# Patient Record
Sex: Female | Born: 1935 | Race: White | Hispanic: No | Smoking: Never smoker
Health system: Southern US, Community
[De-identification: ages and names within clinical notes are randomized; demographics above are authoritative.]

## PROBLEM LIST (undated history)

## (undated) DIAGNOSIS — S32010A Wedge compression fracture of first lumbar vertebra, initial encounter for closed fracture: Secondary | ICD-10-CM

## (undated) DIAGNOSIS — I499 Cardiac arrhythmia, unspecified: Secondary | ICD-10-CM

## (undated) DIAGNOSIS — Z9289 Personal history of other medical treatment: Secondary | ICD-10-CM

## (undated) DIAGNOSIS — I7121 Aneurysm of the ascending aorta, without rupture: Secondary | ICD-10-CM

## (undated) DIAGNOSIS — Z8543 Personal history of malignant neoplasm of ovary: Secondary | ICD-10-CM

## (undated) DIAGNOSIS — G4700 Insomnia, unspecified: Secondary | ICD-10-CM

## (undated) DIAGNOSIS — K219 Gastro-esophageal reflux disease without esophagitis: Secondary | ICD-10-CM

## (undated) DIAGNOSIS — S22009A Unspecified fracture of unspecified thoracic vertebra, initial encounter for closed fracture: Secondary | ICD-10-CM

## (undated) DIAGNOSIS — R030 Elevated blood-pressure reading, without diagnosis of hypertension: Secondary | ICD-10-CM

## (undated) DIAGNOSIS — Z9889 Other specified postprocedural states: Secondary | ICD-10-CM

## (undated) DIAGNOSIS — M1990 Unspecified osteoarthritis, unspecified site: Secondary | ICD-10-CM

## (undated) DIAGNOSIS — F419 Anxiety disorder, unspecified: Secondary | ICD-10-CM

## (undated) DIAGNOSIS — I7300 Raynaud's syndrome without gangrene: Secondary | ICD-10-CM

## (undated) DIAGNOSIS — Z1379 Encounter for other screening for genetic and chromosomal anomalies: Secondary | ICD-10-CM

## (undated) DIAGNOSIS — Z0389 Encounter for observation for other suspected diseases and conditions ruled out: Secondary | ICD-10-CM

## (undated) DIAGNOSIS — IMO0001 Reserved for inherently not codable concepts without codable children: Secondary | ICD-10-CM

## (undated) DIAGNOSIS — I712 Thoracic aortic aneurysm, without rupture: Secondary | ICD-10-CM

## (undated) DIAGNOSIS — C801 Malignant (primary) neoplasm, unspecified: Secondary | ICD-10-CM

## (undated) DIAGNOSIS — C50919 Malignant neoplasm of unspecified site of unspecified female breast: Secondary | ICD-10-CM

## (undated) HISTORY — PX: BREAST LUMPECTOMY: SHX2

## (undated) HISTORY — DX: Encounter for other screening for genetic and chromosomal anomalies: Z13.79

## (undated) HISTORY — DX: Unspecified fracture of unspecified thoracic vertebra, initial encounter for closed fracture: S22.009A

## (undated) HISTORY — DX: Other specified postprocedural states: Z98.890

## (undated) HISTORY — DX: Aneurysm of the ascending aorta, without rupture: I71.21

## (undated) HISTORY — DX: Insomnia, unspecified: G47.00

## (undated) HISTORY — DX: Gastro-esophageal reflux disease without esophagitis: K21.9

## (undated) HISTORY — DX: Personal history of other medical treatment: Z92.89

## (undated) HISTORY — PX: APPENDECTOMY: SHX54

## (undated) HISTORY — PX: ABDOMINAL HYSTERECTOMY: SHX81

## (undated) HISTORY — DX: Malignant (primary) neoplasm, unspecified: C80.1

## (undated) HISTORY — DX: Personal history of malignant neoplasm of ovary: Z85.43

## (undated) HISTORY — DX: Wedge compression fracture of first lumbar vertebra, initial encounter for closed fracture: S32.010A

## (undated) HISTORY — DX: Raynaud's syndrome without gangrene: I73.00

## (undated) HISTORY — PX: BILATERAL SALPINGOOPHORECTOMY: SHX1223

## (undated) HISTORY — DX: Thoracic aortic aneurysm, without rupture: I71.2

## (undated) HISTORY — DX: Cardiac arrhythmia, unspecified: I49.9

## (undated) HISTORY — DX: Unspecified osteoarthritis, unspecified site: M19.90

---

## 1977-09-22 DIAGNOSIS — Z8543 Personal history of malignant neoplasm of ovary: Secondary | ICD-10-CM

## 1977-09-22 HISTORY — DX: Personal history of malignant neoplasm of ovary: Z85.43

## 1977-09-22 HISTORY — PX: OTHER SURGICAL HISTORY: SHX169

## 1978-09-22 DIAGNOSIS — C801 Malignant (primary) neoplasm, unspecified: Secondary | ICD-10-CM

## 1978-09-22 HISTORY — DX: Malignant (primary) neoplasm, unspecified: C80.1

## 1997-12-12 ENCOUNTER — Other Ambulatory Visit: Admission: RE | Admit: 1997-12-12 | Discharge: 1997-12-12 | Payer: Self-pay | Admitting: *Deleted

## 1998-09-04 ENCOUNTER — Other Ambulatory Visit: Admission: RE | Admit: 1998-09-04 | Discharge: 1998-09-04 | Payer: Self-pay | Admitting: *Deleted

## 1998-10-04 ENCOUNTER — Ambulatory Visit (HOSPITAL_COMMUNITY): Admission: RE | Admit: 1998-10-04 | Discharge: 1998-10-04 | Payer: Self-pay | Admitting: Internal Medicine

## 1999-08-29 ENCOUNTER — Other Ambulatory Visit: Admission: RE | Admit: 1999-08-29 | Discharge: 1999-08-29 | Payer: Self-pay | Admitting: *Deleted

## 2000-07-08 ENCOUNTER — Encounter (INDEPENDENT_AMBULATORY_CARE_PROVIDER_SITE_OTHER): Payer: Self-pay | Admitting: Specialist

## 2000-07-08 ENCOUNTER — Other Ambulatory Visit: Admission: RE | Admit: 2000-07-08 | Discharge: 2000-07-08 | Payer: Self-pay | Admitting: Gastroenterology

## 2002-11-16 ENCOUNTER — Other Ambulatory Visit: Admission: RE | Admit: 2002-11-16 | Discharge: 2002-11-16 | Payer: Self-pay | Admitting: Obstetrics and Gynecology

## 2003-11-22 ENCOUNTER — Other Ambulatory Visit: Admission: RE | Admit: 2003-11-22 | Discharge: 2003-11-22 | Payer: Self-pay | Admitting: Obstetrics and Gynecology

## 2003-12-25 ENCOUNTER — Ambulatory Visit (HOSPITAL_COMMUNITY): Admission: RE | Admit: 2003-12-25 | Discharge: 2003-12-25 | Payer: Self-pay | Admitting: Obstetrics and Gynecology

## 2004-02-02 ENCOUNTER — Encounter: Admission: RE | Admit: 2004-02-02 | Discharge: 2004-02-02 | Payer: Self-pay | Admitting: Obstetrics and Gynecology

## 2004-08-13 ENCOUNTER — Ambulatory Visit: Payer: Self-pay | Admitting: Internal Medicine

## 2004-09-05 ENCOUNTER — Observation Stay (HOSPITAL_COMMUNITY): Admission: EM | Admit: 2004-09-05 | Discharge: 2004-09-06 | Payer: Self-pay | Admitting: Emergency Medicine

## 2004-09-05 ENCOUNTER — Ambulatory Visit: Payer: Self-pay | Admitting: Internal Medicine

## 2004-09-11 ENCOUNTER — Encounter: Payer: Self-pay | Admitting: Internal Medicine

## 2004-09-11 ENCOUNTER — Ambulatory Visit: Payer: Self-pay

## 2004-10-30 ENCOUNTER — Encounter: Admission: RE | Admit: 2004-10-30 | Discharge: 2004-10-30 | Payer: Self-pay | Admitting: Gastroenterology

## 2004-11-21 ENCOUNTER — Other Ambulatory Visit: Admission: RE | Admit: 2004-11-21 | Discharge: 2004-11-21 | Payer: Self-pay | Admitting: Obstetrics and Gynecology

## 2004-12-03 ENCOUNTER — Ambulatory Visit: Payer: Self-pay | Admitting: Internal Medicine

## 2005-01-02 ENCOUNTER — Ambulatory Visit: Payer: Self-pay | Admitting: Internal Medicine

## 2005-01-08 ENCOUNTER — Ambulatory Visit: Payer: Self-pay | Admitting: Internal Medicine

## 2005-01-24 ENCOUNTER — Ambulatory Visit: Payer: Self-pay | Admitting: Internal Medicine

## 2005-03-14 ENCOUNTER — Encounter: Admission: RE | Admit: 2005-03-14 | Discharge: 2005-03-14 | Payer: Self-pay | Admitting: Internal Medicine

## 2005-07-26 ENCOUNTER — Ambulatory Visit: Payer: Self-pay | Admitting: Internal Medicine

## 2005-10-01 ENCOUNTER — Ambulatory Visit: Payer: Self-pay | Admitting: Internal Medicine

## 2005-10-21 ENCOUNTER — Encounter: Admission: RE | Admit: 2005-10-21 | Discharge: 2005-10-21 | Payer: Self-pay | Admitting: Obstetrics and Gynecology

## 2005-10-30 ENCOUNTER — Ambulatory Visit: Payer: Self-pay | Admitting: Internal Medicine

## 2005-11-24 ENCOUNTER — Other Ambulatory Visit: Admission: RE | Admit: 2005-11-24 | Discharge: 2005-11-24 | Payer: Self-pay | Admitting: Obstetrics and Gynecology

## 2005-12-04 ENCOUNTER — Ambulatory Visit: Payer: Self-pay | Admitting: Internal Medicine

## 2005-12-17 ENCOUNTER — Ambulatory Visit: Payer: Self-pay | Admitting: Internal Medicine

## 2005-12-17 ENCOUNTER — Encounter: Admission: RE | Admit: 2005-12-17 | Discharge: 2005-12-17 | Payer: Self-pay | Admitting: Internal Medicine

## 2006-03-03 ENCOUNTER — Ambulatory Visit: Payer: Self-pay | Admitting: Internal Medicine

## 2006-06-02 ENCOUNTER — Encounter: Admission: RE | Admit: 2006-06-02 | Discharge: 2006-06-02 | Payer: Self-pay | Admitting: Obstetrics and Gynecology

## 2006-06-24 ENCOUNTER — Ambulatory Visit: Payer: Self-pay | Admitting: Internal Medicine

## 2006-10-01 ENCOUNTER — Ambulatory Visit: Payer: Self-pay | Admitting: Internal Medicine

## 2006-10-13 ENCOUNTER — Ambulatory Visit: Payer: Self-pay | Admitting: Internal Medicine

## 2006-10-14 ENCOUNTER — Ambulatory Visit: Payer: Self-pay | Admitting: Cardiology

## 2006-10-23 ENCOUNTER — Encounter: Admission: RE | Admit: 2006-10-23 | Discharge: 2006-10-23 | Payer: Self-pay | Admitting: Obstetrics and Gynecology

## 2006-11-02 ENCOUNTER — Ambulatory Visit: Payer: Self-pay | Admitting: Internal Medicine

## 2006-11-11 ENCOUNTER — Ambulatory Visit (HOSPITAL_COMMUNITY): Admission: RE | Admit: 2006-11-11 | Discharge: 2006-11-11 | Payer: Self-pay | Admitting: Internal Medicine

## 2006-11-18 ENCOUNTER — Ambulatory Visit (HOSPITAL_COMMUNITY): Admission: RE | Admit: 2006-11-18 | Discharge: 2006-11-18 | Payer: Self-pay | Admitting: Internal Medicine

## 2006-12-31 ENCOUNTER — Ambulatory Visit: Payer: Self-pay | Admitting: Internal Medicine

## 2006-12-31 LAB — CONVERTED CEMR LAB
Albumin: 3.8 g/dL (ref 3.5–5.2)
Alkaline Phosphatase: 50 units/L (ref 39–117)
BUN: 9 mg/dL (ref 6–23)
Bilirubin, Direct: 0.1 mg/dL (ref 0.0–0.3)
CO2: 33 meq/L — ABNORMAL HIGH (ref 19–32)
Calcium: 9.5 mg/dL (ref 8.4–10.5)
Creatinine, Ser: 0.5 mg/dL (ref 0.4–1.2)
GFR calc Af Amer: 156 mL/min
Total Bilirubin: 0.9 mg/dL (ref 0.3–1.2)
Total CHOL/HDL Ratio: 2.9
VLDL: 13 mg/dL (ref 0–40)

## 2007-01-20 ENCOUNTER — Ambulatory Visit: Payer: Self-pay | Admitting: Internal Medicine

## 2007-02-02 ENCOUNTER — Ambulatory Visit: Payer: Self-pay | Admitting: Internal Medicine

## 2007-02-02 LAB — CONVERTED CEMR LAB
Anti Nuclear Antibody(ANA): POSITIVE — AB
CRP, High Sensitivity: <1 — ABNORMAL LOW (ref 0.00–5.00)
Cyclic Citrullin Peptide Ab: 3 units (ref <20)
Rhuematoid fact SerPl-aCnc: <20 intl units/mL — ABNORMAL LOW (ref 0.0–20.0)

## 2007-02-04 ENCOUNTER — Ambulatory Visit: Payer: Self-pay

## 2007-02-04 ENCOUNTER — Encounter: Payer: Self-pay | Admitting: Cardiovascular Disease

## 2007-02-09 ENCOUNTER — Ambulatory Visit: Payer: Self-pay | Admitting: Surgery

## 2007-03-04 ENCOUNTER — Ambulatory Visit: Payer: Self-pay | Admitting: Internal Medicine

## 2007-03-18 DIAGNOSIS — M810 Age-related osteoporosis without current pathological fracture: Secondary | ICD-10-CM

## 2007-04-13 ENCOUNTER — Ambulatory Visit: Payer: Self-pay | Admitting: Internal Medicine

## 2007-04-13 LAB — CONVERTED CEMR LAB: IgE (Immunoglobulin E), Serum: 13.7 intl units/mL (ref 0.0–180.0)

## 2007-05-05 ENCOUNTER — Ambulatory Visit: Payer: Self-pay | Admitting: Internal Medicine

## 2007-05-06 ENCOUNTER — Ambulatory Visit: Payer: Self-pay | Admitting: Internal Medicine

## 2007-05-06 DIAGNOSIS — K219 Gastro-esophageal reflux disease without esophagitis: Secondary | ICD-10-CM

## 2007-05-06 DIAGNOSIS — I712 Thoracic aortic aneurysm, without rupture: Secondary | ICD-10-CM

## 2007-05-06 DIAGNOSIS — J309 Allergic rhinitis, unspecified: Secondary | ICD-10-CM | POA: Insufficient documentation

## 2007-05-06 DIAGNOSIS — M5030 Other cervical disc degeneration, unspecified cervical region: Secondary | ICD-10-CM

## 2007-05-11 ENCOUNTER — Encounter: Payer: Self-pay | Admitting: Internal Medicine

## 2007-07-01 ENCOUNTER — Ambulatory Visit: Payer: Self-pay | Admitting: Internal Medicine

## 2007-07-01 DIAGNOSIS — I7300 Raynaud's syndrome without gangrene: Secondary | ICD-10-CM | POA: Insufficient documentation

## 2007-07-16 ENCOUNTER — Telehealth (INDEPENDENT_AMBULATORY_CARE_PROVIDER_SITE_OTHER): Payer: Self-pay | Admitting: *Deleted

## 2007-07-19 ENCOUNTER — Encounter: Payer: Self-pay | Admitting: Internal Medicine

## 2007-07-27 ENCOUNTER — Ambulatory Visit: Payer: Self-pay | Admitting: Internal Medicine

## 2007-09-08 ENCOUNTER — Telehealth: Payer: Self-pay | Admitting: Internal Medicine

## 2007-09-27 ENCOUNTER — Ambulatory Visit: Payer: Self-pay | Admitting: Internal Medicine

## 2007-09-27 DIAGNOSIS — R0602 Shortness of breath: Secondary | ICD-10-CM | POA: Insufficient documentation

## 2007-09-27 LAB — CONVERTED CEMR LAB
Albumin: 3.9 g/dL (ref 3.5–5.2)
CO2: 30 meq/L (ref 19–32)
GFR calc Af Amer: 106 mL/min
GFR calc non Af Amer: 88 mL/min
Glucose, Bld: 91 mg/dL (ref 70–99)
Sodium: 140 meq/L (ref 135–145)

## 2007-10-07 ENCOUNTER — Ambulatory Visit: Payer: Self-pay | Admitting: Internal Medicine

## 2007-10-12 ENCOUNTER — Encounter: Payer: Self-pay | Admitting: Internal Medicine

## 2007-10-27 ENCOUNTER — Encounter: Payer: Self-pay | Admitting: Internal Medicine

## 2007-11-05 ENCOUNTER — Ambulatory Visit: Payer: Self-pay | Admitting: Internal Medicine

## 2007-11-05 ENCOUNTER — Telehealth: Payer: Self-pay | Admitting: Internal Medicine

## 2007-11-05 DIAGNOSIS — R079 Chest pain, unspecified: Secondary | ICD-10-CM

## 2007-11-18 ENCOUNTER — Encounter: Admission: RE | Admit: 2007-11-18 | Discharge: 2007-11-18 | Payer: Self-pay | Admitting: Obstetrics and Gynecology

## 2007-11-25 ENCOUNTER — Ambulatory Visit: Payer: Self-pay | Admitting: Internal Medicine

## 2007-11-25 DIAGNOSIS — R911 Solitary pulmonary nodule: Secondary | ICD-10-CM

## 2007-11-29 ENCOUNTER — Ambulatory Visit: Payer: Self-pay | Admitting: Internal Medicine

## 2007-12-07 ENCOUNTER — Other Ambulatory Visit: Admission: RE | Admit: 2007-12-07 | Discharge: 2007-12-07 | Payer: Self-pay | Admitting: Obstetrics and Gynecology

## 2007-12-21 ENCOUNTER — Encounter: Payer: Self-pay | Admitting: Internal Medicine

## 2007-12-21 ENCOUNTER — Ambulatory Visit: Payer: Self-pay | Admitting: Surgery

## 2008-01-27 ENCOUNTER — Ambulatory Visit: Payer: Self-pay | Admitting: Internal Medicine

## 2008-05-22 ENCOUNTER — Encounter: Payer: Self-pay | Admitting: Internal Medicine

## 2008-06-01 ENCOUNTER — Ambulatory Visit: Payer: Self-pay | Admitting: Internal Medicine

## 2008-06-01 ENCOUNTER — Telehealth: Payer: Self-pay | Admitting: Internal Medicine

## 2008-06-01 DIAGNOSIS — R300 Dysuria: Secondary | ICD-10-CM

## 2008-06-01 LAB — CONVERTED CEMR LAB
Bilirubin Urine: NEGATIVE
Ketones, urine, test strip: NEGATIVE
Nitrite: NEGATIVE
Specific Gravity, Urine: 1.015
Urobilinogen, UA: 0.2
pH: 7.5

## 2008-06-06 ENCOUNTER — Ambulatory Visit: Payer: Self-pay | Admitting: Internal Medicine

## 2008-06-06 LAB — CONVERTED CEMR LAB
ALT: 20 U/L
AST: 28 U/L
Albumin: 4.2 g/dL
Alkaline Phosphatase: 49 U/L
BUN: 13 mg/dL
Basophils Absolute: 0 K/uL
Basophils Relative: 0.8 %
Bilirubin, Direct: 0.1 mg/dL
CO2: 30 meq/L
Calcium: 9.6 mg/dL
Chloride: 109 meq/L
Cholesterol: 192 mg/dL
Creatinine, Ser: 0.8 mg/dL
Eosinophils Absolute: 0.2 K/uL
Eosinophils Relative: 3.8 %
GFR calc Af Amer: 91 mL/min
GFR calc non Af Amer: 75 mL/min
Glucose, Bld: 96 mg/dL
HCT: 40 %
HDL: 69.3 mg/dL
Hemoglobin: 13.5 g/dL
LDL Cholesterol: 115 mg/dL — ABNORMAL HIGH
Lymphocytes Relative: 35.8 %
MCHC: 33.7 g/dL
MCV: 85.6 fL
Monocytes Absolute: 0.5 K/uL
Monocytes Relative: 11 %
Neutro Abs: 2.1 K/uL
Neutrophils Relative %: 48.6 %
Platelets: 217 K/uL
Potassium: 4.7 meq/L
RBC: 4.67 M/uL
RDW: 12.8 %
Sodium: 139 meq/L
TSH: 1.01 u[IU]/mL
Total Bilirubin: 0.9 mg/dL
Total CHOL/HDL Ratio: 2.8
Total Protein: 7.6 g/dL
Triglycerides: 41 mg/dL
VLDL: 8 mg/dL
WBC: 4.4 10*3/microliter — ABNORMAL LOW

## 2008-06-12 ENCOUNTER — Ambulatory Visit: Payer: Self-pay | Admitting: Cardiovascular Disease

## 2008-06-27 ENCOUNTER — Ambulatory Visit: Payer: Self-pay | Admitting: Internal Medicine

## 2008-08-24 ENCOUNTER — Ambulatory Visit: Payer: Self-pay | Admitting: Internal Medicine

## 2008-08-24 DIAGNOSIS — B009 Herpesviral infection, unspecified: Secondary | ICD-10-CM | POA: Insufficient documentation

## 2008-09-21 ENCOUNTER — Telehealth: Payer: Self-pay | Admitting: *Deleted

## 2008-10-13 ENCOUNTER — Ambulatory Visit: Payer: Self-pay | Admitting: Internal Medicine

## 2008-10-13 DIAGNOSIS — K589 Irritable bowel syndrome without diarrhea: Secondary | ICD-10-CM | POA: Insufficient documentation

## 2008-10-13 DIAGNOSIS — F411 Generalized anxiety disorder: Secondary | ICD-10-CM

## 2008-10-13 LAB — CONVERTED CEMR LAB
ALT: 18 units/L (ref 0–35)
Alkaline Phosphatase: 50 units/L (ref 39–117)
Basophils Absolute: 0 10*3/uL (ref 0.0–0.1)
Eosinophils Relative: 3.8 % (ref 0.0–5.0)
Lipase: 42 units/L (ref 11.0–59.0)
MCHC: 33.6 g/dL (ref 30.0–36.0)
Monocytes Absolute: 0.6 10*3/uL (ref 0.1–1.0)
Neutrophils Relative %: 54.4 % (ref 43.0–77.0)
Platelets: 199 10*3/uL (ref 150–400)
RBC: 4.56 M/uL (ref 3.87–5.11)
RDW: 13 % (ref 11.5–14.6)
WBC: 5.7 10*3/uL (ref 4.5–10.5)

## 2008-11-10 ENCOUNTER — Telehealth: Payer: Self-pay | Admitting: Internal Medicine

## 2008-11-14 ENCOUNTER — Telehealth: Payer: Self-pay | Admitting: Internal Medicine

## 2008-11-14 ENCOUNTER — Ambulatory Visit: Payer: Self-pay | Admitting: Internal Medicine

## 2008-11-20 ENCOUNTER — Encounter: Admission: RE | Admit: 2008-11-20 | Discharge: 2008-11-20 | Payer: Self-pay | Admitting: Obstetrics and Gynecology

## 2008-12-04 ENCOUNTER — Encounter: Admission: RE | Admit: 2008-12-04 | Discharge: 2008-12-04 | Payer: Self-pay | Admitting: Surgery

## 2008-12-05 ENCOUNTER — Encounter: Payer: Self-pay | Admitting: Internal Medicine

## 2008-12-05 ENCOUNTER — Ambulatory Visit: Payer: Self-pay | Admitting: Surgery

## 2009-01-11 ENCOUNTER — Ambulatory Visit: Payer: Self-pay | Admitting: Internal Medicine

## 2009-02-02 ENCOUNTER — Ambulatory Visit: Payer: Self-pay | Admitting: Internal Medicine

## 2009-02-02 DIAGNOSIS — M19019 Primary osteoarthritis, unspecified shoulder: Secondary | ICD-10-CM | POA: Insufficient documentation

## 2009-02-02 LAB — CONVERTED CEMR LAB: Cyclic Citrullin Peptide Ab: 1.6 U

## 2009-02-08 ENCOUNTER — Telehealth: Payer: Self-pay | Admitting: Internal Medicine

## 2009-05-25 ENCOUNTER — Ambulatory Visit: Payer: Self-pay | Admitting: Internal Medicine

## 2009-05-25 LAB — CONVERTED CEMR LAB
ALT: 18 units/L (ref 0–35)
Albumin: 3.9 g/dL (ref 3.5–5.2)
BUN: 14 mg/dL (ref 6–23)
CO2: 31 meq/L (ref 19–32)
Calcium: 9.6 mg/dL (ref 8.4–10.5)
Chloride: 104 meq/L (ref 96–112)
Cholesterol: 198 mg/dL (ref 0–200)
Creatinine, Ser: 0.7 mg/dL (ref 0.4–1.2)
Eosinophils Relative: 3.8 % (ref 0.0–5.0)
Glucose, Bld: 86 mg/dL (ref 70–99)
HDL: 67.6 mg/dL (ref >39.00)
LDL Cholesterol: 125 mg/dL — ABNORMAL HIGH (ref 0–99)
Lymphocytes Relative: 40.4 % (ref 12.0–46.0)
MCV: 86.9 fL (ref 78.0–100.0)
Monocytes Absolute: 0.4 10*3/uL (ref 0.1–1.0)
Neutrophils Relative %: 44.8 % (ref 43.0–77.0)
Nitrite: NEGATIVE
Platelets: 189 10*3/uL (ref 150.0–400.0)
Protein, U semiquant: NEGATIVE
TSH: 1.28 microintl units/mL (ref 0.35–5.50)
Total Protein: 7 g/dL (ref 6.0–8.3)
Triglycerides: 29 mg/dL (ref 0.0–149.0)
WBC Urine, dipstick: NEGATIVE
WBC: 4.1 10*3/uL — ABNORMAL LOW (ref 4.5–10.5)

## 2009-06-05 ENCOUNTER — Ambulatory Visit: Payer: Self-pay | Admitting: Internal Medicine

## 2009-07-11 ENCOUNTER — Ambulatory Visit: Payer: Self-pay | Admitting: Internal Medicine

## 2009-09-05 ENCOUNTER — Ambulatory Visit: Payer: Self-pay | Admitting: Internal Medicine

## 2009-09-05 DIAGNOSIS — M1710 Unilateral primary osteoarthritis, unspecified knee: Secondary | ICD-10-CM

## 2009-09-06 ENCOUNTER — Encounter: Admission: RE | Admit: 2009-09-06 | Discharge: 2009-09-06 | Payer: Self-pay | Admitting: Obstetrics and Gynecology

## 2009-11-21 ENCOUNTER — Encounter: Admission: RE | Admit: 2009-11-21 | Discharge: 2009-11-21 | Payer: Self-pay | Admitting: Obstetrics and Gynecology

## 2009-11-22 ENCOUNTER — Telehealth (INDEPENDENT_AMBULATORY_CARE_PROVIDER_SITE_OTHER): Payer: Self-pay | Admitting: *Deleted

## 2009-11-23 ENCOUNTER — Ambulatory Visit: Payer: Self-pay | Admitting: Internal Medicine

## 2009-11-23 LAB — CONVERTED CEMR LAB
Bilirubin Urine: NEGATIVE
Ketones, urine, test strip: NEGATIVE
Protein, U semiquant: NEGATIVE
Urobilinogen, UA: 0.2

## 2009-12-05 ENCOUNTER — Ambulatory Visit (HOSPITAL_COMMUNITY): Admission: RE | Admit: 2009-12-05 | Discharge: 2009-12-05 | Payer: Self-pay | Admitting: Obstetrics and Gynecology

## 2009-12-13 ENCOUNTER — Ambulatory Visit: Payer: Self-pay | Admitting: Internal Medicine

## 2009-12-13 DIAGNOSIS — R5381 Other malaise: Secondary | ICD-10-CM | POA: Insufficient documentation

## 2009-12-13 DIAGNOSIS — R5383 Other fatigue: Secondary | ICD-10-CM

## 2009-12-13 LAB — CONVERTED CEMR LAB
Basophils Relative: 0.3 % (ref 0.0–3.0)
Eosinophils Relative: 5.5 % — ABNORMAL HIGH (ref 0.0–5.0)
HCT: 38.8 % (ref 36.0–46.0)
Lymphs Abs: 1.9 10*3/uL (ref 0.7–4.0)
MCV: 88.4 fL (ref 78.0–100.0)
Monocytes Absolute: 0.6 10*3/uL (ref 0.1–1.0)
RBC: 4.39 M/uL (ref 3.87–5.11)
WBC: 6.5 10*3/uL (ref 4.5–10.5)

## 2009-12-18 ENCOUNTER — Other Ambulatory Visit: Admission: RE | Admit: 2009-12-18 | Discharge: 2009-12-18 | Payer: Self-pay | Admitting: Obstetrics and Gynecology

## 2010-01-18 ENCOUNTER — Ambulatory Visit: Payer: Self-pay | Admitting: Internal Medicine

## 2010-01-18 DIAGNOSIS — M542 Cervicalgia: Secondary | ICD-10-CM

## 2010-01-24 ENCOUNTER — Encounter: Admission: RE | Admit: 2010-01-24 | Discharge: 2010-01-24 | Payer: Self-pay | Admitting: Internal Medicine

## 2010-01-24 ENCOUNTER — Encounter: Payer: Self-pay | Admitting: Internal Medicine

## 2010-06-27 ENCOUNTER — Telehealth: Payer: Self-pay | Admitting: Internal Medicine

## 2010-06-28 ENCOUNTER — Ambulatory Visit: Payer: Self-pay | Admitting: Internal Medicine

## 2010-06-28 DIAGNOSIS — M545 Low back pain: Secondary | ICD-10-CM | POA: Insufficient documentation

## 2010-06-28 DIAGNOSIS — R109 Unspecified abdominal pain: Secondary | ICD-10-CM | POA: Insufficient documentation

## 2010-06-28 LAB — CONVERTED CEMR LAB
Bilirubin Urine: NEGATIVE
Ketones, urine, test strip: NEGATIVE
Protein, U semiquant: NEGATIVE
Urobilinogen, UA: 0.2

## 2010-07-01 ENCOUNTER — Telehealth: Payer: Self-pay | Admitting: Internal Medicine

## 2010-07-02 ENCOUNTER — Ambulatory Visit: Payer: Self-pay | Admitting: Internal Medicine

## 2010-07-02 LAB — CONVERTED CEMR LAB
CO2: 31 meq/L (ref 19–32)
GFR calc non Af Amer: 82.71 mL/min (ref >60)
Glucose, Bld: 105 mg/dL — ABNORMAL HIGH (ref 70–99)
Potassium: 4.4 meq/L (ref 3.5–5.1)
Sodium: 132 meq/L — ABNORMAL LOW (ref 135–145)

## 2010-07-03 ENCOUNTER — Ambulatory Visit: Payer: Self-pay | Admitting: Cardiovascular Disease

## 2010-07-04 ENCOUNTER — Telehealth: Payer: Self-pay | Admitting: Internal Medicine

## 2010-07-24 ENCOUNTER — Ambulatory Visit: Payer: Self-pay | Admitting: Internal Medicine

## 2010-07-24 DIAGNOSIS — S22009A Unspecified fracture of unspecified thoracic vertebra, initial encounter for closed fracture: Secondary | ICD-10-CM

## 2010-07-24 HISTORY — DX: Unspecified fracture of unspecified thoracic vertebra, initial encounter for closed fracture: S22.009A

## 2010-07-24 LAB — CONVERTED CEMR LAB
Basophils Absolute: 0 10*3/uL (ref 0.0–0.1)
Basophils Relative: 0.8 % (ref 0.0–3.0)
HCT: 39.9 % (ref 36.0–46.0)
Hemoglobin: 13.4 g/dL (ref 12.0–15.0)
Lymphs Abs: 1.7 10*3/uL (ref 0.7–4.0)
Monocytes Relative: 9.1 % (ref 3.0–12.0)
Neutro Abs: 2.1 10*3/uL (ref 1.4–7.7)
RDW: 14.6 % (ref 11.5–14.6)

## 2010-07-30 ENCOUNTER — Encounter: Payer: Self-pay | Admitting: Internal Medicine

## 2010-07-30 ENCOUNTER — Encounter
Admission: RE | Admit: 2010-07-30 | Discharge: 2010-09-04 | Payer: Self-pay | Source: Home / Self Care | Attending: Internal Medicine | Admitting: Internal Medicine

## 2010-08-13 ENCOUNTER — Encounter: Payer: Self-pay | Admitting: Internal Medicine

## 2010-09-27 ENCOUNTER — Ambulatory Visit: Admit: 2010-09-27 | Payer: Self-pay | Admitting: Internal Medicine

## 2010-10-01 ENCOUNTER — Encounter
Admission: RE | Admit: 2010-10-01 | Discharge: 2010-10-01 | Payer: Self-pay | Source: Home / Self Care | Attending: Obstetrics and Gynecology | Admitting: Obstetrics and Gynecology

## 2010-10-12 ENCOUNTER — Other Ambulatory Visit: Payer: Self-pay | Admitting: Obstetrics and Gynecology

## 2010-10-12 DIAGNOSIS — Z1239 Encounter for other screening for malignant neoplasm of breast: Secondary | ICD-10-CM

## 2010-10-20 LAB — CONVERTED CEMR LAB
Basophils Absolute: 0 10*3/uL (ref 0.0–0.1)
Basophils Relative: 0.7 % (ref 0.0–1.0)
CO2: 31 meq/L (ref 19–32)
Calcium: 10.1 mg/dL (ref 8.4–10.5)
Chloride: 101 meq/L (ref 96–112)
Eosinophils Absolute: 0.2 10*3/uL (ref 0.0–0.6)
GFR calc non Af Amer: 105 mL/min
Glucose, Bld: 92 mg/dL (ref 70–99)
Hemoglobin: 13 g/dL (ref 12.0–15.0)
MCHC: 33 g/dL (ref 30.0–36.0)
Neutro Abs: 2.6 10*3/uL (ref 1.4–7.7)
Neutrophils Relative %: 49.7 % (ref 43.0–77.0)
Total CK: 81 units/L (ref 7–177)
WBC: 5.2 10*3/uL (ref 4.5–10.5)

## 2010-10-22 NOTE — Assessment & Plan Note (Signed)
Summary: has burning sensation in mouth and lips/cjr   Vital Signs:  Patient profile:   77 year old female Weight:      146 pounds Temp:     98.2 degrees F oral Pulse rate:   74 / minute BP sitting:   118 / 78  (left arm) Cuff size:   regular  Vitals Entered By: Kern Reap CMA Duncan Dull) (December 13, 2009 12:18 PM) CC: lips, front teeth burning, troubles with breathing, sinus drainage Is Patient Diabetic? No   Primary Care Provider:  Terrilee Files  CC:  lips, front teeth burning, troubles with breathing, and sinus drainage.  History of Present Illness: Has a long hx of buring in mouth and lips has seen the dentist and was evaluated and he could  not find any problem feels fatigues Has seen the ENT ( [facility]) and she was place on an antibiotic and maxifed and it did not help she was then given avelox  and gave a probiotic the ENT suggested zyrtec has been on the zyrtec several days and it helps  Problems Prior to Update: 1)  Osteoarthrosis Unspec Whether Gen/loc Lower Leg  (ICD-715.96) 2)  Osteoarthros Unspec Whether Gen/loc [employer] [facility]  (ICD-715.91) 3)  [place]  (ICD-465.9) 4)  Generalized Anxiety Disorder  (ICD-300.02) 5)  Abdominal or Pelvic Swelling Mass or Lump Llq  (ICD-789.34) 6)  Irritable Bowel Syndrome  (ICD-564.1) 7)  Herpes Simplex Infection  (ICD-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 Take 1 Tablet By Mouth Once A Day 2)  Nexium 40 Mg  Cpdr (Esomeprazole Magnesium) .... Take 1 Tablet By  Mouth Once A Day As Needed 3)  Multivitamin .... Take 1 Tablet By Mouth Once A Day 4)  Gnp Flax Seed Oil 1000 Mg Caps (Flaxseed (Linseed)) .Marland Kitchen.. 1 Once Daily 5)  Valtrex 500 Mg  Tabs (Valacyclovir Hcl) .... One By Mouth Daily As Needed 6)  Vitamin D 1000 Unit  Caps (Cholecalciferol) .... Once Daily 7)  Ipratropium Bromide 0.03 % Soln (Ipratropium Bromide) .... Two Spray in Nostril Two Times A Day  Prn  Current Medications (verified): 1)  Calcium/vitamin D .... Take 1 Tablet By Mouth Once A Day 2)  Nexium 40 Mg  Cpdr (Esomeprazole Magnesium) .... Take 1 Tablet By Mouth Once A Day As Needed 3)  Multivitamin .... Take 1 Tablet By Mouth Once A Day 4)  Gnp Flax Seed Oil 1000 Mg Caps (Flaxseed (Linseed)) .Marland Kitchen.. 1 Once Daily 5)  Valtrex 500 Mg  Tabs (Valacyclovir Hcl) .... One By Mouth Daily As Needed 6)  Vitamin D 1000 Unit  Caps (Cholecalciferol) .... Once Daily 7)  Ipratropium Bromide 0.03 % Soln (Ipratropium Bromide) .... Two Spray in Nostril Two Times A Day  Prn 8)  Zyrtec Allergy 10 Mg Tbdp (Cetirizine Hcl) .... One By Mouth Daily 9)  Sertraline Hcl 25 Mg Tabs (Sertraline Hcl) .... One By Mouth Daily  Allergies (verified): No Known Drug Allergies  Past History:  Family History: Last updated: 06/05/2009 Father age related issues Mother alive at  99 [facility] sister had connective tissue dz  Social History: Last updated: 03/18/2007 Married Never Smoked Retired  Risk Factors: Smoking Status: never (09/05/2009)  Past medical, surgical, family and social histories (including risk factors) reviewed, and no changes noted (except as noted below).  Past Medical History: Reviewed history from 06/06/2008 and no changes required. Osteoporosis Ascending Aortic Aneurysm hx of Ovarian CA raynauds GERD insomnia  Past Surgical History: Reviewed history from 05/04/2007 and no changes required. Ovarian CA S/P TAH/BSO-1979 Appendectomy Hysterectomy-1980 Bilateral  Salpingo-OOphorectomy-1980 Colonoscopy  Family History: Reviewed history from 06/05/2009 and no changes required. Father age related issues Mother alive at 79 [facility] sister had connective tissue dz  Social History: Reviewed history from 03/18/2007 and no changes required. Married Never Smoked Retired  Review of Systems  The patient denies anorexia, fever, weight loss, weight gain, vision loss, decreased hearing, hoarseness, chest pain, syncope, dyspnea on exertion, peripheral edema, prolonged cough, headaches, hemoptysis, abdominal pain, melena, hematochezia, severe indigestion/heartburn, hematuria, incontinence, genital sores, muscle weakness, suspicious skin lesions, transient blindness, difficulty walking, depression, unusual weight change, abnormal bleeding, enlarged lymph nodes, angioedema, and breast masses.    Physical Exam  General:  Well-developed,well-nourished,in no acute distress; alert,appropriate and cooperative throughout examination  Head:  Normocephalic and atraumatic without obvious abnormalities. No apparent alopecia or balding. Eyes:  pupils equal and pupils round.   Ears:  R ear normal and L ear normal.   Mouth:  posterior lymphoid hypertrophy and postnasal drip.   Neck:  No deformities, masses, or tenderness noted. Lungs:  normal respiratory effort and no dullness.   Heart:  normal rate and regular rhythm.   Skin:  Intact without suspicious lesions or rashes Cervical Nodes:  No lymphadenopathy noted Axillary Nodes:  No palpable lymphadenopathy Psych:  subdued and slightly anxious.     Impression & Recommendations:  Problem # 1:  ALLERGIC RHINITIS, CHRONIC (ICD-477.9)  Her updated medication list for this problem includes:    Ipratropium Bromide 0.03 % Soln (Ipratropium bromide) .Marland Kitchen..Marland Kitchen Two spray in nostril two times a day  prn    Zyrtec Allergy 10 Mg Tbdp (Cetirizine hcl) ..... One by mouth daily  Discussed use of allergy medications and  environmental measures.   Problem # 2:  FATIGUE, CHRONIC (ICD-780.79)  Orders: Venipuncture (16109) TLB-CBC Platelet - w/Differential (85025-CBCD) TLB-B12 + Folate Pnl (60454_09811-B14/NWG) TLB-CRP-High Sensitivity (C-Reactive Protein) (86140-FCRP)  Problem # 3:  GENERALIZED ANXIETY DISORDER (ICD-300.02)  Discussed medication use and relaxation techniques.   Her updated medication list for this problem includes:    Sertraline Hcl 25 Mg Tabs (Sertraline hcl) ..... One by mouth daily  Complete Medication List: 1)  Calcium/vitamin D  .... Take 1 tablet by mouth once a day 2)  Nexium 40 Mg Cpdr (Esomeprazole magnesium) .... Take 1 tablet by mouth once a day as needed 3)  Multivitamin  .... Take 1 tablet by mouth once a day 4)  Gnp Flax Seed Oil 1000 Mg Caps (Flaxseed (linseed)) .Marland Kitchen.. 1 once daily 5)  Valtrex 500 Mg Tabs (Valacyclovir hcl) .... One by mouth daily as needed 6)  Vitamin D 1000 Unit Caps (Cholecalciferol) .... Once daily 7)  Ipratropium Bromide 0.03 % Soln (Ipratropium bromide) .... Two spray in nostril two times a day  prn 8)  Zyrtec Allergy 10 Mg Tbdp (Cetirizine hcl) .... One by mouth daily 9)  Sertraline Hcl 25 Mg Tabs (Sertraline hcl) .... One by mouth daily  Patient Instructions: 1)  keep apointment Prescriptions: SERTRALINE HCL 25 MG TABS (  SERTRALINE HCL) one by mouth daily  #30 x 5   Entered and Authorized by:   Stacie Glaze MD   Signed by:   Stacie Glaze MD on 12/13/2009   Method used:   Electronically to        [facility] [phone]* (retail)       [address]       [place], [place]  [address]       Ph: [phone] or [phone]       Fax: [phone]   RxID:   [id number]

## 2010-10-22 NOTE — Assessment & Plan Note (Signed)
Summary: abd pain/bmw   Vital Signs:  Patient profile:   77 year old female Weight:      146 pounds Temp:     98.3 degrees F oral BP sitting:   110 / 70  (left arm) Cuff size:   regular  Vitals Entered By: Kern Reap CMA Duncan Dull) (June 28, 2010 3:40 PM) CC: lower abdominal and lower back pain Is Patient Diabetic? No Pain Assessment Patient in pain? yes     Location: abdomen Intensity: 7 Type: sharp Onset of pain  With activity   Primary Care Provider:  Terrilee Files  CC:  lower abdominal and lower back pain.  History of Present Illness: has increased LLQ pain started two weeks ago with back pain and now it has moved to the LLQ urine obtained Went to GYN and she examined her and saw no ovarian or pelvic mass she wanted to order a CT but she has not done this yet ( not scheduled) Burning pain, feels like the lower abdomin is swelling bowels move with magnesium glycinate 400 using OTC digestive enzymes  Preventive Screening-Counseling & Management  Alcohol-Tobacco     Smoking Status: never     Tobacco Counseling: not indicated; no tobacco use  Problems Prior to Update: 1)  Back Pain, Lumbar  (ICD-724.2) 2)  Abdominal Pain, Lower  (ICD-789.09) 3)  Neck and Back Pain  (ICD-723.1) 4)  Fatigue, Chronic  (ICD-780.79) 5)  Osteoarthrosis Unspec Whether Gen/loc Lower Leg  (ICD-715.96) 6)  Osteoarthros Unspec Whether Gen/loc [employer] [facility]  (ICD-715.91) 7)  [place]  (ICD-465.9) 8)  Generalized Anxiety Disorder  (ICD-300.02) 9)  Abdominal or Pelvic Swelling Mass or Lump Llq  (ICD-789.34) 10)  Irritable Bowel Syndrome  (ICD-564.1) 11)  Herpes Simplex Infection  (ICD-054.9) 12)  Preventive Health Care  (ICD-V70.0) 13)  Gerd  (ICD-530.81) 14)  Dysuria  (ICD-788.1) 15)  Lung Nodule  (ICD-518.89) 16)  Chest Pain  (ICD-786.50) 17)  Dyspnea  (ICD-786.05) 18)  Raynaud's Syndrome  (ICD-443.0) 19)  Degenerative Disc Disease, Cervical Spine  (ICD-722.4) 20)  Aneurysm, Thoracic  Aortic  (ICD-441.2) 21)  Gastroesophageal Reflux Disease, Chronic  (ICD-530.81) 22)  Allergic Rhinitis, Chronic  (ICD-477.9) 23)  Osteoporosis  (ICD-733.00)  Medications Prior to Update: 1)  Calcium/vitamin D .... Take 1 Tablet By Mouth Once A Day 2)  Nexium 40 Mg  Cpdr (Esomeprazole Magnesium) .... Take 1 Tablet By Mouth Once A Day As Needed 3)  Multivitamin .... Take 1 Tablet By Mouth Once A Day 4)  Gnp Flax Seed Oil 1000 Mg Caps (Flaxseed (Linseed)) .Marland Kitchen.. 1 Once Daily 5)  Valtrex 500 Mg  Tabs (Valacyclovir Hcl) .... One By Mouth Daily As Needed 6)  Vitamin D 1000 Unit  Caps (Cholecalciferol) .... Once Daily 7)  Ipratropium Bromide 0.03 % Soln (Ipratropium Bromide) .... Two Spray in Nostril Two Times A Day  Prn 8)  Zyrtec Allergy 10 Mg Tbdp (Cetirizine Hcl) .... One By Mouth Daily 9)  Sertraline Hcl 25 Mg Tabs (Sertraline Hcl) .... One By Mouth Daily 10)  Silenor 6 Mg Tabs (Doxepin Hcl) .... One By Mouth Q Hs  Current Medications (verified): 1)  Calcium/vitamin D .... Take 1 Tablet By Mouth Once A Day 2)  Nexium 40 Mg  Cpdr (Esomeprazole Magnesium) .... Take 1 Tablet By Mouth Once A Day As Needed 3)  Multivitamin .... Take 1 Tablet By Mouth Once A Day 4)  Gnp Flax Seed Oil 1000 Mg Caps (Flaxseed (Linseed)) .Marland Kitchen.. 1 Once Daily 5)  Valtrex 500 Mg  Tabs (Valacyclovir Hcl) .... One By Mouth Daily As Needed 6)  Vitamin D 1000 Unit  Caps (Cholecalciferol) .... Once Daily 7)  Zyrtec Allergy 10 Mg Tbdp (Cetirizine Hcl) .... One By Mouth Daily 8)  Sertraline Hcl 25 Mg Tabs (Sertraline Hcl) .... One By Mouth Daily  Allergies (verified): No Known Drug Allergies  Past History:  Family History: Last updated: 06/05/2009 Father age related issues Mother alive at 9 [facility] sister had connective tissue dz  Social History: Last updated: 03/18/2007 Married Never Smoked Retired  Risk Factors: Smoking Status: never (06/28/2010)  Past medical, surgical, family and social histories  (including risk factors) reviewed, and no changes noted (except as noted below).  Past Medical History: Reviewed history from 06/06/2008 and no changes required. Osteoporosis Ascending Aortic Aneurysm hx of Ovarian CA raynauds GERD insomnia  Past Surgical History: Reviewed history from 05/04/2007 and no changes required. Ovarian CA S/P TAH/BSO-1979 Appendectomy Hysterectomy-1980 Bilateral Salpingo-OOphorectomy-1980 Colonoscopy  Family History: Reviewed history from 06/05/2009 and no changes required. Father age related issues Mother alive at 40 [facility] sister had connective tissue dz  Social History: Reviewed history from 03/18/2007 and no changes required. Married Never Smoked Retired  Review of Systems       The patient complains of abdominal pain.  The patient denies anorexia, fever, weight loss, weight gain, vision loss, decreased hearing, hoarseness, chest pain, syncope, dyspnea on exertion, peripheral edema, prolonged cough, headaches, hemoptysis, melena, hematochezia, severe indigestion/heartburn, hematuria, incontinence, genital sores, muscle weakness, suspicious skin lesions, transient blindness, difficulty walking, depression, unusual weight change, abnormal bleeding, enlarged lymph nodes, angioedema, and breast masses.         Flu Vaccine Consent Questions     Do you have a history of severe allergic reactions to this vaccine? no    Any prior history of allergic reactions to egg and/or gelatin? no    Do you have a sensitivity to the preservative Thimersol? no    Do you have a past history of Guillan-Barre Syndrome? no    Do you currently have an acute febrile illness? no    Have you ever had a severe reaction to latex? no    Vaccine information given and explained to patient? yes    Are you currently pregnant? no    Lot Number:[id number]   Exp Date:03/22/2011   Site Given  Left Deltoid   Physical Exam  General:  Well-developed,well-nourished,in no  acute distress; alert,appropriate and cooperative throughout examination  Head:  Normocephalic and atraumatic without obvious abnormalities. No apparent alopecia or balding. Eyes:  pupils equal and pupils round.   Ears:  R ear normal and L ear normal.   Nose:  nasal dischargemucosal pallor and mucosal edema.   Mouth:  posterior lymphoid hypertrophy and postnasal drip.   Neck:  No deformities, masses, or tenderness noted. Lungs:  normal respiratory effort and no dullness.   Heart:  normal rate and regular rhythm.   Abdomen:  normal bowel sounds and no distention.  soft but pain is in the left lower quadrat she has a scar in the RLQ fro appendectomy and a mide line scsar from prior ovary surgery Msk:  normal ROM, no redness over joints, no joint deformities, and no joint instability.   Extremities:  No clubbing, cyanosis, edema, or deformity noted with normal full range of motion of all joints.   Neurologic:  No cranial nerve deficits noted. Station and gait are normal. Plantar reflexes are down-going bilaterally. DTRs are  symmetrical throughout. Sensory, motor and coordinative functions appear intact.   Impression & Recommendations:  Problem # 1:  BACK PAIN, LUMBAR (ICD-724.2)  Orders: UA Dipstick w/o Micro (automated)  (81003)  Problem # 2:  ABDOMINAL PAIN, LOWER (ICD-789.09)  the pain is located in the LLQ but there is no focal tenderness on exam I cannot detect any wall weakness or hernia she is using harseh laxitives to keep her bowels se is not septic urine is clear Orders: UA Dipstick w/o Micro (automated)  (81003) trial fo cipri 500 two times a day and flagyl 500 two times a day for 1 week with CT of abd and pelvis if not imroved over the weekend Discussed use of medications, application of heat or cold, and exercises.   Complete Medication List: 1)  Calcium/vitamin D  .... Take 1 tablet by mouth once a day 2)  Nexium 40 Mg Cpdr (Esomeprazole magnesium) .... Take 1 tablet  by mouth once a day as needed 3)  Multivitamin  .... Take 1 tablet by mouth once a day 4)  Gnp Flax Seed Oil 1000 Mg Caps (Flaxseed (linseed)) .Marland Kitchen.. 1 once daily 5)  Valtrex 500 Mg Tabs (Valacyclovir hcl) .... One by mouth daily as needed 6)  Vitamin D 1000 Unit Caps (Cholecalciferol) .... Once daily 7)  Zyrtec Allergy 10 Mg Tbdp (Cetirizine hcl) .... One by mouth daily 8)  Sertraline Hcl 25 Mg Tabs (Sertraline hcl) .... One by mouth daily 9)  Ciprofloxacin Hcl 500 Mg Tabs (Ciprofloxacin hcl) .... One  by mouth two times a day 10)  Metronidazole 500 Mg Tabs (Metronidazole) .... One by mouth two times a day for 7 days  Other Orders: Flu Vaccine 65yrs + MEDICARE PATIENTS (E7035) Administration Flu vaccine - MCR (K0938)  Patient Instructions: 1)  complete the antibiotics called in 2)  if the pan lessens over the weekend call to report 3)  if the pain continues we wil order a CT next week Prescriptions: METRONIDAZOLE 500 MG TABS (METRONIDAZOLE) one by mouth two times a day for 7 days  #14 x 0   Entered and Authorized by:   Stacie Glaze MD   Signed by:   Stacie Glaze MD on 06/28/2010   Method used:   Electronically to        [facility] [phone]* (retail)       [address]       [place], [place]  [address]       Ph: [phone] or [phone]       Fax: [phone]   RxID:   [id number] CIPROFLOXACIN HCL 500 MG TABS (CIPROFLOXACIN HCL) one  by mouth two times a day  #14 x 0   Entered and Authorized by:   Stacie Glaze MD   Signed by:   Stacie Glaze MD on 06/28/2010   Method used:   Electronically to        [facility] [phone]* (retail)       [address]       [place], [place]  [address]       Ph: [phone] or [phone]       Fax: [phone]   RxID:   [id number]   Laboratory Results   Urine Tests  Date/Time Recieved: June 28, 2010 3:56 PM  Date/Time Reported: June 28, 2010  3:56 PM   Routine Urinalysis   Color: yellow Appearance: Clear Glucose: negative   (Normal Range: Negative) Bilirubin: negative   (Normal Range: Negative) Ketone: negative   (Normal Range: Negative) Spec. Gravity: 1.010   (Normal Range: 1.003-1.035) Blood: negative   (Normal Range: Negative) pH: 6.0   (Normal Range: 5.0-8.0) Protein: negative   (Normal Range: Negative) Urobilinogen: 0.2   (Normal Range: 0-1) Nitrite: negative   (Normal Range: Negative) Leukocyte Esterace: negative   (Normal Range: Negative)    Comments: Wynona Canes, CMA  June 28, 2010 3:56 PM

## 2010-10-22 NOTE — Assessment & Plan Note (Signed)
Summary: 4 MTH ROV // RS   Vital Signs:  Patient profile:   77 year old female Height:      66 inches Weight:      145 pounds BMI:     23.49 Temp:     98.2 degrees F oral Pulse rate:   72 / minute Resp:     14 per minute BP sitting:   120 / 70  (left arm)  Vitals Entered By: Willy Eddy, LPN (January 18, 2010 8:12 AM) CC: roa-labs- c/o seasonal allergies and cough, Abdominal Pain   Primary Care Provider:  Terrilee Files  CC:  roa-labs- c/o seasonal allergies and cough and Abdominal Pain.  History of Present Illness: neck and shoulder pain multiple joint pain tension in neck has gone to PT and massage insomnia is the main issue in her stress  Dyspepsia History:      She has no alarm features of dyspepsia including no history of melena, hematochezia, dysphagia, persistent vomiting, or involuntary weight loss > 5%.  There is a prior history of GERD.  The patient does not have a prior history of documented ulcer disease.  She has a history of a positive H. Pylori serology.     Preventive Screening-Counseling & Management  Alcohol-Tobacco     Smoking Status: never  Problems Prior to Update: 1)  Fatigue, Chronic  (ICD-780.79) 2)  Osteoarthrosis Unspec Whether Gen/loc Lower Leg  (ICD-715.96) 3)  Osteoarthros Unspec Whether Gen/loc [employer] [place]  (ICD-715.91) 4)  Uri  (ICD-465.9) 5)  Generalized Anxiety Disorder  (ICD-300.02) 6)  Abdominal or Pelvic Swelling Mass or Lump Llq  (ICD-789.34) 7)  Irritable Bowel Syndrome  (ICD-564.1) 8)  Herpes Simplex Infection  (ICD-054.9) 9)  Preventive Health Care  (ICD-V70.0) 10)  Gerd  (ICD-530.81) 11)  Dysuria  (ICD-788.1) 12)  Lung Nodule  (ICD-518.89) 13)  Chest Pain  (ICD-786.50) 14)  Dyspnea  (ICD-786.05) 15)  Raynaud's Syndrome  (ICD-443.0) 16)  Degenerative Disc Disease, Cervical Spine  (ICD-722.4) 17)  Aneurysm, Thoracic Aortic  (ICD-441.2) 18)  Gastroesophageal Reflux Disease, Chronic  (ICD-530.81) 19)  Allergic Rhinitis,  Chronic  (ICD-477.9) 20)  Osteoporosis  (ICD-733.00)  Current Problems (verified): 1)  Fatigue, Chronic  (ICD-780.79) 2)  Osteoarthrosis Unspec Whether Gen/loc Lower Leg  (ICD-715.96) 3)  Osteoarthros Unspec Whether Gen/loc [employer] [place]  (ICD-715.91) 4)  Uri  (ICD-465.9) 5)  Generalized Anxiety Disorder  (ICD-300.02) 6)  Abdominal or Pelvic Swelling Mass or Lump Llq  (ICD-789.34) 7)  Irritable Bowel Syndrome  (ICD-564.1) 8)  Herpes Simplex Infection  (ICD-054.9) 9)  Preventive Health Care  (ICD-V70.0) 10)  Gerd  (ICD-530.81) 11)  Dysuria  (ICD-788.1) 12)  Lung Nodule  (ICD-518.89) 13)  Chest Pain  (ICD-786.50) 14)  Dyspnea  (ICD-786.05) 15)  Raynaud's Syndrome  (ICD-443.0) 16)  Degenerative Disc Disease, Cervical Spine  (ICD-722.4) 17)  Aneurysm, Thoracic Aortic  (ICD-441.2) 18)  Gastroesophageal Reflux Disease, Chronic  (ICD-530.81) 19)  Allergic Rhinitis, Chronic  (ICD-477.9) 20)  Osteoporosis  (ICD-733.00)  Medications Prior to Update: 1)  Calcium/vitamin D .... Take 1 Tablet By Mouth Once A Day 2)  Nexium 40 Mg  Cpdr (Esomeprazole Magnesium) .... Take 1 Tablet By Mouth Once A Day As Needed 3)  Multivitamin .... Take 1 Tablet By Mouth Once A Day 4)  Gnp Flax Seed Oil 1000 Mg Caps (Flaxseed (Linseed)) .Marland Kitchen.. 1 Once Daily 5)  Valtrex 500 Mg  Tabs (Valacyclovir Hcl) .... One By Mouth Daily As Needed 6)  Vitamin D  1000 Unit  Caps (Cholecalciferol) .... Once Daily 7)  Ipratropium Bromide 0.03 % Soln (Ipratropium Bromide) .... Two Spray in Nostril Two Times A Day  Prn 8)  Zyrtec Allergy 10 Mg Tbdp (Cetirizine Hcl) .... One By Mouth Daily 9)  Sertraline Hcl 25 Mg Tabs (Sertraline Hcl) .... One By Mouth Daily  Current Medications (verified): 1)  Calcium/vitamin D .... Take 1 Tablet By Mouth Once A Day 2)  Nexium 40 Mg  Cpdr (Esomeprazole Magnesium) .... Take 1 Tablet By Mouth Once A Day As Needed 3)  Multivitamin .... Take 1 Tablet By Mouth Once A Day 4)  Gnp Flax Seed Oil 1000 Mg  Caps (Flaxseed (Linseed)) .Marland Kitchen.. 1 Once Daily 5)  Valtrex 500 Mg  Tabs (Valacyclovir Hcl) .... One By Mouth Daily As Needed 6)  Vitamin D 1000 Unit  Caps (Cholecalciferol) .... Once Daily 7)  Ipratropium Bromide 0.03 % Soln (Ipratropium Bromide) .... Two Spray in Nostril Two Times A Day  Prn 8)  Zyrtec Allergy 10 Mg Tbdp (Cetirizine Hcl) .... One By Mouth Daily 9)  Sertraline Hcl 25 Mg Tabs (Sertraline Hcl) .... One By Mouth Daily  Allergies (verified): No Known Drug Allergies  Past History:  Family History: Last updated: 06/05/2009 Father age related issues Mother alive at 44 [facility] sister had connective tissue dz  Social History: Last updated: 03/18/2007 Married Never Smoked Retired  Risk Factors: Smoking Status: never (01/18/2010)  Past medical, surgical, family and social histories (including risk factors) reviewed, and no changes noted (except as noted below).  Past Medical History: Reviewed history from 06/06/2008 and no changes required. Osteoporosis Ascending Aortic Aneurysm hx of Ovarian CA raynauds GERD insomnia  Past Surgical History: Reviewed history from 05/04/2007 and no changes required. Ovarian CA S/P TAH/BSO-1979 Appendectomy Hysterectomy-1980 Bilateral Salpingo-OOphorectomy-1980 Colonoscopy  Family History: Reviewed history from 06/05/2009 and no changes required. Father age related issues Mother alive at 72 [facility] sister had connective tissue dz  Social History: Reviewed history from 03/18/2007 and no changes required. Married Never Smoked Retired  Review of Systems  The patient denies anorexia, fever, weight loss, weight gain, vision loss, decreased hearing, hoarseness, chest pain, syncope, dyspnea on exertion, peripheral edema, prolonged cough, headaches, hemoptysis, abdominal pain, melena, hematochezia, severe indigestion/heartburn, hematuria, incontinence, genital sores, muscle weakness, suspicious skin lesions,  transient blindness, difficulty walking, depression, unusual weight change, abnormal bleeding, enlarged lymph nodes, angioedema, and breast masses.         insomnia  Physical Exam  General:  Well-developed,well-nourished,in no acute distress; alert,appropriate and cooperative throughout examination  Head:  Normocephalic and atraumatic without obvious abnormalities. No apparent alopecia or balding. Eyes:  pupils equal and pupils round.   Ears:  R ear normal and L ear normal.   Nose:  nasal dischargemucosal pallor and mucosal edema.   Mouth:  posterior lymphoid hypertrophy and postnasal drip.   Neck:  No deformities, masses, or tenderness noted. Lungs:  normal respiratory effort and no dullness.   Heart:  normal rate and regular rhythm.   Abdomen:  Bowel sounds positive,abdomen soft and non-tender without masses, organomegaly or hernias noted. Msk:  normal ROM, no redness over joints, no joint deformities, and no joint instability.   Extremities:  No clubbing, cyanosis, edema, or deformity noted with normal full range of motion of all joints.   Neurologic:  No cranial nerve deficits noted. Station and gait are normal. Plantar reflexes are down-going bilaterally. DTRs are symmetrical throughout. Sensory, motor and coordinative functions appear intact.  Impression & Recommendations:  Problem # 1:  GENERALIZED ANXIETY DISORDER (ICD-300.02)  insomin is a promenant feature silenor Her updated medication list for this problem includes:    Sertraline Hcl 25 Mg Tabs (Sertraline hcl) ..... One by mouth daily  Discussed medication use and relaxation techniques.   Problem # 2:  ALLERGIC RHINITIS, CHRONIC (ICD-477.9)  eosinophils Her updated medication list for this problem includes:    Ipratropium Bromide 0.03 % Soln (Ipratropium bromide) .Marland Kitchen..Marland Kitchen Two spray in nostril two times a day  prn    Zyrtec Allergy 10 Mg Tbdp (Cetirizine hcl) ..... One by mouth daily  Discussed use of allergy  medications and environmental measures.   Problem # 3:  NECK AND BACK PAIN (ICD-723.1) referred  for massage and PT for pai Discussed exercises and use of moist heat or cold and medication.   Problem # 4:  GERD (ICD-530.81)  increased  gas and wants to go to a nutritionist Her updated medication list for this problem includes:    Nexium 40 Mg Cpdr (Esomeprazole magnesium) .Marland Kitchen... Take 1 tablet by mouth once a day as needed  Labs Reviewed: Hgb: 13.1 (12/13/2009)   Hct: 38.8 (12/13/2009)  Orders: Nutrition Referral (Nutrition)  Complete Medication List: 1)  Calcium/vitamin D  .... Take 1 tablet by mouth once a day 2)  Nexium 40 Mg Cpdr (Esomeprazole magnesium) .... Take 1 tablet by mouth once a day as needed 3)  Multivitamin  .... Take 1 tablet by mouth once a day 4)  Gnp Flax Seed Oil 1000 Mg Caps (Flaxseed (linseed)) .Marland Kitchen.. 1 once daily 5)  Valtrex 500 Mg Tabs (Valacyclovir hcl) .... One by mouth daily as needed 6)  Vitamin D 1000 Unit Caps (Cholecalciferol) .... Once daily 7)  Ipratropium Bromide 0.03 % Soln (Ipratropium bromide) .... Two spray in nostril two times a day  prn 8)  Zyrtec Allergy 10 Mg Tbdp (Cetirizine hcl) .... One by mouth daily 9)  Sertraline Hcl 25 Mg Tabs (Sertraline hcl) .... One by mouth daily 10)  Silenor 6 Mg Tabs (Doxepin hcl) .... One by mouth q hs  Dyspepsia Assessment/Plan:  Step Therapy: GERD Treatment Protocols:    Step-1: started  Patient Instructions: 1)  Please schedule a follow-up appointment in 3 months.

## 2010-10-22 NOTE — Progress Notes (Signed)
Summary: not better  Phone Note Call from Patient Call back at Home Phone [phone]   Caller: vm Summary of Call: Not better.  Left urine specimen there Fri.  Results.  What to do?  Initial call taken by: Rudy Jew, RN,  July 01, 2010 10:13 AM

## 2010-10-22 NOTE — Letter (Signed)
Summary: [facility]  [facility]   Imported By: Maryln Gottron 02/06/2010 09:48:50  _____________________________________________________________________  External Attachment:    Type:   Image     Comment:   External Document

## 2010-10-22 NOTE — Progress Notes (Signed)
Summary: ct scan  Phone Note Call from Patient Call back at Home Phone [phone]   Caller: Patient Call For: Stacie Glaze MD Summary of Call: pt would like ct scan results Initial call taken by: Heron Sabins,  July 04, 2010 4:31 PM  Follow-up for Phone Call        per dr Lavella Lemons and old compression fx causing pain- take fiber two times a day =pt informed Follow-up by: Willy Eddy, LPN,  July 05, 2010 9:49 AM

## 2010-10-22 NOTE — Progress Notes (Signed)
  Phone Note Call from Patient   Reason for Call: Acute Illness Summary of Call: pt called c/o of abd pain for 1-2 months---tp gyn yesterday withno dx.  pt offered ov with dr Abner Greenspan an d pt states no - she wants to talk with dr Lovell Sheehan personally- when she was told no opening until november and to please consider seeing another md here,became upset and demands to speak with dr Lovell Sheehan? pt informed message would be taken and given to dr Lovell Sheehan and he would be informed..very demanding and rude! Initial call taken by: Willy Eddy, LPN,  June 27, 2010 8:49 AM  Follow-up for Phone Call        cancellation for tomorrow- give to pt Follow-up by: Willy Eddy, LPN,  June 27, 2010 10:10 AM

## 2010-10-22 NOTE — Assessment & Plan Note (Signed)
Summary: 6 mo rov/mm----PT [facility] // RS   Vital Signs:  Patient profile:   77 year old female Height:      66 inches Weight:      146 pounds BMI:     23.65 Temp:     98.2 degrees F oral Pulse rate:   72 / minute Resp:     14 per minute BP sitting:   130 / 78  (left arm)  Vitals Entered By: Willy Eddy, LPN (July 24, 2010 9:04 AM) CC: roa- started taking magnesium and states it helps abd pain, Abdominal pain Is Patient Diabetic? No   Primary Care Provider:  Stacie Glaze MD  CC:  roa- started taking magnesium and states it helps abd pain and Abdominal pain.  History of Present Illness: discussion of colon and discussion of back pain and compression fracture of T12 consider  PT and or injection for pain control ( see ct of abdomin)  Abdominal Pain      This is a 77 year old woman who presents with Abdominal pain.  The patient denies nausea, vomiting, diarrhea, constipation, melena, hematochezia, anorexia, and hematemesis.  The location of the pain is left lower quadrant.  The pain is described as intermittent and dull.  The patient denies the following symptoms: fever, weight loss, dysuria, chest pain, jaundice, dark urine, missed menstrual period, and vaginal bleeding.  The pain is better with antispasmodics.    Preventive Screening-Counseling & Management  Alcohol-Tobacco     Smoking Status: never     Tobacco Counseling: not indicated; no tobacco use  Problems Prior to Update: 1)  Compression Fracture, Thoracic Vertebra  (ICD-805.2) 2)  Back Pain, Lumbar  (ICD-724.2) 3)  Abdominal Pain, Lower  (ICD-789.09) 4)  Neck and Back Pain  (ICD-723.1) 5)  Fatigue, Chronic  (ICD-780.79) 6)  Osteoarthrosis Unspec Whether Gen/loc Lower Leg  (ICD-715.96) 7)  Osteoarthros Unspec Whether Gen/loc [employer] [place]  (ICD-715.91) 8)  Uri  (ICD-465.9) 9)  Generalized Anxiety Disorder  (ICD-300.02) 10)  Abdominal or Pelvic Swelling Mass or Lump Llq  (ICD-789.34) 11)  Irritable Bowel  Syndrome  (ICD-564.1) 12)  Herpes Simplex Infection  (ICD-054.9) 13)  Preventive Health Care  (ICD-V70.0) 14)  Gerd  (ICD-530.81) 15)  Dysuria  (ICD-788.1) 16)  Lung Nodule  (ICD-518.89) 17)  Chest Pain  (ICD-786.50) 18)  Dyspnea  (ICD-786.05) 19)  Raynaud's Syndrome  (ICD-443.0) 20)  Degenerative Disc Disease, Cervical Spine  (ICD-722.4) 21)  Aneurysm, Thoracic Aortic  (ICD-441.2) 22)  Gastroesophageal Reflux Disease, Chronic  (ICD-530.81) 23)  Allergic Rhinitis, Chronic  (ICD-477.9) 24)  Osteoporosis  (ICD-733.00)  Current Problems (verified): 1)  Back Pain, Lumbar  (ICD-724.2) 2)  Abdominal Pain, Lower  (ICD-789.09) 3)  Neck and Back Pain  (ICD-723.1) 4)  Fatigue, Chronic  (ICD-780.79) 5)  Osteoarthrosis Unspec Whether Gen/loc Lower Leg  (ICD-715.96) 6)  Osteoarthros Unspec Whether Gen/loc [employer] [place]  (ICD-715.91) 7)  Uri  (ICD-465.9) 8)  Generalized Anxiety Disorder  (ICD-300.02) 9)  Abdominal or Pelvic Swelling Mass or Lump Llq  (ICD-789.34) 10)  Irritable Bowel Syndrome  (ICD-564.1) 11)  Herpes Simplex Infection  (ICD-054.9) 12)  Preventive Health Care  (ICD-V70.0) 13)  Gerd  (ICD-530.81) 14)  Dysuria  (ICD-788.1) 15)  Lung Nodule  (ICD-518.89) 16)  Chest Pain  (ICD-786.50) 17)  Dyspnea  (ICD-786.05) 18)  Raynaud's Syndrome  (ICD-443.0) 19)  Degenerative Disc Disease, Cervical Spine  (ICD-722.4) 20)  Aneurysm, Thoracic Aortic  (ICD-441.2) 21)  Gastroesophageal Reflux Disease,  Chronic  (ICD-530.81) 22)  Allergic Rhinitis, Chronic  (ICD-477.9) 23)  Osteoporosis  (ICD-733.00)  Medications Prior to Update: 1)  Calcium/vitamin D .... Take 1 Tablet By Mouth Once A Day 2)  Nexium 40 Mg  Cpdr (Esomeprazole Magnesium) .... Take 1 Tablet By Mouth Once A Day As Needed 3)  Multivitamin .... Take 1 Tablet By Mouth Once A Day 4)  Gnp Flax Seed Oil 1000 Mg Caps (Flaxseed (Linseed)) .Marland Kitchen.. 1 Once Daily 5)  Valtrex 500 Mg  Tabs (Valacyclovir Hcl) .... One By Mouth Daily As  Needed 6)  Vitamin D 1000 Unit  Caps (Cholecalciferol) .... Once Daily 7)  Zyrtec Allergy 10 Mg Tbdp (Cetirizine Hcl) .... One By Mouth Daily 8)  Sertraline Hcl 25 Mg Tabs (Sertraline Hcl) .... One By Mouth Daily 9)  Ciprofloxacin Hcl 500 Mg Tabs (Ciprofloxacin Hcl) .... One  By Mouth Two Times A Day 10)  Metronidazole 500 Mg Tabs (Metronidazole) .... One By Mouth Two Times A Day For 7 Days  Current Medications (verified): 1)  Calcium/vitamin D .... Take 1 Tablet By Mouth Once A Day 2)  Nexium 40 Mg  Cpdr (Esomeprazole Magnesium) .... Take 1 Tablet By Mouth Once A Day As Needed 3)  Multivitamin .... Take 1 Tablet By Mouth Once A Day 4)  Gnp Flax Seed Oil 1000 Mg Caps (Flaxseed (Linseed)) .Marland Kitchen.. 1 Once Daily 5)  Valtrex 500 Mg  Tabs (Valacyclovir Hcl) .... One By Mouth Daily As Needed 6)  Vitamin D 1000 Unit  Caps (Cholecalciferol) .... Once Daily 7)  Zyrtec Allergy 10 Mg Tbdp (Cetirizine Hcl) .... One By Mouth Daily 8)  Sertraline Hcl 25 Mg Tabs (Sertraline Hcl) .... One By Mouth Daily 9)  Magnesium 300 Mg Caps (Magnesium) .Marland Kitchen.. 1 Once Daily  Allergies (verified): No Known Drug Allergies  Past History:  Past Medical History: Last updated: 06/06/2008 Osteoporosis Ascending Aortic Aneurysm hx of Ovarian CA raynauds GERD insomnia  Past Surgical History: Last updated: 05/04/2007 Ovarian CA S/P TAH/BSO-1979 Appendectomy Hysterectomy-1980 Bilateral Salpingo-OOphorectomy-1980 Colonoscopy  Family History: Last updated: 06/05/2009 Father age related issues Mother alive at 79 [facility] sister had connective tissue dz  Social History: Last updated: 03/18/2007 Married Never Smoked Retired  Risk Factors: Smoking Status: never (07/24/2010)  Review of Systems  The patient denies anorexia, fever, weight loss, weight gain, vision loss, decreased hearing, hoarseness, chest pain, syncope, dyspnea on exertion, peripheral edema, prolonged cough, headaches, hemoptysis,  abdominal pain, melena, hematochezia, severe indigestion/heartburn, hematuria, incontinence, genital sores, muscle weakness, suspicious skin lesions, transient blindness, difficulty walking, depression, unusual weight change, abnormal bleeding, enlarged lymph nodes, angioedema, and breast masses.    Physical Exam  General:  Well-developed,well-nourished,in no acute distress; alert,appropriate and cooperative throughout examination  Head:  Normocephalic and atraumatic without obvious abnormalities. No apparent alopecia or balding. Eyes:  pupils equal and pupils round.   Ears:  R ear normal and L ear normal.   Nose:  nasal dischargemucosal pallor and mucosal edema.   Mouth:  posterior lymphoid hypertrophy and postnasal drip.   Neck:  No deformities, masses, or tenderness noted. Lungs:  normal respiratory effort and no dullness.   Heart:  normal rate and regular rhythm.   Abdomen:  no rigidity, no rebound tenderness, no abdominal hernia, no hepatomegaly, no splenomegaly, and LUQ tenderness.     Impression & Recommendations:  Problem # 1:  BACK PAIN, LUMBAR (ICD-724.2)  Discussed use of moist heat or ice, modified activities, medications, and stretching/strengthening exercises. Back care instructions given. To  be seen in 2 weeks if no improvement; sooner if worsening of symptoms.   Orders: Physical Therapy Referral (PT)  Problem # 2:  COMPRESSION FRACTURE, THORACIC VERTEBRA (ICD-805.2)  referral for therapy  Orders: Venipuncture (16109) Specimen Handling (60454) Physical Therapy Referral (PT) TLB-CBC Platelet - w/Differential (85025-CBCD) TLB-Calcium (82310-CA)  Problem # 3:  ABDOMINAL PAIN, LOWER (ICD-789.09)  Discussed use of medications, application of heat or cold, and exercises.   Complete Medication List: 1)  Calcium/vitamin D  .... Take 1 tablet by mouth once a day 2)  Nexium 40 Mg Cpdr (Esomeprazole magnesium) .... Take 1 tablet by mouth once a day as needed 3)   Multivitamin  .... Take 1 tablet by mouth once a day 4)  Gnp Flax Seed Oil 1000 Mg Caps (Flaxseed (linseed)) .Marland Kitchen.. 1 once daily 5)  Valtrex 500 Mg Tabs (Valacyclovir hcl) .... One by mouth daily as needed 6)  Vitamin D 1000 Unit Caps (Cholecalciferol) .... Once daily 7)  Zyrtec Allergy 10 Mg Tbdp (Cetirizine hcl) .... One by mouth daily 8)  Sertraline Hcl 25 Mg Tabs (Sertraline hcl) .... One by mouth daily 9)  Magnesium 300 Mg Caps (Magnesium) .Marland Kitchen.. 1 once daily  Other Orders: T-Vitamin D (25-Hydroxy) [phone])  Patient Instructions: 1)  MArch for medicare wellness exam 30 min   Orders Added: 1)  Est. Patient Level IV [29562] 2)  Venipuncture [13086] 3)  T-Vitamin D (25-Hydroxy) [57846-96295] 4)  Specimen Handling [99000] 5)  Physical Therapy Referral [PT] 6)  TLB-CBC Platelet - w/Differential [85025-CBCD] 7)  TLB-Calcium [82310-CA]

## 2010-10-22 NOTE — Miscellaneous (Signed)
Summary: Initial Summary for PT Services/[facility]  Initial Summary for PT Services/[facility]   Imported By: Maryln Gottron 08/05/2010 15:05:14  _____________________________________________________________________  External Attachment:    Type:   Image     Comment:   External Document

## 2010-10-22 NOTE — Progress Notes (Signed)
Summary: REQ FOR LAB (UA) ?  Phone Note Call from Patient   Caller: Patient [phone] Reason for Call: Talk to Nurse, Talk to Doctor Summary of Call: Pt called to adv that she thinks she may have a UTI because she has been sxs of pressure on bladder / back pain..... Pt wants to know if she can just come in and have a UA performed at the lab instead of making an appt..... Can you advise?  Initial call taken by: Debbra Riding,  November 22, 2009 4:45 PM  Follow-up for Phone Call        can come in for UA but must wait for reading if negatqive may go if positive will quickly see and treat Follow-up by: Stacie Glaze MD,  November 23, 2009 9:25 AM  Additional Follow-up for Phone Call Additional follow up Details #1::        Pt notified and will come over. Additional Follow-up by: Lynann Beaver CMA,  November 23, 2009 9:34 AM

## 2010-10-24 NOTE — Miscellaneous (Signed)
Summary: Discharge Summary for PT / [facility]  Discharge Summary for PT / [facility]   Imported By: Lennie Odor 09/11/2010 11:31:41  _____________________________________________________________________  External Attachment:    Type:   Image     Comment:   External Document

## 2010-11-14 ENCOUNTER — Other Ambulatory Visit: Payer: Self-pay | Admitting: Surgery

## 2010-11-14 DIAGNOSIS — I719 Aortic aneurysm of unspecified site, without rupture: Secondary | ICD-10-CM

## 2010-11-25 ENCOUNTER — Ambulatory Visit
Admission: RE | Admit: 2010-11-25 | Discharge: 2010-11-25 | Disposition: A | Discharge status: Home / Self Care | Payer: PRIVATE HEALTH INSURANCE | Source: Ambulatory Visit | Attending: Obstetrics and Gynecology | Admitting: Obstetrics and Gynecology

## 2010-11-25 DIAGNOSIS — Z1239 Encounter for other screening for malignant neoplasm of breast: Secondary | ICD-10-CM

## 2010-12-10 ENCOUNTER — Ambulatory Visit
Admission: RE | Admit: 2010-12-10 | Discharge: 2010-12-10 | Disposition: A | Discharge status: Home / Self Care | Payer: Medicare Other | Source: Ambulatory Visit | Attending: Surgery | Admitting: Surgery

## 2010-12-10 ENCOUNTER — Other Ambulatory Visit: Payer: Self-pay | Admitting: Surgery

## 2010-12-10 ENCOUNTER — Encounter (INDEPENDENT_AMBULATORY_CARE_PROVIDER_SITE_OTHER): Payer: Medicare Other | Admitting: Surgery

## 2010-12-10 DIAGNOSIS — I712 Thoracic aortic aneurysm, without rupture, unspecified: Secondary | ICD-10-CM

## 2010-12-10 DIAGNOSIS — G543 Thoracic root disorders, not elsewhere classified: Secondary | ICD-10-CM

## 2010-12-10 DIAGNOSIS — I719 Aortic aneurysm of unspecified site, without rupture: Secondary | ICD-10-CM

## 2010-12-10 MED ORDER — GADOBENATE DIMEGLUMINE 529 MG/ML IV SOLN
15.0000 mL | Freq: Once | INTRAVENOUS | Status: AC | PRN
  Administered 2010-12-10: 15 mL via INTRAVENOUS

## 2010-12-10 NOTE — Assessment & Plan Note (Signed)
OFFICE VISIT  Cindy Schwartz, Cindy Schwartz DOB:  1934/04/28                                        December 10, 2010 CHART #:  [id number]  The patient returned to my office today for followup of an ascending aortic aneurysm.  I last saw her on December 05, 2008, at which time, MR angiogram of the chest showed the size of the ascending aortic aneurysm to be between 4.1-4.3 cm in maximum diameter.  This was unchanged from her previous CT scan 1 year prior to that.  Her aneurysm did not change in size as far back as CT scan of 2005.  Since I last saw her, she said she has continued to feel well.  She denies any chest pain or pressure. She has had no shortness of breath.  On physical examination, her blood pressure 109/66, pulse 84 and regular, respiratory rate is 16 and unlabored.  Oxygen saturation on room air is 98%.  She looks well.  Cardiac exam shows regular rate and rhythm with normal S1 and S2.  There is no murmur, rub, or gallop.  Her lungs are clear.  MR angiogram of the chest done today shows a stable fusiform ascending aortic aneurysm with a maximum diameter of 4.2 cm at the level of the right pulmonary artery.  This goes back to normal size in the proximal aortic arch and the descending aorta appears to be of normal size.  IMPRESSION:  The patient has a stable fusiform ascending aortic aneurysm measuring 4.2 cm which is essentially unchanged since 2005.  She has good blood pressure control, is otherwise in good medical condition.  I have recommended followup MR angiogram to be done in 2 years and I will see her back at that time for reevaluation.  Evelene Croon, M.D. Electronically Signed  BB/MEDQ  D:  12/10/2010  T:  12/10/2010  Job:  [id number]  cc:   Stacie Glaze, MD

## 2010-12-11 NOTE — Assessment & Plan Note (Signed)
OFFICE VISIT  TALAYAH, PICARDI DOB:  1934-07-09                                        December 10, 2010 CHART #:  [id number]  The patient was seen earlier in my office today for followup of the ascending aortic aneurysm.  This was unchanged on MRI scan today.  Dr. Richarda Overlie of Radiology called me back to discuss some abnormal findings seen on the MR angiogram once it was read by Radiology.  Unfortunately, it was not read until the patient has already gone home.  He noted that there was a suspicious lesion in the anterior aspect of the T9 vertebral body measuring 1.6 cm and another in the left pedicle of T8 that were suspicious for possible metastatic cancer.  There was also some chronic deformity along the superior endplate of T12 that was felt to be a possible compression fracture.  He felt that the patient should have a MRI with and without contrast of the thoracic spine to further evaluate these areas.  I called the patient to notify her of these abnormalities. She does have a history of ovarian cancer that was over 30 years ago. She has had no evidence of recurrence from that.  She said she had a recent mammogram which was negative.  I told her that we would have my office schedule the MRI and would notify her tomorrow morning of the time.  I will then see her back in the office after that to discuss the results with her and decide of any further studies or intervention as needed.  She understands and agrees with that plan.  Evelene Croon, M.D. Electronically Signed  BB/MEDQ  D:  12/10/2010  T:  12/11/2010  Job:  [id number]  cc:   Stacie Glaze, MD

## 2010-12-13 ENCOUNTER — Ambulatory Visit
Admission: RE | Admit: 2010-12-13 | Discharge: 2010-12-13 | Disposition: A | Discharge status: Home / Self Care | Payer: Medicare Other | Source: Ambulatory Visit | Attending: Surgery | Admitting: Surgery

## 2010-12-13 DIAGNOSIS — G543 Thoracic root disorders, not elsewhere classified: Secondary | ICD-10-CM

## 2010-12-13 MED ORDER — GADOBENATE DIMEGLUMINE 529 MG/ML IV SOLN
13.0000 mL | Freq: Once | INTRAVENOUS | Status: AC | PRN
  Administered 2010-12-13: 13 mL via INTRAVENOUS

## 2010-12-18 ENCOUNTER — Encounter: Payer: Self-pay | Admitting: Internal Medicine

## 2010-12-20 ENCOUNTER — Ambulatory Visit (INDEPENDENT_AMBULATORY_CARE_PROVIDER_SITE_OTHER): Payer: Medicare Other | Admitting: Internal Medicine

## 2010-12-20 ENCOUNTER — Other Ambulatory Visit: Payer: Self-pay | Admitting: *Deleted

## 2010-12-20 ENCOUNTER — Encounter: Payer: Self-pay | Admitting: Internal Medicine

## 2010-12-20 VITALS — BP 110/70 | HR 72 | Temp 98.2°F | Resp 14 | Ht 69.0 in | Wt 144.0 lb

## 2010-12-20 DIAGNOSIS — F411 Generalized anxiety disorder: Secondary | ICD-10-CM

## 2010-12-20 DIAGNOSIS — M810 Age-related osteoporosis without current pathological fracture: Secondary | ICD-10-CM

## 2010-12-20 DIAGNOSIS — K219 Gastro-esophageal reflux disease without esophagitis: Secondary | ICD-10-CM

## 2010-12-20 DIAGNOSIS — J069 Acute upper respiratory infection, unspecified: Secondary | ICD-10-CM

## 2010-12-20 DIAGNOSIS — K589 Irritable bowel syndrome without diarrhea: Secondary | ICD-10-CM

## 2010-12-20 DIAGNOSIS — Z0000 Encounter for general adult medical examination without abnormal findings: Secondary | ICD-10-CM

## 2010-12-20 DIAGNOSIS — R5381 Other malaise: Secondary | ICD-10-CM

## 2010-12-20 DIAGNOSIS — R5383 Other fatigue: Secondary | ICD-10-CM

## 2010-12-20 DIAGNOSIS — E785 Hyperlipidemia, unspecified: Secondary | ICD-10-CM

## 2010-12-20 LAB — BASIC METABOLIC PANEL
BUN: 14 mg/dL (ref 6–23)
CO2: 30 mEq/L (ref 19–32)
Chloride: 104 mEq/L (ref 96–112)
Creatinine, Ser: 0.8 mg/dL (ref 0.4–1.2)
Glucose, Bld: 75 mg/dL (ref 70–99)

## 2010-12-20 LAB — CBC WITH DIFFERENTIAL/PLATELET
Basophils Relative: 0.7 % (ref 0.0–3.0)
Eosinophils Absolute: 0.3 10*3/uL (ref 0.0–0.7)
Hemoglobin: 13.6 g/dL (ref 12.0–15.0)
Lymphocytes Relative: 28.9 % (ref 12.0–46.0)
MCHC: 34 g/dL (ref 30.0–36.0)
Monocytes Relative: 9.6 % (ref 3.0–12.0)
Neutro Abs: 3.5 10*3/uL (ref 1.4–7.7)
RBC: 4.59 Mil/uL (ref 3.87–5.11)

## 2010-12-20 LAB — LIPID PANEL: Total CHOL/HDL Ratio: 3

## 2010-12-20 LAB — LDL CHOLESTEROL, DIRECT: Direct LDL: 129.5 mg/dL

## 2010-12-20 MED ORDER — VALACYCLOVIR HCL 500 MG PO TABS: 500.0000 mg | ORAL_TABLET | Freq: Every day | ORAL | Status: DC | PRN

## 2010-12-20 NOTE — Patient Instructions (Signed)
You should ask your pharmacist for B12 500 MCG.dots and use one sublingual every day

## 2010-12-20 NOTE — Progress Notes (Signed)
Subjective:    Patient ID: Cindy Schwartz, female    DOB: Dec 18, 1933, 77 y.o.   MRN: [id number]  HPI patient presents for a Medicare wellness examination as well as followup of her chronic medical problems including generalized anxiety disorder which seems to be worsening recently history of Osteoporosis compression fracture of the spine chronic esophageal reflux with irritable bowel syndrome  And much of her problem list is related to anxiety worsening these chronic conditions.  She has been seeing a specialist for her back.   Review of Systems  Constitutional: Negative for activity change, appetite change and fatigue.  HENT: Negative for ear pain, congestion, neck pain, postnasal drip and sinus pressure.   Eyes: Negative for redness and visual disturbance.  Respiratory: Negative for cough, shortness of breath and wheezing.   Gastrointestinal: Negative for abdominal pain and abdominal distention.  Genitourinary: Negative for dysuria, frequency and menstrual problem.  Musculoskeletal: Negative for myalgias, joint swelling and arthralgias.  Skin: Negative for rash and wound.  Neurological: Negative for dizziness, weakness and headaches.  Hematological: Negative for adenopathy. Does not bruise/bleed easily.  Psychiatric/Behavioral: Negative for sleep disturbance and decreased concentration.       Objective:   Physical Exam        Assessment & Plan:   Subjective:    Cindy Schwartz is a 77 y.o. female who presents for a welcome to Medicare exam.   Cardiac risk factors: advanced age (older than 29 for men, 44 for women).  Activities of Daily Living  In your present state of health, do you have any difficulty performing the following activities?:  Preparing food and eating?: No Bathing yourself: No Getting dressed: No Using the toilet:No Moving around from place to place: No In the past year have you fallen or had a near fall?:No  Current exercise habits: The patient does not  participate in regular exercise at present.   Dietary issues discussed:  She does not following a diet Cognitive screen: The patient is able to read a clock face , mathematical calculations, balance her checkbook, recall 3 objects at , perform activities of daily living with normal cognitive function  Depression Screen (Note: if answer to either of the following is "Yes", then a more complete depression screening is indicated)  Q1: Over the past two weeks, have you felt down, depressed or hopeless?yes Q2: Over the past two weeks, have you felt little interest or pleasure in doing things? no   The following portions of the patient's history were reviewed and updated as appropriate: allergies, current medications, past family history, past medical history, past social history, past surgical history and problem list. Review of Systems Constitutional: positive for fatigue Eyes: negative Ears, nose, mouth, throat, and face: positive for sore mouth and sore throat Respiratory: positive for cough Cardiovascular: negative    Objective:     Vision by Snellen chart: right [id number] with glasses to 20/20 both eye, left [id number] Blood pressure 110/70, pulse 72, temperature 98.2 F (36.8 C), temperature source Oral, resp. rate 14, height 5\' 9"  (1.753 m), weight 144 lb (65.318 kg). Body mass index is 21.27 kg/(m^2). General appearance: alert, cooperative and appears stated age Head: Normocephalic, without obvious abnormality, atraumatic, sinuses nontender to percussion Eyes: conjunctivae/corneas clear. PERRL, EOM's intact. Fundi benign. Ears: normal TM's and external ear canals both ears Throat: lips, mucosa, and tongue normal; teeth and gums normal and There is moderate erythema to the tongue with some papular atrophy Neck: no adenopathy, no carotid bruit, no JVD, supple,  symmetrical, trachea midline and thyroid not enlarged, symmetric, no tenderness/mass/nodules Back: symmetric, no  curvature. ROM normal. No CVA tenderness. Lungs: clear to auscultation bilaterally Heart: S1, S2 normal and systolic murmur: systolic ejection 1/6, crescendo at 2nd left intercostal space Abdomen: soft, non-tender; bowel sounds normal; no masses,  no organomegaly Extremities: extremities normal, atraumatic, no cyanosis or edema Pulses: 2+ and symmetric Skin: Skin color, texture, turgor normal. No rashes or lesions    Assessment:    This is a routine physical examination for this healthy  Female. Reviewed all health maintenance protocols including mammography colonoscopy bone density and reviewed appropriate screening labs. Her immunization history was reviewed as well as her current medications and allergies refills of her chronic medications were given and the plan for yearly health maintenance was discussed all orders and referrals were made as appropriate.    will monitor a B12 for the paresthesias in her mouth this may be B12 deficiency we recommended that she is B12 sublingual dots daily he'll monitor this.  4 osteoporosis Will monitor her vitamin D level and calcium her creatinine and BUN were monitored her renal function a lipid panel will be obtained for screening purposes for hyperlipidemia and cardiovascular risks she is reinforced her diet and discussion about referral to a diet patient for counseling she will continue the Nexium we also reinforced forced exercise.    Plan:     During the course of the visit the patient was educated and counseled about appropriate screening and preventive services including:   Screening mammography  Glaucoma screening  Nutrition counseling   Patient Instructions (the written plan) was given to the patient.

## 2011-02-04 NOTE — Assessment & Plan Note (Signed)
[facility]                             PULMONARY OFFICE NOTE   NAME:[name], Cindy Schwartz                          MRN:          [id number]  DATE:07/27/2007                            DOB:          December 20, 1933    PROBLEM:  1. Rhinitis, postnasal drip.  2. Mild exertional dyspnea.  3. Mild hyperinflation, question chronic obstructive pulmonary      disease.   HISTORY:  Veramyst did not work.  Has had flu shot.  Has not seen change  with weather as the Fall moves in.  Annoying postnasal drip sensation.  We looked back, finding that she never got full pulmonary function  scheduled, as intended, and just had a methacholine challenge in  February of 2008.  This had shown mild obstructive disease in small  airways without hyperreactive airways.  She complains of a sensation of  a bubble behind her palate.  She works at her throat, trying to snort  to bring up a little scant sputum occasionally.  CT of sinuses was  negative in the past.  She had seen Dr. Haroldine Laws quite awhile ago,  stating antibiotic initially helped but that these complaints developed  again about a year ago.   MEDICATION:  Nexium, Calcium with Vitamin D, multivitamins and Fish Oil.   No medication allergy.   OBJECTIVE:  Weigh 151 pounds, BP 102/70, pulse 70, room air saturation  99%.  EAR, NOSE AND THROAT:  Clear.  Voice quality is normal with no stridor.  She is not coughing or throat clearing while here.  CHEST:  Sounds clear.  I find no adenopathy.   IMPRESSION:  1. Postnasal drip sensation, nonspecific.  2. Dyspnea with asthmatic bronchitis.   PLAN:  1. Try Astelin Nasal Spray once each nostril up to b.i.d. p.r.n.  2. Nasal saline lavage with information given.  3. Omnicef 300 mg x2 for seven days.  4. We discussed return to ENT evaluation for rhinoscopy.  5. Schedule return in 4 months, earlier p.r.n.     Clinton D. Maple Hudson, MD, Tonny Bollman, FACP  Electronically Signed   CDY/MedQ   DD: 07/31/2007  DT: 08/01/2007  Job #: [id number]   cc:   Stacie Glaze, MD

## 2011-02-04 NOTE — Assessment & Plan Note (Signed)
[facility]                             PULMONARY OFFICE NOTE   NAME:ROSICChristianne, Zacher                          MRN:          [id number]  DATE:05/05/2007                            DOB:          [birth year]    PROBLEM:  1. Rhinitis with postnasal drip.  2. Mild exertional dyspnea.  3. Mild hyperinflation, question chronic obstructive pulmonary      disease.   HISTORY:  We sought pulmonary function test results and got a  methacholine challenge report which was negative, but we did not get the  full PFTs.  I wanted to look at lung volumes.  We are asking again for  that result.  She feels need to cough to clear her airways and is aware  sometimes of some shortness of breath.  She feels she tightens rapidly  with odors, so I am surprised that the methacholine test was normal.   LABORATORY DATA:  Lab work included a normal IGE at 13.7.  She had an  eosinophil count of 5.2 which would be minimally elevated and of  doubtful significance.  She comes today for allergy testing.   OBJECTIVE:  Weigh 144 pounds, blood pressure 112/80, pulse 72, room air  saturation 99%.  LUNGS:  Clear.  HEART:  Heart sounds normal.  SKIN TEST:  Positive histamine and negative diluent controls.  Positive  ventrodermal and puncture reactions, mostly at low titer, for a number  of grass, weed, and tree pollens, dust mite, and cockroach.   IMPRESSION:  I am not sure all of chest discomfort is respiratory and  some of it may be esophageal, but I cannot completely exclude a mild  asthma.  There does seem to be a mild rhinitis.  She did not smoke but  had second hand exposure.   PLAN:  We will seek the rest of her pulmonary function tests if those  were done.  Meanwhile she is going to try Singulair and she is going to  try a longer sample of Veramyst one spray each nostril daily.  Schedule  return in two months, earlier p.r.n.     Clinton D. Maple Hudson, MD, Tonny Bollman, FACP  Electronically Signed    CDY/MedQ  DD: 05/08/2007  DT: 05/09/2007  Job #: [id number]   cc:   Stacie Glaze, MD

## 2011-02-04 NOTE — Assessment & Plan Note (Signed)
[facility]                             PULMONARY OFFICE NOTE   NAME:Cindy Schwartz, Cindy Schwartz                          MRN:          [id number]  DATE:04/13/2007                            DOB:          1934-05-22    PROBLEM:  Pulmonary consultation at the kind request of Dr. Lovell Sheehan for  this 77 year old woman, complaining of persistent post-nasal drainage  and CT scan suggesting mild COPD.  She never smoked.   HISTORY:  She had some respiratory concerns for 2 years.  She had seen  Dr. Hermelinda Medicus remotely for a similar problem with postnasal  drainage and says he cured her with an antibiotic.  She went back to him  in January, when this complaint became bothersome again.  He gave her an  antibiotic, but this time it did not help.  Chest CT on January 20, 2007  was negative for pulmonary embolism noted, and an ascending aortic  aneurysm, 43 x 43 millimeters with slight progression and chronic lung  disease described as mild changes of emphysema, with no dominant mass or  effusion and some scarring in the apices, which was unchanged from a  prior study.  A limited CT of the sinuses done January 23 had described  no acute findings.  She had never smoked, but her husband had been a  smoker a long time ago.  She says any smell or irritant odor such as  perfumes makes her feel tight in the chest.  She has not had frequent  colds or bronchitis and never told she had lung disease before.  Says  she walks briskly every day and does well, but notes a little more  easily getting short of breath on stairs and hills in the last few  years.  Thick post-nasal clear mucus did not benefit from Nasonex.  Pills, apparently referring to antihistamines, did not help, but a  course of prednisone taken for arthritis did help.   MEDICATIONS:  Nexium, calcium with vitamin D, fish oil.   ALLERGIES:  NO MEDICATION ALLERGY.   REVIEW OF SYSTEMS:  Shortness of breath with exertion as  described.  Weight has been stable.  No exertional chest pain.  No peripheral edema.  She sleeps on one pillow with nocturia x1.  Arthralgias.   PAST MEDICAL HISTORY:  Ovarian cancer treated with chemotherapy and  Cytoxan for 15 months, nasal congestion with postnasal drip attributed  to allergy, aortic aneurysm, esophageal reflux, osteoporosis, dental  implant surgery.  No history of heart disease, anemia or pneumonia.  Significant for hysterectomy.  No sensitivity to latex, contrast dye or  aspirin.  No problems with foods or routine inhalant allergens that she  has recognized.   SOCIAL HISTORY:  Never smoked.  Married.  She worked in [employer].  Has lived in the [place] for 42 years, having originally  come for [place].   FAMILY HISTORY:  Cancer.  Sister died with a connective tissue disease.  Mother alive at age 79.  Nobody with lung or respiratory problems.   OBJECTIVE:  VITAL SIGNS:  Weight 147 pounds, BP 118/80, pulse 95, room  air saturation 98%.  GENERAL:  This is a well-developed, well-nourished woman, alert, no  distress.  SKIN:  No evident rash.  ADENOPATHY:  None found.  HEENT:  No neck vein distention, voice quality normal with accent, no  stridor.  No visual postnasal drainage.  CHEST:  Lung fields are clear.  Breathing is unlabored.  HEART:  Sounds regular without murmur or gallop.  EXTREMITIES:  No cyanosis, clubbing or edema.   LABORATORY:  Available lab reports indicates she had echocardiogram  unremarkable with ejection fraction 55%, limited CT scan of sinuses  negative.  CT scan of chest suggested mild COPD as noted.   IMPRESSION:  1. Rhinitis with postnasal drip.  2. Mild exertional dyspnea which is nonspecific.  3. Mild hyperinflation interpreted as COPD on CT scan, possibly from      old second hand exposure.   PLAN:  1. CBC with diff, IgE level.  2. Sample Veramyst for re-trial of steroid nasal spray, encouraging 2      sprays each  nostril, once daily until the sample is used up.  3. We will seek results of pulmonary function tests done at [facility].  4. Schedule return.  She is interested in allergy skin testing, and we      will look at that for appropriateness, when I see the rest of her      labs.  We will review symptoms again on return, recognizing her      primary concern is with the annoying postnasal drainage.     Clinton D. Maple Hudson, MD, Tonny Bollman, FACP  Electronically Signed    CDY/MedQ  DD: 04/17/2007  DT: 04/18/2007  Job #: [id number]   cc:   Stacie Glaze, MD

## 2011-02-04 NOTE — Assessment & Plan Note (Signed)
OFFICE VISIT   NOHEMY, KOOP  DOB:  1934-07-02                                        December 05, 2008  CHART #:  [id number]   The patient returns today for followup of an ascending aortic aneurysm.  I last saw her on December 21, 2007, at which time, her aneurysm appeared  stable with a diameter of 4.1 x 4.3 cm.  She had been having some  intermittent episodes of chest pain of unclear etiology, but I did not  feel that these are related to her small aneurysm.  She said that over  the past year, she has really not had any chest discomfort to speak of.   PHYSICAL EXAMINATION:  VITAL SIGNS:  Blood pressure is 120/78, pulse is  78 and regular, respiratory rate is 18 and unlabored.  Oxygen saturation  on room air is 100%.  GENERAL:  She looks well.  CARDIAC:  Regular rate and rhythm with normal S1 and S2.  There is no  murmur, rub, or gallop.  LUNGS:  Clear.  EXTREMITIES:  There is no peripheral edema.   A followup MR angiogram done on 12/04/2008 shows the size of the  ascending aortic aneurysm to be 4.1-4.3 cm in maximum diameter.  This is  unchanged compared to her last CT angiogram 1 year ago.   IMPRESSION:  The patient has a stable ascending aortic aneurysm with a  maximum diameter of 4.1-4.3 cm.  I have compared this to her CT scans as  far back in 2005.  It has not changed since 2005 when the diameter was  measured at 4.1 cm.  I have reassured her that the risk of aortic  dissection or rupture is very small at this current size and the risk of  surgical treatment is certainly much higher at this time.  I would not  recommend repairing this surgically unless she develops progressive  enlargement and reaches about 5.5 cm in diameter.  I stressed the  importance of good blood pressure control.  She will continue to follow  up with her primary  physician, Dr. Darryll Capers and I will plan to see her back in 2 years  with a MR angiogram of the thoracic  aorta.   Evelene Croon, M.D.  Electronically Signed   BB/MEDQ  D:  12/05/2008  T:  12/05/2008  Job:  [id number]   cc:   Stacie Glaze, MD

## 2011-02-04 NOTE — Assessment & Plan Note (Signed)
OFFICE VISIT   Cindy Schwartz, Cindy Schwartz  DOB:  [birth year]                                        December 21, 2007  CHART #:  [id number]   SUBJECTIVE:  Cindy Schwartz returns today for followup of her ascending  aortic aneurysm.  I have last saw her on 02/09/2007, when she presented  initially for evaluation of an ascending aortic aneurysm that was noted  on chest CT scan of 01/20/2007, performed to rule out pulmonary  embolism.  She had been having some episodes of chest pain and pressure.  That initial CT scan showed her aorta to be 4.3 x 4.3 cm at the mid  ascending level.  It was compared to a previous CT scan done in 2005  which was done when she had an episode of severe back pain and the aorta  at that time was 4.1 x 4.1 cm.  She had a negative stress Cardiolite  exam at that time in 2005.  She had also undergone an echocardiogram  which showed normal ejection fraction of 55%.  Since I last saw her, she  has been doing well overall.  She says she did have an episode in the  past week where she had sudden severe substernal chest pain that was  very sharp.  It did not last very long.  It was not associated with  exertion.  She said that she laid down and the pain went away.  She had  no further episodes.   PHYSICAL EXAMINATION:  On physical examination today, her blood pressure  is 119/71, her pulse is 72 and regular, respiratory rate is 18  unlabored, oxygen saturation on room air is 98%.  She looks well.  Cardiac exam shows a regular rate and rhythm with normal S1 and S2.  There is no murmur, rub or gallop.  Her lungs are clear.  There is no  peripheral edema.   DIAGNOSTIC DATA:  She had a followup CT angiogram of the chest on  10/07/2007, which showed stable size of the ascending aortic aneurysm  measuring 4.1 x 4.3 cm.  There was no evidence of dissection.  There is  a stable nodule at the left apex and biapical scarring.   IMPRESSION:  Cindy Schwartz has a stable  ascending aortic aneurysm measuring  4.1 x 4.3 cm in diameter.  I do not think any intervention is warranted  for this given its size.  She continues to have intermittent episodes of  chest pain, most recently 1 week ago, and no cause for this has ever  been clearly elucidated.  She is planning on follow up with her primary  physician Dr. Darryll Capers concerning this.  I doubt that her pain is  related to the aneurysm given its size.  I told her I would recommend  following up this aneurysm in 1 year, and I would use MR angiogram at  that time to decrease her long-term x-ray exposure.  If the aneurysm is  unchanged in 1 year, then I would consider waiting a couple years before  following it up.   Evelene Croon, M.D.  Electronically Signed   BB/MEDQ  D:  12/21/2007  T:  12/21/2007  Job:  [id number]   cc:   Stacie Glaze, MD

## 2011-02-07 NOTE — H&P (Signed)
Cindy Schwartz, Cindy Schwartz NO.:  [id number]   MEDICAL RECORD NO.:  [id number]          PATIENT TYPE:  EMS   LOCATION:  MAJO                         FACILITY:  [facility]   PHYSICIAN:  Titus Dubin. Alwyn Ren, M.D. [facility] OF BIRTH:  November 18, 1933   DATE OF ADMISSION:  09/05/2004  DATE OF DISCHARGE:                                HISTORY & PHYSICAL   HISTORY:  Cindy Schwartz is a 77 year old white female admitted for observation  with chest pain.  She has had intermittent chest pain for approximately  seven days with severity up to a level of 9 today.  She is unsure whether it  began in the anterior portion of the thorax and radiated posteriorly, or  vice versa.  In the emergency room, she did experience improvement with the  morphine, and CT scan did not reveal a dissecting aneurysm.  She has had  residual chest tightness after the morphine, and admission for rule out was  requested by Dr. Carren Rang, ER doctor.   She has a longstanding history of esophageal reflux for which she is on  Nexium.  She is also on Fosamax weekly for osteoporosis.  Her technique of  Fosamax ingestion was reviewed and is correct.  Specifically, she does not  lie down after she takes it.  She will drink up to two cups of coffee a day,  but denies other triggers for reflux exacerbation.  She has had no melena or  rectal bleeding.  She has had no associated nausea, vomiting or diaphoresis,  and no shortness of breath.   The pain has been nonexertional.  She states she is very physically active,  but does not engage in a regular cardiovascular exercise program.  Remotely,  she had a total abdominal hysterectomy and bilateral salpingo-oophorectomy  in 1980 for cancer of the ovaries.  Apparently, she had exploratory  laparotomy 1-1/2 to 2 years later as a second look.  She as symptomatology  with no evidence clinically of recurrence.  She had an appendectomy at age  26.  She is a gravida 2, para 2.   ALLERGIES:  She has no known drug allergies.   SOCIAL HISTORY:  She does not smoke or drink.   She is originally from [place].  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  They defected  originally to [place] for 10 years, and then immigrated to the states.   FAMILY HISTORY:  Negative for stroke, diabetes, heart attack or cancer.  A  sister had connective tissue disease.  This is actually the term that [name] uses.   She has chronic belching which she has had for years.  An endoscopy in 2001  by Dr. Melvia Heaps was negative.   She denies any other cardiopulmonary symptoms.  She has had no genitourinary  symptoms or musculoskeletal symptoms.   PHYSICAL EXAMINATION:  GENERAL:  She is a very-pleasant woman who appears  much younger than 77 years of age.  Her skin is unwrinkled and fair.  She  speaks with  a soft Eastern-European accent.  There is slight arterial  narrowing.  VITAL SIGNS:  Blood pressure initially was 151/87.  Initial pulse rate was  142.  With treatment, the pulse dropped to 70.  Respiratory rate 22.  OTOLARYNGOLOGIC:  Unremarkable.  Thyroid is normal to palpation.  She has no  carotid bruits.  CHEST:  Clear to auscultation.  She has a soft S4.  ABDOMEN:  Nontender without organomegaly or masses.  EXTREMITIES:  Pedal pulses are intact.  Homan's sign is negative.  NEUROLOGIC:  There are no localizing neuropsychiatric findings.  She is very  quiet and well spoken and appears to be well educated.   LABORATORY DATA:  Abnormal labs include a glucose of 107.  Enzymes are  negative.  EKG reveals a right bundle branch block.   PLAN:  She is admitted to observation for chest pain to rule out acute  infarction.  If this is negative, a stress Cardiolite will be scheduled as  an outpatient.  The Fosamax will be held as it may be contributing to her  symptoms.       WFH/MEDQ  D:  09/05/2004  T:  09/05/2004  Job:   [id number]   cc:   Stacie Glaze, M.D. [facility]   Molly Maduro D. Arlyce Dice, M.D. [facility]

## 2011-02-07 NOTE — Discharge Summary (Signed)
NAMECHISOM, MUNTEAN                 ACCOUNT NO.:  [id number]   MEDICAL RECORD NO.:  [id number]          PATIENT TYPE:  OBV   LOCATION:  3704                         FACILITY:  [facility]   PHYSICIAN:  Rene Paci, M.D. LHCDATE OF BIRTH:  04/29/1934   DATE OF ADMISSION:  09/05/2004  DATE OF DISCHARGE:  09/06/2004                                 DISCHARGE SUMMARY   DISCHARGE DIAGNOSES:  1.  Atypical chest pain.  2.  Reflux disease.   BRIEF ADMISSION HISTORY:  Ms. Haberl is a 77 year old white female who  describes intermittent chest pain for approximately 7 days.  She rates it at  a 9 on a scale of 1/10.  She describes some radiation to her thoracic spine.  In the emergency room, she received some relief with morphine.  CT was  negative for a dissection aneurysm.   PAST MEDICAL HISTORY:  1.  Long history of esophageal reflux currently on Nexium.  2.  Osteoporosis on Fosamax.  3.  Remote history of ovarian cancer status post total abdominal      hysterectomy and bilateral salpingo-oophorectomy in 1980 with a follow      up exploratory laparotomy a couple of years later for a second look.   HOSPITAL COURSE:  Problem 1.  CARDIOVASCULAR:  The patient presented with  atypical chest pain.  Serial cardiac enzymes were negative.  EKG was without  ischemia.  The patient did rule out.  We did schedule the patient for an  outpatient treadmill Cardiolite.  This is probably likely esophageal reflux  and the patient's Nexium has been increased to b.i.d.  For now, we will also  have her hold her Fosamax.   FOLLOW UP:  With Dr. Lovell Sheehan in about 2 weeks.  Follow up for a treadmill  Cardiolite Wednesday, December 21st, at 9:30 a.m.      Laur   LC/MEDQ  D:  09/06/2004  T:  09/07/2004  Job:  [id number]   cc:   Stacie Glaze, M.D. [facility]

## 2011-05-28 ENCOUNTER — Telehealth: Payer: Self-pay | Admitting: Internal Medicine

## 2011-05-28 NOTE — Telephone Encounter (Signed)
Pt called and has been having discomfort near belly button for about 3 wks. This pain runs up into pts ribs. Pt refuses to see another doctor. Req work in ov with Dr Lovell Sheehan only.

## 2011-06-05 NOTE — Telephone Encounter (Signed)
Ov given for 9/18 and pt informed

## 2011-06-10 ENCOUNTER — Ambulatory Visit (INDEPENDENT_AMBULATORY_CARE_PROVIDER_SITE_OTHER): Payer: Medicare Other | Admitting: Internal Medicine

## 2011-06-10 ENCOUNTER — Encounter: Payer: Self-pay | Admitting: Internal Medicine

## 2011-06-10 VITALS — BP 134/80 | HR 72 | Temp 98.2°F | Resp 16 | Ht 69.0 in | Wt 146.0 lb

## 2011-06-10 DIAGNOSIS — K219 Gastro-esophageal reflux disease without esophagitis: Secondary | ICD-10-CM

## 2011-06-10 DIAGNOSIS — I714 Abdominal aortic aneurysm, without rupture: Secondary | ICD-10-CM

## 2011-06-10 DIAGNOSIS — K5901 Slow transit constipation: Secondary | ICD-10-CM

## 2011-06-10 DIAGNOSIS — R1013 Epigastric pain: Secondary | ICD-10-CM

## 2011-06-10 MED ORDER — ESOMEPRAZOLE MAGNESIUM 40 MG PO CPDR: 40.0000 mg | DELAYED_RELEASE_CAPSULE | Freq: Every day | ORAL | Status: DC

## 2011-06-10 NOTE — Progress Notes (Signed)
  Subjective:    Patient ID: Cindy Schwartz, female    DOB: 05-27-1934, 77 y.o.   MRN: [id number]  HPI Pt has extreme health related anxiety Today she has increased pain n he upper epigastrum that radiates to the left and sometimes to the back. SHe rates it as 5/10 and lasts 4-5 min. She cannot related to food. Sh has noted increased constipation Hx of IBS Hx of thoracis anurysm  Review of Systems  Constitutional: Negative for activity change, appetite change and fatigue.  HENT: Negative for ear pain, congestion, neck pain, postnasal drip and sinus pressure.   Eyes: Negative for redness and visual disturbance.  Respiratory: Negative for cough, shortness of breath and wheezing.   Gastrointestinal: Positive for abdominal pain, constipation and abdominal distention.  Genitourinary: Negative for dysuria, frequency and menstrual problem.  Musculoskeletal: Positive for arthralgias. Negative for myalgias and joint swelling.       Right shoulder pain  Skin: Negative for rash and wound.  Neurological: Negative for dizziness, weakness and headaches.  Hematological: Negative for adenopathy. Does not bruise/bleed easily.  Psychiatric/Behavioral: Negative for sleep disturbance and decreased concentration.   Past Medical History  Diagnosis Date  . Osteoporosis   . Aneurysm of ascending aorta   . History of ovarian cancer   . Raynaud's disease   . GERD (gastroesophageal reflux disease)   . Insomnia    Past Surgical History  Procedure Date  . Ovarian ca s/p taj/bso 1979  . Abdominal hysterectomy   . Appendectomy   . Bilateral salpingoophorectomy     reports that she has never smoked. She does not have any smokeless tobacco history on file. Her alcohol and drug histories not on file. family history is not on file. Not on File     Objective:   Physical Exam  Nursing note and vitals reviewed. Constitutional: She is oriented to person, place, and time. She appears well-developed and  well-nourished. No distress.  HENT:  Head: Normocephalic and atraumatic.  Right Ear: External ear normal.  Left Ear: External ear normal.  Nose: Nose normal.  Mouth/Throat: Oropharynx is clear and moist.  Eyes: Conjunctivae and EOM are normal. Pupils are equal, round, and reactive to light.  Neck: Normal range of motion. Neck supple. No JVD present. No tracheal deviation present. No thyromegaly present.  Cardiovascular: Normal rate, regular rhythm, normal heart sounds and intact distal pulses.   No murmur heard. Pulmonary/Chest: Effort normal and breath sounds normal. She has no wheezes. She exhibits no tenderness.  Abdominal: Soft. Bowel sounds are normal. She exhibits no distension and no mass. There is tenderness. There is no rebound and no guarding.       palpable aorta not pulsitile  Musculoskeletal: Normal range of motion. She exhibits no edema and no tenderness.  Lymphadenopathy:    She has no cervical adenopathy.  Neurological: She is alert and oriented to person, place, and time. She has normal reflexes. No cranial nerve deficit.  Skin: Skin is warm and dry. She is not diaphoretic.  Psychiatric: She has a normal mood and affect. Her behavior is normal.          Assessment & Plan:  Abdominal pain differential is AAA ( she has a hx of a thoracic one) vs constipation [place] of aorta Begin senokot monitor for relief Hx of IBS

## 2011-06-10 NOTE — Progress Notes (Signed)
Addended by: Stacie Glaze MD E on: 06/10/2011 03:49 PM   Modules accepted: Orders

## 2011-06-10 NOTE — Patient Instructions (Signed)
You will be called for the [place]

## 2011-06-13 ENCOUNTER — Ambulatory Visit
Admission: RE | Admit: 2011-06-13 | Discharge: 2011-06-13 | Disposition: A | Discharge status: Home / Self Care | Payer: Medicare Other | Source: Ambulatory Visit | Attending: Internal Medicine | Admitting: Internal Medicine

## 2011-06-13 DIAGNOSIS — K5901 Slow transit constipation: Secondary | ICD-10-CM

## 2011-06-13 DIAGNOSIS — R1013 Epigastric pain: Secondary | ICD-10-CM

## 2011-06-13 DIAGNOSIS — I714 Abdominal aortic aneurysm, without rupture: Secondary | ICD-10-CM

## 2011-06-17 ENCOUNTER — Telehealth: Payer: Self-pay | Admitting: Internal Medicine

## 2011-06-17 NOTE — Telephone Encounter (Signed)
Per dr Lovell Sheehan- wnl- pt informed

## 2011-06-17 NOTE — Telephone Encounter (Signed)
Pt called req ultrasound results. Pls call back asap.

## 2011-06-26 ENCOUNTER — Ambulatory Visit (INDEPENDENT_AMBULATORY_CARE_PROVIDER_SITE_OTHER): Payer: Medicare Other | Admitting: Internal Medicine

## 2011-06-26 VITALS — BP 120/80 | HR 72 | Temp 98.0°F | Resp 16 | Ht 69.0 in | Wt 145.0 lb

## 2011-06-26 DIAGNOSIS — E785 Hyperlipidemia, unspecified: Secondary | ICD-10-CM

## 2011-06-26 DIAGNOSIS — K589 Irritable bowel syndrome without diarrhea: Secondary | ICD-10-CM

## 2011-06-26 DIAGNOSIS — Z23 Encounter for immunization: Secondary | ICD-10-CM

## 2011-06-26 DIAGNOSIS — R1084 Generalized abdominal pain: Secondary | ICD-10-CM

## 2011-06-26 LAB — CHOLESTEROL, TOTAL: Cholesterol: 216 mg/dL — ABNORMAL HIGH (ref 0–200)

## 2011-06-26 MED ORDER — ALIGN 4 MG PO CAPS: 4.0000 | ORAL_CAPSULE | Freq: Every day | ORAL | Status: DC

## 2011-06-26 MED ORDER — PSYLLIUM 28 % PO PACK: 1.0000 | PACK | Freq: Two times a day (BID) | ORAL | Status: DC

## 2011-06-26 NOTE — Patient Instructions (Signed)
Take the metamucil twice a day and the align once a day  Before bed

## 2011-06-27 ENCOUNTER — Ambulatory Visit: Payer: Medicare Other | Admitting: Internal Medicine

## 2011-09-22 ENCOUNTER — Encounter: Payer: Self-pay | Admitting: Internal Medicine

## 2011-09-22 NOTE — Progress Notes (Signed)
Subjective:    Patient ID: Cindy Schwartz, female    DOB: May 14, 1934, 77 y.o.   MRN: [id number]  HPI Cindy Schwartz  is a 77 year old white female who continues to have abdominal pain of undetermined etiology.  She has chronic anxiety irritable bowel esophageal reflux and has had an evaluation in the past abdominal pain without significant identifiable pathology. She has had a CT scan which was normal in 2011 and repeat ultrasound.  She does have an issue with constipation and irritable bowel but her anxiety does not allow her to accept these diagnoses as etiology for her abdominal discomfort   Review of Systems  Constitutional: Negative for activity change, appetite change and fatigue.  HENT: Negative for ear pain, congestion, neck pain, postnasal drip and sinus pressure.   Eyes: Negative for redness and visual disturbance.  Respiratory: Negative for cough, shortness of breath and wheezing.   Gastrointestinal: Negative for abdominal pain and abdominal distention.  Genitourinary: Negative for dysuria, frequency and menstrual problem.  Musculoskeletal: Negative for myalgias, joint swelling and arthralgias.  Skin: Negative for rash and wound.  Neurological: Negative for dizziness, weakness and headaches.  Hematological: Negative for adenopathy. Does not bruise/bleed easily.  Psychiatric/Behavioral: Negative for sleep disturbance and decreased concentration.   Past Medical History  Diagnosis Date  . Osteoporosis   . Aneurysm of ascending aorta   . History of ovarian cancer   . Raynaud's disease   . GERD (gastroesophageal reflux disease)   . Insomnia     History   Social History  . Marital Status: Married    Spouse Name: N/A    Number of Children: N/A  . Years of Education: N/A   Occupational History  . retired    Social History Main Topics  . Smoking status: Never Smoker   . Smokeless tobacco: Not on file  . Alcohol Use: Not on file  . Drug Use: Not on file  . Sexually Active:  Not on file   Other Topics Concern  . Not on file   Social History Narrative  . No narrative on file    Past Surgical History  Procedure Date  . Ovarian ca s/p taj/bso 1979  . Abdominal hysterectomy   . Appendectomy   . Bilateral salpingoophorectomy     No family history on file.  No Known Allergies  Current Outpatient Prescriptions on File Prior to Visit  Medication Sig Dispense Refill  . calcium-vitamin D 250-100 MG-UNIT per tablet Take 1 tablet by mouth daily.        . cetirizine (ZYRTEC) 10 MG tablet Take 10 mg by mouth daily as needed.       . Cholecalciferol (VITAMIN D) 1000 UNITS capsule Take 1,000 Units by mouth daily.        Marland Kitchen esomeprazole (NEXIUM) 40 MG capsule Take 1 capsule (40 mg total) by mouth daily before breakfast.  90 capsule  3  . Magnesium 300 MG CAPS Take by mouth daily.        . Multiple Vitamin (MULTIVITAMIN) capsule Take 1 capsule by mouth daily.        . sertraline (ZOLOFT) 25 MG tablet Take 25 mg by mouth daily.        . valACYclovir (VALTREX) 500 MG tablet Take 1 tablet (500 mg total) by mouth daily as needed (PT REQUEST NAME BRAND-NO GENERICS).  30 tablet  2    BP 120/80  Pulse 72  Temp 98 F (36.7 C)  Resp 16  Ht 5\' 9"  (1.753 m)  Wt 145 lb (65.772 kg)  BMI 21.41 kg/m2       Objective:   Physical Exam  Nursing note and vitals reviewed. Constitutional: She is oriented to person, place, and time. She appears well-developed and well-nourished. No distress.  HENT:  Head: Normocephalic and atraumatic.  Right Ear: External ear normal.  Left Ear: External ear normal.  Nose: Nose normal.  Mouth/Throat: Oropharynx is clear and moist.  Eyes: Conjunctivae and EOM are normal. Pupils are equal, round, and reactive to light.  Neck: Normal range of motion. Neck supple. No JVD present. No tracheal deviation present. No thyromegaly present.  Cardiovascular: Normal rate, regular rhythm, normal heart sounds and intact distal pulses.   No murmur  heard. Pulmonary/Chest: Effort normal and breath sounds normal. She has no wheezes. She exhibits no tenderness.  Abdominal: Soft. Bowel sounds are normal.  Musculoskeletal: Normal range of motion. She exhibits no edema and no tenderness.  Lymphadenopathy:    She has no cervical adenopathy.  Neurological: She is alert and oriented to person, place, and time. She has normal reflexes. No cranial nerve deficit.  Skin: Skin is warm and dry. She is not diaphoretic.  Psychiatric: She has a normal mood and affect. Her behavior is normal.          Assessment & Plan:  The patient has a generalized anxiety disorder which creates a great deal of anxiety when she has abdominal pain from her aorta bowel she's had a workup by gastroenterology a CT scan and ultrasound all normal encouraged her to focus on exercise proper diet including high fiber and a probiotic  I have spent more than 30 minutes examining this patient face-to-face of which over half was spent in counseling

## 2011-09-26 DIAGNOSIS — H40059 Ocular hypertension, unspecified eye: Secondary | ICD-10-CM | POA: Diagnosis not present

## 2011-10-17 ENCOUNTER — Ambulatory Visit: Payer: Medicare Other | Admitting: Internal Medicine

## 2011-10-20 ENCOUNTER — Other Ambulatory Visit: Payer: Self-pay | Admitting: Obstetrics and Gynecology

## 2011-10-20 ENCOUNTER — Other Ambulatory Visit (HOSPITAL_COMMUNITY): Payer: Self-pay | Admitting: Obstetrics and Gynecology

## 2011-10-20 DIAGNOSIS — Z1231 Encounter for screening mammogram for malignant neoplasm of breast: Secondary | ICD-10-CM

## 2011-11-05 ENCOUNTER — Ambulatory Visit (INDEPENDENT_AMBULATORY_CARE_PROVIDER_SITE_OTHER): Payer: Medicare Other | Admitting: Internal Medicine

## 2011-11-05 ENCOUNTER — Encounter: Payer: Self-pay | Admitting: Internal Medicine

## 2011-11-05 VITALS — BP 130/78 | HR 72 | Temp 98.3°F | Resp 16 | Ht 69.0 in | Wt 142.0 lb

## 2011-11-05 DIAGNOSIS — K219 Gastro-esophageal reflux disease without esophagitis: Secondary | ICD-10-CM

## 2011-11-05 DIAGNOSIS — R5383 Other fatigue: Secondary | ICD-10-CM | POA: Diagnosis not present

## 2011-11-05 DIAGNOSIS — M546 Pain in thoracic spine: Secondary | ICD-10-CM

## 2011-11-05 DIAGNOSIS — K589 Irritable bowel syndrome without diarrhea: Secondary | ICD-10-CM

## 2011-11-05 DIAGNOSIS — R5381 Other malaise: Secondary | ICD-10-CM | POA: Diagnosis not present

## 2011-11-05 DIAGNOSIS — E785 Hyperlipidemia, unspecified: Secondary | ICD-10-CM | POA: Diagnosis not present

## 2011-11-05 DIAGNOSIS — R1013 Epigastric pain: Secondary | ICD-10-CM | POA: Diagnosis not present

## 2011-11-05 LAB — CBC WITH DIFFERENTIAL/PLATELET
Basophils Relative: 0.8 % (ref 0.0–3.0)
Eosinophils Relative: 5.2 % — ABNORMAL HIGH (ref 0.0–5.0)
HCT: 41 % (ref 36.0–46.0)
Lymphs Abs: 1.6 10*3/uL (ref 0.7–4.0)
MCV: 87 fl (ref 78.0–100.0)
Monocytes Absolute: 0.6 10*3/uL (ref 0.1–1.0)
Platelets: 201 10*3/uL (ref 150.0–400.0)
WBC: 5.4 10*3/uL (ref 4.5–10.5)

## 2011-11-05 LAB — LIPID PANEL
Cholesterol: 195 mg/dL (ref 0–200)
LDL Cholesterol: 118 mg/dL — ABNORMAL HIGH (ref 0–99)
Total CHOL/HDL Ratio: 3

## 2011-11-05 NOTE — Progress Notes (Signed)
Subjective:    Patient ID: Cindy Schwartz, female    DOB: Apr 30, 1934, 78 y.o.   MRN: [id number]  HPI The patient was seen by gastroenterology a vehicle.  She was confirmed with the diagnosis of irritable bowel she is still on a probiotic but she still has crampy abdominal pain that diffusely involves a large part of her abdominal area.  She was placed on dicyclomine or Bentyl and she is unable to tolerate this medicine due to to the altered level status sleepiness and grogginess that causes she feels that she is unsafe for her to drive  Review of Systems  Constitutional: Negative for activity change, appetite change and fatigue.  HENT: Negative for ear pain, congestion, neck pain, postnasal drip and sinus pressure.   Eyes: Negative for redness and visual disturbance.  Respiratory: Negative for cough, shortness of breath and wheezing.   Gastrointestinal: Negative for abdominal pain and abdominal distention.  Genitourinary: Negative for dysuria, frequency and menstrual problem.  Musculoskeletal: Negative for myalgias, joint swelling and arthralgias.  Skin: Negative for rash and wound.  Neurological: Negative for dizziness, weakness and headaches.  Hematological: Negative for adenopathy. Does not bruise/bleed easily.  Psychiatric/Behavioral: Negative for sleep disturbance and decreased concentration.   Past Medical History  Diagnosis Date  . Osteoporosis   . Aneurysm of ascending aorta   . History of ovarian cancer   . Raynaud's disease   . GERD (gastroesophageal reflux disease)   . Insomnia     History   Social History  . Marital Status: Married    Spouse Name: N/A    Number of Children: N/A  . Years of Education: N/A   Occupational History  . retired    Social History Main Topics  . Smoking status: Never Smoker   . Smokeless tobacco: Not on file  . Alcohol Use: Not on file  . Drug Use: Not on file  . Sexually Active: Not on file   Other Topics Concern  . Not on file     Social History Narrative  . No narrative on file    Past Surgical History  Procedure Date  . Ovarian ca s/p taj/bso 1979  . Abdominal hysterectomy   . Appendectomy   . Bilateral salpingoophorectomy     No family history on file.  No Known Allergies  Current Outpatient Prescriptions on File Prior to Visit  Medication Sig Dispense Refill  . calcium-vitamin D 250-100 MG-UNIT per tablet Take 1 tablet by mouth daily.        . cetirizine (ZYRTEC) 10 MG tablet Take 10 mg by mouth daily as needed.       . Cholecalciferol (VITAMIN D) 1000 UNITS capsule Take 1,000 Units by mouth daily.        Marland Kitchen esomeprazole (NEXIUM) 40 MG capsule Take 1 capsule (40 mg total) by mouth daily before breakfast.  90 capsule  3  . Magnesium 300 MG CAPS Take by mouth daily.        . Multiple Vitamin (MULTIVITAMIN) capsule Take 1 capsule by mouth daily.        . Probiotic Product (ALIGN) 4 MG CAPS Take 4 capsules by mouth daily.      . psyllium (METAMUCIL SMOOTH TEXTURE) 28 % packet Take 1 packet by mouth 2 (two) times daily.  60 tablet  11  . sertraline (ZOLOFT) 25 MG tablet Take 25 mg by mouth daily.        . valACYclovir (VALTREX) 500 MG tablet Take 1 tablet (500 mg  total) by mouth daily as needed (PT REQUEST NAME BRAND-NO GENERICS).  30 tablet  2    BP 130/78  Pulse 72  Temp 98.3 F (36.8 C)  Resp 16  Ht 5\' 9"  (1.753 m)  Wt 142 lb (64.411 kg)  BMI 20.97 kg/m2       Objective:   Physical Exam  Nursing note and vitals reviewed. Constitutional: She is oriented to person, place, and time. She appears well-developed and well-nourished.  HENT:  Head: Normocephalic and atraumatic.  Eyes: Conjunctivae are normal. Pupils are equal, round, and reactive to light.  Neck: Normal range of motion. Neck supple.  Abdominal: Soft. Bowel sounds are normal. She exhibits distension. There is tenderness.  Neurological: She is alert and oriented to person, place, and time.  Skin: Skin is warm and dry.           Assessment & Plan:  Patient has persistent abdominal pain this pain appears to have more than one etiology she has upper thoracic abdominal pain radiating under her rib cage that is positional but may be due to her back and she has mid and lower abdominal pain in primarily the left upper and lower quadrants is associated with more irritable bowel type symptoms.  At this 2 generalized anxiety syndrome and she has the texture for a great deal of worry about her abdominal pain.  She was unable to tolerate the dose of Bentyl given by the gastroenterologist which was 20 mg 4 times a day and this made her feel groggy sleepy and drug I have instructed her to go back to 10 mg twice daily as a trial and then we'll gradually try to increase it from there to control her symptoms as well as we have made a referral to orthopedic surgery to consider a thoracic pain injection.

## 2011-11-05 NOTE — Patient Instructions (Addendum)
The patient is instructed to continue all medications as prescribed. Schedule followup with check out clerk upon leaving the clinic Try one half of a 20 mg bentyl twice daily to start and see if that will help with the abdominal discomfort

## 2011-11-26 ENCOUNTER — Ambulatory Visit
Admission: RE | Admit: 2011-11-26 | Discharge: 2011-11-26 | Disposition: A | Discharge status: Home / Self Care | Payer: Medicare Other | Source: Ambulatory Visit | Attending: Obstetrics and Gynecology | Admitting: Obstetrics and Gynecology

## 2011-11-26 DIAGNOSIS — Z1231 Encounter for screening mammogram for malignant neoplasm of breast: Secondary | ICD-10-CM | POA: Diagnosis not present

## 2011-12-09 DIAGNOSIS — M545 Low back pain: Secondary | ICD-10-CM | POA: Diagnosis not present

## 2011-12-18 ENCOUNTER — Encounter: Payer: Self-pay | Admitting: Internal Medicine

## 2011-12-18 ENCOUNTER — Ambulatory Visit (INDEPENDENT_AMBULATORY_CARE_PROVIDER_SITE_OTHER): Payer: Medicare Other | Admitting: Internal Medicine

## 2011-12-18 VITALS — BP 110/74 | HR 77 | Temp 97.8°F | Wt 145.0 lb

## 2011-12-18 DIAGNOSIS — Z8619 Personal history of other infectious and parasitic diseases: Secondary | ICD-10-CM | POA: Diagnosis not present

## 2011-12-18 DIAGNOSIS — B029 Zoster without complications: Secondary | ICD-10-CM | POA: Diagnosis not present

## 2011-12-18 DIAGNOSIS — R21 Rash and other nonspecific skin eruption: Secondary | ICD-10-CM

## 2011-12-18 MED ORDER — VALACYCLOVIR HCL 1 G PO TABS: 1000.0000 mg | ORAL_TABLET | Freq: Three times a day (TID) | ORAL | Status: DC

## 2011-12-18 NOTE — Progress Notes (Signed)
  Subjective:    Patient ID: Cindy Schwartz, female    DOB: 10-30-1933, 78 y.o.   MRN: [id number]  HPI Patient comes in today for SDA for  new problem evaluation. Yesterday had itchy burning feeling right upper shoulder area and then radiating area under right axilla and upper arm .  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     Tends to get itchy rashes various area of body  . Has ? About shingles.   Review of Systems Neg fever  Never had shingles  Past history family history social history reviewed in the electronic medical record.      Objective:   Physical Exam  WDWN in nad  Neck clear   No adenopathy or rash  SKIN small 2 cm patch right shoulder area  Red some exoriation but no vesicle or weeping.   axill a is clear and no adenopathy . Good rom Looks well       Assessment & Plan:  Rash and neuritis type sx.  Sx better  today than yesterday . Uncertain cause  Could be early shingles  Hx of hsv  Counseled.    Ok to do empiric  Valtrex asks for  Branded.  Reviewed  pictures with her  and management of  Poss zoxter vs HSV   Zoster is usually not very recurrent   like simplex  .  Can spread along dermatome

## 2011-12-18 NOTE — Patient Instructions (Signed)
Could be early shingles  Can wait and if pain and rash progress take valtrex  For a week.    Shingles Shingles is caused by the same virus that causes chickenpox (varicella zoster virus or VZV). Shingles often occurs many years or decades after having chickenpox. That is why it is more common in adults older than 50 years. The virus reactivates and breaks out as an infection in a nerve root. SYMPTOMS   The initial feeling (sensations) may be pain. This pain is usually described as:   Burning.   Stabbing.   Throbbing.   Tingling in the nerve root.   A red rash will follow in a couple days. The rash may occur in any area of the body and is usually on one side (unilateral) of the body in a band or belt-like pattern. The rash usually starts out as very small blisters (vesicles). They will dry up after 7 to 10 days. This is not usually a significant problem except for the pain it causes.   Long-lasting (chronic) pain is more likely in an elderly person. It can last months to years. This condition is called postherpetic neuralgia.  Shingles can be an extremely severe infection in someone with AIDS, a weakened immune system, or with forms of leukemia. It can also be severe if you are taking transplant medicines or other medicines that weaken the immune system. TREATMENT  Your caregiver will often treat you with:  Antiviral drugs.   Anti-inflammatory drugs.   Pain medicines.  Bed rest is very important in preventing the pain associated with herpes zoster (postherpetic neuralgia). Application of heat in the form of a hot water bottle or electric heating pad or gentle pressure with the hand is recommended to help with the pain or discomfort. PREVENTION  A varicella zoster vaccine is available to help protect against the virus. The Food and Drug Administration approved the varicella zoster vaccine for individuals 46 years of age and older. HOME CARE INSTRUCTIONS   Cool compresses to the area  of rash may be helpful.   Only take over-the-counter or prescription medicines for pain, discomfort, or fever as directed by your caregiver.   Avoid contact with:   Babies.   Pregnant women.   Children with eczema.   Elderly people with transplants.   People with chronic illnesses, such as leukemia and AIDS.   If the area involved is on your face, you may receive a referral for follow-up to a specialist. It is very important to keep all follow-up appointments. This will help avoid eye complications, chronic pain, or disability.  SEEK IMMEDIATE MEDICAL CARE IF:   You develop any pain (headache) in the area of the face or eye. This must be followed carefully by your caregiver or ophthalmologist. An infection in part of your eye (cornea) can be very serious. It could lead to blindness.   You do not have pain relief from prescribed medicines.   Your redness or swelling spreads.   The area involved becomes very swollen and painful.   You have a fever.   You notice any red or painful lines extending away from the affected area toward your heart (lymphangitis).   Your condition is worsening or has changed.  Document Released: 09/08/2005 Document Revised: 08/28/2011 Document Reviewed: 08/13/2009 [facility] Patient Information 2012 [place], [place].

## 2012-01-16 ENCOUNTER — Encounter: Payer: Self-pay | Admitting: Internal Medicine

## 2012-01-16 ENCOUNTER — Ambulatory Visit (INDEPENDENT_AMBULATORY_CARE_PROVIDER_SITE_OTHER): Payer: Medicare Other | Admitting: Internal Medicine

## 2012-01-16 VITALS — BP 122/78 | HR 72 | Temp 98.2°F | Resp 16 | Ht 69.0 in | Wt 144.0 lb

## 2012-01-16 DIAGNOSIS — I8390 Asymptomatic varicose veins of unspecified lower extremity: Secondary | ICD-10-CM

## 2012-01-16 DIAGNOSIS — M25569 Pain in unspecified knee: Secondary | ICD-10-CM

## 2012-01-16 DIAGNOSIS — R1032 Left lower quadrant pain: Secondary | ICD-10-CM

## 2012-01-16 NOTE — Patient Instructions (Signed)
The patient is instructed to continue all medications as prescribed. Schedule followup with check out clerk upon leaving the clinic  

## 2012-01-16 NOTE — Progress Notes (Signed)
Subjective:    Patient ID: Cindy Schwartz, female    DOB: 1934/02/13, 78 y.o.   MRN: [id number]  HPI  The pt has been to [facility] and there is a plan for vein treatment. She has significant pain in her calf that has bee attributed to her veins. She wonders if the pain may either be her veins or her back.  The pain is located more in the calf of the left lower leg and at this also interferes with sleep due to the aching sensation.  The differential diagnosis also includes restless leg syndrome. She takes magnesium for leg cramping and this has not helped this pain.  I think he would be appropriate to do a second opinion from a vascular surgeon to see if her venous disease is the probable cause of her leg pain I think a trial of a restless leg medication may also be warranted to see if the pain is coming from a neuropathic source    Review of Systems  Constitutional: Negative for activity change, appetite change and fatigue.  HENT: Negative for ear pain, congestion, neck pain, postnasal drip and sinus pressure.   Eyes: Negative for redness and visual disturbance.  Respiratory: Negative for cough, shortness of breath and wheezing.   Gastrointestinal: Negative for abdominal pain and abdominal distention.  Genitourinary: Negative for dysuria, frequency and menstrual problem.  Musculoskeletal: Negative for myalgias, joint swelling and arthralgias.  Skin: Negative for rash and wound.  Neurological: Negative for dizziness, weakness and headaches.  Hematological: Negative for adenopathy. Does not bruise/bleed easily.  Psychiatric/Behavioral: Negative for sleep disturbance and decreased concentration.   Past Medical History  Diagnosis Date  . Osteoporosis   . Aneurysm of ascending aorta   . History of ovarian cancer   . Raynaud's disease   . GERD (gastroesophageal reflux disease)   . Insomnia     History   Social History  . Marital Status: Married    Spouse Name: N/A   Number of Children: N/A  . Years of Education: N/A   Occupational History  . retired    Social History Main Topics  . Smoking status: Never Smoker   . Smokeless tobacco: Not on file  . Alcohol Use: Not on file  . Drug Use: Not on file  . Sexually Active: Not on file   Other Topics Concern  . Not on file   Social History Narrative  . No narrative on file    Past Surgical History  Procedure Date  . Ovarian ca s/p taj/bso 1979  . Abdominal hysterectomy   . Appendectomy   . Bilateral salpingoophorectomy     No family history on file.  No Known Allergies  Current Outpatient Prescriptions on File Prior to Visit  Medication Sig Dispense Refill  . calcium-vitamin D 250-100 MG-UNIT per tablet Take 1 tablet by mouth daily.        . cetirizine (ZYRTEC) 10 MG tablet Take 10 mg by mouth daily as needed.       . Cholecalciferol (VITAMIN D) 1000 UNITS capsule Take 1,000 Units by mouth daily.        Marland Kitchen esomeprazole (NEXIUM) 40 MG capsule Take 1 capsule (40 mg total) by mouth daily before breakfast.  90 capsule  3  . fish oil-omega-3 fatty acids 1000 MG capsule Take 2 g by mouth daily.      . Magnesium 300 MG CAPS Take by mouth daily.        . Multiple Vitamin (MULTIVITAMIN)  capsule Take 1 capsule by mouth daily.        . Probiotic Product (ALIGN) 4 MG CAPS Take 4 capsules by mouth daily.      . valACYclovir (VALTREX) 500 MG tablet Take 1 tablet (500 mg total) by mouth daily as needed (PT REQUEST NAME BRAND-NO GENERICS).  30 tablet  2    BP 122/78  Pulse 72  Temp 98.2 F (36.8 C)  Resp 16  Ht 5\' 9"  (1.753 m)  Wt 144 lb (65.318 kg)  BMI 21.27 kg/m2        Objective:   Physical Exam  Nursing note and vitals reviewed. Constitutional: She is oriented to person, place, and time. She appears well-developed and well-nourished. No distress.  HENT:  Head: Normocephalic and atraumatic.  Right Ear: External ear normal.  Left Ear: External ear normal.  Nose: Nose normal.    Mouth/Throat: Oropharynx is clear and moist.  Eyes: Conjunctivae and EOM are normal. Pupils are equal, round, and reactive to light.  Neck: Normal range of motion. Neck supple. No JVD present. No tracheal deviation present. No thyromegaly present.  Cardiovascular: Normal rate, regular rhythm, normal heart sounds and intact distal pulses.   No murmur heard. Pulmonary/Chest: Effort normal and breath sounds normal. She has no wheezes. She exhibits no tenderness.  Abdominal: Soft. Bowel sounds are normal.  Musculoskeletal: Normal range of motion. She exhibits no edema and no tenderness.       si joint pain  Lymphadenopathy:    She has no cervical adenopathy.  Neurological: She is alert and oriented to person, place, and time. She has normal reflexes. A cranial nerve deficit is present.  Skin: Skin is warm and dry. She is not diaphoretic.       Mild varicosities without significant edema  Psychiatric: She has a normal mood and affect. Her behavior is normal.    On examination of the legs she has negative Homans sign and no cording or tenderness or erythema detected      Assessment & Plan:  Persistent pain in the left lower quadrant... Was sent by the pain specialist Dr Ethelene Hal for ?MRI of of the abdomin. She has also been to the GI consultant who recommended a colon ( last colon was done 6 years ago)  The best path forward would be the colon to assess diverticula. If the pt feels secure with the diagnosis she is "ok" with the mild pain.  [facility]

## 2012-01-20 ENCOUNTER — Other Ambulatory Visit: Payer: Self-pay

## 2012-01-20 DIAGNOSIS — I83893 Varicose veins of bilateral lower extremities with other complications: Secondary | ICD-10-CM

## 2012-03-01 DIAGNOSIS — M810 Age-related osteoporosis without current pathological fracture: Secondary | ICD-10-CM | POA: Diagnosis not present

## 2012-03-01 DIAGNOSIS — B379 Candidiasis, unspecified: Secondary | ICD-10-CM | POA: Diagnosis not present

## 2012-03-04 ENCOUNTER — Other Ambulatory Visit: Payer: Self-pay | Admitting: Physical Medicine and Rehabilitation

## 2012-03-04 DIAGNOSIS — Z8543 Personal history of malignant neoplasm of ovary: Secondary | ICD-10-CM

## 2012-03-04 DIAGNOSIS — R109 Unspecified abdominal pain: Secondary | ICD-10-CM

## 2012-03-08 ENCOUNTER — Encounter: Payer: Self-pay | Admitting: Vascular Surgery

## 2012-03-08 DIAGNOSIS — R1084 Generalized abdominal pain: Secondary | ICD-10-CM | POA: Diagnosis not present

## 2012-03-09 ENCOUNTER — Encounter (INDEPENDENT_AMBULATORY_CARE_PROVIDER_SITE_OTHER): Payer: Medicare Other | Admitting: *Deleted

## 2012-03-09 ENCOUNTER — Encounter: Payer: Self-pay | Admitting: Vascular Surgery

## 2012-03-09 ENCOUNTER — Ambulatory Visit (INDEPENDENT_AMBULATORY_CARE_PROVIDER_SITE_OTHER): Payer: Medicare Other | Admitting: Vascular Surgery

## 2012-03-09 VITALS — BP 133/79 | HR 57 | Resp 18 | Ht 68.0 in | Wt 143.0 lb

## 2012-03-09 DIAGNOSIS — I83893 Varicose veins of bilateral lower extremities with other complications: Secondary | ICD-10-CM | POA: Diagnosis not present

## 2012-03-09 NOTE — Progress Notes (Signed)
Subjective:     Patient ID: Cindy Schwartz, female   DOB: 07/06/1934, 78 y.o.   MRN: [id number]  HPI this 78 year old female is referred for evaluation of venous disease in both legs. She has no history of DVT, thrombophlebitis, stasis ulcers, bleeding, or other serious problems. She has been evaluated in the [facility]. It was recommended that she have laser ablation of both great saphenous veins. She has had sclerotherapy performed in that facility in the past. She came today for a second opinion. She has not been wearing elastic compression stockings. She has discomfort in her legs particularly at night. If she gets up and ambulates the symptoms improved.  Past Medical History  Diagnosis Date  . Osteoporosis   . Aneurysm of ascending aorta   . History of ovarian cancer   . Raynaud's disease   . GERD (gastroesophageal reflux disease)   . Insomnia     History  Substance Use Topics  . Smoking status: Never Smoker   . Smokeless tobacco: Never Used  . Alcohol Use: No    No family history on file.  No Known Allergies  Current outpatient prescriptions:calcium-vitamin D 250-100 MG-UNIT per tablet, Take 1 tablet by mouth daily.  , Disp: , Rfl: ;  cetirizine (ZYRTEC) 10 MG tablet, Take 10 mg by mouth daily as needed. , Disp: , Rfl: ;  Cholecalciferol (VITAMIN D) 1000 UNITS capsule, Take 1,000 Units by mouth daily.  , Disp: , Rfl: ;  esomeprazole (NEXIUM) 40 MG capsule, Take 1 capsule (40 mg total) by mouth daily before breakfast., Disp: 90 capsule, Rfl: 3 fish oil-omega-3 fatty acids 1000 MG capsule, Take 2 g by mouth daily., Disp: , Rfl: ;  Magnesium 300 MG CAPS, Take by mouth daily.  , Disp: , Rfl: ;  Multiple Vitamin (MULTIVITAMIN) capsule, Take 1 capsule by mouth daily.  , Disp: , Rfl: ;  Probiotic Product (ALIGN) 4 MG CAPS, Take 4 capsules by mouth daily., Disp: , Rfl:  valACYclovir (VALTREX) 500 MG tablet, Take 1 tablet (500 mg total) by mouth daily as needed (PT REQUEST NAME  BRAND-NO GENERICS)., Disp: 30 tablet, Rfl: 2  BP 133/79  Pulse 57  Resp 18  Ht 5\' 8"  (1.727 m)  Wt 143 lb (64.864 kg)  BMI 21.74 kg/m2  Body mass index is 21.74 kg/(m^2).             Review of Systems denies chest pain, dyspnea on exertion, PND, orthopnea, hemoptysis. 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     Objective:   Physical Exam blood pressure 133/79 heart rate 57 respirations 18 Gen.-alert and oriented x3 in no apparent distress HEENT normal for age Lungs no rhonchi or wheezing Cardiovascular regular rhythm no murmurs carotid pulses 3+ palpable no bruits audible Abdomen soft nontender no palpable masses Musculoskeletal free of  major deformities Skin clear -no rashes Neurologic normal Lower extremities 3+ femoral and dorsalis pedis pulses palpable bilaterally with no edema she has diffuse spider veins in both lower extremities in the anterior medial and lateral thighs and in the medial and lateral calf areas. There is no distal edema. There is no hyperpigmentation. There are no bulging varicosities. Today I ordered a venous duplex exam which I reviewed and interpreted. Both great saphenous veins have reflux birth small caliber below the saphenofemoral junction. There is no reflux in the deep system.        Assessment:  Very mild venous insufficiency with reflux great saphenous veins are small caliber with diffuse spider veins    treatment would entail sclerotherapy if she should decide she would like to proceed with this Plan:     Patient will consider sclerotherapy for spider veins

## 2012-03-15 DIAGNOSIS — N952 Postmenopausal atrophic vaginitis: Secondary | ICD-10-CM | POA: Diagnosis not present

## 2012-03-15 DIAGNOSIS — M810 Age-related osteoporosis without current pathological fracture: Secondary | ICD-10-CM | POA: Diagnosis not present

## 2012-03-16 NOTE — Procedures (Unsigned)
LOWER EXTREMITY VENOUS REFLUX EXAM  INDICATION:  Bilateral varicose veins with leg pain.  EXAM:  Using color-flow imaging and pulse Doppler spectral analysis, the right and left common femoral, femoral, popliteal, posterior tibial, great and small saphenous veins are evaluated.  There is evidence suggesting deep venous insufficiency in the right and left lower extremity at the level of the common femoral vein.  The right and left saphenofemoral junction is not competent with Reflux of >587milliseconds.  The right and left great saphenous vein is not competent with Reflux of >510milliseconds with the caliber as described below.   The right and left proximal small saphenous vein demonstrates competency.  GSV Diameter (used if found to be incompetent only)                                           Right    Left Proximal Greater Saphenous Vein           0.65 cm  0.62 cm Proximal-to-mid-thigh                     0.34 cm  0.35 cm Mid thigh                                 0.37 cm  0.36 cm Mid-distal thigh                          cm       cm Distal thigh                              0.22 cm  0.32 cm Knee                                      0.30 cm  0.30 cm  IMPRESSION: 1. The right and left great saphenous vein is not competent with     Reflux of >514milliseconds. 2. The right and left great saphenous vein is not tortuous. 3. The deep venous system is not competent with Reflux of     >51milliseconds at the level of the common femoral vein. 4. The right and left small saphenous vein is competent.  ___________________________________________ Quita Skye. Hart Rochester, M.D.  EM/MEDQ  D:  03/10/2012  T:  03/10/2012  Job:  [id number]

## 2012-05-26 DIAGNOSIS — M810 Age-related osteoporosis without current pathological fracture: Secondary | ICD-10-CM | POA: Diagnosis not present

## 2012-05-31 ENCOUNTER — Ambulatory Visit (INDEPENDENT_AMBULATORY_CARE_PROVIDER_SITE_OTHER): Payer: Medicare Other | Admitting: Internal Medicine

## 2012-05-31 ENCOUNTER — Encounter: Payer: Self-pay | Admitting: Internal Medicine

## 2012-05-31 VITALS — BP 110/70 | HR 72 | Temp 98.6°F | Resp 16 | Ht 68.0 in | Wt 142.0 lb

## 2012-05-31 DIAGNOSIS — Z0000 Encounter for general adult medical examination without abnormal findings: Secondary | ICD-10-CM | POA: Diagnosis not present

## 2012-05-31 DIAGNOSIS — T887XXA Unspecified adverse effect of drug or medicament, initial encounter: Secondary | ICD-10-CM

## 2012-05-31 DIAGNOSIS — M545 Low back pain, unspecified: Secondary | ICD-10-CM

## 2012-05-31 DIAGNOSIS — F411 Generalized anxiety disorder: Secondary | ICD-10-CM | POA: Diagnosis not present

## 2012-05-31 DIAGNOSIS — E785 Hyperlipidemia, unspecified: Secondary | ICD-10-CM | POA: Diagnosis not present

## 2012-05-31 DIAGNOSIS — J984 Other disorders of lung: Secondary | ICD-10-CM

## 2012-05-31 DIAGNOSIS — M810 Age-related osteoporosis without current pathological fracture: Secondary | ICD-10-CM | POA: Diagnosis not present

## 2012-05-31 DIAGNOSIS — R5383 Other fatigue: Secondary | ICD-10-CM

## 2012-05-31 LAB — BASIC METABOLIC PANEL
CO2: 28 mEq/L (ref 19–32)
Calcium: 9.7 mg/dL (ref 8.4–10.5)
Chloride: 102 mEq/L (ref 96–112)
Glucose, Bld: 90 mg/dL (ref 70–99)
Potassium: 4.5 mEq/L (ref 3.5–5.1)
Sodium: 137 mEq/L (ref 135–145)

## 2012-05-31 LAB — LIPID PANEL: Total CHOL/HDL Ratio: 2

## 2012-05-31 LAB — HEPATIC FUNCTION PANEL
ALT: 17 U/L (ref 0–35)
AST: 19 U/L (ref 0–37)
Alkaline Phosphatase: 51 U/L (ref 39–117)
Bilirubin, Direct: 0.1 mg/dL (ref 0.0–0.3)
Total Bilirubin: 0.5 mg/dL (ref 0.3–1.2)
Total Protein: 7.4 g/dL (ref 6.0–8.3)

## 2012-05-31 LAB — CBC WITH DIFFERENTIAL/PLATELET
Basophils Relative: 0.7 % (ref 0.0–3.0)
Eosinophils Relative: 2.3 % (ref 0.0–5.0)
HCT: 42.7 % (ref 36.0–46.0)
Hemoglobin: 14 g/dL (ref 12.0–15.0)
MCHC: 32.8 g/dL (ref 30.0–36.0)
MCV: 87.2 fl (ref 78.0–100.0)
Monocytes Absolute: 0.5 10*3/uL (ref 0.1–1.0)
Neutro Abs: 2.7 10*3/uL (ref 1.4–7.7)
Neutrophils Relative %: 52.5 % (ref 43.0–77.0)
RBC: 4.89 Mil/uL (ref 3.87–5.11)
WBC: 5.1 10*3/uL (ref 4.5–10.5)

## 2012-05-31 LAB — POCT URINALYSIS DIPSTICK
Ketones, UA: NEGATIVE
Protein, UA: NEGATIVE
Spec Grav, UA: 1.015

## 2012-05-31 LAB — LDL CHOLESTEROL, DIRECT: Direct LDL: 110.2 mg/dL

## 2012-05-31 NOTE — Progress Notes (Signed)
Subjective:    Patient ID: Cindy Schwartz, female    DOB: 23-Nov-1933, 78 y.o.   MRN: [id number]  HPI   Patient is 78 year old female who presents for Medicare wellness examination she is followed by our office also for chronic medical problems including history of osteoporosis with a compression fracture of her back a history of GERD on Nexium and history of a thoracic aortic aneurysm followed by Betti Cruz. She is chronic recurrent herpes. She is mild hyperlipidemia  Review of Systems  Constitutional: Negative for activity change, appetite change and fatigue.  HENT: Negative for ear pain, congestion, neck pain, postnasal drip and sinus pressure.   Eyes: Negative for redness and visual disturbance.  Respiratory: Negative for cough, shortness of breath and wheezing.   Gastrointestinal: Negative for abdominal pain and abdominal distention.  Genitourinary: Negative for dysuria, frequency and menstrual problem.  Musculoskeletal: Negative for myalgias, joint swelling and arthralgias.  Skin: Negative for rash and wound.  Neurological: Negative for dizziness, weakness and headaches.  Hematological: Negative for adenopathy. Does not bruise/bleed easily.  Psychiatric/Behavioral: Negative for disturbed wake/sleep cycle and decreased concentration.       Objective:   Physical Exam  Nursing note and vitals reviewed. Constitutional: She is oriented to person, place, and time. She appears well-developed and well-nourished. No distress.  HENT:  Head: Normocephalic and atraumatic.  Right Ear: External ear normal.  Left Ear: External ear normal.  Nose: Nose normal.  Mouth/Throat: Oropharynx is clear and moist.  Eyes: Conjunctivae and EOM are normal. Pupils are equal, round, and reactive to light.  Neck: Normal range of motion. Neck supple. No JVD present. No tracheal deviation present. No thyromegaly present.  Cardiovascular: Normal rate, regular rhythm, normal heart sounds and intact distal pulses.    No murmur heard. Pulmonary/Chest: Effort normal and breath sounds normal. She has no wheezes. She exhibits no tenderness.  Abdominal: Soft. Bowel sounds are normal.  Musculoskeletal: Normal range of motion. She exhibits no edema and no tenderness.  Lymphadenopathy:    She has no cervical adenopathy.  Neurological: She is alert and oriented to person, place, and time. She has normal reflexes. No cranial nerve deficit.  Skin: Skin is warm and dry. She is not diaphoretic.  Psychiatric: She has a normal mood and affect. Her behavior is normal.          Assessment & Plan:  Review of management of aortic aneurysm recently seen by her thoracic surgeon.  Aneurysm appears to be stable. followed by thoracic surgery MRI reviewed hemangiomas of  vertebrae.  Stable pulmonary nodule.  Stable GERD.  Mild to moderate persistent anxiety with periodic flare Moderate seasonal allergies Subjective:    Cindy Schwartz is a 78 y.o. female who presents for Medicare Annual/Subsequent preventive examination.  Preventive Screening-Counseling & Management  Tobacco History  Smoking status  . Never Smoker   Smokeless tobacco  . Never Used     Problems Prior to Visit 1.   Current Problems (verified) Patient Active Problem List  Diagnosis  . HERPES SIMPLEX INFECTION  . GENERALIZED ANXIETY DISORDER  . ANEURYSM, THORACIC AORTIC  . RAYNAUD'S SYNDROME  . ALLERGIC RHINITIS, CHRONIC  . LUNG NODULE  . Esophageal Reflux  . IRRITABLE BOWEL SYNDROME  . OSTEOARTHROS UNSPEC WHETHER GEN/LOC SHLDR REGION  . OSTEOARTHROSIS UNSPEC WHETHER GEN/LOC LOWER LEG  . DEGENERATIVE DISC DISEASE, CERVICAL SPINE  . NECK AND BACK PAIN  . BACK PAIN, LUMBAR  . OSTEOPOROSIS  . COMPRESSION FRACTURE, THORACIC VERTEBRA  . Hx of  herpes simplex infection  . Varicose veins of lower extremities with other complications    Medications Prior to Visit Current Outpatient Prescriptions on File Prior to Visit  Medication Sig  Dispense Refill  . calcium-vitamin D 250-100 MG-UNIT per tablet Take 1 tablet by mouth daily.        . cetirizine (ZYRTEC) 10 MG tablet Take 10 mg by mouth daily as needed.       . Cholecalciferol (VITAMIN D) 1000 UNITS capsule Take 1,000 Units by mouth daily.        Marland Kitchen esomeprazole (NEXIUM) 40 MG capsule Take 1 capsule (40 mg total) by mouth daily before breakfast.  90 capsule  3  . fish oil-omega-3 fatty acids 1000 MG capsule Take 2 g by mouth daily.      . Magnesium 300 MG CAPS Take by mouth daily.        . Multiple Vitamin (MULTIVITAMIN) capsule Take 1 capsule by mouth daily.        . valACYclovir (VALTREX) 500 MG tablet Take 1 tablet (500 mg total) by mouth daily as needed (PT REQUEST NAME BRAND-NO GENERICS).  30 tablet  2    Current Medications (verified) Current Outpatient Prescriptions  Medication Sig Dispense Refill  . calcium-vitamin D 250-100 MG-UNIT per tablet Take 1 tablet by mouth daily.        . cetirizine (ZYRTEC) 10 MG tablet Take 10 mg by mouth daily as needed.       . Cholecalciferol (VITAMIN D) 1000 UNITS capsule Take 1,000 Units by mouth daily.        Marland Kitchen esomeprazole (NEXIUM) 40 MG capsule Take 1 capsule (40 mg total) by mouth daily before breakfast.  90 capsule  3  . fish oil-omega-3 fatty acids 1000 MG capsule Take 2 g by mouth daily.      . Magnesium 300 MG CAPS Take by mouth daily.        . Multiple Vitamin (MULTIVITAMIN) capsule Take 1 capsule by mouth daily.        . valACYclovir (VALTREX) 500 MG tablet Take 1 tablet (500 mg total) by mouth daily as needed (PT REQUEST NAME BRAND-NO GENERICS).  30 tablet  2     Allergies (verified) Review of patient's allergies indicates no known allergies.   PAST HISTORY  Family History No family history on file.  Social History History  Substance Use Topics  . Smoking status: Never Smoker   . Smokeless tobacco: Never Used  . Alcohol Use: No     Are there smokers in your home (other than you)? No  Risk  Factors Current exercise habits: The patient does not participate in regular exercise at present.  Dietary issues discussed: none   Cardiac risk factors: dyslipidemia and sedentary lifestyle.  Depression Screen (Note: if answer to either of the following is "Yes", a more complete depression screening is indicated)   Over the past two weeks, have you felt down, depressed or hopeless? Yes  Over the past two weeks, have you felt little interest or pleasure in doing things? No  Have you lost interest or pleasure in daily life? No  Do you often feel hopeless? No  Do you cry easily over simple problems? No  Activities of Daily Living In your present state of health, do you have any difficulty performing the following activities?:  Driving? No Managing money?  No Feeding yourself? No Getting from bed to chair? No Climbing a flight of stairs? No Preparing food and eating?: No Bathing  or showering? No Getting dressed: No Getting to the toilet? No Using the toilet:No Moving around from place to place: No In the past year have you fallen or had a near fall?:No   Are you sexually active?  Yes  Do you have more than one partner?  No  Hearing Difficulties: No Do you often ask people to speak up or repeat themselves? No Do you experience ringing or noises in your ears? No Do you have difficulty understanding soft or whispered voices? No   Do you feel that you have a problem with memory? No  Do you often misplace items? No  Do you feel safe at home?  No  Cognitive Testing  Alert? Yes  Normal Appearance?Yes  Oriented to person? Yes  Place? Yes   Time? Yes  Recall of three objects?  Yes  Can perform simple calculations? Yes  Displays appropriate judgment?Yes  Can read the correct time from a watch face?Yes   Advanced Directives have been discussed with the patient? Yes  List the Names of Other Physician/Practitioners you currently use: 1.    Indicate any recent Medical Services  you may have received from other than [facility] providers in the past year (date may be approximate).  Immunization History  Administered Date(s) Administered  . Influenza Split 06/26/2011  . Influenza Whole 07/09/1999, 07/01/2007, 06/27/2008, 07/11/2009, 06/28/2010  . Td 09/23/2003    Screening Tests Health Maintenance  Topic Date Due  . Zostavax  12/16/1995  . Pneumococcal Polysaccharide Vaccine Age 27 And Over  12/15/2000  . Colonoscopy  06/22/2011  . Influenza Vaccine  06/22/2012  . Tetanus/tdap  09/22/2013    All answers were reviewed with the patient and necessary referrals were made:  Carrie Mew, MD   05/31/2012   History reviewed: allergies, current medications, past family history, past medical history, past social history, past surgical history and problem list  Review of Systems A comprehensive review of systems was negative.    Objective:     Vision by Snellen chart: right eye:20/20, left eye:20/20 corrected  Body mass index is 21.59 kg/(m^2). BP 110/70  Pulse 72  Temp 98.6 F (37 C)  Resp 16  Ht 5\' 8"  (1.727 m)  Wt 142 lb (64.411 kg)  BMI 21.59 kg/m2  BP 110/70  Pulse 72  Temp 98.6 F (37 C)  Resp 16  Ht 5\' 8"  (1.727 m)  Wt 142 lb (64.411 kg)  BMI 21.59 kg/m2  General Appearance:    Alert, cooperative, no distress, appears stated age  Head:    Normocephalic, without obvious abnormality, atraumatic  Eyes:    PERRL, conjunctiva/corneas clear, EOM's intact, fundi    benign, both eyes  Ears:    Normal TM's and external ear canals, both ears  Nose:   Nares normal, septum midline, mucosa normal, no drainage    or sinus tenderness  Throat:   Lips, mucosa, and tongue normal; teeth and gums normal  Neck:   Supple, symmetrical, trachea midline, no adenopathy;    thyroid:  no enlargement/tenderness/nodules; no carotid   bruit or JVD  Back:     Symmetric, no curvature, ROM normal, no CVA tenderness  Lungs:     Clear to auscultation bilaterally, respirations  unlabored  Chest Wall:    No tenderness or deformity   Heart:    Regular rate and rhythm, S1 and S2 normal, no murmur, rub   or gallop  Breast Exam:    No tenderness, masses, or nipple abnormality  Abdomen:  Soft, non-tender, bowel sounds active all four quadrants,    no masses, no organomegaly  Genitalia:    Normal female without lesion, discharge or tenderness  Rectal:    Normal tone, normal prostate, no masses or tenderness;   guaiac negative stool  Extremities:   Extremities normal, atraumatic, no cyanosis or edema  Pulses:   2+ and symmetric all extremities  Skin:   Skin color, texture, turgor normal, no rashes or lesions  Lymph nodes:   Cervical, supraclavicular, and axillary nodes normal  Neurologic:   CNII-XII intact, normal strength, sensation and reflexes    throughout       Assessment:     This is a routine physical examination for this healthy  Female. Reviewed all health maintenance protocols including mammography colonoscopy bone density and reviewed appropriate screening labs. Her immunization history was reviewed as well as her current medications and allergies refills of her chronic medications were given and the plan for yearly health maintenance was discussed all orders and referrals were made as appropriate.      Plan:     During the course of the visit the patient was educated and counseled about appropriate screening and preventive services including:    Influenza vaccine  Screening mammography  Bone densitometry screening  Diet review for nutrition referral? Yes ____  Not Indicated ____x   Patient Instructions (the written plan) was given to the patient.  Medicare Attestation I have personally reviewed: The patient's medical and social history Their use of alcohol, tobacco or illicit drugs Their current medications and supplements The patient's functional ability including ADLs,fall risks, home safety risks, cognitive, and hearing and visual  impairment Diet and physical activities Evidence for depression or mood disorders  The patient's weight, height, BMI, and visual acuity have been recorded in the chart.  I have made referrals, counseling, and provided education to the patient based on review of the above and I have provided the patient with a written personalized care plan for preventive services.     Carrie Mew, MD   05/31/2012

## 2012-05-31 NOTE — Patient Instructions (Addendum)
The patient is instructed to continue all medications as prescribed. Schedule followup with check out clerk upon leaving the clinic For the allergies use the zetonia daily For the hair loss try the NIOXIN shampoo ( find it at [facility]) Try ginsing for memory

## 2012-06-01 LAB — VITAMIN D 25 HYDROXY (VIT D DEFICIENCY, FRACTURES): Vit D, 25-Hydroxy: 36 ng/mL (ref 30–89)

## 2012-06-18 DIAGNOSIS — H2640 Unspecified secondary cataract: Secondary | ICD-10-CM | POA: Diagnosis not present

## 2012-06-18 DIAGNOSIS — Z961 Presence of intraocular lens: Secondary | ICD-10-CM | POA: Diagnosis not present

## 2012-06-18 DIAGNOSIS — H524 Presbyopia: Secondary | ICD-10-CM | POA: Diagnosis not present

## 2012-06-25 ENCOUNTER — Other Ambulatory Visit: Payer: Self-pay | Admitting: *Deleted

## 2012-06-25 MED ORDER — VALACYCLOVIR HCL 500 MG PO TABS: 500.0000 mg | ORAL_TABLET | Freq: Every day | ORAL | Status: DC | PRN

## 2012-07-07 ENCOUNTER — Ambulatory Visit (INDEPENDENT_AMBULATORY_CARE_PROVIDER_SITE_OTHER): Payer: Medicare Other

## 2012-07-07 DIAGNOSIS — Z23 Encounter for immunization: Secondary | ICD-10-CM | POA: Diagnosis not present

## 2012-07-14 DIAGNOSIS — J0100 Acute maxillary sinusitis, unspecified: Secondary | ICD-10-CM | POA: Diagnosis not present

## 2012-07-15 ENCOUNTER — Other Ambulatory Visit: Payer: Self-pay | Admitting: Dermatology

## 2012-07-15 DIAGNOSIS — L821 Other seborrheic keratosis: Secondary | ICD-10-CM | POA: Diagnosis not present

## 2012-07-15 DIAGNOSIS — D485 Neoplasm of uncertain behavior of skin: Secondary | ICD-10-CM | POA: Diagnosis not present

## 2012-07-15 DIAGNOSIS — D239 Other benign neoplasm of skin, unspecified: Secondary | ICD-10-CM | POA: Diagnosis not present

## 2012-07-15 DIAGNOSIS — Z411 Encounter for cosmetic surgery: Secondary | ICD-10-CM | POA: Diagnosis not present

## 2012-07-15 DIAGNOSIS — D234 Other benign neoplasm of skin of scalp and neck: Secondary | ICD-10-CM | POA: Diagnosis not present

## 2012-07-26 DIAGNOSIS — H905 Unspecified sensorineural hearing loss: Secondary | ICD-10-CM | POA: Diagnosis not present

## 2012-07-26 DIAGNOSIS — H9319 Tinnitus, unspecified ear: Secondary | ICD-10-CM | POA: Diagnosis not present

## 2012-07-26 DIAGNOSIS — J0100 Acute maxillary sinusitis, unspecified: Secondary | ICD-10-CM | POA: Diagnosis not present

## 2012-08-23 DIAGNOSIS — J329 Chronic sinusitis, unspecified: Secondary | ICD-10-CM | POA: Diagnosis not present

## 2012-08-24 ENCOUNTER — Ambulatory Visit (INDEPENDENT_AMBULATORY_CARE_PROVIDER_SITE_OTHER): Payer: Medicare Other | Admitting: Internal Medicine

## 2012-08-24 ENCOUNTER — Encounter: Payer: Self-pay | Admitting: Internal Medicine

## 2012-08-24 ENCOUNTER — Encounter: Payer: Self-pay | Admitting: *Deleted

## 2012-08-24 VITALS — BP 120/70 | HR 72 | Temp 98.0°F | Resp 16 | Ht 68.0 in | Wt 146.0 lb

## 2012-08-24 DIAGNOSIS — R1031 Right lower quadrant pain: Secondary | ICD-10-CM

## 2012-08-24 DIAGNOSIS — R52 Pain, unspecified: Secondary | ICD-10-CM

## 2012-08-24 DIAGNOSIS — R143 Flatulence: Secondary | ICD-10-CM | POA: Diagnosis not present

## 2012-08-24 DIAGNOSIS — R140 Abdominal distension (gaseous): Secondary | ICD-10-CM

## 2012-08-24 DIAGNOSIS — R1032 Left lower quadrant pain: Secondary | ICD-10-CM

## 2012-08-24 DIAGNOSIS — R141 Gas pain: Secondary | ICD-10-CM | POA: Diagnosis not present

## 2012-08-24 NOTE — Patient Instructions (Addendum)
Gluten free diet Get the shingles vaccine at pharmacy

## 2012-08-24 NOTE — Progress Notes (Signed)
Subjective:    Patient ID: Cindy Schwartz, female    DOB: 1933/10/22, 78 y.o.   MRN: [id number]  HPI CHRONIC ABDOMINAL PAIN\ Increased anxiety and grief are contributors Diffuse No diarrhea   Review of Systems  Constitutional: Negative for activity change, appetite change and fatigue.  HENT: Negative for ear pain, congestion, neck pain, postnasal drip and sinus pressure.   Eyes: Negative for redness and visual disturbance.  Respiratory: Negative for cough, shortness of breath and wheezing.   Gastrointestinal: Negative for abdominal pain and abdominal distention.  Genitourinary: Negative for dysuria, frequency and menstrual problem.  Musculoskeletal: Negative for myalgias, joint swelling and arthralgias.  Skin: Negative for rash and wound.  Neurological: Negative for dizziness, weakness and headaches.  Hematological: Negative for adenopathy. Does not bruise/bleed easily.  Psychiatric/Behavioral: Negative for sleep disturbance and decreased concentration.   Past Medical History  Diagnosis Date  . Osteoporosis   . Aneurysm of ascending aorta   . History of ovarian cancer   . Raynaud's disease   . GERD (gastroesophageal reflux disease)   . Insomnia     History   Social History  . Marital Status: Married    Spouse Name: N/A    Number of Children: N/A  . Years of Education: N/A   Occupational History  . retired    Social History Main Topics  . Smoking status: Never Smoker   . Smokeless tobacco: Never Used  . Alcohol Use: No  . Drug Use: No  . Sexually Active: Not on file   Other Topics Concern  . Not on file   Social History Narrative  . No narrative on file    Past Surgical History  Procedure Date  . Ovarian ca s/p taj/bso 1979  . Abdominal hysterectomy   . Appendectomy   . Bilateral salpingoophorectomy     No family history on file.  No Known Allergies  Current Outpatient Prescriptions on File Prior to Visit  Medication Sig Dispense Refill  .  calcium-vitamin D 250-100 MG-UNIT per tablet Take 1 tablet by mouth daily.        . cetirizine (ZYRTEC) 10 MG tablet Take 10 mg by mouth daily as needed.       . Cholecalciferol (VITAMIN D) 1000 UNITS capsule Take 1,000 Units by mouth daily.        Marland Kitchen esomeprazole (NEXIUM) 40 MG capsule Take 1 capsule (40 mg total) by mouth daily before breakfast.  90 capsule  3  . fish oil-omega-3 fatty acids 1000 MG capsule Take 2 g by mouth daily.      . Magnesium 300 MG CAPS Take by mouth daily.        . Multiple Vitamin (MULTIVITAMIN) capsule Take 1 capsule by mouth daily.        . valACYclovir (VALTREX) 500 MG tablet Take 1 tablet (500 mg total) by mouth daily as needed (PT REQUEST NAME BRAND-NO GENERICS).  30 tablet  2    BP 120/70  Pulse 72  Temp 98 F (36.7 C)  Resp 16  Ht 5\' 8"  (1.727 m)  Wt 146 lb (66.225 kg)  BMI 22.20 kg/m2       Objective:   Physical Exam  Nursing note and vitals reviewed. Constitutional: She is oriented to person, place, and time. She appears well-developed and well-nourished. No distress.  HENT:  Head: Normocephalic and atraumatic.  Right Ear: External ear normal.  Left Ear: External ear normal.  Nose: Nose normal.  Mouth/Throat: Oropharynx is clear and moist.  Eyes: Conjunctivae normal and EOM are normal. Pupils are equal, round, and reactive to light.  Neck: Normal range of motion. Neck supple. No JVD present. No tracheal deviation present. No thyromegaly present.  Cardiovascular: Normal rate, regular rhythm, normal heart sounds and intact distal pulses.   No murmur heard. Pulmonary/Chest: Effort normal and breath sounds normal. She has no wheezes. She exhibits no tenderness.  Abdominal: Soft. Bowel sounds are normal.  Musculoskeletal: Normal range of motion. She exhibits no edema and no tenderness.  Lymphadenopathy:    She has no cervical adenopathy.  Neurological: She is alert and oriented to person, place, and time. She has normal reflexes. No cranial  nerve deficit.  Skin: Skin is warm and dry. She is not diaphoretic.  Psychiatric: She has a normal mood and affect. Her behavior is normal.          Assessment & Plan:  Discussed abdominal pain

## 2012-08-25 ENCOUNTER — Ambulatory Visit (INDEPENDENT_AMBULATORY_CARE_PROVIDER_SITE_OTHER): Payer: Medicare Other | Admitting: *Deleted

## 2012-08-25 DIAGNOSIS — I781 Nevus, non-neoplastic: Secondary | ICD-10-CM

## 2012-08-25 NOTE — Progress Notes (Signed)
X=.3% Sotradecol administered with a 27g butterfly.  Patient received a total of 12cc.  Treated as many vessels as 2 syringes would allow. Tol very well. Hoping for good results. Her stockings were loose but she hates them so didn't insist she buy new ones. Hopefully she will be compliant with hers. Easy access. Follow prn.  Photos: yes  Compression stockings applied: yes

## 2012-08-31 ENCOUNTER — Other Ambulatory Visit: Payer: Self-pay | Admitting: Internal Medicine

## 2012-09-09 ENCOUNTER — Telehealth: Payer: Self-pay | Admitting: Internal Medicine

## 2012-09-09 NOTE — Telephone Encounter (Signed)
Pt informed she was negative

## 2012-09-09 NOTE — Telephone Encounter (Signed)
Patient called stating that she would like a call back with celiac test results. Please assist.

## 2012-10-18 ENCOUNTER — Ambulatory Visit: Payer: Medicare Other | Admitting: Internal Medicine

## 2012-10-26 ENCOUNTER — Encounter: Payer: Self-pay | Admitting: *Deleted

## 2012-10-27 ENCOUNTER — Ambulatory Visit (INDEPENDENT_AMBULATORY_CARE_PROVIDER_SITE_OTHER): Payer: Medicare Other | Admitting: *Deleted

## 2012-10-27 DIAGNOSIS — I781 Nevus, non-neoplastic: Secondary | ICD-10-CM

## 2012-10-27 NOTE — Progress Notes (Signed)
X=.3% Sotradecol administered with a 27g butterfly.  Patient received a total of 6cc.  Pt stated she wasn't happy with last treatment because not all of the veins went away. We compared photos with today and actually she had a good result. She has many spiders so I went over how multiple visits sometimes are necessary to get 100%. She understood so we proceeded with one syringe. Easy access. Anticipate good results. I did make her buy a new pair of stockings which were tighter than what she brought in. Will follow prn.  Photos: yes  Compression stockings applied: yes

## 2012-11-01 ENCOUNTER — Encounter: Payer: Self-pay | Admitting: Vascular Surgery

## 2012-11-01 ENCOUNTER — Other Ambulatory Visit: Payer: Self-pay | Admitting: *Deleted

## 2012-11-01 DIAGNOSIS — I712 Thoracic aortic aneurysm, without rupture: Secondary | ICD-10-CM

## 2012-11-02 ENCOUNTER — Other Ambulatory Visit: Payer: Self-pay | Admitting: Obstetrics and Gynecology

## 2012-11-02 DIAGNOSIS — Z1231 Encounter for screening mammogram for malignant neoplasm of breast: Secondary | ICD-10-CM

## 2012-11-03 ENCOUNTER — Other Ambulatory Visit: Payer: Self-pay | Admitting: *Deleted

## 2012-11-03 DIAGNOSIS — I712 Thoracic aortic aneurysm, without rupture: Secondary | ICD-10-CM

## 2012-11-11 DIAGNOSIS — J310 Chronic rhinitis: Secondary | ICD-10-CM | POA: Diagnosis not present

## 2012-11-11 DIAGNOSIS — K146 Glossodynia: Secondary | ICD-10-CM | POA: Diagnosis not present

## 2012-11-11 DIAGNOSIS — K219 Gastro-esophageal reflux disease without esophagitis: Secondary | ICD-10-CM | POA: Diagnosis not present

## 2012-11-26 ENCOUNTER — Ambulatory Visit: Payer: Medicare Other

## 2012-11-30 ENCOUNTER — Ambulatory Visit
Admission: RE | Admit: 2012-11-30 | Discharge: 2012-11-30 | Disposition: A | Discharge status: Home / Self Care | Payer: Medicare Other | Source: Ambulatory Visit | Attending: Surgery | Admitting: Surgery

## 2012-11-30 ENCOUNTER — Ambulatory Visit (INDEPENDENT_AMBULATORY_CARE_PROVIDER_SITE_OTHER): Payer: Medicare Other | Admitting: Surgery

## 2012-11-30 ENCOUNTER — Encounter: Payer: Self-pay | Admitting: Surgery

## 2012-11-30 VITALS — BP 127/77 | HR 75 | Resp 20 | Ht 68.0 in | Wt 146.0 lb

## 2012-11-30 DIAGNOSIS — I712 Thoracic aortic aneurysm, without rupture: Secondary | ICD-10-CM | POA: Diagnosis not present

## 2012-11-30 MED ORDER — GADOBENATE DIMEGLUMINE 529 MG/ML IV SOLN
13.0000 mL | Freq: Once | INTRAVENOUS | Status: AC | PRN
  Administered 2012-11-30: 13 mL via INTRAVENOUS

## 2012-11-30 NOTE — Progress Notes (Signed)
[address]       Cindy Schwartz [id number]             [phone]      HPI:  The patient returns today for followup of an ascending aortic aneurysm. I last saw her on 12/10/2010 at which time an MR angiogram of the chest showed a stable fusiform ascending aortic aneurysm of 4.2 cm at the level of the right pulmonary artery. Since I last saw her she said that she has been feeling fairly well. She denies any chest or back pain.  Current Outpatient Prescriptions  Medication Sig Dispense Refill  . calcium-vitamin D 250-100 MG-UNIT per tablet Take 1 tablet by mouth daily.        . cetirizine (ZYRTEC) 10 MG tablet Take 10 mg by mouth daily as needed.       . Cholecalciferol (VITAMIN D) 1000 UNITS capsule Take 1,000 Units by mouth daily.        . fish oil-omega-3 fatty acids 1000 MG capsule Take 2 g by mouth daily.      . Magnesium 300 MG CAPS Take by mouth daily.        . Multiple Vitamin (MULTIVITAMIN) capsule Take 1 capsule by mouth daily.        Marland Kitchen NEXIUM 40 MG capsule TAKE 1 CAPSULE DAILY BEFORE BREAKFAST  90 capsule  2  . valACYclovir (VALTREX) 500 MG tablet Take 1 tablet (500 mg total) by mouth daily as needed (PT REQUEST NAME BRAND-NO GENERICS).  30 tablet  2   No current facility-administered medications for this visit.     Physical Exam: BP 127/77  Pulse 75  Resp 20  Ht 5\' 8"  (1.727 m)  Wt 146 lb (66.225 kg)  BMI 22.2 kg/m2  SpO2 98% She looks well. Cardiac exam shows a regular rate and rhythm with normal S1 and S2. There is no murmur, rub, or gallop. Lung exam is clear.   Diagnostic Tests:  *RADIOLOGY REPORT*   Clinical Data: Follow up thoracic aortic aneurysm   MRA CHEST WITH OR WITHOUT CONTRAST   Technique: Angiographic images of the chest were obtained using MRA technique without and with intravenous contrast.   Contrast: 13mL MULTIHANCE GADOBENATE DIMEGLUMINE 529 MG/ML IV SOLN   BUN and creatinine were obtained on site at [facility]  at [address] Results:  BUN 13 mg/dL,  Creatinine 0.8 mg/dL, GFR 70.   Comparison: Prior MR angiogram of the chest 12/10/2010; MRI of the thoracic spine 12/13/2010   Findings: Stable appearance of the thoracic aorta with aneurysmal dilatation of the ascending segment to 4.2-4.3 cm which is unchanged compared to 12/10/2010.  The transverse aorta measures approximately 3 cm in diameter.  The proximal descending thoracic aorta measures 2.5 cm in diameter.   No evidence of acute dissection.  Conventional three-vessel aortic arch anatomy.   There is mild cardiomegaly with right atrial enlargement.  No pericardial effusion.  The visualized portions of the thoracic esophagus are unremarkable.  No focal lung abnormality.   Multi focal vertebral body signal abnormalities previously characterized as hemangiomas remain unchanged in size, number and configuration.   IMPRESSION:   1.  Stable aneurysmal dilatation of the ascending thoracic aorta to 4.2 - 4.3 cm which is not significantly enlarged compared to 12/10/2010.   2.  Mild cardiomegaly with right atrial enlargement.   3.  Stable vertebral body hemangiomas.   Signed,   Sterling Big, MD Vascular & Interventional Radiologist  [facility]     Original Report Authenticated By: Malachy Moan, M.D.     Impression:  She has a stable 4.2-4.3 cm fusiform ascending aortic aneurysm. This has been stable in size since around 2005. I recommended that we repeat her MR angiogram in 2 years. I would not recommend surgical therapy unless she has rapid growth of 1 cm or more per year or if the aneurysm increases to 5.5 cm. She is not on a beta blocker but probably should be with her aortic aneurysm. She has a followup appointment with her primary physician Dr. Lovell Sheehan in the near future and I will let him decide about starting that.  Plan:  She will return to see me in 2 years with an MR angiogram of the  chest.

## 2012-12-20 ENCOUNTER — Telehealth: Payer: Self-pay | Admitting: Internal Medicine

## 2012-12-20 ENCOUNTER — Ambulatory Visit (INDEPENDENT_AMBULATORY_CARE_PROVIDER_SITE_OTHER): Payer: Medicare Other | Admitting: Internal Medicine

## 2012-12-20 ENCOUNTER — Encounter: Payer: Self-pay | Admitting: Internal Medicine

## 2012-12-20 VITALS — BP 130/80 | HR 72 | Temp 98.3°F | Resp 16 | Ht 68.0 in | Wt 144.0 lb

## 2012-12-20 DIAGNOSIS — L255 Unspecified contact dermatitis due to plants, except food: Secondary | ICD-10-CM

## 2012-12-20 DIAGNOSIS — Z23 Encounter for immunization: Secondary | ICD-10-CM

## 2012-12-20 DIAGNOSIS — J309 Allergic rhinitis, unspecified: Secondary | ICD-10-CM

## 2012-12-20 DIAGNOSIS — K219 Gastro-esophageal reflux disease without esophagitis: Secondary | ICD-10-CM

## 2012-12-20 DIAGNOSIS — E785 Hyperlipidemia, unspecified: Secondary | ICD-10-CM

## 2012-12-20 DIAGNOSIS — L237 Allergic contact dermatitis due to plants, except food: Secondary | ICD-10-CM

## 2012-12-20 DIAGNOSIS — Z20828 Contact with and (suspected) exposure to other viral communicable diseases: Secondary | ICD-10-CM | POA: Diagnosis not present

## 2012-12-20 LAB — LDL CHOLESTEROL, DIRECT: Direct LDL: 127.5 mg/dL

## 2012-12-20 LAB — LIPID PANEL
Cholesterol: 205 mg/dL — ABNORMAL HIGH (ref 0–200)
HDL: 69.9 mg/dL (ref >39.00)
Triglycerides: 42 mg/dL (ref 0.0–149.0)

## 2012-12-20 LAB — CBC WITH DIFFERENTIAL/PLATELET
Basophils Relative: 0.8 % (ref 0.0–3.0)
Eosinophils Absolute: 0.3 10*3/uL (ref 0.0–0.7)
HCT: 39.9 % (ref 36.0–46.0)
Lymphs Abs: 1.7 10*3/uL (ref 0.7–4.0)
MCHC: 33.7 g/dL (ref 30.0–36.0)
MCV: 86.4 fl (ref 78.0–100.0)
Monocytes Absolute: 0.5 10*3/uL (ref 0.1–1.0)
Neutrophils Relative %: 52.4 % (ref 43.0–77.0)
Platelets: 213 10*3/uL (ref 150.0–400.0)

## 2012-12-20 LAB — HEPATIC FUNCTION PANEL
Bilirubin, Direct: 0 mg/dL (ref 0.0–0.3)
Total Bilirubin: 0.6 mg/dL (ref 0.3–1.2)
Total Protein: 7.1 g/dL (ref 6.0–8.3)

## 2012-12-20 MED ORDER — CLOBETASOL PROP EMOLLIENT BASE 0.05 % EX CREA: 1.0000 "application " | TOPICAL_CREAM | Freq: Two times a day (BID) | CUTANEOUS | Status: DC

## 2012-12-20 MED ORDER — AZELASTINE HCL 0.1 % NA SOLN: 1.0000 | Freq: Two times a day (BID) | NASAL | Status: DC

## 2012-12-20 MED ORDER — MOMETASONE FUROATE 50 MCG/ACT NA SUSP: 2.0000 | Freq: Every day | NASAL | Status: DC

## 2012-12-20 MED ORDER — BETAMETHASONE DIPROPIONATE AUG 0.05 % EX LOTN: TOPICAL_LOTION | Freq: Two times a day (BID) | CUTANEOUS | Status: DC

## 2012-12-20 NOTE — Addendum Note (Signed)
Addended by: Willy Eddy on: 12/20/2012 09:28 AM   Modules accepted: Orders

## 2012-12-20 NOTE — Telephone Encounter (Signed)
PT called and stated that Dr. Lovell Sheehan told her that she would receive a cream for her hands, and her pharmacy did not receive any such RX. She stated that it was imperative that she receive this, and would like to hear from you with more details.

## 2012-12-20 NOTE — Patient Instructions (Addendum)
Stop the MVI Take the b complex Take the magnesium Take 500 mg of calcium  ( gluconate) 1000 iu of vitamin d

## 2012-12-20 NOTE — Progress Notes (Signed)
  Subjective:    Patient ID: Cindy Schwartz, female    DOB: 1934-08-15, 79 y.o.   MRN: [id number]  HPI Patient had an MRI of the chest for monitoring of a stenting aortic aneurysm.  It is stable in appearance compared to last year.  The patient presents today with several complaints including tonic abdominal pain related to irritable bowel.  A rash on the left wrist and inflammatory irritation of the tongue with a sensation of  Cuts Saw Dr [facility]. Took oral fungal medications which helped the through but not the tongue.    Review of Systems  Constitutional: Positive for fatigue. Negative for activity change and appetite change.  HENT: Positive for sore throat, mouth sores and sinus pressure. Negative for ear pain, congestion, neck pain and postnasal drip.   Eyes: Negative for redness and visual disturbance.  Respiratory: Negative for cough, shortness of breath and wheezing.   Gastrointestinal: Positive for abdominal pain and abdominal distention.  Genitourinary: Negative for dysuria, frequency and menstrual problem.  Musculoskeletal: Negative for myalgias, joint swelling and arthralgias.  Skin: Negative for rash and wound.  Neurological: Negative for dizziness, weakness and headaches.  Hematological: Negative for adenopathy. Does not bruise/bleed easily.  Psychiatric/Behavioral: Negative for sleep disturbance and decreased concentration.       Objective:   Physical Exam  Nursing note and vitals reviewed. Constitutional: She is oriented to person, place, and time. She appears well-developed and well-nourished. No distress.  HENT:  Head: Normocephalic and atraumatic.  Right Ear: External ear normal.  Left Ear: External ear normal.  Nose: Nose normal.  Mouth/Throat: Oropharynx is clear and moist.  Eyes: Conjunctivae and EOM are normal. Pupils are equal, round, and reactive to light.  Neck: Normal range of motion. Neck supple. No JVD present. No tracheal deviation present. No  thyromegaly present.  Cardiovascular: Normal rate, regular rhythm, normal heart sounds and intact distal pulses.   No murmur heard. Pulmonary/Chest: Effort normal and breath sounds normal. She has no wheezes. She exhibits no tenderness.  Abdominal: Soft. Bowel sounds are normal.  Musculoskeletal: Normal range of motion. She exhibits no edema and no tenderness.  Lymphadenopathy:    She has no cervical adenopathy.  Neurological: She is alert and oriented to person, place, and time. She has normal reflexes. No cranial nerve deficit.  Skin: Skin is warm and dry. She is not diaphoretic.  Psychiatric: She has a normal mood and affect. Her behavior is normal.          Assessment & Plan:  The patient  Was tested for b12 deficiency B1 and B6 supplement discussed I belive the oral symptoms may be related to b12 and the suppliments she takes IBS and chronic gastric pain is exacerbated by these.  Stop MVI B complex, fish oil and calcium and magnesium Recorded she had colonoscopy at [facility]

## 2012-12-21 LAB — VARICELLA ZOSTER ANTIBODY, IGG: Varicella IgG: 1506 Index — ABNORMAL HIGH (ref <135.00)

## 2012-12-29 DIAGNOSIS — R300 Dysuria: Secondary | ICD-10-CM | POA: Diagnosis not present

## 2013-01-05 ENCOUNTER — Ambulatory Visit (INDEPENDENT_AMBULATORY_CARE_PROVIDER_SITE_OTHER): Payer: Medicare Other | Admitting: *Deleted

## 2013-01-05 DIAGNOSIS — I83893 Varicose veins of bilateral lower extremities with other complications: Secondary | ICD-10-CM

## 2013-01-05 DIAGNOSIS — I781 Nevus, non-neoplastic: Secondary | ICD-10-CM

## 2013-01-05 NOTE — Progress Notes (Signed)
Expressed a small amount of old blood from an injected vein. Sold her a pair of Knee highs.

## 2013-01-11 DIAGNOSIS — K5900 Constipation, unspecified: Secondary | ICD-10-CM | POA: Diagnosis not present

## 2013-01-11 DIAGNOSIS — K219 Gastro-esophageal reflux disease without esophagitis: Secondary | ICD-10-CM | POA: Diagnosis not present

## 2013-01-11 DIAGNOSIS — R1084 Generalized abdominal pain: Secondary | ICD-10-CM | POA: Diagnosis not present

## 2013-01-20 ENCOUNTER — Ambulatory Visit
Admission: RE | Admit: 2013-01-20 | Discharge: 2013-01-20 | Disposition: A | Discharge status: Home / Self Care | Payer: Medicare Other | Source: Ambulatory Visit | Attending: Obstetrics and Gynecology | Admitting: Obstetrics and Gynecology

## 2013-01-20 DIAGNOSIS — Z1231 Encounter for screening mammogram for malignant neoplasm of breast: Secondary | ICD-10-CM

## 2013-02-11 DIAGNOSIS — I8310 Varicose veins of unspecified lower extremity with inflammation: Secondary | ICD-10-CM | POA: Diagnosis not present

## 2013-05-19 ENCOUNTER — Ambulatory Visit: Payer: PRIVATE HEALTH INSURANCE | Admitting: Internal Medicine

## 2013-05-20 ENCOUNTER — Encounter: Payer: Self-pay | Admitting: Internal Medicine

## 2013-05-20 ENCOUNTER — Ambulatory Visit (INDEPENDENT_AMBULATORY_CARE_PROVIDER_SITE_OTHER): Payer: Medicare Other | Admitting: Internal Medicine

## 2013-05-20 VITALS — BP 120/70 | HR 66 | Temp 98.2°F | Resp 20 | Wt 145.0 lb

## 2013-05-20 DIAGNOSIS — R55 Syncope and collapse: Secondary | ICD-10-CM | POA: Diagnosis not present

## 2013-05-20 DIAGNOSIS — J309 Allergic rhinitis, unspecified: Secondary | ICD-10-CM | POA: Diagnosis not present

## 2013-05-20 LAB — COMPREHENSIVE METABOLIC PANEL
ALT: 21 U/L (ref 0–35)
Alkaline Phosphatase: 53 U/L (ref 39–117)
CO2: 27 mEq/L (ref 19–32)
Creatinine, Ser: 0.7 mg/dL (ref 0.4–1.2)
GFR: 92.2 mL/min (ref >60.00)
Glucose, Bld: 80 mg/dL (ref 70–99)
Sodium: 134 mEq/L — ABNORMAL LOW (ref 135–145)
Total Bilirubin: 0.7 mg/dL (ref 0.3–1.2)
Total Protein: 7 g/dL (ref 6.0–8.3)

## 2013-05-20 LAB — CBC WITH DIFFERENTIAL/PLATELET
Basophils Relative: 0.7 % (ref 0.0–3.0)
Eosinophils Relative: 3.8 % (ref 0.0–5.0)
HCT: 38.7 % (ref 36.0–46.0)
Hemoglobin: 12.9 g/dL (ref 12.0–15.0)
Lymphs Abs: 2.3 10*3/uL (ref 0.7–4.0)
MCV: 85.6 fl (ref 78.0–100.0)
Monocytes Absolute: 0.6 10*3/uL (ref 0.1–1.0)
Monocytes Relative: 10.2 % (ref 3.0–12.0)
Neutro Abs: 3.1 10*3/uL (ref 1.4–7.7)
Platelets: 207 10*3/uL (ref 150.0–400.0)
WBC: 6.3 10*3/uL (ref 4.5–10.5)

## 2013-05-20 NOTE — Patient Instructions (Signed)
Carotid artery Doppler study as discussed  Call if any recurrent symptoms  Follow Dr. Lovell Sheehan in 2 weeks

## 2013-05-20 NOTE — Progress Notes (Signed)
Subjective:    Patient ID: Cindy Schwartz, female    DOB: 1934/06/06, 79 y.o.   MRN: [id number]  HPI   79 year old patient who is seen today  After 2 possiblesyncopal episodes.  The patient was vacationing in [place] and the day after her arrival on June 19 she was attending a church services. She became slightly weak and nauseated and had an apparent syncopal episode. On July 3 while asleep she ended up on the floor beside her bed with some facial contusions. No other prior history of syncope. No new medications. She does complain of being slightly unsteady when she walks but no headaches or focal neurological symptoms.  Past Medical History  Diagnosis Date  . Osteoporosis   . Aneurysm of ascending aorta   . History of ovarian cancer   . Raynaud's disease   . GERD (gastroesophageal reflux disease)   . Insomnia     History   Social History  . Marital Status: Married    Spouse Name: N/A    Number of Children: N/A  . Years of Education: N/A   Occupational History  . retired    Social History Main Topics  . Smoking status: Never Smoker   . Smokeless tobacco: Never Used  . Alcohol Use: No  . Drug Use: No  . Sexual Activity: Not on file   Other Topics Concern  . Not on file   Social History Narrative  . No narrative on file    Past Surgical History  Procedure Laterality Date  . Ovarian ca s/p taj/bso  1979  . Abdominal hysterectomy    . Appendectomy    . Bilateral salpingoophorectomy      No family history on file.  No Known Allergies  Current Outpatient Prescriptions on File Prior to Visit  Medication Sig Dispense Refill  . azelastine (ASTELIN) 137 MCG/SPRAY nasal spray Place 1 spray into the nose 2 (two) times daily. Use in each nostril as directed  30 mL  12  . betamethasone, augmented, (DIPROLENE) 0.05 % lotion Apply topically 2 (two) times daily.  30 mL  0  . calcium-vitamin D 250-100 MG-UNIT per tablet Take 1 tablet by mouth daily.        . cetirizine (ZYRTEC)  10 MG tablet Take 10 mg by mouth daily as needed.       . Cholecalciferol (VITAMIN D) 1000 UNITS capsule Take 1,000 Units by mouth daily.        . Clobetasol Prop Emollient Base (CLOBETASOL PROPIONATE E) 0.05 % emollient cream Apply 1 application topically 2 (two) times daily.  30 g  3  . fish oil-omega-3 fatty acids 1000 MG capsule Take 2 g by mouth daily.      . Magnesium 300 MG CAPS Take by mouth daily.        . mometasone (NASONEX) 50 MCG/ACT nasal spray Place 2 sprays into the nose daily.  17 g  12  . NEXIUM 40 MG capsule TAKE 1 CAPSULE DAILY BEFORE BREAKFAST  90 capsule  2  . valACYclovir (VALTREX) 500 MG tablet Take 1 tablet (500 mg total) by mouth daily as needed (PT REQUEST NAME BRAND-NO GENERICS).  30 tablet  2   No current facility-administered medications on file prior to visit.    BP 120/70  Pulse 66  Temp(Src) 98.2 F (36.8 C) (Oral)  Resp 20  Wt 145 lb (65.772 kg)  BMI 22.05 kg/m2  SpO2 98%       Review of Systems  Constitutional:  Negative.   HENT: Negative for hearing loss, congestion, sore throat, rhinorrhea, dental problem, sinus pressure and tinnitus.   Eyes: Negative for pain, discharge and visual disturbance.  Respiratory: Negative for cough and shortness of breath.   Cardiovascular: Negative for chest pain, palpitations and leg swelling.  Gastrointestinal: Negative for nausea, vomiting, abdominal pain, diarrhea, constipation, blood in stool and abdominal distention.  Genitourinary: Negative for dysuria, urgency, frequency, hematuria, flank pain, vaginal bleeding, vaginal discharge, difficulty urinating, vaginal pain and pelvic pain.  Musculoskeletal: Negative for joint swelling, arthralgias and gait problem.  Skin: Negative for rash.  Neurological: Positive for syncope. Negative for dizziness, speech difficulty, weakness, numbness and headaches.  Hematological: Negative for adenopathy.  Psychiatric/Behavioral: Negative for behavioral problems, dysphoric  mood and agitation. The patient is not nervous/anxious.        Objective:   Physical Exam  Constitutional: She is oriented to person, place, and time. She appears well-developed and well-nourished.  Blood pressure 120/65 without orthostatic changes  HENT:  Head: Normocephalic.  Right Ear: External ear normal.  Left Ear: External ear normal.  Mouth/Throat: Oropharynx is clear and moist.  Eyes: Conjunctivae and EOM are normal. Pupils are equal, round, and reactive to light.  Neck: Normal range of motion. Neck supple. No thyromegaly present.  No bruits Carotid upstroke brisk  Cardiovascular: Normal rate, regular rhythm, normal heart sounds and intact distal pulses.   No murmur heard. Intact right dorsalis pedis pulse. Other pedal pulses not easily palpable  Pulmonary/Chest: Effort normal and breath sounds normal.  Abdominal: Soft. Bowel sounds are normal. She exhibits no mass. There is no tenderness.  Musculoskeletal: Normal range of motion.  Lymphadenopathy:    She has no cervical adenopathy.  Neurological: She is alert and oriented to person, place, and time.  Nonfocal exam No drift Gait normal Able to perform a tandem walk  Skin: Skin is warm and dry. No rash noted.  Psychiatric: She has a normal mood and affect. Her behavior is normal.          Assessment & Plan:   Possible syncope. First episode sounds vasovagal and second episode could easily be simply a fall off her bed while sleeping. We'll proceed with the screening lab carotid artery Doppler studies and EKG. Will followup with PCP in 2 weeks

## 2013-05-26 ENCOUNTER — Encounter (INDEPENDENT_AMBULATORY_CARE_PROVIDER_SITE_OTHER): Payer: Medicare Other

## 2013-05-26 DIAGNOSIS — R55 Syncope and collapse: Secondary | ICD-10-CM

## 2013-06-01 ENCOUNTER — Ambulatory Visit (HOSPITAL_COMMUNITY): Payer: Medicare Other | Attending: Cardiology

## 2013-06-01 DIAGNOSIS — Z8543 Personal history of malignant neoplasm of ovary: Secondary | ICD-10-CM | POA: Insufficient documentation

## 2013-06-01 DIAGNOSIS — R55 Syncope and collapse: Secondary | ICD-10-CM | POA: Insufficient documentation

## 2013-06-01 DIAGNOSIS — I079 Rheumatic tricuspid valve disease, unspecified: Secondary | ICD-10-CM | POA: Diagnosis not present

## 2013-06-01 DIAGNOSIS — I059 Rheumatic mitral valve disease, unspecified: Secondary | ICD-10-CM | POA: Diagnosis not present

## 2013-06-01 DIAGNOSIS — I7300 Raynaud's syndrome without gangrene: Secondary | ICD-10-CM | POA: Insufficient documentation

## 2013-06-01 NOTE — Progress Notes (Signed)
Echocardiogram performed.  

## 2013-06-02 ENCOUNTER — Ambulatory Visit
Admission: RE | Admit: 2013-06-02 | Discharge: 2013-06-02 | Disposition: A | Discharge status: Home / Self Care | Payer: Medicare Other | Source: Ambulatory Visit | Attending: Internal Medicine | Admitting: Internal Medicine

## 2013-06-02 DIAGNOSIS — S0990XA Unspecified injury of head, initial encounter: Secondary | ICD-10-CM | POA: Diagnosis not present

## 2013-06-02 DIAGNOSIS — R55 Syncope and collapse: Secondary | ICD-10-CM

## 2013-06-07 ENCOUNTER — Telehealth: Payer: Self-pay | Admitting: Internal Medicine

## 2013-06-07 NOTE — Telephone Encounter (Signed)
Pt had MRI Friday and would like results.

## 2013-06-08 NOTE — Telephone Encounter (Signed)
Please advise 

## 2013-06-09 DIAGNOSIS — D2370 Other benign neoplasm of skin of unspecified lower limb, including hip: Secondary | ICD-10-CM | POA: Diagnosis not present

## 2013-06-09 DIAGNOSIS — L280 Lichen simplex chronicus: Secondary | ICD-10-CM | POA: Diagnosis not present

## 2013-06-09 DIAGNOSIS — L821 Other seborrheic keratosis: Secondary | ICD-10-CM | POA: Diagnosis not present

## 2013-06-09 NOTE — Telephone Encounter (Signed)
Please call/notify patient that lab/test/procedure is normal 

## 2013-06-09 NOTE — Telephone Encounter (Signed)
Please obtain Results of the MRI. I am not able to pull up on the EMR

## 2013-06-09 NOTE — Telephone Encounter (Signed)
Pt called notified her MRI was normal. Pt verbalized understanding.

## 2013-06-14 DIAGNOSIS — N9089 Other specified noninflammatory disorders of vulva and perineum: Secondary | ICD-10-CM | POA: Diagnosis not present

## 2013-06-14 DIAGNOSIS — Z01419 Encounter for gynecological examination (general) (routine) without abnormal findings: Secondary | ICD-10-CM | POA: Diagnosis not present

## 2013-06-20 ENCOUNTER — Ambulatory Visit: Payer: Medicare Other | Admitting: Internal Medicine

## 2013-06-23 DIAGNOSIS — H43819 Vitreous degeneration, unspecified eye: Secondary | ICD-10-CM | POA: Diagnosis not present

## 2013-06-23 DIAGNOSIS — H2640 Unspecified secondary cataract: Secondary | ICD-10-CM | POA: Diagnosis not present

## 2013-06-23 DIAGNOSIS — Z961 Presence of intraocular lens: Secondary | ICD-10-CM | POA: Diagnosis not present

## 2013-06-23 DIAGNOSIS — H52209 Unspecified astigmatism, unspecified eye: Secondary | ICD-10-CM | POA: Diagnosis not present

## 2013-07-06 ENCOUNTER — Encounter: Payer: Medicare Other | Admitting: Internal Medicine

## 2013-07-13 ENCOUNTER — Encounter: Payer: Self-pay | Admitting: Internal Medicine

## 2013-07-13 ENCOUNTER — Ambulatory Visit (INDEPENDENT_AMBULATORY_CARE_PROVIDER_SITE_OTHER): Payer: Medicare Other | Admitting: Internal Medicine

## 2013-07-13 VITALS — BP 120/70 | HR 72 | Temp 98.2°F | Resp 16 | Ht >= 80 in | Wt 144.0 lb

## 2013-07-13 DIAGNOSIS — T887XXA Unspecified adverse effect of drug or medicament, initial encounter: Secondary | ICD-10-CM | POA: Diagnosis not present

## 2013-07-13 DIAGNOSIS — E785 Hyperlipidemia, unspecified: Secondary | ICD-10-CM

## 2013-07-13 DIAGNOSIS — Z0000 Encounter for general adult medical examination without abnormal findings: Secondary | ICD-10-CM

## 2013-07-13 DIAGNOSIS — I83893 Varicose veins of bilateral lower extremities with other complications: Secondary | ICD-10-CM

## 2013-07-13 DIAGNOSIS — F411 Generalized anxiety disorder: Secondary | ICD-10-CM | POA: Diagnosis not present

## 2013-07-13 DIAGNOSIS — L299 Pruritus, unspecified: Secondary | ICD-10-CM | POA: Diagnosis not present

## 2013-07-13 DIAGNOSIS — K589 Irritable bowel syndrome without diarrhea: Secondary | ICD-10-CM | POA: Diagnosis not present

## 2013-07-13 DIAGNOSIS — M545 Low back pain, unspecified: Secondary | ICD-10-CM

## 2013-07-13 DIAGNOSIS — Z23 Encounter for immunization: Secondary | ICD-10-CM

## 2013-07-13 DIAGNOSIS — J984 Other disorders of lung: Secondary | ICD-10-CM

## 2013-07-13 LAB — POCT URINALYSIS DIPSTICK
Glucose, UA: NEGATIVE
Leukocytes, UA: NEGATIVE
Nitrite, UA: NEGATIVE
Spec Grav, UA: 1.015
Urobilinogen, UA: 0.2

## 2013-07-13 LAB — CBC WITH DIFFERENTIAL/PLATELET
Basophils Relative: 0.6 % (ref 0.0–3.0)
Eosinophils Absolute: 0.2 10*3/uL (ref 0.0–0.7)
Eosinophils Relative: 3.2 % (ref 0.0–5.0)
Lymphocytes Relative: 36.7 % (ref 12.0–46.0)
MCHC: 33.5 g/dL (ref 30.0–36.0)
Monocytes Absolute: 0.4 10*3/uL (ref 0.1–1.0)
Neutrophils Relative %: 50.4 % (ref 43.0–77.0)
Platelets: 215 10*3/uL (ref 150.0–400.0)
RBC: 4.68 Mil/uL (ref 3.87–5.11)
WBC: 4.7 10*3/uL (ref 4.5–10.5)

## 2013-07-13 LAB — LIPID PANEL
HDL: 74.8 mg/dL (ref >39.00)
LDL Cholesterol: 110 mg/dL — ABNORMAL HIGH (ref 0–99)
VLDL: 4.8 mg/dL (ref 0.0–40.0)

## 2013-07-13 LAB — TSH: TSH: 0.83 u[IU]/mL (ref 0.35–5.50)

## 2013-07-13 LAB — BASIC METABOLIC PANEL
BUN: 14 mg/dL (ref 6–23)
Chloride: 102 mEq/L (ref 96–112)
Glucose, Bld: 84 mg/dL (ref 70–99)
Potassium: 3.9 mEq/L (ref 3.5–5.1)
Sodium: 137 mEq/L (ref 135–145)

## 2013-07-13 LAB — HEPATIC FUNCTION PANEL
Bilirubin, Direct: 0.1 mg/dL (ref 0.0–0.3)
Total Bilirubin: 0.9 mg/dL (ref 0.3–1.2)

## 2013-07-13 MED ORDER — ESCITALOPRAM OXALATE 5 MG PO TABS: 5.0000 mg | ORAL_TABLET | Freq: Every day | ORAL | Status: DC

## 2013-07-13 MED ORDER — DESONIDE 0.05 % EX LOTN: TOPICAL_LOTION | Freq: Two times a day (BID) | CUTANEOUS | Status: DC

## 2013-07-13 NOTE — Progress Notes (Signed)
Subjective:    Patient ID: Cindy Schwartz, female    DOB: 1933-12-04, 79 y.o.   MRN: [id number]  HPI Patient is a 79 year old white female who presents for annual Medicare examination and followup of chronic medical problems including a history of a pulmonary nodule history of chronic low back pain history of esophageal reflux and history of osteoporosis with compression fracture.   Had a syncopal episode in [place] travelling with a concusion Increased itching of legs Increased anxiety    Review of Systems  Constitutional: Positive for fatigue. Negative for activity change and appetite change.  HENT: Negative for congestion, ear pain, postnasal drip and sinus pressure.   Eyes: Negative for redness and visual disturbance.  Respiratory: Positive for shortness of breath. Negative for cough and wheezing.   Gastrointestinal: Positive for abdominal distention. Negative for abdominal pain.  Genitourinary: Negative for dysuria, frequency and menstrual problem.  Musculoskeletal: Negative for arthralgias, joint swelling, myalgias and neck pain.  Skin: Negative for rash and wound.  Neurological: Negative for dizziness, weakness and headaches.  Hematological: Negative for adenopathy. Does not bruise/bleed easily.  Psychiatric/Behavioral: Negative for sleep disturbance and decreased concentration.   Past Medical History  Diagnosis Date  . Osteoporosis   . Aneurysm of ascending aorta   . History of ovarian cancer   . Raynaud's disease   . GERD (gastroesophageal reflux disease)   . Insomnia     History   Social History  . Marital Status: Married    Spouse Name: N/A    Number of Children: N/A  . Years of Education: N/A   Occupational History  . retired    Social History Main Topics  . Smoking status: Never Smoker   . Smokeless tobacco: Never Used  . Alcohol Use: No  . Drug Use: No  . Sexual Activity: Not on file   Other Topics Concern  . Not on file   Social History Narrative   . No narrative on file    Past Surgical History  Procedure Laterality Date  . Ovarian ca s/p taj/bso  1979  . Abdominal hysterectomy    . Appendectomy    . Bilateral salpingoophorectomy      No family history on file.  No Known Allergies  Current Outpatient Prescriptions on File Prior to Visit  Medication Sig Dispense Refill  . azelastine (ASTELIN) 137 MCG/SPRAY nasal spray Place 1 spray into the nose 2 (two) times daily. Use in each nostril as directed  30 mL  12  . calcium-vitamin D 250-100 MG-UNIT per tablet Take 1 tablet by mouth daily.        . cetirizine (ZYRTEC) 10 MG tablet Take 10 mg by mouth daily as needed.       . Cholecalciferol (VITAMIN D) 1000 UNITS capsule Take 1,000 Units by mouth daily.        . fish oil-omega-3 fatty acids 1000 MG capsule Take 2 g by mouth daily.      . Magnesium 300 MG CAPS Take by mouth daily.        Marland Kitchen NEXIUM 40 MG capsule TAKE 1 CAPSULE DAILY BEFORE BREAKFAST  90 capsule  2  . valACYclovir (VALTREX) 500 MG tablet Take 1 tablet (500 mg total) by mouth daily as needed (PT REQUEST NAME BRAND-NO GENERICS).  30 tablet  2   No current facility-administered medications on file prior to visit.    BP 120/70  Pulse 72  Temp(Src) 98.2 F (36.8 C)  Resp 16  Ht 8' (2.438 m)  Wt 144 lb (65.318 kg)  BMI 10.99 kg/m2       Objective:   Physical Exam  Nursing note and vitals reviewed. Constitutional: She is oriented to person, place, and time. She appears well-developed and well-nourished. No distress.  HENT:  Head: Normocephalic and atraumatic.  Eyes: Conjunctivae and EOM are normal. Pupils are equal, round, and reactive to light.  Neck: Normal range of motion. Neck supple. No JVD present. No tracheal deviation present. No thyromegaly present.  Cardiovascular: Normal rate.   Murmur heard. Pulmonary/Chest: Effort normal and breath sounds normal. She has no wheezes. She exhibits no tenderness.  Abdominal: Soft. Bowel sounds are normal. She  exhibits distension. There is tenderness.  Musculoskeletal: Normal range of motion. She exhibits no edema and no tenderness.  Lymphadenopathy:    She has no cervical adenopathy.  Neurological: She is alert and oriented to person, place, and time. She has normal reflexes. No cranial nerve deficit.  Skin: Skin is warm and dry. She is not diaphoretic.  Psychiatric:  anxious          Assessment & Plan:  Appropriate blood work to monitor the patient's chronic medical conditions including a history of esophageal reflux and a history of osteoporosis a vitamin D level calcium a basic metabolic panel and a CBC should be obtained today She is due appropriate screening for lipids And check the liver functions due to medications History of syncope while travelling in [place] with concussion MRI reviewed with patient  Add lexapro 5 mg Anti itch compound  Subjective:    Cindy Schwartz is a 79 y.o. female who presents for Medicare Annual/Subsequent preventive examination.  Preventive Screening-Counseling & Management  Tobacco History  Smoking status  . Never Smoker   Smokeless tobacco  . Never Used     Problems Prior to Visit 1.   Current Problems (verified) Patient Active Problem List   Diagnosis Date Noted  . Varicose veins of lower extremities with other complications 03/09/2012  . Hx of herpes simplex infection 12/18/2011  . COMPRESSION FRACTURE, THORACIC VERTEBRA 07/24/2010  . BACK PAIN, LUMBAR 06/28/2010  . NECK AND BACK PAIN 01/18/2010  . OSTEOARTHROSIS UNSPEC WHETHER GEN/LOC LOWER LEG 09/05/2009  . OSTEOARTHROS UNSPEC WHETHER GEN/LOC [facility] 02/02/2009  . GENERALIZED ANXIETY DISORDER 10/13/2008  . IRRITABLE BOWEL SYNDROME 10/13/2008  . HERPES SIMPLEX INFECTION 08/24/2008  . LUNG NODULE 11/25/2007  . RAYNAUD'S SYNDROME 07/01/2007  . ANEURYSM, THORACIC AORTIC 05/06/2007  . ALLERGIC RHINITIS, CHRONIC 05/06/2007  . Esophageal Reflux 05/06/2007  . DEGENERATIVE DISC  DISEASE, CERVICAL SPINE 05/06/2007  . OSTEOPOROSIS 03/18/2007    Medications Prior to Visit Current Outpatient Prescriptions on File Prior to Visit  Medication Sig Dispense Refill  . azelastine (ASTELIN) 137 MCG/SPRAY nasal spray Place 1 spray into the nose 2 (two) times daily. Use in each nostril as directed  30 mL  12  . calcium-vitamin D 250-100 MG-UNIT per tablet Take 1 tablet by mouth daily.        . cetirizine (ZYRTEC) 10 MG tablet Take 10 mg by mouth daily as needed.       . Cholecalciferol (VITAMIN D) 1000 UNITS capsule Take 1,000 Units by mouth daily.        . fish oil-omega-3 fatty acids 1000 MG capsule Take 2 g by mouth daily.      . Magnesium 300 MG CAPS Take by mouth daily.        Marland Kitchen NEXIUM 40 MG capsule TAKE 1 CAPSULE DAILY BEFORE BREAKFAST  90 capsule  2  . valACYclovir (VALTREX) 500 MG tablet Take 1 tablet (500 mg total) by mouth daily as needed (PT REQUEST NAME BRAND-NO GENERICS).  30 tablet  2   No current facility-administered medications on file prior to visit.    Current Medications (verified) Current Outpatient Prescriptions  Medication Sig Dispense Refill  . azelastine (ASTELIN) 137 MCG/SPRAY nasal spray Place 1 spray into the nose 2 (two) times daily. Use in each nostril as directed  30 mL  12  . calcium-vitamin D 250-100 MG-UNIT per tablet Take 1 tablet by mouth daily.        . cetirizine (ZYRTEC) 10 MG tablet Take 10 mg by mouth daily as needed.       . Cholecalciferol (VITAMIN D) 1000 UNITS capsule Take 1,000 Units by mouth daily.        . fish oil-omega-3 fatty acids 1000 MG capsule Take 2 g by mouth daily.      . Magnesium 300 MG CAPS Take by mouth daily.        Marland Kitchen NEXIUM 40 MG capsule TAKE 1 CAPSULE DAILY BEFORE BREAKFAST  90 capsule  2  . valACYclovir (VALTREX) 500 MG tablet Take 1 tablet (500 mg total) by mouth daily as needed (PT REQUEST NAME BRAND-NO GENERICS).  30 tablet  2  . desonide (DESOWEN) 0.05 % lotion Apply topically 2 (two) times daily. Mix  75/25  With sarna lotion  (75%)and desonide (25%)  59 mL  0  . escitalopram (LEXAPRO) 5 MG tablet Take 1 tablet (5 mg total) by mouth daily.  30 tablet  6   No current facility-administered medications for this visit.     Allergies (verified) Review of patient's allergies indicates no known allergies.   PAST HISTORY  Family History No family history on file.  Social History History  Substance Use Topics  . Smoking status: Never Smoker   . Smokeless tobacco: Never Used  . Alcohol Use: No     Are there smokers in your home (other than you)? No  Risk Factors Current exercise habits: Home exercise routine includes walking 1 hrs per day.  Dietary issues discussed: none   Cardiac risk factors: advanced age (older than 29 for men, 59 for women).  Depression Screen (Note: if answer to either of the following is "Yes", a more complete depression screening is indicated)   Over the past two weeks, have you felt down, depressed or hopeless? Yes  Over the past two weeks, have you felt little interest or pleasure in doing things? No  Have you lost interest or pleasure in daily life? No  Do you often feel hopeless? No  Do you cry easily over simple problems? No  Activities of Daily Living In your present state of health, do you have any difficulty performing the following activities?:  Driving? No Managing money?  No Feeding yourself? No Getting from bed to chair? No Climbing a flight of stairs? No Preparing food and eating?: No Bathing or showering? No Getting dressed: No Getting to the toilet? No Using the toilet:No Moving around from place to place: No In the past year have you fallen or had a near fall?:No   Are you sexually active?  Yes  Do you have more than one partner?  No  Hearing Difficulties: No Do you often ask people to speak up or repeat themselves? No Do you experience ringing or noises in your ears? No Do you have difficulty understanding soft or whispered  voices?  No   Do you feel that you have a problem with memory? No  Do you often misplace items? No  Do you feel safe at home?  Yes  Cognitive Testing  Alert? Yes  Normal Appearance?Yes  Oriented to person? No  Place? Yes   Time? Yes  Recall of three objects?  Yes  Can perform simple calculations? No  Displays appropriate judgment?Yes  Can read the correct time from a watch face?Yes   Advanced Directives have been discussed with the patient? No  List the Names of Other Physician/Practitioners you currently use: 1.    Indicate any recent Medical Services you may have received from other than [facility] providers in the past year (date may be approximate).  Immunization History  Administered Date(s) Administered  . Influenza Split 06/26/2011, 07/07/2012  . Influenza Whole 07/09/1999, 07/01/2007, 06/27/2008, 07/11/2009, 06/28/2010  . Influenza, High Dose Seasonal PF 07/13/2013  . Pneumococcal Polysaccharide 12/20/2012  . Td 09/23/2003  . Zoster 01/19/2013    Screening Tests Health Maintenance  Topic Date Due  . Influenza Vaccine  04/22/2013  . Tetanus/tdap  09/22/2013  . Colonoscopy  07/28/2022  . Pneumococcal Polysaccharide Vaccine Age 81 And Over  Completed  . Zostavax  Completed    All answers were reviewed with the patient and necessary referrals were made:  Carrie Mew, MD   07/13/2013   History reviewed: allergies, current medications, past family history, past medical history, past social history, past surgical history and problem list  Review of Systems Pertinent items are noted in HPI.    Objective:     Vision by Snellen chart: right eye:20/20, left eye:20/20  Body mass index is 10.99 kg/(m^2). BP 120/70  Pulse 72  Temp(Src) 98.2 F (36.8 C)  Resp 16  Ht 8' (2.438 m)  Wt 144 lb (65.318 kg)  BMI 10.99 kg/m2 Exam per problem focused     Assessment:      This is a routine physical examination for this healthy  Female. Reviewed all health  maintenance protocols including mammography colonoscopy bone density and reviewed appropriate screening labs. Her immunization history was reviewed as well as her current medications and allergies refills of her chronic medications were given and the plan for yearly health maintenance was discussed all orders and referrals were made as appropriate.      Plan:     During the course of the visit the patient was educated and counseled about appropriate screening and preventive services including:    Pneumococcal vaccine   Influenza vaccine  Diet review for nutrition referral? Yes ____  Not Indicated ____   Patient Instructions (the written plan) was given to the patient.  Medicare Attestation I have personally reviewed: The patient's medical and social history Their use of alcohol, tobacco or illicit drugs Their current medications and supplements The patient's functional ability including ADLs,fall risks, home safety risks, cognitive, and hearing and visual impairment Diet and physical activities Evidence for depression or mood disorders  The patient's weight, height, BMI, and visual acuity have been recorded in the chart.  I have made referrals, counseling, and provided education to the patient based on review of the above and I have provided the patient with a written personalized care plan for preventive services.     Carrie Mew, MD   07/13/2013

## 2013-08-22 DIAGNOSIS — R1013 Epigastric pain: Secondary | ICD-10-CM | POA: Diagnosis not present

## 2013-08-22 DIAGNOSIS — K5900 Constipation, unspecified: Secondary | ICD-10-CM | POA: Diagnosis not present

## 2013-08-22 DIAGNOSIS — K219 Gastro-esophageal reflux disease without esophagitis: Secondary | ICD-10-CM | POA: Diagnosis not present

## 2013-09-09 IMAGING — MG MM DIGITAL SCREENING BILAT
5 series · 5 of 5 positions shown · non-contrast
Comparison: Previous exams.

CLINICAL DATA: Screening.

DIGITAL BILATERAL SCREENING MAMMOGRAM WITH CAD

[R CC]
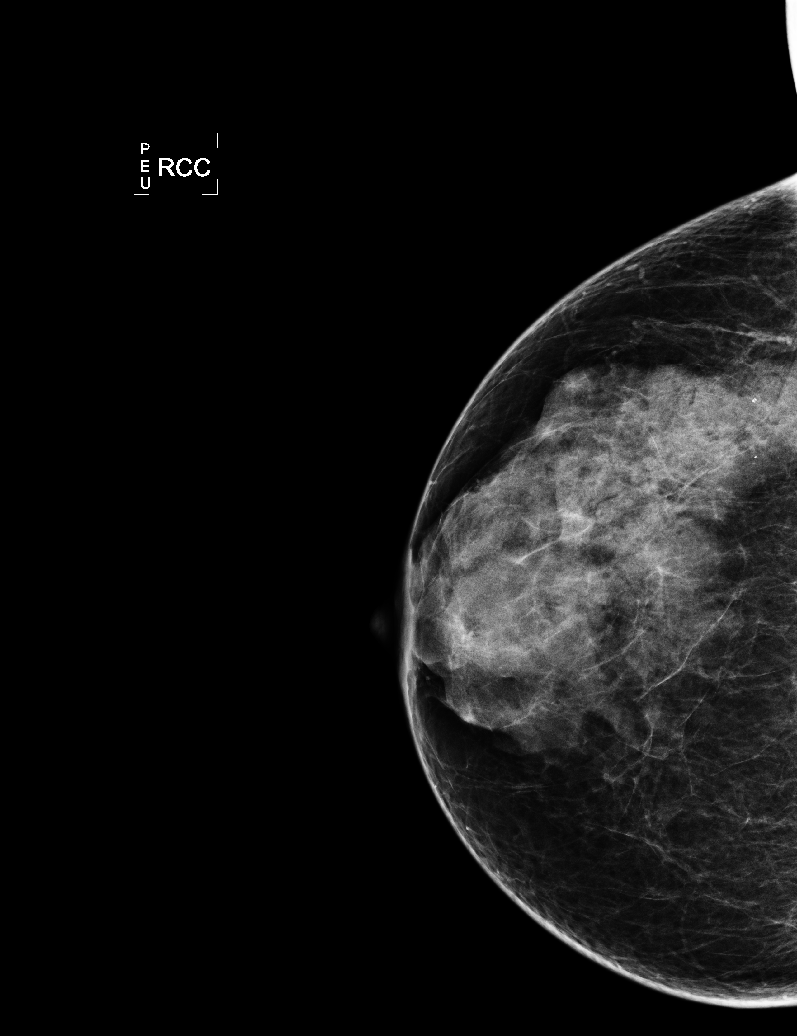

[L CC]
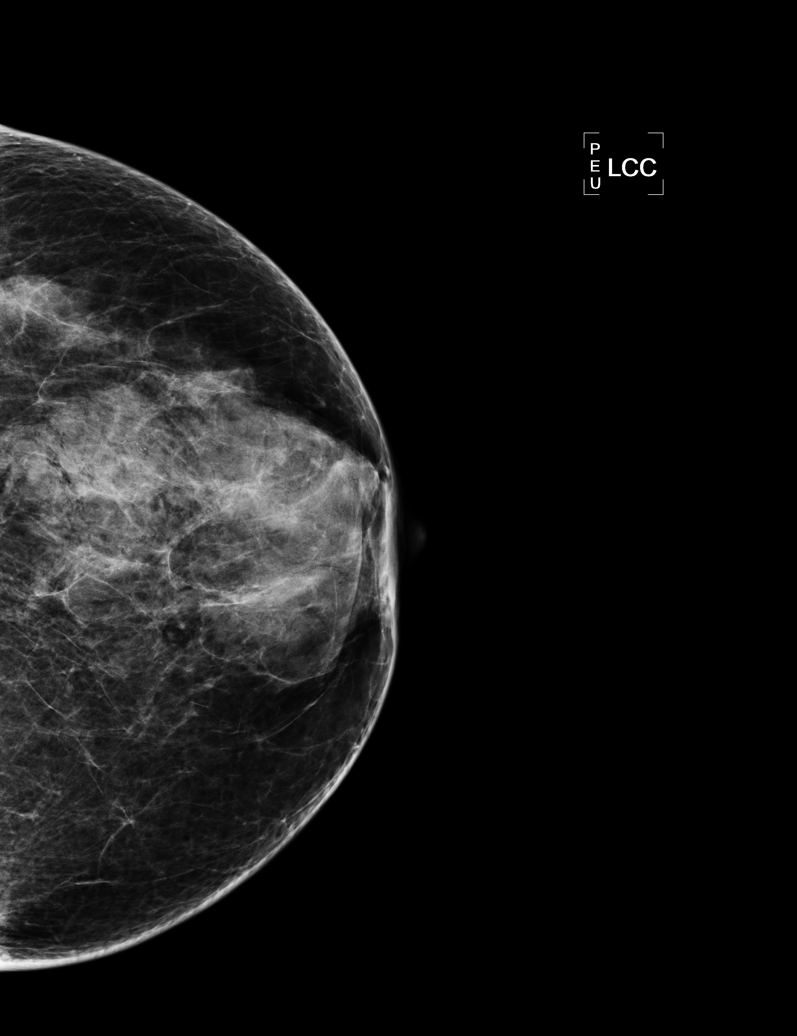

[L MLO (1 of 2)]
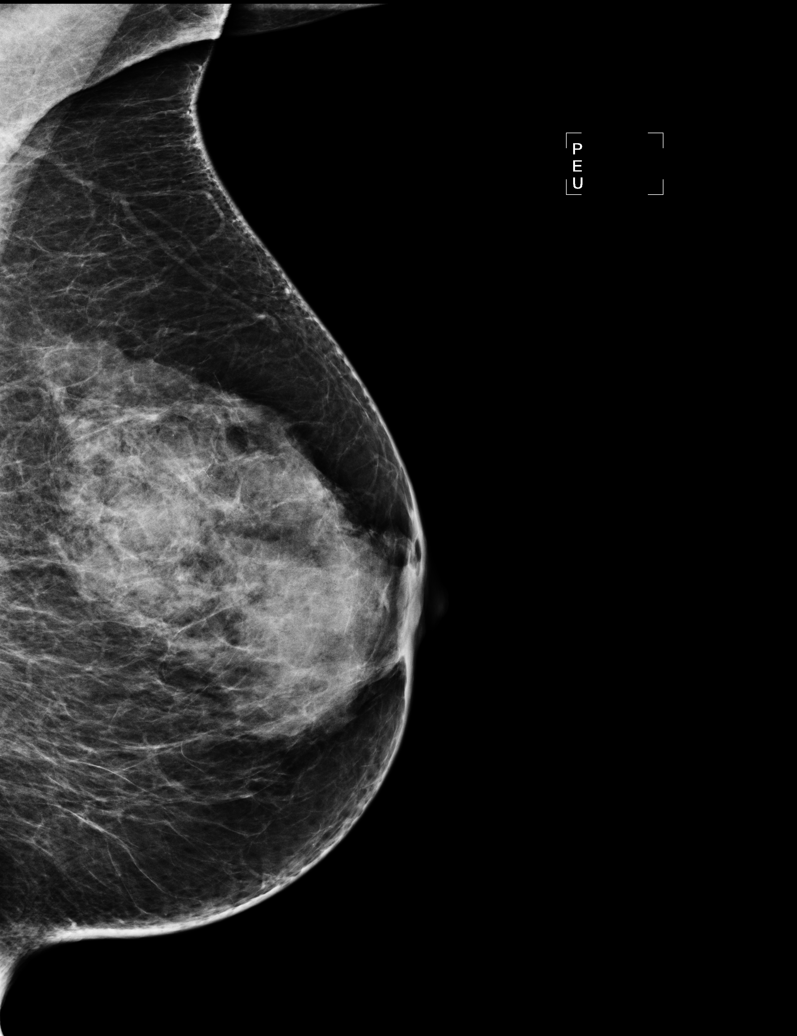

[R MLO]
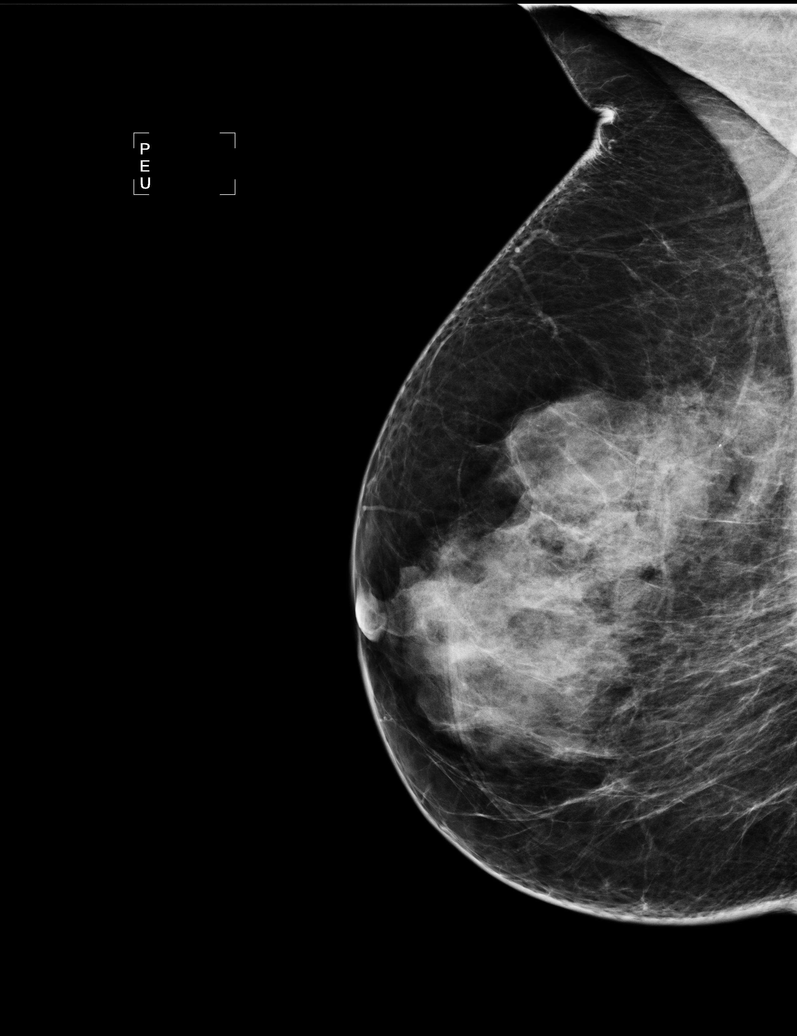

[L MLO (2 of 2)]
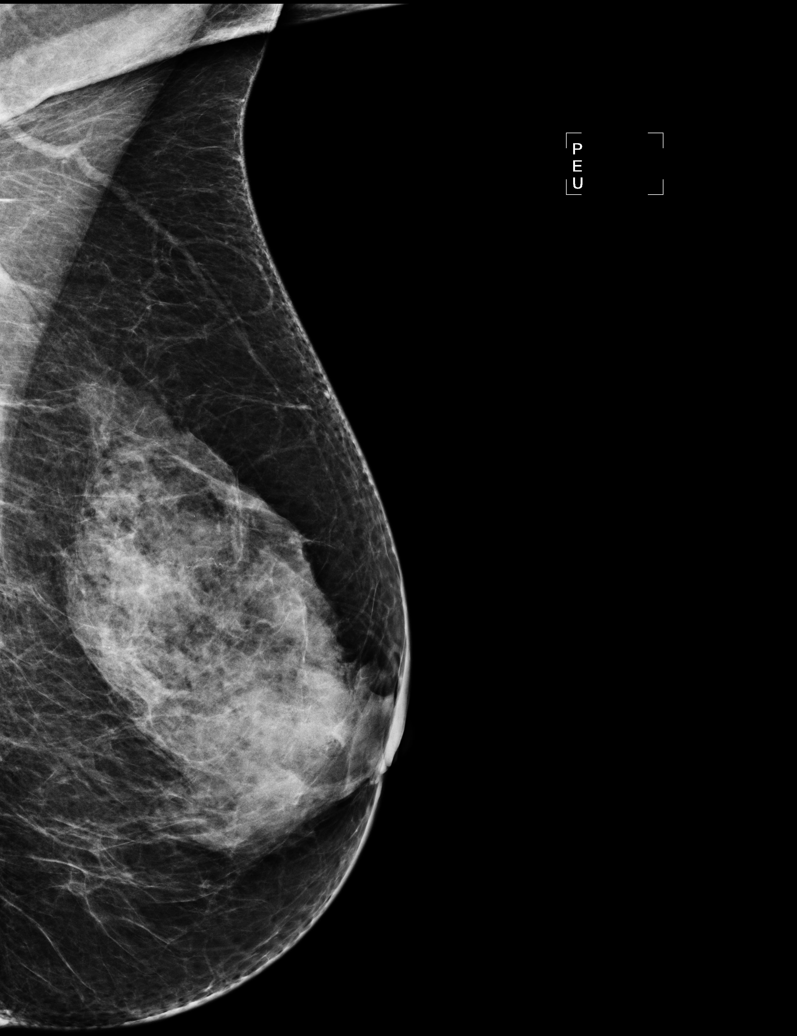

[5 of 5 positions shown; findings below may reference images not displayed]

FINDINGS: ACR Breast Density Category 4: The breast tissue is extremely
dense.

No suspicious masses, architectural distortion, or calcifications
are present.

Images were processed with CAD.
IMPRESSION: No mammographic evidence of malignancy.

A result letter of this screening mammogram will be mailed directly
to the patient.

RECOMMENDATION:
Screening mammogram in one year. (Code:RG-N-SSZ)

BI-RADS CATEGORY 1:  Negative.

## 2013-09-13 ENCOUNTER — Ambulatory Visit (INDEPENDENT_AMBULATORY_CARE_PROVIDER_SITE_OTHER): Payer: Medicare Other | Admitting: Family

## 2013-09-13 ENCOUNTER — Encounter: Payer: Self-pay | Admitting: Family

## 2013-09-13 VITALS — BP 122/80 | HR 63 | Temp 98.5°F | Wt 149.0 lb

## 2013-09-13 DIAGNOSIS — J019 Acute sinusitis, unspecified: Secondary | ICD-10-CM | POA: Diagnosis not present

## 2013-09-13 DIAGNOSIS — R05 Cough: Secondary | ICD-10-CM

## 2013-09-13 MED ORDER — FLUTICASONE PROPIONATE 50 MCG/ACT NA SUSP: 2.0000 | Freq: Every day | NASAL | Status: DC

## 2013-09-13 MED ORDER — AMOXICILLIN 500 MG PO TABS: 1000.0000 mg | ORAL_TABLET | Freq: Two times a day (BID) | ORAL | Status: AC

## 2013-09-13 NOTE — Patient Instructions (Signed)

## 2013-09-13 NOTE — Progress Notes (Signed)
Subjective:    Patient ID: Cindy Schwartz, female    DOB: [birth year], 79 y.o.   MRN: [id number]  HPI 79 year old female, nonsmoker, patient of Dr. Lovell Sheehan presents today with complaints of sinus congestion, chest congestion, sinus pressure and pain x1 week. Reports having a fever of 99.8. Sincerely, Advair, vitamin C, tea with honey, and garlic. Denies any sick contacts.   Review of Systems  Constitutional: Negative.   HENT: Positive for congestion and sinus pressure.   Respiratory: Positive for cough.   Cardiovascular: Negative.   Endocrine: Negative.   Musculoskeletal: Negative.   Skin: Negative.   Allergic/Immunologic: Negative.   Neurological: Negative.   Psychiatric/Behavioral: Negative.    Past Medical History  Diagnosis Date  . Osteoporosis   . Aneurysm of ascending aorta   . History of ovarian cancer   . Raynaud's disease   . GERD (gastroesophageal reflux disease)   . Insomnia     History   Social History  . Marital Status: Married    Spouse Name: N/A    Number of Children: N/A  . Years of Education: N/A   Occupational History  . retired    Social History Main Topics  . Smoking status: Never Smoker   . Smokeless tobacco: Never Used  . Alcohol Use: No  . Drug Use: No  . Sexual Activity: Not on file   Other Topics Concern  . Not on file   Social History Narrative  . No narrative on file    Past Surgical History  Procedure Laterality Date  . Ovarian ca s/p taj/bso  1979  . Abdominal hysterectomy    . Appendectomy    . Bilateral salpingoophorectomy      No family history on file.  No Known Allergies  Current Outpatient Prescriptions on File Prior to Visit  Medication Sig Dispense Refill  . calcium-vitamin D 250-100 MG-UNIT per tablet Take 1 tablet by mouth daily.        . cetirizine (ZYRTEC) 10 MG tablet Take 10 mg by mouth daily as needed.       . Cholecalciferol (VITAMIN D) 1000 UNITS capsule Take 1,000 Units by mouth daily.        Marland Kitchen  desonide (DESOWEN) 0.05 % lotion Apply topically 2 (two) times daily. Mix 75/25  With sarna lotion  (75%)and desonide (25%)  59 mL  0  . escitalopram (LEXAPRO) 5 MG tablet Take 1 tablet (5 mg total) by mouth daily.  30 tablet  6  . fish oil-omega-3 fatty acids 1000 MG capsule Take 2 g by mouth daily.      . Magnesium 300 MG CAPS Take by mouth daily.        Marland Kitchen NEXIUM 40 MG capsule TAKE 1 CAPSULE DAILY BEFORE BREAKFAST  90 capsule  2  . valACYclovir (VALTREX) 500 MG tablet Take 1 tablet (500 mg total) by mouth daily as needed (PT REQUEST NAME BRAND-NO GENERICS).  30 tablet  2   No current facility-administered medications on file prior to visit.    BP 122/80  Pulse 63  Temp(Src) 98.5 F (36.9 C) (Oral)  Wt 149 lb (67.586 kg)  SpO2 99%chart    Objective:   Physical Exam  Constitutional: She is oriented to person, place, and time. She appears well-developed and well-nourished.  HENT:  Right Ear: External ear normal.  Left Ear: External ear normal.  Nose: Nose normal.  Sinus tenderness to palpation of the left maxillary and ethmoid sinus  Neck: Normal range of motion.  Neck supple.  Cardiovascular: Normal rate, regular rhythm and normal heart sounds.   Pulmonary/Chest: Effort normal and breath sounds normal.  Musculoskeletal: Normal range of motion.  Neurological: She is alert and oriented to person, place, and time.  Skin: Skin is warm and dry.  Psychiatric: She has a normal mood and affect.          Assessment & Plan:  Assessment: 1. Acute sinusitis 2. Cough  Plan: amoxicillin 500 mg 2 capsules via twice a day x10 days. Flonase 2 sprays in a social one today. Patient by the opposite symptoms worsen or persist. Recheck as scheduled, and as needed.

## 2013-10-12 DIAGNOSIS — J309 Allergic rhinitis, unspecified: Secondary | ICD-10-CM | POA: Diagnosis not present

## 2013-11-03 ENCOUNTER — Encounter: Payer: Self-pay | Admitting: Internal Medicine

## 2013-11-03 ENCOUNTER — Ambulatory Visit (INDEPENDENT_AMBULATORY_CARE_PROVIDER_SITE_OTHER): Payer: Medicare Other | Admitting: Internal Medicine

## 2013-11-03 VITALS — BP 120/74 | HR 78 | Temp 98.9°F | Resp 18 | Ht 68.0 in | Wt 149.0 lb

## 2013-11-03 DIAGNOSIS — J309 Allergic rhinitis, unspecified: Secondary | ICD-10-CM

## 2013-11-03 DIAGNOSIS — M545 Low back pain, unspecified: Secondary | ICD-10-CM

## 2013-11-03 DIAGNOSIS — R300 Dysuria: Secondary | ICD-10-CM

## 2013-11-03 DIAGNOSIS — J069 Acute upper respiratory infection, unspecified: Secondary | ICD-10-CM | POA: Diagnosis not present

## 2013-11-03 LAB — POCT URINALYSIS DIPSTICK
BILIRUBIN UA: NEGATIVE
Blood, UA: NEGATIVE
Glucose, UA: NEGATIVE
LEUKOCYTES UA: NEGATIVE
NITRITE UA: NEGATIVE
Protein, UA: NEGATIVE
Spec Grav, UA: 1.02
Urobilinogen, UA: 0.2
pH, UA: 6

## 2013-11-03 NOTE — Progress Notes (Signed)
Subjective:    Patient ID: Cindy Schwartz, female    DOB: [birth year], 80 y.o.   MRN: [id number]  HPI  80 year old patient who has a history of generalized anxiety disorder as well as allergic rhinitis. She presents with a two-day history of some bladder fullness and some urinary frequency. Her chief complaint is nasal congestion sore throat and postnasal drip. She does have a history of allergic rhinitis but has not been using either Zyrtec or fluticasone. No true dysuria urgency.  Past Medical History  Diagnosis Date  . Osteoporosis   . Aneurysm of ascending aorta   . History of ovarian cancer   . Raynaud's disease   . GERD (gastroesophageal reflux disease)   . Insomnia     History   Social History  . Marital Status: Married    Spouse Name: N/A    Number of Children: N/A  . Years of Education: N/A   Occupational History  . retired    Social History Main Topics  . Smoking status: Never Smoker   . Smokeless tobacco: Never Used  . Alcohol Use: No  . Drug Use: No  . Sexual Activity: Not on file   Other Topics Concern  . Not on file   Social History Narrative  . No narrative on file    Past Surgical History  Procedure Laterality Date  . Ovarian ca s/p taj/bso  1979  . Abdominal hysterectomy    . Appendectomy    . Bilateral salpingoophorectomy      No family history on file.  No Known Allergies  Current Outpatient Prescriptions on File Prior to Visit  Medication Sig Dispense Refill  . calcium-vitamin D 250-100 MG-UNIT per tablet Take 1 tablet by mouth daily.        . cetirizine (ZYRTEC) 10 MG tablet Take 10 mg by mouth daily as needed.       . Cholecalciferol (VITAMIN D) 1000 UNITS capsule Take 1,000 Units by mouth daily.        . fish oil-omega-3 fatty acids 1000 MG capsule Take 2 g by mouth daily.      . fluticasone (FLONASE) 50 MCG/ACT nasal spray Place 2 sprays into both nostrils daily.  16 g  6  . Magnesium 300 MG CAPS Take by mouth daily.        Marland Kitchen NEXIUM  40 MG capsule TAKE 1 CAPSULE DAILY BEFORE BREAKFAST  90 capsule  2  . valACYclovir (VALTREX) 500 MG tablet Take 1 tablet (500 mg total) by mouth daily as needed (PT REQUEST NAME BRAND-NO GENERICS).  30 tablet  2   No current facility-administered medications on file prior to visit.    BP 120/74  Pulse 78  Temp(Src) 98.9 F (37.2 C) (Oral)  Resp 18  Ht 5\' 8"  (1.727 m)  Wt 149 lb (67.586 kg)  BMI 22.66 kg/m2  SpO2 98%       Review of Systems  Constitutional: Negative.   HENT: Positive for postnasal drip, rhinorrhea, sinus pressure and sore throat. Negative for congestion, dental problem, hearing loss and tinnitus.   Eyes: Negative for pain, discharge and visual disturbance.  Respiratory: Negative for cough and shortness of breath.   Cardiovascular: Negative for chest pain, palpitations and leg swelling.  Gastrointestinal: Positive for abdominal pain. Negative for nausea, vomiting, diarrhea, constipation, blood in stool and abdominal distention.  Genitourinary: Negative for dysuria, urgency, frequency, hematuria, flank pain, vaginal bleeding, vaginal discharge, difficulty urinating, vaginal pain and pelvic pain.  Musculoskeletal: Negative for  arthralgias, gait problem and joint swelling.  Skin: Negative for rash.  Neurological: Negative for dizziness, syncope, speech difficulty, weakness, numbness and headaches.  Hematological: Negative for adenopathy.  Psychiatric/Behavioral: Negative for behavioral problems, dysphoric mood and agitation. The patient is not nervous/anxious.        Objective:   Physical Exam  Constitutional: She is oriented to person, place, and time. She appears well-developed and well-nourished.  HENT:  Head: Normocephalic.  Right Ear: External ear normal.  Left Ear: External ear normal.  Mouth/Throat: Oropharynx is clear and moist.  No sinus tenderness  Eyes: Conjunctivae and EOM are normal. Pupils are equal, round, and reactive to light.  Neck: Normal  range of motion. Neck supple. No thyromegaly present.  Cardiovascular: Normal rate, regular rhythm, normal heart sounds and intact distal pulses.   Pulmonary/Chest: Effort normal and breath sounds normal.  Abdominal: Soft. Bowel sounds are normal. She exhibits no mass. There is no tenderness.  Musculoskeletal: Normal range of motion.  Lymphadenopathy:    She has no cervical adenopathy.  Neurological: She is alert and oriented to person, place, and time.  Skin: Skin is warm and dry. No rash noted.  Psychiatric: She has a normal mood and affect. Her behavior is normal.          Assessment & Plan:   Viral URI Allergic rhinitis  We'll treat symptomatically. The patient will resume  Zyrtec and fluticasone nasal spray. Use Tylenol when necessary

## 2013-11-03 NOTE — Patient Instructions (Signed)
Take [phone] mg of Tylenol every 6 hours as needed for pain relief or fever.  Avoid taking more than 3000 mg in a 24-hour period (  This may cause liver damage).  Resume both Zyrtec and fluticasone nasal spray  Call or return to clinic prn if these symptoms worsen or fail to improve as anticipated.

## 2013-11-03 NOTE — Progress Notes (Signed)
Pre-visit discussion using our clinic review tool. No additional management support is needed unless otherwise documented below in the visit note.  

## 2013-12-07 ENCOUNTER — Encounter: Payer: Self-pay | Admitting: Internal Medicine

## 2013-12-07 ENCOUNTER — Ambulatory Visit (INDEPENDENT_AMBULATORY_CARE_PROVIDER_SITE_OTHER): Payer: Medicare Other | Admitting: Internal Medicine

## 2013-12-07 VITALS — BP 130/70 | HR 72 | Temp 98.2°F | Wt 146.0 lb

## 2013-12-07 DIAGNOSIS — K219 Gastro-esophageal reflux disease without esophagitis: Secondary | ICD-10-CM | POA: Diagnosis not present

## 2013-12-07 DIAGNOSIS — R51 Headache: Secondary | ICD-10-CM | POA: Diagnosis not present

## 2013-12-07 DIAGNOSIS — B0059 Other herpesviral disease of eye: Secondary | ICD-10-CM

## 2013-12-07 DIAGNOSIS — R519 Headache, unspecified: Secondary | ICD-10-CM

## 2013-12-07 DIAGNOSIS — J309 Allergic rhinitis, unspecified: Secondary | ICD-10-CM

## 2013-12-07 MED ORDER — VALACYCLOVIR HCL 500 MG PO TABS: 500.0000 mg | ORAL_TABLET | Freq: Every day | ORAL | Status: DC | PRN

## 2013-12-07 MED ORDER — ESOMEPRAZOLE MAGNESIUM 40 MG PO CPDR: DELAYED_RELEASE_CAPSULE | ORAL | Status: DC

## 2013-12-07 NOTE — Progress Notes (Signed)
Subjective:    Patient ID: Cindy Schwartz, female    DOB: Sep 13, 1934, 80 y.o.   MRN: [id number]  HPI Stays active Stress and anxiety is her main issue reviewed labs from [place] visit with good lipids and no DM Has seasonal allergies and sinusitis with two episodes and ENT referral to [facility].     Review of Systems  Constitutional: Negative for activity change, appetite change and fatigue.  HENT: Negative for congestion, ear pain, postnasal drip and sinus pressure.   Eyes: Negative for redness and visual disturbance.  Respiratory: Negative for cough, shortness of breath and wheezing.   Gastrointestinal: Negative for abdominal pain and abdominal distention.  Genitourinary: Negative for dysuria, frequency and menstrual problem.  Musculoskeletal: Negative for arthralgias, joint swelling, myalgias and neck pain.  Skin: Negative for rash and wound.  Neurological: Negative for dizziness, weakness and headaches.  Hematological: Negative for adenopathy. Does not bruise/bleed easily.  Psychiatric/Behavioral: Negative for sleep disturbance and decreased concentration.   Past Medical History  Diagnosis Date  . Osteoporosis   . Aneurysm of ascending aorta   . History of ovarian cancer   . Raynaud's disease   . GERD (gastroesophageal reflux disease)   . Insomnia     History   Social History  . Marital Status: Married    Spouse Name: N/A    Number of Children: N/A  . Years of Education: N/A   Occupational History  . retired    Social History Main Topics  . Smoking status: Never Smoker   . Smokeless tobacco: Never Used  . Alcohol Use: No  . Drug Use: No  . Sexual Activity: Not on file   Other Topics Concern  . Not on file   Social History Narrative  . No narrative on file    Past Surgical History  Procedure Laterality Date  . Ovarian ca s/p taj/bso  1979  . Abdominal hysterectomy    . Appendectomy    . Bilateral salpingoophorectomy      No family history on  file.  No Known Allergies  Current Outpatient Prescriptions on File Prior to Visit  Medication Sig Dispense Refill  . calcium-vitamin D 250-100 MG-UNIT per tablet Take 1 tablet by mouth daily.        . cetirizine (ZYRTEC) 10 MG tablet Take 10 mg by mouth daily as needed.       . Cholecalciferol (VITAMIN D) 1000 UNITS capsule Take 1,000 Units by mouth daily.        . fish oil-omega-3 fatty acids 1000 MG capsule Take 2 g by mouth daily.      . Magnesium 300 MG CAPS Take by mouth daily.        . fluticasone (FLONASE) 50 MCG/ACT nasal spray Place 2 sprays into both nostrils daily.  16 g  6   No current facility-administered medications on file prior to visit.    BP 130/70  Pulse 72  Temp(Src) 98.2 F (36.8 C)  Wt 146 lb (66.225 kg)  SpO2 94%       Objective:   Physical Exam  Nursing note reviewed. Constitutional: She is oriented to person, place, and time. She appears well-developed and well-nourished. No distress.  HENT:  Head: Normocephalic and atraumatic.  Eyes: Conjunctivae and EOM are normal. Pupils are equal, round, and reactive to light.  Neck: Normal range of motion. Neck supple. No JVD present. No tracheal deviation present. No thyromegaly present.  Cardiovascular: Normal rate and regular rhythm.   No murmur  heard. Pulmonary/Chest: Effort normal and breath sounds normal. She has no wheezes. She exhibits no tenderness.  Abdominal: Soft. Bowel sounds are normal.  Musculoskeletal: Normal range of motion. She exhibits no edema and no tenderness.  Lymphadenopathy:    She has no cervical adenopathy.  Neurological: She is alert and oriented to person, place, and time. She has normal reflexes. No cranial nerve deficit.  Skin: Skin is warm and dry. She is not diaphoretic.  Psychiatric: She has a normal mood and affect. Her behavior is normal.          Assessment & Plan:  seasonal allergies rhinitis severe Homeopathic medications help  Consider accupenture  Refill  valtrex Discussed calcium Use of suppliment

## 2013-12-07 NOTE — Progress Notes (Signed)
Pre visit review using our clinic review tool, if applicable. No additional management support is needed unless otherwise documented below in the visit note. 

## 2013-12-07 NOTE — Patient Instructions (Signed)
[employer] for acupuncture and accupressure

## 2014-01-02 ENCOUNTER — Other Ambulatory Visit: Payer: Self-pay

## 2014-01-02 DIAGNOSIS — Z1231 Encounter for screening mammogram for malignant neoplasm of breast: Secondary | ICD-10-CM

## 2014-01-11 ENCOUNTER — Ambulatory Visit (INDEPENDENT_AMBULATORY_CARE_PROVIDER_SITE_OTHER): Payer: Medicare Other

## 2014-01-11 VITALS — BP 109/65 | HR 71 | Resp 16 | Ht 69.0 in | Wt 140.0 lb

## 2014-01-11 DIAGNOSIS — Q828 Other specified congenital malformations of skin: Secondary | ICD-10-CM | POA: Diagnosis not present

## 2014-01-11 DIAGNOSIS — B070 Plantar wart: Secondary | ICD-10-CM | POA: Diagnosis not present

## 2014-01-11 DIAGNOSIS — M2010 Hallux valgus (acquired), unspecified foot: Secondary | ICD-10-CM | POA: Diagnosis not present

## 2014-01-11 NOTE — Patient Instructions (Signed)

## 2014-01-11 NOTE — Progress Notes (Signed)
   Subjective:    Patient ID: Cindy Schwartz, female    DOB: [birth year], 80 y.o.   MRN: [id number]  HPI Comments: N callouses L B/L 2nd MPJ plantar forefoot D about 1 year O C hard cores A pressure and walking T hx of home pedicure years ago, good shoes     Review of Systems  All other systems reviewed and are negative.      Objective:   Physical Exam Neurovascular status is intact with pedal pulses palpable DP and PT posterior were for Refill time 3 seconds all digits epicritic and proprioceptive sensations intact and symmetric bilateral normal plantar response DTRs dermatologic the skin color pigment normal hair growth absent nails normal trophic there punctate keratotic lesions cluster 5 subsecond right and 45 subsecond left consistent with Interpore keratosis or what appeared be verruca-type lesion plantar bilateral forefoot. 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 No other orthopedic biomechanical falls or problems are noted no open wounds ulcerations no secondary infection is noted       Assessment & Plan:  Assessment this time as verruca plantaris for support keratoses plantar foot bilateral at this time lesions are debrided pack to 17% salicylic acid under occlusion for 24 hours the patient is also dispensed a topical instruction sheet for topical salicylic acid application and occlusion utilizing duct tape. Follow these treatments for treatment and prevention a recurrent lesions return at future and as-needed basis for followup if needed advised treatment of warts or calluses or corns maybe noncovered service the future. Evaluation. Out today with recommendations for self treatment in the future.  Harriet Masson DPM

## 2014-01-24 ENCOUNTER — Encounter (INDEPENDENT_AMBULATORY_CARE_PROVIDER_SITE_OTHER): Payer: Self-pay

## 2014-01-24 ENCOUNTER — Ambulatory Visit
Admission: RE | Admit: 2014-01-24 | Discharge: 2014-01-24 | Disposition: A | Discharge status: Home / Self Care | Payer: Medicare Other | Source: Ambulatory Visit

## 2014-01-24 DIAGNOSIS — Z1231 Encounter for screening mammogram for malignant neoplasm of breast: Secondary | ICD-10-CM

## 2014-02-02 ENCOUNTER — Other Ambulatory Visit: Payer: Self-pay | Admitting: Otolaryngology

## 2014-02-02 DIAGNOSIS — J322 Chronic ethmoidal sinusitis: Secondary | ICD-10-CM | POA: Diagnosis not present

## 2014-02-02 DIAGNOSIS — J329 Chronic sinusitis, unspecified: Secondary | ICD-10-CM

## 2014-02-03 DIAGNOSIS — Z79899 Other long term (current) drug therapy: Secondary | ICD-10-CM | POA: Diagnosis not present

## 2014-02-03 DIAGNOSIS — K219 Gastro-esophageal reflux disease without esophagitis: Secondary | ICD-10-CM | POA: Diagnosis not present

## 2014-02-09 ENCOUNTER — Ambulatory Visit
Admission: RE | Admit: 2014-02-09 | Discharge: 2014-02-09 | Disposition: A | Discharge status: Home / Self Care | Payer: Medicare Other | Source: Ambulatory Visit | Attending: Otolaryngology | Admitting: Otolaryngology

## 2014-02-09 DIAGNOSIS — J329 Chronic sinusitis, unspecified: Secondary | ICD-10-CM

## 2014-02-10 DIAGNOSIS — J310 Chronic rhinitis: Secondary | ICD-10-CM | POA: Diagnosis not present

## 2014-03-23 ENCOUNTER — Telehealth: Payer: Self-pay | Admitting: Internal Medicine

## 2014-03-23 NOTE — Telephone Encounter (Signed)
lmovm to c/b to be establish with [facility] in Sept

## 2014-05-09 ENCOUNTER — Telehealth: Payer: Self-pay | Admitting: Internal Medicine

## 2014-05-09 NOTE — Telephone Encounter (Signed)
Pt would like to know if dr. Raliegh Ip would accept her as a transfer pt from dr. Arnoldo Morale. Pt states dr. Raliegh Ip has seen her several times.

## 2014-05-09 NOTE — Telephone Encounter (Signed)
ok 

## 2014-05-10 NOTE — Telephone Encounter (Signed)
Unable to leave a msg for pt, vm not set up

## 2014-05-10 NOTE — Telephone Encounter (Signed)
Pt has been scheduled with Dr. K

## 2014-05-11 DIAGNOSIS — K219 Gastro-esophageal reflux disease without esophagitis: Secondary | ICD-10-CM | POA: Diagnosis not present

## 2014-05-11 DIAGNOSIS — R198 Other specified symptoms and signs involving the digestive system and abdomen: Secondary | ICD-10-CM | POA: Diagnosis not present

## 2014-06-01 NOTE — Telephone Encounter (Signed)
Pt scheduled  

## 2014-06-14 ENCOUNTER — Ambulatory Visit: Payer: Medicare Other | Admitting: Internal Medicine

## 2014-06-15 DIAGNOSIS — N644 Mastodynia: Secondary | ICD-10-CM | POA: Diagnosis not present

## 2014-06-15 DIAGNOSIS — N72 Inflammatory disease of cervix uteri: Secondary | ICD-10-CM | POA: Diagnosis not present

## 2014-06-15 DIAGNOSIS — R350 Frequency of micturition: Secondary | ICD-10-CM | POA: Diagnosis not present

## 2014-06-15 DIAGNOSIS — N7689 Other specified inflammation of vagina and vulva: Secondary | ICD-10-CM | POA: Diagnosis not present

## 2014-06-15 DIAGNOSIS — M810 Age-related osteoporosis without current pathological fracture: Secondary | ICD-10-CM | POA: Diagnosis not present

## 2014-06-22 ENCOUNTER — Ambulatory Visit (INDEPENDENT_AMBULATORY_CARE_PROVIDER_SITE_OTHER): Payer: Medicare Other | Admitting: Internal Medicine

## 2014-06-22 ENCOUNTER — Encounter: Payer: Self-pay | Admitting: Internal Medicine

## 2014-06-22 VITALS — BP 120/78 | HR 67 | Temp 98.0°F | Resp 18 | Ht 69.0 in | Wt 145.0 lb

## 2014-06-22 DIAGNOSIS — I712 Thoracic aortic aneurysm, without rupture, unspecified: Secondary | ICD-10-CM

## 2014-06-22 DIAGNOSIS — M5030 Other cervical disc degeneration, unspecified cervical region: Secondary | ICD-10-CM | POA: Diagnosis not present

## 2014-06-22 DIAGNOSIS — M810 Age-related osteoporosis without current pathological fracture: Secondary | ICD-10-CM | POA: Diagnosis not present

## 2014-06-22 DIAGNOSIS — J3089 Other allergic rhinitis: Secondary | ICD-10-CM

## 2014-06-22 DIAGNOSIS — Z8619 Personal history of other infectious and parasitic diseases: Secondary | ICD-10-CM | POA: Diagnosis not present

## 2014-06-22 DIAGNOSIS — Z23 Encounter for immunization: Secondary | ICD-10-CM

## 2014-06-22 NOTE — Patient Instructions (Signed)
Limit your sodium (Salt) intake  Take a calcium supplement, plus 800-1200 units of vitamin D    It is important that you exercise regularly, at least 20 minutes 3 to 4 times per week.  If you develop chest pain or shortness of breath seek  medical attention.  Return in 6 months for follow-up 

## 2014-06-22 NOTE — Progress Notes (Signed)
Pre visit review using our clinic review tool, if applicable. No additional management support is needed unless otherwise documented below in the visit note. 

## 2014-06-22 NOTE — Progress Notes (Signed)
Subjective:    Patient ID: Cindy Schwartz, female    DOB: 1933/12/08, 80 y.o.   MRN: [id number]  HPI  80 year old patient who is seen today to establish with my practice. She enjoys excellent health.  She does have a history of an aneurysm of the descending thoracic aorta which has been stable.  She has been receiving imaging studies on a two-year basis and this has been quite stable.  She has a history of osteoporosis and allergic rhinitis. She continues to have left-sided rhinorrhea.  She has had any ENT evaluation, including imaging studies She has gastroesophageal reflux disease, and remains on chronic PPI therapy. No major concerns or complaints  Past Medical History  Diagnosis Date  . Osteoporosis   . Aneurysm of ascending aorta   . History of ovarian cancer   . Raynaud's disease   . GERD (gastroesophageal reflux disease)   . Insomnia     History   Social History  . Marital Status: Married    Spouse Name: N/A    Number of Children: N/A  . Years of Education: N/A   Occupational History  . retired    Social History Main Topics  . Smoking status: Never Smoker   . Smokeless tobacco: Never Used  . Alcohol Use: No  . Drug Use: No  . Sexual Activity: Not on file   Other Topics Concern  . Not on file   Social History Narrative  . No narrative on file    Past Surgical History  Procedure Laterality Date  . Ovarian ca s/p taj/bso  1979  . Abdominal hysterectomy    . Appendectomy    . Bilateral salpingoophorectomy      No family history on file.  No Known Allergies  Current Outpatient Prescriptions on File Prior to Visit  Medication Sig Dispense Refill  . calcium-vitamin D 250-100 MG-UNIT per tablet Take 1 tablet by mouth daily.        . cetirizine (ZYRTEC) 10 MG tablet Take 10 mg by mouth daily as needed.       . Cholecalciferol (VITAMIN D) 1000 UNITS capsule Take 1,000 Units by mouth daily.        Marland Kitchen esomeprazole (NEXIUM) 40 MG capsule TAKE 1 CAPSULE DAILY  BEFORE BREAKFAST  90 capsule  2  . fluticasone (FLONASE) 50 MCG/ACT nasal spray Place 2 sprays into both nostrils daily.  16 g  6  . valACYclovir (VALTREX) 500 MG tablet Take 1 tablet (500 mg total) by mouth daily as needed (PT REQUEST NAME BRAND-NO GENERICS).  30 tablet  2   No current facility-administered medications on file prior to visit.    BP 120/78  Pulse 67  Temp(Src) 98 F (36.7 C) (Oral)  Resp 18  Ht 5\' 9"  (1.753 m)  Wt 145 lb (65.772 kg)  BMI 21.40 kg/m2  SpO2 98%     Review of Systems  Constitutional: Negative.   HENT: Positive for rhinorrhea. Negative for congestion, dental problem, hearing loss, sinus pressure, sore throat and tinnitus.   Eyes: Negative for pain, discharge and visual disturbance.  Respiratory: Negative for cough and shortness of breath.   Cardiovascular: Negative for chest pain, palpitations and leg swelling.  Gastrointestinal: Negative for nausea, vomiting, abdominal pain, diarrhea, constipation, blood in stool and abdominal distention.  Genitourinary: Negative for dysuria, urgency, frequency, hematuria, flank pain, vaginal bleeding, vaginal discharge, difficulty urinating, vaginal pain and pelvic pain.  Musculoskeletal: Negative for arthralgias, gait problem and joint swelling.  Skin: Negative for  rash.  Neurological: Negative for dizziness, syncope, speech difficulty, weakness, numbness and headaches.  Hematological: Negative for adenopathy.  Psychiatric/Behavioral: Negative for behavioral problems, dysphoric mood and agitation. The patient is not nervous/anxious.        Objective:   Physical Exam  Constitutional: She is oriented to person, place, and time. She appears well-developed and well-nourished.  HENT:  Head: Normocephalic.  Right Ear: External ear normal.  Left Ear: External ear normal.  Mouth/Throat: Oropharynx is clear and moist.  Eyes: Conjunctivae and EOM are normal. Pupils are equal, round, and reactive to light.  Neck:  Normal range of motion. Neck supple. No thyromegaly present.  Cardiovascular: Normal rate, regular rhythm and normal heart sounds.   Right dorsalis pedis pulse only palpable  Pulmonary/Chest: Effort normal and breath sounds normal.  Abdominal: Soft. Bowel sounds are normal. She exhibits no mass. There is no tenderness.  Musculoskeletal: Normal range of motion.  Lymphadenopathy:    She has no cervical adenopathy.  Neurological: She is alert and oriented to person, place, and time.  Skin: Skin is warm and dry. No rash noted.  Psychiatric: She has a normal mood and affect. Her behavior is normal.          Assessment & Plan:   Osteoarthritis stable.  Allergic rhinitis History of thoracic aortic aneurysm Osteoporosis  Medicines updated CPX 6 months  In view of her vascular disease.  Will discuss statin therapy at her next visit

## 2014-08-14 DIAGNOSIS — M810 Age-related osteoporosis without current pathological fracture: Secondary | ICD-10-CM | POA: Diagnosis not present

## 2014-08-14 DIAGNOSIS — K219 Gastro-esophageal reflux disease without esophagitis: Secondary | ICD-10-CM | POA: Diagnosis not present

## 2014-08-14 DIAGNOSIS — R140 Abdominal distension (gaseous): Secondary | ICD-10-CM | POA: Diagnosis not present

## 2014-08-23 DIAGNOSIS — D239 Other benign neoplasm of skin, unspecified: Secondary | ICD-10-CM | POA: Diagnosis not present

## 2014-08-23 DIAGNOSIS — L821 Other seborrheic keratosis: Secondary | ICD-10-CM | POA: Diagnosis not present

## 2014-08-23 DIAGNOSIS — Z411 Encounter for cosmetic surgery: Secondary | ICD-10-CM | POA: Diagnosis not present

## 2014-08-23 DIAGNOSIS — Z86018 Personal history of other benign neoplasm: Secondary | ICD-10-CM | POA: Diagnosis not present

## 2014-08-23 DIAGNOSIS — D2371 Other benign neoplasm of skin of right lower limb, including hip: Secondary | ICD-10-CM | POA: Diagnosis not present

## 2014-08-31 DIAGNOSIS — M810 Age-related osteoporosis without current pathological fracture: Secondary | ICD-10-CM | POA: Diagnosis not present

## 2014-09-02 ENCOUNTER — Encounter: Payer: Self-pay | Admitting: Podiatry

## 2014-09-02 ENCOUNTER — Ambulatory Visit (INDEPENDENT_AMBULATORY_CARE_PROVIDER_SITE_OTHER): Payer: Medicare Other | Admitting: Podiatry

## 2014-09-02 VITALS — BP 130/67 | HR 77 | Resp 16

## 2014-09-02 DIAGNOSIS — Q828 Other specified congenital malformations of skin: Secondary | ICD-10-CM | POA: Diagnosis not present

## 2014-09-02 DIAGNOSIS — L600 Ingrowing nail: Secondary | ICD-10-CM | POA: Diagnosis not present

## 2014-09-04 NOTE — Progress Notes (Signed)
Patient ID: Cindy Schwartz, female   DOB: 08/11/1934, 80 y.o.   MRN: [id number]  Subjective: 80 year old female presents the office today with complaints of painful lesions on the bottom of both of her feet and the ball. She states that she previously been treated for calluses/warts. She did not perform the treatment which was discussed with her last appointment she states the lesions were feeling good and she was having no discomfort. Over the last couple weeks she started to have recurrence of the pain. Patient also has secondary complaints of possible ingrown toenail along the medial border of the left hallux nail. She said that she periodically trims the nail herself and this time the nail border is not painful however intermittently there is discomfort. She denies any drainage or redness from around the nail site. No other complaints at this time.  Objective: AAO x3, NAD DP/PT pulses palpable bilaterally, CRT less than 3 seconds Protective sensation intact with Simms Weinstein monofilament, vibratory sensation intact, Achilles tendon reflex intact Bilateral submetatarsal 2 hyperkeratotic punctate lesions with tenderness directly overlying hyperkeratotic lesions. Upon debridement no open lesions. There is no clinical signs of infection. There is no pinpoint bleeding, and no evidence of verruca.  There is atrophy the plantar fat pad with prominent metatarsal heads plantarly. Left medial hallux nail border with evidence of slight incurvation however the nails been cut proximal within the nail border. There is no swelling erythema or drainage. Currently, there is no discomfort overlying the nail border.  No open lesions or pre-ulcerative lesions. MMT 5/5, ROM WNL  No pain with calf compression, swelling, warmth, erythema.  Assessment: 80 year old female with bilateral submetatarsal 2 porokeratosis, left hallux medial nail border ingrown toenail.   Plan: -Treatment options were discussed including  alternatives, risks, complications. -Bilateral submetatarsal 2 hyperkeratotic lesion sharply debrided without complications to patient comfort. At this time, there is no evidence of verruca. Dispensed metatarsal pads to help offload the area. -Currently this time there is no discomfort or signs of infection along the left hallux nail border. Continue to watch the area. Discussed if there is recurrence symptoms may need to perform a partial nail avulsion.

## 2014-10-03 DIAGNOSIS — Q828 Other specified congenital malformations of skin: Secondary | ICD-10-CM

## 2014-10-12 ENCOUNTER — Other Ambulatory Visit: Payer: Self-pay | Admitting: *Deleted

## 2014-10-12 MED ORDER — ESOMEPRAZOLE MAGNESIUM 40 MG PO CPDR: DELAYED_RELEASE_CAPSULE | ORAL | Status: DC

## 2014-10-16 ENCOUNTER — Emergency Department (HOSPITAL_COMMUNITY): Payer: Medicare Other

## 2014-10-16 ENCOUNTER — Encounter: Payer: Self-pay | Admitting: Internal Medicine

## 2014-10-16 ENCOUNTER — Encounter (HOSPITAL_COMMUNITY): Payer: Self-pay | Admitting: Emergency Medicine

## 2014-10-16 ENCOUNTER — Inpatient Hospital Stay (HOSPITAL_COMMUNITY)
Admission: EM | Admit: 2014-10-16 | Discharge: 2014-10-17 | DRG: 287 | Disposition: A | Discharge status: Home / Self Care | Payer: Medicare Other | Attending: Cardiology | Admitting: Cardiology

## 2014-10-16 ENCOUNTER — Ambulatory Visit (INDEPENDENT_AMBULATORY_CARE_PROVIDER_SITE_OTHER): Payer: Medicare Other | Admitting: Internal Medicine

## 2014-10-16 VITALS — BP 120/78 | HR 67 | Temp 97.8°F | Resp 18 | Ht 69.0 in | Wt 145.0 lb

## 2014-10-16 DIAGNOSIS — G4700 Insomnia, unspecified: Secondary | ICD-10-CM | POA: Diagnosis not present

## 2014-10-16 DIAGNOSIS — M810 Age-related osteoporosis without current pathological fracture: Secondary | ICD-10-CM | POA: Diagnosis not present

## 2014-10-16 DIAGNOSIS — K219 Gastro-esophageal reflux disease without esophagitis: Secondary | ICD-10-CM | POA: Diagnosis not present

## 2014-10-16 DIAGNOSIS — R0789 Other chest pain: Secondary | ICD-10-CM | POA: Diagnosis not present

## 2014-10-16 DIAGNOSIS — R030 Elevated blood-pressure reading, without diagnosis of hypertension: Secondary | ICD-10-CM | POA: Diagnosis not present

## 2014-10-16 DIAGNOSIS — Z79899 Other long term (current) drug therapy: Secondary | ICD-10-CM

## 2014-10-16 DIAGNOSIS — I700 Atherosclerosis of aorta: Secondary | ICD-10-CM | POA: Diagnosis not present

## 2014-10-16 DIAGNOSIS — R0602 Shortness of breath: Secondary | ICD-10-CM

## 2014-10-16 DIAGNOSIS — I209 Angina pectoris, unspecified: Secondary | ICD-10-CM | POA: Diagnosis not present

## 2014-10-16 DIAGNOSIS — F419 Anxiety disorder, unspecified: Secondary | ICD-10-CM | POA: Diagnosis not present

## 2014-10-16 DIAGNOSIS — I712 Thoracic aortic aneurysm, without rupture, unspecified: Secondary | ICD-10-CM | POA: Diagnosis present

## 2014-10-16 DIAGNOSIS — I7300 Raynaud's syndrome without gangrene: Secondary | ICD-10-CM | POA: Diagnosis present

## 2014-10-16 DIAGNOSIS — I200 Unstable angina: Secondary | ICD-10-CM

## 2014-10-16 DIAGNOSIS — Z8543 Personal history of malignant neoplasm of ovary: Secondary | ICD-10-CM

## 2014-10-16 DIAGNOSIS — R079 Chest pain, unspecified: Secondary | ICD-10-CM | POA: Diagnosis present

## 2014-10-16 DIAGNOSIS — IMO0001 Reserved for inherently not codable concepts without codable children: Secondary | ICD-10-CM

## 2014-10-16 DIAGNOSIS — Z0389 Encounter for observation for other suspected diseases and conditions ruled out: Secondary | ICD-10-CM

## 2014-10-16 HISTORY — DX: Encounter for observation for other suspected diseases and conditions ruled out: Z03.89

## 2014-10-16 HISTORY — DX: Elevated blood-pressure reading, without diagnosis of hypertension: R03.0

## 2014-10-16 HISTORY — DX: Reserved for inherently not codable concepts without codable children: IMO0001

## 2014-10-16 LAB — CBC
HEMATOCRIT: 41.8 % (ref 36.0–46.0)
Hemoglobin: 14 g/dL (ref 12.0–15.0)
MCH: 28.8 pg (ref 26.0–34.0)
MCHC: 33.5 g/dL (ref 30.0–36.0)
MCV: 86 fL (ref 78.0–100.0)
PLATELETS: 205 10*3/uL (ref 150–400)
RBC: 4.86 MIL/uL (ref 3.87–5.11)
RDW: 14.3 % (ref 11.5–15.5)
WBC: 7.2 10*3/uL (ref 4.0–10.5)

## 2014-10-16 LAB — BASIC METABOLIC PANEL
Anion gap: 7 (ref 5–15)
BUN: 13 mg/dL (ref 6–23)
CO2: 28 mmol/L (ref 19–32)
CREATININE: 0.58 mg/dL (ref 0.50–1.10)
Calcium: 10 mg/dL (ref 8.4–10.5)
Chloride: 100 mmol/L (ref 96–112)
GFR calc Af Amer: >90 mL/min (ref >90)
GFR calc non Af Amer: 86 mL/min — ABNORMAL LOW (ref >90)
GLUCOSE: 99 mg/dL (ref 70–99)
POTASSIUM: 4.3 mmol/L (ref 3.5–5.1)
Sodium: 135 mmol/L (ref 135–145)

## 2014-10-16 LAB — PROTIME-INR
INR: 1.01 (ref 0.00–1.49)
Prothrombin Time: 13.4 seconds (ref 11.6–15.2)

## 2014-10-16 LAB — I-STAT TROPONIN, ED: TROPONIN I, POC: 0 ng/mL (ref 0.00–0.08)

## 2014-10-16 LAB — TROPONIN I

## 2014-10-16 LAB — TSH: TSH: 3.575 u[IU]/mL (ref 0.350–4.500)

## 2014-10-16 MED ORDER — SODIUM CHLORIDE 0.9 % IJ SOLN
3.0000 mL | Freq: Two times a day (BID) | INTRAMUSCULAR | Status: DC
  Administered 2014-10-16 – 2014-10-17 (×2): 3 mL via INTRAVENOUS

## 2014-10-16 MED ORDER — ASPIRIN EC 81 MG PO TBEC: 81.0000 mg | DELAYED_RELEASE_TABLET | Freq: Every day | ORAL | Status: DC

## 2014-10-16 MED ORDER — HEPARIN (PORCINE) IN NACL 100-0.45 UNIT/ML-% IJ SOLN
800.0000 [IU]/h | INTRAMUSCULAR | Status: DC
  Administered 2014-10-16: 800 [IU]/h via INTRAVENOUS
  Filled 2014-10-16 (×2): qty 250

## 2014-10-16 MED ORDER — ONDANSETRON HCL 4 MG/2ML IJ SOLN: 4.0000 mg | Freq: Four times a day (QID) | INTRAMUSCULAR | Status: DC | PRN

## 2014-10-16 MED ORDER — SODIUM CHLORIDE 0.9 % IJ SOLN: 3.0000 mL | INTRAMUSCULAR | Status: DC | PRN

## 2014-10-16 MED ORDER — ALPRAZOLAM 0.25 MG PO TABS: 0.2500 mg | ORAL_TABLET | Freq: Three times a day (TID) | ORAL | Status: DC | PRN

## 2014-10-16 MED ORDER — CARVEDILOL 3.125 MG PO TABS
3.1250 mg | ORAL_TABLET | Freq: Two times a day (BID) | ORAL | Status: DC
  Administered 2014-10-17 (×2): 3.125 mg via ORAL
  Filled 2014-10-16 (×3): qty 1

## 2014-10-16 MED ORDER — NITROGLYCERIN 2 % TD OINT
1.0000 [in_us] | TOPICAL_OINTMENT | Freq: Once | TRANSDERMAL | Status: AC
  Administered 2014-10-16: 1 [in_us] via TOPICAL
  Filled 2014-10-16: qty 1

## 2014-10-16 MED ORDER — ATORVASTATIN CALCIUM 20 MG PO TABS
20.0000 mg | ORAL_TABLET | Freq: Every day | ORAL | Status: DC
  Filled 2014-10-16: qty 1

## 2014-10-16 MED ORDER — SODIUM CHLORIDE 0.9 % IV SOLN: 250.0000 mL | INTRAVENOUS | Status: DC | PRN

## 2014-10-16 MED ORDER — ZOLPIDEM TARTRATE 5 MG PO TABS: 5.0000 mg | ORAL_TABLET | Freq: Every evening | ORAL | Status: DC | PRN

## 2014-10-16 MED ORDER — ASPIRIN 81 MG PO CHEW
324.0000 mg | CHEWABLE_TABLET | ORAL | Status: AC
  Administered 2014-10-17: 324 mg via ORAL
  Filled 2014-10-16: qty 4

## 2014-10-16 MED ORDER — SODIUM CHLORIDE 0.9 % IV SOLN
1.0000 mL/kg/h | INTRAVENOUS | Status: DC
  Administered 2014-10-17: 1 mL/kg/h via INTRAVENOUS

## 2014-10-16 MED ORDER — NITROGLYCERIN 2 % TD OINT
1.0000 [in_us] | TOPICAL_OINTMENT | Freq: Three times a day (TID) | TRANSDERMAL | Status: DC
  Filled 2014-10-16: qty 30

## 2014-10-16 MED ORDER — ACETAMINOPHEN 325 MG PO TABS: 650.0000 mg | ORAL_TABLET | ORAL | Status: DC | PRN

## 2014-10-16 MED ORDER — HEPARIN BOLUS VIA INFUSION
4000.0000 [IU] | Freq: Once | INTRAVENOUS | Status: AC
  Administered 2014-10-16: 4000 [IU] via INTRAVENOUS
  Filled 2014-10-16: qty 4000

## 2014-10-16 MED ORDER — SODIUM CHLORIDE 0.9 % IV SOLN
250.0000 mL | INTRAVENOUS | Status: DC | PRN
  Administered 2014-10-16: 250 mL via INTRAVENOUS

## 2014-10-16 MED ORDER — CARVEDILOL 3.125 MG PO TABS: 3.1250 mg | ORAL_TABLET | Freq: Two times a day (BID) | ORAL | Status: DC

## 2014-10-16 MED ORDER — NITROGLYCERIN 0.4 MG SL SUBL: 0.4000 mg | SUBLINGUAL_TABLET | SUBLINGUAL | Status: DC | PRN

## 2014-10-16 MED ORDER — ASPIRIN 81 MG PO CHEW
324.0000 mg | CHEWABLE_TABLET | Freq: Once | ORAL | Status: AC
  Administered 2014-10-16: 324 mg via ORAL
  Filled 2014-10-16: qty 4

## 2014-10-16 NOTE — ED Notes (Signed)
Pt asked for water Dr said ice chips instead but when I brought them to her sh delined offer.

## 2014-10-16 NOTE — Progress Notes (Signed)
Subjective:    Patient ID: Cindy Schwartz, female    DOB: 1933-12-26, 81 y.o.   MRN: [id number]  HPI  81 year old patient who has a history of a thoracic aortic aneurysm and also a history of bifascicular block.  She was stable until 3 days ago when she developed shortness of breath and chest tightness while shoveling snow.  Chest tightness lasted approximately 30 minutes.  She states that she generally felt unwell Saturday, but yesterday felt well until this morning.  She had recurrent chest heaviness and shortness of breath while vacuuming.  Throughout the day, she has had some vague chest discomfort and general sense of unwellness.  She continues to have mild shortness of breath with exertion. EKG reviewed and reveals bifascicular block and unchanged  Past Medical History  Diagnosis Date  . Osteoporosis   . Aneurysm of ascending aorta   . History of ovarian cancer   . Raynaud's disease   . GERD (gastroesophageal reflux disease)   . Insomnia     History   Social History  . Marital Status: Married    Spouse Name: N/A    Number of Children: N/A  . Years of Education: N/A   Occupational History  . retired    Social History Main Topics  . Smoking status: Never Smoker   . Smokeless tobacco: Never Used  . Alcohol Use: No  . Drug Use: No  . Sexual Activity: Not on file   Other Topics Concern  . Not on file   Social History Narrative    Past Surgical History  Procedure Laterality Date  . Ovarian ca s/p taj/bso  1979  . Abdominal hysterectomy    . Appendectomy    . Bilateral salpingoophorectomy      No family history on file.  No Known Allergies  Current Outpatient Prescriptions on File Prior to Visit  Medication Sig Dispense Refill  . cetirizine (ZYRTEC) 10 MG tablet Take 10 mg by mouth daily as needed.     Marland Kitchen esomeprazole (NEXIUM) 40 MG capsule TAKE 1 CAPSULE DAILY BEFORE BREAKFAST (Patient taking differently: 40 mg. Pt taking one capsule every 3 days) 90 capsule 3    . valACYclovir (VALTREX) 500 MG tablet Take 1 tablet (500 mg total) by mouth daily as needed (PT REQUEST NAME BRAND-NO GENERICS). 30 tablet 2   No current facility-administered medications on file prior to visit.    BP 120/78 mmHg  Pulse 67  Temp(Src) 97.8 F (36.6 C) (Oral)  Resp 18  Ht 5\' 9"  (1.753 m)  Wt 145 lb (65.772 kg)  BMI 21.40 kg/m2  SpO2 99%     Review of Systems  Constitutional: Positive for activity change and fatigue.  HENT: Negative for congestion, dental problem, hearing loss, rhinorrhea, sinus pressure, sore throat and tinnitus.   Eyes: Negative for pain, discharge and visual disturbance.  Respiratory: Positive for chest tightness and shortness of breath. Negative for cough.   Cardiovascular: Negative for chest pain, palpitations and leg swelling.  Gastrointestinal: Negative for nausea, vomiting, abdominal pain, diarrhea, constipation, blood in stool and abdominal distention.  Genitourinary: Negative for dysuria, urgency, frequency, hematuria, flank pain, vaginal bleeding, vaginal discharge, difficulty urinating, vaginal pain and pelvic pain.  Musculoskeletal: Negative for joint swelling, arthralgias and gait problem.  Skin: Negative for rash.  Neurological: Positive for weakness. Negative for dizziness, syncope, speech difficulty, numbness and headaches.  Hematological: Negative for adenopathy.  Psychiatric/Behavioral: Negative for behavioral problems, dysphoric mood and agitation. The patient is not nervous/anxious.  Objective:   Physical Exam  Constitutional: She is oriented to person, place, and time. She appears well-developed and well-nourished.  Blood pressure 120/76 right arm  HENT:  Head: Normocephalic.  Right Ear: External ear normal.  Left Ear: External ear normal.  Mouth/Throat: Oropharynx is clear and moist.  Eyes: Conjunctivae and EOM are normal. Pupils are equal, round, and reactive to light.  Neck: Normal range of motion. Neck  supple. No thyromegaly present.  Cardiovascular: Normal rate, regular rhythm and normal heart sounds.   Pedal pulses diminished  Pulmonary/Chest: Effort normal and breath sounds normal.  Abdominal: Soft. Bowel sounds are normal. She exhibits no mass. There is no tenderness.  Musculoskeletal: Normal range of motion.  Lymphadenopathy:    She has no cervical adenopathy.  Neurological: She is alert and oriented to person, place, and time.  Skin: Skin is warm and dry. No rash noted.  Psychiatric: She has a normal mood and affect. Her behavior is normal.          Assessment & Plan:   Probable acute coronary syndrome.  Discussed with patient and husband.  Agreeable to report to [facility] ED for initial evaluation, admission and cardiology evaluation Aneurysm of thoracic aorta Bifascicular block  Admit for workup of ACS

## 2014-10-16 NOTE — ED Provider Notes (Signed)
CSN: [id number]     Arrival date & time 10/16/14  1654 History   First MD Initiated Contact with Patient 10/16/14 1742     Chief Complaint  Patient presents with  . Chest Pain     (Consider location/radiation/quality/duration/timing/severity/associated sxs/prior Treatment) HPI Comments: Patient presents with chest pain. She states 2 days ago she started having some chest pressure and shortness of breath while she was shoveling some snow. She states yesterday she was feeling okay and then today she started having the chest pain and shortness breath again while she was doing some housework. She currently is pain-free. She went to her primary care physician and was sent over here for further evaluation. She denies any cough or chest congestion. She still has a little bit of shortness of breath but no chest discomfort. She denies any leg pain or swelling. She does have a history of a 4 cm aneurysm of the ascending aorta which has been stable on recent imaging studies. Her last MR of this was in 2014. She's scheduled to have another study this year. She's followed by Dr. [name]. She denies any known history of coronary artery disease. She denies any past stress test or catheterization.  Patient is a 81 y.o. female presenting with chest pain.  Chest Pain Associated symptoms: fatigue and shortness of breath   Associated symptoms: no abdominal pain, no back pain, no cough, no diaphoresis, no dizziness, no fever, no headache, no nausea, no numbness, not vomiting and no weakness     Past Medical History  Diagnosis Date  . Osteoporosis   . Aneurysm of ascending aorta   . History of ovarian cancer   . Raynaud's disease   . GERD (gastroesophageal reflux disease)   . Insomnia    Past Surgical History  Procedure Laterality Date  . Ovarian ca s/p taj/bso  1979  . Abdominal hysterectomy    . Appendectomy    . Bilateral salpingoophorectomy     No family history on file. History  Substance Use  Topics  . Smoking status: Never Smoker   . Smokeless tobacco: Never Used  . Alcohol Use: No   OB History    No data available     Review of Systems  Constitutional: Positive for fatigue. Negative for fever, chills and diaphoresis.  HENT: Negative for congestion, rhinorrhea and sneezing.   Eyes: Negative.   Respiratory: Positive for shortness of breath. Negative for cough and chest tightness.   Cardiovascular: Positive for chest pain. Negative for leg swelling.  Gastrointestinal: Negative for nausea, vomiting, abdominal pain, diarrhea and blood in stool.  Genitourinary: Negative for frequency, hematuria, flank pain and difficulty urinating.  Musculoskeletal: Negative for back pain and arthralgias.  Skin: Negative for rash.  Neurological: Negative for dizziness, speech difficulty, weakness, numbness and headaches.      Allergies  Review of patient's allergies indicates no known allergies.  Home Medications   Prior to Admission medications   Medication Sig Start Date End Date Taking? Authorizing Provider  esomeprazole (NEXIUM) 40 MG capsule TAKE 1 CAPSULE DAILY BEFORE BREAKFAST Patient taking differently: 40 mg. Pt taking one capsule every 3 days 10/12/14  Yes Marletta Lor, MD   BP 156/68 mmHg  Pulse 66  Temp(Src) 98 F (36.7 C) (Oral)  Resp 18  Ht 5\' 7"  (1.702 m)  Wt 131 lb 3.2 oz (59.512 kg)  BMI 20.54 kg/m2  SpO2 100% Physical Exam  Constitutional: She is oriented to person, place, and time. She appears well-developed and  well-nourished.  HENT:  Head: Normocephalic and atraumatic.  Eyes: Pupils are equal, round, and reactive to light.  Neck: Normal range of motion. Neck supple.  Cardiovascular: Normal rate, regular rhythm and normal heart sounds.   Pulmonary/Chest: Effort normal and breath sounds normal. No respiratory distress. She has no wheezes. She has no rales. She exhibits no tenderness.  Abdominal: Soft. Bowel sounds are normal. There is no tenderness.  There is no rebound and no guarding.  Musculoskeletal: Normal range of motion. She exhibits no edema.  No calf tenderness  Lymphadenopathy:    She has no cervical adenopathy.  Neurological: She is alert and oriented to person, place, and time.  Skin: Skin is warm and dry. No rash noted.  Psychiatric: She has a normal mood and affect.    ED Course  Procedures (including critical care time) Labs Review Labs Reviewed  BASIC METABOLIC PANEL - Abnormal; Notable for the following:    GFR calc non Af Amer 86 (*)    All other components within normal limits  CBC  TROPONIN I  PROTIME-INR  COMPREHENSIVE METABOLIC PANEL  TSH  TROPONIN I  TROPONIN I  LIPID PANEL  HEPARIN LEVEL (UNFRACTIONATED)  CBC  I-STAT TROPOININ, ED    Imaging Review Dg Chest 2 View  10/16/2014   CLINICAL DATA:  81 year old female with chest heaviness for the past 3 days.  EXAM: CHEST  2 VIEW  COMPARISON:  Chest x-ray 11/14/2008.  FINDINGS: Lung volumes are normal. No consolidative airspace disease. No pleural effusions. No pneumothorax. No pulmonary nodule or mass noted. Pulmonary vasculature and the cardiomediastinal silhouette are within normal limits. Atherosclerosis in the thoracic aorta.  IMPRESSION: 1.  No radiographic evidence of acute cardiopulmonary disease. 2. Atherosclerosis.   Electronically Signed   By: Vinnie Langton M.D.   On: 10/16/2014 18:31     EKG Interpretation   Date/Time:  Monday October 16 2014 17:04:44 EST Ventricular Rate:  68 PR Interval:  178 QRS Duration: 134 QT Interval:  452 QTC Calculation: 480 R Axis:   -124 Text Interpretation:  Normal sinus rhythm with sinus arrhythmia Right  bundle branch block Septal infarct , age undetermined Lateral infarct ,  age undetermined Abnormal ECG since last tracing no significant change  Confirmed by Gaytha Raybourn  MD, Porcia Morganti ([id number]) on 10/16/2014 7:18:25 PM      MDM   Final diagnoses:  Unstable angina    Patient presents with exertional  angina. Nitro paste was placed however then patient 1 unit removed. She was given aspirin. I consult is with Dr. Marlou Porch with cardiology and patient was admitted to the cardiology service.    Malvin Johns, MD 10/16/14 [phone]

## 2014-10-16 NOTE — Progress Notes (Signed)
Pre visit review using our clinic review tool, if applicable. No additional management support is needed unless otherwise documented below in the visit note. 

## 2014-10-16 NOTE — ED Notes (Signed)
Pt reports intermittent chest pressure since Saturday. Pt denies nv, sob. Skin warm and dry. Pt also reports head discomfort. Hx AAA.

## 2014-10-16 NOTE — Consult Note (Signed)
ANTICOAGULATION CONSULT NOTE - Initial Consult  Pharmacy Consult for Heparin Indication: chest pain/ACS  No Known Allergies  Patient Measurements: Height: 5\' 7"  (170.2 cm) Weight: 142 lb (64.411 kg) IBW/kg (Calculated) : 61.6 Heparin Dosing Weight: 64kg  Vital Signs: Temp: 97.9 F (36.6 C) (01/25 1700) Temp Source: Oral (01/25 1700) BP: 150/68 mmHg (01/25 2015) Pulse Rate: 66 (01/25 2015)  Labs:  Recent Labs  10/16/14 1703  HGB 14.0  HCT 41.8  PLT 205  CREATININE 0.58    Estimated Creatinine Clearance: 56.4 mL/min (by C-G formula based on Cr of 0.58).   Medical History: Past Medical History  Diagnosis Date  . Osteoporosis   . Aneurysm of ascending aorta   . History of ovarian cancer   . Raynaud's disease   . GERD (gastroesophageal reflux disease)   . Insomnia     Medications:  No anticoagulants pta  Assessment: Cindy Schwartz with no history of CAD presented to the ED with chest pain. It started 2 days ago while shoveling snow, resolved, and then returned today while she was vacuuming. Troponins negative thus far. She will begin IV heparin with plans for cath tomorrow.  Goal of Therapy:  Heparin level 0.3-0.7 units/ml Monitor platelets by anticoagulation protocol: Yes   Plan:  1) Heparin bolus 4000 units x 1 2) Heparin drip at 800 units/hr 3) Check 8 hour heparin level 4) Daily heparin level, CBC  [name] 10/16/2014,8:29 PM

## 2014-10-16 NOTE — H&P (Signed)
History and Physical   Patient ID: Cindy Schwartz MRN: [id number], DOB/AGE: 1933-11-02 81 y.o. Date of Encounter: 10/16/2014  Primary Physician: Nyoka Cowden, MD Primary Cardiologist: New  Chief Complaint:  Chest pain  HPI: Cindy Schwartz is a 81 y.o. female with no history of CAD. She has a history of a thoracic aortic aneurysm, last measured in 2014 at 4.2-4.3 cm, Raynaud's disease and GERD.   2 days ago, she was out shoveling snow when she had onset of substernal chest discomfort. She has problems wall finding it, but states that she just felt different and was uncomfortable. She was a little short of breath but denies nausea, vomiting or diaphoresis. It reached a 6/10. She came inside and rested and the symptoms resolved in less than 30 minutes. She did well over the next 48 hours.  Today, she was vacuuming and had similar symptoms. There were less severe, reaching a 4/10. She was concerned because of the recurrence of the discomfort, and went to see Dr. Burnice Logan. Her ECGs shows a bifascicular block which is unchanged. He was concerned and sent her to the emergency room for admission. Currently she is still having pain but at a very low level, 1/10.   She has never had this pain before. She is reluctant to take new medications, and does not want to take anything she does not need.   Past Medical History  Diagnosis Date  . Osteoporosis   . Aneurysm of ascending aorta   . History of ovarian cancer   . Raynaud's disease   . GERD (gastroesophageal reflux disease)   . Insomnia     Surgical History:  Past Surgical History  Procedure Laterality Date  . Ovarian ca s/p taj/bso  1979  . Abdominal hysterectomy    . Appendectomy    . Bilateral salpingoophorectomy       I have reviewed the patient's current medications. Medication Sig  cetirizine (ZYRTEC) 10 MG tablet Take 10 mg by mouth daily as needed.   esomeprazole (NEXIUM) 40 MG capsule TAKE 1 CAPSULE DAILY BEFORE  BREAKFAST Patient taking differently: 40 mg. Pt taking one capsule every 3 days  valACYclovir (VALTREX) 500 MG tablet Take 1 tablet (500 mg total) by mouth daily as needed (PT REQUEST NAME BRAND-NO GENERICS).   Scheduled Meds: Continuous Infusions: PRN Meds:.  Allergies: No Known Allergies  History   Social History  . Marital Status: Married    Spouse Name: N/A    Number of Children: N/A  . Years of Education: N/A   Occupational History  . retired    Social History Main Topics  . Smoking status: Never Smoker   . Smokeless tobacco: Never Used  . Alcohol Use: No  . Drug Use: No  . Sexual Activity: Not on file   Other Topics Concern  . Not on file   Social History Narrative   She and her husband are from the [place] area of [place].`    Family Status  Relation Status Death Age  . Mother Deceased   . Father Deceased     Review of Systems:   Full 14-point review of systems otherwise negative except as noted above.  Physical Exam: Blood pressure 140/81, pulse 64, temperature 97.9 F (36.6 C), temperature source Oral, resp. rate 16, height 5\' 7"  (1.702 m), weight 142 lb (64.411 kg), SpO2 99 %. General: Well developed, well nourished,female in no acute distress. Head: Normocephalic, atraumatic, sclera non-icteric, no xanthomas, nares are without discharge. Dentition:  Good Neck: No carotid bruits. JVD not elevated. No thyromegally Lungs: Good expansion bilaterally. without wheezes or rhonchi.  Heart: Regular rate and rhythm with S1 S2.  No S3 or S4.  No murmur, no rubs, or gallops appreciated. Abdomen: Soft, non-tender, non-distended with normoactive bowel sounds. No hepatomegaly. No rebound/guarding. No obvious abdominal masses. Msk:  Strength and tone appear normal for age. No joint deformities or effusions, no spine or costo-vertebral angle tenderness. Extremities: No clubbing or cyanosis. No edema.  Distal pedal pulses are 2+ in 4 extrem Neuro: Alert and oriented X 3.  Moves all extremities spontaneously. No focal deficits noted. Psych:  Responds to questions appropriately with a normal affect. Skin: No rashes or lesions noted  Labs:   Lab Results  Component Value Date   WBC 7.2 10/16/2014   HGB 14.0 10/16/2014   HCT 41.8 10/16/2014   MCV 86.0 10/16/2014   PLT 205 10/16/2014   No results for input(s): INR in the last 72 hours.   Recent Labs Lab 10/16/14 1703  NA 135  K 4.3  CL 100  CO2 28  BUN 13  CREATININE 0.58  CALCIUM 10.0  GLUCOSE 99    Recent Labs  10/16/14 1714  TROPIPOC 0.00   Lab Results  Component Value Date   CHOL 190 07/13/2013   HDL 74.80 07/13/2013   LDLCALC 110* 07/13/2013   TRIG 24.0 07/13/2013    Radiology/Studies: Dg Chest 2 View 10/16/2014   CLINICAL DATA:  81 year old female with chest heaviness for the past 3 days.  EXAM: CHEST  2 VIEW  COMPARISON:  Chest x-ray 11/14/2008.  FINDINGS: Lung volumes are normal. No consolidative airspace disease. No pleural effusions. No pneumothorax. No pulmonary nodule or mass noted. Pulmonary vasculature and the cardiomediastinal silhouette are within normal limits. Atherosclerosis in the thoracic aorta.  IMPRESSION: 1.  No radiographic evidence of acute cardiopulmonary disease. 2. Atherosclerosis.   Electronically Signed   By: Vinnie Langton M.D.   On: 10/16/2014 18:31     Cardiac Cath: Never done  Echo:  Study Conclusions - Left ventricle: The cavity size was normal. There was mild focal basal hypertrophy of the septum. Systolic function was normal. Wall motion was normal; there were no regional wall motion abnormalities. - Mitral valve: Mild regurgitation. - Atrial septum: No defect or patent foramen ovale was identified.  ECG: 10/16/2014 sinus rhythm, right bundle branch block (old)  ASSESSMENT AND PLAN:  Principal Problem:   Unstable angina - admit, rule out MI, use heparin and nitroglycerin paste. Change to IV nitroglycerin if her symptoms do not  resolve. Cardiac catheterization in a.m. The risks and benefits of a cardiac catheterization including, but not limited to, death, stroke, MI, kidney damage and bleeding were discussed with the patient who indicates understanding and agrees to proceed.   She had aspirin 324 mg today. We will add aspirin 81 mg daily, Coreg 3.125 mg twice a day and Lipitor 20 mg to her medication regimen.  Active Problems:   Aneurysm of thoracic aorta - last measured in 2014 at 4.2-4.3 cm, followed by Dr. [name]    Esophageal reflux - use PPI    Anxiety - when necessary meds  Signed, Cindy Ferries, PA-C 10/16/2014 8:09 PM Beeper [phone]    Personally seen and examined, agree with above, extensive review of medical data/history.  She is quite anxious but has fairly typical symptoms of angina with exertion, chest pressure while shoveling snow and while vacuuming that is relieved with rest. Based upon  this, advanced age as strongest cardiac risk factor, I propose cardiac catheterization. Risks and benefits including stroke, bleeding, heart attack all discussed. Discussed radial artery approach. She is willing to proceed.  Candee Furbish, MD

## 2014-10-16 NOTE — ED Notes (Signed)
Pt requesting that nitro be removed from left upper chest.  Explained to pt the reasoning behind the medication but pt still requests that it be removed.  Nitro removed at this time.

## 2014-10-16 NOTE — Patient Instructions (Signed)
Report to [facility] ED immediately for evaluation and admission

## 2014-10-16 NOTE — ED Notes (Signed)
Cardiology at bedside.

## 2014-10-17 ENCOUNTER — Encounter (HOSPITAL_COMMUNITY): Payer: Self-pay | Admitting: Physician Assistant

## 2014-10-17 ENCOUNTER — Ambulatory Visit: Payer: Medicare Other | Admitting: Internal Medicine

## 2014-10-17 ENCOUNTER — Encounter (HOSPITAL_COMMUNITY)
Admission: EM | Disposition: A | Discharge status: Home / Self Care | Payer: Self-pay | Source: Home / Self Care | Attending: Cardiology

## 2014-10-17 DIAGNOSIS — IMO0001 Reserved for inherently not codable concepts without codable children: Secondary | ICD-10-CM

## 2014-10-17 DIAGNOSIS — G4700 Insomnia, unspecified: Secondary | ICD-10-CM | POA: Diagnosis not present

## 2014-10-17 DIAGNOSIS — F419 Anxiety disorder, unspecified: Secondary | ICD-10-CM | POA: Diagnosis not present

## 2014-10-17 DIAGNOSIS — R079 Chest pain, unspecified: Secondary | ICD-10-CM

## 2014-10-17 DIAGNOSIS — R0789 Other chest pain: Secondary | ICD-10-CM | POA: Diagnosis not present

## 2014-10-17 DIAGNOSIS — Z79899 Other long term (current) drug therapy: Secondary | ICD-10-CM | POA: Diagnosis not present

## 2014-10-17 DIAGNOSIS — I7300 Raynaud's syndrome without gangrene: Secondary | ICD-10-CM | POA: Diagnosis not present

## 2014-10-17 DIAGNOSIS — Z0389 Encounter for observation for other suspected diseases and conditions ruled out: Secondary | ICD-10-CM

## 2014-10-17 DIAGNOSIS — M810 Age-related osteoporosis without current pathological fracture: Secondary | ICD-10-CM | POA: Diagnosis not present

## 2014-10-17 DIAGNOSIS — I712 Thoracic aortic aneurysm, without rupture: Secondary | ICD-10-CM

## 2014-10-17 DIAGNOSIS — K219 Gastro-esophageal reflux disease without esophagitis: Secondary | ICD-10-CM | POA: Diagnosis not present

## 2014-10-17 DIAGNOSIS — R072 Precordial pain: Secondary | ICD-10-CM | POA: Diagnosis not present

## 2014-10-17 HISTORY — PX: LEFT HEART CATHETERIZATION WITH CORONARY ANGIOGRAM: SHX5451

## 2014-10-17 LAB — HEPARIN LEVEL (UNFRACTIONATED): Heparin Unfractionated: 0.38 IU/mL (ref 0.30–0.70)

## 2014-10-17 LAB — COMPREHENSIVE METABOLIC PANEL
ALBUMIN: 3.4 g/dL — AB (ref 3.5–5.2)
ALT: 14 U/L (ref 0–35)
ANION GAP: 9 (ref 5–15)
AST: 20 U/L (ref 0–37)
Alkaline Phosphatase: 50 U/L (ref 39–117)
BILIRUBIN TOTAL: 0.7 mg/dL (ref 0.3–1.2)
BUN: 10 mg/dL (ref 6–23)
CALCIUM: 9 mg/dL (ref 8.4–10.5)
CO2: 23 mmol/L (ref 19–32)
CREATININE: 0.61 mg/dL (ref 0.50–1.10)
Chloride: 106 mmol/L (ref 96–112)
GFR calc Af Amer: >90 mL/min (ref >90)
GFR calc non Af Amer: 85 mL/min — ABNORMAL LOW (ref >90)
Glucose, Bld: 84 mg/dL (ref 70–99)
Potassium: 3.7 mmol/L (ref 3.5–5.1)
Sodium: 138 mmol/L (ref 135–145)
Total Protein: 6.4 g/dL (ref 6.0–8.3)

## 2014-10-17 LAB — CBC
HCT: 36.6 % (ref 36.0–46.0)
HCT: 39.1 % (ref 36.0–46.0)
HEMOGLOBIN: 13.2 g/dL (ref 12.0–15.0)
Hemoglobin: 12.2 g/dL (ref 12.0–15.0)
MCH: 28.4 pg (ref 26.0–34.0)
MCH: 28.4 pg (ref 26.0–34.0)
MCHC: 33.3 g/dL (ref 30.0–36.0)
MCHC: 33.8 g/dL (ref 30.0–36.0)
MCV: 85.3 fL (ref 78.0–100.0)
MCV: 84.1 fL (ref 78.0–100.0)
PLATELETS: 167 10*3/uL (ref 150–400)
Platelets: 156 10*3/uL (ref 150–400)
RBC: 4.29 MIL/uL (ref 3.87–5.11)
RBC: 4.65 MIL/uL (ref 3.87–5.11)
RDW: 14.3 % (ref 11.5–15.5)
RDW: 14.3 % (ref 11.5–15.5)
WBC: 4.9 10*3/uL (ref 4.0–10.5)
WBC: 4.8 10*3/uL (ref 4.0–10.5)

## 2014-10-17 LAB — TROPONIN I
Troponin I: <0.03 ng/mL (ref <0.031)
Troponin I: <0.03 ng/mL (ref <0.031)

## 2014-10-17 LAB — LIPID PANEL
Cholesterol: 175 mg/dL (ref 0–200)
HDL: 63 mg/dL (ref >39)
LDL Cholesterol: 107 mg/dL — ABNORMAL HIGH (ref 0–99)
Total CHOL/HDL Ratio: 2.8 RATIO
Triglycerides: 23 mg/dL (ref <150)
VLDL: 5 mg/dL (ref 0–40)

## 2014-10-17 LAB — CREATININE, SERUM
Creatinine, Ser: 0.58 mg/dL (ref 0.50–1.10)
GFR calc Af Amer: >90 mL/min (ref >90)
GFR calc non Af Amer: 86 mL/min — ABNORMAL LOW (ref >90)

## 2014-10-17 LAB — POCT ACTIVATED CLOTTING TIME: Activated Clotting Time: 128 seconds

## 2014-10-17 SURGERY — LEFT HEART CATHETERIZATION WITH CORONARY ANGIOGRAM

## 2014-10-17 MED ORDER — HEPARIN SODIUM (PORCINE) 5000 UNIT/ML IJ SOLN: 5000.0000 [IU] | Freq: Three times a day (TID) | INTRAMUSCULAR | Status: DC

## 2014-10-17 MED ORDER — CARVEDILOL 3.125 MG PO TABS: 3.1250 mg | ORAL_TABLET | Freq: Two times a day (BID) | ORAL | Status: DC

## 2014-10-17 MED ORDER — LIDOCAINE HCL (PF) 1 % IJ SOLN
INTRAMUSCULAR | Status: AC
  Filled 2014-10-17: qty 30

## 2014-10-17 MED ORDER — SODIUM CHLORIDE 0.9 % IV SOLN
INTRAVENOUS | Status: DC
Start: 2014-10-17 — End: 2014-10-17
  Administered 2014-10-17: 125 mL/h via INTRAVENOUS

## 2014-10-17 MED ORDER — FENTANYL CITRATE 0.05 MG/ML IJ SOLN
INTRAMUSCULAR | Status: AC
  Filled 2014-10-17: qty 2

## 2014-10-17 MED ORDER — ACETAMINOPHEN 325 MG PO TABS: 650.0000 mg | ORAL_TABLET | ORAL | Status: DC | PRN

## 2014-10-17 MED ORDER — ONDANSETRON HCL 4 MG/2ML IJ SOLN: 4.0000 mg | Freq: Four times a day (QID) | INTRAMUSCULAR | Status: DC | PRN

## 2014-10-17 MED ORDER — DIAZEPAM 5 MG PO TABS: 5.0000 mg | ORAL_TABLET | ORAL | Status: DC | PRN

## 2014-10-17 MED ORDER — HEPARIN (PORCINE) IN NACL 2-0.9 UNIT/ML-% IJ SOLN
INTRAMUSCULAR | Status: AC
  Filled 2014-10-17: qty 1500

## 2014-10-17 MED ORDER — MIDAZOLAM HCL 2 MG/2ML IJ SOLN
INTRAMUSCULAR | Status: AC
  Filled 2014-10-17: qty 2

## 2014-10-17 NOTE — CV Procedure (Addendum)
Cindy Schwartz is a 81 y.o. female    [id number]  [id number] LOCATION:  FACILITY: [facility]  PHYSICIAN: [name], MD, [facility] [birth year]   DATE OF PROCEDURE:  10/17/2014    CARDIAC CATHETERIZATION     HISTORY:    Cindy Schwartz is a 81 y.o. female who is originally from [place].  She has a history of a thoracic aortic aneurysm, Raynaud's disease, and GERD.  She was admitted to the hospital after developing exertionally precipitated substernal chest discomfort while shoveling snow.  She had similar symptomatology while vacuuming.  She was seen by Dr. Marlou Porch and definitive cardiac catheterization was recommended.   PROCEDURE: Left heart catheterization: coronary angiography, left ventriculography, supravalvular aortography.  The patient was brought to the [facility] cardiac catherization laboratory in the fasting state. She was premedicated with Versed 2 mg and fentanyl 25 g.  Her right groin was prepped and shaved in usual sterile fashion. Xylocaine 1% was used for local anesthesia. A 5 French sheath was inserted into the R femoral artery. Diagnostic catheterizatiion was done with 5 [place] FL5, FR4, and pigtail catheters. Left ventriculography was done with 25 cc Omnipaque contrast.  With the patient's history of a thoracic aortic aneurysm supravalvular aortography was performed.  Hemostasis was obtained by direct manual compression. The patient tolerated the procedure well.   HEMODYNAMICS:   Central Aorta: 145/77   Left Ventricle: 145/14  ANGIOGRAPHY:  Left main: Upward takeoff vessel that was long and angiographically normal and trifurcated into the LAD, ramus intermediate, and left circumflex coronary artery.  LAD: Angiographically normal and gave rise to several septal perforating arteries and one major bifurcating diagonal branch.  Ramus Intermediate: Angiographically normal moderate sized vessel.  Left circumflex: Angiographically normal vessel.  Right coronary artery:  Normal vessel which ended in the PLA-like branch.   Left ventriculography revealed normal LV systolic function with an ejection fraction of approximately 60%.  Supra valvular Aortagraphy revealed a dilated ascending aorta.  There was no evidence for dissection.  There was no evidence for aortic insufficiency.  IMPRESSION:  Normal LV function.  Normal coronary arteries.  Mild to moderate dilatation of the ascending aorta without evidence for dissection.   [name], MD, [facility] 10/17/2014 12:24 PM

## 2014-10-17 NOTE — Progress Notes (Addendum)
Site area: right  Groin a 5 french arterial sheath removed  Site Prior to Removal:  Level 0  Pressure Applied For 20 MINUTES    Minutes Beginning at 1230p  Manual:   Yes.    Patient Status During Pull:  stable  Post Pull Groin Site:  Level 0  Post Pull Instructions Given:  Yes.    Post Pull Pulses Present:  Yes.    Dressing Applied:  Yes.    Comments:  VS remain stable during sheath pull.  Pt denies any discomfort at this time.

## 2014-10-17 NOTE — Discharge Summary (Signed)
Discharge Summary   Patient ID: Cindy Schwartz MRN: [id number], DOB/AGE: 79-04-1934 79 y.o. Admit date: 10/16/2014 D/C date:     10/17/2014  Primary Care Provider: Nyoka Cowden, MD Primary Cardiologist: Dr. Marlou Porch  Primary Discharge Diagnoses:  1. Chest pain, noncardiac, with normal coronary arteries by cath 2. Thoracic aortic aneurysm 4.2-4.3cm in 2014 3. Raynaud's disease 4. GERD 5. Elevated BP with no history of HTN  Secondary Discharge Diagnoses:  1. Osteoporosis 2. Hx of ovarian CA 3. Insomnia  Hospital Course: Cindy Schwartz is a 81 y/o F with history of thoracic aortic aneurysm, last measured in 2014 at 4.2-4.3 cm, Raynaud's disease and GERD who presented to [facility] with complaints of chest pain. 2 days prior to admission she had onset of substernal chest discomfort while shoveling snow. She had problems describing it but reported it just felt different and was uncomfortable. She was a little short of breath but denied nausea, vomiting or diaphoresis. She came inside and rested and the symptoms resolved in less than 30 minutes. She did well over the next 48 hours. On the day of admissions he had similar symptoms while vacuuming. This was less severe than the prior episode. She saw her PCP who became concerned about the pain and sent her to the ER. EKG showed NSR with bifascicular block which was unchanged. VS showed elevated BP up to 171/109 which was unusual for her. She is reluctant to take new medications, and does not want to take anything she does not need. She was admitted for further evaluation. She was started on ASA, statin, BB. Troponins remained negative. Labs otherwise unremarkable (CMET, CBC). LDL 107. CXR nonacute. She underwent cath today for definitive evaluation which showed normal LV function, normal coronary arteries, mild to moderate dilatation of the ascending aorta without evidence for dissection. Dr. Claiborne Billings recommended to continue the Coreg but discontinue the aspirin.  Dr. Vivi Martens note indicated previously that it would be beneficial for her to be on a BB with her aneurysm. With her bifascicular block she was started on low dose only this admission. She tolerated it well without adverse event. She was not tachycardic, tachypnic or hypoxic. At this time will also defer further lipid monitoring to PCP. The patient feels well today. Dr. Claiborne Billings has seen and examined the patient today and feels she is stable for discharge.  I have left a message on our office's scheduling voicemail requesting a post-cath follow-up appointment, and our office will call the patient with this information. She may not necessarily need to follow with cardiology regularly after this visit. Also, per review of Dr. Vivi Martens note in 2014, he wanted to restudy her aneurysm in 11/2014 - I do not see an appointment in the computer. We asked her to call his office to determine when she should follow up there next.   Discharge Vitals: Blood pressure 129/77, pulse 62, temperature 98 F (36.7 C), temperature source Oral, resp. rate 20, height 5\' 7"  (1.702 m), weight 138 lb 1.6 oz (62.642 kg), SpO2 100 %.  Labs: Lab Results  Component Value Date   WBC 4.8 10/17/2014   HGB 13.2 10/17/2014   HCT 39.1 10/17/2014   MCV 84.1 10/17/2014   PLT 167 10/17/2014    Recent Labs Lab 10/17/14 0450 10/17/14 1352  NA 138  --   K 3.7  --   CL 106  --   CO2 23  --   BUN 10  --   CREATININE 0.61 0.58  CALCIUM 9.0  --  PROT 6.4  --   BILITOT 0.7  --   ALKPHOS 50  --   ALT 14  --   AST 20  --   GLUCOSE 84  --     Recent Labs  10/16/14 2157 10/17/14 0450 10/17/14 1006  TROPONINI <0.03 <0.03 <0.03   Lab Results  Component Value Date   CHOL 175 10/17/2014   HDL 63 10/17/2014   LDLCALC 107* 10/17/2014   TRIG 23 10/17/2014    Diagnostic Studies/Procedures   Dg Chest 2 View  10/16/2014   CLINICAL DATA:  81 year old female with chest heaviness for the past 3 days.  EXAM: CHEST  2 VIEW   COMPARISON:  Chest x-ray 11/14/2008.  FINDINGS: Lung volumes are normal. No consolidative airspace disease. No pleural effusions. No pneumothorax. No pulmonary nodule or mass noted. Pulmonary vasculature and the cardiomediastinal silhouette are within normal limits. Atherosclerosis in the thoracic aorta.  IMPRESSION: 1.  No radiographic evidence of acute cardiopulmonary disease. 2. Atherosclerosis.   Electronically Signed   By: Cindy Schwartz M.D.   On: 10/16/2014 18:31     Cath 10/17/14  CARDIAC CATHETERIZATION  HISTORY:   Cindy Schwartz is a 81 y.o. female who is originally from [place]. She has a history of a thoracic aortic aneurysm, Raynaud's disease, and GERD. She was admitted to the hospital after developing exertionally precipitated substernal chest discomfort while shoveling snow. She had similar symptomatology while vacuuming. She was seen by Dr. Marlou Porch and definitive cardiac catheterization was recommended.   PROCEDURE: Left heart catheterization: coronary angiography, left ventriculography, supravalvular aortography.  The patient was brought to the [facility] cardiac catherization laboratory in the fasting state. She was premedicated with Versed 2 mg and fentanyl 25 g. Her right groin was prepped and shaved in usual sterile fashion. Xylocaine 1% was used for local anesthesia. A 5 French sheath was inserted into the R femoral artery. Diagnostic catheterizatiion was done with 5 [place] FL5, FR4, and pigtail catheters. Left ventriculography was done with 25 cc Omnipaque contrast. With the patient's history of a thoracic aortic aneurysm supravalvular aortography was performed. Hemostasis was obtained by direct manual compression. The patient tolerated the procedure well.   HEMODYNAMICS:  Central Aorta: 145/77  Left Ventricle: 145/14  ANGIOGRAPHY:  Left main: Upward takeoff vessel that was long and angiographically normal and trifurcated into the LAD, ramus intermediate,  and left circumflex coronary artery.  LAD: Angiographically normal and gave rise to several septal perforating arteries and one major bifurcating diagonal branch.  Ramus Intermediate: Angiographically normal moderate sized vessel.  Left circumflex: Angiographically normal vessel.  Right coronary artery: Normal vessel which ended in the PLA-like branch.   Left ventriculography revealed normal LV systolic function with an ejection fraction of approximately 60%.  Supra valvular Aortagraphy revealed a dilated ascending aorta. There was no evidence for dissection. There was no evidence for aortic insufficiency.  IMPRESSION:  Normal LV function.  Normal coronary arteries.  Mild to moderate dilatation of the ascending aorta without evidence for dissection.   [name], MD, [facility] 10/17/2014 12:24 PM       Discharge Medications   Current Discharge Medication List    START taking these medications   Details  carvedilol (COREG) 3.125 MG tablet Take 1 tablet (3.125 mg total) by mouth 2 (two) times daily with a meal. Qty: 60 tablet, Refills: 2      CONTINUE these medications which have NOT CHANGED   Details  esomeprazole (NEXIUM) 40 MG capsule TAKE 1 CAPSULE  DAILY BEFORE BREAKFAST Qty: 90 capsule, Refills: 3        Disposition   The patient will be discharged in stable condition to home. Discharge Instructions    Diet - low sodium heart healthy    Complete by:  As directed      Increase activity slowly    Complete by:  As directed   No driving for 2 days. No lifting over 5 lbs for 1 week. No sexual activity for 1 week. Keep procedure site clean & dry. If you notice increased pain, swelling, bleeding or pus, call/return!  You may shower, but no soaking baths/hot tubs/pools for 1 week.          Follow-up Information    Follow up with Candee Furbish, MD.   Specialty:  Cardiology   Why:  Office will call you for your followup appointment. Call office if you have  not heard back in 3 days.   Contact information:   [address] [place] [address] [phone]       Follow up with Gaye Pollack, MD.   Specialty:  Cardiothoracic Surgery   Why:  Please call Dr. Vivi Martens office to find out what the plan is for follow-up of your aneurysm. It was mild-moderately dilated by cath. His last note indicated that he wanted to see you in 11/2014 but we did not see this in the computer. Call tomorrow.   Contact information:   [address] [place] [address] [phone]         Duration of Discharge Encounter: Greater than 30 minutes including physician and PA time.  Signed, Melina Copa PA-C 10/17/2014, 5:39 PM

## 2014-10-17 NOTE — CV Procedure (Signed)
Sheath removed @ 1230 by Nelda Severe

## 2014-10-17 NOTE — Consult Note (Signed)
ANTICOAGULATION CONSULT NOTE  Pharmacy Consult for Heparin Indication: chest pain/ACS  No Known Allergies  Patient Measurements: Height: 5\' 7"  (170.2 cm) Weight: 138 lb 1.6 oz (62.642 kg) IBW/kg (Calculated) : 61.6 Heparin Dosing Weight: 64kg  Vital Signs: Temp: 98 F (36.7 C) (01/26 0518) Temp Source: Oral (01/26 0518) BP: 137/70 mmHg (01/26 0518) Pulse Rate: 62 (01/26 0518)  Labs:  Recent Labs  10/16/14 1703 10/16/14 2157 10/17/14 0450 10/17/14 0706  HGB 14.0  --  12.2  --   HCT 41.8  --  36.6  --   PLT 205  --  156  --   LABPROT  --  13.4  --   --   INR  --  1.01  --   --   HEPARINUNFRC  --   --   --  0.38  CREATININE 0.58  --  0.61  --   TROPONINI  --  <0.03 <0.03  --     Estimated Creatinine Clearance: 56.4 mL/min (by C-G formula based on Cr of 0.61).   Medical History: Past Medical History  Diagnosis Date  . Osteoporosis   . Aneurysm of ascending aorta   . History of ovarian cancer   . Raynaud's disease   . GERD (gastroesophageal reflux disease)   . Insomnia     Medications:  No anticoagulants pta  Assessment: 89 yof with no history of CAD presented to the ED with chest pain. It started 2 days ago while shoveling snow, resolved, and then returned yesterday while she was vacuuming. Troponins negative thus far. Remains on heparin gtt, HL therapeutic this morning, CBC stable and wnl.  Goal of Therapy:  Heparin level 0.3-0.7 units/ml Monitor platelets by anticoagulation protocol: Yes   Plan:  1) Continue heparin at 800 units//h/r 2) Daily heparin level, CBC 3) F/u after cath today   Cindy Schwartz, Cindy Schwartz 10/17/2014,9:22 AM

## 2014-10-17 NOTE — Interval H&P Note (Signed)
Cath Lab Visit (complete for each Cath Lab visit)  Clinical Evaluation Leading to the Procedure:   ACS: No.  Non-ACS:    Anginal Classification: CCS III  Anti-ischemic medical therapy: Maximal Therapy (2 or more classes of medications)  Non-Invasive Test Results: No non-invasive testing performed  Prior CABG: No previous CABG      History and Physical Interval Note:  10/17/2014 11:40 AM  Cindy Schwartz  has presented today for surgery, with the diagnosis of cp  The various methods of treatment have been discussed with the patient and family. After consideration of risks, benefits and other options for treatment, the patient has consented to  Procedure(s): LEFT HEART CATHETERIZATION WITH CORONARY ANGIOGRAM (N/A) as a surgical intervention .  The patient's history has been reviewed, patient examined, no change in status, stable for surgery.  I have reviewed the patient's chart and labs.  Questions were answered to the patient's satisfaction.     KELLY,THOMAS A

## 2014-10-27 ENCOUNTER — Telehealth: Payer: Self-pay | Admitting: Internal Medicine

## 2014-10-27 MED ORDER — NEXIUM 40 MG PO CPDR: 40.0000 mg | DELAYED_RELEASE_CAPSULE | Freq: Every day | ORAL | Status: DC

## 2014-10-27 NOTE — Telephone Encounter (Signed)
Spoke to pt she said that she takes Brand Name Nexium only and would like new Rx sent to [facility] and she will send back Rx. Told her okay sorry did not know she was taking Brand name will send new Rx. Pt verbalized understanding. Rx sent.

## 2014-10-27 NOTE — Telephone Encounter (Signed)
Pt said she received the following med in the mail from [facility]  esomeprazole ([place]) 40 MG capsule   Pt said she received the generic and she can not take the generic she said she has to have [place]

## 2014-10-30 ENCOUNTER — Telehealth: Payer: Self-pay | Admitting: Physician Assistant

## 2014-10-30 NOTE — Telephone Encounter (Addendum)
error 

## 2014-10-31 ENCOUNTER — Encounter: Payer: Medicare Other | Admitting: Nurse Practitioner

## 2014-10-31 ENCOUNTER — Other Ambulatory Visit: Payer: Self-pay | Admitting: *Deleted

## 2014-10-31 DIAGNOSIS — I712 Thoracic aortic aneurysm, without rupture, unspecified: Secondary | ICD-10-CM

## 2014-11-13 ENCOUNTER — Ambulatory Visit (INDEPENDENT_AMBULATORY_CARE_PROVIDER_SITE_OTHER): Payer: Medicare Other | Admitting: Physician Assistant

## 2014-11-13 ENCOUNTER — Encounter: Payer: Self-pay | Admitting: Physician Assistant

## 2014-11-13 VITALS — BP 138/84 | HR 73 | Ht 67.0 in | Wt 145.0 lb

## 2014-11-13 DIAGNOSIS — R002 Palpitations: Secondary | ICD-10-CM | POA: Diagnosis not present

## 2014-11-13 DIAGNOSIS — I712 Thoracic aortic aneurysm, without rupture, unspecified: Secondary | ICD-10-CM

## 2014-11-13 DIAGNOSIS — R079 Chest pain, unspecified: Secondary | ICD-10-CM | POA: Diagnosis not present

## 2014-11-13 NOTE — Patient Instructions (Addendum)
Your physician recommends that you continue on your current medications as directed. Please refer to the Current Medication list given to you today.  Your physician has recommended that you wear an event monitor. Event monitors are medical devices that record the heart's electrical activity. Doctors most often [place] these monitors to diagnose arrhythmias. Arrhythmias are problems with the speed or rhythm of the heartbeat. The monitor is a small, portable device. You can wear one while you do your normal daily activities. This is usually used to diagnose what is causing palpitations/syncope (passing out).   Your physician recommends that you schedule a follow-up appointment in:   WITH DR Johnsie Cancel IN 1 TO 2 MONTHS

## 2014-11-13 NOTE — Progress Notes (Signed)
Cardiology Office Note   Date:  11/13/2014   ID:  Cindy Schwartz, DOB 1934-02-10, MRN [id number]  PCP:  Cindy Cowden, MD  Cardiologist: Cindy Schwartz  Chief Complaint: Palpitations    History of Present Illness: Cindy Schwartz is a 81 y.o. female who presents for post hospital follow-up. Patient was admitted to the hospital with chest pain associated with blood pressures of 171/109 which was unusual for her. Cardiac catheterization showed normal coronary arteries, normal LV function and mild to moderate dilatation of the ascending aorta without evidence of dissection. Coreg was recommended for blood pressure control. She also has a history of a thoracic aortic aneurysm last measured in 2014 at 4.2-4.3 cm followed by Dr. Cyndia Schwartz. She is scheduled for an MRA on 11/29/14.  The patient comes in today complaining of palpitations. She says her heart seems to be pounding or skipping or racing at various times. She notices it mostly when she lays on her left side at night. She has no associated dizziness or presyncope with this. She has not started the Coreg because she is afraid of taking medications.    Past Medical History  Diagnosis Date  . Osteoporosis   . Aneurysm of ascending aorta     a. 4.2-4.3cm in 2014.  [place] Kitchen History of ovarian cancer   . Raynaud's disease   . GERD (gastroesophageal reflux disease)   . Insomnia   . Normal coronary arteries Cath - 09/2014  . Elevated BP Transient 09/2014    Past Surgical History  Procedure Laterality Date  . Ovarian ca s/p taj/bso  1979  . Abdominal hysterectomy    . Appendectomy    . Bilateral salpingoophorectomy    . Left heart catheterization with coronary angiogram N/A 10/17/2014    Procedure: LEFT HEART CATHETERIZATION WITH CORONARY ANGIOGRAM;  Surgeon: Cindy Sine, MD;  Location: [facility] CATH LAB;  Service: Cardiovascular;  Laterality: N/A;     Current Outpatient Prescriptions  Medication Sig Dispense Refill  . NEXIUM 40 MG capsule Take  1 capsule (40 mg total) by mouth daily. 90 capsule 3  . carvedilol (COREG) 3.125 MG tablet Take 1 tablet (3.125 mg total) by mouth 2 (two) times daily with a meal. (Patient not taking: Reported on 11/13/2014) 60 tablet 2   No current facility-administered medications for this visit.    Allergies:   Review of patient's allergies indicates no known allergies.    Social History:  The patient  reports that she has never smoked. She has never used smokeless tobacco. She reports that she does not drink alcohol or use illicit drugs.   Family History:  The patient's family history is not on file.    ROS:  Please see the history of present illness.   Otherwise, review of systems are positive for none.   All other systems are reviewed and negative.    PHYSICAL EXAM: VS:  BP 138/84 mmHg  Pulse 73  Ht 5\' 7"  (1.702 m)  Wt 145 lb (65.772 kg)  BMI 22.71 kg/m2 , BMI Body mass index is 22.71 kg/(m^2). GEN: Well nourished, well developed, in no acute distress HEENT: normal Neck: no JVD, HJR, carotid bruits, or masses Cardiac:RRR; no gallop ,murmurs, rubs, thrill or heave,no edema,   Respiratory:  clear to auscultation bilaterally, normal work of breathing GI: soft, nontender, nondistended, + BS MS: no deformity or atrophy Extremities: Right groin without hematoma or hemorrhage at cath site otherwise lower extremities without cyanosis, clubbing, edema, good distal pulses bilaterally.  Skin: warm and  dry, no rash Neuro:  Strength and sensation are intact Psych: euthymic mood, full affect   EKG:  EKG is ordered today. The ekg ordered today demonstrates normal sinus rhythm with PVCs, right bundle branch block   Recent Labs: 10/16/2014: TSH 3.575 10/17/2014: ALT 14; BUN 10; Creatinine 0.58; Hemoglobin 13.2; Platelets 167; Potassium 3.7; Sodium 138    Lipid Panel    Component Value Date/Time   CHOL 175 10/17/2014 0450   TRIG 23 10/17/2014 0450   HDL 63 10/17/2014 0450   CHOLHDL 2.8 10/17/2014  0450   VLDL 5 10/17/2014 0450   LDLCALC 107* 10/17/2014 0450   LDLDIRECT 127.5 12/20/2012 0905      Wt Readings from Last 3 Encounters:  11/13/14 145 lb (65.772 kg)  10/17/14 138 lb 1.6 oz (62.642 kg)  10/16/14 145 lb (65.772 kg)      Other studies Reviewed: Additional studies/ records that were reviewed today include: Review of the above records demonstrates:  ANGIOGRAPHY:  Left main: Upward takeoff vessel that was long and angiographically normal and trifurcated into the LAD, ramus intermediate, and left circumflex coronary artery.  LAD: Angiographically normal and gave rise to several septal perforating arteries and one major bifurcating diagonal branch.  Ramus Intermediate: Angiographically normal moderate sized vessel.  Left circumflex: Angiographically normal vessel.  Right coronary artery: Normal vessel which ended in the PLA-like branch.   Left ventriculography revealed normal LV systolic function with an ejection fraction of approximately 60%.  Supra valvular Aortagraphy revealed a dilated ascending aorta.  There was no evidence for dissection.  There was no evidence for aortic insufficiency.  IMPRESSION:  Normal LV function.  Normal coronary arteries.  Mild to moderate dilatation of the ascending aorta without evidence for dissection.   Cindy Sine, MD, [facility] 10/17/2014 12:24 PM         ASSESSMENT AND PLAN: Chest pain Patient has had no further chest pain. Cardiac catheterization revealed normal coronary arteries and LV function.   Palpitations Patient has been having an increase in her palpitations. She is having some PVCs on EKG. We will place an event recorder to make sure she is not having any arrhythmias. Recommend taking her Coreg. Follow-up with Cindy Schwartz at her request as she is family friends   Aneurysm of thoracic aorta To have MRA in March.      Cindy Boast, PA-C  11/13/2014 10:36 AM    [facility] [place], [place], [place]  [address] Phone: [phone]; Fax: [phone]

## 2014-11-13 NOTE — Assessment & Plan Note (Signed)
To have MRA in March.

## 2014-11-13 NOTE — Assessment & Plan Note (Signed)
Patient has had no further chest pain. Cardiac catheterization revealed normal coronary arteries and LV function.

## 2014-11-13 NOTE — Assessment & Plan Note (Signed)
Patient has been having an increase in her palpitations. She is having some PVCs on EKG. We will place an event recorder to make sure she is not having any arrhythmias. Recommend taking her Coreg. Follow-up with Dr. Johnsie Cancel at her request as she is family friends

## 2014-11-15 ENCOUNTER — Encounter (INDEPENDENT_AMBULATORY_CARE_PROVIDER_SITE_OTHER): Payer: Medicare Other

## 2014-11-15 ENCOUNTER — Encounter: Payer: Self-pay | Admitting: *Deleted

## 2014-11-15 ENCOUNTER — Other Ambulatory Visit: Payer: Self-pay | Admitting: *Deleted

## 2014-11-15 DIAGNOSIS — R002 Palpitations: Secondary | ICD-10-CM | POA: Diagnosis not present

## 2014-11-15 DIAGNOSIS — R079 Chest pain, unspecified: Secondary | ICD-10-CM

## 2014-11-15 NOTE — Progress Notes (Signed)
Patient ID: Cindy Schwartz, female   DOB: [birth year], 81 y.o.   MRN: [id number] Prevent[employer] verite 30 day cardiac event monitor applied to patient.

## 2014-11-29 ENCOUNTER — Ambulatory Visit (INDEPENDENT_AMBULATORY_CARE_PROVIDER_SITE_OTHER): Payer: Medicare Other | Admitting: Surgery

## 2014-11-29 ENCOUNTER — Ambulatory Visit
Admission: RE | Admit: 2014-11-29 | Discharge: 2014-11-29 | Disposition: A | Discharge status: Home / Self Care | Payer: Medicare Other | Source: Ambulatory Visit | Attending: Surgery | Admitting: Surgery

## 2014-11-29 ENCOUNTER — Encounter: Payer: Self-pay | Admitting: Surgery

## 2014-11-29 VITALS — BP 120/77 | HR 84 | Resp 20 | Ht 67.0 in | Wt 145.0 lb

## 2014-11-29 DIAGNOSIS — I712 Thoracic aortic aneurysm, without rupture, unspecified: Secondary | ICD-10-CM

## 2014-11-29 DIAGNOSIS — I200 Unstable angina: Secondary | ICD-10-CM

## 2014-11-29 DIAGNOSIS — I7121 Aneurysm of the ascending aorta, without rupture: Secondary | ICD-10-CM

## 2014-11-29 MED ORDER — GADOBENATE DIMEGLUMINE 529 MG/ML IV SOLN
13.0000 mL | Freq: Once | INTRAVENOUS | Status: AC | PRN
  Administered 2014-11-29: 13 mL via INTRAVENOUS

## 2014-11-30 ENCOUNTER — Encounter: Payer: Self-pay | Admitting: Surgery

## 2014-11-30 NOTE — Progress Notes (Signed)
     HPI:  Cindy Schwartz returns today for follow up of an ascending aortic aneurysm. Her last MRA was 2 years ago and the diameter was 4.2-4.3 cm which was unchanged from 11/2010. Since I last saw her Cindy Schwartz has been doing well without problems.  Current Outpatient Prescriptions  Medication Sig Dispense Refill  . NEXIUM 40 MG capsule Take 1 capsule (40 mg total) by mouth daily. 90 capsule 3   No current facility-administered medications for this visit.     Physical Exam: BP 120/77 mmHg  Pulse 84  Resp 20  Ht 5\' 7"  (1.702 m)  Wt 145 lb (65.772 kg)  BMI 22.71 kg/m2  SpO2 98% Cindy Schwartz looks well Cardiac exam shows a regular rate and rhythm with normal heart sounds Lungs are clear  Diagnostic Tests:  CLINICAL DATA: 81 year old female with thoracic aortic aneurysm.  EXAM: MRA CHEST WITH OR WITHOUT CONTRAST  TECHNIQUE: Angiographic images of the chest were obtained using MRA technique without and with intravenous contrast.  BUN and creatinine were obtained on site at [facility] at  [address]  Results: BUN 17 mg/dL, Creatinine 0.7 mg/dL, GFR greater than 60  CONTRAST: 13 mL MULTIHANCE GADOBENATE DIMEGLUMINE 529 MG/ML IV SOLN  COMPARISON: Prior chest MRA 11/30/2012  FINDINGS: VASCULAR  Stable fusiform aneurysmal dilatation of the tubular portion of the thoracic aorta at 4.2 cm (measured at the right main pulmonary artery). No effacement of the sino-tubular junction. Ectasia of the aortic arch measuring 3.3 cm between the origins of the left common carotid and left subclavian arteries. This may be minimally larger than 3.1 cm measured on the prior study although likely within measurement error. The distal arch and descending thoracic aorta are normal in caliber. Normal size of the pulmonary arteries. No central pulmonary embolus. Decreased AP diameter the chest results in some flattening of the cardiac morphology. The heart itself remains within  normal limits for size.  Conventional 3 vessel aortic arch anatomy. No evidence of dissection. Negative for pericardial effusion.  NON VASCULAR  No new focal signal abnormality or abnormal enhancement in the remainder of the visualized chest. Stable foci of increased T2 signal and enhancement within the mid thoracic spine dating back to at least March of 2010 and favored to represent benign hemangiomas.  IMPRESSION: VASCULAR  1. Continued stability of fusiform aneurysmal dilatation of the tubular portion of the ascending thoracic aorta. Maximal transverse diameter is 4.2 cm. NON VASCULAR  1. Ancillary findings as above without significant interval change.  Signed,  Criselda Peaches, MD  [facility]  [facility]   Electronically Signed  By: Jacqulynn Cadet M.D.  On: 11/29/2014 14:13   Impression:  Cindy Schwartz has a stable fusiform ascending aortic aneurysm that is 4.2 cm in greatest diameter. This does not require any surgical treatment at this time but should be followed. Her BP is under good control. This aneurysm has been stable since 2005 so I think a followup scan in 2 years should be fine at this size.  Plan:  I will see her back in 2 years with an MRA of the chest.   Gaye Pollack, MD [facility] [phone]

## 2014-12-25 ENCOUNTER — Encounter: Payer: Self-pay | Admitting: Internal Medicine

## 2014-12-25 ENCOUNTER — Ambulatory Visit (INDEPENDENT_AMBULATORY_CARE_PROVIDER_SITE_OTHER): Payer: Medicare Other | Admitting: Internal Medicine

## 2014-12-25 VITALS — BP 120/80 | HR 56 | Temp 97.9°F | Resp 20 | Ht 66.0 in | Wt 145.0 lb

## 2014-12-25 DIAGNOSIS — J3089 Other allergic rhinitis: Secondary | ICD-10-CM | POA: Diagnosis not present

## 2014-12-25 DIAGNOSIS — Z0000 Encounter for general adult medical examination without abnormal findings: Secondary | ICD-10-CM

## 2014-12-25 DIAGNOSIS — M810 Age-related osteoporosis without current pathological fracture: Secondary | ICD-10-CM

## 2014-12-25 DIAGNOSIS — I712 Thoracic aortic aneurysm, without rupture, unspecified: Secondary | ICD-10-CM

## 2014-12-25 DIAGNOSIS — K219 Gastro-esophageal reflux disease without esophagitis: Secondary | ICD-10-CM | POA: Diagnosis not present

## 2014-12-25 DIAGNOSIS — I200 Unstable angina: Secondary | ICD-10-CM | POA: Diagnosis not present

## 2014-12-25 MED ORDER — VALTREX 500 MG PO TABS: 500.0000 mg | ORAL_TABLET | Freq: Every day | ORAL | Status: DC | PRN

## 2014-12-25 NOTE — Patient Instructions (Addendum)
It is important that you exercise regularly, at least 20 minutes 3 to 4 times per week.  If you develop chest pain or shortness of breath seek  medical attention.  Return in 6 months for follow-up  Take a calcium supplement, plus [id number] units of vitamin D  Health Maintenance Adopting a healthy lifestyle and getting preventive care can go a long way to promote health and wellness. Talk with your health care provider about what schedule of regular examinations is right for you. This is a good chance for you to check in with your provider about disease prevention and staying healthy. In between checkups, there are plenty of things you can do on your own. Experts have done a lot of research about which lifestyle changes and preventive measures are most likely to keep you healthy. Ask your health care provider for more information. WEIGHT AND DIET  Eat a healthy diet  Be sure to include plenty of vegetables, fruits, low-fat dairy products, and lean protein.  Do not eat a lot of foods high in solid fats, added sugars, or salt.  Get regular exercise. This is one of the most important things you can do for your health.  Most adults should exercise for at least 150 minutes each week. The exercise should increase your heart rate and make you sweat (moderate-intensity exercise).  Most adults should also do strengthening exercises at least twice a week. This is in addition to the moderate-intensity exercise.  Maintain a healthy weight  Body mass index (BMI) is a measurement that can be used to identify possible weight problems. It estimates body fat based on height and weight. Your health care provider can help determine your BMI and help you achieve or maintain a healthy weight.  For females 41 years of age and older:   A BMI below 18.5 is considered underweight.  A BMI of 18.5 to 24.9 is normal.  A BMI of 25 to 29.9 is considered overweight.  A BMI of 30 and above is considered obese.   Watch levels of cholesterol and blood lipids  You should start having your blood tested for lipids and cholesterol at 79 years of age, then have this test every 5 years.  You may need to have your cholesterol levels checked more often if:  Your lipid or cholesterol levels are high.  You are older than 79 years of age.  You are at high risk for heart disease.  CANCER SCREENING   Lung Cancer  Lung cancer screening is recommended for adults 6-79 years old who are at high risk for lung cancer because of a history of smoking.  A yearly low-dose CT scan of the lungs is recommended for people who:  Currently smoke.  Have quit within the past 15 years.  Have at least a 30-pack-year history of smoking. A pack year is smoking an average of one pack of cigarettes a day for 1 year.  Yearly screening should continue until it has been 15 years since you quit.  Yearly screening should stop if you develop a health problem that would prevent you from having lung cancer treatment.  Breast Cancer  Practice breast self-awareness. This means understanding how your breasts normally appear and feel.  It also means doing regular breast self-exams. Let your health care provider know about any changes, no matter how small.  If you are in your 20s or 30s, you should have a clinical breast exam (CBE) by a health care provider every 1-3 years as  part of a regular health exam.  If you are 40 or older, have a CBE every year. Also consider having a breast X-ray (mammogram) every year.  If you have a family history of breast cancer, talk to your health care provider about genetic screening.  If you are at high risk for breast cancer, talk to your health care provider about having an MRI and a mammogram every year.  Breast cancer gene (BRCA) assessment is recommended for women who have family members with BRCA-related cancers. BRCA-related cancers  include:  Breast.  Ovarian.  Tubal.  Peritoneal cancers.  Results of the assessment will determine the need for genetic counseling and BRCA1 and BRCA2 testing. Cervical Cancer Routine pelvic examinations to screen for cervical cancer are no longer recommended for nonpregnant women who are considered low risk for cancer of the pelvic organs (ovaries, uterus, and vagina) and who do not have symptoms. A pelvic examination may be necessary if you have symptoms including those associated with pelvic infections. Ask your health care provider if a screening pelvic exam is right for you.   The Pap test is the screening test for cervical cancer for women who are considered at risk.  If you had a hysterectomy for a problem that was not cancer or a condition that could lead to cancer, then you no longer need Pap tests.  If you are older than 65 years, and you have had normal Pap tests for the past 10 years, you no longer need to have Pap tests.  If you have had past treatment for cervical cancer or a condition that could lead to cancer, you need Pap tests and screening for cancer for at least 20 years after your treatment.  If you no longer get a Pap test, assess your risk factors if they change (such as having a new sexual partner). This can affect whether you should start being screened again.  Some women have medical problems that increase their chance of getting cervical cancer. If this is the case for you, your health care provider may recommend more frequent screening and Pap tests.  The human papillomavirus (HPV) test is another test that may be used for cervical cancer screening. The HPV test looks for the virus that can cause cell changes in the cervix. The cells collected during the Pap test can be tested for HPV.  The HPV test can be used to screen women 30 years of age and older. Getting tested for HPV can extend the interval between normal Pap tests from three to five years.  An HPV  test also should be used to screen women of any age who have unclear Pap test results.  After 79 years of age, women should have HPV testing as often as Pap tests.  Colorectal Cancer  This type of cancer can be detected and often prevented.  Routine colorectal cancer screening usually begins at 79 years of age and continues through 79 years of age.  Your health care provider may recommend screening at an earlier age if you have risk factors for colon cancer.  Your health care provider may also recommend using home test kits to check for hidden blood in the stool.  A small camera at the end of a tube can be used to examine your colon directly (sigmoidoscopy or colonoscopy). This is done to check for the earliest forms of colorectal cancer.  Routine screening usually begins at age 23.  Direct examination of the colon should be repeated every 5-10  years through 79 years of age. However, you may need to be screened more often if early forms of precancerous polyps or small growths are found. Skin Cancer  Check your skin from head to toe regularly.  Tell your health care provider about any new moles or changes in moles, especially if there is a change in a mole's shape or color.  Also tell your health care provider if you have a mole that is larger than the size of a pencil eraser.  Always use sunscreen. Apply sunscreen liberally and repeatedly throughout the day.  Protect yourself by wearing long sleeves, pants, a wide-brimmed hat, and sunglasses whenever you are outside. HEART DISEASE, DIABETES, AND HIGH BLOOD PRESSURE   Have your blood pressure checked at least every 1-2 years. High blood pressure causes heart disease and increases the risk of stroke.  If you are between 46 years and 58 years old, ask your health care provider if you should take aspirin to prevent strokes.  Have regular diabetes screenings. This involves taking a blood sample to check your fasting blood sugar  level.  If you are at a normal weight and have a low risk for diabetes, have this test once every three years after 79 years of age.  If you are overweight and have a high risk for diabetes, consider being tested at a younger age or more often. PREVENTING INFECTION  Hepatitis B  If you have a higher risk for hepatitis B, you should be screened for this virus. You are considered at high risk for hepatitis B if:  You were born in a country where hepatitis B is common. Ask your health care provider which countries are considered high risk.  Your parents were born in a high-risk country, and you have not been immunized against hepatitis B (hepatitis B vaccine).  You have HIV or AIDS.  You use needles to inject street drugs.  You live with someone who has hepatitis B.  You have had sex with someone who has hepatitis B.  You get hemodialysis treatment.  You take certain medicines for conditions, including cancer, organ transplantation, and autoimmune conditions. Hepatitis C  Blood testing is recommended for:  Everyone born from [birth year] through 1963.  Anyone with known risk factors for hepatitis C. Sexually transmitted infections (STIs)  You should be screened for sexually transmitted infections (STIs) including gonorrhea and chlamydia if:  You are sexually active and are younger than 79 years of age.  You are older than 79 years of age and your health care provider tells you that you are at risk for this type of infection.  Your sexual activity has changed since you were last screened and you are at an increased risk for chlamydia or gonorrhea. Ask your health care provider if you are at risk.  If you do not have HIV, but are at risk, it may be recommended that you take a prescription medicine daily to prevent HIV infection. This is called pre-exposure prophylaxis (PrEP). You are considered at risk if:  You are sexually active and do not regularly use condoms or know the HIV status  of your partner(s).  You take drugs by injection.  You are sexually active with a partner who has HIV. Talk with your health care provider about whether you are at high risk of being infected with HIV. If you choose to begin PrEP, you should first be tested for HIV. You should then be tested every 3 months for as long as you are  taking PrEP.  PREGNANCY   If you are premenopausal and you may become pregnant, ask your health care provider about preconception counseling.  If you may become pregnant, take 400 to 800 micrograms (mcg) of folic acid every day.  If you want to prevent pregnancy, talk to your health care provider about birth control (contraception). OSTEOPOROSIS AND MENOPAUSE   Osteoporosis is a disease in which the bones lose minerals and strength with aging. This can result in serious bone fractures. Your risk for osteoporosis can be identified using a bone density scan.  If you are 110 years of age or older, or if you are at risk for osteoporosis and fractures, ask your health care provider if you should be screened.  Ask your health care provider whether you should take a calcium or vitamin D supplement to lower your risk for osteoporosis.  Menopause may have certain physical symptoms and risks.  Hormone replacement therapy may reduce some of these symptoms and risks. Talk to your health care provider about whether hormone replacement therapy is right for you.  HOME CARE INSTRUCTIONS   Schedule regular health, dental, and eye exams.  Stay current with your immunizations.   Do not use any tobacco products including cigarettes, chewing tobacco, or electronic cigarettes.  If you are pregnant, do not drink alcohol.  If you are breastfeeding, limit how much and how often you drink alcohol.  Limit alcohol intake to no more than 1 drink per day for nonpregnant women. One drink equals 12 ounces of beer, 5 ounces of wine, or 1 ounces of hard liquor.  Do not use street  drugs.  Do not share needles.  Ask your health care provider for help if you need support or information about quitting drugs.  Tell your health care provider if you often feel depressed.  Tell your health care provider if you have ever been abused or do not feel safe at home. Document Released: 03/24/2011 Document Revised: 01/23/2014 Document Reviewed: 08/10/2013 [facility] Patient Information 2015 [place], [place]. This information is not intended to replace advice given to you by your health care provider. Make sure you discuss any questions you have with your health care provider. Exercise to Stay Healthy Exercise helps you become and stay healthy. EXERCISE IDEAS AND TIPS Choose exercises that:  You enjoy.  Fit into your day. You do not need to exercise really hard to be healthy. You can do exercises at a slow or medium level and stay healthy. You can:  Stretch before and after working out.  Try yoga, Pilates, or tai chi.  Lift weights.  Walk fast, swim, jog, run, climb stairs, bicycle, dance, or rollerskate.  Take aerobic classes. Exercises that burn about 150 calories:  Running 1  miles in 15 minutes.  Playing volleyball for 45 to 60 minutes.  Washing and waxing a car for 45 to 60 minutes.  Playing touch football for 45 minutes.  Walking 1  miles in 35 minutes.  Pushing a stroller 1  miles in 30 minutes.  Playing basketball for 30 minutes.  Raking leaves for 30 minutes.  Bicycling 5 miles in 30 minutes.  Walking 2 miles in 30 minutes.  Dancing for 30 minutes.  Shoveling snow for 15 minutes.  Swimming laps for 20 minutes.  Walking up stairs for 15 minutes.  Bicycling 4 miles in 15 minutes.  Gardening for 30 to 45 minutes.  Jumping rope for 15 minutes.  Washing windows or floors for 45 to 60 minutes. Document Released:  10/11/2010 Document Revised: 12/01/2011 Document Reviewed: 10/11/2010 [employer] Patient Information 2015 [place], [place]. This  information is not intended to replace advice given to you by your health care provider. Make sure you discuss any questions you have with your health care provider.

## 2014-12-25 NOTE — Progress Notes (Signed)
Pre visit review using our clinic review tool, if applicable. No additional management support is needed unless otherwise documented below in the visit note. 

## 2014-12-25 NOTE — Progress Notes (Signed)
Subjective:    Patient ID: Cindy Schwartz, female    DOB: 1934-03-17, 81 y.o.   MRN: [id number]  HPI   81 -year-old patient who is seen today for an annual health examination. She enjoys excellent health.  She does have a history of an aneurysm of the descending thoracic aorta which has been stable.  She has been receiving imaging studies on a two-year basis and this has been quite stable.  She has a history of osteoporosis and allergic rhinitis. She continues to have left-sided rhinorrhea.  She has had any ENT evaluation, including imaging studies She has gastroesophageal reflux disease, and remains on chronic PPI therapy. No major concerns or complaints except weakness over the past 4 weeks.  She was hospitalized in January for evaluation of chest pain that included a heart catheterization and 2-D echocardiogram. She has been treated with Fosamax for a number of years in the past. Statin therapy was discussed and she wishes to defer  Past Medical History  Diagnosis Date  . Osteoporosis   . Aneurysm of ascending aorta     a. 4.2-4.3cm in 2014.  Marland Kitchen History of ovarian cancer   . Raynaud's disease   . GERD (gastroesophageal reflux disease)   . Insomnia   . Normal coronary arteries Cath - 09/2014  . Elevated BP Transient 09/2014    History   Social History  . Marital Status: Married    Spouse Name: N/A  . Number of Children: N/A  . Years of Education: N/A   Occupational History  . retired    Social History Main Topics  . Smoking status: Never Smoker   . Smokeless tobacco: Never Used  . Alcohol Use: No  . Drug Use: No  . Sexual Activity: Not on file   Other Topics Concern  . Not on file   Social History Narrative   She and her husband are from the [place].`    Past Surgical History  Procedure Laterality Date  . Ovarian ca s/p taj/bso  1979  . Abdominal hysterectomy    . Appendectomy    . Bilateral salpingoophorectomy    . Left heart catheterization  with coronary angiogram N/A 10/17/2014    Procedure: LEFT HEART CATHETERIZATION WITH CORONARY ANGIOGRAM;  Surgeon: [name], MD;  Location: [facility] CATH LAB;  Service: Cardiovascular;  Laterality: N/A;    No family history on file.  No Known Allergies  Current Outpatient Prescriptions on File Prior to Visit  Medication Sig Dispense Refill  . NEXIUM 40 MG capsule Take 1 capsule (40 mg total) by mouth daily. 90 capsule 3   No current facility-administered medications on file prior to visit.    There were no vitals taken for this visit.  1. Risk factors, based on past  M,S,F history.  Cardiac risk factors include a history of aneurysm of the thoracic aorta.  She has normal coronary arteries based on catheterization in January 2016  2.  Physical activities: No regular exercise program but remains very active with house chores  3.  Depression/mood: No history of major depression, does complain of weakness of about 4 weeks duration  4.  Hearing: No deficits  5.  ADL's: Independent in all aspects of daily living  6.  Fall risk: Low  7.  Home safety: No problems identified  8.  Height weight, and visual acuity; height and weight stable no change in visual acuity does have annual eye examinations   9.  Counseling: More rigorous  exercise regimen encouraged  10. Lab orders based on risk factors: Hospital records and lab reviewed from January 2016 all unremarkable including lipid profile and TSH  11. Referral : Follow-up vascular surgeon.  Annually  12. Care plan: We'll continue heart healthy diet.  We'll attempt to increase exercise regimen  13. Cognitive assessment: Alert in order with normal affect.  No cognitive dysfunction  14. Screening: We'll continue annual health exams with screening lab.  We'll continue to screen for her thoracic aortic aneurysm.  Patient was provided with a written and personalized care plan  15. Provider List Update:  Vascular surgery primary care  medicine      Review of Systems  Constitutional: Negative.   HENT: Positive for rhinorrhea. Negative for congestion, dental problem, hearing loss, sinus pressure, sore throat and tinnitus.   Eyes: Negative for pain, discharge and visual disturbance.  Respiratory: Negative for cough and shortness of breath.   Cardiovascular: Negative for chest pain, palpitations and leg swelling.  Gastrointestinal: Negative for nausea, vomiting, abdominal pain, diarrhea, constipation, blood in stool and abdominal distention.  Genitourinary: Negative for dysuria, urgency, frequency, hematuria, flank pain, vaginal bleeding, vaginal discharge, difficulty urinating, vaginal pain and pelvic pain.  Musculoskeletal: Negative for joint swelling, arthralgias and gait problem.  Skin: Negative for rash.  Neurological: Negative for dizziness, syncope, speech difficulty, weakness, numbness and headaches.  Hematological: Negative for adenopathy.  Psychiatric/Behavioral: Negative for behavioral problems, dysphoric mood and agitation. The patient is not nervous/anxious.        Objective:   Physical Exam  Constitutional: She is oriented to person, place, and time. She appears well-developed and well-nourished.  HENT:  Head: Normocephalic.  Right Ear: External ear normal.  Left Ear: External ear normal.  Mouth/Throat: Oropharynx is clear and moist.  Eyes: Conjunctivae and EOM are normal. Pupils are equal, round, and reactive to light.  Neck: Normal range of motion. Neck supple. No thyromegaly present.  Cardiovascular: Normal rate, regular rhythm and normal heart sounds.   Right dorsalis pedis pulse only palpable  Pulmonary/Chest: Effort normal and breath sounds normal.  Abdominal: Soft. Bowel sounds are normal. She exhibits no mass. There is no tenderness.  Musculoskeletal: Normal range of motion.  Lymphadenopathy:    She has no cervical adenopathy.  Neurological: She is alert and oriented to person, place, and  time.  Skin: Skin is warm and dry. No rash noted.  Psychiatric: She has a normal mood and affect. Her behavior is normal.          Assessment & Plan:   Osteoarthritis stable.  Allergic rhinitis History of thoracic aortic aneurysm Osteoporosis.  Calcium vitamin D recommended  Medicines updated CPX 6 months  Statin therapy discussed.  Patient wishes to defer

## 2014-12-26 NOTE — Progress Notes (Signed)
Patient ID: Cindy Schwartz, female   DOB: [birth year], 81 y.o.   MRN: [id number]  Cardiology Office Note   Date:  12/27/2014   ID:  Cindy Schwartz, DOB May 11, 1934, MRN [id number]  PCP:  Nyoka Cowden, MD  Cardiologist: Jenkins Rouge  Chief Complaint: HTN Palpitations Aortic Aneurysm    History of Present Illness: Cindy Schwartz is a 81 y.o. female who presents for post hospital follow-up.  She is new to me.  I take care of her son.  She was seen by PA in February and Dr [name] in March for stable fusiform dilatation of aortic root 4.2 cm Patient was admitted to the hospital with chest pain associated with blood pressures of 171/109 which was unusual for her. Cardiac catheterization showed normal coronary arteries, normal LV function and mild to moderate dilatation of the ascending aorta without evidence of dissection. Coreg was recommended for blood pressure control. She also has a history of a thoracic aortic aneurysm last measured in 2014 at 4.2-4.3 cm followed by Dr. [name]. She is scheduled for an MRA on 11/29/14.  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 Osteoporosis   . Aneurysm of ascending aorta     a. 4.2-4.3cm in 2014.  Marland Kitchen History of ovarian cancer   . Raynaud's disease   . GERD (gastroesophageal reflux disease)   . Insomnia   . Normal coronary arteries Cath - 09/2014  . Elevated BP Transient 09/2014    Past Surgical History  Procedure Laterality Date  . Ovarian ca s/p taj/bso  1979  . Abdominal hysterectomy    . Appendectomy    . Bilateral salpingoophorectomy    . Left heart catheterization with coronary angiogram N/A 10/17/2014    Procedure: LEFT HEART CATHETERIZATION WITH CORONARY ANGIOGRAM;  Surgeon: [name], MD;  Location: [facility] CATH LAB;   Service: Cardiovascular;  Laterality: N/A;     Current Outpatient Prescriptions  Medication Sig Dispense Refill  . NEXIUM 40 MG capsule Take 1 capsule (40 mg total) by mouth daily. 90 capsule 3  . VALTREX 500 MG tablet Take 1 tablet (500 mg total) by mouth daily as needed. 30 tablet 5   No current facility-administered medications for this visit.    Allergies:   Review of patient's allergies indicates no known allergies.    Social History:  The patient  reports that she has never smoked. She has never used smokeless tobacco. She reports that she does not drink alcohol or use illicit drugs.   Family History:  The patient's family history is not on file.    ROS:  Please see the history of present illness.   Otherwise, review of systems are positive for none.   All other systems are reviewed and negative.    PHYSICAL EXAM: VS:  BP 132/62 mmHg  Pulse 80  Ht 5\' 6"  (1.676 m)  Wt 144 lb 6.4 oz (65.499 kg)  BMI 23.32 kg/m2 , BMI Body mass index is 23.32 kg/(m^2). GEN: Well nourished, well developed, in no acute distress HEENT: normal Neck: no JVD, HJR, carotid bruits, or masses Cardiac:RRR; no gallop ,murmurs, rubs, thrill or heave,no edema,   Respiratory:  clear to auscultation bilaterally, normal work of breathing GI: soft, nontender, nondistended, + BS MS:  no deformity or atrophy Extremities: Right groin without hematoma or hemorrhage at cath site otherwise lower extremities without cyanosis, clubbing, edema, good distal pulses bilaterally.  Skin: warm and dry, no rash Neuro:  Strength and sensation are intact Psych: euthymic mood, full affect   EKG: The ekg ordered  11/13/14   demonstrates normal sinus rhythm with PVCs, right bundle branch block   Recent Labs: 10/16/2014: TSH 3.575 10/17/2014: ALT 14; BUN 10; Creatinine 0.58; Hemoglobin 13.2; Platelets 167; Potassium 3.7; Sodium 138    Lipid Panel    Component Value Date/Time   CHOL 175 10/17/2014 0450   TRIG 23  10/17/2014 0450   HDL 63 10/17/2014 0450   CHOLHDL 2.8 10/17/2014 0450   VLDL 5 10/17/2014 0450   LDLCALC 107* 10/17/2014 0450   LDLDIRECT 127.5 12/20/2012 0905      Wt Readings from Last 3 Encounters:  12/27/14 144 lb 6.4 oz (65.499 kg)  12/25/14 145 lb (65.772 kg)  11/29/14 145 lb (65.772 kg)      Other studies Reviewed: MRA and Heart Cath   MRA 11/29/14 1. Continued stability of fusiform aneurysmal dilatation of the tubular portion of the ascending thoracic aorta. Maximal transverse diameter is 4.2 cm.  ANGIOGRAPHY:  10/17/14    Left main: Upward takeoff vessel that was long and angiographically normal and trifurcated into the LAD, ramus intermediate, and left circumflex coronary artery.  LAD: Angiographically normal and gave rise to several septal perforating arteries and one major bifurcating diagonal branch.  Ramus Intermediate: Angiographically normal moderate sized vessel.  Left circumflex: Angiographically normal vessel.  Right coronary artery: Normal vessel which ended in the PLA-like branch.   Left ventriculography revealed normal LV systolic function with an ejection fraction of approximately 60%.  Supra valvular Aortagraphy revealed a dilated ascending aorta.  There was no evidence for dissection.  There was no evidence for aortic insufficiency.  IMPRESSION:  Normal LV function.  Normal coronary arteries.  Mild to moderate dilatation of the ascending aorta without evidence for dissection.   [name], MD, [facility] 10/17/2014 12:24 PM         ASSESSMENT AND PLAN: Palpitations:  Benign PVCls normal EF no CAD  offerred Inderal 10 mg PRN she declines Aneurysm: stsable 4.2 cm  MRA in 2 years followed by Bartle Chol:  LDL 107  Diet Rx BP:  Borderline BP discussed benefit of beta blocker for this and palpitations but she prefers to be on no meds  F/U with me in a year  No tests ordered    Signed, Jenkins Rouge, MD  12/27/2014 8:02 AM    [facility] [address], [place], [place]  [address] Phone: [phone]; Fax: [phone]

## 2014-12-27 ENCOUNTER — Ambulatory Visit (INDEPENDENT_AMBULATORY_CARE_PROVIDER_SITE_OTHER): Payer: Medicare Other | Admitting: Cardiovascular Disease

## 2014-12-27 ENCOUNTER — Encounter: Payer: Self-pay | Admitting: Cardiovascular Disease

## 2014-12-27 VITALS — BP 132/62 | HR 80 | Ht 66.0 in | Wt 144.4 lb

## 2014-12-27 DIAGNOSIS — R002 Palpitations: Secondary | ICD-10-CM

## 2014-12-27 DIAGNOSIS — I200 Unstable angina: Secondary | ICD-10-CM

## 2014-12-27 NOTE — Patient Instructions (Signed)
Your physician wants you to follow-up in: YEAR WITH DR NISHAN  You will receive a reminder letter in the mail two months in advance. If you don't receive a letter, please call our office to schedule the follow-up appointment.  Your physician recommends that you continue on your current medications as directed. Please refer to the Current Medication list given to you today. 

## 2014-12-28 ENCOUNTER — Encounter: Payer: Medicare Other | Admitting: Internal Medicine

## 2015-01-08 ENCOUNTER — Telehealth: Payer: Self-pay | Admitting: Internal Medicine

## 2015-01-08 NOTE — Telephone Encounter (Signed)
Patient would like a rx for Fluticasone to help with her allergies.   [facility], [place]

## 2015-01-09 MED ORDER — FLUTICASONE PROPIONATE 50 MCG/ACT NA SUSP: 2.0000 | Freq: Every day | NASAL | Status: DC

## 2015-01-09 NOTE — Telephone Encounter (Signed)
Left detailed message Rx sent to pharmacy. 

## 2015-01-09 NOTE — Telephone Encounter (Signed)
Dr. Sherren Mocha, okay to order Flonase for pt to help with allergies?

## 2015-01-22 DIAGNOSIS — J342 Deviated nasal septum: Secondary | ICD-10-CM | POA: Diagnosis not present

## 2015-01-22 DIAGNOSIS — J3089 Other allergic rhinitis: Secondary | ICD-10-CM | POA: Diagnosis not present

## 2015-05-22 ENCOUNTER — Other Ambulatory Visit: Payer: Self-pay

## 2015-05-22 DIAGNOSIS — Z1231 Encounter for screening mammogram for malignant neoplasm of breast: Secondary | ICD-10-CM

## 2015-06-06 ENCOUNTER — Ambulatory Visit
Admission: RE | Admit: 2015-06-06 | Discharge: 2015-06-06 | Disposition: A | Discharge status: Home / Self Care | Payer: Medicare Other | Source: Ambulatory Visit

## 2015-06-06 DIAGNOSIS — Z1231 Encounter for screening mammogram for malignant neoplasm of breast: Secondary | ICD-10-CM | POA: Diagnosis not present

## 2015-06-13 ENCOUNTER — Encounter: Payer: Self-pay | Admitting: Internal Medicine

## 2015-06-13 ENCOUNTER — Ambulatory Visit (INDEPENDENT_AMBULATORY_CARE_PROVIDER_SITE_OTHER): Payer: Medicare Other | Admitting: Internal Medicine

## 2015-06-13 VITALS — BP 110/64 | HR 79 | Temp 98.2°F | Resp 20 | Ht 66.0 in | Wt 143.0 lb

## 2015-06-13 DIAGNOSIS — R5383 Other fatigue: Secondary | ICD-10-CM

## 2015-06-13 DIAGNOSIS — J309 Allergic rhinitis, unspecified: Secondary | ICD-10-CM

## 2015-06-13 DIAGNOSIS — M545 Low back pain: Secondary | ICD-10-CM | POA: Diagnosis not present

## 2015-06-13 DIAGNOSIS — I200 Unstable angina: Secondary | ICD-10-CM

## 2015-06-13 LAB — CBC WITH DIFFERENTIAL/PLATELET
BASOS ABS: 0 10*3/uL (ref 0.0–0.1)
Basophils Relative: 0.5 % (ref 0.0–3.0)
Eosinophils Absolute: 0.3 10*3/uL (ref 0.0–0.7)
Eosinophils Relative: 4.7 % (ref 0.0–5.0)
HCT: 41 % (ref 36.0–46.0)
Hemoglobin: 13.6 g/dL (ref 12.0–15.0)
LYMPHS ABS: 2 10*3/uL (ref 0.7–4.0)
Lymphocytes Relative: 31.7 % (ref 12.0–46.0)
MCHC: 33.1 g/dL (ref 30.0–36.0)
MCV: 85.1 fl (ref 78.0–100.0)
MONO ABS: 0.7 10*3/uL (ref 0.1–1.0)
Monocytes Relative: 10.9 % (ref 3.0–12.0)
NEUTROS PCT: 52.2 % (ref 43.0–77.0)
Neutro Abs: 3.2 10*3/uL (ref 1.4–7.7)
Platelets: 218 10*3/uL (ref 150.0–400.0)
RBC: 4.82 Mil/uL (ref 3.87–5.11)
RDW: 15 % (ref 11.5–15.5)
WBC: 6.2 10*3/uL (ref 4.0–10.5)

## 2015-06-13 LAB — COMPREHENSIVE METABOLIC PANEL
ALBUMIN: 4.2 g/dL (ref 3.5–5.2)
ALK PHOS: 58 U/L (ref 39–117)
ALT: 12 U/L (ref 0–35)
AST: 17 U/L (ref 0–37)
BUN: 18 mg/dL (ref 6–23)
CO2: 32 mEq/L (ref 19–32)
Calcium: 10.1 mg/dL (ref 8.4–10.5)
Chloride: 100 mEq/L (ref 96–112)
Creatinine, Ser: 0.71 mg/dL (ref 0.40–1.20)
GFR: 84.29 mL/min (ref >60.00)
Glucose, Bld: 67 mg/dL — ABNORMAL LOW (ref 70–99)
POTASSIUM: 4.4 meq/L (ref 3.5–5.1)
SODIUM: 137 meq/L (ref 135–145)
TOTAL PROTEIN: 7.4 g/dL (ref 6.0–8.3)
Total Bilirubin: 0.5 mg/dL (ref 0.2–1.2)

## 2015-06-13 LAB — POCT URINALYSIS DIPSTICK
BILIRUBIN UA: NEGATIVE
Glucose, UA: NEGATIVE
Ketones, UA: NEGATIVE
Leukocytes, UA: NEGATIVE
NITRITE UA: NEGATIVE
Protein, UA: NEGATIVE
SPEC GRAV UA: 1.015
UROBILINOGEN UA: 0.2
pH, UA: 7

## 2015-06-13 LAB — SEDIMENTATION RATE: SED RATE: 18 mm/h (ref 0–22)

## 2015-06-13 LAB — TSH: TSH: 1.2 u[IU]/mL (ref 0.35–4.50)

## 2015-06-13 MED ORDER — PREDNISONE 20 MG PO TABS: 20.0000 mg | ORAL_TABLET | Freq: Two times a day (BID) | ORAL | Status: DC

## 2015-06-13 NOTE — Progress Notes (Signed)
Pre visit review using our clinic review tool, if applicable. No additional management support is needed unless otherwise documented below in the visit note. 

## 2015-06-13 NOTE — Progress Notes (Signed)
Subjective:    Patient ID: Cindy Schwartz, female    DOB: 1934-05-01, 81 y.o.   MRN: [id number]  HPI  81 year old patient who has a history of osteoarthritis and allergic rhinitis.  She also has a history of intermittent low back pain. She has felt poorly for the past 3 days with fatigue as her most prominent symptom.  She simply feels she has very low energy level.  She is also noticed some mild low back pain as well as some wrist pain.  She does have a history of carpal tunnel syndrome and she feels her wrist pain is similar to carpal tunnel issues in the past.  She has resumed using bilateral wrist splints.  No fever or other constitutional complaints.  Wt Readings from Last 3 Encounters:  06/13/15 143 lb (64.864 kg)  12/27/14 144 lb 6.4 oz (65.499 kg)  12/25/14 145 lb (65.772 kg)    Past Medical History  Diagnosis Date  . Osteoporosis   . Aneurysm of ascending aorta     a. 4.2-4.3cm in 2014.  Marland Kitchen History of ovarian cancer   . Raynaud's disease   . GERD (gastroesophageal reflux disease)   . Insomnia   . Normal coronary arteries Cath - 09/2014  . Elevated BP Transient 09/2014    Social History   Social History  . Marital Status: Married    Spouse Name: N/A  . Number of Children: N/A  . Years of Education: N/A   Occupational History  . retired    Social History Main Topics  . Smoking status: Never Smoker   . Smokeless tobacco: Never Used  . Alcohol Use: No  . Drug Use: No  . Sexual Activity: Not on file   Other Topics Concern  . Not on file   Social History Narrative   She and her husband are from the [place] of [place].`    Past Surgical History  Procedure Laterality Date  . Ovarian ca s/p taj/bso  1979  . Abdominal hysterectomy    . Appendectomy    . Bilateral salpingoophorectomy    . Left heart catheterization with coronary angiogram N/A 10/17/2014    Procedure: LEFT HEART CATHETERIZATION WITH CORONARY ANGIOGRAM;  Surgeon: [name], MD;  Location:  [facility] CATH LAB;  Service: Cardiovascular;  Laterality: N/A;    No family history on file.  No Known Allergies  Current Outpatient Prescriptions on File Prior to Visit  Medication Sig Dispense Refill  . fluticasone (FLONASE) 50 MCG/ACT nasal spray Place 2 sprays into both nostrils daily. 16 g 2  . NEXIUM 40 MG capsule Take 1 capsule (40 mg total) by mouth daily. 90 capsule 3  . VALTREX 500 MG tablet Take 1 tablet (500 mg total) by mouth daily as needed. 30 tablet 5   No current facility-administered medications on file prior to visit.    BP 110/64 mmHg  Pulse 79  Temp(Src) 98.2 F (36.8 C) (Oral)  Resp 20  Ht 5\' 6"  (1.676 m)  Wt 143 lb (64.864 kg)  BMI 23.09 kg/m2  SpO2 98%     Review of Systems  Constitutional: Positive for fatigue.  HENT: Negative for congestion, dental problem, hearing loss, rhinorrhea, sinus pressure, sore throat and tinnitus.   Eyes: Negative for pain, discharge and visual disturbance.  Respiratory: Negative for cough and shortness of breath.   Cardiovascular: Negative for chest pain, palpitations and leg swelling.  Gastrointestinal: Negative for nausea, vomiting, abdominal pain, diarrhea, constipation, blood in stool and abdominal  distention.  Genitourinary: Negative for dysuria, urgency, frequency, hematuria, flank pain, vaginal bleeding, vaginal discharge, difficulty urinating, vaginal pain and pelvic pain.  Musculoskeletal: Positive for back pain and arthralgias. Negative for joint swelling and gait problem.  Skin: Negative for rash.  Neurological: Negative for dizziness, syncope, speech difficulty, weakness, numbness and headaches.  Hematological: Negative for adenopathy.  Psychiatric/Behavioral: Negative for behavioral problems, dysphoric mood and agitation. The patient is not nervous/anxious.        Objective:   Physical Exam  Constitutional: She is oriented to person, place, and time. She appears well-developed and well-nourished. No distress.    Able to easily stand from a sitting position and transferred to the examining table without difficulty  Afebrile.  Blood pressure low normal Weight stable O2 saturation 98%   HENT:  Head: Normocephalic.  Right Ear: External ear normal.  Left Ear: External ear normal.  Mouth/Throat: Oropharynx is clear and moist.  Eyes: Conjunctivae and EOM are normal. Pupils are equal, round, and reactive to light.  Neck: Normal range of motion. Neck supple. No thyromegaly present.  Cardiovascular: Normal rate, regular rhythm, normal heart sounds and intact distal pulses.   Pulmonary/Chest: Effort normal and breath sounds normal.  Abdominal: Soft. Bowel sounds are normal. She exhibits no mass. There is no tenderness.  Musculoskeletal: Normal range of motion.  Lymphadenopathy:    She has no cervical adenopathy.  Neurological: She is alert and oriented to person, place, and time.  Skin: Skin is warm and dry. No rash noted.  Psychiatric: She has a normal mood and affect. Her behavior is normal.          Assessment & Plan:   Fatigue.  Unclear etiology, possible viral syndrome versus flare of allergy.  We'll check screening lab including sedimentation rate.  Will treat with a brief dose of oral prednisone and observe Allergic rhinitis Degenerative joint disease. History of UTI.  Will review a UA

## 2015-06-13 NOTE — Patient Instructions (Signed)
Call or return to clinic prn if these symptoms worsen or fail to improve as anticipated.  Prednisone 20 mg twice daily for 6 days  Call if you develop any new or worsening symptoms

## 2015-06-14 ENCOUNTER — Telehealth: Payer: Self-pay | Admitting: Internal Medicine

## 2015-06-14 NOTE — Telephone Encounter (Signed)
Pt calling for lab results, please advise. 

## 2015-06-14 NOTE — Telephone Encounter (Signed)
Spoke to pt, told her lab work was normal per Dr.K. Pt verbalized understanding.

## 2015-06-14 NOTE — Telephone Encounter (Signed)
Please call/notify patient that lab/test/procedure is normal 

## 2015-06-14 NOTE — Telephone Encounter (Signed)
Pt would like blood work results °

## 2015-06-26 ENCOUNTER — Ambulatory Visit (INDEPENDENT_AMBULATORY_CARE_PROVIDER_SITE_OTHER): Payer: Medicare Other | Admitting: Internal Medicine

## 2015-06-26 ENCOUNTER — Encounter: Payer: Self-pay | Admitting: Internal Medicine

## 2015-06-26 VITALS — BP 126/72 | HR 60 | Temp 97.9°F | Resp 18 | Ht 66.0 in | Wt 145.0 lb

## 2015-06-26 DIAGNOSIS — I200 Unstable angina: Secondary | ICD-10-CM | POA: Diagnosis not present

## 2015-06-26 DIAGNOSIS — M5440 Lumbago with sciatica, unspecified side: Secondary | ICD-10-CM | POA: Diagnosis not present

## 2015-06-26 DIAGNOSIS — R5383 Other fatigue: Secondary | ICD-10-CM

## 2015-06-26 DIAGNOSIS — Z23 Encounter for immunization: Secondary | ICD-10-CM

## 2015-06-26 NOTE — Progress Notes (Signed)
Pre visit review using our clinic review tool, if applicable. No additional management support is needed unless otherwise documented below in the visit note. 

## 2015-06-26 NOTE — Progress Notes (Signed)
Subjective:    Patient ID: Cindy Schwartz, female    DOB: Jul 04, 1934, 81 y.o.   MRN: [id number]  HPI  81 year old patient who is seen   2 weeks ago with some fatigue.  She also had some arthritic symptoms including a flare of her low back pain.  She does have degenerative disc disease.  Her chief complaint was fatigue and this has largely resolved.  She did have a laboratory screen that was unremarkable including sedimentation rate.  She also had a flare of some allergy symptoms and was given a brief prescription for oral prednisone, which she did not take.   Laboratory studies reviewed  Past Medical History  Diagnosis Date  . Osteoporosis   . Aneurysm of ascending aorta ([place])     a. 4.2-4.3cm in 2014.  Marland Kitchen History of ovarian cancer   . Raynaud's disease   . GERD (gastroesophageal reflux disease)   . Insomnia   . Normal coronary arteries Cath - 09/2014  . Elevated BP Transient 09/2014    Social History   Social History  . Marital Status: Married    Spouse Name: N/A  . Number of Children: N/A  . Years of Education: N/A   Occupational History  . retired    Social History Main Topics  . Smoking status: Never Smoker   . Smokeless tobacco: Never Used  . Alcohol Use: No  . Drug Use: No  . Sexual Activity: Not on file   Other Topics Concern  . Not on file   Social History Narrative   She and her husband are from the [place] area of [place].`    Past Surgical History  Procedure Laterality Date  . Ovarian ca s/p taj/bso  1979  . Abdominal hysterectomy    . Appendectomy    . Bilateral salpingoophorectomy    . Left heart catheterization with coronary angiogram N/A 10/17/2014    Procedure: LEFT HEART CATHETERIZATION WITH CORONARY ANGIOGRAM;  Surgeon: [name], MD;  Location: [facility] CATH LAB;  Service: Cardiovascular;  Laterality: N/A;    No family history on file.  No Known Allergies  Current Outpatient Prescriptions on File Prior to Visit  Medication Sig Dispense  Refill  . b complex vitamins tablet Take 1 tablet by mouth daily.    . fluticasone (FLONASE) 50 MCG/ACT nasal spray Place 2 sprays into both nostrils daily. 16 g 2  . NEXIUM 40 MG capsule Take 1 capsule (40 mg total) by mouth daily. 90 capsule 3  . VALTREX 500 MG tablet Take 1 tablet (500 mg total) by mouth daily as needed. 30 tablet 5  . vitamin C (ASCORBIC ACID) 500 MG tablet Take 500 mg by mouth daily.     No current facility-administered medications on file prior to visit.    BP 126/72 mmHg  Pulse 60  Temp(Src) 97.9 F (36.6 C) (Oral)  Resp 18  Ht 5\' 6"  (1.676 m)  Wt 145 lb (65.772 kg)  BMI 23.41 kg/m2     Review of Systems  Constitutional: Positive for fatigue.  HENT: Negative for congestion, dental problem, hearing loss, rhinorrhea, sinus pressure, sore throat and tinnitus.   Eyes: Negative for pain, discharge and visual disturbance.  Respiratory: Negative for cough and shortness of breath.   Cardiovascular: Negative for chest pain, palpitations and leg swelling.  Gastrointestinal: Negative for nausea, vomiting, abdominal pain, diarrhea, constipation, blood in stool and abdominal distention.  Genitourinary: Negative for dysuria, urgency, frequency, hematuria, flank pain, vaginal bleeding, vaginal discharge, difficulty  urinating, vaginal pain and pelvic pain.  Musculoskeletal: Positive for back pain and arthralgias. Negative for joint swelling and gait problem.  Skin: Negative for rash.  Neurological: Negative for dizziness, syncope, speech difficulty, weakness, numbness and headaches.  Hematological: Negative for adenopathy.  Psychiatric/Behavioral: Negative for behavioral problems, dysphoric mood and agitation. The patient is not nervous/anxious.        Objective:   Physical Exam  Constitutional: She is oriented to person, place, and time. She appears well-developed and well-nourished.  HENT:  Head: Normocephalic.  Right Ear: External ear normal.  Left Ear: External  ear normal.  Mouth/Throat: Oropharynx is clear and moist.  Eyes: Conjunctivae and EOM are normal. Pupils are equal, round, and reactive to light.  Neck: Normal range of motion. Neck supple. No thyromegaly present.  Cardiovascular: Normal rate, regular rhythm, normal heart sounds and intact distal pulses.   Pulmonary/Chest: Effort normal and breath sounds normal.  Abdominal: Soft. Bowel sounds are normal. She exhibits no mass. There is no tenderness.  Musculoskeletal: Normal range of motion.  Lymphadenopathy:    She has no cervical adenopathy.  Neurological: She is alert and oriented to person, place, and time.  Skin: Skin is warm and dry. No rash noted.  Psychiatric: She has a normal mood and affect. Her behavior is normal.          Assessment & Plan:    fatigue.  Greatly improved.  Probably viral syndrome  Allergic rhinitis  Osteoarthritis   Return in 6 months for follow-up  Patient will report any new or worsening symptoms

## 2015-06-26 NOTE — Patient Instructions (Signed)
It is important that you exercise regularly, at least 20 minutes 3 to 4 times per week.  If you develop chest pain or shortness of breath seek  medical attention.  Limit your sodium (Salt) intake  Avoids foods high in acid such as tomatoes citrus juices, and spicy foods.  Avoid eating within two hours of lying down or before exercising.  Do not overheat.  Try smaller more frequent meals  Return in 6 months for follow-up

## 2015-07-06 ENCOUNTER — Other Ambulatory Visit: Payer: Self-pay | Admitting: Internal Medicine

## 2015-07-06 MED ORDER — NEXIUM 40 MG PO CPDR: 40.0000 mg | DELAYED_RELEASE_CAPSULE | Freq: Every day | ORAL | Status: DC

## 2015-07-06 NOTE — Telephone Encounter (Signed)
Rx sent to [facility] for Brand Name Only.

## 2015-07-06 NOTE — Telephone Encounter (Addendum)
Pt needs name brand nexium 40 mg #90 w/refills no generic [facility] to [facility] phone # [phone]

## 2015-07-10 ENCOUNTER — Other Ambulatory Visit: Payer: Self-pay

## 2015-07-10 MED ORDER — NEXIUM 40 MG PO CPDR: 40.0000 mg | DELAYED_RELEASE_CAPSULE | Freq: Every day | ORAL | Status: DC

## 2015-07-17 DIAGNOSIS — D485 Neoplasm of uncertain behavior of skin: Secondary | ICD-10-CM | POA: Diagnosis not present

## 2015-07-17 DIAGNOSIS — L2084 Intrinsic (allergic) eczema: Secondary | ICD-10-CM | POA: Diagnosis not present

## 2015-07-17 DIAGNOSIS — D2272 Melanocytic nevi of left lower limb, including hip: Secondary | ICD-10-CM | POA: Diagnosis not present

## 2015-07-17 DIAGNOSIS — Z86018 Personal history of other benign neoplasm: Secondary | ICD-10-CM | POA: Diagnosis not present

## 2015-07-17 DIAGNOSIS — D224 Melanocytic nevi of scalp and neck: Secondary | ICD-10-CM | POA: Diagnosis not present

## 2015-07-17 DIAGNOSIS — D2271 Melanocytic nevi of right lower limb, including hip: Secondary | ICD-10-CM | POA: Diagnosis not present

## 2015-07-17 DIAGNOSIS — D2221 Melanocytic nevi of right ear and external auricular canal: Secondary | ICD-10-CM | POA: Diagnosis not present

## 2015-07-17 DIAGNOSIS — L821 Other seborrheic keratosis: Secondary | ICD-10-CM | POA: Diagnosis not present

## 2015-07-17 DIAGNOSIS — L820 Inflamed seborrheic keratosis: Secondary | ICD-10-CM | POA: Diagnosis not present

## 2015-07-17 DIAGNOSIS — D2230 Melanocytic nevi of unspecified part of face: Secondary | ICD-10-CM | POA: Diagnosis not present

## 2015-07-17 DIAGNOSIS — D2261 Melanocytic nevi of right upper limb, including shoulder: Secondary | ICD-10-CM | POA: Diagnosis not present

## 2015-07-17 DIAGNOSIS — D225 Melanocytic nevi of trunk: Secondary | ICD-10-CM | POA: Diagnosis not present

## 2015-08-31 DIAGNOSIS — H43813 Vitreous degeneration, bilateral: Secondary | ICD-10-CM | POA: Diagnosis not present

## 2015-08-31 DIAGNOSIS — H52203 Unspecified astigmatism, bilateral: Secondary | ICD-10-CM | POA: Diagnosis not present

## 2015-08-31 DIAGNOSIS — Z961 Presence of intraocular lens: Secondary | ICD-10-CM | POA: Diagnosis not present

## 2015-09-18 DIAGNOSIS — N644 Mastodynia: Secondary | ICD-10-CM | POA: Diagnosis not present

## 2015-09-21 ENCOUNTER — Telehealth: Payer: Self-pay | Admitting: Cardiovascular Disease

## 2015-09-21 DIAGNOSIS — R002 Palpitations: Secondary | ICD-10-CM

## 2015-09-21 NOTE — Telephone Encounter (Signed)
New Message  Pt has questions concerning her follow up- to discuss palpitations. Please call back and discuss.

## 2015-09-21 NOTE — Telephone Encounter (Addendum)
Patient called c/o "heart racing."  It is not a new problem, but worsened 10 days ago when her friend died. Prior to this event, she could "hear her heartbeat." Now she cannot hear it, but can feel it in her chest. She has no other symptoms, but is extremely anxious. Her most recent BP taken last week was 127/(unknown). Instructed the patient how to count her HR on the phone. For one minute, her HR was 80 and sounded regular (per numbers counted aloud and per patient report). Reviewed with Cecilie Kicks, who recommended a 48 hour holter placed prior to visit 1/9. Instructed patient to go to ED in the meantime if symptoms worsen or HR sustains over 120.  Patient agrees with treatment plan.

## 2015-09-25 ENCOUNTER — Ambulatory Visit (INDEPENDENT_AMBULATORY_CARE_PROVIDER_SITE_OTHER): Payer: Medicare Other

## 2015-09-25 DIAGNOSIS — R002 Palpitations: Secondary | ICD-10-CM

## 2015-10-01 ENCOUNTER — Ambulatory Visit
Admission: RE | Admit: 2015-10-01 | Discharge: 2015-10-01 | Disposition: A | Discharge status: Home / Self Care | Payer: Medicare Other | Source: Ambulatory Visit | Attending: Cardiology | Admitting: Cardiology

## 2015-10-01 ENCOUNTER — Encounter: Payer: Self-pay | Admitting: Cardiology

## 2015-10-01 ENCOUNTER — Ambulatory Visit (INDEPENDENT_AMBULATORY_CARE_PROVIDER_SITE_OTHER): Payer: Medicare Other | Admitting: Cardiology

## 2015-10-01 VITALS — BP 126/80 | HR 66 | Ht 67.0 in | Wt 147.8 lb

## 2015-10-01 DIAGNOSIS — R062 Wheezing: Secondary | ICD-10-CM

## 2015-10-01 DIAGNOSIS — R002 Palpitations: Secondary | ICD-10-CM | POA: Diagnosis not present

## 2015-10-01 LAB — COMPREHENSIVE METABOLIC PANEL
ALBUMIN: 4.1 g/dL (ref 3.6–5.1)
ALK PHOS: 51 U/L (ref 33–130)
ALT: 15 U/L (ref 6–29)
AST: 21 U/L (ref 10–35)
BUN: 17 mg/dL (ref 7–25)
CALCIUM: 9.7 mg/dL (ref 8.6–10.4)
CHLORIDE: 98 mmol/L (ref 98–110)
CO2: 26 mmol/L (ref 20–31)
Creat: 0.76 mg/dL (ref 0.60–0.93)
Glucose, Bld: 92 mg/dL (ref 65–99)
POTASSIUM: 4.4 mmol/L (ref 3.5–5.3)
Sodium: 133 mmol/L — ABNORMAL LOW (ref 135–146)
TOTAL PROTEIN: 7.2 g/dL (ref 6.1–8.1)
Total Bilirubin: 0.4 mg/dL (ref 0.2–1.2)

## 2015-10-01 LAB — CBC
HEMATOCRIT: 39.2 % (ref 36.0–46.0)
Hemoglobin: 12.9 g/dL (ref 12.0–15.0)
MCH: 27.9 pg (ref 26.0–34.0)
MCHC: 32.9 g/dL (ref 30.0–36.0)
MCV: 84.8 fL (ref 78.0–100.0)
MPV: 11.8 fL (ref 8.6–12.4)
Platelets: 218 10*3/uL (ref 150–400)
RBC: 4.62 MIL/uL (ref 3.87–5.11)
RDW: 14.2 % (ref 11.5–15.5)
WBC: 8.3 10*3/uL (ref 4.0–10.5)

## 2015-10-01 NOTE — Progress Notes (Signed)
Cardiology Office Note   Date:  10/01/2015   ID:  Cindy Schwartz, DOB 07-09-1934, MRN [id number]  PCP:  Nyoka Cowden, MD  Cardiologist:  Dr. Johnsie Cancel     Chief Complaint  Patient presents with  . Palpitations    denies shortness of breath, le edema  +chest discomfort      History of Present Illness: Cindy Schwartz is a 82 y.o. female who presents for racing HR.  Results of 48 hour holter.   She has a history of stable fusiform dilatation of aortic root 4.2 cm Patient was admitted to the hospital last year in Jan 2016 with chest pain associated with blood pressures of 171/109 which was unusual for her. Cardiac catheterization showed normal coronary arteries, normal LV function and mild to moderate dilatation of the ascending aorta without evidence of dissection. Coreg was recommended for blood pressure control. She also has a history of a thoracic aortic aneurysm last measured in 2014 at 4.2-4.3 cm followed by Dr. [name]. She is scheduled for an MRA on 11/29/14. Event monitor was neg last year  Recently she called with palpitations and 48 hour monitor was placed.  NSR with PACs/PVCs and 8 beat run of SVT.   Today: no chest pain or SOB- she relates she has had the death of a friend she is dealing with.  Her EKG is stable.  She does complain of congestion but not much cough, no fever.  Her palpitations have improved.    Past Medical History  Diagnosis Date  . Osteoporosis   . Aneurysm of ascending aorta ([place])     a. 4.2-4.3cm in 2014.  Marland Kitchen History of ovarian cancer   . Raynaud's disease   . GERD (gastroesophageal reflux disease)   . Insomnia   . Normal coronary arteries Cath - 09/2014  . Elevated BP Transient 09/2014    Past Surgical History  Procedure Laterality Date  . Ovarian ca s/p taj/bso  1979  . Abdominal hysterectomy    . Appendectomy    . Bilateral salpingoophorectomy    . Left heart catheterization with coronary angiogram N/A 10/17/2014    Procedure: LEFT HEART  CATHETERIZATION WITH CORONARY ANGIOGRAM;  Surgeon: [name], MD;  Location: [facility] CATH LAB;  Service: Cardiovascular;  Laterality: N/A;     Current Outpatient Prescriptions  Medication Sig Dispense Refill  . b complex vitamins tablet Take 1 tablet by mouth daily.    . fluticasone (FLONASE) 50 MCG/ACT nasal spray Place 2 sprays into both nostrils daily. 16 g 2  . NEXIUM 40 MG capsule Take 1 capsule (40 mg total) by mouth daily. 90 capsule 1  . valACYclovir (VALTREX) 500 MG tablet Take 500 mg by mouth once a week.    . vitamin C (ASCORBIC ACID) 500 MG tablet Take 500 mg by mouth daily.     No current facility-administered medications for this visit.    Allergies:   Review of patient's allergies indicates no known allergies.    Social History:  The patient  reports that she has never smoked. She has never used smokeless tobacco. She reports that she does not drink alcohol or use illicit drugs.   Family History:  The patient's family history includes Healthy in her brother and mother.  Her mother died at 70 from old age. No previous medical problems.  Her father was POW and died at 74 with war wounds.   ROS:  General:no colds or fevers, no weight changes Skin:no rashes or ulcers HEENT:no blurred  vision, no congestion CV:see HPI PUL:see HPI GI:no diarrhea constipation or melena, no indigestion GU:no hematuria, no dysuria MS:no joint pain, no claudication Neuro:no syncope, no lightheadedness Endo:no diabetes, no thyroid disease  Wt Readings from Last 3 Encounters:  10/01/15 147 lb 12.8 oz (67.042 kg)  06/26/15 145 lb (65.772 kg)  06/13/15 143 lb (64.864 kg)     PHYSICAL EXAM: VS:  BP 126/80 mmHg  Pulse 66  Ht 5\' 7"  (1.702 m)  Wt 147 lb 12.8 oz (67.042 kg)  BMI 23.14 kg/m2 , BMI Body mass index is 23.14 kg/(m^2). General:Pleasant affect, NAD Skin:Warm and dry, brisk capillary refill HEENT:normocephalic, sclera clear, mucus membranes moist Neck:supple, no JVD, no bruits    Heart:S1S2 RRR without murmur, gallup, rub or click Lungs: without rales, rhonchi,  occ wheezes [id number], non tender, + BS, do not palpate liver spleen or masses Ext:no lower ext edema, 2+ pedal pulses, 2+ radial pulses Neuro:alert and oriented X 3, MAE, follows commands, + facial symmetry    EKG:  EKG is ordered today. The ekg ordered today demonstrates SR with RBBB, no changes from 2016.    Recent Labs: 06/13/2015: ALT 12; BUN 18; Creatinine, Ser 0.71; Hemoglobin 13.6; Platelets 218.0; Potassium 4.4; Sodium 137; TSH 1.20    Lipid Panel    Component Value Date/Time   CHOL 175 10/17/2014 0450   TRIG 23 10/17/2014 0450   HDL 63 10/17/2014 0450   CHOLHDL 2.8 10/17/2014 0450   VLDL 5 10/17/2014 0450   LDLCALC 107* 10/17/2014 0450   LDLDIRECT 127.5 12/20/2012 0905       Other studies Reviewed: Additional studies/ records that were reviewed today include: Marland Kitchen MRA 11/29/14 1. Continued stability of fusiform aneurysmal dilatation of the tubular portion of the ascending thoracic aorta. Maximal transverse diameter is 4.2 cm.  ANGIOGRAPHY: 10/17/14   Left main: Upward takeoff vessel that was long and angiographically normal and trifurcated into the LAD, ramus intermediate, and left circumflex coronary artery.  LAD: Angiographically normal and gave rise to several septal perforating arteries and one major bifurcating diagonal branch.  Ramus Intermediate: Angiographically normal moderate sized vessel.  Left circumflex: Angiographically normal vessel.  Right coronary artery: Normal vessel which ended in the PLA-like branch.   Left ventriculography revealed normal LV systolic function with an ejection fraction of approximately 60%.  Supra valvular Aortagraphy revealed a dilated ascending aorta. There was no evidence for dissection. There was no evidence for aortic insufficiency.  IMPRESSION:  Normal LV function.  Normal coronary arteries.  Mild to moderate dilatation of  the ascending aorta without evidence for dissection.  Event monitor reviewed: just benign PVC;s no N/SVT or PAF  ASSESSMENT AND PLAN:  1.  palpitations with PACs and PVCs short burst of SVT on 48 hour monitor.   She has been stressed aobut the death of a friend.   If they return we could do prn BB.  Also she will see PCP for an anxiety component. Will check BMP and CBC  She will keep follow up with Dr. Johnsie Cancel in April.   2.  URI with occ wheezes  Will check CXR. no Fever or productive cough.     Current medicines are reviewed with the patient today.  The patient Has no concerns regarding medicines.  The following changes have been made:  See above Labs/ tests ordered today include:see above  Disposition:   FU:  see above  Signed, Isaiah Serge, NP  10/01/2015 2:29 PM    [facility]  [address], [place], [place], [place] Phone: [phone]; Fax: [phone]

## 2015-10-01 NOTE — Patient Instructions (Signed)
Medication Instructions:  None  Labwork: CBC, CMP today  Testing/Procedures: Your physician would like for you to have a chest xray completed for wheezing.    Follow-Up: Your physician recommends that you schedule a follow-up appointment in: April with Dr. Johnsie Cancel.  Any Other Special Instructions Will Be Listed Below (If Applicable).     If you need a refill on your cardiac medications before your next appointment, please call your pharmacy.

## 2015-10-02 ENCOUNTER — Telehealth: Payer: Self-pay

## 2015-10-02 NOTE — Telephone Encounter (Signed)
Patient returned call. Patient stated her palpitations are rare and are not bothersome at this time. Encouraged patient to call if palpitations start being bothersome and we could start her on bystolic 5 mg daily. Patient verbalized understanding and will call with any other concerns.

## 2015-10-02 NOTE — Telephone Encounter (Signed)
Left message for patient to call back. Holter monitor completed. Per Dr. Johnsie Cancel, if palpitations still bothersome can call in bystolic 5 mg daily  If not ok to observe.

## 2015-10-24 ENCOUNTER — Telehealth: Payer: Self-pay | Admitting: Internal Medicine

## 2015-10-24 NOTE — Telephone Encounter (Signed)
Pt needs PA nexium 40 mg name brand only please call [facility] [phone]

## 2015-10-25 NOTE — Telephone Encounter (Signed)
PA submitted - will await determination.

## 2015-10-31 ENCOUNTER — Telehealth: Payer: Self-pay | Admitting: Internal Medicine

## 2015-10-31 NOTE — Telephone Encounter (Signed)
Left message on voicemail to call office.  

## 2015-10-31 NOTE — Telephone Encounter (Signed)
Patient called again.  She would like a return call before LPN leaves for the day.

## 2015-10-31 NOTE — Telephone Encounter (Signed)
Patient returned LPN's telephone call.  Patient said she will be at home waiting for the return telephone call.

## 2015-10-31 NOTE — Telephone Encounter (Signed)
The request for Nexium to be covered under insurance is denied. Nexium is excluded from coverage under her plan.

## 2015-11-01 NOTE — Telephone Encounter (Signed)
Spoke to pt, told her Nexium was denied by insurance and if she wants to change needs to fine out what is on her formulary that they will cover. Pt verbalized understanding and stated that she received a letter and she will let me know. Told her okay.

## 2015-11-09 DIAGNOSIS — K639 Disease of intestine, unspecified: Secondary | ICD-10-CM | POA: Diagnosis not present

## 2015-11-09 DIAGNOSIS — K219 Gastro-esophageal reflux disease without esophagitis: Secondary | ICD-10-CM | POA: Diagnosis not present

## 2015-11-09 DIAGNOSIS — R1012 Left upper quadrant pain: Secondary | ICD-10-CM | POA: Diagnosis not present

## 2015-12-18 ENCOUNTER — Other Ambulatory Visit: Payer: Self-pay | Admitting: Nurse Practitioner

## 2015-12-18 DIAGNOSIS — N644 Mastodynia: Secondary | ICD-10-CM

## 2015-12-24 ENCOUNTER — Ambulatory Visit
Admission: RE | Admit: 2015-12-24 | Discharge: 2015-12-24 | Disposition: A | Discharge status: Home / Self Care | Payer: Medicare Other | Source: Ambulatory Visit | Attending: Nurse Practitioner | Admitting: Nurse Practitioner

## 2015-12-24 ENCOUNTER — Other Ambulatory Visit: Payer: Self-pay | Admitting: Nurse Practitioner

## 2015-12-24 DIAGNOSIS — N644 Mastodynia: Secondary | ICD-10-CM

## 2015-12-24 DIAGNOSIS — N6310 Unspecified lump in the right breast, unspecified quadrant: Secondary | ICD-10-CM

## 2015-12-26 ENCOUNTER — Ambulatory Visit (INDEPENDENT_AMBULATORY_CARE_PROVIDER_SITE_OTHER): Payer: Medicare Other | Admitting: Internal Medicine

## 2015-12-26 ENCOUNTER — Encounter: Payer: Self-pay | Admitting: Internal Medicine

## 2015-12-26 VITALS — BP 130/80 | HR 75 | Temp 98.4°F | Resp 20 | Ht 66.0 in | Wt 144.0 lb

## 2015-12-26 DIAGNOSIS — I712 Thoracic aortic aneurysm, without rupture, unspecified: Secondary | ICD-10-CM

## 2015-12-26 DIAGNOSIS — J3089 Other allergic rhinitis: Secondary | ICD-10-CM

## 2015-12-26 DIAGNOSIS — I7300 Raynaud's syndrome without gangrene: Secondary | ICD-10-CM

## 2015-12-26 DIAGNOSIS — Z0000 Encounter for general adult medical examination without abnormal findings: Secondary | ICD-10-CM | POA: Diagnosis not present

## 2015-12-26 DIAGNOSIS — M810 Age-related osteoporosis without current pathological fracture: Secondary | ICD-10-CM | POA: Diagnosis not present

## 2015-12-26 LAB — LIPID PANEL
Cholesterol: 209 mg/dL — ABNORMAL HIGH (ref 0–200)
HDL: 76.5 mg/dL (ref >39.00)
LDL Cholesterol: 124 mg/dL — ABNORMAL HIGH (ref 0–99)
NonHDL: 132.63
Total CHOL/HDL Ratio: 3
Triglycerides: 45 mg/dL (ref 0.0–149.0)
VLDL: 9 mg/dL (ref 0.0–40.0)

## 2015-12-26 MED ORDER — NEXIUM 40 MG PO CPDR: 40.0000 mg | DELAYED_RELEASE_CAPSULE | Freq: Every day | ORAL | Status: DC

## 2015-12-26 MED ORDER — LORAZEPAM 0.5 MG PO TABS: 0.5000 mg | ORAL_TABLET | Freq: Two times a day (BID) | ORAL | Status: DC | PRN

## 2015-12-26 NOTE — Progress Notes (Signed)
Pre visit review using our clinic review tool, if applicable. No additional management support is needed unless otherwise documented below in the visit note. 

## 2015-12-26 NOTE — Progress Notes (Signed)
Subjective:    Patient ID: Cindy Schwartz, female    DOB: 1934/06/09, 82 y.o.   MRN: [id number]  HPI   72  -year-old patient who is seen today for an annual health examination. She enjoys excellent health.  She does have a history of an aneurysm of the descending thoracic aorta which has been stable.  She has been receiving imaging studies on a two-year basis and this has been quite stable.  She has a history of osteoporosis and allergic rhinitis.  She has gastroesophageal reflux disease, and remains on chronic PPI therapy.  She was hospitalized in January for evaluation of chest pain that included a heart catheterization and 2-D echocardiogram. She has been treated with Fosamax for a number of years in the past. Statin therapy was discussed and she wishes to defer  She has been under considerable situational stress.  She is scheduled for a breast biopsy in 2 days for a suspicious lesion involving the right breast. Her husband is undergoing a lung biopsy tomorrow for a lesion involving the left lower lung associated with mediastinal adenopathy   Past Medical History  Diagnosis Date  . Osteoporosis   . Aneurysm of ascending aorta ([place])     a. 4.2-4.3cm in 2014.  Marland Kitchen History of ovarian cancer   . Raynaud's disease   . GERD (gastroesophageal reflux disease)   . Insomnia   . Normal coronary arteries Cath - 09/2014  . Elevated BP Transient 09/2014    Social History   Social History  . Marital Status: Married    Spouse Name: N/A  . Number of Children: N/A  . Years of Education: N/A   Occupational History  . retired    Social History Main Topics  . Smoking status: Never Smoker   . Smokeless tobacco: Never Used  . Alcohol Use: No  . Drug Use: No  . Sexual Activity: Not on file   Other Topics Concern  . Not on file   Social History Narrative   She and her husband are from the [place].`    Past Surgical History  Procedure Laterality Date  . Ovarian ca s/p  taj/bso  1979  . Abdominal hysterectomy    . Appendectomy    . Bilateral salpingoophorectomy    . Left heart catheterization with coronary angiogram N/A 10/17/2014    Procedure: LEFT HEART CATHETERIZATION WITH CORONARY ANGIOGRAM;  Surgeon: [name], MD;  Location[facility] CATH LAB;  Service: Cardiovascular;  Laterality: N/A;    Family History  Problem Relation Age of Onset  . Healthy Mother   . Healthy Brother     No Known Allergies  Current Outpatient Prescriptions on File Prior to Visit  Medication Sig Dispense Refill  . b complex vitamins tablet Take 1 tablet by mouth daily.    . valACYclovir (VALTREX) 500 MG tablet Take 500 mg by mouth once a week.     No current facility-administered medications on file prior to visit.    BP 130/80 mmHg  Pulse 75  Temp(Src) 98.4 F (36.9 C) (Oral)  Resp 20  Ht 5\' 6"  (1.676 m)  Wt 144 lb (65.318 kg)  BMI 23.25 kg/m2  SpO2 99%  1. Risk factors, based on past  M,S,F history.  Cardiac risk factors include a history of aneurysm of the thoracic aorta.  She has normal coronary arteries based on catheterization in January 2016  2.  Physical activities: No regular exercise program but remains very active with house  chores  3.  Depression/mood: No history of major depression, does complain of weakness of about 4 weeks duration  4.  Hearing: No deficits  5.  ADL's: Independent in all aspects of daily living  6.  Fall risk: Low  7.  Home safety: No problems identified  8.  Height weight, and visual acuity; height and weight stable no change in visual acuity does have annual eye examinations   9.  Counseling: More rigorous exercise regimen encouraged  10. Lab orders based on risk factors: Hospital records and lab reviewed from January 2016 all unremarkable including lipid profile and TSH  11. Referral : Follow-up vascular surgeon.  Annually  12. Care plan: We'll continue heart healthy diet.  We'll attempt to increase exercise  regimen  13. Cognitive assessment: Alert in order with normal affect.  No cognitive dysfunction  14. Screening: We'll continue annual health exams with screening lab.  We'll continue to screen for her thoracic aortic aneurysm.  Patient was provided with a written and personalized care plan  15. Provider List Update:  Vascular surgery primary care medicine .  GYN and radiology     Review of Systems  Constitutional: Negative.   HENT: Positive for rhinorrhea. Negative for congestion, dental problem, hearing loss, sinus pressure, sore throat and tinnitus.   Eyes: Negative for pain, discharge and visual disturbance.  Respiratory: Negative for cough and shortness of breath.   Cardiovascular: Negative for chest pain, palpitations and leg swelling.  Gastrointestinal: Negative for nausea, vomiting, abdominal pain, diarrhea, constipation, blood in stool and abdominal distention.  Genitourinary: Negative for dysuria, urgency, frequency, hematuria, flank pain, vaginal bleeding, vaginal discharge, difficulty urinating, vaginal pain and pelvic pain.  Musculoskeletal: Negative for joint swelling, arthralgias and gait problem.  Skin: Negative for rash.  Neurological: Negative for dizziness, syncope, speech difficulty, weakness, numbness and headaches.  Hematological: Negative for adenopathy.  Psychiatric/Behavioral: Negative for behavioral problems, dysphoric mood and agitation. The patient is not nervous/anxious.        Objective:   Physical Exam  Constitutional: She is oriented to person, place, and time. She appears well-developed and well-nourished.  HENT:  Head: Normocephalic.  Right Ear: External ear normal.  Left Ear: External ear normal.  Mouth/Throat: Oropharynx is clear and moist.  Eyes: Conjunctivae and EOM are normal. Pupils are equal, round, and reactive to light.  Neck: Normal range of motion. Neck supple. No thyromegaly present.  Cardiovascular: Normal rate, regular rhythm and  normal heart sounds.   Decreased pedal pulses on the left  Pulmonary/Chest: Effort normal and breath sounds normal.  Abdominal: Soft. Bowel sounds are normal. She exhibits no mass. There is no tenderness.  Midline and right lower quadrant abdominal scars  Musculoskeletal: Normal range of motion.  Lymphadenopathy:    She has no cervical adenopathy.  Neurological: She is alert and oriented to person, place, and time.  Skin: Skin is warm and dry. No rash noted.  Psychiatric: She has a normal mood and affect. Her behavior is normal.          Assessment & Plan:   Osteoarthritis stable.  Allergic rhinitis History of thoracic aortic aneurysm Osteoporosis.  Calcium vitamin D recommended Right breast mass.  Biopsy scheduled in 2 days Situational stress.  New prescription for lorazepam, dispensed    Medicines updated CPX 6 months  Statin therapy discussed.  Patient wishes to defer

## 2015-12-26 NOTE — Patient Instructions (Signed)
.  Take a calcium supplement, plus [phone] units of vitamin D  Return in 6 months for follow-up

## 2015-12-27 ENCOUNTER — Other Ambulatory Visit: Payer: Self-pay | Admitting: Nurse Practitioner

## 2015-12-27 DIAGNOSIS — N6310 Unspecified lump in the right breast, unspecified quadrant: Secondary | ICD-10-CM

## 2015-12-28 ENCOUNTER — Other Ambulatory Visit: Payer: Self-pay | Admitting: Nurse Practitioner

## 2015-12-28 ENCOUNTER — Ambulatory Visit
Admission: RE | Admit: 2015-12-28 | Discharge: 2015-12-28 | Disposition: A | Discharge status: Home / Self Care | Payer: Medicare Other | Source: Ambulatory Visit | Attending: Nurse Practitioner | Admitting: Nurse Practitioner

## 2015-12-28 DIAGNOSIS — N6310 Unspecified lump in the right breast, unspecified quadrant: Secondary | ICD-10-CM

## 2016-01-01 ENCOUNTER — Telehealth: Payer: Self-pay | Admitting: Internal Medicine

## 2016-01-01 DIAGNOSIS — D0592 Unspecified type of carcinoma in situ of left breast: Secondary | ICD-10-CM

## 2016-01-01 NOTE — Telephone Encounter (Signed)
Pt would like referral to Annia Belt MD Address: [address], [place], [place] [address]  Phone: [phone]   Pt would like to see this md for her newly dx cancer.

## 2016-01-01 NOTE — Telephone Encounter (Signed)
Okay to send referral? See message.

## 2016-01-01 NOTE — Telephone Encounter (Signed)
Spoke to pt, told her order for referral to Oncologist she requested was done and someone will be in contact with her regarding an appt. Pt verbalized understanding.

## 2016-01-01 NOTE — Telephone Encounter (Signed)
Okay for referral?

## 2016-01-03 ENCOUNTER — Encounter: Payer: Self-pay | Admitting: Hematology and Oncology

## 2016-01-03 ENCOUNTER — Telehealth: Payer: Self-pay | Admitting: Hematology and Oncology

## 2016-01-03 NOTE — Telephone Encounter (Signed)
Spoke with patient re new patient appointment with Dr. Lindi Adie 01/16/2016 @ 3:45 pm to arrive 3:15 pm. Patient demographic and insurance information confirmed.

## 2016-01-07 ENCOUNTER — Encounter: Payer: Self-pay | Admitting: Cardiovascular Disease

## 2016-01-07 ENCOUNTER — Ambulatory Visit (INDEPENDENT_AMBULATORY_CARE_PROVIDER_SITE_OTHER): Payer: Medicare Other | Admitting: Cardiovascular Disease

## 2016-01-07 VITALS — BP 118/68 | HR 80 | Ht 66.0 in | Wt 146.0 lb

## 2016-01-07 DIAGNOSIS — R002 Palpitations: Secondary | ICD-10-CM | POA: Diagnosis not present

## 2016-01-07 MED ORDER — PROPRANOLOL HCL 10 MG PO TABS: 10.0000 mg | ORAL_TABLET | ORAL | Status: DC | PRN

## 2016-01-07 NOTE — Patient Instructions (Addendum)
Medication Instructions:  Your physician has recommended you make the following change in your medication:  1-Inderal 10 mg by mouth as needed for palpitations.   Labwork: NONE  Testing/Procedures: NONE  Follow-Up: Your physician wants you to follow-up in: 6 months with Dr. Johnsie Cancel.  You will receive a reminder letter in the mail two months in advance. If you don't receive a letter, please call our office to schedule the follow-up appointment.  If you need a refill on your cardiac medications before your next appointment, please call your pharmacy.

## 2016-01-07 NOTE — Progress Notes (Signed)
Patient ID: Cindy Schwartz, female   DOB: 1934-02-08, 82 y.o.   MRN: [id number]    Cardiology Office Note   Date:  01/07/2016   ID:  Cindy Schwartz, DOB Mar 25, 1934, MRN [id number]  PCP:  Cindy Cowden, MD  Cardiologist:  Dr. Johnsie Schwartz     No chief complaint on file.     History of Present Illness: Cindy Schwartz is a 82 y.o. female who presents for f/u palpitations   She has a history of stable fusiform dilatation of aortic root 4.2 cm Patient was admitted to the hospital last year in Jan 2016 with chest pain associated with blood pressures of 171/109 which was unusual for her. Cardiac catheterization showed normal coronary arteries, normal LV function and mild to moderate dilatation of the ascending aorta without evidence of dissection. Coreg was recommended for blood pressure control. She also has a history of a thoracic aortic aneurysm last measured in 2014 at 4.2-4.3 cm followed by Dr. Cyndia Schwartz. She is scheduled for an MRA on 11/29/14. Event monitor was neg last year  Palpitations and 48 hour monitor was placed.  NSR with PACs/PVCs and 8 beat run of SVT.   Has had a lot of stress recently Husband diagnosed by me with lung cancer and she was just diagnosed with breast cancer   Past Medical History  Diagnosis Date  . Osteoporosis   . Aneurysm of ascending aorta ([place])     a. 4.2-4.3cm in 2014.  Marland Kitchen History of ovarian cancer   . Raynaud's disease   . GERD (gastroesophageal reflux disease)   . Insomnia   . Normal coronary arteries Cath - 09/2014  . Elevated BP Transient 09/2014    Past Surgical History  Procedure Laterality Date  . Ovarian ca s/p taj/bso  1979  . Abdominal hysterectomy    . Appendectomy    . Bilateral salpingoophorectomy    . Left heart catheterization with coronary angiogram N/A 10/17/2014    Procedure: LEFT HEART CATHETERIZATION WITH CORONARY ANGIOGRAM;  Surgeon: Cindy Sine, MD;  Location: [facility] CATH LAB;  Service: Cardiovascular;  Laterality: N/A;     Current  Outpatient Prescriptions  Medication Sig Dispense Refill  . b complex vitamins tablet Take 1 tablet by mouth daily.    . cetirizine (ZYRTEC) 10 MG tablet Take 10 mg by mouth daily.    Marland Kitchen LORazepam (ATIVAN) 0.5 MG tablet Take 1 tablet (0.5 mg total) by mouth 2 (two) times daily as needed for anxiety. 60 tablet 1  . NEXIUM 40 MG capsule Take 1 capsule (40 mg total) by mouth daily. 90 capsule 3  . valACYclovir (VALTREX) 500 MG tablet Take 500 mg by mouth once a week.     No current facility-administered medications for this visit.    Allergies:   Review of patient's allergies indicates no known allergies.    Social History:  The patient  reports that she has never smoked. She has never used smokeless tobacco. She reports that she does not drink alcohol or use illicit drugs.   Family History:  The patient's family history includes Cindy Schwartz in her brother and mother.  Her mother died at 61 from old age. No previous medical problems.  Her father was POW and died at 57 with war wounds.   ROS:  General:no colds or fevers, no weight changes Skin:no rashes or ulcers HEENT:no blurred vision, no congestion CV:see HPI PUL:see HPI GI:no diarrhea constipation or melena, no indigestion GU:no hematuria, no dysuria MS:no joint pain, no claudication Neuro:no syncope,  no lightheadedness Endo:no diabetes, no thyroid disease  Wt Readings from Last 3 Encounters:  01/07/16 66.225 kg (146 lb)  12/26/15 65.318 kg (144 lb)  10/01/15 67.042 kg (147 lb 12.8 oz)     PHYSICAL EXAM: VS:  BP 118/68 mmHg  Pulse 80  Ht 5\' 6"  (1.676 m)  Wt 66.225 kg (146 lb)  BMI 23.58 kg/m2 , BMI Body mass index is 23.58 kg/(m^2). General:Pleasant affect, NAD Skin:Warm and dry, brisk capillary refill HEENT:normocephalic, sclera clear, mucus membranes moist Neck:supple, no JVD, no bruits  Heart:S1S2 RRR without murmur, gallup, rub or click Lungs: without rales, rhonchi,  occ wheezes [id number], non tender, + BS, do not  palpate liver spleen or masses Ext:no lower ext edema, 2+ pedal pulses, 2+ radial pulses Neuro:alert and oriented X 3, MAE, follows commands, + facial symmetry    EKG:   SR with RBBB, no changes from 2016.    Recent Labs: 06/13/2015: TSH 1.20 10/01/2015: ALT 15; BUN 17; Creat 0.76; Hemoglobin 12.9; Platelets 218; Potassium 4.4; Sodium 133*    Lipid Panel    Component Value Date/Time   CHOL 209* 12/26/2015 0947   TRIG 45.0 12/26/2015 0947   HDL 76.50 12/26/2015 0947   CHOLHDL 3 12/26/2015 0947   VLDL 9.0 12/26/2015 0947   LDLCALC 124* 12/26/2015 0947   LDLDIRECT 127.5 12/20/2012 0905       Other studies Reviewed: Additional studies/ records that were reviewed today include: Marland Kitchen MRA 11/29/14 1. Continued stability of fusiform aneurysmal dilatation of the tubular portion of the ascending thoracic aorta. Maximal transverse diameter is 4.2 cm.  ANGIOGRAPHY: 10/17/14   Left main: Upward takeoff vessel that was long and angiographically normal and trifurcated into the LAD, ramus intermediate, and left circumflex coronary artery.  LAD: Angiographically normal and gave rise to several septal perforating arteries and one major bifurcating diagonal branch.  Ramus Intermediate: Angiographically normal moderate sized vessel.  Left circumflex: Angiographically normal vessel.  Right coronary artery: Normal vessel which ended in the PLA-like branch.   Left ventriculography revealed normal LV systolic function with an ejection fraction of approximately 60%.  Supra valvular Aortagraphy revealed a dilated ascending aorta. There was no evidence for dissection. There was no evidence for aortic insufficiency.  IMPRESSION:  Normal LV function.  Normal coronary arteries.  Mild to moderate dilatation of the ascending aorta without evidence for dissection.  Event monitor reviewed: just benign PVC;s no N/SVT or PAF  ASSESSMENT AND PLAN:  1.  palpitations with PACs and PVCs short  burst of SVT on 48 hour monitor.   She has been stressed . If they return we could do prn BB.  Also she will see PCP for an anxiety component.    2.  Breast Cancer  Has appt with Dr Cindy Schwartz will likely need lumpectomy    3. Anxiety has PRN lorazapam and inderal for her palpitations     Jenkins Rouge

## 2016-01-08 ENCOUNTER — Encounter: Payer: Self-pay | Admitting: *Deleted

## 2016-01-11 ENCOUNTER — Other Ambulatory Visit: Payer: Self-pay | Admitting: General Surgery

## 2016-01-11 DIAGNOSIS — C50911 Malignant neoplasm of unspecified site of right female breast: Secondary | ICD-10-CM

## 2016-01-14 ENCOUNTER — Ambulatory Visit
Admission: RE | Admit: 2016-01-14 | Discharge: 2016-01-14 | Disposition: A | Discharge status: Home / Self Care | Payer: Medicare Other | Source: Ambulatory Visit | Attending: General Surgery | Admitting: General Surgery

## 2016-01-14 DIAGNOSIS — C50911 Malignant neoplasm of unspecified site of right female breast: Secondary | ICD-10-CM

## 2016-01-14 MED ORDER — GADOBENATE DIMEGLUMINE 529 MG/ML IV SOLN
13.0000 mL | Freq: Once | INTRAVENOUS | Status: AC | PRN
  Administered 2016-01-14: 13 mL via INTRAVENOUS

## 2016-01-16 ENCOUNTER — Ambulatory Visit (HOSPITAL_BASED_OUTPATIENT_CLINIC_OR_DEPARTMENT_OTHER): Payer: Medicare Other | Admitting: Hematology and Oncology

## 2016-01-16 ENCOUNTER — Encounter: Payer: Self-pay | Admitting: Hematology and Oncology

## 2016-01-16 ENCOUNTER — Encounter: Payer: Self-pay | Admitting: *Deleted

## 2016-01-16 VITALS — BP 127/83 | HR 73 | Temp 98.2°F | Resp 18 | Wt 148.0 lb

## 2016-01-16 DIAGNOSIS — C50511 Malignant neoplasm of lower-outer quadrant of right female breast: Secondary | ICD-10-CM

## 2016-01-16 DIAGNOSIS — Z853 Personal history of malignant neoplasm of breast: Secondary | ICD-10-CM | POA: Insufficient documentation

## 2016-01-16 DIAGNOSIS — D0511 Intraductal carcinoma in situ of right breast: Secondary | ICD-10-CM

## 2016-01-16 DIAGNOSIS — Z171 Estrogen receptor negative status [ER-]: Secondary | ICD-10-CM

## 2016-01-16 NOTE — Progress Notes (Signed)
Unable to get in to exam room prior to MD.  No assessment performed.  

## 2016-01-16 NOTE — Progress Notes (Signed)
Met with patient today with new patient visit with Dr.Gudena.  Patient is seeing Dr. Dalbert Batman tomorrow.  Discussed navigation resources.  Patient informed me that her son would like her to get a second opinion at [facility].  Encouraged her to call and let us know what she decides and to call with any questions or concerns.

## 2016-01-16 NOTE — Assessment & Plan Note (Signed)
Right breast lesion 2.7 x 2.4 x 2.2 cm; initial mammogram and ultrasound revealed 2.4 cm irregular hypoechoic mass at 8:00 position Right breast needle biopsy 8:00 12/28/2015: DCIS with calcifications, high-grade, ER 0%, PR 0%, Tis N0 stage 0  Pathology review: I discussed with the patient the difference between DCIS and invasive breast cancer. It is considered a precancerous lesion. DCIS is classified as a 0. It is generally detected through mammograms as calcifications. We discussed the significance of grades and its impact on prognosis. We also discussed the importance of ER and PR receptors and their implications to adjuvant treatment options. Prognosis of DCIS dependence on grade, comedo necrosis. It is anticipated that if not treated, 20-30% of DCIS can develop into invasive breast cancer.  Recommendation: 1. Breast conserving surgery 2. Followed by adjuvant radiation therapy No role of antiestrogen therapy since she is ER/PR negative  Return to clinic after surgery to discuss the final pathology report. Patient told me that she is getting a second opinion at [facility].

## 2016-01-16 NOTE — Progress Notes (Signed)
[employer] CONSULT NOTE  Patient Care Team: Marletta Lor, MD as PCP - General (Internal Medicine)  CHIEF COMPLAINTS/PURPOSE OF CONSULTATION:  Newly diagnosed breast cancer  HISTORY OF PRESENTING ILLNESS:  Cindy Schwartz 82 y.o. female is here because of recent diagnosis of right breast DCIS. She had a screening mammogram detected abnormality that was further evaluated by diagnostic mammograms and subsequently an ultrasound. Biopsy was performed that revealed high-grade DCIS that was ER/PR negative. She had a breast MRI performed that revealed a 2.7 cm lesion. She has seen Dr. Dalbert Batman with general surgery and will be seeing him soon to discuss surgical options.   I reviewed her records extensively and collaborated the history with the patient.  SUMMARY OF ONCOLOGIC HISTORY:   Breast cancer of lower-outer quadrant of right female breast ([place])   12/28/2015 Initial Diagnosis Right breast needle biopsy 8:00: DCIS with calcifications, high-grade, ER 0%, PR 0%, Tis N0 stage 0   01/14/2016 Breast MRI Right breast lesion 2.7 x 2.4 x 2.2 cm; initial mammogram and ultrasound revealed 2.4 cm irregular hypoechoic mass at 8:00 position   MEDICAL HISTORY:  Past Medical History  Diagnosis Date  . Osteoporosis   . Aneurysm of ascending aorta ([place])     a. 4.2-4.3cm in 2014.  Marland Kitchen History of ovarian cancer   . Raynaud's disease   . GERD (gastroesophageal reflux disease)   . Insomnia   . Normal coronary arteries Cath - 09/2014  . Elevated BP Transient 09/2014    SURGICAL HISTORY: Past Surgical History  Procedure Laterality Date  . Ovarian ca s/p taj/bso  1979  . Abdominal hysterectomy    . Appendectomy    . Bilateral salpingoophorectomy    . Left heart catheterization with coronary angiogram N/A 10/17/2014    Procedure: LEFT HEART CATHETERIZATION WITH CORONARY ANGIOGRAM;  Surgeon: [name], MD;  Location: [facility] CATH LAB;  Service: Cardiovascular;  Laterality: N/A;    SOCIAL  HISTORY: Social History   Social History  . Marital Status: Married    Spouse Name: N/A  . Number of Children: N/A  . Years of Education: N/A   Occupational History  . retired    Social History Main Topics  . Smoking status: Never Smoker   . Smokeless tobacco: Never Used  . Alcohol Use: No  . Drug Use: No  . Sexual Activity: Not on file   Other Topics Concern  . Not on file   Social History Narrative   She and her husband are from the [place] area of [place].`    FAMILY HISTORY: Family History  Problem Relation Age of Onset  . Healthy Mother   . Healthy Brother     ALLERGIES:  has No Known Allergies.  MEDICATIONS:  Current Outpatient Prescriptions  Medication Sig Dispense Refill  . b complex vitamins tablet Take 1 tablet by mouth daily.    . cetirizine (ZYRTEC) 10 MG tablet Take 10 mg by mouth daily.    Marland Kitchen LORazepam (ATIVAN) 0.5 MG tablet Take 1 tablet (0.5 mg total) by mouth 2 (two) times daily as needed for anxiety. 60 tablet 1  . NEXIUM 40 MG capsule Take 1 capsule (40 mg total) by mouth daily. 90 capsule 3  . propranolol (INDERAL) 10 MG tablet Take 1 tablet (10 mg total) by mouth as needed (Palpitations). 30 tablet 6  . valACYclovir (VALTREX) 500 MG tablet Take 500 mg by mouth once a week.     No current facility-administered medications for this  visit.    REVIEW OF SYSTEMS:   Constitutional: Denies fevers, chills or abnormal night sweats Eyes: Denies blurriness of vision, double vision or watery eyes Ears, nose, mouth, throat, and face: Denies mucositis or sore throat Respiratory: Denies cough, dyspnea or wheezes Cardiovascular: Denies palpitation, chest discomfort or lower extremity swelling Gastrointestinal:  Denies nausea, heartburn or change in bowel habits Skin: Denies abnormal skin rashes Lymphatics: Denies new lymphadenopathy or easy bruising Neurological:Denies numbness, tingling or new weaknesses Behavioral/Psych: Mood is stable, no new changes   Breast:  Denies any palpable lumps or discharge All other systems were reviewed with the patient and are negative.  PHYSICAL EXAMINATION: ECOG PERFORMANCE STATUS: 0 - Asymptomatic  Filed Vitals:   01/16/16 1451  BP: 127/83  Pulse: 73  Temp: 98.2 F (36.8 C)  Resp: 18   Filed Weights   01/16/16 1451  Weight: 148 lb (67.132 kg)    GENERAL:alert, no distress and comfortable SKIN: skin color, texture, turgor are normal, no rashes or significant lesions EYES: normal, conjunctiva are pink and non-injected, sclera clear OROPHARYNX:no exudate, no erythema and lips, buccal mucosa, and tongue normal  NECK: supple, thyroid normal size, non-tender, without nodularity LYMPH:  no palpable lymphadenopathy in the cervical, axillary or inguinal LUNGS: clear to auscultation and percussion with normal breathing effort HEART: regular rate & rhythm and no murmurs and no lower extremity edema ABDOMEN:abdomen soft, non-tender and normal bowel sounds Musculoskeletal:no cyanosis of digits and no clubbing  PSYCH: alert & oriented x 3 with fluent speech NEURO: no focal motor/sensory deficits BREAST: No palpable nodules in breast. No palpable axillary or supraclavicular lymphadenopathy (exam performed in the presence of a chaperone)   LABORATORY DATA:  I have reviewed the data as listed Lab Results  Component Value Date   WBC 8.3 10/01/2015   HGB 12.9 10/01/2015   HCT 39.2 10/01/2015   MCV 84.8 10/01/2015   PLT 218 10/01/2015   Lab Results  Component Value Date   NA 133* 10/01/2015   K 4.4 10/01/2015   CL 98 10/01/2015   CO2 26 10/01/2015    RADIOGRAPHIC STUDIES: I have personally reviewed the radiological reports and agreed with the findings in the report.  ASSESSMENT AND PLAN:  Breast cancer of lower-outer quadrant of left female breast ([facility]) Right breast lesion 2.7 x 2.4 x 2.2 cm; initial mammogram and ultrasound revealed 2.4 cm irregular hypoechoic mass at 8:00 position Right  breast needle biopsy 8:00 12/28/2015: DCIS with calcifications, high-grade, ER 0%, PR 0%, Tis N0 stage 0  Pathology review: I discussed with the patient the difference between DCIS and invasive breast cancer. It is considered a precancerous lesion. DCIS is classified as a 0. It is generally detected through mammograms as calcifications. We discussed the significance of grades and its impact on prognosis. We also discussed the importance of ER and PR receptors and their implications to adjuvant treatment options. Prognosis of DCIS dependence on grade, comedo necrosis. It is anticipated that if not treated, 20-30% of DCIS can develop into invasive breast cancer.  Recommendation: 1. Breast conserving surgery 2. Followed by adjuvant radiation therapy No role of antiestrogen therapy since she is ER/PR negative  Return to clinic after surgery to discuss the final pathology report. Patient told me that she is getting a second opinion at [facility].  All questions were answered. The patient knows to call the clinic with any problems, questions or concerns.    Rulon Eisenmenger, MD 01/16/2016

## 2016-01-17 ENCOUNTER — Encounter: Payer: Self-pay | Admitting: Hematology and Oncology

## 2016-01-17 ENCOUNTER — Other Ambulatory Visit: Payer: Self-pay | Admitting: General Surgery

## 2016-01-17 DIAGNOSIS — C50511 Malignant neoplasm of lower-outer quadrant of right female breast: Secondary | ICD-10-CM

## 2016-01-17 NOTE — Progress Notes (Signed)
New pt intake form rcvd.    Reviewed by Dr. Lindi Adie.  Chart updated.  Sent to scan.

## 2016-01-17 NOTE — Addendum Note (Signed)
Addended by: Prentiss Bells on: 01/17/2016 01:14 PM   Modules accepted: Medications

## 2016-01-21 ENCOUNTER — Ambulatory Visit: Payer: Medicare Other | Admitting: Hematology and Oncology

## 2016-01-22 ENCOUNTER — Encounter: Payer: Self-pay | Admitting: Adult Health

## 2016-01-22 ENCOUNTER — Ambulatory Visit (INDEPENDENT_AMBULATORY_CARE_PROVIDER_SITE_OTHER): Payer: Medicare Other | Admitting: Adult Health

## 2016-01-22 VITALS — BP 140/80 | Temp 98.1°F | Wt 147.0 lb

## 2016-01-22 DIAGNOSIS — R1012 Left upper quadrant pain: Secondary | ICD-10-CM | POA: Diagnosis not present

## 2016-01-22 LAB — POC URINALSYSI DIPSTICK (AUTOMATED)
Bilirubin, UA: NEGATIVE
GLUCOSE UA: NEGATIVE
Ketones, UA: NEGATIVE
Leukocytes, UA: NEGATIVE
NITRITE UA: NEGATIVE
Protein, UA: NEGATIVE
SPEC GRAV UA: 1.015
UROBILINOGEN UA: 0.2
pH, UA: 7

## 2016-01-22 NOTE — Progress Notes (Signed)
Subjective:    Patient ID: Cindy Schwartz, female    DOB: Jan 21, 1934, 82 y.o.   MRN: [id number]  HPI  82 year old female who presents to the office for left upper quadrant pain, under the ribs x " a couple of months" . The pain is intermittent. The pain is described "pressure like gas". Movement does not make the pain worse. Laying down helps with the pain. She reports that over the course of the week the pain increased, yesterday the pain was the worse - 8/10, today the pain is 5/10.   She denies any constipation but has never been regular.   She currently has breast cancer and is going to [facility] later on in the week for a second option. She is thinking Lumpectomy and radiation.   Denies any urinary symptoms, fever, nausea or vomiting.     Review of Systems  Constitutional: Negative.   Cardiovascular: Negative.   Gastrointestinal: Positive for abdominal pain and constipation. Negative for nausea, vomiting, diarrhea and rectal pain.  Genitourinary: Negative.   Skin: Negative.   Neurological: Negative.    Past Medical History  Diagnosis Date  . Osteoporosis   . Aneurysm of ascending aorta ([place])     a. 4.2-4.3cm in 2014.  Marland Kitchen Raynaud's disease   . GERD (gastroesophageal reflux disease)   . Insomnia   . Normal coronary arteries Cath - 09/2014  . Elevated BP Transient 09/2014  . History of ovarian cancer     November 1979  . H/O colonoscopy   . H/O bone density study     2015    Social History   Social History  . Marital Status: Married    Spouse Name: Publishing copy  . Number of Children: 2  . Years of Education: N/A   Occupational History  . retired    Social History Main Topics  . Smoking status: Never Smoker   . Smokeless tobacco: Never Used  . Alcohol Use: No  . Drug Use: No  . Sexual Activity: Not on file   Other Topics Concern  . Not on file   Social History Narrative   She and her husband are from the [place] of [place].`   Children: [name] age 37,  lives in [place], [place], age 20, lives in [place], [place]    Past Surgical History  Procedure Laterality Date  . Ovarian ca s/p taj/bso  1979  . Abdominal hysterectomy    . Appendectomy    . Bilateral salpingoophorectomy    . Left heart catheterization with coronary angiogram N/A 10/17/2014    Procedure: LEFT HEART CATHETERIZATION WITH CORONARY ANGIOGRAM;  Surgeon: [name], MD;  Location: [facility] CATH LAB;  Service: Cardiovascular;  Laterality: N/A;    Family History  Problem Relation Age of Onset  . Healthy Mother   . Healthy Brother     No Known Allergies  Current Outpatient Prescriptions on File Prior to Visit  Medication Sig Dispense Refill  . b complex vitamins tablet Take 1 tablet by mouth daily.    . cetirizine (ZYRTEC) 10 MG tablet Take 10 mg by mouth daily.    . Cholecalciferol (VITAMIN D PO) Take by mouth.    Marland Kitchen LORazepam (ATIVAN) 0.5 MG tablet Take 1 tablet (0.5 mg total) by mouth 2 (two) times daily as needed for anxiety. 60 tablet 1  . NEXIUM 40 MG capsule Take 1 capsule (40 mg total) by mouth daily. 90 capsule 3  . propranolol (  INDERAL) 10 MG tablet Take 1 tablet (10 mg total) by mouth as needed (Palpitations). 30 tablet 6  . UNABLE TO FIND as needed. Walrex    . valACYclovir (VALTREX) 500 MG tablet Take 500 mg by mouth once a week.     No current facility-administered medications on file prior to visit.    BP 140/80 mmHg  Temp(Src) 98.1 F (36.7 C) (Oral)  Wt 147 lb (66.679 kg)       Objective:   Physical Exam  Constitutional: She is oriented to person, place, and time. She appears well-developed and well-nourished. No distress.  Cardiovascular: Normal rate, regular rhythm, normal heart sounds and intact distal pulses.  Exam reveals no gallop and no friction rub.   No murmur heard. Pulmonary/Chest: Effort normal and breath sounds normal. No respiratory distress. She has no wheezes. She has no rales. She exhibits no tenderness.    Abdominal: Soft. Bowel sounds are normal. She exhibits no distension and no mass. There is no hepatosplenomegaly, splenomegaly or hepatomegaly. There is tenderness in the left upper quadrant. There is no rigidity, no rebound, no guarding and no CVA tenderness. No hernia.    Neurological: She is alert and oriented to person, place, and time.  Skin: Skin is warm and dry. No rash noted. She is not diaphoretic. No erythema. No pallor.  Psychiatric: She has a normal mood and affect. Her behavior is normal. Judgment and thought content normal.  Nursing note and vitals reviewed.     Assessment & Plan:  1. Abdominal pain, left upper quadrant Peptic Ulcer vs IBS vs Pancreatitis vs Biliary Colic vs constipation. Cannot r/o metastasis  - Basic metabolic panel - Hepatic function panel - Lipase - POCT Urinalysis Dipstick (Automated) - Sedimentation Rate - C-reactive Protein - Consider CT of abdomen .  Dorothyann Peng, NP

## 2016-01-22 NOTE — Patient Instructions (Signed)
It was great meeting you today.   As discussed, lets start by getting some labs. I will follow up with you regarding those and then we can go from there.   Add Metamucil to your diet.

## 2016-01-23 ENCOUNTER — Telehealth: Payer: Self-pay | Admitting: Adult Health

## 2016-01-23 ENCOUNTER — Telehealth: Payer: Self-pay | Admitting: Hematology and Oncology

## 2016-01-23 DIAGNOSIS — R1032 Left lower quadrant pain: Secondary | ICD-10-CM

## 2016-01-23 LAB — BASIC METABOLIC PANEL
BUN: 14 mg/dL (ref 6–23)
CALCIUM: 9.7 mg/dL (ref 8.4–10.5)
CHLORIDE: 98 meq/L (ref 96–112)
CO2: 28 meq/L (ref 19–32)
CREATININE: 0.66 mg/dL (ref 0.40–1.20)
GFR: 91.56 mL/min (ref >60.00)
Glucose, Bld: 92 mg/dL (ref 70–99)
Potassium: 4.3 mEq/L (ref 3.5–5.1)
Sodium: 131 mEq/L — ABNORMAL LOW (ref 135–145)

## 2016-01-23 LAB — SEDIMENTATION RATE: SED RATE: 13 mm/h (ref 0–22)

## 2016-01-23 LAB — LIPASE: LIPASE: 34 U/L (ref 11.0–59.0)

## 2016-01-23 LAB — HEPATIC FUNCTION PANEL
ALT: 19 U/L (ref 0–35)
AST: 23 U/L (ref 0–37)
Albumin: 4.1 g/dL (ref 3.5–5.2)
Alkaline Phosphatase: 63 U/L (ref 39–117)
BILIRUBIN DIRECT: 0.1 mg/dL (ref 0.0–0.3)
BILIRUBIN TOTAL: 0.4 mg/dL (ref 0.2–1.2)
TOTAL PROTEIN: 7 g/dL (ref 6.0–8.3)

## 2016-01-23 LAB — C-REACTIVE PROTEIN: CRP: 0.2 mg/dL — AB (ref 0.5–20.0)

## 2016-01-23 NOTE — Telephone Encounter (Signed)
Faxed records requested by [facility] Breast Dept. [id number] release id)

## 2016-01-23 NOTE — Telephone Encounter (Signed)
Spoke to patient and informed her of her lab results. She reports that she continues to have " really bad" stomach pains.   I feel as though getting a CT of the abdomen would be warranted.

## 2016-01-24 ENCOUNTER — Other Ambulatory Visit: Payer: Medicare Other

## 2016-01-25 ENCOUNTER — Telehealth: Payer: Self-pay | Admitting: Adult Health

## 2016-01-25 ENCOUNTER — Ambulatory Visit (INDEPENDENT_AMBULATORY_CARE_PROVIDER_SITE_OTHER)
Admission: RE | Admit: 2016-01-25 | Discharge: 2016-01-25 | Disposition: A | Discharge status: Home / Self Care | Payer: Medicare Other | Source: Ambulatory Visit | Attending: Adult Health | Admitting: Adult Health

## 2016-01-25 DIAGNOSIS — R1032 Left lower quadrant pain: Secondary | ICD-10-CM | POA: Diagnosis not present

## 2016-01-25 MED ORDER — IOPAMIDOL (ISOVUE-300) INJECTION 61%
100.0000 mL | Freq: Once | INTRAVENOUS | Status: AC | PRN
  Administered 2016-01-25: 100 mL via INTRAVENOUS

## 2016-01-25 NOTE — Telephone Encounter (Signed)
Attempted to call. No answer and unable to leave message.

## 2016-01-25 NOTE — Telephone Encounter (Signed)
Updated patient on CT results. She continues to have left lower quadrant pain. Advised to follow up with Dr. Raliegh Ip.

## 2016-01-29 ENCOUNTER — Telehealth: Payer: Self-pay | Admitting: Cardiovascular Disease

## 2016-01-29 MED ORDER — METOPROLOL SUCCINATE ER 25 MG PO TB24: 25.0000 mg | ORAL_TABLET | Freq: Every day | ORAL | Status: DC

## 2016-01-29 NOTE — Telephone Encounter (Signed)
Request for surgical clearance:  What type of surgery is being performed? Right sided partial Macestomy   1. When is this surgery scheduled? 02/04/16  2. Are there any medications that need to be held prior to surgery and how long?Blood thinners , 5 days before and 5 days after    3. Name of physician performing surgery? Dr. Freddi Che   4. What is your office phone and fax number? [phone]

## 2016-01-29 NOTE — Telephone Encounter (Signed)
Called patient with new medication order. Patient verbalized understanding, and requested a week supply to local pharmacy, due to mail order taking at least five days.

## 2016-01-29 NOTE — Telephone Encounter (Signed)
At patient's last visit we started Cindy Schwartz on Inderal 10 mg PRN for palpitations. Wanted clarification are we changing Cindy Schwartz to toprol or is this an addition to.

## 2016-01-29 NOTE — Addendum Note (Signed)
Addended by: Aris Georgia, Mammie Meras L on: 01/29/2016 01:55 PM   Modules accepted: Orders

## 2016-01-29 NOTE — Telephone Encounter (Signed)
Take toprol 25 daily inderal was only prn

## 2016-01-29 NOTE — Telephone Encounter (Signed)
Ok to have surgery nurse should be calling in toprol 25 mg to start for her palpitations

## 2016-04-04 ENCOUNTER — Ambulatory Visit (INDEPENDENT_AMBULATORY_CARE_PROVIDER_SITE_OTHER): Payer: Medicare Other | Admitting: Adult Health

## 2016-04-04 ENCOUNTER — Encounter: Payer: Self-pay | Admitting: Adult Health

## 2016-04-04 VITALS — BP 118/64 | Temp 98.1°F | Wt 145.2 lb

## 2016-04-04 DIAGNOSIS — N61 Mastitis without abscess: Secondary | ICD-10-CM | POA: Diagnosis not present

## 2016-04-04 MED ORDER — CEPHALEXIN 500 MG PO CAPS: 500.0000 mg | ORAL_CAPSULE | Freq: Four times a day (QID) | ORAL | Status: DC

## 2016-04-04 NOTE — Progress Notes (Signed)
Subjective:    Patient ID: Cindy Schwartz, female    DOB: [birth year], 82 y.o.   MRN: [id number]  HPI  82 year old female, patient of Dr. Raliegh Ip who presents for an acute issue. She is 1 month s/p right breast surgery for a "pre cancerous" mass. Her recovery has been complicated by what she reports as a blood clot after the surgery and further by having to go back in the future for additional surgery due to unclear margins.   Today she reports that yesterday she started to notice a red tender rash under the right breast.   She has an appointment with [facility] on 04/08/2016   She denies any other symptoms   Review of Systems  Skin: Positive for color change and rash.  All other systems reviewed and are negative.  Past Medical History  Diagnosis Date  . Osteoporosis   . Aneurysm of ascending aorta ([place])     a. 4.2-4.3cm in 2014.  Marland Kitchen Raynaud's disease   . GERD (gastroesophageal reflux disease)   . Insomnia   . Normal coronary arteries Cath - 09/2014  . Elevated BP Transient 09/2014  . History of ovarian cancer     November 1979  . H/O colonoscopy   . H/O bone density study     2015    Social History   Social History  . Marital Status: Married    Spouse Name: Publishing copy  . Number of Children: 2  . Years of Education: N/A   Occupational History  . retired    Social History Main Topics  . Smoking status: Never Smoker   . Smokeless tobacco: Never Used  . Alcohol Use: No  . Drug Use: No  . Sexual Activity: Not on file   Other Topics Concern  . Not on file   Social History Narrative   She and her husband are from the [place] of [place].`   Children: [name] age 34, lives in [place], [place], age 62, lives in [place], [place]    Past Surgical History  Procedure Laterality Date  . Ovarian ca s/p taj/bso  1979  . Abdominal hysterectomy    . Appendectomy    . Bilateral salpingoophorectomy    . Left heart catheterization with coronary angiogram N/A  10/17/2014    Procedure: LEFT HEART CATHETERIZATION WITH CORONARY ANGIOGRAM;  Surgeon: [name], MD;  Location: [facility] CATH LAB;  Service: Cardiovascular;  Laterality: N/A;    Family History  Problem Relation Age of Onset  . Healthy Mother   . Healthy Brother     No Known Allergies  Current Outpatient Prescriptions on File Prior to Visit  Medication Sig Dispense Refill  . b complex vitamins tablet Take 1 tablet by mouth daily.    . cetirizine (ZYRTEC) 10 MG tablet Take 10 mg by mouth daily.    . Cholecalciferol (VITAMIN D PO) Take by mouth.    Marland Kitchen LORazepam (ATIVAN) 0.5 MG tablet Take 1 tablet (0.5 mg total) by mouth 2 (two) times daily as needed for anxiety. 60 tablet 1  . NEXIUM 40 MG capsule Take 1 capsule (40 mg total) by mouth daily. 90 capsule 3  . propranolol (INDERAL) 10 MG tablet Take 1 tablet (10 mg total) by mouth as needed (Palpitations). 30 tablet 6  . UNABLE TO FIND as needed. Walrex    . valACYclovir (VALTREX) 500 MG tablet Take 500 mg by mouth once a week.     No  current facility-administered medications on file prior to visit.    BP 118/64 mmHg  Temp(Src) 98.1 F (36.7 C) (Oral)  Wt 145 lb 3.2 oz (65.862 kg)       Objective:   Physical Exam  Constitutional: She is oriented to person, place, and time. She appears well-developed and well-nourished. No distress.  Musculoskeletal: She exhibits no edema.  Neurological: She is alert and oriented to person, place, and time.  Skin: Skin is warm and dry. Rash noted. She is not diaphoretic. There is erythema.  Red, warm and tender rash under right breast that spreads from 6 o clock to 8 o clock   Psychiatric: She has a normal mood and affect. Her behavior is normal. Judgment and thought content normal.  Nursing note and vitals reviewed.     Assessment & Plan:  1. Cellulitis of right breast - Appears as cellulitis. Will treat with Keflex until she is seen by her Oncologist/Surgeon on Tuesday - cephALEXin (KEFLEX)  500 MG capsule; Take 1 capsule (500 mg total) by mouth 4 (four) times daily.  Dispense: 28 capsule; Refill: 0 - Follow up as needed  Dorothyann Peng, NP

## 2016-04-04 NOTE — Patient Instructions (Addendum)
It was great seeing you again!  Your exam is consistent with cellulitis. I have called in a prescription for a antibiotic called Keflex. Take this 4 times a day for 7 days.   Follow up at [facility] on Tuesday.   Please let me know if you need anythin g  Cellulitis Cellulitis is an infection of the skin and the tissue beneath it. The infected area is usually red and tender. Cellulitis occurs most often in the arms and lower legs.  CAUSES  Cellulitis is caused by bacteria that enter the skin through cracks or cuts in the skin. The most common types of bacteria that cause cellulitis are staphylococci and streptococci. SIGNS AND SYMPTOMS   Redness and warmth.  Swelling.  Tenderness or pain.  Fever. DIAGNOSIS  Your health care provider can usually determine what is wrong based on a physical exam. Blood tests may also be done. TREATMENT  Treatment usually involves taking an antibiotic medicine. HOME CARE INSTRUCTIONS   Take your antibiotic medicine as directed by your health care provider. Finish the antibiotic even if you start to feel better.  Keep the infected arm or leg elevated to reduce swelling.  Apply a warm cloth to the affected area up to 4 times per day to relieve pain.  Take medicines only as directed by your health care provider.  Keep all follow-up visits as directed by your health care provider. SEEK MEDICAL CARE IF:   You notice red streaks coming from the infected area.  Your red area gets larger or turns dark in color.  Your bone or joint underneath the infected area becomes painful after the skin has healed.  Your infection returns in the same area or another area.  You notice a swollen bump in the infected area.  You develop new symptoms.  You have a fever. SEEK IMMEDIATE MEDICAL CARE IF:   You feel very sleepy.  You develop vomiting or diarrhea.  You have a general ill feeling (malaise) with muscle aches and pains.   This information is not intended  to replace advice given to you by your health care provider. Make sure you discuss any questions you have with your health care provider.   Document Released: 06/18/2005 Document Revised: 05/30/2015 Document Reviewed: 11/24/2011 Elsevier Interactive Patient Education [employer].

## 2016-04-16 ENCOUNTER — Ambulatory Visit (INDEPENDENT_AMBULATORY_CARE_PROVIDER_SITE_OTHER): Payer: Medicare Other | Admitting: Podiatry

## 2016-04-16 ENCOUNTER — Encounter: Payer: Self-pay | Admitting: Podiatry

## 2016-04-16 VITALS — BP 97/55 | HR 73 | Temp 98.0°F | Resp 18

## 2016-04-16 DIAGNOSIS — Q828 Other specified congenital malformations of skin: Secondary | ICD-10-CM

## 2016-04-16 NOTE — Progress Notes (Signed)
   Subjective:    Patient ID: Cindy Schwartz, female    DOB: [birth year], 82 y.o.   MRN: [id number]  HPI   This patient presents today complaining of painful plantar skin lesions on the right and left feet. The lesions have been present for approximately 7 years and patient has had periodic debridement of these lesions. The last schedule visit for this type of problem was on 09/04/2014.patient denies any other professional treatment or self treatment for these lesions.    Review of Systems  All other systems reviewed and are negative.      Objective:   Physical Exam  Orientated 3  Vascular: DP and PT pulses 2/4 bilaterally Capillary reflex within normal limits bilaterally  Neurological: Sensation to 10 g monofilament wire intact 4/5 right and 5/5 left Vibratory sensation reactive bilaterally Ankle reflex equal and reactive bilaterally  Dermatological: No open skin lesions bilaterally Nucleated plantar lesions subsecond MPJ 2 and sub-heel right 1 Nucleated plantar lesion subsecond MPJ left  Musculoskeletal: HAV deformities bilaterally There is no restriction ankle, subtalar, midtarsal joints bilaterally      Assessment & Plan:   Assessment: Satisfactory neurovascular status Porokeratosis toe series 4  Plan: I inform patient that these lesions were chronic hyperkeratotic tissues and would need periodic debridement at patient's request. Patient consents to debridement today. Porokeratosis 4 debrided without any bleeding and packed with salinocaine.struct and patient to keep bandages dry and intact and Levon up to 3-5 days  Reappoint at patient's request

## 2016-04-16 NOTE — Patient Instructions (Signed)
Today your examination demonstrated adequate circulation and feeling in your feet The skin lesions on your feet are corn-like growths and will recur. Today I trim these and applied acid to these areas in the ball area feet If possibly these bandages on 3-5 days and keep dry Return as needed

## 2016-06-11 ENCOUNTER — Telehealth: Payer: Self-pay | Admitting: Cardiovascular Disease

## 2016-06-11 NOTE — Telephone Encounter (Signed)
Patient is feeling dizzy, lightheaded, and states she just does not feel right. Patient stated she thinks something is going on with her heart,especially when she binds over. Patient complained of a pain in the back of her head. Informed patient that it sounds like she needs to go to the ED. Patient is refusing to go to ED, and she is asking if there is any way she can see a PA tomorrow. Brittainy Simmons PA, had an early morning opening, patient agreed to come in for this appointment. Encouraged patient if her symptoms get worse to go to the ED. Patient verbalized understanding.

## 2016-06-11 NOTE — Telephone Encounter (Signed)
Mrs. Boyer is calling because she feels something is not right with her heart and when she bends over it feels like she is about to pass out . She states that she is leaving to go out of town on Friday . Please call   Thanks

## 2016-06-12 ENCOUNTER — Ambulatory Visit (INDEPENDENT_AMBULATORY_CARE_PROVIDER_SITE_OTHER): Payer: Medicare Other | Admitting: Cardiology

## 2016-06-12 ENCOUNTER — Encounter: Payer: Self-pay | Admitting: Cardiology

## 2016-06-12 VITALS — BP 136/78 | HR 66 | Ht 68.0 in | Wt 147.8 lb

## 2016-06-12 DIAGNOSIS — R42 Dizziness and giddiness: Secondary | ICD-10-CM | POA: Diagnosis not present

## 2016-06-12 LAB — BASIC METABOLIC PANEL
Anion gap: 6 (ref 5–15)
BUN: 15 mg/dL (ref 6–20)
CHLORIDE: 100 mmol/L — AB (ref 101–111)
CO2: 27 mmol/L (ref 22–32)
CREATININE: 0.64 mg/dL (ref 0.44–1.00)
Calcium: 9.6 mg/dL (ref 8.9–10.3)
GFR calc Af Amer: >60 mL/min (ref >60)
GFR calc non Af Amer: >60 mL/min (ref >60)
Glucose, Bld: 74 mg/dL (ref 65–99)
Potassium: 4.2 mmol/L (ref 3.5–5.1)
SODIUM: 133 mmol/L — AB (ref 135–145)

## 2016-06-12 LAB — CBC
HCT: 38.3 % (ref 36.0–46.0)
Hemoglobin: 12.4 g/dL (ref 12.0–15.0)
MCH: 27.3 pg (ref 26.0–34.0)
MCHC: 32.4 g/dL (ref 30.0–36.0)
MCV: 84.4 fL (ref 78.0–100.0)
PLATELETS: 202 10*3/uL (ref 150–400)
RBC: 4.54 MIL/uL (ref 3.87–5.11)
RDW: 15.1 % (ref 11.5–15.5)
WBC: 5.9 10*3/uL (ref 4.0–10.5)

## 2016-06-12 NOTE — Patient Instructions (Addendum)
Medication Instructions:  Your physician recommends that you continue on your current medications as directed. Please refer to the Current Medication list given to you today.   Labwork: TODAY;  STAT CBC & BMET  Testing/Procedures: None ordered  Follow-Up: Your physician recommends that you schedule a follow-up appointment in: SEE DR. NISHAN AS PLANNED   Any Other Special Instructions Will Be Listed Below (If Applicable).     If you need a refill on your cardiac medications before your next appointment, please call your pharmacy.

## 2016-06-12 NOTE — Progress Notes (Signed)
06/12/2016 Cindy Schwartz   September 11, 1934  [id number]  Primary Physician Nyoka Cowden, MD Primary Cardiologist: Dr. Johnsie Cancel    Reason for Visit/CC: Dizziness, lightheadedness  HPI:  Pt  is a 82 year old female, followed by Dr. Johnsie Cancel, who presents to clinic today as an add on, with a chief complaint of dizziness and lightheadedness.  She has a history of stable fusiform dilatation of aortic root 4.2 cm. She  was admitted to the hospital last year in Jan 2016 with chest pain associated with blood pressures of 171/109 which was unusual for her. Cardiac catheterization showed normal coronary arteries, normal LV function and mild to moderate dilatation of the ascending aorta without evidence of dissection. She also has a history of a thoracic aortic aneurysm last measured in 2014 at 4.2-4.3 cm followed by Dr. [name]. Event monitor was neg last year. NSR with PACs/PVCs and 8 beat run of SVT. She has had a lot of stress recently. Her husband was diagnosed with lung cancer and she was just diagnosed with breast cancer. She is s/p lumpectomy. He is undergoing chemo.  She reports that she has had several brief episodes of mild dizziness and lightheadedness. She first developed symptoms while standing at the sink washing dishes. It occurred suddenly and lasted only a few seconds. She had a slight HA at the time. This has occurred several times over the last several days, but not always accompanied by a HA. She denies any other associated symptoms, including no palpations, CP, dyspnea or syncope. She has had some occasional chills recently and feels congested in her chest however she denies any symptoms of cough, sore throat or rhinorrhea. She does note a history of seasonal allergies. She denies recent changes to any of her prescription medications. She only take propanolol PRN for palpitations, but has not required use recently. She reports good PO intake. She does admit that she continues to be under a  great deal of stress given her husband's situation. Both she and her husband are planning on traveling to [place], by plane, tomorrow to attend their only granddaughter's wedding this weekend.  She is currently asymptomatic in clinic today. She is in no acute distress. Orthostatic vital signs were checked and are normal (see below). EKG shows sinus rhythm with first-degree AV block. 65 bpm.  Orthostatic VS for the past 24 hrs:  BP- Lying Pulse- Lying BP- Sitting Pulse- Sitting BP- Standing at 0 minutes Pulse- Standing at 0 minutes  06/12/16 0944 126/78 65 118/73 64 112/72 74      Current Meds  Medication Sig  . b complex vitamins tablet Take 1 tablet by mouth daily.  . cetirizine (ZYRTEC) 10 MG tablet Take 10 mg by mouth daily.  Marland Kitchen NEXIUM 40 MG capsule Take 1 capsule (40 mg total) by mouth daily.  . propranolol (INDERAL) 10 MG tablet Take 1 tablet (10 mg total) by mouth as needed (Palpitations).  . valACYclovir (VALTREX) 500 MG tablet Take 500 mg by mouth once a week.   Allergies  Allergen Reactions  . Chlorhexidine Gluconate Rash   Past Medical History:  Diagnosis Date  . Aneurysm of ascending aorta ([place])    a. 4.2-4.3cm in 2014.  Marland Kitchen Elevated BP Transient 09/2014  . GERD (gastroesophageal reflux disease)   . H/O bone density study    2015  . H/O colonoscopy   . History of ovarian cancer    November 1979  . Insomnia   . Normal coronary arteries Cath - 09/2014  .  Osteoporosis   . Raynaud's disease    Family History  Problem Relation Age of Onset  . Healthy Mother   . Healthy Brother    Past Surgical History:  Procedure Laterality Date  . ABDOMINAL HYSTERECTOMY    . APPENDECTOMY    . BILATERAL SALPINGOOPHORECTOMY    . LEFT HEART CATHETERIZATION WITH CORONARY ANGIOGRAM N/A 10/17/2014   Procedure: LEFT HEART CATHETERIZATION WITH CORONARY ANGIOGRAM;  Surgeon: [name], MD;  Location: [facility] CATH LAB;  Service: Cardiovascular;  Laterality: N/A;  . ovarian ca s/p taj/bso   1979   Social History   Social History  . Marital status: Married    Spouse name: Publishing copy  . Number of children: 2  . Years of education: N/A   Occupational History  . retired Retired   Social History Main Topics  . Smoking status: Never Smoker  . Smokeless tobacco: Never Used  . Alcohol use No  . Drug use: No  . Sexual activity: Not on file   Other Topics Concern  . Not on file   Social History Narrative   She and her husband are from the [place] of [place].`   Children: [name] Schlitt age 82, lives in [place], Leroy, age 76, lives in [place], [place]     Review of Systems: General: negative for chills, fever, night sweats or weight changes.  Cardiovascular: negative for chest pain, dyspnea on exertion, edema, orthopnea, palpitations, paroxysmal nocturnal dyspnea or shortness of breath Dermatological: negative for rash Respiratory: negative for cough or wheezing Urologic: negative for hematuria Abdominal: negative for nausea, vomiting, diarrhea, bright red blood per rectum, melena, or hematemesis Neurologic: negative for visual changes, syncope, or dizziness All other systems reviewed and are otherwise negative except as noted above.   Physical Exam:  Blood pressure 136/78, pulse 66, height 5\' 8"  (1.727 m), weight 147 lb 12.8 oz (67 kg).  General appearance: alert, cooperative and no distress Neck: no carotid bruit and no JVD Lungs: clear to auscultation bilaterally Heart: regular rate and rhythm and soft 1/6 Murmur at RLSB Extremities: no LEE Pulses: 2+ and symmetric Skin: warm and dry Neurologic: Grossly normal  EKG SR with 1st degree AVB. RBBB.   ASSESSMENT AND PLAN:   1. Dizziness/ Lightheadedness: Recent spells lasting only seconds. No associated chest pain, palpitations, dyspnea or syncope.  She had a negative left heart catheterization in 2016. Evaluation by Holter monitors in the past have revealed PACs/PVCs. EKG today demonstrates  sinus rhythm with first-degree AV block with a heart rate of 65 bpm. Orthostatic vital signs are negative. Physical exam is unremarkable.  Currently asymptomatic. She notes regular PO intake. Will check basic labs including CBC and BMP to ensure no anemia, infection, electrolyte abnormalities or dehydration. No indication for further cardiac w/u at this time. Patient advised to stay well hydrated.   2. H/o PVCs/PAC: patent denies any recent palpiations. She takes propanolol PRN. Has not required any recent use.   3. Breast CA: s/p lumpectomy. Not undergoing any additional therapy.   4. Stress: patient under great deal of emotional stress, as her husband is undergoing Tx for Lung CA. Patient advised to maintain good hydration and rest, if possible.   5. Thoracic aortic aneurysm: followed by Dr. [name].     PLAN  She has her 6 month f/u with Dr. Johnsie Cancel in October. Patient encouraged to keep.   Lyda Jester PA-C 06/12/2016 9:46 AM

## 2016-06-25 ENCOUNTER — Ambulatory Visit: Payer: Medicare Other | Admitting: Internal Medicine

## 2016-07-04 NOTE — Progress Notes (Signed)
07/07/2016 Cindy Schwartz   05/28/1934  [id number]  Primary Physician Nyoka Cowden, MD Primary Cardiologist: Dr. 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 She  was admitted to the hospital last year in Jan 2016 with chest pain associated with blood pressures of 171/109 which was unusual for her. Cardiac catheterization showed normal coronary arteries, normal LV function and mild to moderate dilatation of the ascending aorta without evidence of dissection. She also has a history of a thoracic aortic aneurysm last measured in 2014 at 4.2-4.3 cm followed by Dr. [name]. Event monitor was neg last year. NSR with PACs/PVCs and 8 beat run of SVT. She has had a lot of stress recently. Her husband was diagnosed with lung cancer and she was just diagnosed with breast cancer. She is s/p lumpectomy. He is undergoing chemo.  She reports that she has had several brief episodes of mild dizziness and lightheadedness. She first developed symptoms while standing at the sink washing dishes. It occurred suddenly and lasted only a few seconds. She had a slight HA at the time. This has occurred several times over the last several days, but not always accompanied by a HA. She denies any other associated symptoms, including no palpations, CP, dyspnea or syncope. She has had some occasional chills recently and feels congested in her chest however she denies any symptoms of cough, sore throat or rhinorrhea. She does note a history of seasonal allergies. She denies recent changes to any of her prescription medications. She only take propanolol PRN for palpitations, but has not required use recently. She reports good PO intake. She does admit that she continues to be under a great deal of stress given her husband's situation.   She is currently asymptomatic in clinic  today. She is in no acute distress. Orthostatic vital signs were checked and are normal (see below). EKG shows sinus rhythm with first-degree AV block. 65 bpm.  Went to [place] last month for grand daughters wedding in [place] Was nice but Sheliah Plane was too weak to dance    Current Meds  Medication Sig  . cetirizine (ZYRTEC) 10 MG tablet Take 10 mg by mouth daily as needed for allergies.   Marland Kitchen esomeprazole (NEXIUM) 40 MG capsule Take 40 mg by mouth every other day.  . propranolol (INDERAL) 10 MG tablet Take 1 tablet (10 mg total) by mouth as needed (Palpitations).  . valACYclovir (VALTREX) 500 MG tablet Take 500 mg by mouth once a week.   Allergies  Allergen Reactions  . Chlorhexidine Gluconate Rash   Past Medical History:  Diagnosis Date  . Aneurysm of ascending aorta ([place])    a. 4.2-4.3cm in 2014.  Marland Kitchen Elevated BP Transient 09/2014  . GERD (gastroesophageal reflux disease)   . H/O bone density study    2015  . H/O colonoscopy   . History of ovarian cancer    November 1979  . Insomnia   . Normal coronary arteries Cath - 09/2014  . Osteoporosis   . Raynaud's disease    Family History  Problem Relation Age of Onset  . Healthy Mother   . Healthy Brother    Past Surgical History:  Procedure Laterality Date  . ABDOMINAL HYSTERECTOMY    . APPENDECTOMY    . BILATERAL SALPINGOOPHORECTOMY    . LEFT HEART CATHETERIZATION WITH CORONARY ANGIOGRAM N/A 10/17/2014  Procedure: LEFT HEART CATHETERIZATION WITH CORONARY ANGIOGRAM;  Surgeon: [name], MD;  Location: [facility] CATH LAB;  Service: Cardiovascular;  Laterality: N/A;  . ovarian ca s/p taj/bso  1979   Social History   Social History  . Marital status: Married    Spouse name: Publishing copy  . Number of children: 2  . Years of education: N/A   Occupational History  . retired Retired   Social History Main Topics  . Smoking status: Never Smoker  . Smokeless tobacco: Never Used  . Alcohol use No  . Drug use: No  . Sexual activity: Not  on file   Other Topics Concern  . Not on file   Social History Narrative   She and her husband are from the [place] of [place].`   Children: [name] Bun age 85, lives in [place], [place], age 63, lives in [place], [place]     Review of Systems: General: negative for chills, fever, night sweats or weight changes.  Cardiovascular: negative for chest pain, dyspnea on exertion, edema, orthopnea, palpitations, paroxysmal nocturnal dyspnea or shortness of breath Dermatological: negative for rash Respiratory: negative for cough or wheezing Urologic: negative for hematuria Abdominal: negative for nausea, vomiting, diarrhea, bright red blood per rectum, melena, or hematemesis Neurologic: negative for visual changes, syncope, or dizziness All other systems reviewed and are otherwise negative except as noted above.   Physical Exam:  Blood pressure 128/66, pulse 78, height 5\' 8"  (1.727 m), weight 66.6 kg (146 lb 12.8 oz).  General appearance: alert, cooperative and no distress Neck: no carotid bruit and no JVD Lungs: clear to auscultation bilaterally Heart: regular rate and rhythm and soft 1/6 Murmur at RLSB Extremities: no LEE Pulses: 2+ and symmetric Skin: warm and dry Neurologic: Grossly normal  EKG SR with 1st degree AVB. RBBB.   ASSESSMENT AND PLAN:   1. Dizziness/ Lightheadedness: Recent spells lasting only seconds. No associated chest pain, palpitations, dyspnea or syncope.  She had a negative left heart catheterization in 2016. Evaluation by Holter monitors in the past have revealed PACs/PVCs. EKG today demonstrates sinus rhythm with first-degree AV block with a heart rate of 65 bpm. Orthostatic vital signs are negative. Physical exam is unremarkable.  CBC and BMET are fine Observe  2. H/o PVCs/PAC: patent denies any recent palpiations. She takes propanolol PRN. Has not required any recent use.   3. Breast CA: s/p lumpectomy. Not undergoing any additional  therapy.   4. Stress: patient under great deal of emotional stress, as her husband is undergoing Tx for Lung CA. Patient advised to maintain good hydration and rest, if possible.   5. Thoracic aortic aneurysm: followed by Dr. [name].     PLAN  She has her 6 month f/u with me   Jenkins Rouge

## 2016-07-07 ENCOUNTER — Ambulatory Visit (INDEPENDENT_AMBULATORY_CARE_PROVIDER_SITE_OTHER): Payer: Medicare Other | Admitting: Cardiovascular Disease

## 2016-07-07 VITALS — BP 128/66 | HR 78 | Ht 68.0 in | Wt 146.8 lb

## 2016-07-07 DIAGNOSIS — R002 Palpitations: Secondary | ICD-10-CM | POA: Diagnosis not present

## 2016-07-07 NOTE — Patient Instructions (Signed)

## 2016-07-09 ENCOUNTER — Encounter: Payer: Self-pay | Admitting: Internal Medicine

## 2016-07-09 ENCOUNTER — Ambulatory Visit (INDEPENDENT_AMBULATORY_CARE_PROVIDER_SITE_OTHER): Payer: Medicare Other | Admitting: Internal Medicine

## 2016-07-09 ENCOUNTER — Telehealth: Payer: Self-pay | Admitting: Internal Medicine

## 2016-07-09 VITALS — BP 112/70 | HR 61 | Temp 98.1°F | Resp 18 | Ht 68.0 in | Wt 145.0 lb

## 2016-07-09 DIAGNOSIS — M810 Age-related osteoporosis without current pathological fracture: Secondary | ICD-10-CM | POA: Diagnosis not present

## 2016-07-09 DIAGNOSIS — J301 Allergic rhinitis due to pollen: Secondary | ICD-10-CM | POA: Diagnosis not present

## 2016-07-09 DIAGNOSIS — M5030 Other cervical disc degeneration, unspecified cervical region: Secondary | ICD-10-CM

## 2016-07-09 MED ORDER — VALACYCLOVIR HCL 500 MG PO TABS: 500.0000 mg | ORAL_TABLET | ORAL | 5 refills | Status: DC

## 2016-07-09 NOTE — Telephone Encounter (Signed)
Called pharmacy and spoke to [place], told her pt is only taking it as needed and only taking once a week. Juliann Pulse verbalized understanding.

## 2016-07-09 NOTE — Progress Notes (Signed)
Subjective:    Patient ID: Cindy Schwartz, female    DOB: 31-Jan-1934, 82 y.o.   MRN: [id number]  HPI  82 year old patient who is seen today for her six-month follow-up.  She has been seen by cardiology recently and is scheduled to see vascular surgery for follow-up of a thoracic aneurysm. She is also followed at [facility] following treatment of right breast cancer.  In general doing quite well, although under situational stress due to the poor health of her husband with lung cancer  Past Medical History:  Diagnosis Date  . Aneurysm of ascending aorta ([place])    a. 4.2-4.3cm in 2014.  Marland Kitchen Elevated BP Transient 09/2014  . GERD (gastroesophageal reflux disease)   . H/O bone density study    2015  . H/O colonoscopy   . History of ovarian cancer    November 1979  . Insomnia   . Normal coronary arteries Cath - 09/2014  . Osteoporosis   . Raynaud's disease      Social History   Social History  . Marital status: Married    Spouse name: Publishing copy  . Number of children: 2  . Years of education: N/A   Occupational History  . retired Retired   Social History Main Topics  . Smoking status: Never Smoker  . Smokeless tobacco: Never Used  . Alcohol use No  . Drug use: No  . Sexual activity: Not on file   Other Topics Concern  . Not on file   Social History Narrative   She and her husband are from the [place] of [place].`   Children: [name] Corliss age 76, lives in [place], [place], age 62, lives in [place], [place]    Past Surgical History:  Procedure Laterality Date  . ABDOMINAL HYSTERECTOMY    . APPENDECTOMY    . BILATERAL SALPINGOOPHORECTOMY    . LEFT HEART CATHETERIZATION WITH CORONARY ANGIOGRAM N/A 10/17/2014   Procedure: LEFT HEART CATHETERIZATION WITH CORONARY ANGIOGRAM;  Surgeon: [name], MD;  Location: [facility] CATH LAB;  Service: Cardiovascular;  Laterality: N/A;  . ovarian ca s/p taj/bso  1979    Family History  Problem Relation Age of Onset  .  Healthy Mother   . Healthy Brother     Allergies  Allergen Reactions  . Chlorhexidine Gluconate Rash    Current Outpatient Prescriptions on File Prior to Visit  Medication Sig Dispense Refill  . cetirizine (ZYRTEC) 10 MG tablet Take 10 mg by mouth daily as needed for allergies.     Marland Kitchen esomeprazole (NEXIUM) 40 MG capsule Take 40 mg by mouth every other day.    . propranolol (INDERAL) 10 MG tablet Take 1 tablet (10 mg total) by mouth as needed (Palpitations). 30 tablet 6   No current facility-administered medications on file prior to visit.     BP 112/70 (BP Location: Left Arm, Patient Position: Sitting, Cuff Size: Normal)   Pulse 61   Temp 98.1 F (36.7 C) (Oral)   Resp 18   Ht 5\' 8"  (1.727 m)   Wt 145 lb (65.8 kg)   SpO2 99%   BMI 22.05 kg/m     Review of Systems  Constitutional: Negative.   HENT: Negative for congestion, dental problem, hearing loss, rhinorrhea, sinus pressure, sore throat and tinnitus.   Eyes: Negative for pain, discharge and visual disturbance.  Respiratory: Negative for cough and shortness of breath.   Cardiovascular: Negative for chest pain, palpitations and leg swelling.  Gastrointestinal:  Negative for abdominal distention, abdominal pain, blood in stool, constipation, diarrhea, nausea and vomiting.  Genitourinary: Negative for difficulty urinating, dysuria, flank pain, frequency, hematuria, pelvic pain, urgency, vaginal bleeding, vaginal discharge and vaginal pain.  Musculoskeletal: Negative for arthralgias, gait problem and joint swelling.  Skin: Negative for rash.  Neurological: Negative for dizziness, syncope, speech difficulty, weakness, numbness and headaches.  Hematological: Negative for adenopathy.  Psychiatric/Behavioral: Negative for agitation, behavioral problems and dysphoric mood. The patient is nervous/anxious.        Objective:   Physical Exam  Constitutional: She is oriented to person, place, and time. She appears well-developed  and well-nourished.  Blood pressure low normal  HENT:  Head: Normocephalic.  Right Ear: External ear normal.  Left Ear: External ear normal.  Mouth/Throat: Oropharynx is clear and moist.  Eyes: Conjunctivae and EOM are normal. Pupils are equal, round, and reactive to light.  Neck: Normal range of motion. Neck supple. No thyromegaly present.  Cardiovascular: Normal rate, regular rhythm, normal heart sounds and intact distal pulses.   Pulmonary/Chest: Effort normal and breath sounds normal.  Abdominal: Soft. Bowel sounds are normal. She exhibits no mass. There is no tenderness.  Musculoskeletal: Normal range of motion.  Lymphadenopathy:    She has no cervical adenopathy.  Neurological: She is alert and oriented to person, place, and time.  Skin: Skin is warm and dry. No rash noted.  Psychiatric: She has a normal mood and affect. Her behavior is normal.          Assessment & Plan:    history of right breast carcinoma.  Follow-up [facility]  history of thoracic aortic aneurysm.  Follow-up vascular surgery  allergic rhinitis   annual exam in the spring as scheduled  Nyoka Cowden

## 2016-07-09 NOTE — Telephone Encounter (Signed)
Pt's instruction for  valACYclovir (VALTREX) 500 MG tablet  states 1 /week. Pt has always taken 1 /day. Pharmacy would like a call back to clarify instructions.  [facility]/ [address]

## 2016-07-09 NOTE — Progress Notes (Signed)
Pre visit review using our clinic review tool, if applicable. No additional management support is needed unless otherwise documented below in the visit note. 

## 2016-07-09 NOTE — Patient Instructions (Signed)
Limit your sodium (Salt) intake    It is important that you exercise regularly, at least 20 minutes 3 to 4 times per week.  If you develop chest pain or shortness of breath seek  medical attention.  Return in 6 months for follow-up  

## 2016-07-18 DIAGNOSIS — D225 Melanocytic nevi of trunk: Secondary | ICD-10-CM | POA: Diagnosis not present

## 2016-07-18 DIAGNOSIS — D2230 Melanocytic nevi of unspecified part of face: Secondary | ICD-10-CM | POA: Diagnosis not present

## 2016-07-18 DIAGNOSIS — D2271 Melanocytic nevi of right lower limb, including hip: Secondary | ICD-10-CM | POA: Diagnosis not present

## 2016-07-18 DIAGNOSIS — L818 Other specified disorders of pigmentation: Secondary | ICD-10-CM | POA: Diagnosis not present

## 2016-07-18 DIAGNOSIS — D2262 Melanocytic nevi of left upper limb, including shoulder: Secondary | ICD-10-CM | POA: Diagnosis not present

## 2016-07-18 DIAGNOSIS — D224 Melanocytic nevi of scalp and neck: Secondary | ICD-10-CM | POA: Diagnosis not present

## 2016-07-18 DIAGNOSIS — Z23 Encounter for immunization: Secondary | ICD-10-CM | POA: Diagnosis not present

## 2016-07-18 DIAGNOSIS — D2371 Other benign neoplasm of skin of right lower limb, including hip: Secondary | ICD-10-CM | POA: Diagnosis not present

## 2016-07-18 DIAGNOSIS — Z86018 Personal history of other benign neoplasm: Secondary | ICD-10-CM | POA: Diagnosis not present

## 2016-07-18 DIAGNOSIS — D2261 Melanocytic nevi of right upper limb, including shoulder: Secondary | ICD-10-CM | POA: Diagnosis not present

## 2016-07-18 DIAGNOSIS — D2272 Melanocytic nevi of left lower limb, including hip: Secondary | ICD-10-CM | POA: Diagnosis not present

## 2016-07-18 DIAGNOSIS — D2239 Melanocytic nevi of other parts of face: Secondary | ICD-10-CM | POA: Diagnosis not present

## 2016-08-21 ENCOUNTER — Other Ambulatory Visit: Payer: Self-pay | Admitting: *Deleted

## 2016-09-01 ENCOUNTER — Other Ambulatory Visit: Payer: Self-pay | Admitting: *Deleted

## 2016-09-01 MED ORDER — VALACYCLOVIR HCL 500 MG PO TABS: 500.0000 mg | ORAL_TABLET | ORAL | 3 refills | Status: DC

## 2016-09-22 DIAGNOSIS — C50919 Malignant neoplasm of unspecified site of unspecified female breast: Secondary | ICD-10-CM

## 2016-09-22 HISTORY — DX: Malignant neoplasm of unspecified site of unspecified female breast: C50.919

## 2016-09-22 HISTORY — PX: MASTECTOMY: SHX3

## 2016-11-13 ENCOUNTER — Other Ambulatory Visit: Payer: Self-pay | Admitting: *Deleted

## 2016-11-13 DIAGNOSIS — I712 Thoracic aortic aneurysm, without rupture, unspecified: Secondary | ICD-10-CM

## 2016-12-10 ENCOUNTER — Ambulatory Visit (INDEPENDENT_AMBULATORY_CARE_PROVIDER_SITE_OTHER): Payer: Medicare Other | Admitting: Surgery

## 2016-12-10 ENCOUNTER — Encounter: Payer: Self-pay | Admitting: Surgery

## 2016-12-10 ENCOUNTER — Ambulatory Visit
Admission: RE | Admit: 2016-12-10 | Discharge: 2016-12-10 | Disposition: A | Discharge status: Home / Self Care | Payer: Medicare Other | Source: Ambulatory Visit | Attending: Surgery | Admitting: Surgery

## 2016-12-10 VITALS — BP 140/85 | HR 70 | Resp 20 | Ht 68.0 in | Wt 145.0 lb

## 2016-12-10 DIAGNOSIS — I712 Thoracic aortic aneurysm, without rupture, unspecified: Secondary | ICD-10-CM

## 2016-12-10 DIAGNOSIS — I7121 Aneurysm of the ascending aorta, without rupture: Secondary | ICD-10-CM

## 2016-12-10 MED ORDER — GADOBENATE DIMEGLUMINE 529 MG/ML IV SOLN
13.0000 mL | Freq: Once | INTRAVENOUS | Status: AC | PRN
  Administered 2016-12-10: 13 mL via INTRAVENOUS

## 2016-12-10 NOTE — Progress Notes (Signed)
     HPI:  She returns today for follow up of an ascending aortic aneurysm. Her last MRA was 2 years ago and the diameter was 4.2 cm which was unchanged dating back to 09/06/2004. Since I last saw her she has undergone lumpectomy for right breast cancer. She feels well overall with no chest or back pain.  Current Outpatient Prescriptions  Medication Sig Dispense Refill  . cetirizine (ZYRTEC) 10 MG tablet Take 10 mg by mouth daily as needed for allergies.     Marland Kitchen esomeprazole (NEXIUM) 40 MG capsule Take 40 mg by mouth every other day.    . valACYclovir (VALTREX) 500 MG tablet Take 1 tablet (500 mg total) by mouth once a week. 12 tablet 3   No current facility-administered medications for this visit.      Physical Exam: BP 140/85   Pulse 70   Resp 20   Ht 5\' 8"  (1.727 m)   Wt 145 lb (65.8 kg)   BMI 22.05 kg/m  She looks well Cardiac exam shows a regular rate and rhythm with normal heart sounds Lungs are clear  Diagnostic Tests:  CLINICAL DATA:  Aortic aneurysm, follow-up  EXAM: MRA CHEST WITH OR WITHOUT CONTRAST  TECHNIQUE: Angiographic images of the chest were obtained using MRA technique with intravenous contrast.  CONTRAST:  62mL MULTIHANCE GADOBENATE DIMEGLUMINE 529 MG/ML IV SOLN  Creatinine was obtained on site at [place] at [address]  Results: Creatinine 0.6 mg/dL.  GFR 86.  COMPARISON:  11/29/2014 and previous  FINDINGS: VASCULAR  Aorta: No significant atheromatous irregularity, dissection, or stenosis. Ascending aneurysm with luminal diameter measurements as follows:  3.3 cm sinuses of Valsalva.  3 cm sino-tubular junction  4.1 cm mid ascending (previously 4.2)  3.6 cm distal ascending/proximal arch  2.6 cm distal arch/proximal descending  2.2 cm distal descending  Classic 3 vessel brachiocephalic arterial origin anatomy without proximal stenosis.  Heart: Unremarkable  Pulmonary Arteries: Unremarkable  centrally; limited evaluation of subsegmental branches.  Other: No pleural or pericardial effusion.  NON-VASCULAR  Spinal cord: Unremarkable limited evaluation  Brachial plexus: Unremarkable limited evaluation  Muscles and tendons: No acute findings  Bones: Probable benign hemangiomas in mid thoracic vertebral bodies, stable. No fracture or worrisome lesion.  Joints: No acute findings  IMPRESSION: VASCULAR  1. No enlargement of mild 4.1 cm ascending aortic aneurysm, without complicating features.  NON-VASCULAR  1. No acute findings   Electronically Signed   By: Lucrezia Europe M.D.   On: 12/10/2016 15:13   Impression:  She has a stable fusiform ascending aortic aneurysm that is 4.1 cm in greatest diameter. This does not require any surgical treatment at this time but should be followed. Her BP is elevated today but she says it is usually under good control. This aneurysm has been stable since 2005 so I think a followup scan in 2 years should be fine at this size.  Plan:  I will see her back in 2 years with an MRA of the chest.  Gaye Pollack, MD [facility] [phone]

## 2016-12-26 ENCOUNTER — Encounter: Payer: Self-pay | Admitting: *Deleted

## 2017-01-07 ENCOUNTER — Ambulatory Visit (INDEPENDENT_AMBULATORY_CARE_PROVIDER_SITE_OTHER): Payer: Medicare Other | Admitting: Internal Medicine

## 2017-01-07 ENCOUNTER — Encounter: Payer: Self-pay | Admitting: Internal Medicine

## 2017-01-07 VITALS — BP 122/66 | HR 65 | Temp 97.6°F | Wt 144.4 lb

## 2017-01-07 DIAGNOSIS — I712 Thoracic aortic aneurysm, without rupture, unspecified: Secondary | ICD-10-CM

## 2017-01-07 DIAGNOSIS — M545 Low back pain, unspecified: Secondary | ICD-10-CM

## 2017-01-07 DIAGNOSIS — G8929 Other chronic pain: Secondary | ICD-10-CM

## 2017-01-07 DIAGNOSIS — M5030 Other cervical disc degeneration, unspecified cervical region: Secondary | ICD-10-CM | POA: Diagnosis not present

## 2017-01-07 NOTE — Progress Notes (Signed)
Pre visit review using our clinic review tool, if applicable. No additional management support is needed unless otherwise documented below in the visit note. 

## 2017-01-07 NOTE — Patient Instructions (Addendum)
WE NOW OFFER   [facility] FAST TRACK!!!  SAME DAY Appointments for ACUTE CARE  Such as: Sprains, Injuries, cuts, abrasions, rashes, muscle pain, joint pain, back pain Colds, flu, sore throats, headache, allergies, cough, fever  Ear pain, sinus and eye infections Abdominal pain, nausea, vomiting, diarrhea, upset stomach Animal/insect bites  3 Easy Ways to Schedule: Walk-In Scheduling Call in scheduling Mychart Sign-up: [email or web address]     It is important that you exercise regularly, at least 20 minutes 3 to 4 times per week.  If you develop chest pain or shortness of breath seek  medical attention.  Take a calcium supplement, plus [phone] units of vitamin D  Return in 6 months for follow-up

## 2017-01-07 NOTE — Progress Notes (Signed)
Subjective:    Patient ID: Cindy Schwartz, female    DOB: 11-15-1933, 83 y.o.   MRN: [id number]  HPI  83 year old patient who is seen today for her biannual follow-up.  She has been seen recently and follow-up by vascular surgery due to a thoracic aortic artery aneurysm. She has a history of osteoporosis and apparently has bone density studies every other year.  She has been treated with Fosamax in the past, but presently is on vitamin D only She has a mild allergy issues but otherwise doing quite well.  Past Medical History:  Diagnosis Date  . Aneurysm of ascending aorta ([place])    a. 4.2-4.3cm in 2014.  Marland Kitchen Elevated BP Transient 09/2014  . GERD (gastroesophageal reflux disease)   . H/O bone density study    2015  . H/O colonoscopy   . History of ovarian cancer    November 1979  . Insomnia   . Normal coronary arteries Cath - 09/2014  . Osteoporosis   . Raynaud's disease      Social History   Social History  . Marital status: Married    Spouse name: Publishing copy  . Number of children: 2  . Years of education: N/A   Occupational History  . retired Retired   Social History Main Topics  . Smoking status: Never Smoker  . Smokeless tobacco: Never Used  . Alcohol use No  . Drug use: No  . Sexual activity: Not on file   Other Topics Concern  . Not on file   Social History Narrative   She and her husband are from the [place] of [place].`   Children: [name] [place] age 65, lives in [place], [place], age 22, lives in [place], [place]    Past Surgical History:  Procedure Laterality Date  . ABDOMINAL HYSTERECTOMY    . APPENDECTOMY    . BILATERAL SALPINGOOPHORECTOMY    . LEFT HEART CATHETERIZATION WITH CORONARY ANGIOGRAM N/A 10/17/2014   Procedure: LEFT HEART CATHETERIZATION WITH CORONARY ANGIOGRAM;  Surgeon: [name], MD;  Location: [facility] CATH LAB;  Service: Cardiovascular;  Laterality: N/A;  . ovarian ca s/p taj/bso  1979    Family History  Problem  Relation Age of Onset  . Healthy Mother   . Healthy Brother     Allergies  Allergen Reactions  . Chlorhexidine Gluconate Rash    Current Outpatient Prescriptions on File Prior to Visit  Medication Sig Dispense Refill  . cetirizine (ZYRTEC) 10 MG tablet Take 10 mg by mouth daily as needed for allergies.     Marland Kitchen esomeprazole (NEXIUM) 40 MG capsule Take 40 mg by mouth every other day.    . valACYclovir (VALTREX) 500 MG tablet Take 1 tablet (500 mg total) by mouth once a week. 12 tablet 3   No current facility-administered medications on file prior to visit.     BP 122/66 (BP Location: Left Arm, Patient Position: Sitting, Cuff Size: Normal)   Pulse 65   Temp 97.6 F (36.4 C) (Oral)   Wt 144 lb 6.4 oz (65.5 kg)   SpO2 95%   BMI 21.96 kg/m     Review of Systems  Constitutional: Negative.   HENT: Positive for postnasal drip and rhinorrhea. Negative for congestion, dental problem, hearing loss, sinus pressure, sore throat and tinnitus.   Eyes: Negative for pain, discharge and visual disturbance.  Respiratory: Negative for cough and shortness of breath.   Cardiovascular: Negative for chest pain, palpitations and leg swelling.  Gastrointestinal: Negative for abdominal distention, abdominal pain, blood in stool, constipation, diarrhea, nausea and vomiting.  Genitourinary: Negative for difficulty urinating, dysuria, flank pain, frequency, hematuria, pelvic pain, urgency, vaginal bleeding, vaginal discharge and vaginal pain.  Musculoskeletal: Negative for arthralgias, gait problem and joint swelling.  Skin: Negative for rash.  Neurological: Negative for dizziness, syncope, speech difficulty, weakness, numbness and headaches.  Hematological: Negative for adenopathy.  Psychiatric/Behavioral: Negative for agitation, behavioral problems and dysphoric mood. The patient is not nervous/anxious.        Objective:   Physical Exam  Constitutional: She is oriented to person, place, and time.  She appears well-developed and well-nourished.  HENT:  Head: Normocephalic.  Right Ear: External ear normal.  Left Ear: External ear normal.  Mouth/Throat: Oropharynx is clear and moist.  Eyes: Conjunctivae and EOM are normal. Pupils are equal, round, and reactive to light.  Neck: Normal range of motion. Neck supple. No thyromegaly present.  Cardiovascular: Normal rate, regular rhythm, normal heart sounds and intact distal pulses.   Pulmonary/Chest: Effort normal and breath sounds normal.  Abdominal: Soft. Bowel sounds are normal. She exhibits no mass. There is no tenderness.  Musculoskeletal: Normal range of motion.  Lymphadenopathy:    She has no cervical adenopathy.  Neurological: She is alert and oriented to person, place, and time.  Skin: Skin is warm and dry. No rash noted.  Psychiatric: She has a normal mood and affect. Her behavior is normal.          Assessment & Plan:   History of osteoporosis.  Patient was asked to make sure we get a copy of her latest bone density study.  Continue vitamin D and active lifestyle History of thoracic aortic aneurysm, stable Allergic rhinitis.  Zyrtec is caused some sedation.  We'll try Allegra or Alavert  CPX 6 months  Nyoka Cowden

## 2017-01-11 NOTE — Progress Notes (Signed)
01/14/2017 Cindy Schwartz   27-Jan-1934  [id number]  Primary Physician Cindy Cowden, MD Primary Cardiologist: Dr. Johnsie Schwartz    Reason for Visit/CC: Dizziness, lightheadedness  HPI:  F/U palpitations, dizziness and aortic root dilatation   She has a history of stable fusiform dilatation of aortic root 4.2 cm. She  was admitted to the hospital last year in Jan 2016 with chest pain associated with blood pressures of 171/109 which was unusual for her. Cardiac catheterization showed normal coronary arteries, normal LV function and mild to moderate dilatation of the ascending aorta without evidence of dissection. She also has a history of a thoracic aortic aneurysm last measured in 2014 at 4.2-4.3 cm followed by Dr. Cyndia Schwartz. Event monitor was neg last year. NSR with PACs/PVCs and 8 beat run of SVT. She has had a lot of stress recently. Her husband was diagnosed with lung cancer and she was just diagnosed with breast cancer. She is s/p lumpectomy. He is undergoing chemo.  She reports that she has had several brief episodes of mild dizziness and lightheadedness.  She only take propanolol PRN for palpitations, but has not required use recently. She reports good PO intake. She does admit that she continues to be under a great deal of stress given her husband's Cindy Schwartz's lung cancer   She is currently asymptomatic in clinic today. She is in no acute distress. Orthostatic vital signs were checked and are normal   EKG shows sinus rhythm with first-degree AV block. 65 bpm.  Her husband can no longer drive and has a hard time traveling       Current Meds  Medication Sig  . cetirizine (ZYRTEC) 10 MG tablet Take 10 mg by mouth daily as needed for allergies.   Marland Kitchen esomeprazole (NEXIUM) 40 MG capsule Take 40 mg by mouth every other day.  . valACYclovir (VALTREX) 500 MG tablet Take 1 tablet (500 mg total) by mouth once a week.   Allergies  Allergen Reactions  . Chlorhexidine Gluconate Rash   Past  Medical History:  Diagnosis Date  . Aneurysm of ascending aorta ([place])    a. 4.2-4.3cm in 2014.  Marland Kitchen Elevated BP Transient 09/2014  . GERD (gastroesophageal reflux disease)   . H/O bone density study    2015  . H/O colonoscopy   . History of ovarian cancer    November 1979  . Insomnia   . Normal coronary arteries Cath - 09/2014  . Osteoporosis   . Raynaud's disease    Family History  Problem Relation Age of Onset  . Healthy Mother   . Healthy Brother    Past Surgical History:  Procedure Laterality Date  . ABDOMINAL HYSTERECTOMY    . APPENDECTOMY    . BILATERAL SALPINGOOPHORECTOMY    . LEFT HEART CATHETERIZATION WITH CORONARY ANGIOGRAM N/A 10/17/2014   Procedure: LEFT HEART CATHETERIZATION WITH CORONARY ANGIOGRAM;  Surgeon: Cindy Sine, MD;  Location: [facility] CATH LAB;  Service: Cardiovascular;  Laterality: N/A;  . ovarian ca s/p taj/bso  1979   Social History   Social History  . Marital status: Married    Spouse name: Cindy Schwartz  . Number of children: 2  . Years of education: N/A   Occupational History  . retired Retired   Social History Main Topics  . Smoking status: Never Smoker  . Smokeless tobacco: Never Used  . Alcohol use No  . Drug use: No  . Sexual activity: Not on file   Other Topics Concern  . Not on file  Social History Narrative   She and her husband are from the [place] of [place].`   Children: Cindy Schwartz age 22, lives in [place], [place], age 24, lives in [place], [place]     Review of Systems: General: negative for chills, fever, night sweats or weight changes.  Cardiovascular: negative for chest pain, dyspnea on exertion, edema, orthopnea, palpitations, paroxysmal nocturnal dyspnea or shortness of breath Dermatological: negative for rash Respiratory: negative for cough or wheezing Urologic: negative for hematuria Abdominal: negative for nausea, vomiting, diarrhea, bright red blood per rectum, melena, or  hematemesis Neurologic: negative for visual changes, syncope, or dizziness All other systems reviewed and are otherwise negative except as noted above.   Physical Exam:  Blood pressure 120/64, pulse 73, height 5\' 8"  (1.727 m), weight 144 lb (65.3 kg), SpO2 98 %.  Affect appropriate Cindy Schwartz european female  HEENT: normal Neck supple with no adenopathy JVP normal no bruits no thyromegaly Lungs clear with no wheezing and good diaphragmatic motion Heart:  S1/S2 1/6 SEM murmur, no rub, gallop or click PMI normal Abdomen: benighn, BS positve, no tenderness, no AAA no bruit.  No HSM or HJR Distal pulses intact with no bruits No edema Neuro non-focal Skin warm and dry No muscular weakness   EKG SR with 1st degree AVB. RBBB.   ASSESSMENT AND PLAN:   1. Dizziness/ Lightheadedness: Recent spells lasting only seconds. No associated chest pain, palpitations, dyspnea or syncope.  She had a negative left heart catheterization in 2016. Evaluation by Holter monitors in the past have revealed PACs/PVCs. EKG today demonstrates sinus rhythm with first-degree AV block with a heart rate of 65 bpm. Orthostatic vital signs are negative. Physical exam is unremarkable.  CBC and BMET are fine Observe  2. H/o PVCs/PAC: patent denies any recent palpiations. She takes propanolol PRN. Has not required any recent use.   3. Breast CA: s/p lumpectomy. Not undergoing any additional therapy.   4. Stress: patient under great deal of emotional stress, as her husband is undergoing Tx for Lung CA. Patient advised to maintain good hydration and rest, if possible.   5. Thoracic aortic aneurysm: followed by Dr. Cyndia Schwartz.   6. Murmur:  Benign aortic sclerosis murmur Echo 2016 mild MR no AS     PLAN  She has her 6 month f/u with me   Jenkins Rouge

## 2017-01-14 ENCOUNTER — Ambulatory Visit (INDEPENDENT_AMBULATORY_CARE_PROVIDER_SITE_OTHER): Payer: Medicare Other | Admitting: Cardiovascular Disease

## 2017-01-14 ENCOUNTER — Encounter: Payer: Self-pay | Admitting: Cardiovascular Disease

## 2017-01-14 VITALS — BP 120/64 | HR 73 | Ht 68.0 in | Wt 144.0 lb

## 2017-01-14 DIAGNOSIS — R002 Palpitations: Secondary | ICD-10-CM

## 2017-01-14 NOTE — Patient Instructions (Signed)

## 2017-01-31 ENCOUNTER — Other Ambulatory Visit: Payer: Self-pay | Admitting: Internal Medicine

## 2017-02-13 ENCOUNTER — Ambulatory Visit: Payer: Medicare Other | Admitting: Family Medicine

## 2017-02-13 ENCOUNTER — Encounter: Payer: Self-pay | Admitting: Family Medicine

## 2017-02-13 ENCOUNTER — Ambulatory Visit (INDEPENDENT_AMBULATORY_CARE_PROVIDER_SITE_OTHER): Payer: Medicare Other | Admitting: Family Medicine

## 2017-02-13 VITALS — BP 110/68 | HR 70 | Temp 98.5°F | Wt 144.1 lb

## 2017-02-13 DIAGNOSIS — S39012A Strain of muscle, fascia and tendon of lower back, initial encounter: Secondary | ICD-10-CM

## 2017-02-13 NOTE — Patient Instructions (Signed)
Low Back Sprain A sprain is a stretch or tear in the bands of tissue that hold bones and joints together (ligaments). Sprains of the lower back (lumbar spine) are a common cause of low back pain. A sprain occurs when ligaments are overextended or stretched beyond their limits. The ligaments can become inflamed, resulting in pain and sudden muscle tightening (spasms). A sprain can be caused by an injury (trauma), or it can develop gradually due to overuse. There are three types of sprains:  Grade 1 is a mild sprain involving an overstretched ligament or a very slight tear of the ligament.  Grade 2 is a moderate sprain involving a partial tear of the ligament.  Grade 3 is a severe sprain involving a complete tear of the ligament. What are the causes? This condition may be caused by:  Trauma, such as a fall or a hit to the body.  Twisting or overstretching the back. This may result from doing activities that require a lot of energy, such as lifting heavy objects. What increases the risk? The following factors may increase your risk of getting this condition:  Playing contact sports.  Participating in sports or activities that put excessive stress on the back and require a lot of bending and twisting, including:  Lifting weights or heavy objects.  Gymnastics.  Soccer.  Figure skating.  Snowboarding.  Being overweight or obese.  Having poor strength and flexibility. What are the signs or symptoms? Symptoms of this condition may include:  Sharp or dull pain in the lower back that does not go away. Pain may extend to the buttocks.  Stiffness.  Limited range of motion.  Inability to stand up straight due to stiffness or pain.  Muscle spasms. How is this diagnosed?   This condition may be diagnosed based on:  Your symptoms.  Your medical history.  A physical exam.  Your health care provider may push on certain areas of your back to determine the source of your  pain.  You may be asked to bend forward, backward, and side to side to assess the severity of your pain and your range of motion.  Imaging tests, such as:  X-rays.  MRI. How is this treated? Treatment for this condition may include:  Applying heat and cold to the affected area.  Medicines to help relieve pain and to relax your muscles (muscle relaxants).  NSAIDs to help reduce swelling and discomfort.  Physical therapy. When your symptoms improve, it is important to gradually return to your normal routine as soon as possible to reduce pain, avoid stiffness, and avoid loss of muscle strength. Generally, symptoms should improve within 6 weeks of treatment. However, recovery time varies. Follow these instructions at home: Managing pain, stiffness, and swelling   If directed, apply ice to the injured area during the first 24 hours after your injury.  Put ice in a plastic bag.  Place a towel between your skin and the bag.  Leave the ice on for 20 minutes, 2-3 times a day.  If directed, apply heat to the affected area as often as told by your health care provider. Use the heat source that your health care provider recommends, such as a moist heat pack or a heating pad.  Place a towel between your skin and the heat source.  Leave the heat on for 20-30 minutes.  Remove the heat if your skin turns bright red. This is especially important if you are unable to feel pain, heat, or cold. You  may have a greater risk of getting burned. Activity   Rest and return to your normal activities as told by your health care provider. Ask your health care provider what activities are safe for you.  Avoid activities that take a lot of effort (are strenuous) for as long as told by your health care provider.  Do exercises as told by your health care provider. General instructions    Take over-the-counter and prescription medicines only as told by your health care provider.  If you have  questions or concerns about safety while taking pain medicine, talk with your health care provider.  Do not drive or operate heavy machinery until you know how your pain medicine affects you.  Do not use any tobacco products, such as cigarettes, chewing tobacco, and e-cigarettes. Tobacco can delay bone healing. If you need help quitting, ask your health care provider.  Keep all follow-up visits as told by your health care provider. This is important. How is this prevented?  Warm up and stretch before being active.  Cool down and stretch after being active.  Give your body time to rest between periods of activity.  Avoid:  Being physically inactive for long periods at a time.  Exercising or playing sports when you are tired or in pain.  Use correct form when playing sports and lifting heavy objects.  Use good posture when sitting and standing.  Maintain a healthy weight.  Sleep on a mattress with medium firmness to support your back.  Make sure to use equipment that fits you, including shoes that fit well.  Be safe and responsible while being active to avoid falls.  Do at least 150 minutes of moderate-intensity exercise each week, such as brisk walking or water aerobics. Try a form of exercise that takes stress off your back, such as swimming or stationary cycling.  Maintain physical fitness, including:  Strength. In particular, develop and maintain strong abdominal muscles.  Flexibility.  Cardiovascular fitness.  Endurance. Contact a health care provider if:  Your back pain does not improve after 6 weeks of treatment.  Your symptoms get worse. Get help right away if:  Your back pain is severe.  You are unable to stand or walk.  You develop pain in your legs.  You develop weakness in your buttocks or legs.  You have difficulty controlling when you urinate or when you have a bowel movement. This information is not intended to replace advice given to you by  your health care provider. Make sure you discuss any questions you have with your health care provider. Document Released: 09/08/2005 Document Revised: 05/15/2016 Document Reviewed: 06/20/2015 Elsevier Interactive Patient Education  2017 [employer].  We will set up physical therapy.

## 2017-02-13 NOTE — Progress Notes (Signed)
Subjective:     Patient ID: Cindy Schwartz, female   DOB: Feb 04, 1934, 83 y.o.   MRN: [id number]  HPI Patient seen with two and one haf week history of back pain in her lower lumbar region. She recalls reaching and twisting her back couple of weeks ago when onset occurred. She has had no radiculopathy symptoms. Pain is worse with back flexion. She went to chiropractor 3 times last week without much improvement. Denies any chronic back difficulties. No fevers or chills. No dysuria. Denies any lower extremity numbness or weakness. She tried some ibuprofen with some temporary relief and heat also helps slightly.  Past Medical History:  Diagnosis Date  . Aneurysm of ascending aorta ([place])    a. 4.2-4.3cm in 2014.  Marland Kitchen Elevated BP Transient 09/2014  . GERD (gastroesophageal reflux disease)   . H/O bone density study    2015  . H/O colonoscopy   . History of ovarian cancer    November 1979  . Insomnia   . Normal coronary arteries Cath - 09/2014  . Osteoporosis   . Raynaud's disease    Past Surgical History:  Procedure Laterality Date  . ABDOMINAL HYSTERECTOMY    . APPENDECTOMY    . BILATERAL SALPINGOOPHORECTOMY    . LEFT HEART CATHETERIZATION WITH CORONARY ANGIOGRAM N/A 10/17/2014   Procedure: LEFT HEART CATHETERIZATION WITH CORONARY ANGIOGRAM;  Surgeon: [name], MD;  Location: [facility] CATH LAB;  Service: Cardiovascular;  Laterality: N/A;  . ovarian ca s/p taj/bso  1979    reports that she has never smoked. She has never used smokeless tobacco. She reports that she does not drink alcohol or use drugs. family history includes Healthy in her brother and mother. Allergies  Allergen Reactions  . Chlorhexidine Gluconate Rash     Review of Systems  Constitutional: Negative for appetite change, chills, fever and unexpected weight change.  Respiratory: Negative for shortness of breath.   Gastrointestinal: Negative for abdominal pain.  Musculoskeletal: Positive for back pain.  Neurological:  Negative for weakness and numbness.       Objective:   Physical Exam  Constitutional: She appears well-developed and well-nourished.  Musculoskeletal: She exhibits no edema.  Straight leg raises are negative bilaterally  Neurological:  2+ reflex ankle and knee bilaterally. Full-strength lower extremities. Full sensory throughout       Assessment:     Bilateral lumbar back pain. Suspect acute strain    Plan:     -Continue heat as needed for symptomatically -Continue walking as tolerated but avoid any back flexion repetitively or heavy lifting -Set up physical therapy -would try to avoid muscle relaxer given her age.  Eulas Post MD [facility] at [facility]

## 2017-02-17 ENCOUNTER — Ambulatory Visit: Payer: Medicare Other | Attending: Family Medicine

## 2017-02-17 DIAGNOSIS — R293 Abnormal posture: Secondary | ICD-10-CM | POA: Insufficient documentation

## 2017-02-17 DIAGNOSIS — M545 Low back pain, unspecified: Secondary | ICD-10-CM

## 2017-02-17 NOTE — Therapy (Signed)
[facility] [address] [place], [place], [address] Phone: [phone]   Fax:  [phone]  Physical Therapy Evaluation  Patient Details  Name: Cindy Schwartz MRN: [id number] Date of Birth: [birth year] Referring Provider: Carolann Littler, MD  Encounter Date: 02/17/2017      PT End of Session - 02/17/17 1131    Visit Number 1   Number of Visits 10   Date for PT Re-Evaluation 04/14/17   PT Start Time 1050   PT Stop Time 1135   PT Time Calculation (min) 45 min   Activity Tolerance Patient tolerated treatment well   Behavior During Therapy [facility] for tasks assessed/performed      Past Medical History:  Diagnosis Date  . Aneurysm of ascending aorta ([place])    a. 4.2-4.3cm in 2014.  . Cancer ([place]) 1980   ovarian cancer  . Elevated BP Transient 09/2014  . GERD (gastroesophageal reflux disease)   . H/O bone density study    2015  . H/O colonoscopy   . History of ovarian cancer    November 1979  . Insomnia   . Normal coronary arteries Cath - 09/2014  . Osteoporosis   . Raynaud's disease     Past Surgical History:  Procedure Laterality Date  . ABDOMINAL HYSTERECTOMY    . APPENDECTOMY    . BILATERAL SALPINGOOPHORECTOMY    . LEFT HEART CATHETERIZATION WITH CORONARY ANGIOGRAM N/A 10/17/2014   Procedure: LEFT HEART CATHETERIZATION WITH CORONARY ANGIOGRAM;  Surgeon: [name], MD;  Location: [facility] CATH LAB;  Service: Cardiovascular;  Laterality: N/A;  . ovarian ca s/p taj/bso  1979    There were no vitals filed for this visit.       Subjective Assessment - 02/17/17 1057    Subjective Pt presents to PT with complaints of 3 week history of LBP that began when she opened a window with one arm and then she twisted with cleaning a week later and exaccerbated the pain.  Pt went to the chiropractor 3 times without relief.  Pt has travel plans at the end of June to travel to [place].     Pertinent History ovarian cancer- no  [place], osteoporosis   Limitations Sitting   How long can you sit comfortably? 20 minutes   How long can you walk comfortably? 15 minutes   Diagnostic tests none   Patient Stated Goals reduce LBP, sit longer, travel without pain.    Currently in Pain? Yes   Pain Score 6    Pain Location Back   Pain Orientation Right;Left;Lower  Lt>Rt   Pain Descriptors / Indicators Aching;Burning   Pain Type Acute pain   Pain Radiating Towards bil anterior hips   Pain Onset 1 to 4 weeks ago   Pain Frequency Intermittent   Aggravating Factors  sitting too long, standing too long, movement   Pain Relieving Factors in bed at night            [facility] PT Assessment - 02/17/17 0001      Assessment   Medical Diagnosis strain of lumbar region   Referring Provider Carolann Littler, MD   Onset Date/Surgical Date 01/27/17   Next MD Visit as needed   Prior Therapy 3 visit to chiorpractor     Precautions   Precautions Other (comment)  cancer, osteoporosis   Precaution Comments no [place]     Restrictions   Weight Bearing Restrictions No     Balance Screen   Has the patient  fallen in the past 6 months No   Has the patient had a decrease in activity level because of a fear of falling?  No   Is the patient reluctant to leave their home because of a fear of falling?  No     Home Environment   Living Environment Private residence   Living Arrangements Spouse/significant other   Type of [place] to enter   Home Layout Two level     Prior Function   Level of [place] Retired   Leisure walking for exercise, housework     Cognition   Overall Cognitive Status Within Functional Limits for tasks assessed     Observation/Other Assessments   Focus on Therapeutic Outcomes (FOTO)  68% limitation     Posture/Postural Control   Posture/Postural Control Postural limitations   Postural Limitations Decreased lumbar lordosis;Forward head     ROM / Strength    AROM / PROM / Strength AROM;PROM;Strength     AROM   Overall AROM  Deficits   Overall AROM Comments Lumbar A/ROM limited by 25% with pain with flexion and Rt sidebending.  Hip flexibility limited by 25% bilaterally without pain     PROM   Overall PROM  Deficits   Overall PROM Comments bil hip flexibility limited by 25%      Strength   Overall Strength Within functional limits for tasks performed   Overall Strength Comments 4+/5 bil LE strength.  Pain with resisted hip flexion     Palpation   Spinal mobility reduced PA mobility T9-L5 without pain   Palpation comment tension and palpable tenderness over bil lumbar paraspinals, Rt proximal gluteals and hip flexors bilaterally     Transfers   Transfers Independent with all Transfers     Ambulation/Gait   Ambulation/Gait Yes   Gait Pattern Within Functional Limits            Objective measurements completed on examination: See above findings.                  PT Education - 02/17/17 1126    Education provided Yes   Education Details hip stretches, bridge   Person(s) Educated Patient   Methods Explanation;Handout;Demonstration   Comprehension Verbalized understanding;Returned demonstration          PT Short Term Goals - 02/17/17 1127      PT SHORT TERM GOAL #1   Title be independent in advanced HEP   Time 4   Status New     PT SHORT TERM GOAL #2   Title sit for 30-45 minutes without limitation   Time 4   Period Weeks   Status New     PT SHORT TERM GOAL #3   Title report a 25% reduction in LBP with housework   Time 4   Period Weeks   Status New     PT SHORT TERM GOAL #4   Title verbalize and demonstrate correct body mechanics techniques for lumbar protection with housework and ADLs   Time 4   Period Weeks   Status New     PT SHORT TERM GOAL #5   Title initiate walking program for exercise and understand safe progression   Time 4   Period Weeks   Status New           PT Long Term  Goals - 02/17/17 1051      PT LONG TERM GOAL #1   Title be  independent in advanced HEP   Time 8   Period Weeks   Status New     PT LONG TERM GOAL #2   Title reduce FOTO to < or = to 44% limitation   Time 8   Period Weeks   Status New     PT LONG TERM GOAL #3   Title sit without limitation due to LBP   Time 8   Period Weeks   Status New     PT LONG TERM GOAL #4   Title report a 70% reduction in LBP with housework and ADLs   Time 8   Period Weeks   Status New     PT LONG TERM GOAL #5   Title walk for exercise for 30 minutes without limitation due to pain   Time 8   Period Weeks   Status New                Plan - 02/17/17 1133    Clinical Impression Statement Pt presents to PT with complaints of LBP that began ~3 weeks ago when she opened a window.  Pain was exaccerbated when she twisted a week later.  No imaging has been done.  Pt demonstrates limited lumbar and hip A/ROM with pain.  Pt is limited to sitting 20 minutes and walking 15 minutes due to pain.  Pt with palpable tenderness over bil lumbar paraspinals and Rt proximal gluteals and bil hip flexors.  Pt will benefit from skilled PT for core strength, hip strength, hip flexibility and manual/modalities as needed.     History and Personal Factors relevant to plan of care: pt's husband has terminal cancer, pt history of osteoporosis.   Clinical Presentation Evolving   Clinical Presentation due to: pain began 3 weeks ago and is limiting houswork, sitting and exercise.  No relief in symtoms since they began 3 weeks ago   Clinical Decision Making Moderate   Rehab Potential Good   PT Frequency 2x / week   PT Duration 8 weeks   PT Treatment/Interventions ADLs/Self Care Home Management;Cryotherapy;Electrical Stimulation;Functional mobility training;Stair training;Gait training;Ultrasound;Therapeutic activities;Therapeutic exercise;Neuromuscular re-education;Patient/family education;Passive range of motion;Manual  techniques;Dry needling;Taping   PT Next Visit Plan body mechanics education for housework and ADLs, begin core strength, review HEP, manual and modalities   Consulted and Agree with Plan of Care Patient      Patient will benefit from skilled therapeutic intervention in order to improve the following deficits and impairments:  Decreased activity tolerance, Improper body mechanics, Impaired flexibility, Pain, Decreased endurance, Increased muscle spasms, Decreased range of motion  Visit Diagnosis: Acute bilateral low back pain without sciatica - Plan: PT plan of care cert/re-cert  Abnormal posture - Plan: PT plan of care cert/re-cert      G-Codes - 50/93/26 1051    Functional Assessment Tool Used (Outpatient Only) FOTO: 68% limitation   Functional Limitation Other PT primary   Other PT Primary Current Status (Z1245) At least 60 percent but less than 80 percent impaired, limited or restricted   Other PT Primary Goal Status (Y0998) At least 40 percent but less than 60 percent impaired, limited or restricted       Problem List Patient Active Problem List   Diagnosis Date Noted  . Breast cancer of lower-outer quadrant of right female breast ([place]) 01/16/2016  . Palpitations 11/13/2014  . Normal coronary arteries 10/17/2014  . Chest pain   . Varicose veins of lower extremities with other complications 33/82/5053  . Hx of herpes simplex  infection 12/18/2011  . COMPRESSION FRACTURE, THORACIC VERTEBRA 07/24/2010  . BACK PAIN, LUMBAR 06/28/2010  . NECK AND BACK PAIN 01/18/2010  . OSTEOARTHROSIS UNSPEC WHETHER GEN/LOC LOWER LEG 09/05/2009  . [place] REGION 02/02/2009  . GENERALIZED ANXIETY DISORDER 10/13/2008  . IRRITABLE BOWEL SYNDROME 10/13/2008  . HERPES SIMPLEX INFECTION 08/24/2008  . LUNG NODULE 11/25/2007  . Raynaud's syndrome 07/01/2007  . Aneurysm of thoracic aorta ([place]) 05/06/2007  . Allergic rhinitis 05/06/2007  . Esophageal reflux  05/06/2007  . DEGENERATIVE DISC DISEASE, CERVICAL SPINE 05/06/2007  . Osteoporosis 03/18/2007     Sigurd Sos, PT 02/17/17 11:45 AM  [facility] [address] [place], [place], [address] Phone: [phone]   Fax:  [phone]  Name: Cindy Schwartz MRN: [id number] Date of Birth: Nov 30, 1933

## 2017-02-17 NOTE — Patient Instructions (Addendum)
Knee to Chest (Flexion)    Pull knee toward chest. Feel stretch in lower back or buttock area. Breathing deeply, Hold __20__ seconds. Repeat with other knee. Repeat __3__ times. Do __3__ sessions per day.  http://gt2.exer.us/226   Copyright  VHI. All rights reserved.  Hip Flexor Stretch    Lying on back near edge of bed, bend one leg, foot flat. Hang other leg over edge, relaxed, thigh resting entirely on bed for 20 seconds. Repeat __3__ times. Do _3___ sessions per day. Advanced Exercise: Bend knee back keeping thigh in contact with bed.  http://gt2.exer.us/347   Copyright  VHI. All rights reserved.  HIP: Hamstrings - Short Sitting    Rest leg on raised surface. Keep knee straight. Lift chest. Hold 20___ seconds. 3___ reps per set, _3__ sets per day, ___ days per week  Copyright  VHI. All rights reserved.  Bridge    Lie back, legs bent. Inhale, pressing hips up. Keeping ribs in, lengthen lower back. Exhale, rolling down along spine from top. Repeat 5-10____ times. Do _1-2___ sessions per day.  http://pm.exer.us/55   Copyright  VHI. All rights reserved.  [facility] [address] [place], [place] [address] Phone # [phone] Fax [phone]

## 2017-02-19 ENCOUNTER — Encounter: Payer: Self-pay | Admitting: Physical Therapy

## 2017-02-19 ENCOUNTER — Ambulatory Visit: Payer: Medicare Other | Admitting: Physical Therapy

## 2017-02-19 DIAGNOSIS — M545 Low back pain, unspecified: Secondary | ICD-10-CM

## 2017-02-19 DIAGNOSIS — R293 Abnormal posture: Secondary | ICD-10-CM

## 2017-02-19 NOTE — Therapy (Signed)
[facility] [address], [place], [place], [address] Phone: [phone]   Fax:  [phone]  Physical Therapy Treatment  Patient Details  Name: Cindy Schwartz MRN: [id number] Date of Birth: 08-11-1934 Referring Provider: Carolann Littler, MD  Encounter Date: 02/19/2017      PT End of Session - 02/19/17 1148    Visit Number 2   Number of Visits 10   Date for PT Re-Evaluation 04/14/17   PT Start Time 1138   PT Stop Time 1225   PT Time Calculation (min) 47 min   Activity Tolerance Patient tolerated treatment well   Behavior During Therapy [facility] for tasks assessed/performed      Past Medical History:  Diagnosis Date  . Aneurysm of ascending aorta ([place])    a. 4.2-4.3cm in 2014.  . Cancer ([place]) 1980   ovarian cancer  . Elevated BP Transient 09/2014  . GERD (gastroesophageal reflux disease)   . H/O bone density study    2015  . H/O colonoscopy   . History of ovarian cancer    November 1979  . Insomnia   . Normal coronary arteries Cath - 09/2014  . Osteoporosis   . Raynaud's disease     Past Surgical History:  Procedure Laterality Date  . ABDOMINAL HYSTERECTOMY    . APPENDECTOMY    . BILATERAL SALPINGOOPHORECTOMY    . LEFT HEART CATHETERIZATION WITH CORONARY ANGIOGRAM N/A 10/17/2014   Procedure: LEFT HEART CATHETERIZATION WITH CORONARY ANGIOGRAM;  Surgeon: [name], MD;  Location: [facility] CATH LAB;  Service: Cardiovascular;  Laterality: N/A;  . ovarian ca s/p taj/bso  1979    There were no vitals filed for this visit.      Subjective Assessment - 02/19/17 1147    Subjective Patient reports back feeling a little better today. Most pain is still in front (pointing to obliques). Patient reports having difficutly with dressing with limtied forward flexion due to pain.    Pertinent History ovarian cancer- no [place], osteoporosis   Limitations Sitting   How long can you sit comfortably? 20 minutes   How long can  you walk comfortably? 15 minutes   Diagnostic tests none   Patient Stated Goals reduce LBP, sit longer, travel without pain.    Currently in Pain? Yes   Pain Score 5    Pain Location Back   Pain Orientation Right;Left;Lower   Pain Descriptors / Indicators Aching;Burning   Pain Type Acute pain                         [facility] Adult PT Treatment/Exercise - 02/19/17 0001      Lumbar Exercises: Standing   Shoulder Extension AROM;Both;20 reps;Theraband   Theraband Level (Shoulder Extension) Level 2 (Red)   Other Standing Lumbar Exercises Doorway stretch for abdominals  with contralateral hip extension and shoulder flexion     Lumbar Exercises: Supine   Ab Set 10 reps   Glut Set 10 reps  with ball squeeze   Clam 10 reps   Bent Knee Raise 10 reps   Straight Leg Raise 10 reps   Large Ball Abdominal Isometric 20 reps     Manual Therapy   Manual Therapy Soft tissue mobilization   Manual therapy comments Pt prone with pillow under hips   Soft tissue mobilization to Bil Lumbar paraspinals and Lt SI area                PT Education -  02/19/17 1154    Education provided Yes   Education Details Educational psychologist) Educated Patient   Methods Explanation;Demonstration;Handout   Comprehension Verbalized understanding          PT Short Term Goals - 02/19/17 1150      PT SHORT TERM GOAL #1   Title be independent in advanced HEP   Time 4   Period Weeks   Status On-going     PT SHORT TERM GOAL #2   Title sit for 30-45 minutes without limitation   Time 4   Period Weeks   Status On-going     PT SHORT TERM GOAL #3   Title report a 25% reduction in LBP with housework   Time 4   Period Weeks   Status On-going     PT SHORT TERM GOAL #4   Title verbalize and demonstrate correct body mechanics techniques for lumbar protection with housework and ADLs   Time 4   Period Weeks   Status On-going     PT SHORT TERM GOAL #5   Title initiate  walking program for exercise and understand safe progression   Time 4   Period Weeks   Status On-going           PT Long Term Goals - 02/17/17 1051      PT LONG TERM GOAL #1   Title be independent in advanced HEP   Time 8   Period Weeks   Status New     PT LONG TERM GOAL #2   Title reduce FOTO to < or = to 44% limitation   Time 8   Period Weeks   Status New     PT LONG TERM GOAL #3   Title sit without limitation due to LBP   Time 8   Period Weeks   Status New     PT LONG TERM GOAL #4   Title report a 70% reduction in LBP with housework and ADLs   Time 8   Period Weeks   Status New     PT LONG TERM GOAL #5   Title walk for exercise for 30 minutes without limitation due to pain   Time 8   Period Weeks   Status New               Plan - 02/19/17 1226    Clinical Impression Statement Patient reports back feeling a little better. Stated home exercises are helping. Patient reports tightness in front of stomach and in low back when leaning forward to dress. Discussed body mechanics and modified ways to dress to reduce pressue on low back. Patient able to tolerate all core activatoin exercises well with no increase in pain. Responded well to manual soft tissue mobilization to low back. Patient will continue to benefit from skilled therapy for core strengthening and management of pain symptoms to prepare for 24 day of travel next month.    History and Personal Factors relevant to plan of care: Pt husband has terminal cancer, Pt history of osteoporosis   Clinical Presentation Evolving   Rehab Potential Good   PT Frequency 2x / week   PT Duration 8 weeks   PT Treatment/Interventions ADLs/Self Care Home Management;Cryotherapy;Electrical Stimulation;Functional mobility training;Stair training;Gait training;Ultrasound;Therapeutic activities;Therapeutic exercise;Neuromuscular re-education;Patient/family education;Passive range of motion;Manual techniques;Dry needling;Taping    PT Next Visit Plan Review body mechanics, progress gentle strengthening and add stretches   Consulted and Agree with Plan of Care Patient  Patient will benefit from skilled therapeutic intervention in order to improve the following deficits and impairments:  Decreased activity tolerance, Improper body mechanics, Impaired flexibility, Pain, Decreased endurance, Increased muscle spasms, Decreased range of motion  Visit Diagnosis: Acute bilateral low back pain without sciatica  Abnormal posture     Problem List Patient Active Problem List   Diagnosis Date Noted  . Breast cancer of lower-outer quadrant of right female breast ([place]) 01/16/2016  . Palpitations 11/13/2014  . Normal coronary arteries 10/17/2014  . Chest pain   . Varicose veins of lower extremities with other complications 31/28/1188  . Hx of herpes simplex infection 12/18/2011  . COMPRESSION FRACTURE, THORACIC VERTEBRA 07/24/2010  . BACK PAIN, LUMBAR 06/28/2010  . NECK AND BACK PAIN 01/18/2010  . OSTEOARTHROSIS UNSPEC WHETHER GEN/LOC LOWER LEG 09/05/2009  . [place] REGION 02/02/2009  . GENERALIZED ANXIETY DISORDER 10/13/2008  . IRRITABLE BOWEL SYNDROME 10/13/2008  . HERPES SIMPLEX INFECTION 08/24/2008  . LUNG NODULE 11/25/2007  . Raynaud's syndrome 07/01/2007  . Aneurysm of thoracic aorta ([place]) 05/06/2007  . Allergic rhinitis 05/06/2007  . Esophageal reflux 05/06/2007  . DEGENERATIVE DISC DISEASE, CERVICAL SPINE 05/06/2007  . Osteoporosis 03/18/2007    Jeanie Sewer PTA 02/19/2017, 12:34 PM  [facility] [address], [place], [place], [address] Phone: [phone]   Fax:  [phone]  Name: Tiffani Kadow MRN: [id number] Date of Birth: 1934/07/31

## 2017-02-19 NOTE — Patient Instructions (Signed)

## 2017-02-23 ENCOUNTER — Encounter: Payer: Self-pay | Admitting: Physical Therapy

## 2017-02-23 ENCOUNTER — Ambulatory Visit: Payer: Medicare Other | Attending: Family Medicine | Admitting: Physical Therapy

## 2017-02-23 DIAGNOSIS — M545 Low back pain, unspecified: Secondary | ICD-10-CM

## 2017-02-23 DIAGNOSIS — R293 Abnormal posture: Secondary | ICD-10-CM | POA: Diagnosis present

## 2017-02-23 NOTE — Therapy (Signed)
[facility] [address] [place], [place], [address] Phone: [phone]   Fax:  [phone]  Physical Therapy Treatment  Patient Details  Name: Cindy Schwartz MRN: [id number] Date of Birth: 1934-08-28 Referring Provider: Carolann Littler, MD  Encounter Date: 02/23/2017    Past Medical History:  Diagnosis Date  . Aneurysm of ascending aorta ([place])    a. 4.2-4.3cm in 2014.  . Cancer ([place]) 1980   ovarian cancer  . Elevated BP Transient 09/2014  . GERD (gastroesophageal reflux disease)   . H/O bone density study    2015  . H/O colonoscopy   . History of ovarian cancer    November 1979  . Insomnia   . Normal coronary arteries Cath - 09/2014  . Osteoporosis   . Raynaud's disease     Past Surgical History:  Procedure Laterality Date  . ABDOMINAL HYSTERECTOMY    . APPENDECTOMY    . BILATERAL SALPINGOOPHORECTOMY    . LEFT HEART CATHETERIZATION WITH CORONARY ANGIOGRAM N/A 10/17/2014   Procedure: LEFT HEART CATHETERIZATION WITH CORONARY ANGIOGRAM;  Surgeon: [name], MD;  Location: [facility] CATH LAB;  Service: Cardiovascular;  Laterality: N/A;  . ovarian ca s/p taj/bso  1979    There were no vitals filed for this visit.          [facility] PT Assessment - 02/23/17 0001      Assessment   Medical Diagnosis strain of lumbar region   Referring Provider Carolann Littler, MD   Onset Date/Surgical Date 01/27/17   Next MD Visit as needed                     [facility] Adult PT Treatment/Exercise - 02/23/17 0001      Lumbar Exercises: Standing   Shoulder Extension AROM;Both;20 reps;Theraband   Theraband Level (Shoulder Extension) Level 2 (Red)   Other Standing Lumbar Exercises Doorway stretch for abdominals  with contralateral hip extension and shoulder flexion     Lumbar Exercises: Supine   Ab Set 10 reps   Glut Set 10 reps  with ball squeeze   Clam 10 reps   Bent Knee Raise 10 reps   Bridge 10 reps;5  seconds   Bridge Limitations single leg bridge holding 3 seconds with verbal cues for technique   Straight Leg Raise 10 reps     Manual Therapy   Manual Therapy Soft tissue mobilization   Manual therapy comments Pt prone with pillow under hips   Soft tissue mobilization to Bil Lumbar paraspinals and Lt SI area                PT Education - 02/23/17 1359    Education provided Yes   Education Details techniques during exercises   Person(s) Educated Patient   Methods Explanation;Verbal cues;Demonstration;Tactile cues   Comprehension Verbalized understanding;Returned demonstration          PT Short Term Goals - 02/19/17 1150      PT SHORT TERM GOAL #1   Title be independent in advanced HEP   Time 4   Period Weeks   Status On-going     PT SHORT TERM GOAL #2   Title sit for 30-45 minutes without limitation   Time 4   Period Weeks   Status On-going     PT SHORT TERM GOAL #3   Title report a 25% reduction in LBP with housework   Time 4   Period Weeks   Status On-going  PT SHORT TERM GOAL #4   Title verbalize and demonstrate correct body mechanics techniques for lumbar protection with housework and ADLs   Time 4   Period Weeks   Status On-going     PT SHORT TERM GOAL #5   Title initiate walking program for exercise and understand safe progression   Time 4   Period Weeks   Status On-going           PT Long Term Goals - 02/23/17 1402      PT LONG TERM GOAL #1   Title be independent in advanced HEP   Time 8   Period Weeks     PT LONG TERM GOAL #2   Title reduce FOTO to < or = to 44% limitation   Time 8   Period Weeks   Status New     PT LONG TERM GOAL #3   Title sit without limitation due to LBP   Time 8   Period Weeks   Status New     PT LONG TERM GOAL #4   Title report a 70% reduction in LBP with housework and ADLs   Time 8   Period Weeks   Status New     PT LONG TERM GOAL #5   Title walk for exercise for 30 minutes without  limitation due to pain   Time 8   Period Weeks   Status New             Patient will benefit from skilled therapeutic intervention in order to improve the following deficits and impairments:     Visit Diagnosis: Acute bilateral low back pain without sciatica  Abnormal posture     Problem List Patient Active Problem List   Diagnosis Date Noted  . Breast cancer of lower-outer quadrant of right female breast ([place]) 01/16/2016  . Palpitations 11/13/2014  . Normal coronary arteries 10/17/2014  . Chest pain   . Varicose veins of lower extremities with other complications 88/41/6606  . Hx of herpes simplex infection 12/18/2011  . COMPRESSION FRACTURE, THORACIC VERTEBRA 07/24/2010  . BACK PAIN, LUMBAR 06/28/2010  . NECK AND BACK PAIN 01/18/2010  . OSTEOARTHROSIS UNSPEC WHETHER GEN/LOC LOWER LEG 09/05/2009  . [place] REGION 02/02/2009  . GENERALIZED ANXIETY DISORDER 10/13/2008  . IRRITABLE BOWEL SYNDROME 10/13/2008  . HERPES SIMPLEX INFECTION 08/24/2008  . LUNG NODULE 11/25/2007  . Raynaud's syndrome 07/01/2007  . Aneurysm of thoracic aorta ([place]) 05/06/2007  . Allergic rhinitis 05/06/2007  . Esophageal reflux 05/06/2007  . DEGENERATIVE DISC DISEASE, CERVICAL SPINE 05/06/2007  . Osteoporosis 03/18/2007    Oretha Caprice, MPT 02/23/2017, 2:41 PM  [facility] [address] [place], [place], [address] Phone: [phone]   Fax:  [phone]  Name: Sabryn Preslar MRN: [id number] Date of Birth: 31-Dec-1933

## 2017-02-26 ENCOUNTER — Ambulatory Visit: Payer: Medicare Other

## 2017-02-26 DIAGNOSIS — M545 Low back pain, unspecified: Secondary | ICD-10-CM

## 2017-02-26 DIAGNOSIS — R293 Abnormal posture: Secondary | ICD-10-CM

## 2017-02-26 NOTE — Patient Instructions (Addendum)
RIB CAGE: Lateral Stretch (Standing)    Place palms together over head. Inhaling, reach hands toward right, exhale and bring back to center. Repeat toward left. Repeat _10__ times, alternating sides. Do _2_ times per day.  Copyright  VHI. All rights reserved.  On Elbows (Prone)    Rise up on elbows as high as possible, keeping hips on floor. Hold __10__ seconds. Repeat _10___ times per set. Do __1__ sets per session. Do __2__ sessions per day.  http://orth.exer.us/93   Copyright  VHI. All rights reserved.  [place] [address], [place], [place] [address] Phone # [phone] Fax [phone]

## 2017-02-26 NOTE — Therapy (Signed)
[facility] [address] [place], [place], [address] Phone: [phone]   Fax:  [phone]  Physical Therapy Treatment  Patient Details  Name: Cindy Schwartz MRN: [id number] Date of Birth: [birth year] Referring Provider: Carolann Littler, MD  Encounter Date: 02/26/2017      PT End of Session - 02/26/17 0840    Visit Number 4   Number of Visits 10   Date for PT Re-Evaluation 04/14/17   PT Start Time 0800   PT Stop Time 0840   PT Time Calculation (min) 40 min   Activity Tolerance Patient tolerated treatment well   Behavior During Therapy [facility] for tasks assessed/performed      Past Medical History:  Diagnosis Date  . Aneurysm of ascending aorta ([place])    a. 4.2-4.3cm in 2014.  . Cancer ([place]) 1980   ovarian cancer  . Elevated BP Transient 09/2014  . GERD (gastroesophageal reflux disease)   . H/O bone density study    2015  . H/O colonoscopy   . History of ovarian cancer    November 1979  . Insomnia   . Normal coronary arteries Cath - 09/2014  . Osteoporosis   . Raynaud's disease     Past Surgical History:  Procedure Laterality Date  . ABDOMINAL HYSTERECTOMY    . APPENDECTOMY    . BILATERAL SALPINGOOPHORECTOMY    . LEFT HEART CATHETERIZATION WITH CORONARY ANGIOGRAM N/A 10/17/2014   Procedure: LEFT HEART CATHETERIZATION WITH CORONARY ANGIOGRAM;  Surgeon: [name], MD;  Location: [facility] CATH LAB;  Service: Cardiovascular;  Laterality: N/A;  . ovarian ca s/p taj/bso  1979    There were no vitals filed for this visit.      Subjective Assessment - 02/26/17 0804    Subjective I am feeling 50% better overall.  I am still having pain in the front abdominal area.  She will see MD today to discuss this pain.     Pertinent History ovarian cancer- no [place], osteoporosis   Patient Stated Goals reduce LBP, sit longer, travel without pain.    Currently in Pain? Yes   Pain Score 4    Pain Location Back   Pain  Orientation Right;Left;Lower   Pain Descriptors / Indicators Aching;Burning                         [facility] Adult PT Treatment/Exercise - 02/26/17 0001      Exercises   Exercises Knee/Hip;Lumbar     Lumbar Exercises: Standing   Other Standing Lumbar Exercises Doorway stretch for abdominals  with contralateral hip extension and shoulder flexion     Lumbar Exercises: Supine   Bridge 10 reps;5 seconds     Lumbar Exercises: Prone   Other Prone Lumbar Exercises prone on elbows and prone press ups x 10 to stretch abdominals     Knee/Hip Exercises: Stretches   Hip Flexor Stretch 3 reps;20 seconds;Both   Hip Flexor Stretch Limitations with overpressure by PT     Manual Therapy   Manual Therapy Soft tissue mobilization   Manual therapy comments orange roller over Lt hip flexor, passive stretch to quadratus with distraction                PT Education - 02/26/17 0828    Education provided Yes   Education Details prone press, quadratus stretch   Person(s) Educated Patient   Methods Explanation;Demonstration;Handout   Comprehension Verbalized understanding;Returned demonstration  PT Short Term Goals - 02/26/17 0805      PT SHORT TERM GOAL #1   Title be independent in initial HEP   Status Achieved     PT SHORT TERM GOAL #2   Title sit for 30-45 minutes without limitation   Baseline 20-30 minutes   Time 4   Period Weeks   Status On-going     PT SHORT TERM GOAL #3   Title report a 25% reduction in LBP with housework   Status Achieved     PT SHORT TERM GOAL #4   Title verbalize and demonstrate correct body mechanics techniques for lumbar protection with housework and ADLs   Status Achieved     PT SHORT TERM GOAL #5   Title initiate walking program for exercise and understand safe progression   Status Achieved           PT Long Term Goals - 02/23/17 1402      PT LONG TERM GOAL #1   Title be independent in advanced HEP   Time 8    Period Weeks     PT LONG TERM GOAL #2   Title reduce FOTO to < or = to 44% limitation   Time 8   Period Weeks   Status New     PT LONG TERM GOAL #3   Title sit without limitation due to LBP   Time 8   Period Weeks   Status New     PT LONG TERM GOAL #4   Title report a 70% reduction in LBP with housework and ADLs   Time 8   Period Weeks   Status New     PT LONG TERM GOAL #5   Title walk for exercise for 30 minutes without limitation due to pain   Time 8   Period Weeks   Status New               Plan - 02/26/17 8242    Clinical Impression Statement Pt reports 50% overall improvement in symptoms since the start of care. Pt continues to report discomfort in the anterior abdominals.  Pt is making body mechanics modifications at home with ADLs and housework.  Pt able to sit for 20-30 minutes and walking is no longer limited.  Pt will continue to benefit from skilled PT for core strength, flexibility, manual and modalities as needed.     Rehab Potential Good   PT Frequency 2x / week   PT Duration 8 weeks   PT Treatment/Interventions ADLs/Self Care Home Management;Cryotherapy;Electrical Stimulation;Functional mobility training;Stair training;Gait training;Ultrasound;Therapeutic activities;Therapeutic exercise;Neuromuscular re-education;Patient/family education;Passive range of motion;Manual techniques;Dry needling;Taping   PT Next Visit Plan quadratus flexibility, core strength, hip strength, modalities and manual as needed. Pt leaving on trip 03/12/17-d/c before   Recommended Other Services initial certification is signed   Consulted and Agree with Plan of Care Patient      Patient will benefit from skilled therapeutic intervention in order to improve the following deficits and impairments:  Decreased activity tolerance, Improper body mechanics, Impaired flexibility, Pain, Decreased endurance, Increased muscle spasms, Decreased range of motion  Visit Diagnosis: Acute  bilateral low back pain without sciatica  Abnormal posture     Problem List Patient Active Problem List   Diagnosis Date Noted  . Breast cancer of lower-outer quadrant of right female breast ([place]) 01/16/2016  . Palpitations 11/13/2014  . Normal coronary arteries 10/17/2014  . Chest pain   . Varicose veins of lower extremities with other complications  03/09/2012  . Hx of herpes simplex infection 12/18/2011  . COMPRESSION FRACTURE, THORACIC VERTEBRA 07/24/2010  . BACK PAIN, LUMBAR 06/28/2010  . NECK AND BACK PAIN 01/18/2010  . OSTEOARTHROSIS UNSPEC WHETHER GEN/LOC LOWER LEG 09/05/2009  . [place] REGION 02/02/2009  . GENERALIZED ANXIETY DISORDER 10/13/2008  . IRRITABLE BOWEL SYNDROME 10/13/2008  . HERPES SIMPLEX INFECTION 08/24/2008  . LUNG NODULE 11/25/2007  . Raynaud's syndrome 07/01/2007  . Aneurysm of thoracic aorta ([place]) 05/06/2007  . Allergic rhinitis 05/06/2007  . Esophageal reflux 05/06/2007  . DEGENERATIVE DISC DISEASE, CERVICAL SPINE 05/06/2007  . Osteoporosis 03/18/2007    Sigurd Sos, PT 02/26/17 8:48 AM  [facility] [address] [place], [place], [address] Phone: [phone]   Fax:  [phone]  Name: Cindy Schwartz MRN: [id number] Date of Birth: 08/03/1934

## 2017-02-27 ENCOUNTER — Ambulatory Visit: Payer: Medicare Other | Admitting: Rehabilitation

## 2017-03-03 ENCOUNTER — Telehealth: Payer: Self-pay | Admitting: Internal Medicine

## 2017-03-03 ENCOUNTER — Ambulatory Visit: Payer: Medicare Other | Admitting: Physical Therapy

## 2017-03-03 DIAGNOSIS — R293 Abnormal posture: Secondary | ICD-10-CM

## 2017-03-03 DIAGNOSIS — M545 Low back pain, unspecified: Secondary | ICD-10-CM

## 2017-03-03 MED ORDER — TRAMADOL HCL 50 MG PO TABS: 50.0000 mg | ORAL_TABLET | Freq: Two times a day (BID) | ORAL | 0 refills | Status: DC | PRN

## 2017-03-03 NOTE — Patient Instructions (Signed)
Flexibility: Upper Trapezius Stretch    Gently grasp right side of head while holding onto edge of chair or bed. Tilt head away until a gentle stretch is felt. Hold __30__ seconds. Repeat __3__ times per set. Do __1__ sets per session. Do __2-3__ sessions per day.  http://orth.exer.us/341   Copyright  VHI. All rights reserved.

## 2017-03-03 NOTE — Telephone Encounter (Signed)
Tramadol 50 mg #30 one twice daily as needed for pain

## 2017-03-03 NOTE — Telephone Encounter (Signed)
The patient has been doing physical therapy and stopped by today to ask if Doctor Burnice Logan can call her in some pain medication or muscle relaxer to: [facility], [place] [address] [phone] (Phone) [phone] (Fax)   She was needing the medication before she leaves the country. Please advise

## 2017-03-03 NOTE — Telephone Encounter (Signed)
Medication was called in to pt's pharmacy. Pt made aware.

## 2017-03-03 NOTE — Telephone Encounter (Signed)
Please see message below, please advise 

## 2017-03-03 NOTE — Therapy (Signed)
[facility] [address] [place], [place], [address] Phone: [phone]   Fax:  [phone]  Physical Therapy Treatment  Patient Details  Name: Cindy Schwartz MRN: [id number] Date of Birth: May 31, 1934 Referring Provider: Carolann Littler, MD  Encounter Date: 03/03/2017      PT End of Session - 03/03/17 0843    Visit Number 5   Number of Visits 10   Date for PT Re-Evaluation 04/14/17   PT Start Time 0800   PT Stop Time 0852   PT Time Calculation (min) 52 min   Activity Tolerance Patient tolerated treatment well   Behavior During Therapy [facility] for tasks assessed/performed      Past Medical History:  Diagnosis Date  . Aneurysm of ascending aorta ([place])    a. 4.2-4.3cm in 2014.  . Cancer ([place]) 1980   ovarian cancer  . Elevated BP Transient 09/2014  . GERD (gastroesophageal reflux disease)   . H/O bone density study    2015  . H/O colonoscopy   . History of ovarian cancer    November 1979  . Insomnia   . Normal coronary arteries Cath - 09/2014  . Osteoporosis   . Raynaud's disease     Past Surgical History:  Procedure Laterality Date  . ABDOMINAL HYSTERECTOMY    . APPENDECTOMY    . BILATERAL SALPINGOOPHORECTOMY    . LEFT HEART CATHETERIZATION WITH CORONARY ANGIOGRAM N/A 10/17/2014   Procedure: LEFT HEART CATHETERIZATION WITH CORONARY ANGIOGRAM;  Surgeon: [name], MD;  Location: [facility] CATH LAB;  Service: Cardiovascular;  Laterality: N/A;  . ovarian ca s/p taj/bso  1979    There were no vitals filed for this visit.      Subjective Assessment - 03/03/17 0804    Subjective Left shoulder is hurting today; she thinks it is from the doorway abdominal stretch.   Pertinent History ovarian cancer- no [place], osteoporosis   Patient Stated Goals reduce LBP, sit longer, travel without pain.    Currently in Pain? Yes   Pain Score 4    Pain Location Back   Pain Orientation Lower;Right;Left   Pain Descriptors /  Indicators Aching;Burning   Pain Type Acute pain   Pain Onset 1 to 4 weeks ago   Pain Frequency Intermittent   Aggravating Factors  sitting and standing too long, movement   Pain Relieving Factors in bed   Multiple Pain Sites Yes   Pain Score 7   Pain Location Shoulder   Pain Orientation Left   Pain Descriptors / Indicators Heaviness   Pain Type Acute pain   Pain Onset Yesterday   Pain Frequency Constant   Aggravating Factors  trying to lie down to sleep   Pain Relieving Factors unsure                         [facility] Adult PT Treatment/Exercise - 03/03/17 0806      Self-Care   Self-Care Other Self-Care Comments   Other Self-Care Comments  reviewed doorway stretch HEP; recommended pt hold off on stretch at this time due to increased pain in Lt shoulder/UT     Exercises   Exercises Neck     Lumbar Exercises: Stretches   Single Knee to Chest Stretch 3 reps;30 seconds   Single Knee to Chest Stretch Limitations bil   Lower Trunk Rotation 3 reps;30 seconds   Lower Trunk Rotation Limitations bil     Lumbar Exercises: Aerobic   Stationary  Bike NuStep L3 x 6 min     Modalities   Modalities Moist Heat     Moist Heat Therapy   Number Minutes Moist Heat 12 Minutes   Moist Heat Location Cervical;Shoulder  Lt side     Manual Therapy   Manual Therapy Soft tissue mobilization   Soft tissue mobilization Lt UT     Neck Exercises: Stretches   Upper Trapezius Stretch 3 reps;30 seconds   Upper Trapezius Stretch Limitations Lt                PT Education - 03/03/17 0842    Education provided Yes   Education Details UT stretch, hold on quadratus stretch for now   Person(s) Educated Patient   Methods Explanation;Demonstration;Handout   Comprehension Verbalized understanding;Returned demonstration;Need further instruction          PT Short Term Goals - 02/26/17 0805      PT SHORT TERM GOAL #1   Title be independent in initial HEP   Status Achieved      PT SHORT TERM GOAL #2   Title sit for 30-45 minutes without limitation   Baseline 20-30 minutes   Time 4   Period Weeks   Status On-going     PT SHORT TERM GOAL #3   Title report a 25% reduction in LBP with housework   Status Achieved     PT SHORT TERM GOAL #4   Title verbalize and demonstrate correct body mechanics techniques for lumbar protection with housework and ADLs   Status Achieved     PT SHORT TERM GOAL #5   Title initiate walking program for exercise and understand safe progression   Status Achieved           PT Long Term Goals - 02/23/17 1402      PT LONG TERM GOAL #1   Title be independent in advanced HEP   Time 8   Period Weeks     PT LONG TERM GOAL #2   Title reduce FOTO to < or = to 44% limitation   Time 8   Period Weeks   Status New     PT LONG TERM GOAL #3   Title sit without limitation due to LBP   Time 8   Period Weeks   Status New     PT LONG TERM GOAL #4   Title report a 70% reduction in LBP with housework and ADLs   Time 8   Period Weeks   Status New     PT LONG TERM GOAL #5   Title walk for exercise for 30 minutes without limitation due to pain   Time 8   Period Weeks   Status New               Plan - 03/03/17 7353    Clinical Impression Statement Pt today reports increased pain in Lt UT following quadratus stretch in doorway.  Recommended pt hold off on stretch for now; and sesion focused on other options to stretch quadratus as well as pain relief for UT.  Will continue to benefit from PT to decrease pain.   PT Treatment/Interventions ADLs/Self Care Home Management;Cryotherapy;Electrical Stimulation;Functional mobility training;Stair training;Gait training;Ultrasound;Therapeutic activities;Therapeutic exercise;Neuromuscular re-education;Patient/family education;Passive range of motion;Manual techniques;Dry needling;Taping   PT Next Visit Plan quadratus flexibility, core strength, hip strength, modalities and manual as  needed. Pt leaving on trip 03/12/17-d/c before   Consulted and Agree with Plan of Care Patient      Patient will  benefit from skilled therapeutic intervention in order to improve the following deficits and impairments:  Decreased activity tolerance, Improper body mechanics, Impaired flexibility, Pain, Decreased endurance, Increased muscle spasms, Decreased range of motion  Visit Diagnosis: Acute bilateral low back pain without sciatica  Abnormal posture     Problem List Patient Active Problem List   Diagnosis Date Noted  . Breast cancer of lower-outer quadrant of right female breast ([place]) 01/16/2016  . Palpitations 11/13/2014  . Normal coronary arteries 10/17/2014  . Chest pain   . Varicose veins of lower extremities with other complications 18/33/5825  . Hx of herpes simplex infection 12/18/2011  . COMPRESSION FRACTURE, THORACIC VERTEBRA 07/24/2010  . BACK PAIN, LUMBAR 06/28/2010  . NECK AND BACK PAIN 01/18/2010  . OSTEOARTHROSIS UNSPEC WHETHER GEN/LOC LOWER LEG 09/05/2009  . [place] REGION 02/02/2009  . GENERALIZED ANXIETY DISORDER 10/13/2008  . IRRITABLE BOWEL SYNDROME 10/13/2008  . HERPES SIMPLEX INFECTION 08/24/2008  . LUNG NODULE 11/25/2007  . Raynaud's syndrome 07/01/2007  . Aneurysm of thoracic aorta ([place]) 05/06/2007  . Allergic rhinitis 05/06/2007  . Esophageal reflux 05/06/2007  . DEGENERATIVE DISC DISEASE, CERVICAL SPINE 05/06/2007  . Osteoporosis 03/18/2007      Laureen Abrahams, PT, DPT 03/03/17 8:45 AM    [facility] [address] [place], [place], [address] Phone: [phone]   Fax:  [phone]  Name: Cindy Schwartz MRN: [id number] Date of Birth: Apr 05, 1934

## 2017-03-06 ENCOUNTER — Ambulatory Visit: Payer: Medicare Other | Admitting: Physical Therapy

## 2017-03-06 DIAGNOSIS — M545 Low back pain, unspecified: Secondary | ICD-10-CM

## 2017-03-06 DIAGNOSIS — R293 Abnormal posture: Secondary | ICD-10-CM

## 2017-03-06 NOTE — Therapy (Signed)
[facility] [address] [place], [place], [address] Phone: [phone]   Fax:  [phone]  Physical Therapy Treatment  Patient Details  Name: Cindy Schwartz MRN: [id number] Date of Birth: 16-May-1934 Referring Provider: Carolann Littler, MD  Encounter Date: 03/06/2017      PT End of Session - 03/06/17 0941    Visit Number 6   Number of Visits 10   Date for PT Re-Evaluation 04/14/17   PT Start Time 0853   PT Stop Time 0951   PT Time Calculation (min) 58 min   Activity Tolerance Patient tolerated treatment well   Behavior During Therapy [facility] for tasks assessed/performed      Past Medical History:  Diagnosis Date  . Aneurysm of ascending aorta ([place])    a. 4.2-4.3cm in 2014.  . Cancer ([place]) 1980   ovarian cancer  . Elevated BP Transient 09/2014  . GERD (gastroesophageal reflux disease)   . H/O bone density study    2015  . H/O colonoscopy   . History of ovarian cancer    November 1979  . Insomnia   . Normal coronary arteries Cath - 09/2014  . Osteoporosis   . Raynaud's disease     Past Surgical History:  Procedure Laterality Date  . ABDOMINAL HYSTERECTOMY    . APPENDECTOMY    . BILATERAL SALPINGOOPHORECTOMY    . LEFT HEART CATHETERIZATION WITH CORONARY ANGIOGRAM N/A 10/17/2014   Procedure: LEFT HEART CATHETERIZATION WITH CORONARY ANGIOGRAM;  Surgeon: [name], MD;  Location: [facility] C[facility] LAB;  Service: Cardiovascular;  Laterality: N/A;  . ovarian ca s/p taj/bso  1979    There were no vitals filed for this visit.      Subjective Assessment - 03/06/17 0856    Subjective Lt shoulder feels much better after last session.  Going to see PCP because she is concerned she's losing weight; just found out that breast cancer has returned.  Will be having surgery (lumpectomy v/s mastectomy) soon so she's not going to [place] anymore.   Pertinent History ovarian cancer- no [place], osteoporosis   Patient Stated Goals  reduce LBP, sit longer, travel without pain.    Currently in Pain? Yes   Pain Score 3    Pain Location Back   Pain Orientation Lower;Mid   Pain Descriptors / Indicators Discomfort   Pain Type Acute pain   Pain Onset 1 to 4 weeks ago   Pain Frequency Intermittent   Aggravating Factors  sitting and standing too long, movement   Pain Relieving Factors in bed                         [facility] Adult PT Treatment/Exercise - 03/06/17 0858      Lumbar Exercises: Stretches   Quadruped Mid Back Stretch 5 reps;10 seconds   Quadruped Mid Back Stretch Limitations seated with ball; mid/Lt/Rt     Lumbar Exercises: Aerobic   Stationary Bike NuStep L3 x 6 min     Lumbar Exercises: Supine   Ab Set 10 reps;5 seconds   Clam 10 reps   Clam Limitations bil; with ab set   Bridge 10 reps;5 seconds     Moist Heat Therapy   Number Minutes Moist Heat 12 Minutes   Moist Heat Location Cervical;Shoulder;Lumbar Spine     Manual Therapy   Manual Therapy Soft tissue mobilization   Soft tissue mobilization Lt UT, and Lt glutes  PT Short Term Goals - 02/26/17 0805      PT SHORT TERM GOAL #1   Title be independent in initial HEP   Status Achieved     PT SHORT TERM GOAL #2   Title sit for 30-45 minutes without limitation   Baseline 20-30 minutes   Time 4   Period Weeks   Status On-going     PT SHORT TERM GOAL #3   Title report a 25% reduction in LBP with housework   Status Achieved     PT SHORT TERM GOAL #4   Title verbalize and demonstrate correct body mechanics techniques for lumbar protection with housework and ADLs   Status Achieved     PT SHORT TERM GOAL #5   Title initiate walking program for exercise and understand safe progression   Status Achieved           PT Long Term Goals - 02/23/17 1402      PT LONG TERM GOAL #1   Title be independent in advanced HEP   Time 8   Period Weeks     PT LONG TERM GOAL #2   Title reduce FOTO to < or  = to 44% limitation   Time 8   Period Weeks   Status New     PT LONG TERM GOAL #3   Title sit without limitation due to LBP   Time 8   Period Weeks   Status New     PT LONG TERM GOAL #4   Title report a 70% reduction in LBP with housework and ADLs   Time 8   Period Weeks   Status New     PT LONG TERM GOAL #5   Title walk for exercise for 30 minutes without limitation due to pain   Time 8   Period Weeks   Status New               Plan - 03/06/17 0946    Clinical Impression Statement Pt reports pain in Lt upper trap is significantly improved but still has some mild pain.  She also reports back pain is improved but still has some "discomfort."  Initial plan to d/c next week as she was planning to go to [place] for 6 weeks, but this has now been canceled due to recurrence of breast cancer and need for surgery.  Will continue to benefit from PT to maximize function, but may need to d/c next week due to medical changes.   PT Treatment/Interventions ADLs/Self Care Home Management;Cryotherapy;Electrical Stimulation;Functional mobility training;Stair training;Gait training;Ultrasound;Therapeutic activities;Therapeutic exercise;Neuromuscular re-education;Patient/family education;Passive range of motion;Manual techniques;Dry needling;Taping   PT Next Visit Plan quadratus flexibility, core strength, hip strength, modalities and manual as needed. Begin assessing LTGs and plan to renew or d/c.   Consulted and Agree with Plan of Care Patient      Patient will benefit from skilled therapeutic intervention in order to improve the following deficits and impairments:  Decreased activity tolerance, Improper body mechanics, Impaired flexibility, Pain, Decreased endurance, Increased muscle spasms, Decreased range of motion  Visit Diagnosis: Acute bilateral low back pain without sciatica  Abnormal posture     Problem List Patient Active Problem List   Diagnosis Date Noted  . Breast  cancer of lower-outer quadrant of right female breast ([place]) 01/16/2016  . Palpitations 11/13/2014  . Normal coronary arteries 10/17/2014  . Chest pain   . Varicose veins of lower extremities with other complications 82/99/3716  . Hx of herpes simplex infection  12/18/2011  . COMPRESSION FRACTURE, THORACIC VERTEBRA 07/24/2010  . BACK PAIN, LUMBAR 06/28/2010  . NECK AND BACK PAIN 01/18/2010  . OSTEOARTHROSIS UNSPEC WHETHER GEN/LOC LOWER LEG 09/05/2009  . [place] REGION 02/02/2009  . GENERALIZED ANXIETY DISORDER 10/13/2008  . IRRITABLE BOWEL SYNDROME 10/13/2008  . HERPES SIMPLEX INFECTION 08/24/2008  . LUNG NODULE 11/25/2007  . Raynaud's syndrome 07/01/2007  . Aneurysm of thoracic aorta ([place]) 05/06/2007  . Allergic rhinitis 05/06/2007  . Esophageal reflux 05/06/2007  . DEGENERATIVE DISC DISEASE, CERVICAL SPINE 05/06/2007  . Osteoporosis 03/18/2007      Laureen Abrahams, PT, DPT 03/06/17 9:54 AM    [facility] [address] [place], [place], [address] Phone: [phone]   Fax:  [phone]  Name: Cindy Schwartz MRN: [id number] Date of Birth: Jan 05, 1934

## 2017-03-09 ENCOUNTER — Encounter: Payer: Self-pay | Admitting: Internal Medicine

## 2017-03-09 ENCOUNTER — Ambulatory Visit (INDEPENDENT_AMBULATORY_CARE_PROVIDER_SITE_OTHER): Payer: Medicare Other | Admitting: Internal Medicine

## 2017-03-09 VITALS — BP 118/72 | HR 73 | Temp 97.9°F | Ht 68.0 in | Wt 140.8 lb

## 2017-03-09 DIAGNOSIS — R1030 Lower abdominal pain, unspecified: Secondary | ICD-10-CM | POA: Diagnosis not present

## 2017-03-09 DIAGNOSIS — G8929 Other chronic pain: Secondary | ICD-10-CM | POA: Diagnosis not present

## 2017-03-09 DIAGNOSIS — C50511 Malignant neoplasm of lower-outer quadrant of right female breast: Secondary | ICD-10-CM | POA: Diagnosis not present

## 2017-03-09 DIAGNOSIS — R634 Abnormal weight loss: Secondary | ICD-10-CM

## 2017-03-09 DIAGNOSIS — M545 Low back pain, unspecified: Secondary | ICD-10-CM

## 2017-03-09 DIAGNOSIS — R531 Weakness: Secondary | ICD-10-CM

## 2017-03-09 LAB — CBC WITH DIFFERENTIAL/PLATELET
BASOS ABS: 0 10*3/uL (ref 0.0–0.1)
Basophils Relative: 0.8 % (ref 0.0–3.0)
Eosinophils Absolute: 0.2 10*3/uL (ref 0.0–0.7)
Eosinophils Relative: 2.8 % (ref 0.0–5.0)
HCT: 40.3 % (ref 36.0–46.0)
Hemoglobin: 13.3 g/dL (ref 12.0–15.0)
LYMPHS ABS: 1.9 10*3/uL (ref 0.7–4.0)
Lymphocytes Relative: 29.6 % (ref 12.0–46.0)
MCHC: 32.9 g/dL (ref 30.0–36.0)
MCV: 85 fl (ref 78.0–100.0)
MONO ABS: 0.7 10*3/uL (ref 0.1–1.0)
Monocytes Relative: 10.2 % (ref 3.0–12.0)
NEUTROS ABS: 3.7 10*3/uL (ref 1.4–7.7)
NEUTROS PCT: 56.6 % (ref 43.0–77.0)
PLATELETS: 287 10*3/uL (ref 150.0–400.0)
RBC: 4.75 Mil/uL (ref 3.87–5.11)
RDW: 14 % (ref 11.5–15.5)
WBC: 6.5 10*3/uL (ref 4.0–10.5)

## 2017-03-09 LAB — COMPREHENSIVE METABOLIC PANEL
ALBUMIN: 4.4 g/dL (ref 3.5–5.2)
ALK PHOS: 77 U/L (ref 39–117)
ALT: 13 U/L (ref 0–35)
AST: 18 U/L (ref 0–37)
BILIRUBIN TOTAL: 0.5 mg/dL (ref 0.2–1.2)
BUN: 15 mg/dL (ref 6–23)
CO2: 30 mEq/L (ref 19–32)
CREATININE: 0.64 mg/dL (ref 0.40–1.20)
Calcium: 10.3 mg/dL (ref 8.4–10.5)
Chloride: 99 mEq/L (ref 96–112)
GFR: 94.6 mL/min (ref >60.00)
GLUCOSE: 93 mg/dL (ref 70–99)
POTASSIUM: 4.8 meq/L (ref 3.5–5.1)
SODIUM: 135 meq/L (ref 135–145)
TOTAL PROTEIN: 7.3 g/dL (ref 6.0–8.3)

## 2017-03-09 LAB — SEDIMENTATION RATE: SED RATE: 7 mm/h (ref 0–30)

## 2017-03-09 LAB — VITAMIN B12: Vitamin B-12: >1500 pg/mL — ABNORMAL HIGH (ref 211–911)

## 2017-03-09 LAB — TSH: TSH: 0.95 u[IU]/mL (ref 0.35–4.50)

## 2017-03-09 NOTE — Patient Instructions (Addendum)
WE NOW OFFER   [facility] FAST TRACK!!!  SAME DAY Appointments for ACUTE CARE  Such as: Sprains, Injuries, cuts, abrasions, rashes, muscle pain, joint pain, back pain Colds, flu, sore throats, headache, allergies, cough, fever  Ear pain, sinus and eye infections Abdominal pain, nausea, vomiting, diarrhea, upset stomach Animal/insect bites  3 Easy Ways to Schedule: Walk-In Scheduling Call in scheduling Mychart Sign-up: [email or web address]  General surgical follow-up as discussed Return here in one month Report any new or worsening symptoms Abdominal/pelvic CT scan

## 2017-03-09 NOTE — Progress Notes (Signed)
Subjective:    Patient ID: Cindy Schwartz, female    DOB: 10-10-1933, 83 y.o.   MRN: [id number]  HPI 83 year old patient who is seen today with a two-month history of increasing weakness, weight loss and lower abdominal pain. She was seen here on May 25 with low back pain thought secondary to a lumbar strain.  This has improved nicely after physical therapy.  In general, she Feels poorly with the above symptoms. She has a history of a partial right mastectomy for breast cancer in [place] in June 2017.  She had follow-up breast biopsy approximately 10 days ago that revealed recurrent disease. She states that she has maintained a normal appetite.  Does admit to some situational stress.  No recent colonoscopies She is concerned about B12 deficiency due to chronicNexium use  Wt Readings from Last 3 Encounters:  03/09/17 140 lb 12.8 oz (63.9 kg)  02/13/17 144 lb 1.6 oz (65.4 kg)  01/14/17 144 lb (65.3 kg)    Past Medical History:  Diagnosis Date  . Aneurysm of ascending aorta ([place])    a. 4.2-4.3cm in 2014.  . Cancer ([place]) 1980   ovarian cancer  . Elevated BP Transient 09/2014  . GERD (gastroesophageal reflux disease)   . H/O bone density study    2015  . H/O colonoscopy   . History of ovarian cancer    November 1979  . Insomnia   . Normal coronary arteries Cath - 09/2014  . Osteoporosis   . Raynaud's disease      Social History   Social History  . Marital status: Married    Spouse name: Publishing copy  . Number of children: 2  . Years of education: N/A   Occupational History  . retired Retired   Social History Main Topics  . Smoking status: Never Smoker  . Smokeless tobacco: Never Used  . Alcohol use No  . Drug use: No  . Sexual activity: Not on file   Other Topics Concern  . Not on file   Social History Narrative   She and her husband are from the [place] of [place].`   Children: Cindy Schwartz age 71, lives in [place], [place], age 6, lives  in [place], [place]    Past Surgical History:  Procedure Laterality Date  . ABDOMINAL HYSTERECTOMY    . APPENDECTOMY    . BILATERAL SALPINGOOPHORECTOMY    . LEFT HEART CATHETERIZATION WITH CORONARY ANGIOGRAM N/A 10/17/2014   Procedure: LEFT HEART CATHETERIZATION WITH CORONARY ANGIOGRAM;  Surgeon: [name], MD;  [facility] C[facility] LAB;  Service: Cardiovascular;  Laterality: N/A;  . ovarian ca s/p taj/bso  1979    Family History  Problem Relation Age of Onset  . Healthy Mother   . Healthy Brother     Allergies  Allergen Reactions  . Chlorhexidine Gluconate Rash    Current Outpatient Prescriptions on File Prior to Visit  Medication Sig Dispense Refill  . cetirizine (ZYRTEC) 10 MG tablet Take 10 mg by mouth daily as needed for allergies.     Marland Kitchen NEXIUM 40 MG capsule TAKE 1 CAPSULE DAILY 90 capsule 3  . traMADol (ULTRAM) 50 MG tablet Take 1 tablet (50 mg total) by mouth 2 (two) times daily as needed. 30 tablet 0  . valACYclovir (VALTREX) 500 MG tablet Take 1 tablet (500 mg total) by mouth once a week. 12 tablet 3   No current facility-administered medications on file prior to visit.  BP 118/72 (BP Location: Left Arm, Patient Position: Sitting, Cuff Size: Normal)   Pulse 73   Temp 97.9 F (36.6 C) (Oral)   Ht 5\' 8"  (1.727 m)   Wt 140 lb 12.8 oz (63.9 kg)   SpO2 98%   BMI 21.41 kg/m      Review of Systems  Constitutional: Positive for activity change, fatigue and unexpected weight change.  HENT: Negative for congestion, dental problem, hearing loss, rhinorrhea, sinus pressure, sore throat and tinnitus.   Eyes: Negative for pain, discharge and visual disturbance.  Respiratory: Negative for cough and shortness of breath.   Cardiovascular: Negative for chest pain, palpitations and leg swelling.  Gastrointestinal: Positive for abdominal pain. Negative for abdominal distention, blood in stool, constipation, diarrhea, nausea and vomiting.  Genitourinary: Negative for  difficulty urinating, dysuria, flank pain, frequency, hematuria, pelvic pain, urgency, vaginal bleeding, vaginal discharge and vaginal pain.  Musculoskeletal: Negative for arthralgias, gait problem and joint swelling.  Skin: Negative for rash.  Neurological: Positive for weakness. Negative for dizziness, syncope, speech difficulty, numbness and headaches.  Hematological: Negative for adenopathy.  Psychiatric/Behavioral: Negative for agitation, behavioral problems and dysphoric mood. The patient is not nervous/anxious.        Objective:   Physical Exam  Constitutional: She is oriented to person, place, and time. She appears well-developed and well-nourished.  HENT:  Head: Normocephalic.  Right Ear: External ear normal.  Left Ear: External ear normal.  Mouth/Throat: Oropharynx is clear and moist.  Eyes: Conjunctivae and EOM are normal. Pupils are equal, round, and reactive to light.  Neck: Normal range of motion. Neck supple. No thyromegaly present.  Cardiovascular: Normal rate, regular rhythm, normal heart sounds and intact distal pulses.   Pulmonary/Chest: Effort normal and breath sounds normal.  Status post partial mastectomy, right lateral breast Axilla clear  Abdominal: Soft. Bowel sounds are normal. She exhibits no mass. There is no tenderness.  Benign Lower midline and right lower quadrant surgical scars   Musculoskeletal: Normal range of motion.  Lymphadenopathy:    She has no cervical adenopathy.  Neurological: She is alert and oriented to person, place, and time.  Skin: Skin is warm and dry. No rash noted.  Psychiatric: She has a normal mood and affect. Her behavior is normal.          Assessment & Plan:   Recurrent right breast cancer Two-month history of weakness, weight loss and abdominal discomfort.  We'll check screening lab and set up for abdominal/pelvic CT scan.  Consider colonoscopy Low back pain, improved  Nyoka Cowden

## 2017-03-10 ENCOUNTER — Ambulatory Visit: Payer: Medicare Other

## 2017-03-10 DIAGNOSIS — M545 Low back pain, unspecified: Secondary | ICD-10-CM

## 2017-03-10 DIAGNOSIS — R293 Abnormal posture: Secondary | ICD-10-CM

## 2017-03-10 NOTE — Therapy (Addendum)
[facility] [address], [place], [place], [address] Phone: [phone]   Fax:  [phone]  Physical Therapy Treatment  Patient Details  Name: Cindy Schwartz MRN: [id number] Date of Birth: 04/18/1934 Referring Provider: Carolann Littler, MD  Encounter Date: 03/10/2017      PT End of Session - 03/10/17 0931    Visit Number 7   Number of Visits 10   Date for PT Re-Evaluation 04/14/17   PT Start Time 0848   PT Stop Time 0931   PT Time Calculation (min) 43 min   Activity Tolerance Patient tolerated treatment well   Behavior During Therapy [facility] for tasks assessed/performed      Past Medical History:  Diagnosis Date  . Aneurysm of ascending aorta ([place])    a. 4.2-4.3cm in 2014.  . Cancer ([place]) 1980   ovarian cancer  . Elevated BP Transient 09/2014  . GERD (gastroesophageal reflux disease)   . H/O bone density study    2015  . H/O colonoscopy   . History of ovarian cancer    November 1979  . Insomnia   . Normal coronary arteries Cath - 09/2014  . Osteoporosis   . Raynaud's disease     Past Surgical History:  Procedure Laterality Date  . ABDOMINAL HYSTERECTOMY    . APPENDECTOMY    . BILATERAL SALPINGOOPHORECTOMY    . LEFT HEART CATHETERIZATION WITH CORONARY ANGIOGRAM N/A 10/17/2014   Procedure: LEFT HEART CATHETERIZATION WITH CORONARY ANGIOGRAM;  Surgeon: [name], MD;  Location: [facility] CATH LAB;  Service: Cardiovascular;  Laterality: N/A;  . ovarian ca s/p taj/bso  1979    There were no vitals filed for this visit.      Subjective Assessment - 03/10/17 0850    Subjective Going tomorrrow for CT scan of abdomen due to continued pain.  LBP feels 85% improved since the start of care.  Still trying to decide what to do regarding breast cancer.   Pertinent History ovarian cancer- no [place], osteoporosis, breast cancer.     Patient Stated Goals reduce LBP, sit longer, travel without pain.    Currently in Pain? Yes    Pain Score 3    Pain Location Back   Pain Orientation Lower;Right;Left   Pain Descriptors / Indicators Discomfort   Pain Type Acute pain   Pain Onset 1 to 4 weeks ago   Pain Frequency Intermittent   Aggravating Factors  sitting or standing too long, movement   Pain Relieving Factors supine, stretching                         [facility] Adult PT Treatment/Exercise - 03/10/17 0001      Lumbar Exercises: Stretches   Single Knee to Chest Stretch 3 reps;30 seconds   Single Knee to Chest Stretch Limitations bil     Lumbar Exercises: Aerobic   Stationary Bike NuStep L3 x 8 min  PT present to discuss progress     Lumbar Exercises: Standing   Row Strengthening;Both;20 reps   Shoulder Extension Strengthening;Both;20 reps;Theraband   Theraband Level (Shoulder Extension) Level 1 (Yellow)     Lumbar Exercises: Supine   Clam 20 reps   Clam Limitations bil; with ab set   Bridge 10 reps;5 seconds     Knee/Hip Exercises: Standing   Hip Abduction Stengthening;Both;2 sets;10 reps   Hip Extension Stengthening;Both;2 sets;10 reps  PT Education - 03/10/17 0900    Education provided Yes   Education Details scapular attached   Person(s) Educated Patient   Methods Explanation;Demonstration;Handout   Comprehension Verbalized understanding;Returned demonstration          PT Short Term Goals - 03/10/17 0852      PT SHORT TERM GOAL #1   Title be independent in initial HEP   Status Achieved     PT [place] #2   Title sit for 30-45 minutes without limitation   Baseline 20-30 minutes   Time 4   Period Weeks   Status On-going     PT SHORT TERM GOAL #3   Title report a 25% reduction in LBP with housework   Status Achieved     PT SHORT TERM GOAL #4   Title verbalize and demonstrate correct body mechanics techniques for lumbar protection with housework and ADLs   Status Achieved     PT SHORT TERM GOAL #5   Title initiate walking program  for exercise and understand safe progression   Status Achieved           PT Long Term Goals - 02/23/17 1402      PT LONG TERM GOAL #1   Title be independent in advanced HEP   Time 8   Period Weeks     PT LONG TERM GOAL #2   Title reduce FOTO to < or = to 44% limitation   Time 8   Period Weeks   Status New     PT LONG TERM GOAL #3   Title sit without limitation due to LBP   Time 8   Period Weeks   Status New     PT LONG TERM GOAL #4   Title report a 70% reduction in LBP with housework and ADLs   Time 8   Period Weeks   Status New     PT LONG TERM GOAL #5   Title walk for exercise for 30 minutes without limitation due to pain   Time 8   Period Weeks   Status New               Plan - 03/10/17 8527    Clinical Impression Statement Pt reports 85% reduction in LBP since the start of care.  Pt continues to have abdominal pain and will have CT scan tomorrow to investigate this pain.  Pt with return of breast cancer and will decide on her course of treatment.  Pt tolerated all exercise at home without increased pain today.  Pt will continue to benefit from skilled PT for core strength. endurance, flexibility and pain management as needed.     Rehab Potential Good   PT Frequency 2x / week   PT Duration 8 weeks   PT Next Visit Plan quadratus flexibility, core strength, hip strength, modalities and manual as needed.    Consulted and Agree with Plan of Care Patient      Patient will benefit from skilled therapeutic intervention in order to improve the following deficits and impairments:  Decreased activity tolerance, Improper body mechanics, Impaired flexibility, Pain, Decreased endurance, Increased muscle spasms, Decreased range of motion  Visit Diagnosis: Acute bilateral low back pain without sciatica  Abnormal posture     Problem List Patient Active Problem List   Diagnosis Date Noted  . Breast cancer of lower-outer quadrant of right female breast ([place])  01/16/2016  . Palpitations 11/13/2014  . Normal coronary arteries 10/17/2014  . Chest  pain   . Varicose veins of lower extremities with other complications 99/02/8933  . Hx of herpes simplex infection 12/18/2011  . COMPRESSION FRACTURE, THORACIC VERTEBRA 07/24/2010  . BACK PAIN, LUMBAR 06/28/2010  . NECK AND BACK PAIN 01/18/2010  . OSTEOARTHROSIS UNSPEC WHETHER GEN/LOC LOWER LEG 09/05/2009  . [place] REGION 02/02/2009  . GENERALIZED ANXIETY DISORDER 10/13/2008  . IRRITABLE BOWEL SYNDROME 10/13/2008  . HERPES SIMPLEX INFECTION 08/24/2008  . LUNG NODULE 11/25/2007  . Raynaud's syndrome 07/01/2007  . Aneurysm of thoracic aorta ([place]) 05/06/2007  . Allergic rhinitis 05/06/2007  . Esophageal reflux 05/06/2007  . DEGENERATIVE DISC DISEASE, CERVICAL SPINE 05/06/2007  . Osteoporosis 03/18/2007    Sigurd Sos, PT Mar 17, 2017 9:33 AM G-codes: Other PT primary Goals Status:  CK D/C status: CL PHYSICAL THERAPY DISCHARGE SUMMARY  Visits from Start of Care: 7  Current functional level related to goals / functional outcomes: Pt didn't return due to change in medial status.  See above for most current status   Remaining deficits: See above.     Education / Equipment: HEP, Economist Plan: Patient agrees to discharge.  Patient goals were partially met. Patient is being discharged due to a change in medical status.  ?????    Sigurd Sos, PT 04/16/17 7:46 AM    [facility] [address], [place], [place], [address] Phone: [phone]   Fax:  [phone]  Name: Cindy Schwartz MRN: [id number] Date of Birth: [birth year]

## 2017-03-10 NOTE — Patient Instructions (Addendum)
KEEP HEAD IN NEUTRAL AND SHOULDERS DOWN AND RELAXED   Hold tubing in right hand, arm forward. Pull arm back, elbow straight. Repeat __10__ times per set. Do __2__ sets per session. Do _1-2___ sessions per day.  Copyright  VHI. All rights reserved.     With resistive band anchored in door, grasp both ends. Keeping elbows bent, pull back, squeezing shoulder blades together. Hold _3__ seconds. Repeat _2x10___ times. Do _1-2___ sessions per day.  http://gt2.exer.us/98   [facility] [address], [address] [place], [place] [address] Phone # [phone] Fax [phone] 

## 2017-03-11 ENCOUNTER — Ambulatory Visit (INDEPENDENT_AMBULATORY_CARE_PROVIDER_SITE_OTHER)
Admission: RE | Admit: 2017-03-11 | Discharge: 2017-03-11 | Disposition: A | Discharge status: Home / Self Care | Payer: Medicare Other | Source: Ambulatory Visit | Attending: Internal Medicine | Admitting: Internal Medicine

## 2017-03-11 DIAGNOSIS — R634 Abnormal weight loss: Secondary | ICD-10-CM

## 2017-03-11 DIAGNOSIS — R1030 Lower abdominal pain, unspecified: Secondary | ICD-10-CM

## 2017-03-11 DIAGNOSIS — C50511 Malignant neoplasm of lower-outer quadrant of right female breast: Secondary | ICD-10-CM | POA: Diagnosis not present

## 2017-03-11 DIAGNOSIS — R531 Weakness: Secondary | ICD-10-CM

## 2017-03-11 MED ORDER — IOPAMIDOL (ISOVUE-300) INJECTION 61%
100.0000 mL | Freq: Once | INTRAVENOUS | Status: AC | PRN
  Administered 2017-03-11: 100 mL via INTRAVENOUS

## 2017-03-12 ENCOUNTER — Telehealth: Payer: Self-pay | Admitting: Hematology and Oncology

## 2017-03-12 ENCOUNTER — Encounter: Payer: Medicare Other | Admitting: Physical Therapy

## 2017-03-12 ENCOUNTER — Telehealth: Payer: Self-pay | Admitting: Internal Medicine

## 2017-03-12 NOTE — Telephone Encounter (Signed)
Pt waiting on results of labs.  Please call as soon as you can.

## 2017-03-12 NOTE — Telephone Encounter (Signed)
Patient was seen in April of 2017 and said she was going to [facility] for a second opinion and did not return.  She called today and wanted a second opinion from Dr Lindi Adie.  Because she has been seen here before I scheudled her for 7/5 at 9:15 for 30 mins

## 2017-03-13 ENCOUNTER — Other Ambulatory Visit: Payer: Self-pay | Admitting: Internal Medicine

## 2017-03-13 MED ORDER — ALENDRONATE SODIUM 70 MG PO TABS: 70.0000 mg | ORAL_TABLET | ORAL | 3 refills | Status: DC

## 2017-03-13 NOTE — Telephone Encounter (Signed)
Spoke with patient and informed her that the CT scan was unremarkable except for a compression fracture involving her lumbar spine. Start generic Fosamax 70mg  #12 one weekly, refill times 3. New Rx was called in to [place] Also start taking a calcium supplement, plus [phone] units of vitamin D daily Pt verbalized understanding.

## 2017-03-18 ENCOUNTER — Encounter: Payer: Self-pay | Admitting: Internal Medicine

## 2017-03-18 ENCOUNTER — Ambulatory Visit (INDEPENDENT_AMBULATORY_CARE_PROVIDER_SITE_OTHER): Payer: Medicare Other | Admitting: Internal Medicine

## 2017-03-18 VITALS — BP 124/70 | HR 75 | Temp 97.9°F | Ht 68.0 in | Wt 143.8 lb

## 2017-03-18 DIAGNOSIS — C50511 Malignant neoplasm of lower-outer quadrant of right female breast: Secondary | ICD-10-CM

## 2017-03-18 DIAGNOSIS — S32010A Wedge compression fracture of first lumbar vertebra, initial encounter for closed fracture: Secondary | ICD-10-CM | POA: Insufficient documentation

## 2017-03-18 DIAGNOSIS — S32010D Wedge compression fracture of first lumbar vertebra, subsequent encounter for fracture with routine healing: Secondary | ICD-10-CM | POA: Diagnosis not present

## 2017-03-18 NOTE — Progress Notes (Signed)
Subjective:    Patient ID: Cindy Schwartz, female    DOB: August 08, 1934, 83 y.o.   MRN: [id number]  HPI  83 year old patient who is seen today for follow-up of back pain.  Radiographs revealed a L1 compression fracture which is new.  The patient continues to improve. Number of questions concerning recurrent cancer of the right breast.  Discussed  Past Medical History:  Diagnosis Date  . Aneurysm of ascending aorta ([place])    a. 4.2-4.3cm in 2014.  . Cancer ([place]) 1980   ovarian cancer  . Elevated BP Transient 09/2014  . GERD (gastroesophageal reflux disease)   . H/O bone density study    2015  . H/O colonoscopy   . History of ovarian cancer    November 1979  . Insomnia   . Normal coronary arteries Cath - 09/2014  . Osteoporosis   . Raynaud's disease      Social History   Social History  . Marital status: Married    Spouse name: Publishing copy  . Number of children: 2  . Years of education: N/A   Occupational History  . retired Retired   Social History Main Topics  . Smoking status: Never Smoker  . Smokeless tobacco: Never Used  . Alcohol use No  . Drug use: No  . Sexual activity: Not on file   Other Topics Concern  . Not on file   Social History Narrative   She and her husband are from the [place] of [place].`   Children: Cindy Schwartz age 47, lives in [place], Cindy Schwartz, age 89, lives in [place], [place]    Past Surgical History:  Procedure Laterality Date  . ABDOMINAL HYSTERECTOMY    . APPENDECTOMY    . BILATERAL SALPINGOOPHORECTOMY    . LEFT HEART CATHETERIZATION WITH CORONARY ANGIOGRAM N/A 10/17/2014   Procedure: LEFT HEART CATHETERIZATION WITH CORONARY ANGIOGRAM;  Surgeon: [name], MD;  Location: [facility] CATH LAB;  Service: Cardiovascular;  Laterality: N/A;  . ovarian ca s/p taj/bso  1979    Family History  Problem Relation Age of Onset  . Healthy Mother   . Healthy Brother     Allergies  Allergen Reactions  . Chlorhexidine Gluconate  Rash    Current Outpatient Prescriptions on File Prior to Visit  Medication Sig Dispense Refill  . alendronate (FOSAMAX) 70 MG tablet Take 1 tablet (70 mg total) by mouth once a week. Take with a full glass of water on an empty stomach. 12 tablet 3  . cetirizine (ZYRTEC) 10 MG tablet Take 10 mg by mouth daily as needed for allergies.     Marland Kitchen NEXIUM 40 MG capsule TAKE 1 CAPSULE DAILY 90 capsule 3  . traMADol (ULTRAM) 50 MG tablet Take 1 tablet (50 mg total) by mouth 2 (two) times daily as needed. 30 tablet 0  . valACYclovir (VALTREX) 500 MG tablet Take 1 tablet (500 mg total) by mouth once a week. 12 tablet 3   No current facility-administered medications on file prior to visit.     BP 124/70 (BP Location: Left Arm, Patient Position: Sitting, Cuff Size: Normal)   Pulse 75   Temp 97.9 F (36.6 C) (Oral)   Ht 5\' 8"  (1.727 m)   Wt 143 lb 12.8 oz (65.2 kg)   SpO2 98%   BMI 21.86 kg/m     Review of Systems  Constitutional: Negative.   HENT: Negative for congestion, dental problem, hearing loss, rhinorrhea, sinus pressure, sore throat and  tinnitus.   Eyes: Negative for pain, discharge and visual disturbance.  Respiratory: Negative for cough and shortness of breath.   Cardiovascular: Negative for chest pain, palpitations and leg swelling.  Gastrointestinal: Negative for abdominal distention, abdominal pain, blood in stool, constipation, diarrhea, nausea and vomiting.  Genitourinary: Negative for difficulty urinating, dysuria, flank pain, frequency, hematuria, pelvic pain, urgency, vaginal bleeding, vaginal discharge and vaginal pain.  Musculoskeletal: Positive for back pain. Negative for arthralgias, gait problem and joint swelling.  Skin: Negative for rash.  Neurological: Negative for dizziness, syncope, speech difficulty, weakness, numbness and headaches.  Hematological: Negative for adenopathy.  Psychiatric/Behavioral: Negative for agitation, behavioral problems and dysphoric mood. The  patient is not nervous/anxious.        Objective:   Physical Exam  Constitutional: She appears well-developed and well-nourished. She appears distressed.  Psychiatric: She has a normal mood and affect. Her behavior is normal.    Anxious          Assessment & Plan:   L1 compression fracture.  Patient has a long history of osteoporosis and has had a thoracic fracture in the past.  She was treated for a number of years with alendronate.  This was eventually discontinued with concerns about osteonecrosis of the jaw.  Prolia therapy discussed and she will consider.  Continue calcium and vitamin D supplements.  Her pain continues to improve Recurrent right breast cancer.  The patient is scheduled for surgical and oncology follow-up  Nyoka Cowden

## 2017-03-18 NOTE — Patient Instructions (Addendum)
Spinal Compression Fracture A spinal compression fracture is a collapse of the bones that form the spine (vertebrae). With this type of fracture, the vertebrae become squashed (compressed) into a wedge shape. Most compression fractures happen in the middle or lower part of the spine. What are the causes? This condition may be caused by:  Thinning and loss of density in the bones (osteoporosis). This is the most common cause.  A fall.  A car or motorcycle accident.  Cancer.  Trauma, such as a heavy, direct hit to the head.  What increases the risk? You may be at greater risk for a spinal compression fracture if you:  Are 31 years old or older.  Have osteoporosis.  Have certain types of cancer, including: ? Multiple myeloma. ? Lymphoma. ? Prostate cancer. ? Lung cancer. ? Breast cancer.  What are the signs or symptoms? Symptoms of this condition include:  Severe pain.  Pain that gets worse over time.  Pain that is worse when you stand, walk, sit, or bend.  Sudden pain that is so bad that it is hard for you to move.  Bending or humping of the spine.  Gradual loss of height.  Numbness, tingling, or weakness in the back and legs.  Trouble walking.  Your symptoms will depend on the cause of the fracture and how quickly it develops. For example, fractures that are caused by osteoporosis can cause few symptoms, no symptoms, or symptoms that develop slowly over time. How is this diagnosed? This condition may be diagnosed based on symptoms, medical history, and a physical exam. During the physical exam, your health care provider may tap along the length of your spine to check for tenderness. Tests may be done to confirm the diagnosis. They may include:  A bone density test to check for osteoporosis.  Imaging tests, such as a spine X-ray, a CT scan, or MRI.  How is this treated? Treatment for this condition depends on the cause and severity of the condition.Some  fractures, such as those that are caused by osteoporosis, may heal on their own with supportive care. This may include:  Pain medicine.  Rest.  A back brace.  Physical therapy exercises.  Medicine that reduces bone pain.  Calcium and vitamin D supplements.  Fractures that cause the back to become misshapen, cause nerve pain or weakness, or do not respond to other treatment may be treated with a surgical procedure, such as:  Vertebroplasty. In this procedure, bone cement is injected into the collapsed vertebrae to stabilize them.  Balloon kyphoplasty. In this procedure, the collapsed vertebrae are expanded with a balloon and then bone cement is injected into them.  Spinal fusion. In this procedure, the collapsed vertebrae are connected (fused) to normal vertebrae.  Follow these instructions at home: General instructions  Take medicines only as directed by your health care provider.  Do not drive or operate heavy machinery while taking pain medicine.  If directed, apply ice to the injured area: ? Put ice in a plastic bag. ? Place a towel between your skin and the bag. ? Leave the ice on for 30 minutes every two hours at first. Then apply the ice as needed.  Wear your neck brace or back brace as directed by your health care provider.  Do not drink alcohol. Alcohol can interfere with your treatment.  Keep all follow-up visits as directed by your health care provider. This is important. It can help to prevent permanent injury, disability, and long-lasting (chronic) pain.  Activity  Stay in bed (on bed rest) only as directed by your health care provider. Being on bed rest for too long can make your condition worse.  Return to your normal activities as directed by your health care provider. Ask what activities are safe for you.  Do exercises to improve motion and strength in your back (physical therapy), as recommended by your health care provider.  Exercise regularly as  directed by your health care provider. Contact a health care provider if:  You have a fever.  You develop a cough that makes your pain worse.  Your pain medicine is not helping.  Your pain does not get better over time.  You cannot return to your normal activities as planned or expected. Get help right away if:  Your pain is very bad and it suddenly gets worse.  You are unable to move any body part (paralysis) that is below the level of your injury.  You have numbness, tingling, or weakness in any body part that is below the level of your injury.  You cannot control your bladder or bowels. This information is not intended to replace advice given to you by your health care provider. Make sure you discuss any questions you have with your health care provider. Document Released: 09/08/2005 Document Revised: 05/06/2016 Document Reviewed: 09/12/2014 Elsevier Interactive Patient Education  2018 [employer].  Please follow-up with general surgery and medical oncology as scheduled

## 2017-03-26 ENCOUNTER — Ambulatory Visit (HOSPITAL_BASED_OUTPATIENT_CLINIC_OR_DEPARTMENT_OTHER): Payer: Medicare Other | Admitting: Hematology and Oncology

## 2017-03-26 ENCOUNTER — Encounter: Payer: Self-pay | Admitting: Hematology and Oncology

## 2017-03-26 DIAGNOSIS — C50511 Malignant neoplasm of lower-outer quadrant of right female breast: Secondary | ICD-10-CM

## 2017-03-26 DIAGNOSIS — D0511 Intraductal carcinoma in situ of right breast: Secondary | ICD-10-CM | POA: Diagnosis not present

## 2017-03-26 DIAGNOSIS — Z171 Estrogen receptor negative status [ER-]: Secondary | ICD-10-CM | POA: Diagnosis not present

## 2017-03-26 NOTE — Assessment & Plan Note (Signed)
Right lumpectomy 02/04/2016: DCIS high-grade, solid, cribriform and micropapillary types with comedo necrosis 3.3 cm, ER 0%, PR 0%, Tis Nx stage 0 with positive anterior margin  Relapsed disease 02/23/2017: At the lumpectomy site recurrent high-grade DCIS ER 0%, PR 0%  Recommendation: 1.   Patient follows with the [facility] for her breast care. Return to clinic on an as-needed basis.

## 2017-03-26 NOTE — Progress Notes (Signed)
Patient Care Team: Marletta Lor, MD as PCP - General (Internal Medicine)  DIAGNOSIS:  Encounter Diagnosis  Name Primary?  . Malignant neoplasm of lower-outer quadrant of right breast of female, estrogen receptor negative ([place])     SUMMARY OF ONCOLOGIC HISTORY:   Breast cancer of lower-outer quadrant of right female breast ([place])   12/28/2015 Initial Diagnosis    Right breast needle biopsy 8:00: DCIS with calcifications, high-grade, ER 0%, PR 0%, Tis N0 stage 0      01/14/2016 Breast MRI    Right breast lesion 2.7 x 2.4 x 2.2 cm; initial mammogram and ultrasound revealed 2.4 cm irregular hypoechoic mass at 8:00 position      02/03/2017 Surgery    Right lumpectomy at [facility]: DCIS grade 3, solid, cribriform and micropapillary type with comedonecrosis 3.3 cm, anterior margin positive Tis Nx stage 0      02/23/2017 Relapse/Recurrence    [facility]: Pleomorphic calcifications in the right breast along prior lumpectomy site: DCIS high-grade micropapillary features ER 0%, PR 0%       CHIEF COMPLIANT: Follow-up after recent recurrence of DCIS in the right breast  INTERVAL HISTORY: Cindy Schwartz is a 83 year old with above-mentioned history of right breast DCIS who was seen by [place] a year ago. She underwent lumpectomy at [facility]. There was positive margin. Because her DCIS was ER/PR negative she did not go on any antiestrogen therapy. She was noted to have pleomorphic calcifications in the right breast and a biopsy revealed recurrence of high-grade DCIS. This was close to the prior lumpectomy site. It is very possible that the positive anterior margin was the reason for the DCIS recurrence. She was seen by [facility] physicians and she came down to [place] for a second opinion.  REVIEW OF SYSTEMS:   Constitutional: Denies fevers, chills or abnormal weight loss Eyes: Denies blurriness of vision Ears, nose, mouth, throat, and face: Denies mucositis or sore throat Respiratory: Denies cough, dyspnea or  wheezes Cardiovascular: Denies palpitation, chest discomfort Gastrointestinal:  Denies nausea, heartburn or change in bowel habits Skin: Denies abnormal skin rashes Lymphatics: Denies new lymphadenopathy or easy bruising Neurological:Denies numbness, tingling or new weaknesses Behavioral/Psych: Mood is stable, no new changes  Extremities: No lower extremity edema Breast:  denies any pain or lumps or nodules in either breasts All other systems were reviewed with the patient and are negative.  I have reviewed the past medical history, past surgical history, social history and family history with the patient and they are unchanged from previous note.  ALLERGIES:  is allergic to chlorhexidine gluconate.  MEDICATIONS:  Current Outpatient Prescriptions  Medication Sig Dispense Refill  . alendronate (FOSAMAX) 70 MG tablet Take 1 tablet (70 mg total) by mouth once a week. Take with a full glass of water on an empty stomach. 12 tablet 3  . cetirizine (ZYRTEC) 10 MG tablet Take 10 mg by mouth daily as needed for allergies.     Marland Kitchen NEXIUM 40 MG capsule TAKE 1 CAPSULE DAILY 90 capsule 3  . traMADol (ULTRAM) 50 MG tablet Take 1 tablet (50 mg total) by mouth 2 (two) times daily as needed. 30 tablet 0  . valACYclovir (VALTREX) 500 MG tablet Take 1 tablet (500 mg total) by mouth once a week. 12 tablet 3   No current facility-administered medications for this visit.     PHYSICAL EXAMINATION: ECOG PERFORMANCE STATUS: 1 - Symptomatic but completely ambulatory  Vitals:   03/26/17 0817  BP: 135/78  Pulse: 79  Resp:  18  Temp: 98.1 F (36.7 C)   Filed Weights   03/26/17 0817  Weight: 143 lb 4.8 oz (65 kg)    GENERAL:alert, no distress and comfortable SKIN: skin color, texture, turgor are normal, no rashes or significant lesions EYES: normal, Conjunctiva are pink and non-injected, sclera clear OROPHARYNX:no exudate, no erythema and lips, buccal mucosa, and tongue normal  NECK: supple, thyroid  normal size, non-tender, without nodularity LYMPH:  no palpable lymphadenopathy in the cervical, axillary or inguinal LUNGS: clear to auscultation and percussion with normal breathing effort HEART: regular rate & rhythm and no murmurs and no lower extremity edema ABDOMEN:abdomen soft, non-tender and normal bowel sounds MUSCULOSKELETAL:no cyanosis of digits and no clubbing  NEURO: alert & oriented x 3 with fluent speech, no focal motor/sensory deficits EXTREMITIES: No lower extremity edema BREAST: No palpable masses or nodules in either right or left breasts. No palpable axillary supraclavicular or infraclavicular adenopathy no breast tenderness or nipple discharge. (exam performed in the presence of a chaperone)  LABORATORY DATA:  I have reviewed the data as listed   Chemistry      Component Value Date/Time   NA 135 03/09/2017 1002   K 4.8 03/09/2017 1002   CL 99 03/09/2017 1002   CO2 30 03/09/2017 1002   BUN 15 03/09/2017 1002   CREATININE 0.64 03/09/2017 1002   CREATININE 0.76 10/01/2015 1445      Component Value Date/Time   CALCIUM 10.3 03/09/2017 1002   ALKPHOS 77 03/09/2017 1002   AST 18 03/09/2017 1002   ALT 13 03/09/2017 1002   BILITOT 0.5 03/09/2017 1002       Lab Results  Component Value Date   WBC 6.5 03/09/2017   HGB 13.3 03/09/2017   HCT 40.3 03/09/2017   MCV 85.0 03/09/2017   PLT 287.0 03/09/2017   NEUTROABS 3.7 03/09/2017    ASSESSMENT & PLAN:  Breast cancer of lower-outer quadrant of right female breast ([place]) Right lumpectomy 02/04/2016: DCIS high-grade, solid, cribriform and micropapillary types with comedo necrosis 3.3 cm, ER 0%, PR 0%, Tis Nx stage 0 with positive anterior margin  Relapsed disease 02/23/2017: At the lumpectomy site recurrent high-grade DCIS ER 0%, PR 0%  Recommendation: 1. Mastectomy with sentinel lymph node biopsy Patient would like to see Dr. [name] for her mastectomy. I discussed different options including reexcision  lumpectomy versus mastectomy. If she were to undergo reexcision lumpectomy then she would definitely need adjuvant radiation. Based on her breast size, I believe that an mastectomy might be the best option for her. However Dr. [name] will have to make a final decision on that.  Return to clinic one week after surgery to go over the final pathology report.  I spent 25 minutes talking to the patient of which more than half was spent in counseling and coordination of care.  No orders of the defined types were placed in this encounter.  The patient has a good understanding of the overall plan. she agrees with it. she will call with any problems that may develop before the next visit here.   Rulon Eisenmenger, MD 03/26/17

## 2017-04-02 ENCOUNTER — Other Ambulatory Visit: Payer: Self-pay | Admitting: General Surgery

## 2017-04-02 DIAGNOSIS — D0511 Intraductal carcinoma in situ of right breast: Secondary | ICD-10-CM

## 2017-04-08 ENCOUNTER — Ambulatory Visit: Payer: Medicare Other | Admitting: Internal Medicine

## 2017-04-24 NOTE — Pre-Procedure Instructions (Signed)
Cindy Schwartz  04/24/2017      [facility], [place] [address] Phone: [phone] Fax: [phone]  [facility] [place], [place] [address] [place] [address] Phone: [phone] Fax: [phone]  [facility] [place], [place] - [place] AT [place] [address] Phone: [phone] Fax: [phone]    Your procedure is scheduled on August 16  Report to [place] at [place].M.  Call this number if you have problems the morning of surgery:  [phone]   Remember:  Do not eat food or drink liquids after midnight.   Take these medicines the morning of surgery with A SIP OF WATER cetirizine (ZYRTEC)  If needed, NEXIUM valACYclovir (VALTREX) if needed  7 days prior to surgery STOP taking any Aspirin, Aleve, Naproxen, Ibuprofen, Motrin, Advil, Goody's, BC's, all herbal medications, fish oil, and all vitamins     Do not wear jewelry, make-up or nail polish.  Do not wear lotions, powders, or perfumes, or deoderant.  Do not shave 48 hours prior to surgery.  Men may shave face and neck.  Do not bring valuables to the hospital.  [facility] is not responsible for any belongings or valuables.  Contacts, dentures or bridgework may not be worn into surgery.  Leave your suitcase in the car.  After surgery it may be brought to your room.  For patients admitted to the hospital, discharge time will be determined by your treatment team.  Patients discharged the day of surgery will not be allowed to drive home.    Special instructions:   [facility]- Preparing For Surgery  Before surgery, you can play an important role. Because skin is not sterile, your skin needs to be as free of germs as possible. You can reduce the number of germs on your skin by  washing with CHG (chlorahexidine gluconate) Soap before surgery.  CHG is an antiseptic cleaner which kills germs and bonds with the skin to continue killing germs even after washing.  Please do not use if you have an allergy to CHG or antibacterial soaps. If your skin becomes reddened/irritated stop using the CHG.  Do not shave (including legs and underarms) for at least 48 hours prior to first CHG shower. It is OK to shave your face.  Please follow these instructions carefully.   1. Shower the NIGHT BEFORE SURGERY and the MORNING OF SURGERY with CHG.   2. If you chose to wash your hair, wash your hair first as usual with your normal shampoo.  3. After you shampoo, rinse your hair and body thoroughly to remove the shampoo.  4. Use CHG as you would any other liquid soap. You can apply CHG directly to the skin and wash gently with a scrungie or a clean washcloth.   5. Apply the CHG Soap to your body ONLY FROM THE NECK DOWN.  Do not use on open wounds or open sores. Avoid contact with your eyes, ears, mouth and genitals (private parts). Wash genitals (private parts) with your normal soap.  6. Wash thoroughly, paying special attention to the area where your surgery will be performed.  7. Thoroughly rinse your body with warm water from the neck down.  8. DO NOT shower/wash with your normal soap after  using and rinsing off the CHG Soap.  9. Pat yourself dry with a CLEAN TOWEL.   10. Wear CLEAN PAJAMAS   11. Place CLEAN SHEETS on your bed the night of your first shower and DO NOT SLEEP WITH PETS.    Day of Surgery: Do not apply any deodorants/lotions. Please wear clean clothes to the hospital/surgery center.      Please read over the following fact sheets that you were given.

## 2017-04-27 ENCOUNTER — Telehealth: Payer: Self-pay | Admitting: Cardiovascular Disease

## 2017-04-27 ENCOUNTER — Encounter (HOSPITAL_COMMUNITY): Payer: Self-pay

## 2017-04-27 ENCOUNTER — Encounter (HOSPITAL_COMMUNITY)
Admission: RE | Admit: 2017-04-27 | Discharge: 2017-04-27 | Disposition: A | Discharge status: Home / Self Care | Payer: Medicare Other | Source: Ambulatory Visit | Attending: General Surgery | Admitting: General Surgery

## 2017-04-27 DIAGNOSIS — C50911 Malignant neoplasm of unspecified site of right female breast: Secondary | ICD-10-CM | POA: Insufficient documentation

## 2017-04-27 DIAGNOSIS — Z01812 Encounter for preprocedural laboratory examination: Secondary | ICD-10-CM | POA: Insufficient documentation

## 2017-04-27 HISTORY — DX: Anxiety disorder, unspecified: F41.9

## 2017-04-27 LAB — BASIC METABOLIC PANEL
Anion gap: 8 (ref 5–15)
BUN: 15 mg/dL (ref 6–20)
CHLORIDE: 99 mmol/L — AB (ref 101–111)
CO2: 26 mmol/L (ref 22–32)
CREATININE: 0.65 mg/dL (ref 0.44–1.00)
Calcium: 9.9 mg/dL (ref 8.9–10.3)
GFR calc Af Amer: >60 mL/min (ref >60)
GFR calc non Af Amer: >60 mL/min (ref >60)
GLUCOSE: 78 mg/dL (ref 65–99)
POTASSIUM: 4.8 mmol/L (ref 3.5–5.1)
Sodium: 133 mmol/L — ABNORMAL LOW (ref 135–145)

## 2017-04-27 LAB — CBC
HEMATOCRIT: 39.9 % (ref 36.0–46.0)
HEMOGLOBIN: 13.5 g/dL (ref 12.0–15.0)
MCH: 27.8 pg (ref 26.0–34.0)
MCHC: 33.8 g/dL (ref 30.0–36.0)
MCV: 82.3 fL (ref 78.0–100.0)
Platelets: 267 10*3/uL (ref 150–400)
RBC: 4.85 MIL/uL (ref 3.87–5.11)
RDW: 14.2 % (ref 11.5–15.5)
WBC: 6.3 10*3/uL (ref 4.0–10.5)

## 2017-04-27 NOTE — Telephone Encounter (Signed)
Patient is having a mastectomy on 05/05/17.  Need to know if I  need to take something for my heart before the  surgery.

## 2017-04-28 NOTE — Progress Notes (Signed)
Anesthesia Chart Review:  Pt is an 83 year old female scheduled for R total mastectomy with R axillary sentinel node biopsy on 05/05/2017 with [name], MD  - PCP is Bluford Kaufmann, MD - Cardiologist is Jenkins Rouge, MD, last office visit 01/14/17 - CT surgeon is Gilford Raid, MD, last office visit 12/10/16  PMH includes:  Ascending aortic aneurysm (stable at 4.1cm 12/10/16; repeat scan in 2 years), Raynaud's, elevated BP, ovarian cancer, GERD.  Never smoker. BMI 22  Medications include: Nexium  BP 129/74   Pulse 80   Temp 36.6 C   Resp 20   Ht 5\' 7"  (1.702 m)   Wt 141 lb 4.8 oz (64.1 kg)   SpO2 100%   BMI 22.13 kg/m   Preoperative labs reviewed.    EKG 06/12/16: sinus rhythm with 1st degree AV block. RBBB. Septal infarct, age undetermined.    MRA chest 12/10/16:  1. No enlargement of mild 4.1 cm ascending aortic aneurysm, without complicating features.  Holter monitor 09/25/15:  - NSR - PAC;s / PVC;s - Short less than 8 beat runs SVT  Echo 10/17/14:  - Left ventricle: The cavity size was normal. Wall thickness wasnormal. Systolic function was normal. The estimated ejectionfraction was in the range of 50% to 55%. Wall motion was normal;there were no regional wall motion abnormalities. Dopplerparameters are consistent with abnormal left ventricularrelaxation (grade 1 diastolic dysfunction). - Mitral valve: Calcified annulus. There was mild regurgitation. - Impressions: Normal LV function; grade 1 diastolic dysfunction; aortic rootdoes not appear to be dilated but ascending aorta not visualized;mild MR and TR.  Cardiac cath 10/17/14:  - Normal LV function. - Normal coronary arteries. - Mild to moderate dilatation of the ascending aorta without evidence for dissection.  Carotid duplex 05/27/13:  - Normal coronary arteries B - Patent vertebral arteries with antegrade flow  If no changes, I anticipate pt can proceed with surgery as scheduled.   Willeen Cass,  FNP-BC [facility]/Anesthesiology Phone: [phone] 04/28/2017 1:21 PM

## 2017-04-30 NOTE — Telephone Encounter (Signed)
The patient returned my call this afternoon.  She had spoken to her surgeon who mentioned he was going to call Dr. Johnsie Cancel.  Advised they typically call or fax a clearance request to cardiology but I do not seen anything on paper or in her chart.  The patient will follow up with her surgeon and ask them to send request for clearance if needed.

## 2017-04-30 NOTE — Telephone Encounter (Signed)
Left message for patient to call back  

## 2017-05-03 NOTE — Telephone Encounter (Signed)
I sent epic note to [name] to clear for surgery

## 2017-05-04 NOTE — Anesthesia Preprocedure Evaluation (Addendum)
Anesthesia Evaluation  Patient identified by MRN, date of birth, ID band Patient awake    Reviewed: Allergy & Precautions, NPO status , Patient's Chart, lab work & pertinent test results  Airway Mallampati: II  TM Distance: >3 FB Neck ROM: Full    Dental  (+) Dental Advisory Given   Pulmonary neg pulmonary ROS,    breath sounds clear to auscultation       Cardiovascular + Peripheral Vascular Disease   Rhythm:Regular Rate:Normal     Neuro/Psych negative neurological ROS     GI/Hepatic Neg liver ROS, GERD  ,  Endo/Other  negative endocrine ROS  Renal/GU negative Renal ROS     Musculoskeletal  (+) Arthritis ,   Abdominal   Peds  Hematology negative hematology ROS (+)   Anesthesia Other Findings   Reproductive/Obstetrics                            Lab Results  Component Value Date   WBC 6.3 04/27/2017   HGB 13.5 04/27/2017   HCT 39.9 04/27/2017   MCV 82.3 04/27/2017   PLT 267 04/27/2017   Lab Results  Component Value Date   CREATININE 0.65 04/27/2017   BUN 15 04/27/2017   NA 133 (L) 04/27/2017   K 4.8 04/27/2017   CL 99 (L) 04/27/2017   CO2 26 04/27/2017   Echo 10/17/14:  - Left ventricle: The cavity size was normal. Wall thickness wasnormal. Systolic function was normal. The estimated ejectionfraction was in the range of 50% to 55%. Wall motion was normal;there were no regional wall motion abnormalities. Dopplerparameters are consistent with abnormal left ventricularrelaxation (grade 1 diastolic dysfunction). - Mitral valve: Calcified annulus. There was mild regurgitation. - Impressions: Normal LV function; grade 1 diastolic dysfunction; aortic rootdoes not appear to be dilated but ascending aorta not visualized;mild MR and TR.  Cardiac cath 10/17/14:  - Normal LV function. - Normal coronary arteries. - Mild to moderate dilatation of the ascending aorta without evidence  for dissection. Anesthesia Physical Anesthesia Plan  ASA: II  Anesthesia Plan: General   Post-op Pain Management:  Regional for Post-op pain   Induction: Intravenous  PONV Risk Score and Plan: 3 and Ondansetron, Dexamethasone and Treatment may vary due to age or medical condition  Airway Management Planned: LMA  Additional Equipment:   Intra-op Plan:   Post-operative Plan: Extubation in OR  Informed Consent: I have reviewed the patients History and Physical, chart, labs and discussed the procedure including the risks, benefits and alternatives for the proposed anesthesia with the patient or authorized representative who has indicated his/her understanding and acceptance.   Dental advisory given  Plan Discussed with: CRNA  Anesthesia Plan Comments:        Anesthesia Quick Evaluation

## 2017-05-05 ENCOUNTER — Observation Stay (HOSPITAL_COMMUNITY)
Admission: RE | Admit: 2017-05-05 | Discharge: 2017-05-06 | Disposition: A | Discharge status: Home / Self Care | Payer: Medicare Other | Source: Ambulatory Visit | Attending: General Surgery | Admitting: General Surgery

## 2017-05-05 ENCOUNTER — Encounter (HOSPITAL_COMMUNITY)
Admission: RE | Disposition: A | Discharge status: Home / Self Care | Payer: Self-pay | Source: Ambulatory Visit | Attending: General Surgery

## 2017-05-05 ENCOUNTER — Encounter (HOSPITAL_COMMUNITY)
Admission: RE | Admit: 2017-05-05 | Discharge: 2017-05-05 | Disposition: A | Discharge status: Home / Self Care | Payer: Medicare Other | Source: Ambulatory Visit | Attending: General Surgery | Admitting: General Surgery

## 2017-05-05 ENCOUNTER — Encounter (HOSPITAL_COMMUNITY): Payer: Self-pay

## 2017-05-05 ENCOUNTER — Ambulatory Visit (HOSPITAL_COMMUNITY): Payer: Medicare Other | Admitting: Anesthesiology

## 2017-05-05 ENCOUNTER — Ambulatory Visit (HOSPITAL_COMMUNITY): Payer: Medicare Other | Admitting: Emergency Medicine

## 2017-05-05 DIAGNOSIS — D0511 Intraductal carcinoma in situ of right breast: Principal | ICD-10-CM | POA: Insufficient documentation

## 2017-05-05 DIAGNOSIS — Z8543 Personal history of malignant neoplasm of ovary: Secondary | ICD-10-CM | POA: Diagnosis not present

## 2017-05-05 DIAGNOSIS — I7300 Raynaud's syndrome without gangrene: Secondary | ICD-10-CM | POA: Insufficient documentation

## 2017-05-05 DIAGNOSIS — K219 Gastro-esophageal reflux disease without esophagitis: Secondary | ICD-10-CM | POA: Insufficient documentation

## 2017-05-05 DIAGNOSIS — C50911 Malignant neoplasm of unspecified site of right female breast: Secondary | ICD-10-CM | POA: Diagnosis present

## 2017-05-05 DIAGNOSIS — M810 Age-related osteoporosis without current pathological fracture: Secondary | ICD-10-CM | POA: Insufficient documentation

## 2017-05-05 DIAGNOSIS — Z79899 Other long term (current) drug therapy: Secondary | ICD-10-CM | POA: Insufficient documentation

## 2017-05-05 DIAGNOSIS — I739 Peripheral vascular disease, unspecified: Secondary | ICD-10-CM | POA: Insufficient documentation

## 2017-05-05 DIAGNOSIS — Z923 Personal history of irradiation: Secondary | ICD-10-CM | POA: Insufficient documentation

## 2017-05-05 HISTORY — DX: Malignant neoplasm of unspecified site of unspecified female breast: C50.919

## 2017-05-05 HISTORY — PX: SIMPLE MASTECTOMY WITH AXILLARY SENTINEL NODE BIOPSY: SHX6098

## 2017-05-05 SURGERY — SIMPLE MASTECTOMY WITH AXILLARY SENTINEL NODE BIOPSY
Anesthesia: General | Site: Breast | Laterality: Right

## 2017-05-05 MED ORDER — 0.9 % SODIUM CHLORIDE (POUR BTL) OPTIME
TOPICAL | Status: DC | PRN
  Administered 2017-05-05: 1000 mL

## 2017-05-05 MED ORDER — PROPOFOL 10 MG/ML IV BOLUS
INTRAVENOUS | Status: DC | PRN
  Administered 2017-05-05: 30 mg via INTRAVENOUS
  Administered 2017-05-05: 120 mg via INTRAVENOUS

## 2017-05-05 MED ORDER — PHENYLEPHRINE HCL 10 MG/ML IJ SOLN
INTRAVENOUS | Status: DC | PRN
  Administered 2017-05-05: 40 ug/min via INTRAVENOUS

## 2017-05-05 MED ORDER — TRAMADOL HCL 50 MG PO TABS: 50.0000 mg | ORAL_TABLET | Freq: Four times a day (QID) | ORAL | Status: DC | PRN

## 2017-05-05 MED ORDER — METHOCARBAMOL 500 MG PO TABS
500.0000 mg | ORAL_TABLET | Freq: Four times a day (QID) | ORAL | Status: DC | PRN
  Administered 2017-05-05: 500 mg via ORAL
  Filled 2017-05-05: qty 1

## 2017-05-05 MED ORDER — ACETAMINOPHEN 500 MG PO TABS
1000.0000 mg | ORAL_TABLET | ORAL | Status: AC
  Administered 2017-05-05: 1000 mg via ORAL

## 2017-05-05 MED ORDER — ONDANSETRON 4 MG PO TBDP: 4.0000 mg | ORAL_TABLET | Freq: Four times a day (QID) | ORAL | Status: DC | PRN

## 2017-05-05 MED ORDER — ONDANSETRON HCL 4 MG/2ML IJ SOLN
INTRAMUSCULAR | Status: DC | PRN
  Administered 2017-05-05: 4 mg via INTRAVENOUS

## 2017-05-05 MED ORDER — HEMOSTATIC AGENTS (NO CHARGE) OPTIME
TOPICAL | Status: DC | PRN
  Administered 2017-05-05: 1 via TOPICAL

## 2017-05-05 MED ORDER — EPHEDRINE 5 MG/ML INJ
INTRAVENOUS | Status: AC
  Filled 2017-05-05: qty 10

## 2017-05-05 MED ORDER — FENTANYL CITRATE (PF) 100 MCG/2ML IJ SOLN: 25.0000 ug | INTRAMUSCULAR | Status: DC | PRN

## 2017-05-05 MED ORDER — TECHNETIUM TC 99M SULFUR COLLOID FILTERED
1.0000 | Freq: Once | INTRAVENOUS | Status: AC | PRN
  Administered 2017-05-05: 1 via INTRADERMAL

## 2017-05-05 MED ORDER — METHYLENE BLUE 0.5 % INJ SOLN
INTRAVENOUS | Status: AC
  Filled 2017-05-05: qty 10

## 2017-05-05 MED ORDER — SODIUM CHLORIDE 0.9 % IV SOLN
INTRAVENOUS | Status: DC
  Administered 2017-05-05: 10:00:00 via INTRAVENOUS

## 2017-05-05 MED ORDER — LACTATED RINGERS IV SOLN
INTRAVENOUS | Status: DC | PRN
  Administered 2017-05-05: 07:00:00 via INTRAVENOUS

## 2017-05-05 MED ORDER — GABAPENTIN 300 MG PO CAPS
ORAL_CAPSULE | ORAL | Status: AC
  Filled 2017-05-05: qty 1

## 2017-05-05 MED ORDER — ACETAMINOPHEN 500 MG PO TABS
ORAL_TABLET | ORAL | Status: AC
  Filled 2017-05-05: qty 2

## 2017-05-05 MED ORDER — CEFAZOLIN SODIUM-DEXTROSE 2-4 GM/100ML-% IV SOLN
INTRAVENOUS | Status: AC
  Filled 2017-05-05: qty 100

## 2017-05-05 MED ORDER — ACETAMINOPHEN 500 MG PO TABS
1000.0000 mg | ORAL_TABLET | Freq: Four times a day (QID) | ORAL | Status: DC
  Administered 2017-05-05 – 2017-05-06 (×3): 1000 mg via ORAL
  Filled 2017-05-05 (×3): qty 2

## 2017-05-05 MED ORDER — BUPIVACAINE-EPINEPHRINE (PF) 0.5% -1:200000 IJ SOLN
INTRAMUSCULAR | Status: DC | PRN
  Administered 2017-05-05: 30 mL

## 2017-05-05 MED ORDER — DEXAMETHASONE SODIUM PHOSPHATE 10 MG/ML IJ SOLN
INTRAMUSCULAR | Status: DC | PRN
  Administered 2017-05-05: 10 mg via INTRAVENOUS

## 2017-05-05 MED ORDER — PANTOPRAZOLE SODIUM 40 MG PO TBEC
40.0000 mg | DELAYED_RELEASE_TABLET | Freq: Every day | ORAL | Status: DC
  Filled 2017-05-05: qty 1

## 2017-05-05 MED ORDER — ONDANSETRON HCL 4 MG/2ML IJ SOLN: 4.0000 mg | Freq: Four times a day (QID) | INTRAMUSCULAR | Status: DC | PRN

## 2017-05-05 MED ORDER — PROPOFOL 1000 MG/100ML IV EMUL
INTRAVENOUS | Status: AC
  Filled 2017-05-05: qty 100

## 2017-05-05 MED ORDER — SIMETHICONE 80 MG PO CHEW: 40.0000 mg | CHEWABLE_TABLET | Freq: Four times a day (QID) | ORAL | Status: DC | PRN

## 2017-05-05 MED ORDER — LORATADINE 10 MG PO TABS
10.0000 mg | ORAL_TABLET | Freq: Every day | ORAL | Status: DC
  Filled 2017-05-05: qty 1

## 2017-05-05 MED ORDER — FENTANYL CITRATE (PF) 250 MCG/5ML IJ SOLN
INTRAMUSCULAR | Status: AC
  Filled 2017-05-05: qty 5

## 2017-05-05 MED ORDER — SODIUM CHLORIDE 0.9 % IJ SOLN
INTRAMUSCULAR | Status: AC
  Filled 2017-05-05: qty 10

## 2017-05-05 MED ORDER — LIDOCAINE HCL (CARDIAC) 20 MG/ML IV SOLN
INTRAVENOUS | Status: DC | PRN
  Administered 2017-05-05: 30 mg via INTRAVENOUS

## 2017-05-05 MED ORDER — FENTANYL CITRATE (PF) 100 MCG/2ML IJ SOLN
INTRAMUSCULAR | Status: DC | PRN
  Administered 2017-05-05 (×2): 50 ug via INTRAVENOUS
  Administered 2017-05-05: 25 ug via INTRAVENOUS
  Administered 2017-05-05: 50 ug via INTRAVENOUS
  Administered 2017-05-05: 25 ug via INTRAVENOUS

## 2017-05-05 MED ORDER — GABAPENTIN 300 MG PO CAPS
300.0000 mg | ORAL_CAPSULE | ORAL | Status: AC
  Administered 2017-05-05: 300 mg via ORAL

## 2017-05-05 MED ORDER — LIDOCAINE 2% (20 MG/ML) 5 ML SYRINGE
INTRAMUSCULAR | Status: AC
  Filled 2017-05-05: qty 5

## 2017-05-05 MED ORDER — CEFAZOLIN SODIUM-DEXTROSE 2-4 GM/100ML-% IV SOLN
2.0000 g | INTRAVENOUS | Status: AC
  Administered 2017-05-05: 2 g via INTRAVENOUS

## 2017-05-05 MED ORDER — SUCCINYLCHOLINE CHLORIDE 200 MG/10ML IV SOSY
PREFILLED_SYRINGE | INTRAVENOUS | Status: AC
  Filled 2017-05-05: qty 10

## 2017-05-05 SURGICAL SUPPLY — 44 items
APPLIER CLIP 9.375 MED OPEN (MISCELLANEOUS) ×3
BINDER BREAST LRG (GAUZE/BANDAGES/DRESSINGS) ×3 IMPLANT
BINDER BREAST XLRG (GAUZE/BANDAGES/DRESSINGS) IMPLANT
BIOPATCH RED 1 DISK 7.0 (GAUZE/BANDAGES/DRESSINGS) ×2 IMPLANT
BIOPATCH RED 1IN DISK 7.0MM (GAUZE/BANDAGES/DRESSINGS) ×1
CANISTER SUCT 3000ML PPV (MISCELLANEOUS) ×3 IMPLANT
CHLORAPREP W/TINT 26ML (MISCELLANEOUS) ×3 IMPLANT
CLIP APPLIE 9.375 MED OPEN (MISCELLANEOUS) ×1 IMPLANT
CLOSURE WOUND 1/2 X4 (GAUZE/BANDAGES/DRESSINGS) ×1
CONT SPEC 4OZ CLIKSEAL STRL BL (MISCELLANEOUS) ×3 IMPLANT
COVER PROBE W GEL 5X96 (DRAPES) ×3 IMPLANT
COVER SURGICAL LIGHT HANDLE (MISCELLANEOUS) ×3 IMPLANT
DERMABOND ADVANCED (GAUZE/BANDAGES/DRESSINGS) ×2
DERMABOND ADVANCED .7 DNX12 (GAUZE/BANDAGES/DRESSINGS) ×1 IMPLANT
DRAIN CHANNEL 19F RND (DRAIN) ×3 IMPLANT
DRAPE CHEST BREAST 15X10 FENES (DRAPES) ×3 IMPLANT
DRSG TEGADERM 2-3/8X2-3/4 SM (GAUZE/BANDAGES/DRESSINGS) IMPLANT
DRSG TEGADERM 4X4.75 (GAUZE/BANDAGES/DRESSINGS) ×3 IMPLANT
ELECT CAUTERY BLADE 6.4 (BLADE) ×3 IMPLANT
ELECT REM PT RETURN 9FT ADLT (ELECTROSURGICAL) ×3
ELECTRODE REM PT RTRN 9FT ADLT (ELECTROSURGICAL) ×1 IMPLANT
EVACUATOR SILICONE 100CC (DRAIN) ×3 IMPLANT
GLOVE BIO SURGEON STRL SZ7 (GLOVE) ×6 IMPLANT
GLOVE BIOGEL PI IND STRL 7.0 (GLOVE) ×1 IMPLANT
GLOVE BIOGEL PI IND STRL 7.5 (GLOVE) ×1 IMPLANT
GLOVE BIOGEL PI INDICATOR 7.0 (GLOVE) ×2
GLOVE BIOGEL PI INDICATOR 7.5 (GLOVE) ×2
GOWN STRL REUS W/ TWL LRG LVL3 (GOWN DISPOSABLE) ×2 IMPLANT
GOWN STRL REUS W/TWL LRG LVL3 (GOWN DISPOSABLE) ×6
HEMOSTAT ARISTA ABSORB 3G PWDR (MISCELLANEOUS) ×3 IMPLANT
KIT BASIN OR (CUSTOM PROCEDURE TRAY) ×3 IMPLANT
KIT ROOM TURNOVER OR (KITS) ×3 IMPLANT
NS IRRIG 1000ML POUR BTL (IV SOLUTION) ×3 IMPLANT
PACK GENERAL/GYN (CUSTOM PROCEDURE TRAY) ×3 IMPLANT
PAD ARMBOARD 7.5X6 YLW CONV (MISCELLANEOUS) ×3 IMPLANT
PIN SAFETY STERILE (MISCELLANEOUS) ×3 IMPLANT
SPECIMEN JAR X LARGE (MISCELLANEOUS) ×3 IMPLANT
STAPLER VISISTAT 35W (STAPLE) ×3 IMPLANT
STRIP CLOSURE SKIN 1/2X4 (GAUZE/BANDAGES/DRESSINGS) ×2 IMPLANT
SUT ETHILON 2 0 FS 18 (SUTURE) ×6 IMPLANT
SUT MNCRL AB 4-0 PS2 18 (SUTURE) ×3 IMPLANT
SUT SILK 2 0 FS (SUTURE) ×3 IMPLANT
SUT VIC AB 3-0 SH 8-18 (SUTURE) ×6 IMPLANT
TOWEL OR 17X24 6PK STRL BLUE (TOWEL DISPOSABLE) ×3 IMPLANT

## 2017-05-05 NOTE — H&P (Signed)
Cindy Schwartz is an 83 y.o. female.   Chief Complaint: right breast cancer HPI: 44 yof with recurrent right breast cancer.  In 4/17 had mm abnl that was biopseid and is hg hr negative dcis.  Mri shows a 2.7x2.4x2.2 cm lesion.  [facility] that [facility] g 3 dcis with comedo necrosis that was 3.3 cm.  Margins were eventually cleared and had hematoma that was evacuated.  Did not do radiotherapy.  Repeat mm showed pleomorphic calcs at lumpectomy site that on biopsy is hg dcis that is er/pr negative. She has some chronic pain after surgery and radiation and has indentation in that breast  Past Medical History:  Diagnosis Date  . Aneurysm of ascending aorta ([place])    a. 4.2-4.3cm in 2014.  Marland Kitchen Anxiety   . Cancer ([place]) 1980   ovarian cancer  . Elevated BP Transient 09/2014  . GERD (gastroesophageal reflux disease)   . H/O bone density study    2015  . H/O colonoscopy   . History of ovarian cancer    November 1979  . Insomnia   . Normal coronary arteries Cath - 09/2014  . Osteoporosis   . Raynaud's disease     Past Surgical History:  Procedure Laterality Date  . ABDOMINAL HYSTERECTOMY    . APPENDECTOMY    . BILATERAL SALPINGOOPHORECTOMY    . BREAST LUMPECTOMY    . CARDIAC CATHETERIZATION    . LEFT HEART CATHETERIZATION WITH CORONARY ANGIOGRAM N/A 10/17/2014   Procedure: LEFT HEART CATHETERIZATION WITH CORONARY ANGIOGRAM;  Surgeon: [name], MD;  Location: [facility] CATH LAB;  Service: Cardiovascular;  Laterality: N/A;  . ovarian ca s/p taj/bso  1979    Family History  Problem Relation Age of Onset  . Healthy Mother   . Healthy Brother    Social History:  reports that she has never smoked. She has never used smokeless tobacco. She reports that she does not drink alcohol or use drugs.  Allergies:  Allergies  Allergen Reactions  . Chlorhexidine Gluconate Rash    Medications Prior to Admission  Medication Sig Dispense Refill  . CALCIUM-MAG-VIT C-VIT D PO Take 15 mLs by mouth daily.     . cetirizine (ZYRTEC) 10 MG tablet Take 10 mg by mouth daily as needed for allergies.     Marland Kitchen NEXIUM 40 MG capsule TAKE 1 CAPSULE DAILY (Patient taking differently: TAKE 1 CAPSULE EVERY OTHER DAY) 90 capsule 3  . Omega-3 Fatty Acids (FISH OIL PO) Take 5 mLs by mouth daily.    . valACYclovir (VALTREX) 500 MG tablet Take 1 tablet (500 mg total) by mouth once a week. (Patient taking differently: Take 500 mg by mouth 2 (two) times a week. ) 12 tablet 3  . alendronate (FOSAMAX) 70 MG tablet Take 1 tablet (70 mg total) by mouth once a week. Take with a full glass of water on an empty stomach. (Patient not taking: Reported on 04/22/2017) 12 tablet 3  . traMADol (ULTRAM) 50 MG tablet Take 1 tablet (50 mg total) by mouth 2 (two) times daily as needed. (Patient not taking: Reported on 04/22/2017) 30 tablet 0    No results found for this or any previous visit (from the past 48 hour(s)). No results found.  ROS All negative  Blood pressure (!) 168/72, pulse 72, temperature 98.1 F (36.7 C), temperature source Oral, resp. rate 16, height 5\' 7"  (1.702 m), weight 64.1 kg (141 lb 4.8 oz), SpO2 100 %. Physical Exam  cv rrr pulm  clear bilaterally Ab soft Breast right breast with lateral incision and defect, no masses, no lad  Assessment/Plan Right total mastectomy, right axillary sn biopsy She is not really candidate for another lumpectomy  Kambrea Carrasco, MD 05/05/2017, 7:16 AM

## 2017-05-05 NOTE — Discharge Instructions (Signed)
[facility] [place] °[phone] ° °MASTECTOMY: POST OP INSTRUCTIONS ° °Always review your discharge instruction sheet given to you by the facility where your surgery was performed. °IF YOU HAVE DISABILITY OR FAMILY LEAVE FORMS, YOU MUST BRING THEM TO THE OFFICE FOR PROCESSING.   °DO NOT GIVE THEM TO YOUR DOCTOR. °A prescription for pain medication may be given to you upon discharge.  Take your pain medication as prescribed, if needed.  If narcotic pain medicine is not needed, then you may take acetaminophen (Tylenol), naprosyn (Alleve) or ibuprofen (Advil) as needed. °1. Take your usually prescribed medications unless otherwise directed. °2. If you need a refill on your pain medication, please contact your pharmacy.  They will contact our office to request authorization.  Prescriptions will not be filled after 5pm or on week-ends. °3. You should follow a light diet the first few days after arrival home, such as soup and crackers, etc.  Resume your normal diet the day after surgery. °4. Most patients will experience some swelling and bruising on the chest and underarm.  Ice packs will help.  Swelling and bruising can take several days to resolve. Wear the binder day and night until you return to the office.  °5. It is common to experience some constipation if taking pain medication after surgery.  Increasing fluid intake and taking a stool softener (such as Colace) will usually help or prevent this problem from occurring.  A mild laxative (Milk of Magnesia or Miralax) should be taken according to package instructions if there are no bowel movements after 48 hours. °6. Unless discharge instructions indicate otherwise, leave your bandage dry and in place until your next appointment in 3-5 days.  You may take a limited sponge bath.  No tube baths or showers until the drains are removed.  You may have steri-strips (small skin tapes) in place directly over the incision.  These strips should be left on the  skin for 7-10 days. If you have glue it will come off in next couple week.  Any sutures will be removed at an office visit °7. DRAINS:  If you have drains in place, it is important to keep a list of the amount of drainage produced each day in your drains.  Before leaving the hospital, you should be instructed on drain care.  Call our office if you have any questions about your drains. I will remove your drains when they put out less than 30 cc or ml for 2 consecutive days. °8. ACTIVITIES:  You may resume regular (light) daily activities beginning the next day--such as daily self-care, walking, climbing stairs--gradually increasing activities as tolerated.  You may have sexual intercourse when it is comfortable.  Refrain from any heavy lifting or straining until approved by your doctor. °a. You may drive when you are no longer taking prescription pain medication, you can comfortably wear a seatbelt, and you can safely maneuver your car and apply brakes. °b. RETURN TO WORK:  __________________________________________________________ °9. You should see your doctor in the office for a follow-up appointment approximately 3-5 days after your surgery.  Your doctor’s nurse will typically make your follow-up appointment when she calls you with your pathology report.  Expect your pathology report 3-4business days after surgery. °10. OTHER INSTRUCTIONS: ______________________________________________________________________________________________ ____________________________________________________________________________________________ °WHEN TO CALL YOUR DR Kennisha Qin: °1. Fever over 101.0 °2. Nausea and/or vomiting °3. Extreme swelling or bruising °4. Continued bleeding from incision. °5. Increased pain, redness, or drainage from the incision. °The clinic staff is available   to answer your questions during regular business hours.  Please don’t hesitate to call and ask to speak to one of the nurses for clinical concerns.  If  you have a medical emergency, go to the nearest emergency room or call 911.  A surgeon from [facility] is always on call at the hospital. °[address], [address], [place], [place]  [address] ? [address] [address], [place], [place]   [address] °[phone] ? [phone] ? FAX [phone] °Web site: [email or web address] ° °

## 2017-05-05 NOTE — Interval H&P Note (Signed)
History and Physical Interval Note:  05/05/2017 7:22 AM  Cindy Schwartz  has presented today for surgery, with the diagnosis of right breast cancer  The various methods of treatment have been discussed with the patient and family. After consideration of risks, benefits and other options for treatment, the patient has consented to  Procedure(s) with comments: RIGHT TOTAL MASTECTOMY WITH RIGHT  AXILLARY SENTINEL NODE BIOPSY (Right) - PECTORAL BLOCK as a surgical intervention .  The patient's history has been reviewed, patient examined, no change in status, stable for surgery.  I have reviewed the patient's chart and labs.  Questions were answered to the patient's satisfaction.     [name]

## 2017-05-05 NOTE — Anesthesia Procedure Notes (Signed)
Procedure Name: LMA Insertion Date/Time: 05/05/2017 7:40 AM Performed by: Izora Gala Pre-anesthesia Checklist: Patient identified, Emergency Drugs available, Suction available and Patient being monitored Patient Re-evaluated:Patient Re-evaluated prior to induction Oxygen Delivery Method: Circle system utilized Preoxygenation: Pre-oxygenation with 100% oxygen Induction Type: IV induction Ventilation: Mask ventilation without difficulty LMA: LMA inserted LMA Size: 4.0 Placement Confirmation: positive ETCO2 Tube secured with: Tape Dental Injury: Teeth and Oropharynx as per pre-operative assessment

## 2017-05-05 NOTE — Transfer of Care (Signed)
Immediate Anesthesia Transfer of Care Note  Patient: Cindy Schwartz  Procedure(s) Performed: Procedure(s) with comments: RIGHT TOTAL MASTECTOMY WITH RIGHT  AXILLARY SENTINEL NODE BIOPSY (Right) - PECTORAL BLOCK  Patient Location: PACU  Anesthesia Type:General and Regional  Level of Consciousness: awake, alert , oriented and patient cooperative  Airway & Oxygen Therapy: Patient Spontanous Breathing and Patient connected to nasal cannula oxygen  Post-op Assessment: Report given to RN and Post -op Vital signs reviewed and stable  Post vital signs: Reviewed and stable  Last Vitals:  Vitals:   05/05/17 0603  BP: (!) 168/72  Pulse: 72  Resp: 16  Temp: 36.7 C  SpO2: 100%    Last Pain:  Vitals:   05/05/17 0603  TempSrc: Oral      Patients Stated Pain Goal: 3 (73/56/70 1410)  Complications: No apparent anesthesia complications

## 2017-05-05 NOTE — Anesthesia Postprocedure Evaluation (Signed)
Anesthesia Post Note  Patient: Cindy Schwartz  Procedure(s) Performed: Procedure(s) (LRB): RIGHT TOTAL MASTECTOMY WITH RIGHT  AXILLARY SENTINEL NODE BIOPSY (Right)     Patient location during evaluation: PACU Anesthesia Type: General Level of consciousness: awake and alert Pain management: pain level controlled Vital Signs Assessment: post-procedure vital signs reviewed and stable Respiratory status: spontaneous breathing, nonlabored ventilation, respiratory function stable and patient connected to nasal cannula oxygen Cardiovascular status: blood pressure returned to baseline and stable Postop Assessment: no signs of nausea or vomiting Anesthetic complications: no    Last Vitals:  Vitals:   05/05/17 1008 05/05/17 1033  BP: 110/64 128/64  Pulse: 61 66  Resp: 11 12  Temp: 36.6 C 36.4 C  SpO2: 96% 100%    Last Pain:  Vitals:   05/05/17 1033  TempSrc: Oral                 Tiajuana Amass

## 2017-05-05 NOTE — Anesthesia Procedure Notes (Signed)
Anesthesia Regional Block: Pectoralis block   Pre-Anesthetic Checklist: ,, timeout performed, Correct Patient, Correct Site, Correct Laterality, Correct Procedure, Correct Position, site marked, Risks and benefits discussed,  Surgical consent,  Pre-op evaluation,  At surgeon's request and post-op pain management  Laterality: Right  Prep: chloraprep       Needles:  Injection technique: Single-shot  Needle Type: Echogenic Needle     Needle Length: 9cm  Needle Gauge: 21     Additional Needles:   Procedures: ultrasound guided,,,,,,,,  Narrative:  Start time: 05/05/2017 6:55 AM End time: 05/05/2017 7:02 AM Injection made incrementally with aspirations every 5 mL.  Performed by: Personally  Anesthesiologist: Suzette Battiest

## 2017-05-05 NOTE — Op Note (Signed)
Preoperative diagnosis: recurrent right breast dcis Postoperative diagnosis: saa Procedure: Righttotal mastectomy, right axillary sentinel node biopsy Surgeon Dr Serita Grammes EBL 25 cc Drains 119 Fr Blake drain to mastectomy space and axilla Complications none Specimens right mastectomy short superior long latera, right axillary nodes with highest count 869 Sponge and needle count correct times two Dispo to recovery stable  Indications: 33 yof who has recurrent DCIS one year after treatment.  We discussed treatment and agreed to right total mastectomy and right axillary sentinel node biopsy.   Procedure: After informed consent obtained patient was taken to the OR. She was given antibiotics and scds were in place. She had a pectoral block placed. She was placed under general anesthesia without complication. She was prepped and draped in standard sterile surgical fashion. A timeout was performed.  I made a large elliptical incision to encompass the nipple areola complex. This included the old incision. I then made flaps to the clavicle, parasternal area, inframammary fold and the latissimus.  I then removed the breast from the pectoralis muscle including the fascia.  This was marked as above and passed off the table as a specimen.  I then entered the axilla. There were two deep axillary nodes that were radioactive that I removed. I obtained hemostasis and then closed the deep space with 2-0 vicryl. I then obtained hemostasis and placed some arista in the wound.  I placed a 19 Fr Blake drain to the space and secured this with 2-0 nylon.  I closed the dermis throughout with 3-0 vicryl. Skin was closed with 4-0 monocryl.I then placed glue and steristrips. Binder was placed. She was extubated and transferred to recovery stable

## 2017-05-06 ENCOUNTER — Encounter (HOSPITAL_COMMUNITY): Payer: Self-pay | Admitting: General Surgery

## 2017-05-06 MED ORDER — TRAMADOL HCL 50 MG PO TABS: 50.0000 mg | ORAL_TABLET | Freq: Four times a day (QID) | ORAL | 0 refills | Status: DC | PRN

## 2017-05-06 NOTE — Discharge Summary (Signed)
Physician Discharge Summary  Patient ID: Cindy Schwartz MRN: [id number] DOB/AGE: Oct 22, 1933 83 y.o.  Admit date: 05/05/2017 Discharge date: 05/06/2017  Admission Diagnoses: Right breast dcis  Discharge Diagnoses:  Active Problems:   Breast cancer, right [facility])   Discharged Condition: good  Hospital Course: 69 yof s/p right mastectomy and sn biopsy for recurrent dcis. She is doing well with expected drain output and will be discharged home today  Consults: None  Significant Diagnostic Studies: none  Treatments: surgery: right tm/sn biopsy  Discharge Exam: Blood pressure 129/65, pulse 63, temperature 98.4 F (36.9 C), temperature source Oral, resp. rate 17, height 5\' 7"  (1.702 m), weight 69.9 kg (154 lb), SpO2 100 %. incision clean without infection, no hematoma, drain thin as expected  Disposition: 01-Home or Self Care   Allergies as of 05/06/2017      Reactions   Chlorhexidine Gluconate Rash      Medication List    TAKE these medications   alendronate 70 MG tablet Commonly known as:  FOSAMAX Take 1 tablet (70 mg total) by mouth once a week. Take with a full glass of water on an empty stomach.   CALCIUM-MAG-VIT C-VIT D PO Take 15 mLs by mouth daily.   cetirizine 10 MG tablet Commonly known as:  ZYRTEC Take 10 mg by mouth daily as needed for allergies.   FISH OIL PO Take 5 mLs by mouth daily.   NEXIUM 40 MG capsule Generic drug:  esomeprazole TAKE 1 CAPSULE DAILY What changed:  See the new instructions.   traMADol 50 MG tablet Commonly known as:  ULTRAM Take 1 tablet (50 mg total) by mouth 2 (two) times daily as needed. What changed:  Another medication with the same name was added. Make sure you understand how and when to take each.   traMADol 50 MG tablet Commonly known as:  ULTRAM Take 1 tablet (50 mg total) by mouth every 6 (six) hours as needed for moderate pain. What changed:  You were already taking a medication with the same name, and this  prescription was added. Make sure you understand how and when to take each.   valACYclovir 500 MG tablet Commonly known as:  VALTREX Take 1 tablet (500 mg total) by mouth once a week. What changed:  when to take this      Follow-up Information    [name], MD Follow up in 1 week(s).   Specialty:  General Surgery Contact information: [address] [place] [address] [phone]           Signed: [name] 05/06/2017, 8:16 AM

## 2017-06-16 ENCOUNTER — Ambulatory Visit: Payer: Medicare Other | Admitting: Physical Therapy

## 2017-06-23 ENCOUNTER — Encounter: Payer: Self-pay | Admitting: Genetic Counselor

## 2017-06-23 ENCOUNTER — Telehealth: Payer: Self-pay | Admitting: Genetic Counselor

## 2017-06-23 NOTE — Telephone Encounter (Signed)
Genetic counseling appt has been scheduled for the pt to see Ofri on 10/18 at 8am. Letter mailed to the pt.

## 2017-06-25 ENCOUNTER — Ambulatory Visit: Payer: Medicare Other | Attending: General Surgery | Admitting: Physical Therapy

## 2017-06-25 ENCOUNTER — Encounter: Payer: Self-pay | Admitting: Physical Therapy

## 2017-06-25 DIAGNOSIS — R293 Abnormal posture: Secondary | ICD-10-CM

## 2017-06-25 DIAGNOSIS — M25611 Stiffness of right shoulder, not elsewhere classified: Secondary | ICD-10-CM

## 2017-06-25 DIAGNOSIS — M6281 Muscle weakness (generalized): Secondary | ICD-10-CM | POA: Diagnosis present

## 2017-06-25 DIAGNOSIS — R600 Localized edema: Secondary | ICD-10-CM | POA: Diagnosis present

## 2017-06-25 NOTE — Patient Instructions (Signed)
[facility] [address] [place], [place] [address]         [phone]  Why exercise?  So many benefits! Here are SOME of them: 1. Heart health, including raising your good cholesterol level and reducing heart rate and blood pressure 2. Lung health, including improved lung capacity 3. It burns fats, and most of us can stand to be leaner, whether or not we are overweight. 4. It increases the body's natural painkillers and mood elevators, so makes you feel better. 5. Not only makes you feel better, but look better too 6. Improves sleep 7. Takes a bite out of stress 8. May decrease your risk of many types of cancer 9. If you are currently undergoing cancer treatment, exercise may improve your ability to tolerate treatments including chemotherapy. 10. For everybody, it can improve your energy level. Those with cancer-related fatigue report a 40-50% reduction in this symptom when exercising regularly. 11. If you are a survivor of breast, colon, or prostate cancer, it may decrease your risk of a recurrence. (This may hold for other cancers too, but so far we have data just for these three types.)  How to exercise: 1. Get your doctor's okay. 2. Pick something you enjoy doing, like walking, Zumba, biking, swimming, or whatever. 3. Start at low intensity and time, then gradually increase.  (See walking program handout.) 4. Set a goal to achieve over time.  The American Cancer Society, American Heart Association, and U.S. Dept. of Health and Human Services recommend 150 minutes of moderate exercise, 75 minutes of vigorous exercise, or a combination of both per week. This should be done in episodes at least 10 minutes long, spread throughout the week.  Need help being motivated? 1. Pick something you enjoy doing, because you'll be more inclined to stick with that activity than something that feels like a chore. 2. Do it with a friend so that you are accountable to each  other. 3. Schedule it into your day. Place it on your calendar and keep that appointment just like you do any appointment that you make. 4. Join an exercise group that meets at a specific time.  That way, you have to show up on time, and that makes it harder to procrastinate about doing your workout.  It also keeps you accountable-people begin to expect you to be there. 5. Join a gym where you feel comfortable and not intimidated, at the right cost. 6. Sign up for something that you'll need to be in shape for on a specific date, like a 1K or a 5K to walk or run, a 20 or 30 mile bike ride, a mud run or something like that. If the date is looming, you know you'll need to train to be ready for it.  An added benefit is that many of these are fundraisers for good causes. 7. If you've already paid for a gym membership, group exercise class or event, you might as well work out, so you haven't wasted your money!  

## 2017-06-25 NOTE — Therapy (Signed)
[place], [place], [address] Phone: [phone]   Fax:  [phone]  Physical Therapy Evaluation  Patient Details  Name: Cindy Schwartz MRN: [id number] Date of Birth: [birth year] Referring Provider: Dr. [name]  Encounter Date: 06/25/2017      PT End of Session - 06/25/17 1723    Visit Number 1   Number of Visits 9   Date for PT Re-Evaluation 07/23/17   PT Start Time 8099   PT Stop Time 1520   PT Time Calculation (min) 45 min   Activity Tolerance Patient tolerated treatment well   Behavior During Therapy [facility] for tasks assessed/performed      Past Medical History:  Diagnosis Date  . Aneurysm of ascending aorta ([place])    a. 4.2-4.3cm in 2014.  Marland Kitchen Anxiety   . Breast cancer ([place]) 2018   right mastectomy  . Cancer ([place]) 1980   ovarian cancer  . Elevated BP Transient 09/2014  . GERD (gastroesophageal reflux disease)   . H/O bone density study    2015  . H/O colonoscopy   . History of ovarian cancer    November 1979  . Insomnia   . Normal coronary arteries Cath - 09/2014  . Osteoporosis   . Raynaud's disease     Past Surgical History:  Procedure Laterality Date  . ABDOMINAL HYSTERECTOMY    . APPENDECTOMY    . BILATERAL SALPINGOOPHORECTOMY    . BREAST LUMPECTOMY    . CARDIAC CATHETERIZATION    . LEFT HEART CATHETERIZATION WITH CORONARY ANGIOGRAM N/A 10/17/2014   Procedure: LEFT HEART CATHETERIZATION WITH CORONARY ANGIOGRAM;  Surgeon: [name], MD;  Location: [facility] CATH LAB;  Service: Cardiovascular;  Laterality: N/A;  . MASTECTOMY W/ SENTINEL NODE BIOPSY Right 05/05/2017   total mastectomy  . ovarian ca s/p taj/bso  1979  . SIMPLE MASTECTOMY WITH AXILLARY SENTINEL NODE BIOPSY Right 05/05/2017   Procedure: RIGHT TOTAL MASTECTOMY WITH RIGHT  AXILLARY SENTINEL NODE BIOPSY;  Surgeon: [name], MD;  Location: [place];  Service: General;  Laterality: Right;  PECTORAL BLOCK    There  were no vitals filed for this visit.       Subjective Assessment - 06/25/17 1438    Subjective I am here to be evaluated. I am doing very well. I have some discomfort if I use my arm alot. I also have some tightness in my R chest.    Pertinent History R breast cancer s/p R mastectomy and SLNB on 05/05/17, hx of ovarian cancer   Patient Stated Goals pt does not really have a goal because she feels she is doing okay   Currently in Pain? No/denies   Pain Score 0-No pain            [facility] PT Assessment - 06/25/17 0001      Assessment   Medical Diagnosis right breast cancer   Referring Provider Dr. [name]   Onset Date/Surgical Date 05/05/17   Hand Dominance Right   Prior Therapy none     Precautions   Precautions Other (comment)   Precaution Comments at risk for lymphedema     Balance Screen   Has the patient fallen in the past 6 months No   Has the patient had a decrease in activity level because of a fear of falling?  No   Is the patient reluctant to leave their home because of a fear of falling?  No     Home Environment   Living  Environment Private residence   Living Arrangements Spouse/significant other   Available Help at Discharge Family   Type of [place] to enter   Entrance Stairs-Number of Steps 2   [place] Two level   Alternate Level Stairs-Number of Steps 14   Alternate Level Stairs-Rails Can reach both   Home Equipment None     Prior Function   Level of Independence Independent   Vocation Retired   Leisure pt reports she does not exercise- pt cooks 3 meals a day, does all housework in a large house     Cognition   Overall Cognitive Status Within Functional Limits for tasks assessed     Observation/Other Assessments   Observations possibly some very mild swelling in right lateral trunk     ROM / Strength   AROM / PROM / Strength AROM     AROM   AROM Assessment Site Shoulder   Right/Left  Shoulder Right;Left   Right Shoulder Flexion 145 Degrees   Right Shoulder ABduction 154 Degrees   Right Shoulder Internal Rotation 63 Degrees   Right Shoulder External Rotation 76 Degrees   Left Shoulder Flexion 154 Degrees   Left Shoulder ABduction 167 Degrees   Left Shoulder Internal Rotation 62 Degrees   Left Shoulder External Rotation 82 Degrees           LYMPHEDEMA/ONCOLOGY QUESTIONNAIRE - 06/25/17 1458      Type   Cancer Type right breast cancer     Surgeries   Mastectomy Date 05/05/17   Sentinel Lymph Node Biopsy Date 05/05/17   Number Lymph Nodes Removed 2     Treatment   Active Chemotherapy Treatment No   Past Chemotherapy Treatment No   Active Radiation Treatment No   Past Radiation Treatment No   Current Hormone Treatment No   Past Hormone Therapy No     What other symptoms do you have   Are you Having Heaviness or Tightness Yes  tightness in right axilla at end range   Are you having Pain No   Are you having pitting edema No   Is it Hard or Difficult finding clothes that fit No   Do you have infections No   Is there Decreased scar mobility Yes  a little tightness at distal end     Lymphedema Assessments   Lymphedema Assessments Upper extremities     Right Upper Extremity Lymphedema   15 cm Proximal to Olecranon Process 26 cm   Olecranon Process 24.6 cm   15 cm Proximal to Ulnar Styloid Process 23 cm   Just Proximal to Ulnar Styloid Process 16.5 cm   Across Hand at [employer] 20 cm   At [place] of 2nd Digit 7.5 cm     Left Upper Extremity Lymphedema   15 cm Proximal to Olecranon Process 26.8 cm   Olecranon Process 24.5 cm   15 cm Proximal to Ulnar Styloid Process 22.8 cm   Just Proximal to Ulnar Styloid Process 16.5 cm   Across Hand at [employer] 20 cm   At [place] of 2nd Digit 6.9 cm           Quick Dash - 06/25/17 0001    Open a tight or new jar Mild difficulty   Do heavy household chores (wash walls, wash floors) Mild difficulty    Carry a shopping bag or briefcase No difficulty   Wash your back No difficulty  Use a knife to cut food Mild difficulty   Recreational activities in which you take some force or impact through your arm, shoulder, or hand (golf, hammering, tennis) Severe difficulty   During the past week, to what extent has your arm, shoulder or hand problem interfered with your normal social activities with family, friends, neighbors, or groups? Not at all   During the past week, to what extent has your arm, shoulder or hand problem limited your work or other regular daily activities Slightly   Arm, shoulder, or hand pain. None   Tingling (pins and needles) in your arm, shoulder, or hand None   Difficulty Sleeping No difficulty   DASH Score 15.91 %      Objective measurements completed on examination: See above findings.                  PT Education - 06/25/17 1723    Education provided Yes   Education Details anatomy and physiology of lymphatic system, lymphedema risk reduction practices   Person(s) Educated Patient   Methods Explanation;Handout   Comprehension Verbalized understanding          [facility] Goals - 06/25/17 1729      CC Long Term Goal  #1   Title Pt will demonstrate 160 degrees of right shoulder flexion to allow her to reach overhead   Baseline 145   Time 4   Period Weeks   Status New   Target Date 07/23/17     CC Long Term Goal  #2   Title Pt will demonstrate 165 degrees of right shoulder abduction to allow her to reach out to sides   Baseline 154   Time 4   Period Weeks   Status New   Target Date 07/23/17     CC Long Term Goal  #3   Title Pt will report  no tightness in R chest with R UE movement to allow improved comfort.   Time 4   Period Weeks   Status New   Target Date 07/23/17     CC Long Term Goal  #4   Title Pt will be able to independently verbalize lymphedema risk reduction practices   Time 4   Period Weeks   Status New    Target Date 07/23/17     CC Long Term Goal  #5   Title Pt will be independent in a home exercise program for continued strengthening and stretching   Time 4   Period Weeks   Status New             Plan - 06/25/17 1724    Clinical Impression Statement Pt presents to PT with tightness across right chest during end range and decreased right shoulder ROM. Pt underwent a right mastectomy and SLNB on 05/05/17. She does not require any further treatment. She would benefit from skilled PT services to try and decrease possible swelling in right trunk, increase right shoulder ROM and decrease R pec tightness as well as instruct pt in a home exercise program that she can complete at home.    Rehab Potential Excellent   PT Frequency 2x / week   PT Duration 4 weeks   PT Treatment/Interventions ADLs/Self Care Home Management;Therapeutic exercise;Therapeutic activities;Patient/family education;Orthotic Fit/Training;Manual techniques;Manual lymph drainage;Compression bandaging;Passive range of motion;Scar mobilization;Taping   PT Next Visit Plan begin AA/P/AROM exercises for R shoulder, MLD to R trunk, R pec stretch, Strength ABC program   Consulted and Agree with Plan of Care  Patient      Patient will benefit from skilled therapeutic intervention in order to improve the following deficits and impairments:  Pain, Decreased range of motion, Decreased strength, Increased edema, Decreased knowledge of precautions, Increased fascial restricitons, Postural dysfunction  Visit Diagnosis: Stiffness of right shoulder, not elsewhere classified - Plan: PT plan of care cert/re-cert  Muscle weakness (generalized) - Plan: PT plan of care cert/re-cert  Abnormal posture - Plan: PT plan of care cert/re-cert  Localized edema - Plan: PT plan of care cert/re-cert      G-Codes - 93/81/82 1732    Functional Assessment Tool Used (Outpatient Only) Quick Dash   Functional Limitation Carrying, moving and handling  objects   Carrying, Moving and Handling Objects Current Status (X9371) At least 1 percent but less than 20 percent impaired, limited or restricted   Carrying, Moving and Handling Objects Goal Status (I9678) 0 percent impaired, limited or restricted       Problem List Patient Active Problem List   Diagnosis Date Noted  . Breast cancer, right ([place]) 05/05/2017  . Closed compression fracture of L1 lumbar vertebra ([place]) 03/18/2017  . Breast cancer of lower-outer quadrant of right female breast ([place]) 01/16/2016  . Palpitations 11/13/2014  . Normal coronary arteries 10/17/2014  . Chest pain   . Varicose veins of lower extremities with other complications 93/81/0175  . Hx of herpes simplex infection 12/18/2011  . COMPRESSION FRACTURE, THORACIC VERTEBRA 07/24/2010  . BACK PAIN, LUMBAR 06/28/2010  . NECK AND BACK PAIN 01/18/2010  . OSTEOARTHROSIS UNSPEC WHETHER GEN/LOC LOWER LEG 09/05/2009  . [place] REGION 02/02/2009  . GENERALIZED ANXIETY DISORDER 10/13/2008  . IRRITABLE BOWEL SYNDROME 10/13/2008  . HERPES SIMPLEX INFECTION 08/24/2008  . LUNG NODULE 11/25/2007  . Raynaud's syndrome 07/01/2007  . Aneurysm of thoracic aorta ([place]) 05/06/2007  . Allergic rhinitis 05/06/2007  . Esophageal reflux 05/06/2007  . DEGENERATIVE DISC DISEASE, CERVICAL SPINE 05/06/2007  . Osteoporosis 03/18/2007    Allyson Sabal [facility] 06/25/2017, 5:37 PM  [place], [place], [address] Phone: [phone]   Fax:  [phone]  Name: Velta Rockholt MRN: [id number] Date of Birth: 08-Jan-1934  Manus Gunning, PT 06/25/17 5:38 PM

## 2017-07-02 ENCOUNTER — Ambulatory Visit: Payer: Medicare Other | Admitting: Physical Therapy

## 2017-07-03 ENCOUNTER — Ambulatory Visit: Payer: Medicare Other | Admitting: Physical Therapy

## 2017-07-03 ENCOUNTER — Encounter: Payer: Self-pay | Admitting: Physical Therapy

## 2017-07-03 DIAGNOSIS — R600 Localized edema: Secondary | ICD-10-CM

## 2017-07-03 DIAGNOSIS — M25611 Stiffness of right shoulder, not elsewhere classified: Secondary | ICD-10-CM

## 2017-07-03 NOTE — Therapy (Signed)
[place], [place], [address] Phone: [phone]   Fax:  [phone]  Physical Therapy Treatment  Patient Details  Name: Cindy Schwartz MRN: [id number] Date of Birth: 06-03-1934 Referring Provider: Dr. [name]  Encounter Date: 07/03/2017      PT End of Session - 07/03/17 1036    Visit Number 2   Number of Visits 9   Date for PT Re-Evaluation 07/23/17   PT Start Time 0848   PT Stop Time 0935   PT Time Calculation (min) 47 min   Activity Tolerance Patient tolerated treatment well   Behavior During Therapy [facility] for tasks assessed/performed      Past Medical History:  Diagnosis Date  . Aneurysm of ascending aorta ([place])    a. 4.2-4.3cm in 2014.  Marland Kitchen Anxiety   . Breast cancer ([place]) 2018   right mastectomy  . Cancer ([place]) 1980   ovarian cancer  . Elevated BP Transient 09/2014  . GERD (gastroesophageal reflux disease)   . H/O bone density study    2015  . H/O colonoscopy   . History of ovarian cancer    November 1979  . Insomnia   . Normal coronary arteries Cath - 09/2014  . Osteoporosis   . Raynaud's disease     Past Surgical History:  Procedure Laterality Date  . ABDOMINAL HYSTERECTOMY    . APPENDECTOMY    . BILATERAL SALPINGOOPHORECTOMY    . BREAST LUMPECTOMY    . CARDIAC CATHETERIZATION    . LEFT HEART CATHETERIZATION WITH CORONARY ANGIOGRAM N/A 10/17/2014   Procedure: LEFT HEART CATHETERIZATION WITH CORONARY ANGIOGRAM;  Surgeon: [name], MD;  Location: [facility] CATH LAB;  Service: Cardiovascular;  Laterality: N/A;  . MASTECTOMY W/ SENTINEL NODE BIOPSY Right 05/05/2017   total mastectomy  . ovarian ca s/p taj/bso  1979  . SIMPLE MASTECTOMY WITH AXILLARY SENTINEL NODE BIOPSY Right 05/05/2017   Procedure: RIGHT TOTAL MASTECTOMY WITH RIGHT  AXILLARY SENTINEL NODE BIOPSY;  Surgeon: [name], MD;  Location: [place];  Service: General;  Laterality: Right;  PECTORAL BLOCK    There  were no vitals filed for this visit.      Subjective Assessment - 07/03/17 0851    Subjective I am still not sure if I am having swelling or if it is supposed to look like this. I am still having tightness. Yesterday I felt discomfort across my chest.    Pertinent History R breast cancer s/p R mastectomy and SLNB on 05/05/17, hx of ovarian cancer   Patient Stated Goals pt does not really have a goal because she feels she is doing okay   Currently in Pain? No/denies   Pain Score 0-No pain                         [facility] Adult PT Treatment/Exercise - 07/03/17 0001      Shoulder Exercises: Standing   Other Standing Exercises pec stretch at corner with R shoulder blocked by corner of wall, R shoulder abducted to 90 and elbow bent to 90 reaching arm up wall until a stretch is felt in pec - 3 reps x 30 sec     Manual Therapy   Manual Therapy Passive ROM;Myofascial release;Manual Lymphatic Drainage (MLD);Edema management   Edema Management created foam chip pack for pt to wear in her bra to help decrease lateral trunk swelling   Myofascial Release pulling R shoulder into abduction to pt's tolerance  Manual Lymphatic Drainage (MLD) short neck, superficial and deep abdominals, left axillary nodes and establishment in interaxillary pathway, right inguinal nodes and establisment of axillo inguinal pathway, focused on drainage of right lateral trunk, then to left sidelying and establishment of posterior interaxillary pathway, back to supine and finished with anterior pathways   Passive ROM to right shoulder in direction of flexion and abduction               [facility] Goals - 06/25/17 1729      CC Long Term Goal  #1   Title Pt will demonstrate 160 degrees of right shoulder flexion to allow her to reach overhead   Baseline 145   Time 4   Period Weeks   Status New   Target Date 07/23/17     CC Long Term Goal  #2   Title Pt will demonstrate 165 degrees of right  shoulder abduction to allow her to reach out to sides   Baseline 154   Time 4   Period Weeks   Status New   Target Date 07/23/17     CC Long Term Goal  #3   Title Pt will report  no tightness in R chest with R UE movement to allow improved comfort.   Time 4   Period Weeks   Status New   Target Date 07/23/17     CC Long Term Goal  #4   Title Pt will be able to independently verbalize lymphedema risk reduction practices   Time 4   Period Weeks   Status New   Target Date 07/23/17     CC Long Term Goal  #5   Title Pt will be independent in a home exercise program for continued strengthening and stretching   Time 4   Period Weeks   Status New            Plan - 07/03/17 1037    Clinical Impression Statement Began PROM to R shoulder with myofascial pulling to help decrease right pec tightness. Instructed pt in pec stretch on right. Began MLD to R trunk and issued chip pack for pt to wear in her bra along right trunk to see if she notices a decrease in fullness in this area.    Rehab Potential Excellent   PT Frequency 2x / week   PT Duration 4 weeks   PT Treatment/Interventions ADLs/Self Care Home Management;Therapeutic exercise;Therapeutic activities;Patient/family education;Orthotic Fit/Training;Manual techniques;Manual lymph drainage;Compression bandaging;Passive range of motion;Scar mobilization;Taping   PT Next Visit Plan  AA/P/AROM exercises for R shoulder, MLD to R trunk, R pec stretch, Strength ABC program   Consulted and Agree with Plan of Care Patient      Patient will benefit from skilled therapeutic intervention in order to improve the following deficits and impairments:  Pain, Decreased range of motion, Decreased strength, Increased edema, Decreased knowledge of precautions, Increased fascial restricitons, Postural dysfunction  Visit Diagnosis: Stiffness of right shoulder, not elsewhere classified  Localized edema     Problem List Patient Active Problem  List   Diagnosis Date Noted  . Breast cancer, right ([place]) 05/05/2017  . Closed compression fracture of L1 lumbar vertebra ([place]) 03/18/2017  . Breast cancer of lower-outer quadrant of right female breast ([place]) 01/16/2016  . Palpitations 11/13/2014  . Normal coronary arteries 10/17/2014  . Chest pain   . Varicose veins of lower extremities with other complications 11/94/1740  . Hx of herpes simplex infection 12/18/2011  . COMPRESSION FRACTURE, THORACIC VERTEBRA 07/24/2010  .  BACK PAIN, LUMBAR 06/28/2010  . NECK AND BACK PAIN 01/18/2010  . OSTEOARTHROSIS UNSPEC WHETHER GEN/LOC LOWER LEG 09/05/2009  . [place] REGION 02/02/2009  . GENERALIZED ANXIETY DISORDER 10/13/2008  . IRRITABLE BOWEL SYNDROME 10/13/2008  . HERPES SIMPLEX INFECTION 08/24/2008  . LUNG NODULE 11/25/2007  . Raynaud's syndrome 07/01/2007  . Aneurysm of thoracic aorta ([place]) 05/06/2007  . Allergic rhinitis 05/06/2007  . Esophageal reflux 05/06/2007  . DEGENERATIVE DISC DISEASE, CERVICAL SPINE 05/06/2007  . Osteoporosis 03/18/2007    Allyson Sabal [facility] 07/03/2017, 10:39 AM  [place], [place], [address] Phone: [phone]   Fax:  [phone]  Name: Cindy Schwartz MRN: [id number] Date of Birth: 10-07-1933  Manus Gunning, PT 07/03/17 10:40 AM

## 2017-07-08 ENCOUNTER — Encounter: Payer: Self-pay | Admitting: Physical Therapy

## 2017-07-08 ENCOUNTER — Ambulatory Visit: Payer: Medicare Other | Admitting: Physical Therapy

## 2017-07-08 DIAGNOSIS — M25611 Stiffness of right shoulder, not elsewhere classified: Secondary | ICD-10-CM | POA: Diagnosis not present

## 2017-07-08 DIAGNOSIS — R600 Localized edema: Secondary | ICD-10-CM

## 2017-07-08 NOTE — Therapy (Signed)
[place], [place], [address] Phone: [phone]   Fax:  [phone]  Physical Therapy Treatment  Patient Details  Name: Cindy Schwartz MRN: [id number] Date of Birth: September 08, 1934 Referring Provider: Dr. [name]  Encounter Date: 07/08/2017      PT End of Session - 07/08/17 1541    Visit Number 3   Number of Visits 9   Date for PT Re-Evaluation 07/23/17   PT Start Time 1301   PT Stop Time 1350   PT Time Calculation (min) 49 min   Activity Tolerance Patient tolerated treatment well   Behavior During Therapy [facility] for tasks assessed/performed      Past Medical History:  Diagnosis Date  . Aneurysm of ascending aorta ([place])    a. 4.2-4.3cm in 2014.  Marland Kitchen Anxiety   . Breast cancer ([place]) 2018   right mastectomy  . Cancer ([place]) 1980   ovarian cancer  . Elevated BP Transient 09/2014  . GERD (gastroesophageal reflux disease)   . H/O bone density study    2015  . H/O colonoscopy   . History of ovarian cancer    November 1979  . Insomnia   . Normal coronary arteries Cath - 09/2014  . Osteoporosis   . Raynaud's disease     Past Surgical History:  Procedure Laterality Date  . ABDOMINAL HYSTERECTOMY    . APPENDECTOMY    . BILATERAL SALPINGOOPHORECTOMY    . BREAST LUMPECTOMY    . CARDIAC CATHETERIZATION    . LEFT HEART CATHETERIZATION WITH CORONARY ANGIOGRAM N/A 10/17/2014   Procedure: LEFT HEART CATHETERIZATION WITH CORONARY ANGIOGRAM;  Surgeon: [name], MD;  Location: [facility] CATH LAB;  Service: Cardiovascular;  Laterality: N/A;  . MASTECTOMY W/ SENTINEL NODE BIOPSY Right 05/05/2017   total mastectomy  . ovarian ca s/p taj/bso  1979  . SIMPLE MASTECTOMY WITH AXILLARY SENTINEL NODE BIOPSY Right 05/05/2017   Procedure: RIGHT TOTAL MASTECTOMY WITH RIGHT  AXILLARY SENTINEL NODE BIOPSY;  Surgeon: [name], MD;  Location: [facility];  Service: General;  Laterality: Right;  PECTORAL BLOCK    There  were no vitals filed for this visit.      Subjective Assessment - 07/08/17 1304    Subjective I have been doing the pec stretch. I thought I was only supposed to wear the foam chip pack for one day.    Pertinent History R breast cancer s/p R mastectomy and SLNB on 05/05/17, hx of ovarian cancer   Patient Stated Goals pt does not really have a goal because she feels she is doing okay   Currently in Pain? No/denies   Pain Score 0-No pain            [facility] PT Assessment - 07/08/17 0001      AROM   Right Shoulder Flexion 155 Degrees   Right Shoulder ABduction 163 Degrees                     [facility] Adult PT Treatment/Exercise - 07/08/17 0001      Manual Therapy   Myofascial Release pulling R shoulder into abduction to pt's tolerance   Manual Lymphatic Drainage (MLD) short neck, superficial and deep abdominals, left axillary nodes and establishment in interaxillary pathway, right inguinal nodes and establisment of axillo inguinal pathway, focused on drainage of right lateral trunk, then to left sidelying and establishment of posterior interaxillary pathway, back to supine and finished with anterior pathways   Passive ROM to right  shoulder in direction of flexion and abduction                 Long Term Clinic Goals - 07/08/17 1347      CC Long Term Goal  #1   Title Pt will demonstrate 160 degrees of right shoulder flexion to allow her to reach overhead   Baseline 145, 07/08/17- 155 degrees   Time 4   Period Weeks   Status On-going     CC Long Term Goal  #2   Title Pt will demonstrate 165 degrees of right shoulder abduction to allow her to reach out to sides   Baseline 154, 07/08/17- 163 degrees   Time 4   Period Weeks   Status On-going     CC Long Term Goal  #3   Title Pt will report  no tightness in R chest with R UE movement to allow improved comfort.   Baseline 07/08/17- pt reports she is still having tightness with this   Time 4   Period Weeks    Status On-going     CC Long Term Goal  #4   Title Pt will be able to independently verbalize lymphedema risk reduction practices   Baseline 07/08/17- pt required cueing for this   Time 4   Period Weeks   Status On-going     CC Long Term Goal  #5   Title Pt will be independent in a home exercise program for continued strengthening and stretching   Time 4   Period Weeks   Status On-going            Plan - 07/08/17 1541    Clinical Impression Statement Assessed pt's progress towards goals in therapy. She has almost met her ROM goals for her right shoulder but is still having tightness in her right pec. She also is still having some possible swelling in right trunk. Educated pt to wear chip pack more frequently to see if this helps alleviate swelling in right trunk. Will begin educating pt in Strength ABC program at next session.    Rehab Potential Excellent   PT Frequency 2x / week   PT Duration 4 weeks   PT Treatment/Interventions ADLs/Self Care Home Management;Therapeutic exercise;Therapeutic activities;Patient/family education;Orthotic Fit/Training;Manual techniques;Manual lymph drainage;Compression bandaging;Passive range of motion;Scar mobilization;Taping   PT Next Visit Plan  AA/P/AROM exercises for R shoulder, MLD to R trunk, R pec stretch, Strength ABC program   Consulted and Agree with Plan of Care Patient      Patient will benefit from skilled therapeutic intervention in order to improve the following deficits and impairments:  Pain, Decreased range of motion, Decreased strength, Increased edema, Decreased knowledge of precautions, Increased fascial restricitons, Postural dysfunction  Visit Diagnosis: Stiffness of right shoulder, not elsewhere classified  Localized edema     Problem List Patient Active Problem List   Diagnosis Date Noted  . Breast cancer, right ([place]) 05/05/2017  . Closed compression fracture of L1 lumbar vertebra ([place]) 03/18/2017  . Breast cancer  of lower-outer quadrant of right female breast ([place]) 01/16/2016  . Palpitations 11/13/2014  . Normal coronary arteries 10/17/2014  . Chest pain   . Varicose veins of lower extremities with other complications 44/31/5400  . Hx of herpes simplex infection 12/18/2011  . COMPRESSION FRACTURE, THORACIC VERTEBRA 07/24/2010  . BACK PAIN, LUMBAR 06/28/2010  . NECK AND BACK PAIN 01/18/2010  . OSTEOARTHROSIS UNSPEC WHETHER GEN/LOC LOWER LEG 09/05/2009  . [place] REGION 02/02/2009  .  GENERALIZED ANXIETY DISORDER 10/13/2008  . IRRITABLE BOWEL SYNDROME 10/13/2008  . HERPES SIMPLEX INFECTION 08/24/2008  . LUNG NODULE 11/25/2007  . Raynaud's syndrome 07/01/2007  . Aneurysm of thoracic aorta ([place]) 05/06/2007  . Allergic rhinitis 05/06/2007  . Esophageal reflux 05/06/2007  . DEGENERATIVE DISC DISEASE, CERVICAL SPINE 05/06/2007  . Osteoporosis 03/18/2007    Allyson Sabal [facility] 07/08/2017, 3:43 PM  [place], [place], [address] Phone: [phone]   Fax:  [phone]  Name: Cindy Schwartz MRN: [id number] Date of Birth: [birth year]  Manus Gunning, PT 07/08/17 3:44 PM

## 2017-07-09 ENCOUNTER — Other Ambulatory Visit: Payer: Medicare Other

## 2017-07-09 ENCOUNTER — Encounter: Payer: Self-pay | Admitting: Genetic Counselor

## 2017-07-09 ENCOUNTER — Ambulatory Visit (HOSPITAL_BASED_OUTPATIENT_CLINIC_OR_DEPARTMENT_OTHER): Payer: Medicare Other | Admitting: Genetic Counselor

## 2017-07-09 DIAGNOSIS — D0511 Intraductal carcinoma in situ of right breast: Secondary | ICD-10-CM

## 2017-07-09 DIAGNOSIS — Z803 Family history of malignant neoplasm of breast: Secondary | ICD-10-CM | POA: Diagnosis not present

## 2017-07-09 DIAGNOSIS — Z808 Family history of malignant neoplasm of other organs or systems: Secondary | ICD-10-CM

## 2017-07-09 DIAGNOSIS — Z1379 Encounter for other screening for genetic and chromosomal anomalies: Secondary | ICD-10-CM

## 2017-07-09 DIAGNOSIS — Z8543 Personal history of malignant neoplasm of ovary: Secondary | ICD-10-CM

## 2017-07-09 DIAGNOSIS — Z315 Encounter for genetic counseling: Secondary | ICD-10-CM | POA: Diagnosis not present

## 2017-07-09 HISTORY — DX: Encounter for other screening for genetic and chromosomal anomalies: Z13.79

## 2017-07-09 NOTE — Progress Notes (Signed)
[facility]      Initial Visit   Patient Name: Cindy Schwartz Patient DOB: July 20, 1934 Patient Age: 83 y.o. Encounter Date: 07/09/2017  Referring Provider: Nicholas Lose, MD [name], MD  Primary Care Provider: Marletta Lor, MD  Reason for Visit: Evaluate for hereditary susceptibility to cancer    Assessment and Plan:  . Cindy Schwartz's personal history of both ovarian and breast cancers is somewhat suggestive of a hereditary predisposition to cancer even without a positive family history. Her father had no sisters and this paucity of women makes risk assessment challenging.   . Testing is recommended to determine whether she has a pathogenic mutation that will impact her screening and risk-reduction for cancer. A negative result will be reassuring.  . Cindy Schwartz wished to pursue genetic testing and a blood sample will be sent for analysis of the 83 genes on [facility]'s Multi-Cancer panel (ALK, APC, ATM, AXIN2, BAP1, BARD1, BLM, BMPR1A, BRCA1, BRCA2, BRIP1, CASR, CDC73, CDH1, CDK4, CDKN1B, CDKN1C, CDKN2A, CEBPA, CHEK2, CTNNA1, DICER1, DIS3L2, EGFR, EPCAM, FH, FLCN, GATA2, GPC3, GREM1, HOXB13, HRAS, KIT, MAX, MEN1, MET, MITF, MLH1, MSH2, MSH3, MSH6, MUTYH, NBN, NF1, NF2, NTHL1, PALB2, PDGFRA, PHOX2B, PMS2, POLD1, POLE, POT1, PRKAR1A, PTCH1, PTEN, RAD50, RAD51C, RAD51D, RB1, RECQL4, RET, RUNX1, SDHA, SDHAF2, SDHB, SDHC, SDHD, SMAD4, SMARCA4, SMARCB1, SMARCE1, STK11, SUFU, TERC, TERT, TMEM127, TP53, TSC1, TSC2, VHL, WRN, WT1).   . Results should be available in approximately 2-4 weeks, at which point we will contact her and address implications for her as well as address genetic testing for at-risk family members, if needed.     Dr. Lindi Adie was available for questions concerning this case. Total time spent by me in face-to-face counseling was approximately 30 minutes.   _____________________________________________________________________   History of  Present Illness: Cindy Schwartz, a 83 y.o. female, is being seen at the [facility] due to a personal history of breast and ovarian cancers. She presents to clinic today to discuss the possibility of a hereditary predisposition to cancer and discuss whether genetic testing is warranted.  [name] was diagnosed with breast cancer at the age of 30. She  Has a history of ovarian cancer at age 9.    Breast cancer of lower-outer quadrant of right female breast ([place])   12/28/2015 Initial Diagnosis    Right breast needle biopsy 8:00: DCIS with calcifications, high-grade, ER 0%, PR 0%, Tis N0 stage 0      01/14/2016 Breast MRI    Right breast lesion 2.7 x 2.4 x 2.2 cm; initial mammogram and ultrasound revealed 2.4 cm irregular hypoechoic mass at 8:00 position      02/03/2017 Surgery    Right lumpectomy at [facility]: DCIS grade 3, solid, cribriform and micropapillary type with comedonecrosis 3.3 cm, anterior margin positive Tis Nx stage 0      02/23/2017 Relapse/Recurrence    [facility]: Pleomorphic calcifications in the right breast along prior lumpectomy site: DCIS high-grade micropapillary features ER 0%, PR 0%        Past Medical History:  Diagnosis Date  . Aneurysm of ascending aorta ([place])    a. 4.2-4.3cm in 2014.  Marland Kitchen Anxiety   . Breast cancer ([place]) 2018   right mastectomy  . Cancer ([place]) 1980   ovarian cancer  . Elevated BP Transient 09/2014  . GERD (gastroesophageal reflux disease)   . H/O bone density study    2015  . H/O colonoscopy   .  History of ovarian cancer    November 1979  . 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 Insomnia   . Normal coronary arteries Cath - 09/2014  . Osteoporosis   . Raynaud's disease     Past Surgical History:  Procedure Laterality Date  . ABDOMINAL HYSTERECTOMY    . APPENDECTOMY    . BILATERAL SALPINGOOPHORECTOMY    . BREAST LUMPECTOMY    . CARDIAC CATHETERIZATION    .  LEFT HEART CATHETERIZATION WITH CORONARY ANGIOGRAM N/A 10/17/2014   Procedure: LEFT HEART CATHETERIZATION WITH CORONARY ANGIOGRAM;  Surgeon: [name], MD;  Location: [facility] CATH LAB;  Service: Cardiovascular;  Laterality: N/A;  . MASTECTOMY W/ SENTINEL NODE BIOPSY Right 05/05/2017   total mastectomy  . ovarian ca s/p taj/bso  1979  . SIMPLE MASTECTOMY WITH AXILLARY SENTINEL NODE BIOPSY Right 05/05/2017   Procedure: RIGHT TOTAL MASTECTOMY WITH RIGHT  AXILLARY SENTINEL NODE BIOPSY;  Surgeon: [name], MD;  Location: [facility];  Service: General;  Laterality: Right;  PECTORAL BLOCK    Social History   Social History  . Marital status: Married    Spouse name: Publishing copy  . Number of children: 2  . Years of education: N/A   Occupational History  . retired Retired   Social History Main Topics  . Smoking status: Never Smoker  . Smokeless tobacco: Never Used  . Alcohol use No  . Drug use: No  . Sexual activity: Not on file   Other Topics Concern  . Not on file   Social History Narrative   She and her husband are from the [place] of [place].`   Children: Cindy Schwartz age 83, lives in [place], Cindy Schwartz, age 38, lives in [place], [place]     Family History:  During the visit, a 4-generation pedigree was obtained. Family tree will be scanned in the Media tab in Epic  Significant diagnoses include the following:  Family History  Problem Relation Age of Onset  . Healthy Mother   . Healthy Brother   . Breast cancer Cousin 1       mat first cousin related through uncle  . Esophageal cancer Cousin 53       mat first cousin related through uncle    Additionally, [name] has a son (age 57) and a daughter (age 67). Her mother died at 72 and had only 2 brothers. Her father died at 59 and had only 3 brothers.  Ms. Guidone ancestry is [place]. There is no known Jewish ancestry and no consanguinity.  Discussion: We reviewed the characteristics, features and  inheritance patterns of hereditary cancer syndromes. We discussed her risk of harboring a mutation in the context of her personal and family history. We discussed that the paucity of women in her father's family makes risk assessment challenging. We discussed the process of genetic testing, insurance coverage and implications of results: positive, negative and variant of unknown significance (VUS).    Ms. Caratachea questions were answered to her satisfaction today and she is welcome to call with any additional questions or concerns. Thank you for the referral and allowing [place] to share in the care of your patient.    Steele Berg, MS, [place] Certified Genetic Counselor phone: [phone] [email or web address]   ______________________________________________________________________ For Office Staff:  Number of people involved in session: 1 Was an Intern/ student involved with case: no

## 2017-07-15 ENCOUNTER — Ambulatory Visit (INDEPENDENT_AMBULATORY_CARE_PROVIDER_SITE_OTHER): Payer: Medicare Other | Admitting: Internal Medicine

## 2017-07-15 ENCOUNTER — Ambulatory Visit: Payer: Medicare Other | Admitting: Physical Therapy

## 2017-07-15 ENCOUNTER — Encounter: Payer: Self-pay | Admitting: Internal Medicine

## 2017-07-15 ENCOUNTER — Encounter: Payer: Self-pay | Admitting: Physical Therapy

## 2017-07-15 VITALS — BP 128/70 | HR 75 | Temp 97.6°F | Ht 67.0 in | Wt 142.8 lb

## 2017-07-15 DIAGNOSIS — M6281 Muscle weakness (generalized): Secondary | ICD-10-CM

## 2017-07-15 DIAGNOSIS — M25611 Stiffness of right shoulder, not elsewhere classified: Secondary | ICD-10-CM

## 2017-07-15 DIAGNOSIS — E785 Hyperlipidemia, unspecified: Secondary | ICD-10-CM | POA: Diagnosis not present

## 2017-07-15 DIAGNOSIS — M818 Other osteoporosis without current pathological fracture: Secondary | ICD-10-CM | POA: Diagnosis not present

## 2017-07-15 DIAGNOSIS — K219 Gastro-esophageal reflux disease without esophagitis: Secondary | ICD-10-CM | POA: Diagnosis not present

## 2017-07-15 DIAGNOSIS — I7300 Raynaud's syndrome without gangrene: Secondary | ICD-10-CM | POA: Diagnosis not present

## 2017-07-15 DIAGNOSIS — J301 Allergic rhinitis due to pollen: Secondary | ICD-10-CM | POA: Diagnosis not present

## 2017-07-15 DIAGNOSIS — Z23 Encounter for immunization: Secondary | ICD-10-CM

## 2017-07-15 DIAGNOSIS — Z8543 Personal history of malignant neoplasm of ovary: Secondary | ICD-10-CM

## 2017-07-15 DIAGNOSIS — R293 Abnormal posture: Secondary | ICD-10-CM

## 2017-07-15 DIAGNOSIS — Z0000 Encounter for general adult medical examination without abnormal findings: Secondary | ICD-10-CM | POA: Diagnosis not present

## 2017-07-15 LAB — LIPID PANEL
CHOL/HDL RATIO: 3
Cholesterol: 193 mg/dL (ref 0–200)
HDL: 70.2 mg/dL (ref >39.00)
LDL CALC: 113 mg/dL — AB (ref 0–99)
NONHDL: 123.21
Triglycerides: 50 mg/dL (ref 0.0–149.0)
VLDL: 10 mg/dL (ref 0.0–40.0)

## 2017-07-15 LAB — POCT URINALYSIS DIPSTICK
Bilirubin, UA: NEGATIVE
GLUCOSE UA: NEGATIVE
Ketones, UA: NEGATIVE
LEUKOCYTES UA: NEGATIVE
NITRITE UA: NEGATIVE
Protein, UA: NEGATIVE
Spec Grav, UA: 1.015 (ref 1.010–1.025)
UROBILINOGEN UA: 0.2 U/dL
pH, UA: 7 (ref 5.0–8.0)

## 2017-07-15 LAB — COMPREHENSIVE METABOLIC PANEL
ALT: 13 U/L (ref 0–35)
AST: 18 U/L (ref 0–37)
Albumin: 4.3 g/dL (ref 3.5–5.2)
Alkaline Phosphatase: 59 U/L (ref 39–117)
BUN: 13 mg/dL (ref 6–23)
CHLORIDE: 100 meq/L (ref 96–112)
CO2: 30 mEq/L (ref 19–32)
Calcium: 10 mg/dL (ref 8.4–10.5)
Creatinine, Ser: 0.64 mg/dL (ref 0.40–1.20)
GFR: 94.52 mL/min (ref >60.00)
GLUCOSE: 85 mg/dL (ref 70–99)
POTASSIUM: 4.5 meq/L (ref 3.5–5.1)
SODIUM: 137 meq/L (ref 135–145)
Total Bilirubin: 0.6 mg/dL (ref 0.2–1.2)
Total Protein: 7.3 g/dL (ref 6.0–8.3)

## 2017-07-15 LAB — CBC WITH DIFFERENTIAL/PLATELET
BASOS ABS: 0.1 10*3/uL (ref 0.0–0.1)
BASOS PCT: 1.1 % (ref 0.0–3.0)
EOS PCT: 3.8 % (ref 0.0–5.0)
Eosinophils Absolute: 0.2 10*3/uL (ref 0.0–0.7)
HEMATOCRIT: 41.9 % (ref 36.0–46.0)
Hemoglobin: 13.7 g/dL (ref 12.0–15.0)
LYMPHS ABS: 1.8 10*3/uL (ref 0.7–4.0)
Lymphocytes Relative: 37.7 % (ref 12.0–46.0)
MCHC: 32.8 g/dL (ref 30.0–36.0)
MCV: 85.6 fl (ref 78.0–100.0)
MONO ABS: 0.5 10*3/uL (ref 0.1–1.0)
Monocytes Relative: 10.8 % (ref 3.0–12.0)
Neutro Abs: 2.3 10*3/uL (ref 1.4–7.7)
Neutrophils Relative %: 46.6 % (ref 43.0–77.0)
PLATELETS: 230 10*3/uL (ref 150.0–400.0)
RBC: 4.9 Mil/uL (ref 3.87–5.11)
RDW: 15 % (ref 11.5–15.5)
WBC: 4.9 10*3/uL (ref 4.0–10.5)

## 2017-07-15 LAB — TSH: TSH: 1.16 u[IU]/mL (ref 0.35–4.50)

## 2017-07-15 MED ORDER — CETIRIZINE HCL 10 MG PO CAPS: 10.0000 mg | ORAL_CAPSULE | Freq: Every day | ORAL | 3 refills | Status: DC

## 2017-07-15 NOTE — Patient Instructions (Signed)
First of all, check with your insurance company to see if provider is in network    [facility]   (for wigs and compression sleeves / gloves/gauntlets )  [address] [place], [place] [address] [phone]  Will file some insurances --- call for appointment   [facility] (for mastectomy prosthetics and garments) [address] [place], [place] [address] [phone] Will file some insurances --- call for appointment  [facility]  [address]  [place], [place] [address] [phone] Lower extremity garments  [facility] [address] [place], [place]  [address] [phone]  [facility] Sales rep:  Matt Lawson:  [phone] [email or web address] Biocompression pumps   [facility]  Sales rep: Robert Rollins:  [phone] [email or web address] Entre and Flexitouch pumps    Other Resources: National Lymphedema Network:  www.lymphnet.org [email or web address] for patient articles and purchase a self manual lymph drainage DVD [email or web address] has informative articles.  

## 2017-07-15 NOTE — Progress Notes (Signed)
Subjective:    Patient ID: Cindy Schwartz, female    DOB: 24-Dec-1933, 83 y.o.   MRN: [id number]  HPI  83 year old patient who is seen today for a preventive health examination as well as a subsequent Medicare wellness visit She has a history of osteoporosis, gated by vertebral compression fractures.  She is scheduled for follow-up bone density study tomorrow.  She has been very reluctant to consider therapy due to perceived side effects of medications She has a history of Raynaud phenomenon which has been bothersome during the colder months. She has allergic rhinitis.  Past Medical History:  Diagnosis Date  . Aneurysm of ascending aorta ([place])    a. 4.2-4.3cm in 2014.  Marland Kitchen Anxiety   . Breast cancer ([place]) 2018   right mastectomy  . Cancer ([place]) 1980   ovarian cancer  . Elevated BP Transient 09/2014  . GERD (gastroesophageal reflux disease)   . H/O bone density study    2015  . H/O colonoscopy   . History of ovarian cancer    November 1979  . 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 Insomnia   . Normal coronary arteries Cath - 09/2014  . Osteoporosis   . Raynaud's disease      Social History   Social History  . Marital status: Married    Spouse name: Publishing copy  . Number of children: 2  . Years of education: N/A   Occupational History  . retired Retired   Social History Main Topics  . Smoking status: Never Smoker  . Smokeless tobacco: Never Used  . Alcohol use No  . Drug use: No  . Sexual activity: Not on file   Other Topics Concern  . Not on file   Social History Narrative   She and her husband are from the [place] of [place].`   Children: [name] Nicholl age 66, lives in [place], [place], age 21, lives in [place], [place]    Past Surgical History:  Procedure Laterality Date  . ABDOMINAL HYSTERECTOMY    . APPENDECTOMY    . BILATERAL SALPINGOOPHORECTOMY    . BREAST LUMPECTOMY    .  CARDIAC CATHETERIZATION    . LEFT HEART CATHETERIZATION WITH CORONARY ANGIOGRAM N/A 10/17/2014   Procedure: LEFT HEART CATHETERIZATION WITH CORONARY ANGIOGRAM;  Surgeon: [name], MD;  Location: [facility] CATH LAB;  Service: Cardiovascular;  Laterality: N/A;  . MASTECTOMY W/ SENTINEL NODE BIOPSY Right 05/05/2017   total mastectomy  . ovarian ca s/p taj/bso  1979  . SIMPLE MASTECTOMY WITH AXILLARY SENTINEL NODE BIOPSY Right 05/05/2017   Procedure: RIGHT TOTAL MASTECTOMY WITH RIGHT  AXILLARY SENTINEL NODE BIOPSY;  Surgeon: [name], MD;  Location: [facility];  Service: General;  Laterality: Right;  PECTORAL BLOCK    Family History  Problem Relation Age of Onset  . Healthy Mother   . Healthy Brother   . Breast cancer Cousin 71       mat first cousin related through uncle  . Esophageal cancer Cousin 64       mat first cousin related through uncle    Allergies  Allergen Reactions  . Chlorhexidine Gluconate Rash    Current Outpatient Prescriptions on File Prior to Visit  Medication Sig Dispense Refill  . alendronate (FOSAMAX) 70 MG tablet Take 1 tablet (70 mg total) by mouth once a week. Take with a full glass of water on an empty stomach. 12 tablet 3  .  CALCIUM-MAG-VIT C-VIT D PO Take 15 mLs by mouth daily.    . cetirizine (ZYRTEC) 10 MG tablet Take 10 mg by mouth daily as needed for allergies.     Marland Kitchen NEXIUM 40 MG capsule TAKE 1 CAPSULE DAILY (Patient taking differently: TAKE 1 CAPSULE EVERY OTHER DAY) 90 capsule 3  . Omega-3 Fatty Acids (FISH OIL PO) Take 5 mLs by mouth daily.    . traMADol (ULTRAM) 50 MG tablet Take 1 tablet (50 mg total) by mouth 2 (two) times daily as needed. 30 tablet 0  . traMADol (ULTRAM) 50 MG tablet Take 1 tablet (50 mg total) by mouth every 6 (six) hours as needed for moderate pain. 15 tablet 0  . valACYclovir (VALTREX) 500 MG tablet Take 1 tablet (500 mg total) by mouth once a week. (Patient taking differently: Take 500 mg by mouth 2 (two) times a week. ) 12  tablet 3   No current facility-administered medications on file prior to visit.     BP 128/70 (BP Location: Left Arm, Patient Position: Standing, Cuff Size: Normal)   Pulse 75   Temp 97.6 F (36.4 C) (Oral)   Ht 5\' 7"  (1.702 m)   Wt 142 lb 12.8 oz (64.8 kg)   SpO2 98%   BMI 22.37 kg/m   Subsequent Medicare wellness visit  1. Risk factors, based on past  M,S,F history.  No major cardiovascular risk factors. She has a history of a thoracic artery aneurysm.  Evaluation has included a heart catheterization with normal coronary arteries  2.  Physical activities:no activity restrictions, but very sedentary  3.  Depression/mood:no history of major depression  4.  Hearing:no significant deficits  5.  ADL's:independent  6.  Fall risk:low  7.  Home safety:no problems identified  8.  Height weight, and visual acuity;height and weight stable  9.  Counseling: continue heart healthy diet increase level of activity.  Encouraged  10. Lab orders based on risk factors:laboratory update will be reviewed 11. Referral :follow-up hematology and vascular surgery  12. Care plan:continue efforts at aggressive risk factor modification  13. Cognitive assessment: alert and oil with normal affect no cognitive dysfunction  14. Screening: Patient provided with a written and personalized 5-10 year screening schedule in the AVS.    15. Provider List Update: primary care cardiology, vascular surgery and oncology.  She is also been followed by ENT    Review of Systems  Constitutional: Negative.   HENT: Negative for congestion, dental problem, hearing loss, rhinorrhea, sinus pressure, sore throat and tinnitus.   Eyes: Negative for pain, discharge and visual disturbance.  Respiratory: Negative for cough and shortness of breath.   Cardiovascular: Negative for chest pain, palpitations and leg swelling.  Gastrointestinal: Negative for abdominal distention, abdominal pain, blood in stool, constipation,  diarrhea, nausea and vomiting.  Genitourinary: Negative for difficulty urinating, dysuria, flank pain, frequency, hematuria, pelvic pain, urgency, vaginal bleeding, vaginal discharge and vaginal pain.  Musculoskeletal: Positive for arthralgias. Negative for gait problem and joint swelling.  Skin: Negative for rash.       Raynaud's  involving primarily the right second and third fingertips  Neurological: Negative for dizziness, syncope, speech difficulty, weakness, numbness and headaches.  Hematological: Negative for adenopathy.  Psychiatric/Behavioral: Negative for agitation, behavioral problems and dysphoric mood. The patient is not nervous/anxious.        Objective:   Physical Exam  Constitutional: She is oriented to person, place, and time. She appears well-developed and well-nourished.  HENT:  Head: Normocephalic  and atraumatic.  Right Ear: External ear normal.  Left Ear: External ear normal.  Mouth/Throat: Oropharynx is clear and moist.  Eyes: Conjunctivae and EOM are normal.  Neck: Normal range of motion. Neck supple. No JVD present. No thyromegaly present.  Cardiovascular: Normal rate, regular rhythm, normal heart sounds and intact distal pulses.   No murmur heard. Pulmonary/Chest: Effort normal and breath sounds normal. She has no wheezes. She has no rales.  Abdominal: Soft. Bowel sounds are normal. She exhibits no distension and no mass. There is no tenderness. There is no rebound and no guarding.  Genitourinary: Vagina normal.  Musculoskeletal: Normal range of motion. She exhibits no edema or tenderness.  Neurological: She is alert and oriented to person, place, and time. She has normal reflexes. No cranial nerve deficit. She exhibits normal muscle tone. Coordination normal.  Skin: Skin is warm and dry. No rash noted.  Psychiatric: She has a normal mood and affect. Her behavior is normal.          Assessment & Plan:   Preventive health examination [place] Medicare  wellness visit Aneurysm of the thoracic aorta.  Will discuss statin therapy again.  She states that she will discuss again with cardiology Raynaud's t syndrome Osteoporosis.  Follow-up bone density study scheduled for tomorrow  Continue calcium and vitamin D supplements Review bone density and consider further therapy Follow-up vascular surgery  Follow-up oncology Flu vaccine administered  Nyoka Cowden

## 2017-07-15 NOTE — Patient Instructions (Signed)
Limit your sodium (Salt) intake  Take a calcium supplement, plus 800-1200 units of vitamin D    It is important that you exercise regularly, at least 20 minutes 3 to 4 times per week.  If you develop chest pain or shortness of breath seek  medical attention.  Return in 6 months for follow-up 

## 2017-07-15 NOTE — Therapy (Signed)
[place], [place], [address] Phone: [phone]   Fax:  [phone]  Physical Therapy Treatment  Patient Details  Name: Cindy Schwartz MRN: [id number] Date of Birth: October 18, 1933 Referring Provider: Dr. [name]  Encounter Date: 07/15/2017      PT End of Session - 07/15/17 1150    Visit Number 4   Number of Visits 9   Date for PT Re-Evaluation 07/23/17   PT Start Time 1108   PT Stop Time 1149   PT Time Calculation (min) 41 min   Activity Tolerance Patient tolerated treatment well   Behavior During Therapy [facility] for tasks assessed/performed      Past Medical History:  Diagnosis Date  . Aneurysm of ascending aorta ([place])    a. 4.2-4.3cm in 2014.  Marland Kitchen Anxiety   . Breast cancer ([place]) 2018   right mastectomy  . Cancer ([name]) 1980   ovarian cancer  . Elevated BP Transient 09/2014  . GERD (gastroesophageal reflux disease)   . H/O bone density study    2015  . H/O colonoscopy   . History of ovarian cancer    November 1979  . 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 Insomnia   . Normal coronary arteries Cath - 09/2014  . Osteoporosis   . Raynaud's disease     Past Surgical History:  Procedure Laterality Date  . ABDOMINAL HYSTERECTOMY    . APPENDECTOMY    . BILATERAL SALPINGOOPHORECTOMY    . BREAST LUMPECTOMY    . CARDIAC CATHETERIZATION    . LEFT HEART CATHETERIZATION WITH CORONARY ANGIOGRAM N/A 10/17/2014   Procedure: LEFT HEART CATHETERIZATION WITH CORONARY ANGIOGRAM;  Surgeon: [name], MD;  Location: [facility] CATH LAB;  Service: Cardiovascular;  Laterality: N/A;  . MASTECTOMY W/ SENTINEL NODE BIOPSY Right 05/05/2017   total mastectomy  . ovarian ca s/p taj/bso  1979  . SIMPLE MASTECTOMY WITH AXILLARY SENTINEL NODE BIOPSY Right 05/05/2017   Procedure: RIGHT TOTAL MASTECTOMY WITH RIGHT  AXILLARY SENTINEL NODE BIOPSY;  Surgeon: [name], MD;  Location: [place];  Service: General;  Laterality: Right;  PECTORAL BLOCK    There were no vitals filed for this visit.      Subjective Assessment - 07/15/17 1110    Subjective I overdid it on Saturday. I did a lot of vacuuming. I felt it when I was done. I was uncomfortable afterwards.    Pertinent History R breast cancer s/p R mastectomy and SLNB on 05/05/17, hx of ovarian cancer   Patient Stated Goals pt does not really have a goal because she feels she is doing okay   Currently in Pain? No/denies   Pain Score 0-No pain                         [facility] Adult PT Treatment/Exercise - 07/15/17 0001      Shoulder Exercises: Standing   Other Standing Exercises Instructed pt in Strength ABC program through squats- had pt do all stretches for 30 sec bilaterally, she used 2 lb weights for resisted exercises and performed all exercises x 10 reps                 [facility] Goals - 07/08/17 1347      CC Long Term Goal  #1   Title Pt will demonstrate 160 degrees of right shoulder flexion to allow her to reach overhead  Baseline 145, 07/08/17- 155 degrees   Time 4   Period Weeks   Status On-going     CC Long Term Goal  #2   Title Pt will demonstrate 165 degrees of right shoulder abduction to allow her to reach out to sides   Baseline 154, 07/08/17- 163 degrees   Time 4   Period Weeks   Status On-going     CC Long Term Goal  #3   Title Pt will report  no tightness in R chest with R UE movement to allow improved comfort.   Baseline 07/08/17- pt reports she is still having tightness with this   Time 4   Period Weeks   Status On-going     CC Long Term Goal  #4   Title Pt will be able to independently verbalize lymphedema risk reduction practices   Baseline 07/08/17- pt required cueing for this   Time 4   Period Weeks   Status On-going     CC Long Term Goal  #5   Title Pt will be independent in a home exercise program for continued  strengthening and stretching   Time 4   Period Weeks   Status On-going            Plan - 07/15/17 1151    Clinical Impression Statement Began instructing pt in Strength ABC program today for pt to continue with at home. Instructed pt in exercises through squats and will instruct pt in remainder of exercises at next visit. Encouraged pt to perform 1x/day. Gave pt signed Rx for sleeve today so she can go and get measured.    Rehab Potential Excellent   PT Frequency 2x / week   PT Duration 4 weeks   PT Treatment/Interventions ADLs/Self Care Home Management;Therapeutic exercise;Therapeutic activities;Patient/family education;Orthotic Fit/Training;Manual techniques;Manual lymph drainage;Compression bandaging;Passive range of motion;Scar mobilization;Taping   PT Next Visit Plan continue instructing pt in Strength ABC packet, see if she was measured for sleeve      Patient will benefit from skilled therapeutic intervention in order to improve the following deficits and impairments:  Pain, Decreased range of motion, Decreased strength, Increased edema, Decreased knowledge of precautions, Increased fascial restricitons, Postural dysfunction  Visit Diagnosis: Stiffness of right shoulder, not elsewhere classified  Muscle weakness (generalized)  Abnormal posture     Problem List Patient Active Problem List   Diagnosis Date Noted  . Breast cancer, right ([place]) 05/05/2017  . Closed compression fracture of L1 lumbar vertebra ([place]) 03/18/2017  . Breast cancer of lower-outer quadrant of right female breast ([place]) 01/16/2016  . Palpitations 11/13/2014  . Normal coronary arteries 10/17/2014  . Chest pain   . Varicose veins of lower extremities with other complications 16/03/3709  . Hx of herpes simplex infection 12/18/2011  . COMPRESSION FRACTURE, THORACIC VERTEBRA 07/24/2010  . BACK PAIN, LUMBAR 06/28/2010  . NECK AND BACK PAIN 01/18/2010  . OSTEOARTHROSIS UNSPEC WHETHER GEN/LOC LOWER LEG  09/05/2009  . [place] REGION 02/02/2009  . GENERALIZED ANXIETY DISORDER 10/13/2008  . IRRITABLE BOWEL SYNDROME 10/13/2008  . HERPES SIMPLEX INFECTION 08/24/2008  . LUNG NODULE 11/25/2007  . Raynaud's syndrome 07/01/2007  . Aneurysm of thoracic aorta ([place]) 05/06/2007  . Allergic rhinitis 05/06/2007  . Esophageal reflux 05/06/2007  . DEGENERATIVE DISC DISEASE, CERVICAL SPINE 05/06/2007  . Osteoporosis 03/18/2007  . History of ovarian cancer 09/22/1977    Allyson Sabal [facility] 07/15/2017, 11:53 AM  [place], [place],  [address] Phone: [phone]   Fax:  [phone]  Name: Cindy Schwartz MRN: [id number] Date of Birth: Nov 12, 1933  Manus Gunning, PT 07/15/17 11:53 AM

## 2017-07-17 ENCOUNTER — Encounter: Payer: Self-pay | Admitting: Physical Therapy

## 2017-07-17 ENCOUNTER — Ambulatory Visit: Payer: Medicare Other | Admitting: Physical Therapy

## 2017-07-17 DIAGNOSIS — M25611 Stiffness of right shoulder, not elsewhere classified: Secondary | ICD-10-CM | POA: Diagnosis not present

## 2017-07-17 DIAGNOSIS — R293 Abnormal posture: Secondary | ICD-10-CM

## 2017-07-17 DIAGNOSIS — M6281 Muscle weakness (generalized): Secondary | ICD-10-CM

## 2017-07-17 NOTE — Therapy (Signed)
[place], [place], [address] Phone: [phone]   Fax:  [phone]  Physical Therapy Treatment  Patient Details  Name: Cindy Schwartz MRN: [id number] Date of Birth: 1933/10/30 Referring Provider: Dr. [name]  Encounter Date: 07/17/2017      PT End of Session - 07/17/17 1155    Visit Number 5   Number of Visits 9   Date for PT Re-Evaluation 07/23/17   PT Start Time 1108   PT Stop Time 1148   PT Time Calculation (min) 40 min   Activity Tolerance Patient tolerated treatment well   Behavior During Therapy [facility] for tasks assessed/performed      Past Medical History:  Diagnosis Date  . Aneurysm of ascending aorta ([place])    a. 4.2-4.3cm in 2014.  Marland Kitchen Anxiety   . Breast cancer ([place]) 2018   right mastectomy  . Cancer ([place]) 1980   ovarian cancer  . Elevated BP Transient 09/2014  . GERD (gastroesophageal reflux disease)   . H/O bone density study    2015  . H/O colonoscopy   . History of ovarian cancer    November 1979  . 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 Insomnia   . Normal coronary arteries Cath - 09/2014  . Osteoporosis   . Raynaud's disease     Past Surgical History:  Procedure Laterality Date  . ABDOMINAL HYSTERECTOMY    . APPENDECTOMY    . BILATERAL SALPINGOOPHORECTOMY    . BREAST LUMPECTOMY    . CARDIAC CATHETERIZATION    . LEFT HEART CATHETERIZATION WITH CORONARY ANGIOGRAM N/A 10/17/2014   Procedure: LEFT HEART CATHETERIZATION WITH CORONARY ANGIOGRAM;  Surgeon: [name], MD;  Location: [facility] CATH LAB;  Service: Cardiovascular;  Laterality: N/A;  . MASTECTOMY W/ SENTINEL NODE BIOPSY Right 05/05/2017   total mastectomy  . ovarian ca s/p taj/bso  1979  . SIMPLE MASTECTOMY WITH AXILLARY SENTINEL NODE BIOPSY Right 05/05/2017   Procedure: RIGHT TOTAL MASTECTOMY WITH RIGHT  AXILLARY SENTINEL NODE BIOPSY;  Surgeon: [name], MD;  Location: [place];  Service: General;  Laterality: Right;  PECTORAL BLOCK    There were no vitals filed for this visit.      Subjective Assessment - 07/17/17 1109    Subjective I have done some exercises but I haven't done much. They are ok but I just need to get into the routine.    Pertinent History R breast cancer s/p R mastectomy and SLNB on 05/05/17, hx of ovarian cancer   Patient Stated Goals pt does not really have a goal because she feels she is doing okay   Currently in Pain? No/denies   Pain Score 0-No pain                         [facility] Adult PT Treatment/Exercise - 07/17/17 0001      Shoulder Exercises: Standing   Other Standing Exercises Instructed pt in Strength ABC program starting at squats and completing remaining exercises, then reinstructed pt in all exercises lying down                  [place] [facility] Goals - 07/08/17 1347      CC Long Term Goal  #1   Title Pt will demonstrate 160 degrees of right shoulder flexion to allow her to reach overhead   Baseline 145, 07/08/17- 155 degrees   Time  4   Period Weeks   Status On-going     CC Long Term Goal  #2   Title Pt will demonstrate 165 degrees of right shoulder abduction to allow her to reach out to sides   Baseline 154, 07/08/17- 163 degrees   Time 4   Period Weeks   Status On-going     CC Long Term Goal  #3   Title Pt will report  no tightness in R chest with R UE movement to allow improved comfort.   Baseline 07/08/17- pt reports she is still having tightness with this   Time 4   Period Weeks   Status On-going     CC Long Term Goal  #4   Title Pt will be able to independently verbalize lymphedema risk reduction practices   Baseline 07/08/17- pt required cueing for this   Time 4   Period Weeks   Status On-going     CC Long Term Goal  #5   Title Pt will be independent in a home exercise program for continued strengthening and stretching   Time 4   Period  Weeks   Status On-going            Plan - 07/17/17 1154    Clinical Impression Statement Instructed pt in the remaining part of the Strength ABC program today and then re educated pt in supine exercises in the program because she has not practiced these yet at home. Pt is going to get measured for her sleeve today.    Rehab Potential Excellent   PT Frequency 2x / week   PT Duration 4 weeks   PT Treatment/Interventions ADLs/Self Care Home Management;Therapeutic exercise;Therapeutic activities;Patient/family education;Orthotic Fit/Training;Manual techniques;Manual lymph drainage;Compression bandaging;Passive range of motion;Scar mobilization;Taping   PT Next Visit Plan continue instructing pt in Strength ABC packet,    Consulted and Agree with Plan of Care Patient      Patient will benefit from skilled therapeutic intervention in order to improve the following deficits and impairments:  Pain, Decreased range of motion, Decreased strength, Increased edema, Decreased knowledge of precautions, Increased fascial restricitons, Postural dysfunction  Visit Diagnosis: Stiffness of right shoulder, not elsewhere classified  Muscle weakness (generalized)  Abnormal posture     Problem List Patient Active Problem List   Diagnosis Date Noted  . Breast cancer, right ([place]) 05/05/2017  . Closed compression fracture of L1 lumbar vertebra ([place]) 03/18/2017  . Breast cancer of lower-outer quadrant of right female breast ([place]) 01/16/2016  . Palpitations 11/13/2014  . Normal coronary arteries 10/17/2014  . Chest pain   . Varicose veins of lower extremities with other complications 57/32/2025  . Hx of herpes simplex infection 12/18/2011  . COMPRESSION FRACTURE, THORACIC VERTEBRA 07/24/2010  . BACK PAIN, LUMBAR 06/28/2010  . NECK AND BACK PAIN 01/18/2010  . OSTEOARTHROSIS UNSPEC WHETHER GEN/LOC LOWER LEG 09/05/2009  . [place] REGION 02/02/2009  . GENERALIZED  ANXIETY DISORDER 10/13/2008  . IRRITABLE BOWEL SYNDROME 10/13/2008  . HERPES SIMPLEX INFECTION 08/24/2008  . LUNG NODULE 11/25/2007  . Raynaud's syndrome 07/01/2007  . Aneurysm of thoracic aorta ([place]) 05/06/2007  . Allergic rhinitis 05/06/2007  . Esophageal reflux 05/06/2007  . DEGENERATIVE DISC DISEASE, CERVICAL SPINE 05/06/2007  . Osteoporosis 03/18/2007  . History of ovarian cancer 09/22/1977    Allyson Sabal [facility] 07/17/2017, 11:55 AM  [place], [place], [address] Phone: [phone]   Fax:  [phone]  Name: Cindy Schwartz MRN:  [id number] Date of Birth: 05/28/1934  Manus Gunning, PT 07/17/17 11:56 AM

## 2017-07-20 ENCOUNTER — Ambulatory Visit: Payer: Medicare Other | Admitting: Physical Therapy

## 2017-07-20 ENCOUNTER — Encounter: Payer: Self-pay | Admitting: Physical Therapy

## 2017-07-20 DIAGNOSIS — M25611 Stiffness of right shoulder, not elsewhere classified: Secondary | ICD-10-CM | POA: Diagnosis not present

## 2017-07-20 DIAGNOSIS — R293 Abnormal posture: Secondary | ICD-10-CM

## 2017-07-20 DIAGNOSIS — M6281 Muscle weakness (generalized): Secondary | ICD-10-CM

## 2017-07-20 NOTE — Therapy (Signed)
[place], [place], [address] Phone: [phone]   Fax:  [phone]  Physical Therapy Treatment  Patient Details  Name: Cindy Schwartz MRN: [id number] Date of Birth: 1934/05/03 Referring Provider: Dr. [name]  Encounter Date: 07/20/2017      PT End of Session - 07/20/17 1245    Visit Number 6   Number of Visits 9   Date for PT Re-Evaluation 07/23/17   PT Start Time 0935   PT Stop Time 1016   PT Time Calculation (min) 41 min   Activity Tolerance Patient tolerated treatment well   Behavior During Therapy [facility] for tasks assessed/performed      Past Medical History:  Diagnosis Date  . Aneurysm of ascending aorta ([place])    a. 4.2-4.3cm in 2014.  Marland Kitchen Anxiety   . Breast cancer ([place]) 2018   right mastectomy  . Cancer ([place]) 1980   ovarian cancer  . Elevated BP Transient 09/2014  . GERD (gastroesophageal reflux disease)   . H/O bone density study    2015  . H/O colonoscopy   . History of ovarian cancer    November 1979  . 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 Insomnia   . Normal coronary arteries Cath - 09/2014  . Osteoporosis   . Raynaud's disease     Past Surgical History:  Procedure Laterality Date  . ABDOMINAL HYSTERECTOMY    . APPENDECTOMY    . BILATERAL SALPINGOOPHORECTOMY    . BREAST LUMPECTOMY    . CARDIAC CATHETERIZATION    . LEFT HEART CATHETERIZATION WITH CORONARY ANGIOGRAM N/A 10/17/2014   Procedure: LEFT HEART CATHETERIZATION WITH CORONARY ANGIOGRAM;  Surgeon: [name], MD;  Location: [facility] CATH LAB;  Service: Cardiovascular;  Laterality: N/A;  . MASTECTOMY W/ SENTINEL NODE BIOPSY Right 05/05/2017   total mastectomy  . ovarian ca s/p taj/bso  1979  . SIMPLE MASTECTOMY WITH AXILLARY SENTINEL NODE BIOPSY Right 05/05/2017   Procedure: RIGHT TOTAL MASTECTOMY WITH RIGHT  AXILLARY SENTINEL NODE BIOPSY;  Surgeon: [name], MD;  Location: [place];  Service: General;  Laterality: Right;  PECTORAL BLOCK    There were no vitals filed for this visit.      Subjective Assessment - 07/20/17 0937    Subjective Yesterday I didn't do any exercises because Sunday is a very busy day. I did some Saturday. I did most of them.    Pertinent History R breast cancer s/p R mastectomy and SLNB on 05/05/17, hx of ovarian cancer   Patient Stated Goals pt does not really have a goal because she feels she is doing okay   Currently in Pain? No/denies   Pain Score 0-No pain                         [facility] Adult PT Treatment/Exercise - 07/20/17 0001      High Level Balance   High Level Balance Comments Pt states she was having trouble with her balance: instructed pt in braiding x 2 down hall, heel walking, toe walking, tandem walking with no losses of balance, had pt stand on foam with narrow BOS, narrow BOS and eyes closed, tandem stance and SLS with no losses of balance      Shoulder Exercises: Standing   Other Standing Exercises Reviewed the following exercises from Strength ABC program, squats with 2 lb weights x 10, tricep kickbacks x 10  [facility] Goals - 07/20/17 [phone]      CC Long Term Goal  #1   Title Pt will demonstrate 160 degrees of right shoulder flexion to allow her to reach overhead   Baseline 145, 07/08/17- 155 degrees, 07/20/17- 160 degrees   Time 4   Period Weeks   Status Achieved     CC Long Term Goal  #2   Title Pt will demonstrate 165 degrees of right shoulder abduction to allow her to reach out to sides   Baseline 154, 07/08/17- 163 degrees, 07/20/17- 168 degrees   Time 4   Period Weeks   Status Achieved     CC Long Term Goal  #3   Title Pt will report  no tightness in R chest with R UE movement to allow improved comfort.   Baseline 07/08/17- pt reports she is still having tightness with this, 07/20/17- pt reports her tightness is still there but  feels she will need time   Time 4   Period Weeks   Status Partially Met     CC Long Term Goal  #4   Title Pt will be able to independently verbalize lymphedema risk reduction practices   Baseline 07/08/17- pt required cueing for this, 07/20/17- pt able to independently verbalize this   Time 4   Period Weeks   Status Achieved     CC Long Term Goal  #5   Title Pt will be independent in a home exercise program for continued strengthening and stretching   Time 4   Period Weeks   Status Achieved            Plan - 07/20/17 1245    Clinical Impression Statement Assesed pt's balance today since pt stated she felt she had some balance problems. Did high level balance activities including on foam and braiding down hall. Pt did great with all balance exercises today. She feels ready to be discharged from therapy. Answered remaining questions on a few of the exercises in Strength ABC program today. Pt will be discharged at this time.    Rehab Potential Excellent   PT Frequency 2x / week   PT Duration 4 weeks   PT Treatment/Interventions ADLs/Self Care Home Management;Therapeutic exercise;Therapeutic activities;Patient/family education;Orthotic Fit/Training;Manual techniques;Manual lymph drainage;Compression bandaging;Passive range of motion;Scar mobilization;Taping   PT Next Visit Plan dc this visit      Patient will benefit from skilled therapeutic intervention in order to improve the following deficits and impairments:  Pain, Decreased range of motion, Decreased strength, Increased edema, Decreased knowledge of precautions, Increased fascial restricitons, Postural dysfunction  Visit Diagnosis: Stiffness of right shoulder, not elsewhere classified  Muscle weakness (generalized)  Abnormal posture     Problem List Patient Active Problem List   Diagnosis Date Noted  . Breast cancer, right ([place]) 05/05/2017  . Closed compression fracture of L1 lumbar vertebra ([place]) 03/18/2017  .  Breast cancer of lower-outer quadrant of right female breast ([place]) 01/16/2016  . Palpitations 11/13/2014  . Normal coronary arteries 10/17/2014  . Chest pain   . Varicose veins of lower extremities with other complications 09/40/7680  . Hx of herpes simplex infection 12/18/2011  . COMPRESSION FRACTURE, THORACIC VERTEBRA 07/24/2010  . BACK PAIN, LUMBAR 06/28/2010  . NECK AND BACK PAIN 01/18/2010  . OSTEOARTHROSIS UNSPEC WHETHER GEN/LOC LOWER LEG 09/05/2009  . [place] REGION 02/02/2009  . GENERALIZED ANXIETY DISORDER 10/13/2008  . IRRITABLE BOWEL SYNDROME 10/13/2008  . HERPES SIMPLEX INFECTION 08/24/2008  .  LUNG NODULE 11/25/2007  . Raynaud's syndrome 07/01/2007  . Aneurysm of thoracic aorta ([place]) 05/06/2007  . Allergic rhinitis 05/06/2007  . Esophageal reflux 05/06/2007  . DEGENERATIVE DISC DISEASE, CERVICAL SPINE 05/06/2007  . Osteoporosis 03/18/2007  . History of ovarian cancer 09/22/1977    Allyson Sabal [facility] 07/20/2017, 12:49 PM  [place], [place], [address] Phone: [phone]   Fax:  [phone]  Name: Marelly Wehrman MRN: [id number] Date of Birth: [birth year]  PHYSICAL THERAPY DISCHARGE SUMMARY  Visits from Start of Care: 6  Current functional level related to goals / functional outcomes: See above   Remaining deficits: None  Education / Equipment: HEP, compression sleeve Plan: Patient agrees to discharge.  Patient goals were partially met. Patient is being discharged due to meeting the stated rehab goals.  ?????    Allyson Sabal [place], [place] 07/20/17 12:50 PM

## 2017-07-21 LAB — HM DEXA SCAN

## 2017-07-21 NOTE — Progress Notes (Signed)
07/22/2017 Cindy Schwartz   [birth year]  [id number]  Primary Physician Cindy Lor, MD Primary Cardiologist: Dr. Johnsie Schwartz    Reason for Visit/CC: Dizziness, lightheadedness  HPI:  F/U palpitations, dizziness and aortic root dilatation   She has a history of stable fusiform dilatation of aortic root 4.1 cm. She  was admitted to the hospital last year in Jan 2016 with chest pain associated with blood pressures of 171/109 which was unusual for her. Cardiac catheterization showed normal coronary arteries, normal LV function and mild to moderate dilatation of the ascending aorta without evidence of dissection. She also has a history of a thoracic aortic aneurysm last measured in 2014 at 4.2-4.3 cm followed by Dr. Cyndia Schwartz. Event monitor was neg last year. NSR with PACs/PVCs and 8 beat run of SVT. She has had a lot of stress Husband with lung cancer and she has breast cancer. 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 CALCIUM-MAG-VIT C-VIT D PO Take 15 mLs by mouth daily.  Marland Kitchen esomeprazole (NEXIUM) 40 MG capsule Take 40 mg by mouth every other day.  Marland Kitchen NEXIUM 40 MG capsule TAKE 1 CAPSULE DAILY (Patient taking differently: TAKE 1 CAPSULE EVERY OTHER DAY)  . Omega-3 Fatty Acids (FISH OIL PO) Take 5 mLs by mouth daily.  . traMADol (ULTRAM) 50 MG tablet Take 1 tablet (50 mg total) by mouth every 6 (six) hours as needed for moderate pain.  . valACYclovir (VALTREX) 500 MG tablet Take 500 mg by mouth 2 (two) times a week.   Allergies  Allergen Reactions  . Chlorhexidine Gluconate Rash   Past Medical History:  Diagnosis Date  . Aneurysm of ascending aorta ([place])    a. 4.2-4.3cm in 2014.  Marland Kitchen Anxiety   . Breast cancer ([place]) 2018   right mastectomy  . Cancer ([place]) 1980   ovarian  cancer  . Elevated BP Transient 09/2014  . GERD (gastroesophageal reflux disease)   . H/O bone density study    2015  . H/O colonoscopy   . History of ovarian cancer    November 1979  . 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 Insomnia   . Normal coronary arteries Cath - 09/2014  . Osteoporosis   . Raynaud's disease    Family History  Problem Relation Age of Onset  . Healthy Mother   . Healthy Brother   . Breast cancer Cousin 21       mat first cousin related through uncle  . Esophageal cancer Cousin 47       mat first cousin related through uncle   Past Surgical History:  Procedure Laterality Date  . ABDOMINAL HYSTERECTOMY    . APPENDECTOMY    . BILATERAL SALPINGOOPHORECTOMY    . BREAST LUMPECTOMY    . CARDIAC CATHETERIZATION    . LEFT HEART CATHETERIZATION WITH CORONARY ANGIOGRAM N/A 10/17/2014   Procedure: LEFT HEART CATHETERIZATION WITH CORONARY ANGIOGRAM;  Surgeon: Cindy Sine, MD;  Location: [facility] C[facility] LAB;  Service: Cardiovascular;  Laterality: N/A;  . MASTECTOMY W/ SENTINEL NODE BIOPSY Right 05/05/2017   total mastectomy  . ovarian ca s/p taj/bso  1979  . SIMPLE MASTECTOMY WITH AXILLARY SENTINEL NODE BIOPSY Right 05/05/2017   Procedure: RIGHT  TOTAL MASTECTOMY WITH RIGHT  AXILLARY SENTINEL NODE BIOPSY;  Surgeon: Cindy Bookbinder, MD;  Location: [facility];  Service: General;  Laterality: Right;  PECTORAL BLOCK   Social History   Social History  . Marital status: Married    Spouse name: Cindy Schwartz  . Number of children: 2  . Years of education: N/A   Occupational History  . retired Retired   Social History Main Topics  . Smoking status: Never Smoker  . Smokeless tobacco: Never Used  . Alcohol use No  . Drug use: No  . Sexual activity: Not on file   Other Topics Concern  . Not on file   Social History Narrative   She and her husband are from the [place] of [place].`   Children: Cindy Schwartz age 66,  lives in [place], [place], age 73, lives in [place], [place]     Review of Systems: General: negative for chills, fever, night sweats or weight changes.  Cardiovascular: negative for chest pain, dyspnea on exertion, edema, orthopnea, palpitations, paroxysmal nocturnal dyspnea or shortness of breath Dermatological: negative for rash Respiratory: negative for cough or wheezing Urologic: negative for hematuria Abdominal: negative for nausea, vomiting, diarrhea, bright red blood per rectum, melena, or hematemesis Neurologic: negative for visual changes, syncope, or dizziness All other systems reviewed and are otherwise negative except as noted above.   Physical Exam:  Blood pressure 116/64, pulse 70, height 5\' 7"  (1.702 m), weight 145 lb 12.8 oz (66.1 kg), SpO2 98 %.  Affect appropriate Healthy:  appears stated age 83: normal Neck supple with no adenopathy JVP normal no bruits no thyromegaly Lungs clear with no wheezing and good diaphragmatic motion Heart:  S1/S2 no murmur, no rub, gallop or click PMI normal post right mastectomy  Abdomen: benighn, BS positve, no tenderness, no AAA no bruit.  No HSM or HJR Distal pulses intact with no bruits No edema Neuro non-focal Skin warm and dry No muscular weakness    EKG SR with 1st degree AVB. RBBB. 07/22/17 SR rate 70 RBBB LAE   ASSESSMENT AND PLAN:   1. Dizziness/ Lightheadedness: Spells lasting only seconds. No associated chest pain, palpitations, dyspnea or syncope.  She had a negative left heart catheterization in 2016. Evaluation by Holter monitors in the past have revealed PACs/PVCs. EKG today demonstrates sinus rhythm with first-degree AV block with a heart rate of 65 bpm. Orthostatic vital signs are negative. Physical exam is unremarkable.  CBC and BMET are fine Observe  2. H/o PVCs/PAC: patent denies any recent palpiations. She takes propanolol PRN. Has not required any recent use.  Holter reviewed 09/25/15 NSR  PAC;s / PVC;s no significant arrhythmias   3. Breast CA:  Post right mastectomy [name] 05/05/17   4. Stress: patient under great deal of emotional stress, as her husband is undergoing Tx for Lung CA. Patient advised to maintain good hydration and rest, if possible.   5. Thoracic aortic aneurysm: followed by Dr. Cyndia Schwartz. MRA 12/10/16 Aorta 4.1 cm   6. Murmur:  Benign aortic sclerosis murmur Echo 2016 mild MR no AS    7. Cholesterol:  LDL 113 at her age with no CAD or bad vascular disease would not Rx with statin Which is also her preference   PLAN  She has her 6 month f/u with me   Jenkins Rouge

## 2017-07-22 ENCOUNTER — Encounter: Payer: Medicare Other | Admitting: Physical Therapy

## 2017-07-22 ENCOUNTER — Encounter: Payer: Self-pay | Admitting: Cardiovascular Disease

## 2017-07-22 ENCOUNTER — Ambulatory Visit (INDEPENDENT_AMBULATORY_CARE_PROVIDER_SITE_OTHER): Payer: Medicare Other | Admitting: Cardiovascular Disease

## 2017-07-22 ENCOUNTER — Other Ambulatory Visit: Payer: Self-pay | Admitting: Internal Medicine

## 2017-07-22 VITALS — BP 116/64 | HR 70 | Ht 67.0 in | Wt 145.8 lb

## 2017-07-22 DIAGNOSIS — R42 Dizziness and giddiness: Secondary | ICD-10-CM

## 2017-07-22 NOTE — Patient Instructions (Signed)

## 2017-07-24 ENCOUNTER — Ambulatory Visit: Payer: Medicare Other | Admitting: Physical Therapy

## 2017-07-30 ENCOUNTER — Ambulatory Visit: Payer: Self-pay | Admitting: Genetic Counselor

## 2017-07-30 ENCOUNTER — Encounter: Payer: Self-pay | Admitting: Genetic Counselor

## 2017-07-30 DIAGNOSIS — Z1379 Encounter for other screening for genetic and chromosomal anomalies: Secondary | ICD-10-CM

## 2017-07-30 NOTE — Progress Notes (Signed)
[facility]       Genetic Test Results    Patient Name: Cindy Schwartz Patient DOB: [birth year] Patient Age: 83 y.o. Encounter Date: 07/30/2017  Referring Provider: Nicholas Lose, MD [name], MD  Primary Care Provider: Marletta Lor, MD   Cindy Schwartz was called today to discuss genetic test results. Please see the Genetics note from her visit on 07/09/2017 for a detailed discussion of her personal and family history.  Genetic Testing: At the time of Cindy Schwartz's visit, she decided to pursue genetic testing of multiple genes associated with hereditary susceptibility to cancer. Testing included sequencing and deletion/duplication analysis. Testing did not reveal any pathogenic mutation in any of these genes.  A copy of the genetic test report will be scanned into Epic under the media tab.  The genes analyzed were the 83 genes on [facility]'s Multi-Cancer panel (ALK, APC, ATM, AXIN2, BAP1, BARD1, BLM, BMPR1A, BRCA1, BRCA2, BRIP1, CASR, CDC73, CDH1, CDK4, CDKN1B, CDKN1C, CDKN2A, CEBPA, CHEK2, CTNNA1, DICER1, DIS3L2, EGFR, EPCAM, FH, FLCN, GATA2, GPC3, GREM1, HOXB13, HRAS, KIT, MAX, MEN1, MET, MITF, MLH1, MSH2, MSH3, MSH6, MUTYH, NBN, NF1, NF2, NTHL1, PALB2, PDGFRA, PHOX2B, PMS2, POLD1, POLE, POT1, PRKAR1A, PTCH1, PTEN, RAD50, RAD51C, RAD51D, RB1, RECQL4, RET, RUNX1, SDHA, SDHAF2, SDHB, SDHC, SDHD, SMAD4, SMARCA4, SMARCB1, SMARCE1, STK11, SUFU, TERC, TERT, TMEM127, TP53, TSC1, TSC2, VHL, WRN, WT1).  Since the current test is not perfect, it is possible that there may be a gene mutation that current testing cannot detect, but that chance is small. It is possible that a different genetic factor, which has not yet been discovered or is not on this panel, is responsible for the cancer diagnoses in the family. Again, the likelihood of this is low. No additional testing is recommended at this time for Cindy Schwartz.  Cancer Screening: These results suggest that [name]'s cancer was most likely not due to an inherited predisposition. Most cancers happen by chance and this test, along with details of her family history, suggests that her cancer falls into this category. We discussed continuing to follow the cancer screening guidelines provided by her physician.   Family Members: Family members are at some increased risk of developing cancer, over the general population risk, simply due to the family history. Women are recommended to have a yearly mammogram beginning at age 73, a yearly clinical breast exam, a yearly gynecologic exam and perform monthly breast self-exams. Colon cancer screening is recommended to begin by age 74 in both men and women, unless there is a family history of colon cancer or colon polyps or an individual has a personal history to warrant initiating screening at a younger age.  Any relative who had cancer at a young age or had a particularly rare cancer may also wish to pursue genetic testing. Genetic counselors can be located in other cities, by visiting the website of the [employer] (Artist[employer].se) and Field seismologist for a Dietitian by zip code.    Lastly, cancer genetics is a rapidly advancing field and it is possible that new genetic tests will be appropriate for Cindy Schwartz in the future. We encourage her to remain in contact with [place] on an annual basis so we can update her personal and family histories, and let her know of advances in cancer genetics that may benefit the family. Our contact number was provided. Cindy Schwartz is welcome to call anytime  with additional questions.     Steele Berg, MS, [place] Certified Genetic Counselor phone: [phone]

## 2017-09-16 ENCOUNTER — Encounter: Payer: Self-pay | Admitting: Adult Health

## 2017-09-16 ENCOUNTER — Ambulatory Visit (INDEPENDENT_AMBULATORY_CARE_PROVIDER_SITE_OTHER): Payer: Medicare Other | Admitting: Adult Health

## 2017-09-16 ENCOUNTER — Other Ambulatory Visit: Payer: Self-pay | Admitting: Adult Health

## 2017-09-16 ENCOUNTER — Telehealth: Payer: Self-pay | Admitting: Internal Medicine

## 2017-09-16 VITALS — BP 116/60 | Temp 98.0°F | Wt 146.0 lb

## 2017-09-16 DIAGNOSIS — J0100 Acute maxillary sinusitis, unspecified: Secondary | ICD-10-CM

## 2017-09-16 MED ORDER — DOXYCYCLINE HYCLATE 100 MG PO CAPS: 100.0000 mg | ORAL_CAPSULE | Freq: Two times a day (BID) | ORAL | 0 refills | Status: DC

## 2017-09-16 MED ORDER — FLUTICASONE PROPIONATE 50 MCG/ACT NA SUSP: 2.0000 | Freq: Every day | NASAL | 6 refills | Status: DC

## 2017-09-16 NOTE — Telephone Encounter (Signed)
Copied from [place] [id number]. Topic: Quick Communication - Rx Refill/Question >> Sep 16, 2017  3:59 PM Marin Olp L wrote: Has the patient contacted their pharmacy? No. (seen today and has additional req)   (Agent: If no, request that the patient contact the pharmacy for the refill.)   Preferred Pharmacy (with phone number or street name): [facility], [place] - [address] AT [address]   Agent: Please be advised that RX refills may take up to 3 business days. We ask that you follow-up with your pharmacy. New request for flonase but not the generic.Saw Cory N today and he prescribed antibiotics for sinus infection.

## 2017-09-16 NOTE — Telephone Encounter (Signed)
You have not prescribed this medication.  Please advise.

## 2017-09-16 NOTE — Telephone Encounter (Signed)
See patient's request below for flonase / OV today with [facility] for sinus issues /

## 2017-09-16 NOTE — Progress Notes (Signed)
Subjective:    Patient ID: Cindy Schwartz, female    DOB: Jan 27, 1934, 83 y.o.   MRN: [id number]  Sinusitis  This is a new problem. The current episode started in the past 7 days. The problem has been gradually worsening since onset. Maximum temperature: subjective fever  Associated symptoms include chills, congestion, coughing (Cough ), sinus pressure and a sore throat. Pertinent negatives include no ear pain or shortness of breath.      Review of Systems  Constitutional: Positive for activity change, chills, fatigue and fever.  HENT: Positive for congestion, sinus pressure, sinus pain and sore throat. Negative for ear discharge, ear pain and trouble swallowing.   Eyes: Negative.   Respiratory: Positive for cough (Cough ). Negative for shortness of breath and wheezing.   Cardiovascular: Negative.    Past Medical History:  Diagnosis Date  . Aneurysm of ascending aorta ([place])    a. 4.2-4.3cm in 2014.  Marland Kitchen Anxiety   . Breast cancer ([place]) 2018   right mastectomy  . Cancer ([place]) 1980   ovarian cancer  . Elevated BP Transient 09/2014  . Genetic testing 07/09/2017   Multi-Cancer panel (83 genes) @ [facility] - No pathogenic mutations detected  . GERD (gastroesophageal reflux disease)   . H/O bone density study    2015  . H/O colonoscopy   . History of ovarian cancer    November 1979  . 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 Insomnia   . Normal coronary arteries Cath - 09/2014  . Osteoporosis   . Raynaud's disease     Social History   Socioeconomic History  . Marital status: Married    Spouse name: Cindy Schwartz  . Number of children: 2  . Years of education: Not on file  . Highest education level: Not on file  Social Needs  . Financial resource strain: Not on file  . Food insecurity - worry: Not on file  . Food insecurity - inability: Not on file  . Transportation needs - medical: Not on file  . Transportation needs -  non-medical: Not on file  Occupational History  . Occupation: retired    Fish farm manager: RETIRED  Tobacco Use  . Smoking status: Never Smoker  . Smokeless tobacco: Never Used  Substance and Sexual Activity  . Alcohol use: No  . Drug use: No  . Sexual activity: Not on file  Other Topics Concern  . Not on file  Social History Narrative   She and her husband are from the [place] of [place].`   Children: Cindy Schwartz age 61, lives in [place], [place], age 12, lives in [place], [place]    Past Surgical History:  Procedure Laterality Date  . ABDOMINAL HYSTERECTOMY    . APPENDECTOMY    . BILATERAL SALPINGOOPHORECTOMY    . BREAST LUMPECTOMY    . CARDIAC CATHETERIZATION    . LEFT HEART CATHETERIZATION WITH CORONARY ANGIOGRAM N/A 10/17/2014   Procedure: LEFT HEART CATHETERIZATION WITH CORONARY ANGIOGRAM;  Surgeon: [name], MD;  Location: [facility] CATH LAB;  Service: Cardiovascular;  Laterality: N/A;  . MASTECTOMY W/ SENTINEL NODE BIOPSY Right 05/05/2017   total mastectomy  . ovarian ca s/p taj/bso  1979  . SIMPLE MASTECTOMY WITH AXILLARY SENTINEL NODE BIOPSY Right 05/05/2017   Procedure: RIGHT TOTAL MASTECTOMY WITH RIGHT  AXILLARY SENTINEL NODE BIOPSY;  Surgeon: [name], MD;  Location: [facility];  Service: General;  Laterality: Right;  PECTORAL  BLOCK    Family History  Problem Relation Age of Onset  . Healthy Mother   . Healthy Brother   . Breast cancer Cousin 55       mat first cousin related through uncle  . Esophageal cancer Cousin 73       mat first cousin related through uncle    Allergies  Allergen Reactions  . Chlorhexidine Gluconate Rash    Current Outpatient Medications on File Prior to Visit  Medication Sig Dispense Refill  . CALCIUM-MAG-VIT C-VIT D PO Take 15 mLs by mouth daily.    Marland Kitchen NEXIUM 40 MG capsule TAKE 1 CAPSULE DAILY (Patient taking differently: TAKE 1 CAPSULE EVERY OTHER DAY) 90 capsule 3  . Omega-3 Fatty Acids (FISH OIL PO) Take  5 mLs by mouth daily.    . valACYclovir (VALTREX) 500 MG tablet Take 500 mg by mouth 2 (two) times a week.    . traMADol (ULTRAM) 50 MG tablet Take 1 tablet (50 mg total) by mouth every 6 (six) hours as needed for moderate pain. (Patient not taking: Reported on 09/16/2017) 15 tablet 0   No current facility-administered medications on file prior to visit.     BP 116/60 (BP Location: Left Arm)   Temp 98 F (36.7 C) (Oral)   Wt 146 lb (66.2 kg)   BMI 22.87 kg/m       Objective:   Physical Exam  Constitutional: She appears well-developed and well-nourished. No distress.  HENT:  Head: Normocephalic and atraumatic.  Right Ear: Hearing, tympanic membrane, external ear and ear canal normal.  Left Ear: Hearing, tympanic membrane, external ear and ear canal normal.  Nose: Mucosal edema and rhinorrhea present. Right sinus exhibits maxillary sinus tenderness. Right sinus exhibits no frontal sinus tenderness. Left sinus exhibits maxillary sinus tenderness. Left sinus exhibits no frontal sinus tenderness.  Mouth/Throat: Uvula is midline, oropharynx is clear and moist and mucous membranes are normal. No oropharyngeal exudate.  Neck: Neck supple.  Cardiovascular: Normal rate, regular rhythm, normal heart sounds and intact distal pulses. Exam reveals no gallop and no friction rub.  No murmur heard. Pulmonary/Chest: Effort normal and breath sounds normal. No respiratory distress. She has no wheezes. She has no rales. She exhibits no tenderness.  Lymphadenopathy:    She has no cervical adenopathy.  Skin: She is not diaphoretic.  Nursing note and vitals reviewed.     Assessment & Plan:  1. Acute non-recurrent maxillary sinusitis - doxycycline (VIBRAMYCIN) 100 MG capsule; Take 1 capsule (100 mg total) by mouth 2 (two) times daily.  Dispense: 14 capsule; Refill: 0 - [place] for cough  - Follow up if no improvement in the next 2-3 days   Dorothyann Peng, NP

## 2017-09-21 ENCOUNTER — Telehealth: Payer: Self-pay | Admitting: *Deleted

## 2017-09-21 NOTE — Telephone Encounter (Signed)
Patient Name: Cindy Schwartz Gender: Female DOB: [birth year] Age: 83 Y 85 M 3 D Return Phone Number: [phone] (Primary) Address: City/State/Zip: [place] [address] Client [facility] - Client Client Site [facility] - Night Physician Simonne Martinet - MD Contact Type Call Who Is Calling Patient / Member / Family / Caregiver Call Type Triage / Clinical Relationship To Patient Self Return Phone Number [phone] (Primary) Chief Complaint Cough Reason for Call Symptomatic / Request for [place] states she was seen Wed and given abx for sinus. Is not feeling better, only worse. Has an annoying dry cough. Translation No Nurse Assessment Guidelines Guideline Title Affirmed Question Affirmed Notes Nurse Date/Time (Eastern Time) Disp. Time Eilene Ghazi Time) Disposition Final User 09/19/2017 2:28:28 PM Attempt made - message left Theodoro Grist 09/19/2017 2:45:57 PM FINAL ATTEMPT MADE - no message left Yes Gareth Eagle, RN, Raquel Sarna Comments User: Tilden Fossa, RN Date/Time Eilene Ghazi Time): 09/19/2017 2:48:07 PM No message left on 2nd attempt - name on voicemail different than that of pt listed on record.

## 2017-09-21 NOTE — Telephone Encounter (Signed)
Patient has appt scheduled for 09/23/17.

## 2017-09-23 ENCOUNTER — Encounter: Payer: Self-pay | Admitting: Adult Health

## 2017-09-23 ENCOUNTER — Ambulatory Visit (INDEPENDENT_AMBULATORY_CARE_PROVIDER_SITE_OTHER): Payer: Medicare Other | Admitting: Adult Health

## 2017-09-23 ENCOUNTER — Ambulatory Visit (INDEPENDENT_AMBULATORY_CARE_PROVIDER_SITE_OTHER)
Admission: RE | Admit: 2017-09-23 | Discharge: 2017-09-23 | Disposition: A | Discharge status: Home / Self Care | Payer: Medicare Other | Source: Ambulatory Visit | Attending: Adult Health | Admitting: Adult Health

## 2017-09-23 VITALS — BP 124/74 | Temp 97.7°F

## 2017-09-23 DIAGNOSIS — R05 Cough: Secondary | ICD-10-CM | POA: Diagnosis not present

## 2017-09-23 DIAGNOSIS — R059 Cough, unspecified: Secondary | ICD-10-CM

## 2017-09-23 NOTE — Progress Notes (Signed)
Subjective:    Patient ID: Cindy Schwartz, female    DOB: Feb 07, 1934, 84 y.o.   MRN: [id number]  HPI  84 year old female who  has a past medical history of Aneurysm of ascending aorta ([place]), Anxiety, Breast cancer ([place]) (2018), Cancer ([place]) (1980), Elevated BP (Transient 09/2014), Genetic testing (07/09/2017), GERD (gastroesophageal reflux disease), H/O bone density study, H/O colonoscopy, History of ovarian cancer, History of ovarian cancer (1979), Insomnia, Normal coronary arteries (Cath - 09/2014), Osteoporosis, and Raynaud's disease.  She presents for follow up after being treated for sinusitis one week ago. She was prescribed a 7 day course of Doxycyline and advised Delsym for cough. Today in the office she reports she feels better but continues to have a constant dry cough ( which is also improving), but she would like to make sure " nothing is going on with my lungs".  She denies any fevers, SOB, or wheezing.   Review of Systems See HPI   Past Medical History:  Diagnosis Date  . Aneurysm of ascending aorta ([place])    a. 4.2-4.3cm in 2014.  Marland Kitchen Anxiety   . Breast cancer ([place]) 2018   right mastectomy  . Cancer ([place]) 1980   ovarian cancer  . Elevated BP Transient 09/2014  . Genetic testing 07/09/2017   Multi-Cancer panel (83 genes) @ [facility] - No pathogenic mutations detected  . GERD (gastroesophageal reflux disease)   . H/O bone density study    2015  . H/O colonoscopy   . History of ovarian cancer    November 1979  . 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 Insomnia   . Normal coronary arteries Cath - 09/2014  . Osteoporosis   . Raynaud's disease     Social History   Socioeconomic History  . Marital status: Married    Spouse name: Publishing copy  . Number of children: 2  . Years of education: Not on file  . Highest education level: Not on file  Social Needs  . Financial resource strain: Not on file  . Food insecurity -  worry: Not on file  . Food insecurity - inability: Not on file  . Transportation needs - medical: Not on file  . Transportation needs - non-medical: Not on file  Occupational History  . Occupation: retired    Fish farm manager: RETIRED  Tobacco Use  . Smoking status: Never Smoker  . Smokeless tobacco: Never Used  Substance and Sexual Activity  . Alcohol use: No  . Drug use: No  . Sexual activity: Not on file  Other Topics Concern  . Not on file  Social History Narrative   She and her husband are from the [place] of [place].`   Children: [name] Bokhari age 44, lives in [place], [place], age 14, lives in [place], [place]    Past Surgical History:  Procedure Laterality Date  . ABDOMINAL HYSTERECTOMY    . APPENDECTOMY    . BILATERAL SALPINGOOPHORECTOMY    . BREAST LUMPECTOMY    . CARDIAC CATHETERIZATION    . LEFT HEART CATHETERIZATION WITH CORONARY ANGIOGRAM N/A 10/17/2014   Procedure: LEFT HEART CATHETERIZATION WITH CORONARY ANGIOGRAM;  Surgeon: [name], MD;  Location: [facility] CATH LAB;  Service: Cardiovascular;  Laterality: N/A;  . MASTECTOMY W/ SENTINEL NODE BIOPSY Right 05/05/2017   total mastectomy  . ovarian ca s/p taj/bso  1979  . SIMPLE MASTECTOMY WITH AXILLARY SENTINEL NODE BIOPSY Right 05/05/2017  Procedure: RIGHT TOTAL MASTECTOMY WITH RIGHT  AXILLARY SENTINEL NODE BIOPSY;  Surgeon: [name], MD;  Location: [facility];  Service: General;  Laterality: Right;  PECTORAL BLOCK    Family History  Problem Relation Age of Onset  . Healthy Mother   . Healthy Brother   . Breast cancer Cousin 1       mat first cousin related through uncle  . Esophageal cancer Cousin 38       mat first cousin related through uncle    Allergies  Allergen Reactions  . Chlorhexidine Gluconate Rash    Current Outpatient Medications on File Prior to Visit  Medication Sig Dispense Refill  . CALCIUM-MAG-VIT C-VIT D PO Take 15 mLs by mouth daily.    . fluticasone  (FLONASE) 50 MCG/ACT nasal spray Place 2 sprays into both nostrils daily. 16 g 6  . NEXIUM 40 MG capsule TAKE 1 CAPSULE DAILY (Patient taking differently: TAKE 1 CAPSULE EVERY OTHER DAY) 90 capsule 3  . Omega-3 Fatty Acids (FISH OIL PO) Take 5 mLs by mouth daily.    . traMADol (ULTRAM) 50 MG tablet Take 1 tablet (50 mg total) by mouth every 6 (six) hours as needed for moderate pain. 15 tablet 0  . valACYclovir (VALTREX) 500 MG tablet Take 500 mg by mouth 2 (two) times a week.     No current facility-administered medications on file prior to visit.     BP 124/74 (BP Location: Left Arm)   Temp 97.7 F (36.5 C) (Oral)       Objective:   Physical Exam  Constitutional: She is oriented to person, place, and time. She appears well-developed and well-nourished. No distress.  HENT:  Right Ear: Hearing, tympanic membrane, external ear and ear canal normal.  Left Ear: Hearing, tympanic membrane, external ear and ear canal normal.  Nose: Nose normal. Right sinus exhibits no maxillary sinus tenderness and no frontal sinus tenderness. Left sinus exhibits no maxillary sinus tenderness and no frontal sinus tenderness.  Mouth/Throat: Uvula is midline and oropharynx is clear and moist.  Cardiovascular: Normal rate, regular rhythm, normal heart sounds and intact distal pulses. Exam reveals no gallop and no friction rub.  No murmur heard. Pulmonary/Chest: Effort normal and breath sounds normal. No respiratory distress. She has no wheezes. She has no rales. She exhibits no tenderness.  Dry cough    Neurological: She is alert and oriented to person, place, and time.  Skin: Skin is warm and dry. No rash noted. She is not diaphoretic. No erythema. No pallor.  Psychiatric: She has a normal mood and affect. Her behavior is normal. Thought content normal.  Nursing note and vitals reviewed.     Assessment & Plan:  1. Cough - Advised tessalon pearls or prednisone course. She refused both at this time  - DG  Chest 2 View; Future - Follow up as needed  Dorothyann Peng, NP

## 2017-10-01 ENCOUNTER — Telehealth: Payer: Self-pay | Admitting: Internal Medicine

## 2017-10-01 NOTE — Telephone Encounter (Signed)
Okay for transfer 

## 2017-10-01 NOTE — Telephone Encounter (Signed)
Patient would like to transfer care to Dr. Yong Channel from [facility]. Please respond with an approval or denial of the transfer. Out of courtesy for both providers we will await a response before contacting the patient with an update.

## 2017-10-01 NOTE — Telephone Encounter (Signed)
I am not accepting new medicare patients unless I already care for family members. Is this the wife of a current patient? If not can see one of other providers here or at [facility]

## 2017-10-02 ENCOUNTER — Ambulatory Visit (INDEPENDENT_AMBULATORY_CARE_PROVIDER_SITE_OTHER): Payer: Medicare Other | Admitting: Family Medicine

## 2017-10-02 ENCOUNTER — Encounter: Payer: Self-pay | Admitting: Family Medicine

## 2017-10-02 VITALS — BP 104/76 | HR 80 | Temp 97.9°F | Ht 67.0 in | Wt 145.3 lb

## 2017-10-02 DIAGNOSIS — R0600 Dyspnea, unspecified: Secondary | ICD-10-CM | POA: Diagnosis not present

## 2017-10-02 DIAGNOSIS — R059 Cough, unspecified: Secondary | ICD-10-CM

## 2017-10-02 DIAGNOSIS — R05 Cough: Secondary | ICD-10-CM | POA: Diagnosis not present

## 2017-10-02 LAB — POC INFLUENZA A&B (BINAX/QUICKVUE)
Influenza A, POC: NEGATIVE
Influenza B, POC: NEGATIVE

## 2017-10-02 MED ORDER — ALBUTEROL SULFATE HFA 108 (90 BASE) MCG/ACT IN AERS: 2.0000 | INHALATION_SPRAY | Freq: Four times a day (QID) | RESPIRATORY_TRACT | 0 refills | Status: DC | PRN

## 2017-10-02 MED ORDER — BENZONATATE 100 MG PO CAPS: 100.0000 mg | ORAL_CAPSULE | Freq: Two times a day (BID) | ORAL | 0 refills | Status: DC | PRN

## 2017-10-02 NOTE — Progress Notes (Signed)
HPI:  Acute visit for respiratory illness: -started: 2 days ago -reports had sinus in fection in December - completely resolved, then these symptoms started -symptoms:nasal congestion, pndt, cough, a little achy today -denies:fever, SOB, NVD, tooth pain, wheezing -has tried: nothing -sick contacts/travel/risks: no reported flu, strep or tick exposure -symptoms are mild, but she is worried because she had a CXR a few weeks ago and she reports she was told she has a "little of something" and she did not know what that meant -no using the flonase  ROS: See pertinent positives and negatives per HPI.  Past Medical History:  Diagnosis Date  . Aneurysm of ascending aorta ([place])    a. 4.2-4.3cm in 2014.  Marland Kitchen Anxiety   . Breast cancer ([place]) 2018   right mastectomy  . Cancer ([place]) 1980   ovarian cancer  . Elevated BP Transient 09/2014  . Genetic testing 07/09/2017   Multi-Cancer panel (83 genes) @ [facility] - No pathogenic mutations detected  . GERD (gastroesophageal reflux disease)   . H/O bone density study    2015  . H/O colonoscopy   . History of ovarian cancer    November 1979  . 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 Insomnia   . Normal coronary arteries Cath - 09/2014  . Osteoporosis   . Raynaud's disease     Past Surgical History:  Procedure Laterality Date  . ABDOMINAL HYSTERECTOMY    . APPENDECTOMY    . BILATERAL SALPINGOOPHORECTOMY    . BREAST LUMPECTOMY    . CARDIAC CATHETERIZATION    . LEFT HEART CATHETERIZATION WITH CORONARY ANGIOGRAM N/A 10/17/2014   Procedure: LEFT HEART CATHETERIZATION WITH CORONARY ANGIOGRAM;  Surgeon: [name], MD;  Location: [facility] CATH LAB;  Service: Cardiovascular;  Laterality: N/A;  . MASTECTOMY W/ SENTINEL NODE BIOPSY Right 05/05/2017   total mastectomy  . ovarian ca s/p taj/bso  1979  . SIMPLE MASTECTOMY WITH AXILLARY SENTINEL NODE BIOPSY Right 05/05/2017   Procedure: RIGHT  TOTAL MASTECTOMY WITH RIGHT  AXILLARY SENTINEL NODE BIOPSY;  Surgeon: [name], MD;  Location: [facility];  Service: General;  Laterality: Right;  PECTORAL BLOCK    Family History  Problem Relation Age of Onset  . Healthy Mother   . Healthy Brother   . Breast cancer Cousin 22       mat first cousin related through uncle  . Esophageal cancer Cousin 67       mat first cousin related through uncle    Social History   Socioeconomic History  . Marital status: Married    Spouse name: Publishing copy  . Number of children: 2  . Years of education: None  . Highest education level: None  Social Needs  . Financial resource strain: None  . Food insecurity - worry: None  . Food insecurity - inability: None  . Transportation needs - medical: None  . Transportation needs - non-medical: None  Occupational History  . Occupation: retired    Fish farm manager: RETIRED  Tobacco Use  . Smoking status: Never Smoker  . Smokeless tobacco: Never Used  Substance and Sexual Activity  . Alcohol use: No  . Drug use: No  . Sexual activity: None  Other Topics Concern  . None  Social History Narrative   She and her husband are from the [place] of [place].`   Children: [name] Bedore age 81, lives in [place], [place], age 53, lives in  [place], [place]     Current Outpatient Medications:  .  albuterol (PROVENTIL HFA;VENTOLIN HFA) 108 (90 Base) MCG/ACT inhaler, Inhale 2 puffs into the lungs every 6 (six) hours as needed., Disp: 1 Inhaler, Rfl: 0 .  benzonatate (TESSALON) 100 MG capsule, Take 1 capsule (100 mg total) by mouth 2 (two) times daily as needed for cough., Disp: 20 capsule, Rfl: 0 .  CALCIUM-MAG-VIT C-VIT D PO, Take 15 mLs by mouth daily., Disp: , Rfl:  .  fluticasone (FLONASE) 50 MCG/ACT nasal spray, Place 2 sprays into both nostrils daily., Disp: 16 g, Rfl: 6 .  NEXIUM 40 MG capsule, TAKE 1 CAPSULE DAILY (Patient taking differently: TAKE 1 CAPSULE EVERY OTHER DAY), Disp: 90  capsule, Rfl: 3 .  Omega-3 Fatty Acids (FISH OIL PO), Take 5 mLs by mouth daily., Disp: , Rfl:  .  valACYclovir (VALTREX) 500 MG tablet, Take 500 mg by mouth 2 (two) times a week., Disp: , Rfl:   EXAM:  Vitals:   10/02/17 0902  BP: 104/76  Pulse: 80  Temp: 97.9 F (36.6 C)  SpO2: 98%    Body mass index is 22.76 kg/m.  GENERAL: vitals reviewed and listed above, alert, oriented, appears well hydrated and in no acute distress  HEENT: atraumatic, conjunttiva clear, no obvious abnormalities on inspection of external nose and ears, normal appearance of ear canals and TMs, clear nasal congestion, mild post oropharyngeal erythema with PND, no tonsillar edema or exudate, no sinus TTP  NECK: no obvious masses on inspection  LUNGS: clear to auscultation bilaterally, no wheezes, rales or rhonchi, good air movement,  CV: HRRR, no peripheral edema  MS: moves all extremities without noticeable abnormality  PSYCH: pleasant and cooperative, no obvious depression or anxiety  ASSESSMENT AND PLAN:  Discussed the following assessment and plan:  Cough - Plan: POC Influenza A&B(BINAX/QUICKVUE)  PND (paroxysmal nocturnal dyspnea)  -given HPI and exam findings today, a serious infection or illness is unlikely. We discussed potential etiologies, with VURI, allergies, mild COPD or GERD being most likely. -discussed what COPD is, alb to use prn and follow up with PCP in 4 weeks - may need prednisone if cough not improving with other measures but she opted against this today after discussion options -start flonase daily -tessalon for cough after discussion risks as she wanted something rx for this -she wants "test for the flu" - symptoms seem mild for the flu but rapid flu test done per her wishes, negative -of course, we advised to return or notify a doctor sonner if symptoms worsen or persist or new concerns arise.    Patient Instructions  BEFORE YOU LEAVE: -flu test -follow up: recheck with  PCP in 4-6 weeks  Start flonase 2 sprays each nostril daily for 4 weeks, then 1 spray each nostril daily.  Albuterol as needed per instructions.  Tessalon for cough as needed. Keep out of reach of children. Take per instructions.  I hope you are feeling better soon! Seek care promptly if your symptoms worsen, new concerns arise or you are not improving with treatment.      Colin Benton R., DO

## 2017-10-02 NOTE — Patient Instructions (Signed)
BEFORE YOU LEAVE: -flu test -follow up: recheck with PCP in 4-6 weeks  Start flonase 2 sprays each nostril daily for 4 weeks, then 1 spray each nostril daily.  Albuterol as needed per instructions.  Tessalon for cough as needed. Keep out of reach of children. Take per instructions.  I hope you are feeling better soon! Seek care promptly if your symptoms worsen, new concerns arise or you are not improving with treatment.

## 2017-10-05 NOTE — Telephone Encounter (Signed)
I have called and left a voicemail notifying the patient that Dr. Yong Channel is not accepting any Medicare patient's at this time, his medicare panel is full. I asked the patient to cal back if she would like to put in a request to transfer to another provider at [facility], remain with Dr. Burnice Logan or transfer to another [facility] provider. Awaiting patient's call.

## 2017-10-06 ENCOUNTER — Encounter: Payer: Self-pay | Admitting: Internal Medicine

## 2017-10-06 ENCOUNTER — Ambulatory Visit (INDEPENDENT_AMBULATORY_CARE_PROVIDER_SITE_OTHER): Payer: Medicare Other | Admitting: Internal Medicine

## 2017-10-06 ENCOUNTER — Telehealth: Payer: Self-pay | Admitting: Internal Medicine

## 2017-10-06 VITALS — BP 120/70 | HR 74 | Temp 98.1°F | Ht 67.0 in | Wt 145.2 lb

## 2017-10-06 DIAGNOSIS — B9789 Other viral agents as the cause of diseases classified elsewhere: Secondary | ICD-10-CM | POA: Diagnosis not present

## 2017-10-06 DIAGNOSIS — J302 Other seasonal allergic rhinitis: Secondary | ICD-10-CM | POA: Diagnosis not present

## 2017-10-06 DIAGNOSIS — R300 Dysuria: Secondary | ICD-10-CM

## 2017-10-06 DIAGNOSIS — M818 Other osteoporosis without current pathological fracture: Secondary | ICD-10-CM | POA: Diagnosis not present

## 2017-10-06 DIAGNOSIS — J069 Acute upper respiratory infection, unspecified: Secondary | ICD-10-CM

## 2017-10-06 LAB — POCT URINALYSIS DIPSTICK
BILIRUBIN UA: NEGATIVE
GLUCOSE UA: NEGATIVE
KETONES UA: NEGATIVE
Leukocytes, UA: NEGATIVE
Nitrite, UA: NEGATIVE
Odor: NEGATIVE
Protein, UA: NEGATIVE
SPEC GRAV UA: 1.015 (ref 1.010–1.025)
Urobilinogen, UA: 0.2 E.U./dL
pH, UA: 6.5 (ref 5.0–8.0)

## 2017-10-06 NOTE — Telephone Encounter (Signed)
Yes okay to transfer

## 2017-10-06 NOTE — Telephone Encounter (Signed)
Pt would like to transfer to Dr. Yong Channel and would like to see if Dr. Raliegh Ip would release her to go to [facility] to see Dr. Yong Channel.  Dr. Yong Channel state it is okay for her to do so.

## 2017-10-06 NOTE — Progress Notes (Signed)
Subjective:    Patient ID: Cindy Schwartz, female    DOB: 09/04/1934, 84 y.o.   MRN: [id number]  HPI  84 year old patient who is seen today for follow-up.  She has been seen recently by colleagues for URI with cough.  A chest x-ray was obtained that revealed hyperexpansion of the lungs and she was given a radiologic diagnosis of COPD.  She is a lifelong non-smoker.  She has some minor cough but seems to be improving.  She is quite anxious about possible COPD.  Past Medical History:  Diagnosis Date  . Aneurysm of ascending aorta ([place])    a. 4.2-4.3cm in 2014.  Marland Kitchen Anxiety   . Breast cancer ([place]) 2018   right mastectomy  . Cancer ([place]) 1980   ovarian cancer  . Elevated BP Transient 09/2014  . Genetic testing 07/09/2017   Multi-Cancer panel (83 genes) @ [facility] - No pathogenic mutations detected  . GERD (gastroesophageal reflux disease)   . H/O bone density study    2015  . H/O colonoscopy   . History of ovarian cancer    November 1979  . 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 Insomnia   . Normal coronary arteries Cath - 09/2014  . Osteoporosis   . Raynaud's disease      Social History   Socioeconomic History  . Marital status: Married    Spouse name: Publishing copy  . Number of children: 2  . Years of education: Not on file  . Highest education level: Not on file  Social Needs  . Financial resource strain: Not on file  . Food insecurity - worry: Not on file  . Food insecurity - inability: Not on file  . Transportation needs - medical: Not on file  . Transportation needs - non-medical: Not on file  Occupational History  . Occupation: retired    Fish farm manager: RETIRED  Tobacco Use  . Smoking status: Never Smoker  . Smokeless tobacco: Never Used  Substance and Sexual Activity  . Alcohol use: No  . Drug use: No  . Sexual activity: Not on file  Other Topics Concern  . Not on file  Social History Narrative   She and her  husband are from the [place].`   Children: [name] Pascuzzi age 54, lives in [place], [place], age 7, lives in [place], [place]    Past Surgical History:  Procedure Laterality Date  . ABDOMINAL HYSTERECTOMY    . APPENDECTOMY    . BILATERAL SALPINGOOPHORECTOMY    . BREAST LUMPECTOMY    . CARDIAC CATHETERIZATION    . LEFT HEART CATHETERIZATION WITH CORONARY ANGIOGRAM N/A 10/17/2014   Procedure: LEFT HEART CATHETERIZATION WITH CORONARY ANGIOGRAM;  Surgeon: [name], MD;  Location: [facility] CATH LAB;  Service: Cardiovascular;  Laterality: N/A;  . MASTECTOMY W/ SENTINEL NODE BIOPSY Right 05/05/2017   total mastectomy  . ovarian ca s/p taj/bso  1979  . SIMPLE MASTECTOMY WITH AXILLARY SENTINEL NODE BIOPSY Right 05/05/2017   Procedure: RIGHT TOTAL MASTECTOMY WITH RIGHT  AXILLARY SENTINEL NODE BIOPSY;  Surgeon: [name], MD;  Location: [facility];  Service: General;  Laterality: Right;  PECTORAL BLOCK    Family History  Problem Relation Age of Onset  . Healthy Mother   . Healthy Brother   . Breast cancer Cousin 31       mat first cousin related through uncle  . Esophageal cancer Cousin 26  mat first cousin related through uncle    Allergies  Allergen Reactions  . Chlorhexidine Gluconate Rash    Current Outpatient Medications on File Prior to Visit  Medication Sig Dispense Refill  . CALCIUM-MAG-VIT C-VIT D PO Take 15 mLs by mouth daily.    . fluticasone (FLONASE) 50 MCG/ACT nasal spray Place 2 sprays into both nostrils daily. 16 g 6  . NEXIUM 40 MG capsule TAKE 1 CAPSULE DAILY (Patient taking differently: TAKE 1 CAPSULE EVERY OTHER DAY) 90 capsule 3  . Omega-3 Fatty Acids (FISH OIL PO) Take 5 mLs by mouth daily.    . valACYclovir (VALTREX) 500 MG tablet Take 500 mg by mouth 2 (two) times a week.    Marland Kitchen albuterol (PROVENTIL HFA;VENTOLIN HFA) 108 (90 Base) MCG/ACT inhaler Inhale 2 puffs into the lungs every 6 (six) hours as needed. (Patient not  taking: Reported on 10/06/2017) 1 Inhaler 0  . benzonatate (TESSALON) 100 MG capsule Take 1 capsule (100 mg total) by mouth 2 (two) times daily as needed for cough. (Patient not taking: Reported on 10/06/2017) 20 capsule 0   No current facility-administered medications on file prior to visit.     BP 120/70 (BP Location: Left Arm, Patient Position: Sitting, Cuff Size: Normal)   Pulse 74   Temp 98.1 F (36.7 C) (Oral)   Ht 5\' 7"  (1.702 m)   Wt 145 lb 3.2 oz (65.9 kg)   SpO2 98%   BMI 22.74 kg/m     Review of Systems  Constitutional: Negative.   HENT: Negative for congestion, dental problem, hearing loss, rhinorrhea, sinus pressure, sore throat and tinnitus.   Eyes: Negative for pain, discharge and visual disturbance.  Respiratory: Positive for cough. Negative for shortness of breath.   Cardiovascular: Negative for chest pain, palpitations and leg swelling.  Gastrointestinal: Negative for abdominal distention, abdominal pain, blood in stool, constipation, diarrhea, nausea and vomiting.  Genitourinary: Negative for difficulty urinating, dysuria, flank pain, frequency, hematuria, pelvic pain, urgency, vaginal bleeding, vaginal discharge and vaginal pain.  Musculoskeletal: Negative for arthralgias, gait problem and joint swelling.  Skin: Negative for rash.  Neurological: Negative for dizziness, syncope, speech difficulty, weakness, numbness and headaches.  Hematological: Negative for adenopathy.  Psychiatric/Behavioral: Negative for agitation, behavioral problems and dysphoric mood. The patient is not nervous/anxious.        Objective:   Physical Exam  Constitutional: She is oriented to person, place, and time. She appears well-developed and well-nourished.  HENT:  Head: Normocephalic.  Right Ear: External ear normal.  Left Ear: External ear normal.  Mouth/Throat: Oropharynx is clear and moist.  Eyes: Conjunctivae and EOM are normal. Pupils are equal, round, and reactive to light.    Neck: Normal range of motion. Neck supple. No thyromegaly present.  Cardiovascular: Normal rate, regular rhythm, normal heart sounds and intact distal pulses.  Pulmonary/Chest: Effort normal and breath sounds normal. No respiratory distress. She has no wheezes. She has no rales.  Abdominal: Soft. Bowel sounds are normal. She exhibits no mass. There is no tenderness.  Musculoskeletal: Normal range of motion.  Lymphadenopathy:    She has no cervical adenopathy.  Neurological: She is alert and oriented to person, place, and time.  Skin: Skin is warm and dry. No rash noted.  Psychiatric: She has a normal mood and affect. Her behavior is normal.          Assessment & Plan:   Viral URI with cough.  Improving Chronic hyperexpansion of the lungs.  This was discussed  at length.  Prior chest x-rays were reviewed with the patient that are basically unchanged.  Patient was not given a diagnosis of COPD previously.  She was reassured  Follow-up as scheduled Continue symptomatic treatment for cough  Nyoka Cowden

## 2017-10-06 NOTE — Patient Instructions (Signed)
Call or return to clinic prn if these symptoms worsen or fail to improve as anticipated.

## 2017-11-18 ENCOUNTER — Ambulatory Visit: Payer: Medicare Other | Admitting: Family Medicine

## 2017-11-19 ENCOUNTER — Encounter: Payer: Self-pay | Admitting: Podiatry

## 2017-11-19 ENCOUNTER — Ambulatory Visit (INDEPENDENT_AMBULATORY_CARE_PROVIDER_SITE_OTHER): Payer: Medicare Other | Admitting: Podiatry

## 2017-11-19 DIAGNOSIS — M79671 Pain in right foot: Secondary | ICD-10-CM | POA: Diagnosis not present

## 2017-11-19 DIAGNOSIS — Q828 Other specified congenital malformations of skin: Secondary | ICD-10-CM

## 2017-11-19 DIAGNOSIS — M79672 Pain in left foot: Secondary | ICD-10-CM

## 2017-11-19 DIAGNOSIS — D2371 Other benign neoplasm of skin of right lower limb, including hip: Secondary | ICD-10-CM

## 2017-11-19 NOTE — Progress Notes (Signed)
Subjective: Cindy Schwartz presents the office today for concerns of painful lesions to the ball of both of her feet pointing submetatarsal 2 with a left side worse than the right.  We last saw him in 2017.  She states the areas recurred and causing pain with pressure in shoes.  Denies any recent injury or trauma.  No swelling or redness.  No drainage.  She has no other concerns today. Denies any systemic complaints such as fevers, chills, nausea, vomiting. No acute changes since last appointment, and no other complaints at this time.   Objective: AAO x3, NAD DP/PT pulses palpable bilaterally, CRT less than 3 seconds Hyperkeratotic lesions present bilateral submetatarsal 2 with the left side more annular, deeper and annular.  After debridement there is resolution of symptoms.  There is no surrounding redness or drainage or any clinical signs of infection present. No open lesions or pre-ulcerative lesions.  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  -Lesion was sharply debrided x2 without any complications or bleeding. Sites appear to be thicker until I did clean the area with alcohol and a pad was placed followed by salicylic acid and a bandage.  Post procedure instructions were discussed.  Months for infection. -Patient encouraged to call the office with any questions, concerns, change in symptoms.    Trula Slade DPM

## 2017-11-27 ENCOUNTER — Encounter: Payer: Self-pay | Admitting: Internal Medicine

## 2017-11-27 ENCOUNTER — Ambulatory Visit (INDEPENDENT_AMBULATORY_CARE_PROVIDER_SITE_OTHER): Payer: Medicare Other | Admitting: Internal Medicine

## 2017-11-27 VITALS — BP 118/82 | HR 80 | Temp 98.3°F | Wt 145.9 lb

## 2017-11-27 DIAGNOSIS — R52 Pain, unspecified: Secondary | ICD-10-CM | POA: Diagnosis not present

## 2017-11-27 DIAGNOSIS — R05 Cough: Secondary | ICD-10-CM

## 2017-11-27 DIAGNOSIS — R058 Other specified cough: Secondary | ICD-10-CM

## 2017-11-27 DIAGNOSIS — R6889 Other general symptoms and signs: Secondary | ICD-10-CM

## 2017-11-27 LAB — POC INFLUENZA A&B (BINAX/QUICKVUE)
INFLUENZA A, POC: NEGATIVE
Influenza B, POC: NEGATIVE

## 2017-11-27 NOTE — Patient Instructions (Addendum)
Your lung exam is reassuring today   This acts like  Influenza     Can use tylenol or advil for body aches  Chills etc.  mucinx dm ok  Or equivalent     if you get short of breath or fever that lasts more than 3-4 days get reevlautation .    Stay hydrated  Influenza, Adult Influenza, more commonly known as "the flu," is a viral infection that primarily affects the respiratory tract. The respiratory tract includes organs that help you breathe, such as the lungs, nose, and throat. The flu causes many common cold symptoms, as well as a high fever and body aches. The flu spreads easily from person to person (is contagious). Getting a flu shot (influenza vaccination) every year is the best way to prevent influenza. What are the causes? Influenza is caused by a virus. You can catch the virus by:  Breathing in droplets from an infected person's cough or sneeze.  Touching something that was recently contaminated with the virus and then touching your mouth, nose, or eyes.  What increases the risk? The following factors may make you more likely to get the flu:  Not cleaning your hands frequently with soap and water or alcohol-based hand sanitizer.  Having close contact with many people during cold and flu season.  Touching your mouth, eyes, or nose without washing or sanitizing your hands first.  Not drinking enough fluids or not eating a healthy diet.  Not getting enough sleep or exercise.  Being under a high amount of stress.  Not getting a yearly (annual) flu shot.  You may be at a higher risk of complications from the flu, such as a severe lung infection (pneumonia), if you:  Are over the age of 40.  Are pregnant.  Have a weakened disease-fighting system (immune system). You may have a weakened immune system if you: ? Have HIV or AIDS. ? Are undergoing chemotherapy. ? Aretaking medicines that reduce the activity of (suppress) the immune system.  Have a long-term (chronic)  illness, such as heart disease, kidney disease, diabetes, or lung disease.  Have a liver disorder.  Are obese.  Have anemia.  What are the signs or symptoms? Symptoms of this condition typically last 4-10 days and may include:  Fever.  Chills.  Headache, body aches, or muscle aches.  Sore throat.  Cough.  Runny or congested nose.  Chest discomfort and cough.  Poor appetite.  Weakness or tiredness (fatigue).  Dizziness.  Nausea or vomiting.  How is this diagnosed? This condition may be diagnosed based on your medical history and a physical exam. Your health care provider may do a nose or throat swab test to confirm the diagnosis. How is this treated? If influenza is detected early, you can be treated with antiviral medicine that can reduce the length of your illness and the severity of your symptoms. This medicine may be given by mouth (orally) or through an IV tube that is inserted in one of your veins. The goal of treatment is to relieve symptoms by taking care of yourself at home. This may include taking over-the-counter medicines, drinking plenty of fluids, and adding humidity to the air in your home. In some cases, influenza goes away on its own. Severe influenza or complications from influenza may be treated in a hospital. Follow these instructions at home:  Take over-the-counter and prescription medicines only as told by your health care provider.  Use a cool mist humidifier to add humidity to  the air in your home. This can make breathing easier.  Rest as needed.  Drink enough fluid to keep your urine clear or pale yellow.  Cover your mouth and nose when you cough or sneeze.  Wash your hands with soap and water often, especially after you cough or sneeze. If soap and water are not available, use hand sanitizer.  Stay home from work or school as told by your health care provider. Unless you are visiting your health care provider, try to avoid leaving home  until your fever has been gone for 24 hours without the use of medicine.  Keep all follow-up visits as told by your health care provider. This is important. How is this prevented?  Getting an annual flu shot is the best way to avoid getting the flu. You may get the flu shot in late summer, fall, or winter. Ask your health care provider when you should get your flu shot.  Wash your hands often or use hand sanitizer often.  Avoid contact with people who are sick during cold and flu season.  Eat a healthy diet, drink plenty of fluids, get enough sleep, and exercise regularly. Contact a health care provider if:  You develop new symptoms.  You have: ? Chest pain. ? Diarrhea. ? A fever.  Your cough gets worse.  You produce more mucus.  You feel nauseous or you vomit. Get help right away if:  You develop shortness of breath or difficulty breathing.  Your skin or nails turn a bluish color.  You have severe pain or stiffness in your neck.  You develop a sudden headache or sudden pain in your face or ear.  You cannot stop vomiting. This information is not intended to replace advice given to you by your health care provider. Make sure you discuss any questions you have with your health care provider. Document Released: 09/05/2000 Document Revised: 02/14/2016 Document Reviewed: 07/03/2015 Elsevier Interactive Patient Education  2017 [employer].

## 2017-11-27 NOTE — Progress Notes (Signed)
Chief Complaint  Patient presents with  . Flu-Like Illness    Body aches and heaviness in body. Pt having chest discomfort, chills, sinus pressure, headache, little cough. Denies SOB and fever. Symptoms x 1 day.     HPI: Cindy Schwartz 84 y.o. come in for   Less than 24 hours of  shiverring and pain and  Pressure in head when lays down .  And cold .   Had flu vaccine  And exposed at times . Cough not that much but does  Hurt  To cough .   Husband   Has lung cancer  And  Has cough alos before her .  Had lfu vaccine  .  Has had a cough issues off and o for a while  doesn think got beter since  Dec jan . But no sob  Hemoptysis  .  Last x ray  1 19  ROS: See pertinent positives and negatives per HPI.  Past Medical History:  Diagnosis Date  . Aneurysm of ascending aorta ([place])    a. 4.2-4.3cm in 2014.  Marland Kitchen Anxiety   . Breast cancer ([place]) 2018   right mastectomy  . Cancer ([place]) 1980   ovarian cancer  . Elevated BP Transient 09/2014  . Genetic testing 07/09/2017   Multi-Cancer panel (83 genes) @ [facility] - No pathogenic mutations detected  . GERD (gastroesophageal reflux disease)   . H/O bone density study    2015  . H/O colonoscopy   . History of ovarian cancer    November 1979  . 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 Insomnia   . Normal coronary arteries Cath - 09/2014  . Osteoporosis   . Raynaud's disease     Family History  Problem Relation Age of Onset  . Healthy Mother   . Healthy Brother   . Breast cancer Cousin 69       mat first cousin related through uncle  . Esophageal cancer Cousin 14       mat first cousin related through uncle    Social History   Socioeconomic History  . Marital status: Married    Spouse name: Publishing copy  . Number of children: 2  . Years of education: None  . Highest education level: None  Social Needs  . Financial resource strain: None  . Food insecurity - worry: None  . Food  insecurity - inability: None  . Transportation needs - medical: None  . Transportation needs - non-medical: None  Occupational History  . Occupation: retired    Fish farm manager: RETIRED  Tobacco Use  . Smoking status: Never Smoker  . Smokeless tobacco: Never Used  Substance and Sexual Activity  . Alcohol use: No  . Drug use: No  . Sexual activity: None  Other Topics Concern  . None  Social History Narrative   She and her husband are from the [place].`   Children: [name] Rehman age 28, lives in [place], [place]ona [place], age 88, lives in [place], [place]    Outpatient Medications Prior to Visit  Medication Sig Dispense Refill  . CALCIUM-MAG-VIT C-VIT D PO Take 15 mLs by mouth daily.    Marland Kitchen NEXIUM 40 MG capsule TAKE 1 CAPSULE DAILY (Patient taking differently: TAKE 1 CAPSULE EVERY OTHER DAY) 90 capsule 3  . Omega-3 Fatty Acids (FISH OIL PO) Take 5 mLs by mouth daily.    . valACYclovir (VALTREX) 500 MG tablet  Take 500 mg by mouth 2 (two) times a week.    Marland Kitchen albuterol (PROVENTIL HFA;VENTOLIN HFA) 108 (90 Base) MCG/ACT inhaler Inhale 2 puffs into the lungs every 6 (six) hours as needed. (Patient not taking: Reported on 10/06/2017) 1 Inhaler 0  . benzonatate (TESSALON) 100 MG capsule Take 1 capsule (100 mg total) by mouth 2 (two) times daily as needed for cough. (Patient not taking: Reported on 10/06/2017) 20 capsule 0  . fluticasone (FLONASE) 50 MCG/ACT nasal spray Place 2 sprays into both nostrils daily. (Patient not taking: Reported on 11/27/2017) 16 g 6   No facility-administered medications prior to visit.      EXAM:  BP 118/82 (BP Location: Left Arm, Patient Position: Sitting, Cuff Size: Normal)   Pulse 80   Temp 98.3 F (36.8 C) (Oral)   Wt 145 lb 14.4 oz (66.2 kg)   SpO2 97%   BMI 22.85 kg/m   Body mass index is 22.85 kg/m. WDWN in NAD  quiet respirations; min congested  somewhat hoarse. Non toxic . But wrapped up  Cold  Layers  HEENT: Normocephalic  ;atraumatic , Eyes;  PERRL, EOMs  Full, lids and conjunctiva clear,,Ears: no deformities, canals nl, TM landmarks normal, Nose: no deformity or discharge but congested;face minimally tender Mouth : OP clear without lesion or edema . Neck: Supple without adenopathy or masses or bruits Chest:  Clear to A&P without wheezes rales or rhonchi CV:  S1-S2 no gallops or murmurs peripheral perfusion is normal Skin :nl perfusion and no acute rashes   PSYCH: pleasant and cooperative, no obvious depression or anxiety  BP Readings from Last 3 Encounters:  11/27/17 118/82  10/06/17 120/70  10/02/17 104/76  poct flu  Negative   ASSESSMENT AND PLAN:  Discussed the following assessment and plan:  Flu-like symptoms - Plan: POC Influenza A&B (Binax test)  Body aches - Plan: POC Influenza A&B (Binax test)  Recurrent cough Flu screen negative but  prob influenza    Risk benefit of medication discussed. Of tamiflu  Not given . Sx rx   Expectant management. For alarm symptoms  -Patient advised to return or notify health care team  if  new concerns arise.  Patient Instructions  Your lung exam is reassuring today   This acts like  Influenza     Can use tylenol or advil for body aches  Chills etc.  mucinx dm ok  Or equivalent     if you get short of breath or fever that lasts more than 3-4 days get reevlautation .    Stay hydrated  Influenza, Adult Influenza, more commonly known as "the flu," is a viral infection that primarily affects the respiratory tract. The respiratory tract includes organs that help you breathe, such as the lungs, nose, and throat. The flu causes many common cold symptoms, as well as a high fever and body aches. The flu spreads easily from person to person (is contagious). Getting a flu shot (influenza vaccination) every year is the best way to prevent influenza. What are the causes? Influenza is caused by a virus. You can catch the virus by:  Breathing in droplets from an  infected person's cough or sneeze.  Touching something that was recently contaminated with the virus and then touching your mouth, nose, or eyes.  What increases the risk? The following factors may make you more likely to get the flu:  Not cleaning your hands frequently with soap and water or alcohol-based hand sanitizer.  Having close contact with  many people during cold and flu season.  Touching your mouth, eyes, or nose without washing or sanitizing your hands first.  Not drinking enough fluids or not eating a healthy diet.  Not getting enough sleep or exercise.  Being under a high amount of stress.  Not getting a yearly (annual) flu shot.  You may be at a higher risk of complications from the flu, such as a severe lung infection (pneumonia), if you:  Are over the age of 72.  Are pregnant.  Have a weakened disease-fighting system (immune system). You may have a weakened immune system if you: ? Have HIV or AIDS. ? Are undergoing chemotherapy. ? Aretaking medicines that reduce the activity of (suppress) the immune system.  Have a long-term (chronic) illness, such as heart disease, kidney disease, diabetes, or lung disease.  Have a liver disorder.  Are obese.  Have anemia.  What are the signs or symptoms? Symptoms of this condition typically last 4-10 days and may include:  Fever.  Chills.  Headache, body aches, or muscle aches.  Sore throat.  Cough.  Runny or congested nose.  Chest discomfort and cough.  Poor appetite.  Weakness or tiredness (fatigue).  Dizziness.  Nausea or vomiting.  How is this diagnosed? This condition may be diagnosed based on your medical history and a physical exam. Your health care provider may do a nose or throat swab test to confirm the diagnosis. How is this treated? If influenza is detected early, you can be treated with antiviral medicine that can reduce the length of your illness and the severity of your symptoms.  This medicine may be given by mouth (orally) or through an IV tube that is inserted in one of your veins. The goal of treatment is to relieve symptoms by taking care of yourself at home. This may include taking over-the-counter medicines, drinking plenty of fluids, and adding humidity to the air in your home. In some cases, influenza goes away on its own. Severe influenza or complications from influenza may be treated in a hospital. Follow these instructions at home:  Take over-the-counter and prescription medicines only as told by your health care provider.  Use a cool mist humidifier to add humidity to the air in your home. This can make breathing easier.  Rest as needed.  Drink enough fluid to keep your urine clear or pale yellow.  Cover your mouth and nose when you cough or sneeze.  Wash your hands with soap and water often, especially after you cough or sneeze. If soap and water are not available, use hand sanitizer.  Stay home from work or school as told by your health care provider. Unless you are visiting your health care provider, try to avoid leaving home until your fever has been gone for 24 hours without the use of medicine.  Keep all follow-up visits as told by your health care provider. This is important. How is this prevented?  Getting an annual flu shot is the best way to avoid getting the flu. You may get the flu shot in late summer, fall, or winter. Ask your health care provider when you should get your flu shot.  Wash your hands often or use hand sanitizer often.  Avoid contact with people who are sick during cold and flu season.  Eat a healthy diet, drink plenty of fluids, get enough sleep, and exercise regularly. Contact a health care provider if:  You develop new symptoms.  You have: ? Chest pain. ? Diarrhea. ?  A fever.  Your cough gets worse.  You produce more mucus.  You feel nauseous or you vomit. Get help right away if:  You develop shortness of  breath or difficulty breathing.  Your skin or nails turn a bluish color.  You have severe pain or stiffness in your neck.  You develop a sudden headache or sudden pain in your face or ear.  You cannot stop vomiting. This information is not intended to replace advice given to you by your health care provider. Make sure you discuss any questions you have with your health care provider. Document Released: 09/05/2000 Document Revised: 02/14/2016 Document Reviewed: 07/03/2015 Elsevier Interactive Patient Education  2017 [place] K. Panosh M.D.

## 2017-11-30 ENCOUNTER — Telehealth: Payer: Self-pay | Admitting: Internal Medicine

## 2017-11-30 NOTE — Telephone Encounter (Signed)
Copied from [place] [id number]. Topic: Quick Communication - See Telephone Encounter >> Nov 30, 2017 10:41 AM Ether Griffins B wrote: CRM for notification. See Telephone encounter for:  Pt was seen by Dr. Regis Bill on 11/27/17. She is requesting a cough medicine be called in. Please send to [facility] [id number] - [place], [place] [address] 11/30/17.

## 2017-12-02 NOTE — Telephone Encounter (Signed)
Generic Tessalon No. 20  1 twice daily

## 2017-12-04 NOTE — Telephone Encounter (Signed)
Tessalon 100 mg #20 1 tablet twice daily as needed

## 2017-12-05 ENCOUNTER — Ambulatory Visit (INDEPENDENT_AMBULATORY_CARE_PROVIDER_SITE_OTHER): Payer: Medicare Other | Admitting: Family

## 2017-12-05 ENCOUNTER — Encounter: Payer: Self-pay | Admitting: Family

## 2017-12-05 VITALS — BP 110/72 | HR 80 | Temp 97.8°F | Resp 16 | Ht 67.0 in | Wt 143.0 lb

## 2017-12-05 DIAGNOSIS — J40 Bronchitis, not specified as acute or chronic: Secondary | ICD-10-CM

## 2017-12-05 MED ORDER — AZITHROMYCIN 250 MG PO TABS: ORAL_TABLET | ORAL | 0 refills | Status: DC

## 2017-12-05 MED ORDER — BENZONATATE 100 MG PO CAPS: 100.0000 mg | ORAL_CAPSULE | Freq: Two times a day (BID) | ORAL | 0 refills | Status: DC | PRN

## 2017-12-05 NOTE — Progress Notes (Signed)
Pre visit review using our clinic review tool, if applicable. No additional management support is needed unless otherwise documented below in the visit note. 

## 2017-12-05 NOTE — Patient Instructions (Signed)
[place]  Meeting you  Plenty of water  Tessalon perles  Azithromycin  Probiotics  Let us know if not better   Acute Bronchitis, Adult Acute bronchitis is sudden (acute) swelling of the air tubes (bronchi) in the lungs. Acute bronchitis causes these tubes to fill with mucus, which can make it hard to breathe. It can also cause coughing or wheezing. In adults, acute bronchitis usually goes away within 2 weeks. A cough caused by bronchitis may last up to 3 weeks. Smoking, allergies, and asthma can make the condition worse. Repeated episodes of bronchitis may cause further lung problems, such as chronic obstructive pulmonary disease (COPD). What are the causes? This condition can be caused by germs and by substances that irritate the lungs, including:  Cold and flu viruses. This condition is most often caused by the same virus that causes a cold.  Bacteria.  Exposure to tobacco smoke, dust, fumes, and air pollution.  What increases the risk? This condition is more likely to develop in people who:  Have close contact with someone with acute bronchitis.  Are exposed to lung irritants, such as tobacco smoke, dust, fumes, and vapors.  Have a weak immune system.  Have a respiratory condition such as asthma.  What are the signs or symptoms? Symptoms of this condition include:  A cough.  Coughing up clear, yellow, or green mucus.  Wheezing.  Chest congestion.  Shortness of breath.  A fever.  Body aches.  Chills.  A sore throat.  How is this diagnosed? This condition is usually diagnosed with a physical exam. During the exam, your health care provider may order tests, such as chest X-rays, to rule out other conditions. He or she may also:  Test a sample of your mucus for bacterial infection.  Check the level of oxygen in your blood. This is done to check for pneumonia.  Do a chest X-ray or lung function testing to rule out pneumonia and other  conditions.  Perform blood tests.  Your health care provider will also ask about your symptoms and medical history. How is this treated? Most cases of acute bronchitis clear up over time without treatment. Your health care provider may recommend:  Drinking more fluids. Drinking more makes your mucus thinner, which may make it easier to breathe.  Taking a medicine for a fever or cough.  Taking an antibiotic medicine.  Using an inhaler to help improve shortness of breath and to control a cough.  Using a cool mist vaporizer or humidifier to make it easier to breathe.  Follow these instructions at home: Medicines  Take over-the-counter and prescription medicines only as told by your health care provider.  If you were prescribed an antibiotic, take it as told by your health care provider. Do not stop taking the antibiotic even if you start to feel better. General instructions  Get plenty of rest.  Drink enough fluids to keep your urine clear or pale yellow.  Avoid smoking and secondhand smoke. Exposure to cigarette smoke or irritating chemicals will make bronchitis worse. If you smoke and you need help quitting, ask your health care provider. Quitting smoking will help your lungs heal faster.  Use an inhaler, cool mist vaporizer, or humidifier as told by your health care provider.  Keep all follow-up visits as told by your health care provider. This is important. How is this prevented? To lower your risk of getting this condition again:  Wash your hands often with soap and water. If soap and water  are not available, use hand sanitizer.  Avoid contact with people who have cold symptoms.  Try not to touch your hands to your mouth, nose, or eyes.  Make sure to get the flu shot every year.  Contact a health care provider if:  Your symptoms do not improve in 2 weeks of treatment. Get help right away if:  You cough up blood.  You have chest pain.  You have severe shortness  of breath.  You become dehydrated.  You faint or keep feeling like you are going to faint.  You keep vomiting.  You have a severe headache.  Your fever or chills gets worse. This information is not intended to replace advice given to you by your health care provider. Make sure you discuss any questions you have with your health care provider. Document Released: 10/16/2004 Document Revised: 04/02/2016 Document Reviewed: 02/27/2016 Elsevier Interactive Patient Education  [employer].

## 2017-12-05 NOTE — Progress Notes (Signed)
Subjective:    Patient ID: Cindy Schwartz, female    DOB: September 12, 1934, 84 y.o.   MRN: [id number]  CC: Jamoni Broadfoot is a 84 y.o. female who presents today for an acute visit.    HPI: CC: cough, congestion x 2 weeks, unchanged.   Seen on 11/27/17. Suspect flu. Has been on mucinex DM, however stopped over the past week. Has been taking delsym for cough, tyelonol with some relief. Myalagias resolved since last seen.   Cough worse when laying down. No wheezing, sinus pressure, ear pain, CP, SOB, wheezing.   She notes she thinks she is getting sick around Christmas time.  4 months ago however it is never gone away.  She was seen in December, January for cough and given doxycycline, albuterol, tessalon. Never went completely away.   Non smoker, no lung disease      CXR 09/23/17  - hyperinflation  ; discussed and reassured NO COPD with PCP.  HISTORY:  Past Medical History:  Diagnosis Date  . Aneurysm of ascending aorta ([place])    a. 4.2-4.3cm in 2014.  Marland Kitchen Anxiety   . Breast cancer ([place]) 2018   right mastectomy  . Cancer ([place]) 1980   ovarian cancer  . Elevated BP Transient 09/2014  . Genetic testing 07/09/2017   Multi-Cancer panel (83 genes) @ [facility] - No pathogenic mutations detected  . GERD (gastroesophageal reflux disease)   . H/O bone density study    2015  . H/O colonoscopy   . History of ovarian cancer    November 1979  . 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 Insomnia   . Normal coronary arteries Cath - 09/2014  . Osteoporosis   . Raynaud's disease    Past Surgical History:  Procedure Laterality Date  . ABDOMINAL HYSTERECTOMY    . APPENDECTOMY    . BILATERAL SALPINGOOPHORECTOMY    . BREAST LUMPECTOMY    . CARDIAC CATHETERIZATION    . LEFT HEART CATHETERIZATION WITH CORONARY ANGIOGRAM N/A 10/17/2014   Procedure: LEFT HEART CATHETERIZATION WITH CORONARY ANGIOGRAM;  Surgeon: [name], MD;  Location: [facility] CATH LAB;   Service: Cardiovascular;  Laterality: N/A;  . MASTECTOMY W/ SENTINEL NODE BIOPSY Right 05/05/2017   total mastectomy  . ovarian ca s/p taj/bso  1979  . SIMPLE MASTECTOMY WITH AXILLARY SENTINEL NODE BIOPSY Right 05/05/2017   Procedure: RIGHT TOTAL MASTECTOMY WITH RIGHT  AXILLARY SENTINEL NODE BIOPSY;  Surgeon: [name], MD;  Location: [place];  Service: General;  Laterality: Right;  PECTORAL BLOCK   Family History  Problem Relation Age of Onset  . Healthy Mother   . Healthy Brother   . Breast cancer Cousin 48       mat first cousin related through uncle  . Esophageal cancer Cousin 60       mat first cousin related through uncle    Allergies: Chlorhexidine gluconate Current Outpatient Medications on File Prior to Visit  Medication Sig Dispense Refill  . CALCIUM-MAG-VIT C-VIT D PO Take 15 mLs by mouth daily.    Marland Kitchen NEXIUM 40 MG capsule TAKE 1 CAPSULE DAILY (Patient taking differently: TAKE 1 CAPSULE EVERY OTHER DAY) 90 capsule 3  . Omega-3 Fatty Acids (FISH OIL PO) Take 5 mLs by mouth daily.    . valACYclovir (VALTREX) 500 MG tablet Take 500 mg by mouth 2 (two) times a week.     No current facility-administered medications on file prior to visit.  Social History   Tobacco Use  . Smoking status: Never Smoker  . Smokeless tobacco: Never Used  Substance Use Topics  . Alcohol use: No  . Drug use: No    Review of Systems  Constitutional: Negative for chills and fever.  HENT: Positive for congestion. Negative for sinus pain and sore throat.   Respiratory: Positive for cough. Negative for shortness of breath and wheezing.   Cardiovascular: Negative for chest pain and palpitations.  Gastrointestinal: Negative for nausea and vomiting.      Objective:    BP 110/72 (BP Location: Left Arm, Patient Position: Sitting, Cuff Size: Normal)   Pulse 80   Temp 97.8 F (36.6 C) (Oral)   Resp 16   Ht 5\' 7"  (1.702 m)   Wt 143 lb (64.9 kg)   SpO2 98%   BMI 22.40 kg/m     Physical Exam  Constitutional: She appears well-developed and well-nourished.  HENT:  Head: Normocephalic and atraumatic.  Right Ear: Hearing, tympanic membrane, external ear and ear canal normal. No drainage, swelling or tenderness. No foreign bodies. Tympanic membrane is not erythematous and not bulging. No middle ear effusion. No decreased hearing is noted.  Left Ear: Hearing, tympanic membrane, external ear and ear canal normal. No drainage, swelling or tenderness. No foreign bodies. Tympanic membrane is not erythematous and not bulging.  No middle ear effusion. No decreased hearing is noted.  Nose: Nose normal. No rhinorrhea. Right sinus exhibits no maxillary sinus tenderness and no frontal sinus tenderness. Left sinus exhibits no maxillary sinus tenderness and no frontal sinus tenderness.  Mouth/Throat: Uvula is midline, oropharynx is clear and moist and mucous membranes are normal. No oropharyngeal exudate, posterior oropharyngeal edema, posterior oropharyngeal erythema or tonsillar abscesses.  Eyes: Conjunctivae are normal.  Cardiovascular: Regular rhythm, normal heart sounds and normal pulses.  Pulmonary/Chest: Effort normal. She has wheezes. She has no rhonchi. She has no rales.  One expiratory wheeze appreciated. Resolved with deep breathing.   Lymphadenopathy:       Head (right side): No submental, no submandibular, no tonsillar, no preauricular, no posterior auricular and no occipital adenopathy present.       Head (left side): No submental, no submandibular, no tonsillar, no preauricular, no posterior auricular and no occipital adenopathy present.    She has no cervical adenopathy.  Neurological: She is alert.  Skin: Skin is warm and dry.  Psychiatric: She has a normal mood and affect. Her speech is normal and behavior is normal. Thought content normal.  Vitals reviewed.      Assessment & Plan:   1. Bronchitis Afebrile.  Patient is well-appearing.  No acute respiratory  distress.  She was treated recently with doxycycline with temporary resolve.  She was also seen again 2 weeks ago for suspected influenza and treated with conservative therapy.  We jointly decided antibiotic therapy was appropriate at this time.  start azithromycin.  Tessalon Perles as needed.  She politely declines albuterol inhaler.  Return precautions given and advised if continues, she needs further evaluation by PCP for chronic cough.   - benzonatate (TESSALON) 100 MG capsule; Take 1 capsule (100 mg total) by mouth 2 (two) times daily as needed for cough.  Dispense: 20 capsule; Refill: 0 - azithromycin (ZITHROMAX) 250 MG tablet; Tale 500 mg PO on day 1, then 250 mg PO q24h x 4 days.  Dispense: 6 tablet; Refill: 0    I am having Cindy Schwartz start on benzonatate and azithromycin. I am also having her  maintain her NEXIUM, Omega-3 Fatty Acids (FISH OIL PO), CALCIUM-MAG-VIT C-VIT D PO, and valACYclovir.   Meds ordered this encounter  Medications  . benzonatate (TESSALON) 100 MG capsule    Sig: Take 1 capsule (100 mg total) by mouth 2 (two) times daily as needed for cough.    Dispense:  20 capsule    Refill:  0    Order Specific Question:   Supervising Provider    Answer:   Deborra Medina L [[id number]]  . azithromycin (ZITHROMAX) 250 MG tablet    Sig: Tale 500 mg PO on day 1, then 250 mg PO q24h x 4 days.    Dispense:  6 tablet    Refill:  0    Order Specific Question:   Supervising Provider    Answer:   Crecencio Mc [[id number]]    Return precautions given.   Risks, benefits, and alternatives of the medications and treatment plan prescribed today were discussed, and patient expressed understanding.   Education regarding symptom management and diagnosis given to patient on AVS.  Continue to follow with Marletta Lor, MD for routine health maintenance.   Javier Docker and I agreed with plan.   Mable Paris, FNP

## 2017-12-07 NOTE — Telephone Encounter (Signed)
Called patient and left message to return call

## 2017-12-08 NOTE — Telephone Encounter (Signed)
Spoke to patient and she went to a Saturday clinic and was prescribe antibiotics. No further action needed.

## 2017-12-11 NOTE — Telephone Encounter (Addendum)
Patient was in to see Mable Paris at the Saturday clinic on 12/05/17 and she was told if she didn't get any better to call in and she would prescribe something else for her like cough medication.

## 2017-12-11 NOTE — Telephone Encounter (Signed)
Please advise 

## 2017-12-14 ENCOUNTER — Ambulatory Visit (INDEPENDENT_AMBULATORY_CARE_PROVIDER_SITE_OTHER): Payer: Medicare Other | Admitting: Internal Medicine

## 2017-12-14 ENCOUNTER — Encounter: Payer: Self-pay | Admitting: Internal Medicine

## 2017-12-14 VITALS — BP 138/80 | HR 66 | Temp 98.1°F | Wt 143.0 lb

## 2017-12-14 DIAGNOSIS — K219 Gastro-esophageal reflux disease without esophagitis: Secondary | ICD-10-CM

## 2017-12-14 DIAGNOSIS — J40 Bronchitis, not specified as acute or chronic: Secondary | ICD-10-CM | POA: Diagnosis not present

## 2017-12-14 DIAGNOSIS — J302 Other seasonal allergic rhinitis: Secondary | ICD-10-CM | POA: Diagnosis not present

## 2017-12-14 DIAGNOSIS — M5030 Other cervical disc degeneration, unspecified cervical region: Secondary | ICD-10-CM | POA: Diagnosis not present

## 2017-12-14 LAB — COMPREHENSIVE METABOLIC PANEL
ALT: 12 U/L (ref 0–35)
AST: 17 U/L (ref 0–37)
Albumin: 3.8 g/dL (ref 3.5–5.2)
Alkaline Phosphatase: 65 U/L (ref 39–117)
BILIRUBIN TOTAL: 0.8 mg/dL (ref 0.2–1.2)
BUN: 11 mg/dL (ref 6–23)
CALCIUM: 9.6 mg/dL (ref 8.4–10.5)
CO2: 31 mEq/L (ref 19–32)
Chloride: 98 mEq/L (ref 96–112)
Creatinine, Ser: 0.51 mg/dL (ref 0.40–1.20)
GFR: 122.71 mL/min (ref >60.00)
GLUCOSE: 95 mg/dL (ref 70–99)
POTASSIUM: 4.5 meq/L (ref 3.5–5.1)
Sodium: 134 mEq/L — ABNORMAL LOW (ref 135–145)
Total Protein: 7.2 g/dL (ref 6.0–8.3)

## 2017-12-14 LAB — CBC WITH DIFFERENTIAL/PLATELET
BASOS ABS: 0 10*3/uL (ref 0.0–0.1)
Basophils Relative: 0.8 % (ref 0.0–3.0)
EOS PCT: 1.8 % (ref 0.0–5.0)
Eosinophils Absolute: 0.1 10*3/uL (ref 0.0–0.7)
HEMATOCRIT: 38.3 % (ref 36.0–46.0)
HEMOGLOBIN: 12.6 g/dL (ref 12.0–15.0)
LYMPHS ABS: 1.8 10*3/uL (ref 0.7–4.0)
LYMPHS PCT: 29.9 % (ref 12.0–46.0)
MCHC: 33 g/dL (ref 30.0–36.0)
MCV: 83.2 fl (ref 78.0–100.0)
MONOS PCT: 11.5 % (ref 3.0–12.0)
Monocytes Absolute: 0.7 10*3/uL (ref 0.1–1.0)
NEUTROS PCT: 56 % (ref 43.0–77.0)
Neutro Abs: 3.4 10*3/uL (ref 1.4–7.7)
Platelets: 308 10*3/uL (ref 150.0–400.0)
RBC: 4.61 Mil/uL (ref 3.87–5.11)
RDW: 15 % (ref 11.5–15.5)
WBC: 6.1 10*3/uL (ref 4.0–10.5)

## 2017-12-14 MED ORDER — VALACYCLOVIR HCL 500 MG PO TABS: 500.0000 mg | ORAL_TABLET | Freq: Every day | ORAL | 3 refills | Status: DC

## 2017-12-14 NOTE — Progress Notes (Signed)
Subjective:    Patient ID: Cindy Schwartz, female    DOB: 05-31-1934, 84 y.o.   MRN: [id number]  HPI 84 year old patient who is seen today in follow-up.  She complains of chronic cough since late December.  She has been seen by a number of providers and has been treated with both doxycycline and azithromycin.  She continues to have cough that is worse at night.  She has also been seen by ENT and treated for rhinorrhea denies any fever or productive cough.  She had a negative chest x-ray in January.  She is requesting laboratory testing  She also has a history of recurrent herpes simplex involving the right buttock area and is requesting a refill of Valtrex.  Past Medical History:  Diagnosis Date  . Aneurysm of ascending aorta ([place])    a. 4.2-4.3cm in 2014.  Marland Kitchen Anxiety   . Breast cancer ([place]) 2018   right mastectomy  . Cancer ([place]) 1980   ovarian cancer  . Elevated BP Transient 09/2014  . Genetic testing 07/09/2017   Multi-Cancer panel (83 genes) @ [facility] - No pathogenic mutations detected  . GERD (gastroesophageal reflux disease)   . H/O bone density study    2015  . H/O colonoscopy   . History of ovarian cancer    November 1979  . 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 Insomnia   . Normal coronary arteries Cath - 09/2014  . Osteoporosis   . Raynaud's disease      Social History   Socioeconomic History  . Marital status: Married    Spouse name: Publishing copy  . Number of children: 2  . Years of education: Not on file  . Highest education level: Not on file  Occupational History  . Occupation: retired    Fish farm manager: RETIRED  Social Needs  . Financial resource strain: Not on file  . Food insecurity:    Worry: Not on file    Inability: Not on file  . Transportation needs:    Medical: Not on file    Non-medical: Not on file  Tobacco Use  . Smoking status: Never Smoker  . Smokeless tobacco: Never Used  Substance and  Sexual Activity  . Alcohol use: No  . Drug use: No  . Sexual activity: Not on file  Lifestyle  . Physical activity:    Days per week: Not on file    Minutes per session: Not on file  . Stress: Not on file  Relationships  . Social connections:    Talks on phone: Not on file    Gets together: Not on file    Attends religious service: Not on file    Active member of club or organization: Not on file    Attends meetings of clubs or organizations: Not on file    Relationship status: Not on file  . Intimate partner violence:    Fear of current or ex partner: Not on file    Emotionally abused: Not on file    Physically abused: Not on file    Forced sexual activity: Not on file  Other Topics Concern  . Not on file  Social History Narrative   She and her husband are from the [place] of [place].`   Children: [name] Nugent age 67, lives in [place], Gregory, age 49, lives in [place], [place]    Past Surgical History:  Procedure Laterality Date  . ABDOMINAL HYSTERECTOMY    .  APPENDECTOMY    . BILATERAL SALPINGOOPHORECTOMY    . BREAST LUMPECTOMY    . CARDIAC CATHETERIZATION    . LEFT HEART CATHETERIZATION WITH CORONARY ANGIOGRAM N/A 10/17/2014   Procedure: LEFT HEART CATHETERIZATION WITH CORONARY ANGIOGRAM;  Surgeon: [name], MD;  Location: [facility] CATH LAB;  Service: Cardiovascular;  Laterality: N/A;  . MASTECTOMY W/ SENTINEL NODE BIOPSY Right 05/05/2017   total mastectomy  . ovarian ca s/p taj/bso  1979  . SIMPLE MASTECTOMY WITH AXILLARY SENTINEL NODE BIOPSY Right 05/05/2017   Procedure: RIGHT TOTAL MASTECTOMY WITH RIGHT  AXILLARY SENTINEL NODE BIOPSY;  Surgeon: [name], MD;  Location: [place];  Service: General;  Laterality: Right;  PECTORAL BLOCK    Family History  Problem Relation Age of Onset  . Healthy Mother   . Healthy Brother   . Breast cancer Cousin 62       mat first cousin related through uncle  . Esophageal cancer Cousin 31       mat  first cousin related through uncle    Allergies  Allergen Reactions  . Chlorhexidine Gluconate Rash    Current Outpatient Medications on File Prior to Visit  Medication Sig Dispense Refill  . azithromycin (ZITHROMAX) 250 MG tablet Tale 500 mg PO on day 1, then 250 mg PO q24h x 4 days. 6 tablet 0  . benzonatate (TESSALON) 100 MG capsule Take 1 capsule (100 mg total) by mouth 2 (two) times daily as needed for cough. 20 capsule 0  . CALCIUM-MAG-VIT C-VIT D PO Take 15 mLs by mouth daily.    Marland Kitchen ipratropium (ATROVENT) 0.06 % nasal spray Place 2 sprays into both nostrils 4 (four) times daily.    Marland Kitchen NEXIUM 40 MG capsule TAKE 1 CAPSULE DAILY (Patient taking differently: TAKE 1 CAPSULE EVERY OTHER DAY) 90 capsule 3  . Omega-3 Fatty Acids (FISH OIL PO) Take 5 mLs by mouth daily.    . valACYclovir (VALTREX) 500 MG tablet Take 500 mg by mouth 2 (two) times a week.     No current facility-administered medications on file prior to visit.     BP 138/80 (BP Location: Right Arm, Patient Position: Sitting, Cuff Size: Normal)   Pulse 66   Temp 98.1 F (36.7 C) (Oral)   Wt 143 lb (64.9 kg)   SpO2 99%   BMI 22.40 kg/m      Review of Systems  Constitutional: Positive for fatigue.  HENT: Positive for congestion, postnasal drip and rhinorrhea. Negative for dental problem, hearing loss, sinus pressure, sore throat and tinnitus.   Eyes: Negative for pain, discharge and visual disturbance.  Respiratory: Positive for cough. Negative for shortness of breath.   Cardiovascular: Negative for chest pain, palpitations and leg swelling.  Gastrointestinal: Negative for abdominal distention, abdominal pain, blood in stool, constipation, diarrhea, nausea and vomiting.  Genitourinary: Negative for difficulty urinating, dysuria, flank pain, frequency, hematuria, pelvic pain, urgency, vaginal bleeding, vaginal discharge and vaginal pain.  Musculoskeletal: Negative for arthralgias, gait problem and joint swelling.    Skin: Negative for rash.  Neurological: Negative for dizziness, syncope, speech difficulty, weakness, numbness and headaches.  Hematological: Negative for adenopathy.  Psychiatric/Behavioral: Negative for agitation, behavioral problems and dysphoric mood. The patient is not nervous/anxious.        Objective:   Physical Exam  Constitutional: She is oriented to person, place, and time. She appears well-developed and well-nourished.  HENT:  Head: Normocephalic.  Right Ear: External ear normal.  Left Ear: External ear normal.  Mouth/Throat:  Oropharynx is clear and moist.  Eyes: Pupils are equal, round, and reactive to light. Conjunctivae and EOM are normal.  Neck: Normal range of motion. Neck supple. No thyromegaly present.  Cardiovascular: Normal rate, regular rhythm, normal heart sounds and intact distal pulses.  Pulmonary/Chest: Effort normal and breath sounds normal.  Abdominal: Soft. Bowel sounds are normal. She exhibits no mass. There is no tenderness.  Musculoskeletal: Normal range of motion.  Lymphadenopathy:    She has no cervical adenopathy.  Neurological: She is alert and oriented to person, place, and time.  Skin: Skin is warm and dry. No rash noted.  Psychiatric: She has a normal mood and affect. Her behavior is normal.          Assessment & Plan:   Chronic cough.  Probable upper airway cough syndrome.  Will add a nonsedating antihistamine to present regimen Check labs  Gastroesophageal reflux disease continue Nexium  History of recurrent herpes simplex.  Valtrex refilled  History of breast cancer.  Follow-up oncology  Nyoka Cowden

## 2017-12-14 NOTE — Telephone Encounter (Signed)
Patient saw PCP today.

## 2017-12-14 NOTE — Patient Instructions (Addendum)
Acute bronchitis symptoms are generally not helped by antibiotics.  Take over-the-counter expectorants and cough medications such as  Mucinex DM.  Call if there is no improvement in 5 to 7 days or if  you develop worsening cough, fever, or new symptoms, such as shortness of breath or chest pain.  Use a nonsedating antihistamine such as Allegra or Claritin once daily  Hydrate and Humidify  Drink enough water to keep your urine clear or pale yellow. Staying hydrated will help to thin your mucus.  Use a cool mist humidifier to keep the humidity level in your home above 50%.  Inhale steam for 10-15 minutes, 3-4 times a day or as told by your health care provider. You can do this in the bathroom while a hot shower is running.  Limit your exposure to cool or dry air. Rest  Rest as much as possible.  Sleep with your head raised (elevated).

## 2017-12-14 NOTE — Telephone Encounter (Signed)
Please call pt  If no better, please advise repeat eval. She has been sick quite a bit since December, multiple antibiotics, visits . She had normal CXR in January.  Echo was a couple of years, appeared normal.  No trial of flonase or pepcid ( want to eliminate post nasal drip or untreated reflux as contributors)  At this gesture, I would advise she follow up with pcp or I am happy to place a referral to pulmonology for eval. chronic cough.  I have cc'ed pcp

## 2017-12-14 NOTE — Telephone Encounter (Signed)
Since was Saturday clinic should patient follow up with PCP?

## 2017-12-24 ENCOUNTER — Other Ambulatory Visit: Payer: Self-pay | Admitting: General Surgery

## 2017-12-24 DIAGNOSIS — N644 Mastodynia: Secondary | ICD-10-CM

## 2017-12-24 DIAGNOSIS — Z853 Personal history of malignant neoplasm of breast: Secondary | ICD-10-CM

## 2017-12-29 NOTE — Progress Notes (Signed)
01/06/2018 Cindy Schwartz   15-Jul-1934  [id number]  Primary Physician Marletta Lor, MD Primary Cardiologist: Dr. Johnsie Cancel    Reason for Visit/CC: Dizziness, lightheadedness  HPI:  F/U palpitations, dizziness and aortic root dilatation   84 y.o. with history of HTN, fusiform aortic root dilatation 4.1 cm.  Chest pain January 2016 with normal cardiac catheterization. Palpitations with monitor only showing PACs;/PVC;s  Breast cancer with mastectomy August 2018. Husband diagnosed with lung cancer at same time. Reviewed office note from primary 12/14/17 only complaints dry cough since December Rx doxycyline and azithromycin. Anti histamine added. Also recurrent herpes simplex on right buttocks valtrex re-ordered Has two grandson's one [name] married living in [place] and one [name] has been in [place] and [place] cooking with wife  Dr Raliegh Ip retiring and will start to see Dr Yong Channel. Reviewed her lab work from October and March with her  LDL 113 ok with no known vascular disease and age     Current Meds  Medication Sig  . CALCIUM-MAG-VIT C-VIT D PO Take 15 mLs by mouth daily.  Marland Kitchen esomeprazole (NEXIUM) 40 MG capsule Take 40 mg by mouth every other day.  . ipratropium (ATROVENT) 0.06 % nasal spray Place 2 sprays into both nostrils 4 (four) times daily.  . Omega-3 Fatty Acids (FISH OIL PO) Take 5 mLs by mouth daily.  . valACYclovir (VALTREX) 500 MG tablet Take 1 tablet (500 mg total) by mouth daily.   Allergies  Allergen Reactions  . Chlorhexidine Gluconate Rash   Past Medical History:  Diagnosis Date  . Aneurysm of ascending aorta ([place])    a. 4.2-4.3cm in 2014.  Marland Kitchen Anxiety   . Breast cancer ([place]) 2018   right mastectomy  . Cancer ([place]) 1980   ovarian cancer  . Elevated BP Transient 09/2014  . Genetic testing 07/09/2017   Multi-Cancer panel (83 genes) @ [facility] - No pathogenic mutations detected  . GERD (gastroesophageal reflux disease)   . H/O bone density study    2015  .  H/O colonoscopy   . History of ovarian cancer    November 1979  . 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 Insomnia   . Normal coronary arteries Cath - 09/2014  . Osteoporosis   . Raynaud's disease    Family History  Problem Relation Age of Onset  . Healthy Mother   . Healthy Brother   . Breast cancer Cousin 7       mat first cousin related through uncle  . Esophageal cancer Cousin 62       mat first cousin related through uncle   Past Surgical History:  Procedure Laterality Date  . ABDOMINAL HYSTERECTOMY    . APPENDECTOMY    . BILATERAL SALPINGOOPHORECTOMY    . BREAST LUMPECTOMY    . CARDIAC CATHETERIZATION    . LEFT HEART CATHETERIZATION WITH CORONARY ANGIOGRAM N/A 10/17/2014   Procedure: LEFT HEART CATHETERIZATION WITH CORONARY ANGIOGRAM;  Surgeon: [name], MD;  Location: [facility] CATH LAB;  Service: Cardiovascular;  Laterality: N/A;  . MASTECTOMY Right    2017  . MASTECTOMY W/ SENTINEL NODE BIOPSY Right 05/05/2017   total mastectomy  . ovarian ca s/p taj/bso  1979  . SIMPLE MASTECTOMY WITH AXILLARY SENTINEL NODE BIOPSY Right 05/05/2017   Procedure: RIGHT TOTAL MASTECTOMY WITH RIGHT  AXILLARY SENTINEL NODE BIOPSY;  Surgeon: [name], MD;  Location: [place];  Service: General;  Laterality: Right;  PECTORAL BLOCK  Social History   Socioeconomic History  . Marital status: Married    Spouse name: Publishing copy  . Number of children: 2  . Years of education: Not on file  . Highest education level: Not on file  Occupational History  . Occupation: retired    Fish farm manager: RETIRED  Social Needs  . Financial resource strain: Not on file  . Food insecurity:    Worry: Not on file    Inability: Not on file  . Transportation needs:    Medical: Not on file    Non-medical: Not on file  Tobacco Use  . Smoking status: Never Smoker  . Smokeless tobacco: Never Used  Substance and Sexual Activity  . Alcohol use: No  .  Drug use: No  . Sexual activity: Not on file  Lifestyle  . Physical activity:    Days per week: Not on file    Minutes per session: Not on file  . Stress: Not on file  Relationships  . Social connections:    Talks on phone: Not on file    Gets together: Not on file    Attends religious service: Not on file    Active member of club or organization: Not on file    Attends meetings of clubs or organizations: Not on file    Relationship status: Not on file  . Intimate partner violence:    Fear of current or ex partner: Not on file    Emotionally abused: Not on file    Physically abused: Not on file    Forced sexual activity: Not on file  Other Topics Concern  . Not on file  Social History Narrative   She and her husband are from the [place] of [place].`   Children: [name] Leanos age 49, lives in [place], [place], age 49, lives in [place], [place]     Review of Systems: General: negative for chills, fever, night sweats or weight changes.  Cardiovascular: negative for chest pain, dyspnea on exertion, edema, orthopnea, palpitations, paroxysmal nocturnal dyspnea or shortness of breath Dermatological: negative for rash Respiratory: negative for cough or wheezing Urologic: negative for hematuria Abdominal: negative for nausea, vomiting, diarrhea, bright red blood per rectum, melena, or hematemesis Neurologic: negative for visual changes, syncope, or dizziness All other systems reviewed and are otherwise negative except as noted above.   Physical Exam:  Blood pressure 132/74, pulse 76, height 5\' 7"  (1.702 m), weight 144 lb (65.3 kg), SpO2 92 %.  Affect appropriate Healthy:  appears stated age 15: normal Neck supple with no adenopathy JVP normal no bruits no thyromegaly Lungs clear with no wheezing and good diaphragmatic motion Heart:  S1/S2 SEM  murmur, no rub, gallop or click PMI normal post right mastectomy  Abdomen: benighn, BS positve, no tenderness,  no AAA no bruit.  No HSM or HJR Distal pulses intact with no bruits No edema Neuro non-focal Skin warm and dry No muscular weakness   EKG SR with 1st degree AVB. RBBB. 07/22/17 SR rate 70 RBBB LAE   ASSESSMENT AND PLAN:   1. Dizziness/ Lightheadedness: non cardiac benign holter 09/25/15 normal exam and no CAD at cath 2016   2. H/o PVCs/PAC: benign asymptomatic for most part PRN Inderal   3. Breast CA:  Post right mastectomy [name] 05/05/17 F/U oncology   4. Stress: related to aging and husbands diagnosis of lung cancer Improving   5. Thoracic aortic aneurysm: followed by Dr. [name]. MRA 12/10/16 Aorta 4.1 cm  6. Murmur:  Benign aortic sclerosis murmur Echo 2016 mild MR no AS    7. Cholesterol:  LDL 113 at her age with no CAD or bad vascular disease would not Rx with statin Which is also her preference   PLAN  She has her 6 month f/u with me   Jenkins Rouge

## 2018-01-04 ENCOUNTER — Ambulatory Visit
Admission: RE | Admit: 2018-01-04 | Discharge: 2018-01-04 | Disposition: A | Discharge status: Home / Self Care | Payer: Medicare Other | Source: Ambulatory Visit | Attending: General Surgery | Admitting: General Surgery

## 2018-01-04 DIAGNOSIS — Z853 Personal history of malignant neoplasm of breast: Secondary | ICD-10-CM

## 2018-01-04 DIAGNOSIS — N644 Mastodynia: Secondary | ICD-10-CM

## 2018-01-06 ENCOUNTER — Ambulatory Visit (INDEPENDENT_AMBULATORY_CARE_PROVIDER_SITE_OTHER): Payer: Medicare Other | Admitting: Cardiovascular Disease

## 2018-01-06 ENCOUNTER — Encounter: Payer: Self-pay | Admitting: Cardiovascular Disease

## 2018-01-06 VITALS — BP 132/74 | HR 76 | Ht 67.0 in | Wt 144.0 lb

## 2018-01-06 DIAGNOSIS — R42 Dizziness and giddiness: Secondary | ICD-10-CM | POA: Diagnosis not present

## 2018-01-06 DIAGNOSIS — E7849 Other hyperlipidemia: Secondary | ICD-10-CM | POA: Diagnosis not present

## 2018-01-06 DIAGNOSIS — R011 Cardiac murmur, unspecified: Secondary | ICD-10-CM

## 2018-01-06 NOTE — Patient Instructions (Signed)

## 2018-01-18 ENCOUNTER — Ambulatory Visit: Payer: Medicare Other | Admitting: Family Medicine

## 2018-01-21 ENCOUNTER — Encounter: Payer: Self-pay | Admitting: Family Medicine

## 2018-01-21 ENCOUNTER — Ambulatory Visit (INDEPENDENT_AMBULATORY_CARE_PROVIDER_SITE_OTHER): Payer: Medicare Other

## 2018-01-21 ENCOUNTER — Ambulatory Visit (INDEPENDENT_AMBULATORY_CARE_PROVIDER_SITE_OTHER): Payer: Medicare Other | Admitting: Family Medicine

## 2018-01-21 VITALS — BP 108/76 | HR 66 | Temp 98.0°F | Ht 67.0 in | Wt 142.6 lb

## 2018-01-21 DIAGNOSIS — J302 Other seasonal allergic rhinitis: Secondary | ICD-10-CM

## 2018-01-21 DIAGNOSIS — R05 Cough: Secondary | ICD-10-CM

## 2018-01-21 DIAGNOSIS — R053 Chronic cough: Secondary | ICD-10-CM

## 2018-01-21 DIAGNOSIS — J810 Acute pulmonary edema: Secondary | ICD-10-CM | POA: Diagnosis not present

## 2018-01-21 MED ORDER — ESOMEPRAZOLE MAGNESIUM 40 MG PO CPDR: 40.0000 mg | DELAYED_RELEASE_CAPSULE | ORAL | 3 refills | Status: DC

## 2018-01-21 MED ORDER — PREDNISONE 20 MG PO TABS: ORAL_TABLET | ORAL | 0 refills | Status: DC

## 2018-01-21 NOTE — Progress Notes (Signed)
Subjective:  Cindy Schwartz is a 84 y.o. year old very pleasant female patient who presents for/with See problem oriented charting ROS-no fever or chills.  No shortness of breath.  Does have chronic cough.  No chest pain.  Past Medical History-  Patient Active Problem List   Diagnosis Date Noted  . Chronic cough 01/21/2018    Priority: Medium  . Varicose veins of lower extremities with other complications 12/87/8676    Priority: Low  . Raynaud's syndrome 07/01/2007    Priority: Low  . Genetic testing 07/09/2017  . Breast cancer, right ([place]) 05/05/2017  . Closed compression fracture of L1 lumbar vertebra 03/18/2017  . Breast cancer of lower-outer quadrant of right female breast ([place]) 01/16/2016  . Palpitations 11/13/2014  . Normal coronary arteries 10/17/2014  . Chest pain   . Hx of herpes simplex infection 12/18/2011  . COMPRESSION FRACTURE, THORACIC VERTEBRA 07/24/2010  . BACK PAIN, LUMBAR 06/28/2010  . NECK AND BACK PAIN 01/18/2010  . OSTEOARTHROSIS UNSPEC WHETHER GEN/LOC LOWER LEG 09/05/2009  . [place] REGION 02/02/2009  . GENERALIZED ANXIETY DISORDER 10/13/2008  . IRRITABLE BOWEL SYNDROME 10/13/2008  . HERPES SIMPLEX INFECTION 08/24/2008  . LUNG NODULE 11/25/2007  . Aneurysm of thoracic aorta ([place]) 05/06/2007  . Allergic rhinitis 05/06/2007  . Esophageal reflux 05/06/2007  . DEGENERATIVE DISC DISEASE, CERVICAL SPINE 05/06/2007  . Osteoporosis 03/18/2007  . History of ovarian cancer 09/22/1977    Medications- reviewed and updated Current Outpatient Medications  Medication Sig Dispense Refill  . CALCIUM-MAG-VIT C-VIT D PO Take 15 mLs by mouth daily.    Marland Kitchen esomeprazole (NEXIUM) 40 MG capsule Take 1 capsule (40 mg total) by mouth every other day. 90 capsule 3  . ipratropium (ATROVENT) 0.06 % nasal spray Place 2 sprays into both nostrils 4 (four) times daily.    . Omega-3 Fatty Acids (FISH OIL PO) Take 5 mLs by mouth daily.    . valACYclovir  (VALTREX) 500 MG tablet Take 1 tablet (500 mg total) by mouth daily. 90 tablet 3   Objective: BP 108/76 (BP Location: Left Arm, Patient Position: Sitting, Cuff Size: Normal)   Pulse 66   Temp 98 F (36.7 C) (Oral)   Ht 5\' 7"  (1.702 m)   Wt 142 lb 9.6 oz (64.7 kg)   SpO2 96%   BMI 22.33 kg/m  Gen: NAD, resting comfortably Oropharynx and nares normal CV: RRR no murmurs rubs or gallops Lungs: Faint intermittent wheeze. No crackles or rhonchi Abdomen: soft/nontender/nondistended/normal bowel sounds. No rebound or guarding.  Ext: no edema Skin: warm, dry  Dg Chest 2 View  Result Date: 01/21/2018 CLINICAL DATA:  Cough for several months, chest tightness, history of breast carcinoma and right mastectomy EXAM: CHEST - 2 VIEW COMPARISON:  Chest x-ray of 09/23/2017 FINDINGS: No pneumonia or effusion is seen. Very mild fluid overload cannot be excluded with some indistinctness and fullness of the perihilar vasculature. Mediastinal hilar contours are unremarkable. Heart size is stable. No acute bony abnormality is seen. IMPRESSION: 1. No active lung disease. 2. Cannot exclude very mild fluid overload. Electronically Signed   By: Ivar Drape M.D.   On: 01/21/2018 13:49   Assessment/Plan:  Chronic cough Acute pulmonary edema ([place]) - Plan: ECHOCARDIOGRAM COMPLETE S: started with a cold and bronchitis back in December. Has taken antibiotics twice but never got over it completely. She also has an overall weakness. Feels like she has central chest congestion and unable to cough it up.   Has had  6 visits since January 09/23/17- Patient had chest x-ray on January 2nd- noted to have hyperinflation of lungs. No pneumonia at that time.  Declines being smoker- husband was smoker but didn't smoke in the house.  She was given doxycycline at that time.   10/02/17. Dr. Maudie Mercury- flu test negative. Advised trial of albuterol- she never took taht.   10/06/17 saw Dr. Raliegh Ip and thought URI improving. She was reassured about  not having COPD  11/27/17 Dr. Regis Bill saw her and thought she had a flu like illlness  12/05/17 by margaret arnett- she was given azithromycin at that point.   12/14/17- She came back 12/14/17 to see Dr. Raliegh Ip for upper airway cough syndrome- she was continued on Nexium (on for 30 years) and was tried on zyrtec but had no improvement in the cough.    Future Appointments  Date Time Provider [place]  05/25/2018  8:15 AM Marin Olp, MD [facility]  May need sooner follow-up depending on work-up.  Lab/Order associations: Chronic cough - Plan: DG Chest 2 View, ECHOCARDIOGRAM COMPLETE  Acute pulmonary edema ([facility]) - Plan: ECHOCARDIOGRAM COMPLETE  Meds ordered this encounter  Medications  . esomeprazole (NEXIUM) 40 MG capsule    Sig: Take 1 capsule (40 mg total) by mouth every other day.    Dispense:  90 capsule    Refill:  3  . predniSONE (DELTASONE) 20 MG tablet    Sig: Take 1 tablet by mouth daily for 5 days, then 1/2 tablet daily for 2 days    Dispense:  6 tablet    Refill:  0   Return precautions advised.  Garret Reddish, MD

## 2018-01-21 NOTE — Patient Instructions (Addendum)
Health Maintenance Due  Topic Date Due  . DEXA SCAN - Patient had it done with [facility]. Our team will call and request records.  12/15/2000  . PNA vac Low Risk Adult (2 of 2 - PCV13) - Patient declined until next appointment.  12/20/2013   Stop by x-ray area before you leave  If x-ray is normal like it was last time-we will plan on a trial of prednisone for 7 days.  I sent this in for you today but do not take until you hear about the x-ray  If this fails to resolve your symptoms, I will refer you to Dr. Melvyn Novas pulmonary who specializes in chronic cough

## 2018-01-21 NOTE — Assessment & Plan Note (Signed)
Acute pulmonary edema ([place]) - Plan: ECHOCARDIOGRAM COMPLETE S: started with a cold and bronchitis back in December. Has taken antibiotics twice but never got over it completely. She also has an overall weakness. Feels like she has central chest congestion and unable to cough it up.   Has had 6 visits since January 09/23/17- Patient had chest x-ray on January 2nd- noted to have hyperinflation of lungs. No pneumonia at that time.  Declines being smoker- husband was smoker but didn't smoke in the house.  She was given doxycycline at that time.   10/02/17. Dr. Maudie Mercury- flu test negative. Advised trial of albuterol- she never took taht.   10/06/17 saw Dr. Raliegh Ip and thought URI improving. She was reassured about not having COPD  11/27/17 Dr. Regis Bill saw her and thought she had a flu like illlness  12/05/17 by margaret arnett- she was given azithromycin at that point.   12/14/17- She came back 12/14/17 to see Dr. Raliegh Ip for upper airway cough syndrome- she was continued on Nexium (on for 30 years) and was tried on zyrtec but had no improvement in the cough.

## 2018-01-21 NOTE — Progress Notes (Signed)
It is possible there is too much fluid in the lungs.  Sometimes this can be caused from back-up of fluid from the heart.  I have ordered an ultrasound of your heart to get additional information.  You should get a call from cardiology about this within a week or so.  Hold off on the prednisone until we get the results of the echocardiogram

## 2018-01-25 ENCOUNTER — Ambulatory Visit (HOSPITAL_COMMUNITY): Payer: Medicare Other | Attending: Cardiovascular Disease

## 2018-01-25 ENCOUNTER — Other Ambulatory Visit: Payer: Self-pay

## 2018-01-25 DIAGNOSIS — R053 Chronic cough: Secondary | ICD-10-CM

## 2018-01-25 DIAGNOSIS — I719 Aortic aneurysm of unspecified site, without rupture: Secondary | ICD-10-CM | POA: Diagnosis not present

## 2018-01-25 DIAGNOSIS — Z8543 Personal history of malignant neoplasm of ovary: Secondary | ICD-10-CM | POA: Insufficient documentation

## 2018-01-25 DIAGNOSIS — R002 Palpitations: Secondary | ICD-10-CM | POA: Insufficient documentation

## 2018-01-25 DIAGNOSIS — R05 Cough: Secondary | ICD-10-CM | POA: Insufficient documentation

## 2018-01-25 DIAGNOSIS — J810 Acute pulmonary edema: Secondary | ICD-10-CM | POA: Insufficient documentation

## 2018-01-25 DIAGNOSIS — Z853 Personal history of malignant neoplasm of breast: Secondary | ICD-10-CM | POA: Insufficient documentation

## 2018-01-25 NOTE — Progress Notes (Signed)
Echocardiogram shows good normal pumping function of the heart.  Also reasonable relaxation of the heart.  No cause for back-up of fluid was found.  You may start the prednisone.  Please let us know if symptoms have not improved after about 10 days

## 2018-01-28 ENCOUNTER — Telehealth: Payer: Self-pay | Admitting: Family Medicine

## 2018-01-28 NOTE — Telephone Encounter (Signed)
Copied from [place] [phone]. Topic: Quick Communication - Rx Refill/Question >> Jan 28, 2018  2:43 PM Robina Ade, Helene Kelp D wrote: Medication: esomeprazole (NEXIUM) 40 MG capsule but BRAND NAME for a 3 month supply. Has the patient contacted their pharmacy? Yes (Agent: If no, request that the patient contact the pharmacy for the refill.) Preferred Pharmacy (with phone number or street name):[facility], [place] Agent: Please be advised that RX refills may take up to 3 business days. We ask that you follow-up with your pharmacy.

## 2018-01-29 ENCOUNTER — Other Ambulatory Visit: Payer: Self-pay

## 2018-01-29 MED ORDER — ESOMEPRAZOLE MAGNESIUM 40 MG PO CPDR: 40.0000 mg | DELAYED_RELEASE_CAPSULE | ORAL | 3 refills | Status: DC

## 2018-01-29 NOTE — Telephone Encounter (Signed)
Patient is requesting name brand- that may require PA. Request sent to office for patient contact. Attempted to contact patient to find out why she needs name brand- left message to expect call regarding that from office.

## 2018-01-29 NOTE — Telephone Encounter (Signed)
See note

## 2018-01-29 NOTE — Telephone Encounter (Signed)
Called patient and sent in another [facility] Patient does not understand what the pharmacy means by send ing in the brand name. I informed her if she has any other issues to let us know and we will take care of it.

## 2018-05-11 ENCOUNTER — Telehealth: Payer: Self-pay | Admitting: Family Medicine

## 2018-05-11 DIAGNOSIS — R05 Cough: Secondary | ICD-10-CM

## 2018-05-11 DIAGNOSIS — R053 Chronic cough: Secondary | ICD-10-CM

## 2018-05-11 NOTE — Telephone Encounter (Signed)
Copied from [place] [id number]. Topic: General - Other >> May 11, 2018  3:47 PM Mcneil, Ja-Kwan wrote: Reason for CRM: Pt states she is still coughing and she would like to make Dr. Yong Channel aware. Pt requests a call back. Cb# [phone]

## 2018-05-12 ENCOUNTER — Other Ambulatory Visit: Payer: Self-pay

## 2018-05-12 DIAGNOSIS — R05 Cough: Secondary | ICD-10-CM

## 2018-05-12 DIAGNOSIS — R053 Chronic cough: Secondary | ICD-10-CM

## 2018-05-12 NOTE — Telephone Encounter (Signed)
Dr. Hunter, please see message and advise. 

## 2018-05-12 NOTE — Telephone Encounter (Signed)
May refer to Dr. Melvyn Novas of pulmonary under chronic cough

## 2018-05-12 NOTE — Telephone Encounter (Signed)
Spoke to pt, told her Dr. Yong Channel is going to refer you to Pulmonary to see Dr. Melvyn Novas. Told pt someone will contact you to schedule an appointment if you do not hear from someone by 3 weeks please let us know. Pt verbalized understanding. Order for referral in [place].

## 2018-05-12 NOTE — Telephone Encounter (Signed)
Referral has been placed to Dr. Melvyn Novas

## 2018-05-12 NOTE — Telephone Encounter (Signed)
Called pt back and told her we received message from her son regarding her cough via My Chart. Unfortunately we can not speak to your son due to he is not on the [facility]. Pt verbalized understanding. Told pt to please let son know that we did speak to you and have referred you to Pulmonary. Pt verbalized understanding. Told pt when she come into the office again and she fill at a DPR to add her son if she would like so we can speak to him Pt verbalized understanding.

## 2018-05-13 ENCOUNTER — Telehealth: Payer: Self-pay | Admitting: Internal Medicine

## 2018-05-13 NOTE — Telephone Encounter (Signed)
MR and Raquel Sarna please advise if we can use the held spots at 10:45am on 06-11-18-new consult. Thanks.   Pt is aware that sending message to MR to advise.

## 2018-05-13 NOTE — Telephone Encounter (Signed)
Anderson Malta please advise, is there a research patient for this time slot. If not we will add patient in. Thank you.

## 2018-05-13 NOTE — Telephone Encounter (Signed)
Please check with Anderson Malta as well to ensure no scheduled resarch slot. I favor accommodating her request

## 2018-05-14 ENCOUNTER — Encounter: Payer: Self-pay | Admitting: Internal Medicine

## 2018-05-14 ENCOUNTER — Ambulatory Visit (INDEPENDENT_AMBULATORY_CARE_PROVIDER_SITE_OTHER): Payer: Medicare Other | Admitting: Internal Medicine

## 2018-05-14 ENCOUNTER — Other Ambulatory Visit (INDEPENDENT_AMBULATORY_CARE_PROVIDER_SITE_OTHER): Payer: Medicare Other

## 2018-05-14 VITALS — BP 118/76 | HR 78 | Ht 65.5 in | Wt 133.6 lb

## 2018-05-14 DIAGNOSIS — R053 Chronic cough: Secondary | ICD-10-CM

## 2018-05-14 DIAGNOSIS — R05 Cough: Secondary | ICD-10-CM

## 2018-05-14 LAB — CBC WITH DIFFERENTIAL/PLATELET
BASOS PCT: 0.7 % (ref 0.0–3.0)
Basophils Absolute: 0.1 10*3/uL (ref 0.0–0.1)
EOS ABS: 0.3 10*3/uL (ref 0.0–0.7)
Eosinophils Relative: 3.8 % (ref 0.0–5.0)
HEMATOCRIT: 40.6 % (ref 36.0–46.0)
Hemoglobin: 13.6 g/dL (ref 12.0–15.0)
LYMPHS PCT: 26.9 % (ref 12.0–46.0)
Lymphs Abs: 2.3 10*3/uL (ref 0.7–4.0)
MCHC: 33.5 g/dL (ref 30.0–36.0)
MCV: 83.9 fl (ref 78.0–100.0)
MONOS PCT: 8.3 % (ref 3.0–12.0)
Monocytes Absolute: 0.7 10*3/uL (ref 0.1–1.0)
NEUTROS ABS: 5.1 10*3/uL (ref 1.4–7.7)
Neutrophils Relative %: 60.3 % (ref 43.0–77.0)
Platelets: 250 10*3/uL (ref 150.0–400.0)
RBC: 4.85 Mil/uL (ref 3.87–5.11)
RDW: 14.5 % (ref 11.5–15.5)
WBC: 8.4 10*3/uL (ref 4.0–10.5)

## 2018-05-14 LAB — NITRIC OXIDE: Nitric Oxide: 32

## 2018-05-14 MED ORDER — FLUTICASONE FUROATE-VILANTEROL 100-25 MCG/INH IN AEPB: 1.0000 | INHALATION_SPRAY | Freq: Every day | RESPIRATORY_TRACT | 11 refills | Status: DC

## 2018-05-14 MED ORDER — DOXYCYCLINE HYCLATE 100 MG PO TABS: 100.0000 mg | ORAL_TABLET | Freq: Two times a day (BID) | ORAL | 0 refills | Status: DC

## 2018-05-14 MED ORDER — PREDNISONE 10 MG PO TABS: ORAL_TABLET | ORAL | 0 refills | Status: DC

## 2018-05-14 NOTE — Telephone Encounter (Signed)
Spoke with Cindy Schwartz, no held research slots are open between now and 9/16 except for today. Per MR, if pt can make today's opening he is willing to see her. Called pt, she can make this appt.  appt scheduled.  Nothing further needed at this time.

## 2018-05-14 NOTE — Patient Instructions (Addendum)
ICD-10-CM   1. Chronic cough R05 POCT EXHALED NITRIC OXIDE    Spirometry with Graph    Likely cough variant asthma   Plan  - Take prednisone 40 mg daily x 2 days, then 20mg  daily x 2 days, then 10mg  daily x 2 days, then 5mg  daily x 2 days and stop  - Take doxycycline 100mg  po twice daily x 5 days; take after meals and avoid sunlight  - do blood for cbc with diff, IgE and blood allergy profile  - start low dose breo dail  - use albuterol as neeed  Followup - full PFT in 4-6 weeks - return to see DR Chase Caller in 4 weeks; Cough score/FeNo at followup - consider echo/ct at followup depending on course

## 2018-05-14 NOTE — Progress Notes (Signed)
Subjective:    Patient ID: Cindy Schwartz, female    DOB: 13-Dec-1933, 84 y.o.   MRN: [id number]  PCP Cindy Olp, MD   HPI   IOV 05/14/2018  Chief Complaint  Patient presents with  . Consult    Self referral due to a cough pt has had since December 2018. Pt states she is coughing up green mucus and has SOB and chest tightness associated with the cough.    Cindy Schwartz , 84 y.o. , with dob 03-Dec-1933 and female ,Not Hispanic or Latino from [place] [id number] - presents to lung clinic for  New consult for cough since dec 2018. Her husband Cindy Schwartz is my patient. She tells me that since December 2018 with insidious onset she's had chronic cough that is slowly deteriorating gotten worse. She's had 2 rounds of antibiotics that helped only partially. In April 2019 before going to [place] her native country she had a seven-day course of prednisone that did not help the symptoms overall. Cough is rated as mild to moderate but is worrying her extremely. She was waking up at night in the middle of the night with cough but not anymore. There is no clear cut aggravating factor or relieving factor. It is associated with wheeze and also green sputum. She feels that she has a lot of green mucus that she is unable to expectorate. She's had series of chest x-raysincluding one in January 2019 and May 2019. I personally visualized the May 2019 chest x-ray it is reported as clear with some haziness suggestive of possible fluid overload. She is not too keen on having another chest x-ray but she is worried about tumor possibility because of her husband who has lung cancer. I personally visualized this chest x-ray. She is not known to have any spring allergies. Medication history indicates that she is on fish oil which is not taking this. She's not on any ACE inhibitor's. No prior diagnosis of asthma RSI cough score documented below. Exhaled nitric oxide is borderline elevated at 32 ppb. Walking  desaturation test is normal. Office spirometry 05/14/2018 -> normal (visualized trace). No orthpnea, pnd, edema   Results for Cindy Schwartz, Cindy Schwartz (MRN [id number]) as of 05/14/2018 11:30  Ref. Range 07/15/2017 09:37 12/14/2017 09:29  Eosinophils Absolute Latest Ref Range: 0.0 - 0.7 K/uL 0.2 0.1     Dr Cindy Schwartz Reflux Symptom Index (> 13-15 suggestive of LPR cough) 05/14/2018 feno 32  Hoarseness of problem with voice 4  Clearing  Of Throat 4  Excess throat mucus or feeling of post nasal drip 5  Difficulty swallowing food, liquid or tablets 0  Cough after eating or lying down   Breathing difficulties or choking episodes 0  Troublesome or annoying cough 4  Sensation of something sticking in throat or lump in throat 0  Heartburn, chest pain, indigestion, or stomach acid coming up 2  TOTAL       Simple office walk 185 feet x  3 laps goal with forehead probe 05/14/2018   O2 used Room air  Number laps completed 3  Comments about pace Normal pace  Resting Pulse Ox/HR 100% and 70/min  Final Pulse Ox/HR 100% and 94/min  Desaturated </= 88% n  Desaturated <= 3% points no  Got Tachycardic >/= 90/min yes  Symptoms at end of test x  Miscellaneous comments x      has a past medical history of Aneurysm of ascending aorta ([place]), Anxiety, Breast cancer ([place]) (2018), Cancer ([place]) (  1980), Compression fracture of L1 lumbar vertebra ([place]), COMPRESSION FRACTURE, THORACIC VERTEBRA (07/24/2010), Elevated BP ([place] 09/2014), Genetic testing (07/09/2017), GERD (gastroesophageal reflux disease), H/O bone density study, H/O colonoscopy, History of ovarian cancer, History of ovarian cancer (1979), Insomnia, Normal coronary arteries ([place] - 09/2014), Osteoporosis, and Raynaud's disease.   reports that she has never smoked. She has never used smokeless tobacco.  Past Surgical History:  Procedure Laterality Date  . ABDOMINAL HYSTERECTOMY    . APPENDECTOMY    . BILATERAL SALPINGOOPHORECTOMY    . BREAST LUMPECTOMY     . CARDIAC CATHETERIZATION    . LEFT HEART CATHETERIZATION WITH CORONARY ANGIOGRAM N/A 10/17/2014   Procedure: LEFT HEART CATHETERIZATION WITH CORONARY ANGIOGRAM;  Surgeon: [name], MD;  Location: [facility] CATH LAB;  Service: Cardiovascular;  Laterality: N/A;  . MASTECTOMY Right    2017  . MASTECTOMY W/ SENTINEL NODE BIOPSY Right 05/05/2017   total mastectomy  . ovarian ca s/p taj/bso  1979  . SIMPLE MASTECTOMY WITH AXILLARY SENTINEL NODE BIOPSY Right 05/05/2017   Procedure: RIGHT TOTAL MASTECTOMY WITH RIGHT  AXILLARY SENTINEL NODE BIOPSY;  Surgeon: [name], MD;  Location: [facility];  Service: General;  Laterality: Right;  PECTORAL BLOCK    Allergies  Allergen Reactions  . Chlorhexidine Gluconate Rash    Immunization History  Administered Date(s) Administered  . Influenza Split 06/26/2011, 07/07/2012  . Influenza Whole 07/09/1999, 07/01/2007, 06/27/2008, 07/11/2009, 06/28/2010  . Influenza, High Dose Seasonal PF 07/13/2013, 06/26/2015, 07/15/2017  . Influenza,inj,Quad PF,6+ Mos 06/22/2014  . Influenza-Unspecified 08/15/2016  . Pneumococcal Polysaccharide-23 12/20/2012  . Td 09/23/2003  . Zoster 01/19/2013    Family History  Problem Relation Age of Onset  . Healthy Mother   . Healthy Brother   . Breast cancer Cousin 78       mat first cousin related through uncle  . Esophageal cancer Cousin 60       mat first cousin related through uncle     Current Outpatient Medications:  .  CALCIUM-MAG-VIT C-VIT D PO, Take 15 mLs by mouth daily., Disp: , Rfl:  .  esomeprazole (NEXIUM) 40 MG capsule, Take 1 capsule (40 mg total) by mouth every other day., Disp: 90 capsule, Rfl: 3 .  valACYclovir (VALTREX) 500 MG tablet, Take 1 tablet (500 mg total) by mouth daily., Disp: 90 tablet, Rfl: 3 .  Omega-3 Fatty Acids (FISH OIL PO), Take 5 mLs by mouth daily., Disp: , Rfl:      Review of Systems  Constitutional: Negative for fever and unexpected weight change.  HENT: Positive for  postnasal drip. Negative for congestion, dental problem, ear pain, nosebleeds, rhinorrhea, sinus pressure, sneezing, sore throat and trouble swallowing.   Eyes: Negative for redness and itching.  Respiratory: Positive for cough, chest tightness, shortness of breath and wheezing.   Cardiovascular: Negative for palpitations and leg swelling.  Gastrointestinal: Negative for nausea and vomiting.  Genitourinary: Negative for dysuria.  Musculoskeletal: Negative for joint swelling.  Skin: Negative for rash.  Allergic/Immunologic: Positive for environmental allergies. Negative for food allergies and immunocompromised state.  Neurological: Negative for headaches.  Hematological: Does not bruise/bleed easily.  Psychiatric/Behavioral: Negative for dysphoric mood. The patient is not nervous/anxious.        Objective:   Physical Exam  Vitals:   05/14/18 1103  BP: 118/76  Pulse: 78  SpO2: 100%  Weight: 133 lb 9.6 oz (60.6 kg)  Height: 5' 5.5" (1.664 m)    Estimated body mass index is 21.89 kg/m as  calculated from the following:   Height as of this encounter: 5' 5.5" (1.664 m).   Weight as of this encounter: 133 lb 9.6 oz (60.6 kg).       Assessment & Plan:     ICD-10-CM   1. Chronic cough R05 POCT EXHALED NITRIC OXIDE    Spirometry with Graph     Likely cough variant asthma  (good story with wheeze on exam but indertemrinate FeNO and normal spirometry)  Plan  - Take prednisone 40 mg daily x 2 days, then 20mg  daily x 2 days, then 10mg  daily x 2 days, then 5mg  daily x 2 days and stop  - Take doxycycline 100mg  po twice daily x 5 days; take after meals and avoid sunlight  - do blood for cbc with diff, IgE and blood allergy profile - to corroborate if allergic asthma  - start low dose breo dail  - use albuterol as neeed  Followup - full PFT in 4-6 weeks - return to see DR Chase Caller in 4 weeks; Cough score/FeNo at followup   Dr. Brand Males, M.D., [facility].C.P Pulmonary and  Critical Care Medicine Staff Physician, [place] Director - Interstitial Lung Disease  Program  Pulmonary [place] at [place], [place], [address]  Pager: [phone], If no answer or between  15:00h - 7:00h: call [phone] Telephone: [phone]

## 2018-05-17 ENCOUNTER — Telehealth: Payer: Self-pay | Admitting: Internal Medicine

## 2018-05-17 LAB — RESPIRATORY ALLERGY PROFILE REGION II ~~LOC~~
Allergen, Comm Silver Birch, t9: <0.1 kU/L
Allergen, Cottonwood, t14: <0.1 kU/L
Allergen, Mouse Urine Protein, e78: <0.1 kU/L
Allergen, Oak,t7: <0.1 kU/L
Aspergillus fumigatus, m3: <0.1 kU/L
Bermuda Grass: <0.1 kU/L
CLADOSPORIUM HERBARUM (M2) IGE: <0.1 kU/L
CLASS: 0
CLASS: 0
CLASS: 0
CLASS: 0
CLASS: 0
CLASS: 0
CLASS: 0
CLASS: 0
CLASS: 0
CLASS: 0
CLASS: 0
CLASS: 0
CLASS: 0
CLASS: 0
CLASS: 0
CLASS: 0
COMMON RAGWEED (SHORT) (W1) IGE: <0.1 kU/L
Cat Dander: <0.1 kU/L
Class: 0
Class: 0
Class: 0
Class: 0
Class: 0
Class: 0
Class: 0
Class: 0
Cockroach: <0.1 kU/L
Dog Dander: <0.1 kU/L
IgE (Immunoglobulin E), Serum: 19 kU/L (ref <=114)
Sheep Sorrel IgE: <0.1 kU/L
Timothy Grass: <0.1 kU/L

## 2018-05-17 LAB — INTERPRETATION:

## 2018-05-17 NOTE — Telephone Encounter (Signed)
Plan is for pft in 4-6 weeks but if she very worried about her chronic cough and no response to Rx regimen then please get  High Resolution CT chest without contrast on ILD protocol. DO both Supine and Prone Images. Only  Dr Lorin Picket or Dr Salvatore Marvel Dr. Vinnie Langton to read.   Also let her know blood allergy workup normal

## 2018-05-17 NOTE — Telephone Encounter (Signed)
Called patient, unable to reach left message to give us a call back. 

## 2018-05-17 NOTE — Telephone Encounter (Signed)
Called and spoke with patient, she states that someone from our office called her in regards to setting up a CAT scan. I do not see where this was supposed to happen.    MR please advise on this, thank you.

## 2018-05-19 NOTE — Telephone Encounter (Signed)
LMTCB

## 2018-05-19 NOTE — Telephone Encounter (Signed)
Spoke with patient and made aware of recommendations per MD Ramaswamy. Voiced understanding. Patient voiced she would get her PFT done first and if [facility] still felt she need the CAT scan she would discuss at a later time. Patient does have a scheduled appt for PFT with follow up with with MD Ramaswamy. Nothing further needed at this time.

## 2018-05-19 NOTE — Telephone Encounter (Signed)
Pt is returning call. Cb is [phone].

## 2018-05-25 ENCOUNTER — Ambulatory Visit (INDEPENDENT_AMBULATORY_CARE_PROVIDER_SITE_OTHER): Payer: Medicare Other | Admitting: Family Medicine

## 2018-05-25 ENCOUNTER — Encounter: Payer: Self-pay | Admitting: Family Medicine

## 2018-05-25 VITALS — BP 122/68 | HR 66 | Temp 98.2°F | Ht 65.0 in | Wt 128.2 lb

## 2018-05-25 DIAGNOSIS — Z0000 Encounter for general adult medical examination without abnormal findings: Secondary | ICD-10-CM | POA: Diagnosis not present

## 2018-05-25 DIAGNOSIS — I712 Thoracic aortic aneurysm, without rupture, unspecified: Secondary | ICD-10-CM

## 2018-05-25 DIAGNOSIS — R053 Chronic cough: Secondary | ICD-10-CM

## 2018-05-25 DIAGNOSIS — G8929 Other chronic pain: Secondary | ICD-10-CM

## 2018-05-25 DIAGNOSIS — M545 Low back pain, unspecified: Secondary | ICD-10-CM

## 2018-05-25 DIAGNOSIS — R05 Cough: Secondary | ICD-10-CM

## 2018-05-25 DIAGNOSIS — M818 Other osteoporosis without current pathological fracture: Secondary | ICD-10-CM

## 2018-05-25 DIAGNOSIS — Z0389 Encounter for observation for other suspected diseases and conditions ruled out: Secondary | ICD-10-CM

## 2018-05-25 DIAGNOSIS — K219 Gastro-esophageal reflux disease without esophagitis: Secondary | ICD-10-CM

## 2018-05-25 DIAGNOSIS — R911 Solitary pulmonary nodule: Secondary | ICD-10-CM | POA: Diagnosis not present

## 2018-05-25 DIAGNOSIS — K581 Irritable bowel syndrome with constipation: Secondary | ICD-10-CM

## 2018-05-25 DIAGNOSIS — J302 Other seasonal allergic rhinitis: Secondary | ICD-10-CM

## 2018-05-25 DIAGNOSIS — Z8619 Personal history of other infectious and parasitic diseases: Secondary | ICD-10-CM

## 2018-05-25 DIAGNOSIS — Z8543 Personal history of malignant neoplasm of ovary: Secondary | ICD-10-CM

## 2018-05-25 DIAGNOSIS — F411 Generalized anxiety disorder: Secondary | ICD-10-CM

## 2018-05-25 DIAGNOSIS — IMO0001 Reserved for inherently not codable concepts without codable children: Secondary | ICD-10-CM

## 2018-05-25 NOTE — Assessment & Plan Note (Signed)
S: thinks she may have taken medicine years ago. Recently controlled A/P: GAD7 under 5 today- continue to follow

## 2018-05-25 NOTE — Assessment & Plan Note (Addendum)
S: nexium every other day seems to help- wonders if cough is contributed to by this A/P: could trial nexium daily for next 2 months to see if improvement in cough. We could consider updating GI appointment if not improved with this- possible endoscopy

## 2018-05-25 NOTE — Assessment & Plan Note (Addendum)
S: Every other year does MRI/MRA with Dr. [name]. BP at goal A/P: continue cardiology follow up and BP target <130/80- at goal today

## 2018-05-25 NOTE — Progress Notes (Signed)
Phone: [phone]  Subjective:  Patient presents today for their annual physical and to establish care- most recently with Dr. Raliegh Ip and Dr. Arnoldo Morale. Chief complaint-noted.   See problem oriented charting- ROS- full  review of systems was completed and negative except for: post nasal drip, voice change, chest tightenss, cough, cold intolerance, seasonal allergies   The following were reviewed and entered/updated in epic: Past Medical History:  Diagnosis Date  . Aneurysm of ascending aorta ([place])    a. 4.2-4.3cm in 2014.  Marland Kitchen Anxiety   . Breast cancer ([place]) 2018   right mastectomy  . Cancer ([place]) 1980   ovarian cancer  . Compression fracture of L1 lumbar vertebra (H[facility])   . COMPRESSION FRACTURE, THORACIC VERTEBRA 07/24/2010   Qualifier: Diagnosis of  By: [name], [name]   . Elevated BP Transient 09/2014  . Genetic testing 07/09/2017   Multi-Cancer panel (83 genes) @ [facility] - No pathogenic mutations detected  . GERD (gastroesophageal reflux disease)   . H/O bone density study    2015  . H/O colonoscopy   . History of ovarian cancer    November 1979  . 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 Insomnia   . Normal coronary arteries Cath - 09/2014  . Osteoporosis   . Raynaud's disease    Patient Active Problem List   Diagnosis Date Noted  . history of Breast cancer of lower-outer quadrant of right female breast  01/16/2016    Priority: High  . Aneurysm of thoracic aorta ([place]) 05/06/2007    Priority: High  . History of ovarian cancer 09/22/1977    Priority: High  . Chronic cough 01/21/2018    Priority: Medium  . Normal coronary arteries 10/17/2014    Priority: Medium  . Esophageal reflux 05/06/2007    Priority: Medium  . Osteoporosis 03/18/2007    Priority: Medium  . Genetic testing 07/09/2017    Priority: Low  . Palpitations 11/13/2014    Priority: Low  . Varicose veins of lower extremities with other  complications 69/48/5462    Priority: Low  . Hx of herpes simplex infection 12/18/2011    Priority: Low  . BACK PAIN, LUMBAR 06/28/2010    Priority: Low  . Generalized anxiety disorder 10/13/2008    Priority: Low  . Irritable bowel syndrome 10/13/2008    Priority: Low  . Lung nodule 11/25/2007    Priority: Low  . Raynaud's syndrome 07/01/2007    Priority: Low  . Allergic rhinitis 05/06/2007    Priority: Low  . DEGENERATIVE DISC DISEASE, CERVICAL SPINE 05/06/2007    Priority: Low   Past Surgical History:  Procedure Laterality Date  . ABDOMINAL HYSTERECTOMY    . APPENDECTOMY    . BILATERAL SALPINGOOPHORECTOMY    . BREAST LUMPECTOMY    . LEFT HEART CATHETERIZATION WITH CORONARY ANGIOGRAM N/A 10/17/2014   Procedure: LEFT HEART CATHETERIZATION WITH CORONARY ANGIOGRAM;  Surgeon: [name], MD;  Location: [facility] CATH LAB;  Service: Cardiovascular;  Laterality: N/A;  . ovarian ca s/p taj/bso  1979  . SIMPLE MASTECTOMY WITH AXILLARY SENTINEL NODE BIOPSY Right 05/05/2017   Procedure: RIGHT TOTAL MASTECTOMY WITH RIGHT  AXILLARY SENTINEL NODE BIOPSY;  Surgeon: [name], MD;  Location: [facility];  Service: General;  Laterality: Right;  PECTORAL BLOCK    Family History  Problem Relation Age of Onset  . Hip fracture Mother   . Other Father        lived to  64  . Healthy Brother   . Breast cancer Cousin 34       mat first cousin related through uncle  . Esophageal cancer Cousin 21       mat first cousin related through uncle    Medications- reviewed and updated Current Outpatient Medications  Medication Sig Dispense Refill  . CALCIUM-MAG-VIT C-VIT D PO Take 15 mLs by mouth daily.    Marland Kitchen esomeprazole (NEXIUM) 40 MG capsule Take 1 capsule (40 mg total) by mouth every other day. 90 capsule 3  . Omega-3 Fatty Acids (FISH OIL PO) Take 5 mLs by mouth daily.    . valACYclovir (VALTREX) 500 MG tablet Take 1 tablet (500 mg total) by mouth daily. 90 tablet 3   No current  facility-administered medications for this visit.     Allergies-reviewed and updated Allergies  Allergen Reactions  . Chlorhexidine Gluconate Rash    Social History   Social History Narrative   Married. She and her husband are from the [place] area of [place]. Children: Galen Malkowski, lives in [place], Thayne, lives in [place], [place]. 4 grandkids. No greatgrandkids.       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     Objective: BP 122/68 (BP Location: Left Arm, Patient Position: Sitting, Cuff Size: Normal)   Pulse 66   Temp 98.2 F (36.8 C) (Oral)   Ht 5\' 5"  (1.651 m)   Wt 128 lb 3.2 oz (58.2 kg)   LMP  (LMP Unknown)   SpO2 98%   BMI 21.33 kg/m  Gen: NAD, resting comfortably HEENT: Mucous membranes are moist. Oropharynx normal Neck: no thyromegaly CV: RRR no murmurs rubs or gallops Lungs: CTAB no crackles, wheeze, rhonchi Abdomen: soft/nontender/nondistended/normal bowel sounds. No rebound or guarding.  Ext: no edema Skin: warm, dry Neuro: grossly normal, moves all extremities, PERRLA  Assessment/Plan:  84 y.o. female presenting for annual physical.  Health Maintenance counseling: 1. Anticipatory guidance: Patient counseled regarding regular dental exams -q6 months, eye exams - yearly , wearing seatbelts.  2. Risk factor reduction:  Advised patient of need for regular exercise and diet rich and fruits and vegetables to reduce risk of heart attack and stroke. Exercise- no exercise. Diet- has tried to eat better and very active lately- we discussed she is at a good healthy weight and would advise no further weight loss. We went over weight loss trend and if persistent would need further workup Wt Readings from Last 3 Encounters:  05/25/18 128 lb 3.2 oz (58.2 kg)  05/14/18 133 lb 9.6 oz (60.6 kg)  01/21/18 142 lb 9.6 oz (64.7 kg)  3. Immunizations/screenings/ancillary studies= flu  shot in the fall Immunization History  Administered Date(s) Administered  . Influenza Split 06/26/2011, 07/07/2012  . Influenza Whole 07/09/1999, 07/01/2007, 06/27/2008, 07/11/2009, 06/28/2010  . Influenza, High Dose Seasonal PF 07/13/2013, 06/26/2015, 07/15/2017  . Influenza,inj,Quad PF,6+ Mos 06/22/2014  . Influenza-Unspecified 08/15/2016  . Pneumococcal Polysaccharide-23 12/20/2012  . Td 09/23/2003  . Zoster 01/19/2013   Health Maintenance Due  Topic Date Due  . PNA vac Low Risk Adult (2 of 2 - PCV13)-declines for today-wants to check records to see if already received 12/20/2013  . INFLUENZA VACCINE -please call our office to schedule this in October/November 04/22/2018  4. Cervical cancer screening- passed age based screening 5. Breast cancer screening-  Up to date on mammograms  On left breastand  follows with Dr. [name]. 6. Colon cancer screening - she states was told no further colonoscopy 7. Skin cancer screening- Dr. Delman Cheadle. advised regular sunscreen use. Denies worrisome, changing, or new skin lesions.  8. Birth control/STD check- postmenopausal/monogomous 9. Osteoporosis screening at 24- get records   Status of chronic or acute concerns   Aneurysm of thoracic aorta (HCC) S: Every other year does MRI/MRA with Dr. [name]. BP at goal A/P: continue cardiology follow up and BP target <130/80- at goal today   Esophageal reflux S: nexium every other day seems to help- wonders if cough is contributed to by this A/P: could trial nexium daily for next 2 months to see if improvement in cough. We could consider updating GI appointment if not improved with this- possible endoscopy  Osteoporosis S: we need more info- osteoporosis noted from 10 years ago. On calcium/vitamin. 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 Recently controlled A/P: GAD7 under 5 today- continue to follow     Future Appointments  Date Time Provider [place]  06/21/2018  9:00 AM [facility] PFT ROOM [facility] None  06/21/2018 10:00 AM Brand Males, MD [facility] None   6 month follow up   Return precautions advised.  Garret Reddish, MD

## 2018-05-25 NOTE — Assessment & Plan Note (Signed)
S: we need more info- osteoporosis noted from 10 years ago. On calcium/vitamin. 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

## 2018-05-25 NOTE — Patient Instructions (Addendum)
Health Maintenance Due  Topic Date Due  . PNA vac Low Risk Adult (2 of 2 - PCV13)-declines for today-wants to check records to see if already received 12/20/2013  . INFLUENZA VACCINE -please call our office to schedule this in October/November 04/22/2018   . Please check with your pharmacy to see if they have the shingrix vaccine. If they do- please get this immunization and update [place] by phone call or mychart with dates you receive the vaccine   . Schedule a wellness visit on 07/15/18 or later with one of our nursing specialist.     could trial nexium daily for next 2 months to see if improvement in cough. We could consider updating GI appointment if not improved with this- possible endoscopy

## 2018-06-01 ENCOUNTER — Encounter: Payer: Self-pay | Admitting: Family Medicine

## 2018-06-09 ENCOUNTER — Institutional Professional Consult (permissible substitution): Payer: Medicare Other | Admitting: Internal Medicine

## 2018-06-21 ENCOUNTER — Ambulatory Visit (INDEPENDENT_AMBULATORY_CARE_PROVIDER_SITE_OTHER)
Admission: RE | Admit: 2018-06-21 | Discharge: 2018-06-21 | Disposition: A | Discharge status: Home / Self Care | Payer: Medicare Other | Source: Ambulatory Visit | Attending: Internal Medicine | Admitting: Internal Medicine

## 2018-06-21 ENCOUNTER — Ambulatory Visit (INDEPENDENT_AMBULATORY_CARE_PROVIDER_SITE_OTHER): Payer: Medicare Other | Admitting: Internal Medicine

## 2018-06-21 ENCOUNTER — Encounter: Payer: Self-pay | Admitting: Internal Medicine

## 2018-06-21 VITALS — BP 122/64 | HR 69 | Ht 65.5 in | Wt 138.0 lb

## 2018-06-21 DIAGNOSIS — R0982 Postnasal drip: Secondary | ICD-10-CM | POA: Diagnosis not present

## 2018-06-21 DIAGNOSIS — R053 Chronic cough: Secondary | ICD-10-CM

## 2018-06-21 DIAGNOSIS — K219 Gastro-esophageal reflux disease without esophagitis: Secondary | ICD-10-CM | POA: Diagnosis not present

## 2018-06-21 DIAGNOSIS — R05 Cough: Secondary | ICD-10-CM | POA: Diagnosis not present

## 2018-06-21 DIAGNOSIS — J387 Other diseases of larynx: Secondary | ICD-10-CM | POA: Diagnosis not present

## 2018-06-21 DIAGNOSIS — Z23 Encounter for immunization: Secondary | ICD-10-CM | POA: Diagnosis not present

## 2018-06-21 LAB — PULMONARY FUNCTION TEST
DL/VA % pred: 100 %
DL/VA: 5.01 ml/min/mmHg/L
DLCO UNC: 22.59 ml/min/mmHg
DLCO cor % pred: 85 %
DLCO cor: 22.46 ml/min/mmHg
DLCO unc % pred: 85 %
FEF 25-75 Post: 1.51 L/sec
FEF 25-75 Pre: 1.28 L/sec
FEF2575-%Change-Post: 17 %
FEF2575-%PRED-POST: 109 %
FEF2575-%Pred-Pre: 93 %
FEV1-%CHANGE-POST: 3 %
FEV1-%Pred-Post: 98 %
FEV1-%Pred-Pre: 95 %
FEV1-PRE: 1.91 L
FEV1-Post: 1.97 L
FEV1FVC-%CHANGE-POST: 6 %
FEV1FVC-%PRED-PRE: 100 %
FEV6-%Change-Post: -2 %
FEV6-%PRED-POST: 100 %
FEV6-%Pred-Pre: 102 %
FEV6-PRE: 2.61 L
FEV6-Post: 2.54 L
FEV6FVC-%CHANGE-POST: 0 %
FEV6FVC-%PRED-POST: 106 %
FEV6FVC-%Pred-Pre: 106 %
FVC-%CHANGE-POST: -2 %
FVC-%PRED-PRE: 97 %
FVC-%Pred-Post: 94 %
FVC-PRE: 2.61 L
FVC-Post: 2.54 L
POST FEV1/FVC RATIO: 78 %
POST FEV6/FVC RATIO: 100 %
PRE FEV6/FVC RATIO: 100 %
Pre FEV1/FVC ratio: 73 %
RV % PRED: 108 %
RV: 2.72 L
TLC % PRED: 97 %
TLC: 5.16 L

## 2018-06-21 NOTE — Addendum Note (Signed)
Addended by: Lorretta Harp on: 06/21/2018 10:48 AM   Modules accepted: Orders

## 2018-06-21 NOTE — Progress Notes (Signed)
PFT done today. 

## 2018-06-21 NOTE — Progress Notes (Signed)
HPI   IOV 05/14/2018  Chief Complaint  Patient presents with  . Consult    Self referral due to a cough pt has had since December 2018. Pt states she is coughing up green mucus and has SOB and chest tightness associated with the cough.    Cindy Schwartz , 84 y.o. , with dob 1934/08/21 and female ,Not Hispanic or Latino from [place] [address] - presents to lung clinic for  New consult for cough since dec 2018. Her husband Cindy Schwartz is my patient. She tells me that since December 2018 with insidious onset she's had chronic cough that is slowly deteriorating gotten worse. She's had 2 rounds of antibiotics that helped only partially. In April 2019 before going to [place] her native country she had a seven-day course of prednisone that did not help the symptoms overall. Cough is rated as mild to moderate but is worrying her extremely. She was waking up at night in the middle of the night with cough but not anymore. There is no clear cut aggravating factor or relieving factor. It is associated with wheeze and also green sputum. She feels that she has a lot of green mucus that she is unable to expectorate. She's had series of chest x-raysincluding one in January 2019 and May 2019. I personally visualized the May 2019 chest x-ray it is reported as clear with some haziness suggestive of possible fluid overload. She is not too keen on having another chest x-ray but she is worried about tumor possibility because of her husband who has lung cancer. I personally visualized this chest x-ray. She is not known to have any spring allergies. Medication history indicates that she is on fish oil which is not taking this. She's not on any ACE inhibitor's. No prior diagnosis of asthma RSI cough score documented below. Exhaled nitric oxide is borderline elevated at 32 ppb. Walking desaturation test is normal. Office spirometry 05/14/2018 -> normal (visualized trace). No orthpnea, pnd,  edema   Results for Cindy, Schwartz (MRN [id number]) as of 05/14/2018 11:30  Ref. Range 07/15/2017 09:37 12/14/2017 09:29  Eosinophils Absolute Latest Ref Range: 0.0 - 0.7 K/uL 0.2 0.1     OV 06/21/2018  Subjective:  Patient ID: Cindy Schwartz, female , DOB: [birth year] , age 71 y.o. , MRN: [id number] , ADDRESS: [place] [id number]   06/21/2018 -   Chief Complaint  Patient presents with  . Follow-up    PFT performed today.  Pt states she is doing better since last visit. States the cough is better. Pt does have postnasal drip and also states she has a lot of phlegm.     HPI Cindy Schwartz 84 y.o. -presents for follow-up of chronic cough.  At last visit we tried prednisone followed by empiric Breo.  She took the prednisone but did not take Brio.  She feels the prednisone helped resolve the cough.  She says her main problem is that she has significant clearing of the throat.  She says the cough itself is significantly better.  RSI cough score appears unchanged although she feels she is better.  The main issue is constant clearing of the throat.  This is mostly in the daytime.  She does not wake up in the middle of the night because of this unless there is associated postnasal drip happens intermittently.  Nasal steroids help it but she does not like to take it because she does not like medicines.  Last  visit recommended CT scan of the chest but she is held off.  She is not taking fish oil since April 2019 although this in the [facility].  She says the clearing of the throat is affecting her ability to sing in the church but she is content with that reduction in  lifestyle.  She does not feel the need to aggressively pursue an improvement so she can go back to singing in the church.  Last chest x-ray May 2019 showed some congestion but at this point she is denying shortness of breath or wheezing orthopnea proximal nocturnal dyspnea.     Dr Lorenza Cambridge Reflux Symptom Index (> 13-15 suggestive of LPR  cough) 05/14/2018 feno 32 06/21/2018   Hoarseness of problem with voice 4 3  Clearing  Of Throat 4 4  Excess throat mucus or feeling of post nasal drip 5 4  Difficulty swallowing food, liquid or tablets 0 0  Cough after eating or lying down  1  Breathing difficulties or choking episodes 0 0  Troublesome or annoying cough 4 3  Sensation of something sticking in throat or lump in throat 0   Heartburn, chest pain, indigestion, or stomach acid coming up 2 3  TOTAL 19 18      Simple office walk 185 feet x  3 laps goal with forehead probe 05/14/2018   O2 used Room air  Number laps completed 3  Comments about pace Normal pace  Resting Pulse Ox/HR 100% and 70/min  Final Pulse Ox/HR 100% and 94/min  Desaturated </= 88% n  Desaturated <= 3% points no  Got Tachycardic >/= 90/min yes  Symptoms at end of test x  Miscellaneous comments x     ROS - per HPI     has a past medical history of Aneurysm of ascending aorta (HCC), Anxiety, Breast cancer ([place]) (2018), Cancer ([place]) (1980), Compression fracture of L1 lumbar vertebra ([place]), COMPRESSION FRACTURE, THORACIC VERTEBRA (07/24/2010), Elevated BP (Transient 09/2014), Genetic testing (07/09/2017), GERD (gastroesophageal reflux disease), H/O bone density study, H/O colonoscopy, History of ovarian cancer, History of ovarian cancer (1979), Insomnia, Normal coronary arteries (Cath - 09/2014), Osteoporosis, and Raynaud's disease.   reports that she has never smoked. She has never used smokeless tobacco.  Past Surgical History:  Procedure Laterality Date  . ABDOMINAL HYSTERECTOMY    . APPENDECTOMY    . BILATERAL SALPINGOOPHORECTOMY    . BREAST LUMPECTOMY    . LEFT HEART CATHETERIZATION WITH CORONARY ANGIOGRAM N/A 10/17/2014   Procedure: LEFT HEART CATHETERIZATION WITH CORONARY ANGIOGRAM;  Surgeon: [name], MD;  Location[facility] CATH LAB;  Service: Cardiovascular;  Laterality: N/A;  . ovarian ca s/p taj/bso  1979  . SIMPLE MASTECTOMY WITH  AXILLARY SENTINEL NODE BIOPSY Right 05/05/2017   Procedure: RIGHT TOTAL MASTECTOMY WITH RIGHT  AXILLARY SENTINEL NODE BIOPSY;  Surgeon: [name], MD;  Location: [facility];  Service: General;  Laterality: Right;  PECTORAL BLOCK    Allergies  Allergen Reactions  . Chlorhexidine Gluconate Rash    Immunization History  Administered Date(s) Administered  . Influenza Split 06/26/2011, 07/07/2012  . Influenza Whole 07/09/1999, 07/01/2007, 06/27/2008, 07/11/2009, 06/28/2010  . Influenza, High Dose Seasonal PF 07/13/2013, 06/26/2015, 07/15/2017  . Influenza,inj,Quad PF,6+ Mos 06/22/2014  . Influenza-Unspecified 08/15/2016  . Pneumococcal Polysaccharide-23 12/20/2012  . Td 09/23/2003  . Zoster 01/19/2013    Family History  Problem Relation Age of Onset  . Hip fracture Mother   . Other Father        lived to  24  . Healthy Brother   . Breast cancer Cousin 29       mat first cousin related through uncle  . Esophageal cancer Cousin 59       mat first cousin related through uncle     Current Outpatient Medications:  .  CALCIUM PO, Take 1,000 mg by mouth., Disp: , Rfl:  .  CALCIUM-MAG-VIT C-VIT D PO, Take 15 mLs by mouth daily., Disp: , Rfl:  .  Cholecalciferol (VITAMIN D3) 1000 units CAPS, Take 1,000 Units by mouth daily., Disp: , Rfl:  .  esomeprazole (NEXIUM) 40 MG capsule, Take 1 capsule (40 mg total) by mouth every other day., Disp: 90 capsule, Rfl: 3 .  MAGNESIUM PO, Take 300 mg by mouth., Disp: , Rfl:  .  Omega-3 Fatty Acids (FISH OIL PO), Take 5 mLs by mouth daily., Disp: , Rfl:  .  valACYclovir (VALTREX) 500 MG tablet, Take 1 tablet (500 mg total) by mouth daily., Disp: 90 tablet, Rfl: 3      Objective:   Vitals:   06/21/18 1008  BP: 122/64  Pulse: 69  SpO2: 100%  Weight: 138 lb (62.6 kg)  Height: 5' 5.5" (1.664 m)    Estimated body mass index is 22.62 kg/m as calculated from the following:   Height as of this encounter: 5' 5.5" (1.664 m).   Weight as of this  encounter: 138 lb (62.6 kg).  @WEIGHTCHANGE @  [employer]   06/21/18 1008  Weight: 138 lb (62.6 kg)     Physical Exam  General Appearance:    Alert, cooperative, no distress, appears stated age - yes , Deconditioned looking - no , OBESE  - no, Sitting on Wheelchair -  no  Head:    Normocephalic, without obvious abnormality, atraumatic  Eyes:    PERRL, conjunctiva/corneas clear,  Ears:    Normal TM's and external ear canals, both ears  Nose:   Nares normal, septum midline, mucosa normal, no drainage    or sinus tenderness. OXYGEN ON  - no . Patient is @ ra   Throat:   Lips, mucosa, and tongue normal; teeth and gums normal. Cyanosis on lips - no  Neck:   Supple, symmetrical, trachea midline, no adenopathy;    thyroid:  no enlargement/tenderness/nodules; no carotid   bruit or JVD  Back:     Symmetric, no curvature, ROM normal, no CVA tenderness  Lungs:     Distress - no , Wheeze no, Barrell Chest - no, Purse lip breathing - no, Crackles - no   Chest Wall:    No tenderness or deformity.    Heart:    Regular rate and rhythm, S1 and S2 normal, no rub   or gallop, Murmur - no  Breast Exam:    NOT DONE  Abdomen:     Soft, non-tender, bowel sounds active all four quadrants,    no masses, no organomegaly. Visceral obesity - no  Genitalia:   NOT DONE  Rectal:   NOT DONE  Extremities:   Extremities - normal, Has Cane - no, Clubbing - no, Edema - no  Pulses:   2+ and symmetric all extremities  Skin:   Stigmata of Connective Tissue Disease - no  Lymph nodes:   Cervical, supraclavicular, and axillary nodes normal  Psychiatric:  Neurologic:   Pleasant - yes, Anxious - no, Flat affect - no  CAm-ICU - neg, Alert and Oriented x 3 - yes, Moves all 4s - yes, Speech - normal, Cognition -  intact           Assessment:       ICD-10-CM   1. Chronic cough R05   2. Post-nasal drip R09.82   3. Irritable larynx J38.7   4. Gastroesophageal reflux disease, esophagitis presence not specified  K21.9        Plan:     Patient Instructions  Chronic cough Post-nasal drip Gastroesophageal reflux disease, esophagitis presence not specified Irritable larynx  -Glad you are better.  The residual cough is caused by a combination of postnasal drip and possible acid reflux although it is controlled with Nexium and this is causing irritable throat or otherwise cough neuropathy making your airways extremely sensitive even to normal stimuli  -Not sure you have asthma based on negative allergy testing and the blood and indeterminate exhaled nitric oxide testing last visit  Plan  -Glad you are not taking fish oil; will take this off the med list -  take generic fluticasone inhaler 2 squirts each nostril daily -Do chest x-ray two-view today (in May 2019 they thought there was some fluid in the lungs]  -We will hold off on CT chest per your request -Recommend 2 days of total voice rest with her talking of the spring -At any time there is an urge to clear her throat of cough please drink water or suck on sugarless lozenge -Recommend voice rehab referral but respect your desire to hold off -If cough is unimproved then I would recommend have a [place] therapy for 2-4 months but I agree at this point we can hold off -High-dose flu shot June 21, 2018 today  Follow-up -4-6 months or sooner if needed; RSI cough score at follow-up       SIGNATURE    Dr. Brand Males, M.D., F.C.C.P,  Pulmonary and Critical Care Medicine Staff Physician, [place] Director - Interstitial Lung Disease  Program  Pulmonary [place] [facility] [place], [place], [address]  Pager: [phone], If no answer or between  15:00h - 7:00h: call [phone] Telephone: [phone]  10:32 AM 06/21/2018

## 2018-06-21 NOTE — Patient Instructions (Addendum)
Chronic cough Post-nasal drip Gastroesophageal reflux disease, esophagitis presence not specified Irritable larynx  -Glad you are better.  The residual cough is caused by a combination of postnasal drip and possible acid reflux although it is controlled with Nexium and this is causing irritable throat or otherwise cough neuropathy making your airways extremely sensitive even to normal stimuli  -Not sure you have asthma based on negative allergy testing and the blood and indeterminate exhaled nitric oxide testing last visit  Plan  -Glad you are not taking fish oil; will take this off the med list -  take generic fluticasone inhaler 2 squirts each nostril daily -Do chest x-ray two-view today (in May 2019 they thought there was some fluid in the lungs]  -We will hold off on CT chest per your request -Recommend 2 days of total voice rest with her talking of the spring -At any time there is an urge to clear her throat of cough please drink water or suck on sugarless lozenge -Recommend voice rehab referral but respect your desire to hold off -If cough is unimproved then I would recommend have a [place] therapy for 2-4 months but I agree at this point we can hold off -High-dose flu shot June 21, 2018 today  Follow-up -4-6 months or sooner if needed; RSI cough score at follow-up

## 2018-06-29 NOTE — Progress Notes (Signed)
07/08/2018 Cindy Schwartz   [birth year]  [id number]  Primary Physician Marin Olp, MD Primary Cardiologist: Dr. Johnsie Cancel    Reason for Visit/CC: Dizziness, lightheadedness  HPI:  F/U palpitations, dizziness and aortic root dilatation   84 y.o. with history of HTN, fusiform aortic root dilatation 4.1 cm.  Chest pain January 2016 with normal cardiac catheterization. Palpitations with monitor only showing PACs;/PVC;s  Breast cancer with mastectomy August 2018. Husband diagnosed with lung cancer at same time. Reviewed office note from primary 12/14/17 only complaints dry cough since December Rx doxycyline and azithromycin. Anti histamine added. Also recurrent herpes simplex on right buttocks valtrex re-ordered Has two grandson's one [name] married living in [place] and one [name] has been in [place] and [place] cooking with wife  Dr Yong Channel ordered TTE 01/25/18 for her chronic cough and it was normal with EF 60-65% no valve disease reviewed She was started on prednisone Seen by Chase Caller pulmonary on 06/21/18 She is not on ACE and had normal PFTls And normal walk test with no desats.  ? Post nasal drip and reflux as cause   Doing well with no complaints today Likes to eat her Mozzarella cheese    Current Meds  Medication Sig  . CALCIUM PO Take 1,000 mg by mouth.  Marland Kitchen CALCIUM-MAG-VIT C-VIT D PO Take 15 mLs by mouth daily.  . Cholecalciferol (VITAMIN D3) 1000 units CAPS Take 1,000 Units by mouth daily.  Marland Kitchen esomeprazole (NEXIUM) 40 MG capsule Take 1 capsule (40 mg total) by mouth every other day.  Marland Kitchen MAGNESIUM PO Take 300 mg by mouth.  . Omega-3 Fatty Acids (FISH OIL PO) Take 5 mLs by mouth daily.  . valACYclovir (VALTREX) 500 MG tablet Take 1 tablet (500 mg total) by mouth daily.   Allergies  Allergen Reactions  . Chlorhexidine Gluconate Rash   Past Medical History:  Diagnosis Date  . Aneurysm of ascending aorta ([place])    a. 4.2-4.3cm in 2014.  Marland Kitchen Anxiety   . Breast cancer ([place])  2018   right mastectomy  . Cancer ([place]) 1980   ovarian cancer  . Compression fracture of L1 lumbar vertebra ([facility])   . COMPRESSION FRACTURE, THORACIC VERTEBRA 07/24/2010   Qualifier: Diagnosis of  By: [name], [name]   . Elevated BP Transient 09/2014  . Genetic testing 07/09/2017   Multi-Cancer panel (83 genes) @ [facility] - No pathogenic mutations detected  . GERD (gastroesophageal reflux disease)   . H/O bone density study    2015  . H/O colonoscopy   . History of ovarian cancer    November 1979  . 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 Insomnia   . Normal coronary arteries Cath - 09/2014  . Osteoporosis   . Raynaud's disease    Family History  Problem Relation Age of Onset  . Hip fracture Mother   . Other Father        lived to 29  . Healthy Brother   . Breast cancer Cousin 78       mat first cousin related through uncle  . Esophageal cancer Cousin 28       mat first cousin related through uncle   Past Surgical History:  Procedure Laterality Date  . ABDOMINAL HYSTERECTOMY    . APPENDECTOMY    . BILATERAL SALPINGOOPHORECTOMY    . BREAST LUMPECTOMY    . LEFT HEART CATHETERIZATION WITH CORONARY ANGIOGRAM N/A 10/17/2014   Procedure: LEFT HEART CATHETERIZATION  WITH CORONARY ANGIOGRAM;  Surgeon: [name], MD;  Location[facility] C[facility] LAB;  Service: Cardiovascular;  Laterality: N/A;  . ovarian ca s/p taj/bso  1979  . SIMPLE MASTECTOMY WITH AXILLARY SENTINEL NODE BIOPSY Right 05/05/2017   Procedure: RIGHT TOTAL MASTECTOMY WITH RIGHT  AXILLARY SENTINEL NODE BIOPSY;  Surgeon: [name], MD;  Location: [place];  Service: General;  Laterality: Right;  PECTORAL BLOCK   Social History   Socioeconomic History  . Marital status: Married    Spouse name: Publishing copy  . Number of children: 2  . Years of education: Not on file  . Highest education level: Not on file  Occupational History  . Occupation: retired    Fish farm manager:  RETIRED  Social Needs  . Financial resource strain: Not on file  . Food insecurity:    Worry: Not on file    Inability: Not on file  . Transportation needs:    Medical: Not on file    Non-medical: Not on file  Tobacco Use  . Smoking status: Never Smoker  . Smokeless tobacco: Never Used  Substance and Sexual Activity  . Alcohol use: No  . Drug use: No  . Sexual activity: Not on file  Lifestyle  . Physical activity:    Days per week: Not on file    Minutes per session: Not on file  . Stress: Not on file  Relationships  . Social connections:    Talks on phone: Not on file    Gets together: Not on file    Attends religious service: Not on file    Active member of club or organization: Not on file    Attends meetings of clubs or organizations: Not on file    Relationship status: Not on file  . Intimate partner violence:    Fear of current or ex partner: Not on file    Emotionally abused: Not on file    Physically abused: Not on file    Forced sexual activity: Not on file  Other Topics Concern  . Not on file  Social History Narrative   Married. She and her husband are from the [place] area of [place]. Children: Tyla Burgner, lives in [place], Farrell, lives in [place], [place]. 4 grandkids. No greatgrandkids.       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      Review of Systems: General: negative for chills, fever, night sweats or weight changes.  Cardiovascular: negative for chest pain, dyspnea on exertion, edema, orthopnea, palpitations, paroxysmal nocturnal dyspnea or shortness of breath Dermatological: negative for rash Respiratory: negative for cough or wheezing Urologic: negative for hematuria Abdominal: negative for nausea, vomiting, diarrhea, bright red blood per rectum, melena, or hematemesis Neurologic: negative for visual changes, syncope, or dizziness All other systems  reviewed and are otherwise negative except as noted above.   Physical Exam:  Blood pressure 128/74, pulse 68, height 5' 5.5" (1.664 m), weight 141 lb 12.8 oz (64.3 kg), SpO2 98 %.  Affect appropriate Healthy:  appears stated age 26: normal Neck supple with no adenopathy JVP normal no bruits no thyromegaly Lungs clear with no wheezing and good diaphragmatic motion Heart:  S1/S2 SEM  murmur, no rub, gallop or click PMI normal post right mastectomy  Abdomen: benighn, BS positve, no tenderness, no AAA no bruit.  No HSM or HJR Distal pulses intact with no bruits No edema  Neuro non-focal Skin warm and dry No muscular weakness   EKG SR with 1st degree AVB. RBBB. 07/22/17 SR rate 70 RBBB LAE  07/08/18  SR rate 68 RBBB PR 208  ASSESSMENT AND PLAN:   1. Dizziness/ Lightheadedness: non cardiac benign holter 09/25/15 normal exam and no CAD at cath 2016   2. H/o PVCs/PAC: benign asymptomatic for most part PRN Inderal   3. Breast CA:  Post right mastectomy [name] 05/05/17 F/U oncology   4. Stress: related to aging and husbands diagnosis of lung cancer Improving   5. Thoracic aortic aneurysm: followed by Dr. [name]. MRA 12/10/16 Aorta 4.1 cm   6. Murmur:  Benign aortic sclerosis murmur TTE 01/25/18 no significant valve disease EF 60-65%    7. Cholesterol:  LDL 113 at her age with no CAD or bad vascular disease would not Rx with statin Which is also her preference   8. Cough:  Cyclic sees pulmonary continue flonase and anti reflux measures CXR suggested "fluid" but EF normal and not volume overloaded on exam no BNP done   PLAN  She has her 6 month f/u with me   Jenkins Rouge

## 2018-07-08 ENCOUNTER — Encounter: Payer: Self-pay | Admitting: Cardiovascular Disease

## 2018-07-08 ENCOUNTER — Ambulatory Visit (INDEPENDENT_AMBULATORY_CARE_PROVIDER_SITE_OTHER): Payer: Medicare Other | Admitting: Cardiovascular Disease

## 2018-07-08 VITALS — BP 128/74 | HR 68 | Ht 65.5 in | Wt 141.8 lb

## 2018-07-08 DIAGNOSIS — R002 Palpitations: Secondary | ICD-10-CM | POA: Diagnosis not present

## 2018-07-08 DIAGNOSIS — R42 Dizziness and giddiness: Secondary | ICD-10-CM

## 2018-07-08 DIAGNOSIS — E7849 Other hyperlipidemia: Secondary | ICD-10-CM | POA: Diagnosis not present

## 2018-07-08 NOTE — Patient Instructions (Signed)

## 2018-07-22 ENCOUNTER — Ambulatory Visit (INDEPENDENT_AMBULATORY_CARE_PROVIDER_SITE_OTHER): Payer: Medicare Other | Admitting: Podiatry

## 2018-07-22 ENCOUNTER — Encounter: Payer: Self-pay | Admitting: Podiatry

## 2018-07-22 ENCOUNTER — Ambulatory Visit (INDEPENDENT_AMBULATORY_CARE_PROVIDER_SITE_OTHER): Payer: Medicare Other

## 2018-07-22 ENCOUNTER — Other Ambulatory Visit: Payer: Self-pay | Admitting: Podiatry

## 2018-07-22 DIAGNOSIS — M778 Other enthesopathies, not elsewhere classified: Secondary | ICD-10-CM

## 2018-07-22 DIAGNOSIS — M779 Enthesopathy, unspecified: Secondary | ICD-10-CM | POA: Diagnosis not present

## 2018-07-22 DIAGNOSIS — M76812 Anterior tibial syndrome, left leg: Secondary | ICD-10-CM

## 2018-07-22 MED ORDER — TRIAMCINOLONE ACETONIDE 10 MG/ML IJ SUSP
10.0000 mg | Freq: Once | INTRAMUSCULAR | Status: AC
  Administered 2018-07-22: 10 mg

## 2018-07-22 NOTE — Progress Notes (Signed)
Subjective:   Patient ID: Cindy Schwartz, female   DOB: 84 y.o.   MRN: [id number]   HPI Patient presents stating she has developed a lot of pain around the left foot medial side anterior tibial insertion states is been sore for a few weeks and has no idea of the injury but it hard for her to walk on   ROS      Objective:  Physical Exam  Neuro vascular status intact with inflammation pain of the left anterior tib at its insertion into the cuneiform with muscle strength adequate with no indications of muscle dysfunction.  Patient has slight keratotic lesion plantar bilateral nonsymptomatic     Assessment:  Appears to be acute anterior tibial tendinitis left     Plan:  H&P x-ray reviewed and careful sheath injection administered after explaining chances for rupture.  I then applied fascial brace to lift up the arch instructed on ice reduced activity and supportive shoes and reappoint as needed utilize 3 mg Dexasone Kenalog 5 mg Xylocaine  X-ray indicates no signs of bony injury or pathology

## 2018-09-09 ENCOUNTER — Other Ambulatory Visit: Payer: Self-pay | Admitting: General Surgery

## 2018-09-09 ENCOUNTER — Ambulatory Visit (INDEPENDENT_AMBULATORY_CARE_PROVIDER_SITE_OTHER): Payer: Medicare Other | Admitting: Sports Medicine

## 2018-09-09 ENCOUNTER — Ambulatory Visit: Payer: Self-pay

## 2018-09-09 ENCOUNTER — Encounter: Payer: Self-pay | Admitting: Sports Medicine

## 2018-09-09 ENCOUNTER — Ambulatory Visit (INDEPENDENT_AMBULATORY_CARE_PROVIDER_SITE_OTHER): Payer: Medicare Other

## 2018-09-09 VITALS — BP 120/80 | HR 77 | Ht 65.5 in | Wt 143.0 lb

## 2018-09-09 DIAGNOSIS — M1711 Unilateral primary osteoarthritis, right knee: Secondary | ICD-10-CM

## 2018-09-09 DIAGNOSIS — M25561 Pain in right knee: Secondary | ICD-10-CM

## 2018-09-09 DIAGNOSIS — N644 Mastodynia: Secondary | ICD-10-CM

## 2018-09-09 DIAGNOSIS — M25572 Pain in left ankle and joints of left foot: Secondary | ICD-10-CM | POA: Diagnosis not present

## 2018-09-09 NOTE — Progress Notes (Signed)
Cindy Schwartz. Sarie Stall, [place] at [facility] [phone]  Carrah Eppolito - 84 y.o. female MRN [id number]  Date of birth: 22-Jan-1934  Visit Date: 09/09/2018  PCP: Marin Olp, MD   Referred by: Marin Olp, MD  SUBJECTIVE:   Chief Complaint  Patient presents with  . Initial Assessment  . R knee pain  . pain in L foot    Has seen Dr. Mariea Clonts (Podiatry) in the past. XR L foot 07/22/2018    HPI: Patient presents for right knee and left foot pain.  The right knee pain is been present for 2 to 3 months and is actually been worsening over the past several weeks.  It is rated as mild and described as a tightness.  It does seem slightly warm to touch.  Nonradiating.  She has worsening symptoms with exercise squatting and going from sit to stand.  She occasionally gets clicking but no locking or giving way.  She is taking ibuprofen.  Left foot has been painful for over 2 months.  Medial sided.  She had a steroid injection on 07/22/2018 with Dr. Mariea Clonts with only moderate improvement.  She has had worsening pain with weightbearing.  There is mild swelling.  Brace has not provided any significant relief.  REVIEW OF SYSTEMS: Full 12 point review of systems obtained today and is negative for nighttime disturbances, fevers chills or night sweats.  She does have a history of ovarian and right breast cancer.  Bilateral lower extremity edema is a chronic issue for her.  HISTORY:  Prior history reviewed and updated per electronic medical record.  Patient Active Problem List   Diagnosis Date Noted  . Chronic cough 01/21/2018    Seeing Dr. Chase Caller. Trial nexium daily I/o every other day    . Genetic testing 07/09/2017    Multi-Cancer panel (83 genes) @ [facility] - No pathogenic mutations detected  Genes Analyzed: 83 genes on [facility]'s Multi-Cancer panel (ALK, APC, ATM, AXIN2, BAP1, BARD1, BLM, BMPR1A, BRCA1, BRCA2, BRIP1, CASR, CDC73, CDH1, CDK4,  CDKN1B, CDKN1C, CDKN2A, CEBPA, CHEK2, CTNNA1, DICER1, DIS3L2, EGFR, EPCAM, FH, FLCN, GATA2, GPC3, GREM1, HOXB13, HRAS, KIT, MAX, MEN1, MET, MITF, MLH1, MSH2, MSH3, MSH6, MUTYH, NBN, NF1, NF2, NTHL1, PALB2, PDGFRA, PHOX2B, PMS2, POLD1, POLE, POT1, PRKAR1A, PTCH1, PTEN, RAD50, RAD51C, RAD51D, RB1, RECQL4, RET, RUNX1, SDHA, SDHAF2, SDHB, SDHC, SDHD, SMAD4, SMARCA4, SMARCB1, SMARCE1, STK11, SUFU, TERC, TERT, TMEM127, TP53, TSC1, TSC2, VHL, WRN, WT1).   . history of Breast cancer of lower-outer quadrant of right female breast  01/16/2016    Mastectomy 2018. No radiation or chemo. Now with Dr. [name]- local surgeon    . Palpitations 11/13/2014  . Normal coronary arteries 10/17/2014    Follows Dr. Johnsie Cancel every 6 months. Has had palpitations in past- PACs/PVCs   . Varicose veins of lower extremities with other complications 50/27/7412  . Hx of herpes simplex infection 12/18/2011    Suppressive valtrex 500 mg daily   . BACK PAIN, LUMBAR 06/28/2010    Occasional low back pain. History compression fracture L1. Also noted in problem list was thoracic compression fracture   . Generalized anxiety disorder 10/13/2008  . Irritable bowel syndrome 10/13/2008    States constipated all her life. Magnesium tends to help   . Lung nodule 11/25/2007    Not noted on MRI/MRA chest 2016    . Raynaud's syndrome 07/01/2007  . Aneurysm of thoracic aorta ([place]) 05/06/2007    Every other year does MRI/MRA  with cardiology    . Allergic rhinitis 05/06/2007    Chronic cough for years- started when moved to [place]   . Esophageal reflux 05/06/2007    nexium every other day    . DEGENERATIVE DISC DISEASE, CERVICAL SPINE 05/06/2007    Qualifier: Diagnosis of  By: [name], Balinda Quails    . Osteoporosis 03/18/2007    07/21/17 at [facility] -3.0 at lumbar spine, -2.4 left femur. fosmax for many years and notes "severe jaw damage" per [facility]    . History of ovarian cancer 09/22/1977    Age 86. 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    Social History   Occupational History  . Occupation: retired    Fish farm manager: RETIRED  Tobacco Use  . Smoking status: Never Smoker  . Smokeless tobacco: Never Used  Substance and Sexual Activity  . Alcohol use: No  . Drug use: No  . Sexual activity: Not on file   Social History   Social History Narrative   Married. She and her husband are from the [place] area of [place]. Children: Michaiah Maiden, lives in [place], [place], lives in [place], [place]. 4 grandkids. No greatgrandkids.       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    Past Medical History:  Diagnosis Date  . Aneurysm of ascending aorta ([place])    a. 4.2-4.3cm in 2014.  Marland Kitchen Anxiety   . Breast cancer ([place]) 2018   right mastectomy  . Cancer ([place]) 1980   ovarian cancer  . Compression fracture of L1 lumbar vertebra ([facility])   . COMPRESSION FRACTURE, THORACIC VERTEBRA 07/24/2010   Qualifier: Diagnosis of  By: [name], [name]   . Elevated BP Transient 09/2014  . Genetic testing 07/09/2017   Multi-Cancer panel (83 genes) @ [facility] - No pathogenic mutations detected  . GERD (gastroesophageal reflux disease)   . H/O bone density study    2015  . H/O colonoscopy   . History of ovarian cancer    November 1979  . 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 Insomnia   . Normal coronary arteries Cath - 09/2014  . Osteoporosis   . Raynaud's disease    Past Surgical History:  Procedure Laterality Date  . ABDOMINAL HYSTERECTOMY    . APPENDECTOMY    . BILATERAL SALPINGOOPHORECTOMY    . BREAST LUMPECTOMY    . LEFT HEART CATHETERIZATION WITH CORONARY ANGIOGRAM N/A 10/17/2014   Procedure: LEFT HEART CATHETERIZATION WITH CORONARY ANGIOGRAM;  Surgeon: [name], MD;  Location: [facility] CATH LAB;  Service: Cardiovascular;  Laterality: N/A;  .  ovarian ca s/p taj/bso  1979  . SIMPLE MASTECTOMY WITH AXILLARY SENTINEL NODE BIOPSY Right 05/05/2017   Procedure: RIGHT TOTAL MASTECTOMY WITH RIGHT  AXILLARY SENTINEL NODE BIOPSY;  Surgeon: [name], MD;  Location: [place];  Service: General;  Laterality: Right;  PECTORAL BLOCK   family history includes Breast cancer (age of onset: 52) in her cousin; Esophageal cancer (age of onset: 74) in her cousin; Healthy in her brother; Hip fracture in her mother; Other in her father. Recent Labs    12/14/17 0929  CREATININE 0.51  CALCIUM 9.6  AST 17  ALT 12    OBJECTIVE:  VS:  HT:5' 5.5" (166.4 cm)   WT:143 lb (64.9 kg)  BMI:23.43    BP:120/80  HR:77bpm  TEMP: ( )  RESP:98 %   PHYSICAL EXAM: CONSTITUTIONAL: Well-developed, Well-nourished and In no acute distress EYES: Pupils are equal., EOM intact without nystagmus. and No scleral icterus. Psychiatric: Alert & appropriately interactive. and Not depressed or anxious appearing. EXTREMITY EXAM: Warm and well perfused  Patient's right knee is overall well aligned without significant deformity.  She has a moderate amount of swelling with extensor mechanism is intact.  There is generalized synovitis.  She is ligamentously stable to varus and valgus strain.  Slight pain with McMurray's but this is minimal.  Her left ankle has a moderate amount of swelling that is diffuse.  She has pain over the anterior lateral ankle.  Her ankle drawer testing is stable.  She has no pain with Klieger testing does have some pain with talar tilt.   ASSESSMENT:   1. Right knee pain, unspecified chronicity   2. Primary osteoarthritis of right knee   3. Arthralgia of left ankle     PROCEDURES:  US Guided Injection per procedure note      PLAN:  Pertinent additional documentation may be included in corresponding procedure notes, imaging studies, problem based documentation and patient instructions.  Patient has right knee effusion and has mild  underlying degenerative changes on x-ray.  She should do well with the injection.  The lack of improvement can consider further diagnostic evaluation with MRI.  Her left ankle has a moderate degree of degenerative change as well and she will benefit from compression.  Body Helix Compression Sleeve compression sleeve provided today.  Activity modifications and the importance of avoiding exacerbating activities (limiting pain to no more than a 4 / 10 during or following activity) recommended and discussed.  Discussed red flag symptoms that warrant earlier emergent evaluation and patient voices understanding.  She will benefit from formal physical therapy for both ankle and knee as well as hip strengthening   Lab Orders  No laboratory test(s) ordered today   Imaging Orders     DG Knee AP/LAT W/Sunrise Right     [place] MSK POCT ULTRASOUND Referral Orders     Ambulatory referral to Physical Therapy  Return in about 6 weeks (around 10/21/2018).          Gerda Diss, [facility] Physician

## 2018-09-09 NOTE — Patient Instructions (Addendum)
You had an injection today.  Things to be aware of after injection are listed below: . You may experience no significant improvement or even a slight worsening in your symptoms during the first 24 to 48 hours.  After that we expect your symptoms to improve gradually over the next 2 weeks for the medicine to have its maximal effect.  You should continue to have improvement out to 6 weeks after your injection. . Dr. Paulla Fore recommends icing the site of the injection for 20 minutes  1-2 times the day of your injection . You may shower but no swimming, tub bath or Jacuzzi for 24 hours. . If your bandage falls off this does not need to be replaced.  It is appropriate to remove the bandage after 4 hours. . You may resume light activities as tolerated unless otherwise directed per Dr. Paulla Fore during your visit  POSSIBLE STEROID SIDE EFFECTS:  Side effects from injectable steroids tend to be less than when taken orally however you may experience some of the symptoms listed below.  If experienced these should only last for a short period of time. Change in menstrual flow  Edema (swelling)  Increased appetite Skin flushing (redness)  Skin rash/acne  Thrush (oral) Yeast vaginitis    Increased sweating  Depression Increased blood glucose levels Cramping and leg/calf  Euphoria (feeling happy)  POSSIBLE PROCEDURE SIDE EFFECTS: The side effects of the injection are usually fairly minimal however if you may experience some of the following side effects that are usually self-limited and will is off on their own.  If you are concerned please feel free to call the office with questions:  Increased numbness or tingling  Nausea or vomiting  Swelling or bruising at the injection site   Please call our office if if you experience any of the following symptoms over the next week as these can be signs of infection:   Fever greater than 100.59F  Significant swelling at the injection site  Significant redness or drainage  from the injection site  If after 2 weeks you are continuing to have worsening symptoms please call our office to discuss what the next appropriate actions should be including the potential for a return office visit or other diagnostic testing.   I recommend you obtained a compression sleeve to help with your joint problems. There are many options on the market however I recommend obtaining a ankle Body Helix compression sleeve.  You can find information (including how to appropriate measure yourself for sizing) can be found at www.Body http://www.lambert.com/.  Many of these products are health savings account (HSA) eligible.   You can use the compression sleeve at any time throughout the day but is most important to use while being active as well as for 2 hours post-activity.   It is appropriate to ice following activity with the compression sleeve in place.

## 2018-09-09 NOTE — Procedures (Signed)
PROCEDURE NOTE:  Ultrasound Guided: Injection: Right knee Images were obtained and interpreted by myself, Teresa Coombs, DO  Images have been saved and stored to PACS system. Images obtained on: GE S7 Ultrasound machine    ULTRASOUND FINDINGS:  Moderate effusion.  Generalized synovitis.  DESCRIPTION OF PROCEDURE:  The patient's clinical condition is marked by substantial pain and/or significant functional disability. Other conservative therapy has not provided relief, is contraindicated, or not appropriate. There is a reasonable likelihood that injection will significantly improve the patient's pain and/or functional impairment.   After discussing the risks, benefits and expected outcomes of the injection and all questions were reviewed and answered, the patient wished to undergo the above named procedure.  Verbal consent was obtained.  The ultrasound was used to identify the target structure and adjacent neurovascular structures. The skin was then prepped in sterile fashion and the target structure was injected under direct visualization using sterile technique as below:  Single injection performed as below: PREP: Alcohol, Ethel Chloride and 5 cc 1% lidocaine on 25g 1.5 in. needle APPROACH:superiolateral, stopcock technique, 18g 1.5 in. INJECTATE: 2 cc 0.5% Marcaine and 2 cc 40mg /mL DepoMedrol ASPIRATE: 47mL  and straw colored  DRESSING: Band-Aid  Post procedural instructions including recommending icing and warning signs for infection were reviewed.    This procedure was well tolerated and there were no complications.   IMPRESSION: Succesful Ultrasound Guided: Injection

## 2018-09-13 ENCOUNTER — Telehealth: Payer: Self-pay | Admitting: Family Medicine

## 2018-09-13 NOTE — Telephone Encounter (Signed)
Patient states she was seen on Thursday an was givena steroid injection in knee. Today her face is red and burning and she thinks it is a reaction. No numbness or tingling. Patien  PER [facility]

## 2018-09-17 NOTE — Telephone Encounter (Signed)
Called to follow up with patient. She states she is doing great. I advised her to call us if she needed anything and she verbalized understanding

## 2018-09-19 ENCOUNTER — Ambulatory Visit
Admission: RE | Admit: 2018-09-19 | Discharge: 2018-09-19 | Disposition: A | Discharge status: Home / Self Care | Payer: Medicare Other | Source: Ambulatory Visit | Attending: General Surgery | Admitting: General Surgery

## 2018-09-19 DIAGNOSIS — N644 Mastodynia: Secondary | ICD-10-CM

## 2018-09-19 MED ORDER — GADOBUTROL 1 MMOL/ML IV SOLN
7.0000 mL | Freq: Once | INTRAVENOUS | Status: AC | PRN
  Administered 2018-09-19: 7 mL via INTRAVENOUS

## 2018-10-21 ENCOUNTER — Encounter: Payer: Self-pay | Admitting: Sports Medicine

## 2018-10-21 ENCOUNTER — Ambulatory Visit (INDEPENDENT_AMBULATORY_CARE_PROVIDER_SITE_OTHER): Payer: Medicare Other

## 2018-10-21 ENCOUNTER — Ambulatory Visit: Payer: Medicare Other | Admitting: Sports Medicine

## 2018-10-21 ENCOUNTER — Ambulatory Visit (INDEPENDENT_AMBULATORY_CARE_PROVIDER_SITE_OTHER): Payer: Medicare Other | Admitting: Sports Medicine

## 2018-10-21 ENCOUNTER — Other Ambulatory Visit: Payer: Self-pay | Admitting: Surgery

## 2018-10-21 VITALS — BP 130/84 | HR 78 | Ht 65.5 in | Wt 145.0 lb

## 2018-10-21 DIAGNOSIS — M1711 Unilateral primary osteoarthritis, right knee: Secondary | ICD-10-CM

## 2018-10-21 DIAGNOSIS — M545 Low back pain, unspecified: Secondary | ICD-10-CM

## 2018-10-21 DIAGNOSIS — I712 Thoracic aortic aneurysm, without rupture, unspecified: Secondary | ICD-10-CM

## 2018-10-21 DIAGNOSIS — M25572 Pain in left ankle and joints of left foot: Secondary | ICD-10-CM

## 2018-10-21 DIAGNOSIS — M25561 Pain in right knee: Secondary | ICD-10-CM | POA: Diagnosis not present

## 2018-10-21 DIAGNOSIS — Z0289 Encounter for other administrative examinations: Secondary | ICD-10-CM

## 2018-10-21 MED ORDER — GABAPENTIN 100 MG PO CAPS: 200.0000 mg | ORAL_CAPSULE | Freq: Every day | ORAL | 1 refills | Status: DC

## 2018-10-21 NOTE — Progress Notes (Signed)
Cindy Schwartz - 85 y.o. female MRN [id number]  Date of birth: 04-07-1934  Visit Date: October 21, 2018  PCP: Marin Olp, MD   Referred by: Marin Olp, MD  SUBJECTIVE:  Chief Complaint  Patient presents with  . Follow-up    R knee pain.  R knee XR- 09/09/18.  R knee injection- 09/09/18    HPI: Patient reports that she had approximately 2 weeks of relief after the last knee injection.  She is now having pain that is radiating into the left foot.  It radiates from the lower leg into the foot and does seem to awaken her at night.  Pain is described as mild to moderate.  She associates this with some back pain.  There is both leg posterior leg pain that is associated as well.  The injection on 09/09/2018 did help her slightly with her right knee but her other symptoms began shortly thereafter.  REVIEW OF SYSTEMS: Per HPI Otherwise 12 point review of systems performed and is negative   HISTORY:  Prior history reviewed and updated per electronic medical record.  Social History   Occupational History  . Occupation: retired    Fish farm manager: RETIRED  Tobacco Use  . Smoking status: Never Smoker  . Smokeless tobacco: Never Used  Substance and Sexual Activity  . Alcohol use: No  . Drug use: No  . Sexual activity: Not on file   Social History   Social History Narrative   Married. She and her husband are from the [place] area of [place]. Children: Cindy Schwartz, lives in [place], [place], lives in [place], [place]. 4 grandkids. No greatgrandkids.       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     OBJECTIVE:  VS:  HT:5' 5.5" (166.4 cm)   WT:145 lb (65.8 kg)  BMI:23.75    BP:130/84  HR:78bpm  TEMP: ( )  RESP:97 %   PHYSICAL EXAM: Adult female.  No acute distress.  Alert and  appropriate. Right leg is well aligned with a small amount of osteoarthritic bossing of the knee.  No significant effusion.  She is ligamentously stable. Left foot is normal-appearing with a moderate high cavus arch.  She has good midfoot motion.  Slightly painful straight leg raise bilaterally.  Worse on the left than the right.   ASSESSMENT   1. Acute midline low back pain without sciatica   2. Right knee pain, unspecified chronicity   3. Primary osteoarthritis of right knee   4. Arthralgia of left ankle     PROCEDURES:  None  PLAN:  Pertinent additional documentation may be included in corresponding procedure notes, imaging studies, problem based documentation and patient instructions.  No problem-specific Assessment & Plan notes found for this encounter.  Symptoms are concerning for a radiculitis.  This is likely coming from her back contributing to both legs.  She undoubtedly has underlying knee osteoarthritis however the pain that is radiating into the foot and posterior aspect of bilateral legs is likely radicular in nature and will benefit from gabapentin and formal physical therapy.  Prescriptions for this provided today.  Referral to physical therapy placed today  Activity modifications and the importance of avoiding exacerbating activities (limiting pain to no more than a 4 / 10 during or following activity)  recommended and discussed.  Discussed red flag symptoms that warrant earlier emergent evaluation and patient voices understanding.   Meds ordered this encounter  Medications  . gabapentin (NEURONTIN) 100 MG capsule    Sig: Take 2 capsules (200 mg total) by mouth at bedtime.    Dispense:  180 capsule    Refill:  1   Lab Orders  No laboratory test(s) ordered today    Imaging Orders     DG Lumbar Spine Complete  Referral Orders     Ambulatory referral to Physical Therapy  Return in about 4 weeks (around 11/18/2018).  To ensure clinical  resolution/improvement         Gerda Diss, [facility] Physician

## 2018-10-22 ENCOUNTER — Encounter: Payer: Self-pay | Admitting: Sports Medicine

## 2018-10-24 ENCOUNTER — Encounter: Payer: Self-pay | Admitting: Sports Medicine

## 2018-10-28 ENCOUNTER — Telehealth: Payer: Self-pay | Admitting: Family Medicine

## 2018-10-28 NOTE — Telephone Encounter (Signed)
I called the patient and advised that we would waive the $50 fee for her out of courtesy for this visit as she has not had any prior no shows or same day cancellations however, she must call back once she receives the statement as nothing is in her account at this time for billing to to waive. Patient stated she understood and would call back once she receives the statement.   Copied from [place] [phone]. Topic: General - Other >> Oct 28, 2018 11:53 AM Cindy Schwartz wrote: Reason for CRM: Pt stated she received a letter regarding a missed appt on 10/21/18 but she was seen later on that same day. Pt feels she should not be charged for the appt when she was seen later that day. Pt requests that the $50 charge be removed. Pt requests call back.

## 2018-11-04 ENCOUNTER — Ambulatory Visit (INDEPENDENT_AMBULATORY_CARE_PROVIDER_SITE_OTHER): Payer: Medicare Other | Admitting: Family Medicine

## 2018-11-04 ENCOUNTER — Encounter: Payer: Self-pay | Admitting: Family Medicine

## 2018-11-04 VITALS — BP 112/62 | HR 81 | Temp 97.8°F | Ht 65.0 in | Wt 143.0 lb

## 2018-11-04 DIAGNOSIS — E785 Hyperlipidemia, unspecified: Secondary | ICD-10-CM

## 2018-11-04 DIAGNOSIS — I712 Thoracic aortic aneurysm, without rupture, unspecified: Secondary | ICD-10-CM

## 2018-11-04 DIAGNOSIS — R1012 Left upper quadrant pain: Secondary | ICD-10-CM

## 2018-11-04 DIAGNOSIS — Z23 Encounter for immunization: Secondary | ICD-10-CM | POA: Diagnosis not present

## 2018-11-04 DIAGNOSIS — M545 Low back pain, unspecified: Secondary | ICD-10-CM

## 2018-11-04 DIAGNOSIS — K219 Gastro-esophageal reflux disease without esophagitis: Secondary | ICD-10-CM

## 2018-11-04 DIAGNOSIS — G8929 Other chronic pain: Secondary | ICD-10-CM

## 2018-11-04 LAB — COMPREHENSIVE METABOLIC PANEL
ALT: 16 U/L (ref 0–35)
AST: 21 U/L (ref 0–37)
Albumin: 4.1 g/dL (ref 3.5–5.2)
Alkaline Phosphatase: 62 U/L (ref 39–117)
BUN: 17 mg/dL (ref 6–23)
CO2: 29 mEq/L (ref 19–32)
Calcium: 9.3 mg/dL (ref 8.4–10.5)
Chloride: 98 mEq/L (ref 96–112)
Creatinine, Ser: 0.62 mg/dL (ref 0.40–1.20)
GFR: 91.95 mL/min (ref >60.00)
Glucose, Bld: 73 mg/dL (ref 70–99)
Potassium: 4.1 mEq/L (ref 3.5–5.1)
Sodium: 133 mEq/L — ABNORMAL LOW (ref 135–145)
Total Bilirubin: 0.5 mg/dL (ref 0.2–1.2)
Total Protein: 6.7 g/dL (ref 6.0–8.3)

## 2018-11-04 LAB — CBC
HEMATOCRIT: 38.9 % (ref 36.0–46.0)
Hemoglobin: 12.9 g/dL (ref 12.0–15.0)
MCHC: 33.1 g/dL (ref 30.0–36.0)
MCV: 86.7 fl (ref 78.0–100.0)
Platelets: 219 10*3/uL (ref 150.0–400.0)
RBC: 4.49 Mil/uL (ref 3.87–5.11)
RDW: 15 % (ref 11.5–15.5)
WBC: 6.6 10*3/uL (ref 4.0–10.5)

## 2018-11-04 LAB — LIPID PANEL
CHOLESTEROL: 170 mg/dL (ref 0–200)
HDL: 69.5 mg/dL (ref >39.00)
LDL Cholesterol: 89 mg/dL (ref 0–99)
NonHDL: 100.21
Total CHOL/HDL Ratio: 2
Triglycerides: 55 mg/dL (ref 0.0–149.0)
VLDL: 11 mg/dL (ref 0.0–40.0)

## 2018-11-04 LAB — LIPASE: Lipase: 32 U/L (ref 11.0–59.0)

## 2018-11-04 MED ORDER — DICLOFENAC SODIUM 1 % TD GEL: 2.0000 g | Freq: Four times a day (QID) | TRANSDERMAL | 3 refills | Status: DC

## 2018-11-04 MED ORDER — IPRATROPIUM BROMIDE 0.06 % NA SOLN: 2.0000 | Freq: Two times a day (BID) | NASAL | 1 refills | Status: DC | PRN

## 2018-11-04 NOTE — Assessment & Plan Note (Signed)
S: also notes pain in LUQ after meals. Mild to moderate and lasts for a few hours. Not everynight A/P: This may represent poorly controlled reflux.  We discussed a 2-week trial of Nexium on a daily basis to see if that helps.  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

## 2018-11-04 NOTE — Patient Instructions (Addendum)
Health Maintenance Due  Topic Date Due  . PNA vac Low Risk Adult (2 of 2 - PCV13)-lets do Pneumovax 23 today-new guidelines do not recommend Prevnar 13 anymore-team you can postpone Prevnar 13 recommendation for 25 years 12/20/2013   Lets trial Voltaren gel for your joints that are bothering you since the gabapentin did not work well  For pain in left upper abdomen-trial of Nexium on a daily basis for 2 weeks to see if that helps.  Can also trial Gas-X to see if that helps.  You are not due for a physical until September 3 or later of 2020  You are correct you have not had labs recently-lets update those today.  I will check on your liver and pancreas as well as cholesterol and kidneys   Please stop by lab before you go If you do not have mychart- we will call you about results within 5 business days of [place] receiving them.  If you have mychart- we will send your results within 3 business days of [place] receiving them.  If abnormal or we want to clarify a result, we will call or mychart you to make sure you receive the message.  If you have questions or concerns or don't hear within 5-7 days, please send [place] a message or call us.

## 2018-11-04 NOTE — Progress Notes (Signed)
Phone [phone]   Subjective:  Cindy Schwartz is a 85 y.o. year old very pleasant female patient who presents for/with See problem oriented charting ROS-left upper quadrant pain noted.  Notes arthralgias in several joints.  No chest pain or shortness of breath reported.  No edema.  Past Medical History-  Patient Active Problem List   Diagnosis Date Noted  . history of Breast cancer of lower-outer quadrant of right female breast  01/16/2016    Priority: High  . Aneurysm of thoracic aorta ([place]) 05/06/2007    Priority: High  . History of ovarian cancer 09/22/1977    Priority: High  . Chronic cough 01/21/2018    Priority: Medium  . Normal coronary arteries 10/17/2014    Priority: Medium  . Esophageal reflux 05/06/2007    Priority: Medium  . Osteoporosis 03/18/2007    Priority: Medium  . Genetic testing 07/09/2017    Priority: Low  . Palpitations 11/13/2014    Priority: Low  . Varicose veins of lower extremities with other complications 03/21/1600    Priority: Low  . Hx of herpes simplex infection 12/18/2011    Priority: Low  . Low back pain 06/28/2010    Priority: Low  . Generalized anxiety disorder 10/13/2008    Priority: Low  . Irritable bowel syndrome 10/13/2008    Priority: Low  . Lung nodule 11/25/2007    Priority: Low  . Raynaud's syndrome 07/01/2007    Priority: Low  . Allergic rhinitis 05/06/2007    Priority: Low  . DEGENERATIVE DISC DISEASE, CERVICAL SPINE 05/06/2007    Priority: Low    Medications- reviewed and updated Current Outpatient Medications  Medication Sig Dispense Refill  . CALCIUM PO Take 1,000 mg by mouth.    Marland Kitchen CALCIUM-MAG-VIT C-VIT D PO Take 15 mLs by mouth daily.    . Cholecalciferol (VITAMIN D3) 1000 units CAPS Take 1,000 Units by mouth daily.    Marland Kitchen esomeprazole (NEXIUM) 40 MG capsule Take 1 capsule (40 mg total) by mouth every other day. 90 capsule 3  . ipratropium (ATROVENT) 0.06 % nasal spray U 2 SPRAYS IEN BID PRF DRAINAGE    .  MAGNESIUM PO Take 300 mg by mouth.    . valACYclovir (VALTREX) 500 MG tablet Take 1 tablet (500 mg total) by mouth daily. 90 tablet 3  . diclofenac sodium (VOLTAREN) 1 % GEL Apply 2 g topically 4 (four) times daily. 100 g 3  . ipratropium (ATROVENT) 0.06 % nasal spray Place 2 sprays into both nostrils 2 (two) times daily as needed for rhinitis. 15 mL 1   No current facility-administered medications for this visit.      Objective:  BP 112/62 (BP Location: Left Arm, Patient Position: Sitting, Cuff Size: Normal)   Pulse 81   Temp 97.8 F (36.6 C) (Oral)   Ht 5\' 5"  (1.651 m)   Wt 143 lb (64.9 kg)   LMP  (LMP Unknown)   SpO2 98%   BMI 23.80 kg/m  Gen: NAD, resting comfortably Oropharynx largely normal CV: RRR no murmurs rubs or gallops Lungs: CTAB no crackles, wheeze, rhonchi Abdomen: soft/nontender-even with deep palpation/nondistended/normal bowel sounds. No rebound or guarding.  Ext: no edema Skin: warm, dry     Assessment and Plan   Low back pain S: patient complains of diffuse arthralgias. Was given gabapentin by Dr. Paulla Fore but it actually keeps her awake so she has stopped it. Had x-ray without fracture or poor alignment of spine. Did note old compression fractures unchanged from prior  CT scan 2018. In December had knee x-ray showing chronic degenerative changes.   Pain in the low back, right knee, left ankle when saw Dr. Paulla Fore. Now with pain in the left knee as well. Has also seen the foot doctor. Due to pain pushes up with hands. No pain in shoulders and neck  Tylenol has not been very helpful  A/P: poor control of pain in low back, knees- even though intermittent she wants other options. Patient wants to d/c gabapentin permanently. Willing to trial topicals (worried about oral med side effects in general).   Esophageal reflux S: also notes pain in LUQ after meals. Mild to moderate and lasts for a few hours. Not everynight A/P: This may represent poorly controlled reflux.   We discussed a 2-week trial of Nexium on a daily basis to see if that helps.  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 Allene Dillon every other year-scheduled in March for this.  Blood pressure goal less than 130/80 -Patient with normal coronary arteries in the past but still follows with Dr. Johnsie Cancel every 6 months- palpitations with PVCs/PACs in the past A/P: Stable problems x2. Continue current medications.   - BP at goal  Other notes: 1.Caregiver burden in caring for her husband with cancer and pulmonary fibrosis- her faith as a [employer] is important to her and she is leaning on this for support  2. For runny nose- likes to have ipratropium on hand- refilled 3. Mild hyperlipidemia- wants to update labs and we will update full labs today. Unlikely to start statin at 84. Luckily labs came back and lipids looked excellent and no need for statin anyway Lab Results  Component Value Date   CHOL 170 11/04/2018   HDL 69.50 11/04/2018   LDLCALC 89 11/04/2018   LDLDIRECT 127.5 12/20/2012   TRIG 55.0 11/04/2018   CHOLHDL 2 11/04/2018     Future Appointments  Date Time Provider [place]  11/15/2018 10:15 AM Lyndee Hensen, PT [facility]  12/02/2018  8:40 AM Gerda Diss, DO [facility]  12/06/2018 11:50 AM [facility] MR 1 [facility] [address] [facility]  12/22/2018  9:30 AM Gaye Pollack, MD [facility]  06/01/2019  8:00 AM Marin Olp, MD [facility]   Lab/Order associations: Hyperlipidemia, unspecified hyperlipidemia type - Plan: CBC, Comprehensive metabolic panel, Lipid panel  LUQ pain - Plan: Lipase  Need for prophylactic vaccination against Streptococcus pneumoniae (pneumococcus) - Plan: Pneumococcal polysaccharide vaccine 23-valent greater than or equal to 2yo subcutaneous/IM  Chronic low back  pain without sciatica, unspecified back pain laterality  Gastroesophageal reflux disease without esophagitis  Meds ordered this encounter  Medications  . ipratropium (ATROVENT) 0.06 % nasal spray    Sig: Place 2 sprays into both nostrils 2 (two) times daily as needed for rhinitis.    Dispense:  15 mL    Refill:  1  . diclofenac sodium (VOLTAREN) 1 % GEL    Sig: Apply 2 g topically 4 (four) times daily.    Dispense:  100 g    Refill:  3    Return precautions advised.  Garret Reddish, MD

## 2018-11-04 NOTE — Assessment & Plan Note (Signed)
S: patient complains of diffuse arthralgias. Was given gabapentin by Dr. Paulla Fore but it actually keeps her awake so she has stopped it. Had x-ray without fracture or poor alignment of spine. Did note old compression fractures unchanged from prior CT scan 2018. In December had knee x-ray showing chronic degenerative changes.   Pain in the low back, right knee, left ankle when saw Dr. Paulla Fore. Now with pain in the left knee as well. Has also seen the foot doctor. Due to pain pushes up with hands. No pain in shoulders and neck  Tylenol has not been very helpful  A/P: poor control of pain in low back, knees- even though intermittent she wants other options. Patient wants to d/c gabapentin permanently. Willing to trial topicals (worried about oral med side effects in general).

## 2018-11-15 ENCOUNTER — Ambulatory Visit (INDEPENDENT_AMBULATORY_CARE_PROVIDER_SITE_OTHER): Payer: Medicare Other | Admitting: Physical Therapy

## 2018-11-15 DIAGNOSIS — G8929 Other chronic pain: Secondary | ICD-10-CM

## 2018-11-15 DIAGNOSIS — M5442 Lumbago with sciatica, left side: Secondary | ICD-10-CM | POA: Diagnosis not present

## 2018-11-15 DIAGNOSIS — M25561 Pain in right knee: Secondary | ICD-10-CM

## 2018-11-16 NOTE — Therapy (Signed)
[facility] [address] [place], [place], [address] Phone: [phone]   Fax:  [phone]  Physical Therapy Evaluation  Patient Details  Name: Cindy Schwartz MRN: [id number] Date of Birth: 1934/01/25 Referring Provider (PT): Teresa Coombs   Encounter Date: 11/15/2018  PT End of Session - 11/15/18 1404    Visit Number  1    Number of Visits  12    Date for PT Re-Evaluation  12/27/18    Authorization Type  [employer] medicare    PT Start Time  3267    PT Stop Time  1102    PT Time Calculation (min)  44 min    Activity Tolerance  Patient tolerated treatment well    Behavior During Therapy  [facility] for tasks assessed/performed       Past Medical History:  Diagnosis Date  . Aneurysm of ascending aorta ([place])    a. 4.2-4.3cm in 2014.  Marland Kitchen Anxiety   . Breast cancer ([facility]) 2018   right mastectomy  . Cancer ([place]) 1980   ovarian cancer  . Compression fracture of L1 lumbar vertebra ([facility])   . COMPRESSION FRACTURE, THORACIC VERTEBRA 07/24/2010   Qualifier: Diagnosis of  By: [name], [name]   . Elevated BP Transient 09/2014  . Genetic testing 07/09/2017   Multi-Cancer panel (83 genes) @ [facility] - No pathogenic mutations detected  . GERD (gastroesophageal reflux disease)   . H/O bone density study    2015  . H/O colonoscopy   . History of ovarian cancer    November 1979  . 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 Insomnia   . Normal coronary arteries Cath - 09/2014  . Osteoporosis   . Raynaud's disease     Past Surgical History:  Procedure Laterality Date  . ABDOMINAL HYSTERECTOMY    . APPENDECTOMY    . BILATERAL SALPINGOOPHORECTOMY    . BREAST LUMPECTOMY    . LEFT HEART CATHETERIZATION WITH CORONARY ANGIOGRAM N/A 10/17/2014   Procedure: LEFT HEART CATHETERIZATION WITH CORONARY ANGIOGRAM;  Surgeon: [name], MD;  Location: [facility] CATH LAB;  Service: Cardiovascular;  Laterality: N/A;   . ovarian ca s/p taj/bso  1979  . SIMPLE MASTECTOMY WITH AXILLARY SENTINEL NODE BIOPSY Right 05/05/2017   Procedure: RIGHT TOTAL MASTECTOMY WITH RIGHT  AXILLARY SENTINEL NODE BIOPSY;  Surgeon: [name], MD;  Location: [facility];  Service: General;  Laterality: Right;  PECTORAL BLOCK    There were no vitals filed for this visit.   Subjective Assessment - 11/15/18 1023    Subjective  Pt states increased pain in low back, and in L lateral calf.  She states mild discomfort in R lower leg as well, but L >R.  She also has new onset of L arch pain recently. Back pain worse in am, standing, walking, sitting in bad posture, bending and working at home. She is retired.     Limitations  Sitting;Lifting;Standing;Walking;House hold activities    Currently in Pain?  Yes    Pain Score  5     Pain Location  Back    Pain Orientation  Right;Left;Lower    Pain Descriptors / Indicators  Aching    Pain Type  Acute pain    Pain Onset  More than a month ago    Pain Frequency  Intermittent    Aggravating Factors   Standing, bending, IADLS , sitting     Multiple Pain Sites  Yes  Pain Score  6    Pain Location  Leg    Pain Orientation  Left;Lower    Pain Descriptors / Indicators  Sore    Pain Type  Acute pain    Pain Onset  1 to 4 weeks ago    Pain Frequency  Intermittent    Aggravating Factors   night time, standing,          [facility] PT Assessment - 11/16/18 0001      Assessment   Medical Diagnosis  Back pain, L LE pain, R knee pain    Referring Provider (PT)  Teresa Coombs    Hand Dominance  Right    Prior Therapy  no      Precautions   Precautions  None      Balance Screen   Has the patient fallen in the past 6 months  No      Prior Function   Level of Independence  Independent      Cognition   Overall Cognitive Status  Within Functional Limits for tasks assessed      Posture/Postural Control   Posture Comments  Poor seated posture, kyphotic,  Standing: WFL.       AROM    Overall AROM Comments  Hip: mild limitation:  Knee: mild/minimal limitation for R knee flexion;  Lumbar: mild limitation for extension.      Strength   Overall Strength Comments  Hip: 4-/5;  core: 3/5;  Knee: 4/5       Palpation   Palpation comment  Pain at lateral gastroc,  Tightness and limitation with bil HS and bil Gastroc;  Pain at L PTT tendon;  moderate/significant stiffness in thoracic spine, moderate stiffness and limitation in lumbar spine;  Tenderness in Bil Glute/piriforims;       Special Tests   Other special tests  Neg SLR,                 Objective measurements completed on examination: See above findings.      [facility] Adult PT Treatment/Exercise - 11/16/18 0001      Exercises   Exercises  Lumbar      Lumbar Exercises: Stretches   Gastroc Stretch  3 reps;30 seconds    Gastroc Stretch Limitations  at wall              PT Education - 11/15/18 1103    Education Details  PT POC.     Person(s) Educated  Patient    Methods  Explanation    Comprehension  Verbalized understanding;Need further instruction       PT Short Term Goals - 11/15/18 1406      PT SHORT TERM GOAL #1   Title  be independent in initial HEP    Time  2    Period  Weeks    Status  New    Target Date  11/29/18      PT SHORT TERM GOAL #2   Title  Pt to demo decreased pain in low back, to 0-3/10 with activit     Time  2    Period  Weeks    Status  New    Target Date  11/29/18        PT Long Term Goals - 11/15/18 1407      PT LONG TERM GOAL #1   Title  be independent in advanced /final HEP for low back and R knee .    Time  6    Period  Weeks    Status  New    Target Date  12/27/18      PT LONG TERM GOAL #2   Title  Pt to report decreased pain in low back and L lateral thigh, to 0-2/10 with activity     Time  6    Period  Weeks    Status  New    Target Date  12/27/18      PT LONG TERM GOAL #3   Title  Pt to demo proper seated posture, for optimal alignement and  decreased pain     Time  6    Period  Weeks    Status  New    Target Date  12/27/18      PT LONG TERM GOAL #4   Title  Pt to demo increased LE strength to at least 4+/5 to improve stability, pain, and ambulaiton.              Plan - 11/15/18 1409    Clinical Impression Statement  Pt with primary complaint of increased pain in bil low back region, and into L lateral leg/calf. She also has pain in R knee, likely from OA, and pain in L foot, likely from PTT tendonitis. She has mild decrease in lumbar and hip ROM. She has decreased strength in hips, core, and quad, and will benefit from education on HEP. She has ankle stiffness bilaterally, with limited DF. Pain in L lower leg likely radicular in nature from lumbar spine. Pt with decreased ability for full functional activities, due to pain, with decreased ability for ADLS, IADLS, and increased standing, walking. Pt to benefit from skilled PT to improve deficits and pain .    Clinical Presentation  Evolving    Clinical Decision Making  Moderate    Rehab Potential  Good    PT Frequency  2x / week    PT Duration  6 weeks    PT Treatment/Interventions  ADLs/Self Care Home Management;Cryotherapy;Electrical Stimulation;Iontophoresis 4mg /ml Dexamethasone;Functional mobility training;Stair training;Gait training;Ultrasound;Moist Heat;Traction;Therapeutic activities;Therapeutic exercise;Balance training;Neuromuscular re-education;Patient/family education;Passive range of motion;Orthotic Fit/Training;Manual techniques;Dry needling;Taping;Spinal Manipulations;Joint Manipulations    Consulted and Agree with Plan of Care  Patient       Patient will benefit from skilled therapeutic intervention in order to improve the following deficits and impairments:  Abnormal gait, Pain, Increased muscle spasms, Decreased mobility, Decreased activity tolerance, Decreased endurance, Decreased range of motion, Decreased strength, Hypomobility, Difficulty walking,  Impaired flexibility  Visit Diagnosis: Chronic bilateral low back pain with left-sided sciatica  Chronic pain of right knee     Problem List Patient Active Problem List   Diagnosis Date Noted  . Chronic cough 01/21/2018  . Genetic testing 07/09/2017  . history of Breast cancer of lower-outer quadrant of right female breast  01/16/2016  . Palpitations 11/13/2014  . Normal coronary arteries 10/17/2014  . Varicose veins of lower extremities with other complications 94/17/4081  . Hx of herpes simplex infection 12/18/2011  . Low back pain 06/28/2010  . Generalized anxiety disorder 10/13/2008  . Irritable bowel syndrome 10/13/2008  . Lung nodule 11/25/2007  . Raynaud's syndrome 07/01/2007  . Aneurysm of thoracic aorta ([place]) 05/06/2007  . Allergic rhinitis 05/06/2007  . Esophageal reflux 05/06/2007  . DEGENERATIVE DISC DISEASE, CERVICAL SPINE 05/06/2007  . Osteoporosis 03/18/2007  . History of ovarian cancer 09/22/1977    Lyndee Hensen, PT, DPT 7:59 AM  11/16/18    [facility] [place], [place], [address] Phone:  [phone]   Fax:  [phone]  Name: Cindy Schwartz MRN: [id number] Date of Birth: 11-26-1933

## 2018-11-18 ENCOUNTER — Ambulatory Visit: Payer: Medicare Other | Admitting: Sports Medicine

## 2018-11-22 ENCOUNTER — Ambulatory Visit (INDEPENDENT_AMBULATORY_CARE_PROVIDER_SITE_OTHER): Payer: Medicare Other | Admitting: Physical Therapy

## 2018-11-22 DIAGNOSIS — G8929 Other chronic pain: Secondary | ICD-10-CM | POA: Diagnosis not present

## 2018-11-22 DIAGNOSIS — M5442 Lumbago with sciatica, left side: Secondary | ICD-10-CM

## 2018-11-22 DIAGNOSIS — M25561 Pain in right knee: Secondary | ICD-10-CM | POA: Diagnosis not present

## 2018-11-24 ENCOUNTER — Ambulatory Visit (INDEPENDENT_AMBULATORY_CARE_PROVIDER_SITE_OTHER): Payer: Medicare Other | Admitting: Physical Therapy

## 2018-11-24 ENCOUNTER — Encounter: Payer: Self-pay | Admitting: Physical Therapy

## 2018-11-24 DIAGNOSIS — M5442 Lumbago with sciatica, left side: Secondary | ICD-10-CM | POA: Diagnosis not present

## 2018-11-24 DIAGNOSIS — G8929 Other chronic pain: Secondary | ICD-10-CM

## 2018-11-24 DIAGNOSIS — M25561 Pain in right knee: Secondary | ICD-10-CM

## 2018-11-24 NOTE — Therapy (Signed)
[facility] [address] [place], [place], [address] Phone: [phone]   Fax:  [phone]  Physical Therapy Treatment  Patient Details  Name: Cindy Schwartz MRN: [id number] Date of Birth: 25-Apr-1934 Referring Provider (PT): Teresa Coombs   Encounter Date: 11/24/2018  PT End of Session - 11/24/18 1031    Visit Number  3    Number of Visits  12    Date for PT Re-Evaluation  12/27/18    Authorization Type  [employer] medicare    PT Start Time  0845    PT Stop Time  0929    PT Time Calculation (min)  44 min    Activity Tolerance  Patient tolerated treatment well    Behavior During Therapy  [facility] for tasks assessed/performed       Past Medical History:  Diagnosis Date  . Aneurysm of ascending aorta ([place])    a. 4.2-4.3cm in 2014.  Marland Kitchen Anxiety   . Breast cancer ([place]) 2018   right mastectomy  . Cancer ([place]) 1980   ovarian cancer  . Compression fracture of L1 lumbar vertebra ([facility])   . COMPRESSION FRACTURE, THORACIC VERTEBRA 07/24/2010   Qualifier: Diagnosis of  By: [name], [name]   . Elevated BP Transient 09/2014  . Genetic testing 07/09/2017   Multi-Cancer panel (83 genes) @ [facility] - No pathogenic mutations detected  . GERD (gastroesophageal reflux disease)   . H/O bone density study    2015  . H/O colonoscopy   . History of ovarian cancer    November 1979  . 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 Insomnia   . Normal coronary arteries Cath - 09/2014  . Osteoporosis   . Raynaud's disease     Past Surgical History:  Procedure Laterality Date  . ABDOMINAL HYSTERECTOMY    . APPENDECTOMY    . BILATERAL SALPINGOOPHORECTOMY    . BREAST LUMPECTOMY    . LEFT HEART CATHETERIZATION WITH CORONARY ANGIOGRAM N/A 10/17/2014   Procedure: LEFT HEART CATHETERIZATION WITH CORONARY ANGIOGRAM;  Surgeon: [name], MD;  Location: [facility] CATH LAB;  Service: Cardiovascular;  Laterality: N/A;   . ovarian ca s/p taj/bso  1979  . SIMPLE MASTECTOMY WITH AXILLARY SENTINEL NODE BIOPSY Right 05/05/2017   Procedure: RIGHT TOTAL MASTECTOMY WITH RIGHT  AXILLARY SENTINEL NODE BIOPSY;  Surgeon: [name], MD;  Location: [facility];  Service: General;  Laterality: Right;  PECTORAL BLOCK    There were no vitals filed for this visit.  Subjective Assessment - 11/24/18 1030    Subjective  Pt with many questions on multiple pain locations today.     Currently in Pain?  Yes    Pain Score  5     Pain Location  Leg    Pain Orientation  Left    Pain Descriptors / Indicators  Aching    Pain Type  Acute pain    Pain Onset  More than a month ago    Pain Frequency  Intermittent                       [facility] Adult PT Treatment/Exercise - 11/24/18 0903      Exercises   Exercises  Lumbar      Lumbar Exercises: Stretches   Active Hamstring Stretch  3 reps;30 seconds    Active Hamstring Stretch Limitations  seated    Single Knee to Chest Stretch  3 reps;30 seconds  ITB Stretch  3 reps;30 seconds    ITB Stretch Limitations  with strap/supine    Gastroc Stretch  --    Gastroc Stretch Limitations  --    Other Lumbar Stretch Exercise  --      Lumbar Exercises: Aerobic   Stationary Bike  L1 x 6 min      Lumbar Exercises: Standing   Other Standing Lumbar Exercises  --      Lumbar Exercises: Seated   Long Arc Quad on Chair  10 reps;2 sets;Both      Lumbar Exercises: Supine   Pelvic Tilt  20 reps    Bent Knee Raise  20 reps    Bent Knee Raise Limitations  with TA      Manual Therapy   Manual Therapy  Soft tissue mobilization;Joint mobilization;Passive ROM;Manual Traction    Joint Mobilization  For L ankle, to increase DF.     Passive ROM  PROM for L ITB stretch and nerve glide; Manual gastroc stretch on L;     Manual Traction  Long leg distraction on L 10 sec x8;                PT Short Term Goals - 11/15/18 1406      PT SHORT TERM GOAL #1   Title  be  independent in initial HEP    Time  2    Period  Weeks    Status  New    Target Date  11/29/18      PT SHORT TERM GOAL #2   Title  Pt to demo decreased pain in low back, to 0-3/10 with activit     Time  2    Period  Weeks    Status  New    Target Date  11/29/18        PT Long Term Goals - 11/15/18 1407      PT LONG TERM GOAL #1   Title  be independent in advanced /final HEP for low back and R knee .    Time  6    Period  Weeks    Status  New    Target Date  12/27/18      PT LONG TERM GOAL #2   Title  Pt to report decreased pain in low back and L lateral thigh, to 0-2/10 with activity     Time  6    Period  Weeks    Status  New    Target Date  12/27/18      PT LONG TERM GOAL #3   Title  Pt to demo proper seated posture, for optimal alignement and decreased pain     Time  6    Period  Weeks    Status  New    Target Date  12/27/18      PT LONG TERM GOAL #4   Title  Pt to demo increased LE strength to at least 4+/5 to improve stability, pain, and ambulaiton.             Plan - 11/24/18 1032    Clinical Impression Statement  Pt given footwear recommendation for increased support of L great toe and arch for decreasing pain in L foot. Ther ex and manual continued for pain in back and in L LE. Pt with minimal pain in LE today, states it gets worse as day goes on. Pain/OA in R knee, and precautions for this also discussed today. Reinforced HEP , will progress as  able.     Rehab Potential  Good    PT Frequency  2x / week    PT Duration  6 weeks    PT Treatment/Interventions  ADLs/Self Care Home Management;Cryotherapy;Electrical Stimulation;Iontophoresis 4mg /ml Dexamethasone;Functional mobility training;Stair training;Gait training;Ultrasound;Moist Heat;Traction;Therapeutic activities;Therapeutic exercise;Balance training;Neuromuscular re-education;Patient/family education;Passive range of motion;Orthotic Fit/Training;Manual techniques;Dry needling;Taping;Spinal  Manipulations;Joint Manipulations    Consulted and Agree with Plan of Care  Patient       Patient will benefit from skilled therapeutic intervention in order to improve the following deficits and impairments:  Abnormal gait, Pain, Increased muscle spasms, Decreased mobility, Decreased activity tolerance, Decreased endurance, Decreased range of motion, Decreased strength, Hypomobility, Difficulty walking, Impaired flexibility  Visit Diagnosis: Chronic bilateral low back pain with left-sided sciatica  Chronic pain of right knee     Problem List Patient Active Problem List   Diagnosis Date Noted  . Chronic cough 01/21/2018  . Genetic testing 07/09/2017  . history of Breast cancer of lower-outer quadrant of right female breast  01/16/2016  . Palpitations 11/13/2014  . Normal coronary arteries 10/17/2014  . Varicose veins of lower extremities with other complications 42/68/3419  . Hx of herpes simplex infection 12/18/2011  . Low back pain 06/28/2010  . Generalized anxiety disorder 10/13/2008  . Irritable bowel syndrome 10/13/2008  . Lung nodule 11/25/2007  . Raynaud's syndrome 07/01/2007  . Aneurysm of thoracic aorta ([place]) 05/06/2007  . Allergic rhinitis 05/06/2007  . Esophageal reflux 05/06/2007  . DEGENERATIVE DISC DISEASE, CERVICAL SPINE 05/06/2007  . Osteoporosis 03/18/2007  . History of ovarian cancer 09/22/1977   Lyndee Hensen, PT, DPT 10:33 AM  11/24/18    [facility] [place], [place], [address] Phone: [phone]   Fax:  [phone]  Name: Makynlie Rossini MRN: [id number] Date of Birth: 11/07/1933

## 2018-11-24 NOTE — Therapy (Signed)
[facility] [address] [place], [place], [address] Phone: [phone]   Fax:  [phone]  Physical Therapy Treatment  Patient Details  Name: Cindy Schwartz MRN: [id number] Date of Birth: 23-Jul-1934 Referring Provider (PT): Teresa Coombs   Encounter Date: 11/22/2018  PT End of Session - 11/24/18 0842    Visit Number  2    Number of Visits  12    Date for PT Re-Evaluation  12/27/18    Authorization Type  [employer] medicare    PT Start Time  1430    PT Stop Time  1513    PT Time Calculation (min)  43 min    Activity Tolerance  Patient tolerated treatment well    Behavior During Therapy  [facility] for tasks assessed/performed       Past Medical History:  Diagnosis Date  . Aneurysm of ascending aorta ([place])    a. 4.2-4.3cm in 2014.  Marland Kitchen Anxiety   . Breast cancer ([place]) 2018   right mastectomy  . Cancer ([place]) 1980   ovarian cancer  . Compression fracture of L1 lumbar vertebra ([facility])   . COMPRESSION FRACTURE, THORACIC VERTEBRA 07/24/2010   Qualifier: Diagnosis of  By: [name], [name]   . Elevated BP Transient 09/2014  . Genetic testing 07/09/2017   Multi-Cancer panel (83 genes) @ [facility] - No pathogenic mutations detected  . GERD (gastroesophageal reflux disease)   . H/O bone density study    2015  . H/O colonoscopy   . History of ovarian cancer    November 1979  . 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 Insomnia   . Normal coronary arteries Cath - 09/2014  . Osteoporosis   . Raynaud's disease     Past Surgical History:  Procedure Laterality Date  . ABDOMINAL HYSTERECTOMY    . APPENDECTOMY    . BILATERAL SALPINGOOPHORECTOMY    . BREAST LUMPECTOMY    . LEFT HEART CATHETERIZATION WITH CORONARY ANGIOGRAM N/A 10/17/2014   Procedure: LEFT HEART CATHETERIZATION WITH CORONARY ANGIOGRAM;  Surgeon: [name], MD;  Location: [facility] CATH LAB;  Service: Cardiovascular;  Laterality: N/A;   . ovarian ca s/p taj/bso  1979  . SIMPLE MASTECTOMY WITH AXILLARY SENTINEL NODE BIOPSY Right 05/05/2017   Procedure: RIGHT TOTAL MASTECTOMY WITH RIGHT  AXILLARY SENTINEL NODE BIOPSY;  Surgeon: [name], MD;  Location: [facility];  Service: General;  Laterality: Right;  PECTORAL BLOCK    There were no vitals filed for this visit.  Subjective Assessment - 11/24/18 0842    Subjective  Pt states pain in l lateral leg is most bothersome ,and L foot pain.     Currently in Pain?  Yes    Pain Score  5     Pain Location  Leg    Pain Orientation  Left    Pain Descriptors / Indicators  Aching    Pain Type  Acute pain    Pain Onset  More than a month ago    Pain Frequency  Intermittent                       [facility] Adult PT Treatment/Exercise - 11/24/18 0001      Exercises   Exercises  Lumbar      Lumbar Exercises: Stretches   Active Hamstring Stretch  3 reps;30 seconds    Active Hamstring Stretch Limitations  seated    Single Knee to Chest  Stretch  3 reps;30 seconds    ITB Stretch  3 reps;30 seconds    ITB Stretch Limitations  with strap/supine    Gastroc Stretch  3 reps;30 seconds    Gastroc Stretch Limitations  Longsitting with strap     Other Lumbar Stretch Exercise  Standing QL stretch 30 sec x2 bil;       Lumbar Exercises: Aerobic   Stationary Bike  L1 x 6 min      Lumbar Exercises: Standing   Other Standing Lumbar Exercises   HIp abd 2x10 bil;       Lumbar Exercises: Supine   Pelvic Tilt  20 reps      Manual Therapy   Manual Therapy  Soft tissue mobilization;Joint mobilization;Passive ROM;Manual Traction    Passive ROM  PROM for L ITB stretch and nerve glide    Manual Traction  Long leg distraction on L 10 sec x8;              PT Education - 11/24/18 0844    Education Details  Initial HEP reviewed    Person(s) Educated  Patient    Methods  Explanation;Handout;Demonstration;Tactile cues    Comprehension  Verbalized understanding;Need further  instruction       PT Short Term Goals - 11/15/18 1406      PT SHORT TERM GOAL #1   Title  be independent in initial HEP    Time  2    Period  Weeks    Status  New    Target Date  11/29/18      PT SHORT TERM GOAL #2   Title  Pt to demo decreased pain in low back, to 0-3/10 with activit     Time  2    Period  Weeks    Status  New    Target Date  11/29/18        PT Long Term Goals - 11/15/18 1407      PT LONG TERM GOAL #1   Title  be independent in advanced /final HEP for low back and R knee .    Time  6    Period  Weeks    Status  New    Target Date  12/27/18      PT LONG TERM GOAL #2   Title  Pt to report decreased pain in low back and L lateral thigh, to 0-2/10 with activity     Time  6    Period  Weeks    Status  New    Target Date  12/27/18      PT LONG TERM GOAL #3   Title  Pt to demo proper seated posture, for optimal alignement and decreased pain     Time  6    Period  Weeks    Status  New    Target Date  12/27/18      PT LONG TERM GOAL #4   Title  Pt to demo increased LE strength to at least 4+/5 to improve stability, pain, and ambulaiton.             Plan - 11/24/18 0843    Clinical Impression Statement  Reviewed HEP for back and LE stretching today. Pt requires max cueing for all exercises. Pt with multiple pain locations, back, L LE, L foot and R knee, that are bothersome during trials for ther ex. Pt with no increased pain during activities today in back or L LE. Plan to progress as tolerated.  Rehab Potential  Good    PT Frequency  2x / week    PT Duration  6 weeks    PT Treatment/Interventions  ADLs/Self Care Home Management;Cryotherapy;Electrical Stimulation;Iontophoresis 4mg /ml Dexamethasone;Functional mobility training;Stair training;Gait training;Ultrasound;Moist Heat;Traction;Therapeutic activities;Therapeutic exercise;Balance training;Neuromuscular re-education;Patient/family education;Passive range of motion;Orthotic  Fit/Training;Manual techniques;Dry needling;Taping;Spinal Manipulations;Joint Manipulations    Consulted and Agree with Plan of Care  Patient       Patient will benefit from skilled therapeutic intervention in order to improve the following deficits and impairments:  Abnormal gait, Pain, Increased muscle spasms, Decreased mobility, Decreased activity tolerance, Decreased endurance, Decreased range of motion, Decreased strength, Hypomobility, Difficulty walking, Impaired flexibility  Visit Diagnosis: Chronic bilateral low back pain with left-sided sciatica  Chronic pain of right knee     Problem List Patient Active Problem List   Diagnosis Date Noted  . Chronic cough 01/21/2018  . Genetic testing 07/09/2017  . history of Breast cancer of lower-outer quadrant of right female breast  01/16/2016  . Palpitations 11/13/2014  . Normal coronary arteries 10/17/2014  . Varicose veins of lower extremities with other complications 36/46/8032  . Hx of herpes simplex infection 12/18/2011  . Low back pain 06/28/2010  . Generalized anxiety disorder 10/13/2008  . Irritable bowel syndrome 10/13/2008  . Lung nodule 11/25/2007  . Raynaud's syndrome 07/01/2007  . Aneurysm of thoracic aorta ([place]) 05/06/2007  . Allergic rhinitis 05/06/2007  . Esophageal reflux 05/06/2007  . DEGENERATIVE DISC DISEASE, CERVICAL SPINE 05/06/2007  . Osteoporosis 03/18/2007  . History of ovarian cancer 09/22/1977    Lyndee Hensen, PT, DPT 8:46 AM  11/24/18    [facility] [place], [place], [address] Phone: [phone]   Fax:  [phone]  Name: Cindy Schwartz MRN: [id number] Date of Birth: 03-05-1934

## 2018-11-25 ENCOUNTER — Ambulatory Visit: Payer: Medicare Other | Admitting: Family Medicine

## 2018-11-26 ENCOUNTER — Ambulatory Visit: Payer: Medicare Other | Admitting: Family Medicine

## 2018-11-29 ENCOUNTER — Ambulatory Visit (INDEPENDENT_AMBULATORY_CARE_PROVIDER_SITE_OTHER): Payer: Medicare Other | Admitting: Physical Therapy

## 2018-11-29 ENCOUNTER — Encounter: Payer: Medicare Other | Admitting: Physical Therapy

## 2018-11-29 ENCOUNTER — Encounter: Payer: Self-pay | Admitting: Physical Therapy

## 2018-11-29 DIAGNOSIS — M5442 Lumbago with sciatica, left side: Secondary | ICD-10-CM

## 2018-11-29 DIAGNOSIS — G8929 Other chronic pain: Secondary | ICD-10-CM

## 2018-11-29 DIAGNOSIS — M25561 Pain in right knee: Secondary | ICD-10-CM

## 2018-11-29 NOTE — Therapy (Signed)
[facility] [address] [place], [place], [address] Phone: [phone]   Fax:  [phone]  Physical Therapy Treatment  Patient Details  Name: Cindy Schwartz MRN: [id number] Date of Birth: Mar 30, 1934 Referring Provider (PT): Teresa Coombs   Encounter Date: 11/29/2018  PT End of Session - 11/29/18 1113    Visit Number  4    Number of Visits  12    Date for PT Re-Evaluation  12/27/18    Authorization Type  [employer] medicare    PT Start Time  1053    PT Stop Time  1135    PT Time Calculation (min)  42 min    Activity Tolerance  Patient tolerated treatment well    Behavior During Therapy  [facility] for tasks assessed/performed       Past Medical History:  Diagnosis Date  . Aneurysm of ascending aorta ([place])    a. 4.2-4.3cm in 2014.  Marland Kitchen Anxiety   . Breast cancer ([place]) 2018   right mastectomy  . Cancer ([place]) 1980   ovarian cancer  . Compression fracture of L1 lumbar vertebra ([facility])   . COMPRESSION FRACTURE, THORACIC VERTEBRA 07/24/2010   Qualifier: Diagnosis of  By: [name], [name]   . Elevated BP Transient 09/2014  . Genetic testing 07/09/2017   Multi-Cancer panel (83 genes) @ [facility] - No pathogenic mutations detected  . GERD (gastroesophageal reflux disease)   . H/O bone density study    2015  . H/O colonoscopy   . History of ovarian cancer    November 1979  . 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 Insomnia   . Normal coronary arteries Cath - 09/2014  . Osteoporosis   . Raynaud's disease     Past Surgical History:  Procedure Laterality Date  . ABDOMINAL HYSTERECTOMY    . APPENDECTOMY    . BILATERAL SALPINGOOPHORECTOMY    . BREAST LUMPECTOMY    . LEFT HEART CATHETERIZATION WITH CORONARY ANGIOGRAM N/A 10/17/2014   Procedure: LEFT HEART CATHETERIZATION WITH CORONARY ANGIOGRAM;  Surgeon: [name], MD;  Location: [facility] CATH LAB;  Service: Cardiovascular;  Laterality: N/A;   . ovarian ca s/p taj/bso  1979  . SIMPLE MASTECTOMY WITH AXILLARY SENTINEL NODE BIOPSY Right 05/05/2017   Procedure: RIGHT TOTAL MASTECTOMY WITH RIGHT  AXILLARY SENTINEL NODE BIOPSY;  Surgeon: [name], MD;  Location: [facility];  Service: General;  Laterality: Right;  PECTORAL BLOCK    There were no vitals filed for this visit.  Subjective Assessment - 11/29/18 1111    Subjective  Pt states pain in L foot . Pain in L lateral leg she feels at night and at end of day.     Currently in Pain?  Yes    Pain Score  2     Pain Location  Leg    Pain Orientation  Left    Pain Descriptors / Indicators  Aching    Pain Type  Acute pain    Pain Onset  More than a month ago    Pain Frequency  Intermittent    Pain Score  5    Pain Location  Knee    Pain Orientation  Right    Pain Descriptors / Indicators  Aching    Pain Type  Acute pain    Pain Onset  More than a month ago    Pain Frequency  Intermittent  [facility] Adult PT Treatment/Exercise - 11/29/18 1051      Exercises   Exercises  Lumbar      Lumbar Exercises: Stretches   Active Hamstring Stretch  3 reps;30 seconds    Active Hamstring Stretch Limitations  seated    Single Knee to Chest Stretch  --    ITB Stretch  --    ITB Stretch Limitations  --      Lumbar Exercises: Aerobic   Stationary Bike  L1 x8 min      Lumbar Exercises: Seated   Long Arc Quad on Chair  10 reps;2 sets;Both      Lumbar Exercises: Supine   Pelvic Tilt  20 reps    Bent Knee Raise  20 reps    Bent Knee Raise Limitations  with TA    Other Supine Lumbar Exercises  Heel slides R knee x15; QS R knee x15;       Lumbar Exercises: Sidelying   Hip Abduction  20 reps      Manual Therapy   Manual Therapy  Soft tissue mobilization;Joint mobilization;Passive ROM;Manual Traction    Joint Mobilization  L lumbar Sidelying mobs    Passive ROM  --    Manual Traction  Long leg distraction on L (for lumbar pump)                PT Short Term Goals - 11/15/18 1406      PT SHORT TERM GOAL #1   Title  be independent in initial HEP    Time  2    Period  Weeks    Status  New    Target Date  11/29/18      PT SHORT TERM GOAL #2   Title  Pt to demo decreased pain in low back, to 0-3/10 with activit     Time  2    Period  Weeks    Status  New    Target Date  11/29/18        PT Long Term Goals - 11/15/18 1407      PT LONG TERM GOAL #1   Title  be independent in advanced /final HEP for low back and R knee .    Time  6    Period  Weeks    Status  New    Target Date  12/27/18      PT LONG TERM GOAL #2   Title  Pt to report decreased pain in low back and L lateral thigh, to 0-2/10 with activity     Time  6    Period  Weeks    Status  New    Target Date  12/27/18      PT LONG TERM GOAL #3   Title  Pt to demo proper seated posture, for optimal alignement and decreased pain     Time  6    Period  Weeks    Status  New    Target Date  12/27/18      PT LONG TERM GOAL #4   Title  Pt to demo increased LE strength to at least 4+/5 to improve stability, pain, and ambulaiton.             Plan - 11/29/18 1137    Clinical Impression Statement  Pt with multiple pain locations. Main area of focus is back and L LE pain. Distraction and lumbar mobilization done today to improve. Pt educated in other ther ex for HEP for R knee mobility  and strength. Pt with improving lumbar pain with motion. Plan to increase strength in functional positions as able.     Rehab Potential  Good    PT Frequency  2x / week    PT Duration  6 weeks    PT Treatment/Interventions  ADLs/Self Care Home Management;Cryotherapy;Electrical Stimulation;Iontophoresis 4mg /ml Dexamethasone;Functional mobility training;Stair training;Gait training;Ultrasound;Moist Heat;Traction;Therapeutic activities;Therapeutic exercise;Balance training;Neuromuscular re-education;Patient/family education;Passive range of motion;Orthotic  Fit/Training;Manual techniques;Dry needling;Taping;Spinal Manipulations;Joint Manipulations    Consulted and Agree with Plan of Care  Patient       Patient will benefit from skilled therapeutic intervention in order to improve the following deficits and impairments:  Abnormal gait, Pain, Increased muscle spasms, Decreased mobility, Decreased activity tolerance, Decreased endurance, Decreased range of motion, Decreased strength, Hypomobility, Difficulty walking, Impaired flexibility  Visit Diagnosis: Chronic bilateral low back pain with left-sided sciatica  Chronic pain of right knee     Problem List Patient Active Problem List   Diagnosis Date Noted  . Chronic cough 01/21/2018  . Genetic testing 07/09/2017  . history of Breast cancer of lower-outer quadrant of right female breast  01/16/2016  . Palpitations 11/13/2014  . Normal coronary arteries 10/17/2014  . Varicose veins of lower extremities with other complications 32/20/2542  . Hx of herpes simplex infection 12/18/2011  . Low back pain 06/28/2010  . Generalized anxiety disorder 10/13/2008  . Irritable bowel syndrome 10/13/2008  . Lung nodule 11/25/2007  . Raynaud's syndrome 07/01/2007  . Aneurysm of thoracic aorta ([place]) 05/06/2007  . Allergic rhinitis 05/06/2007  . Esophageal reflux 05/06/2007  . DEGENERATIVE DISC DISEASE, CERVICAL SPINE 05/06/2007  . Osteoporosis 03/18/2007  . History of ovarian cancer 09/22/1977    Lyndee Hensen, PT, DPT 11:38 AM  11/29/18    [facility], [place], [address] Phone: [phone]   Fax:  [phone]  Name: Jennett Tarbell MRN: [id number] Date of Birth: 05-Jun-1934

## 2018-12-02 ENCOUNTER — Ambulatory Visit: Payer: Medicare Other | Admitting: Sports Medicine

## 2018-12-02 ENCOUNTER — Other Ambulatory Visit: Payer: Self-pay

## 2018-12-02 ENCOUNTER — Encounter: Payer: Self-pay | Admitting: Physical Therapy

## 2018-12-02 ENCOUNTER — Ambulatory Visit (INDEPENDENT_AMBULATORY_CARE_PROVIDER_SITE_OTHER): Payer: Medicare Other | Admitting: Physical Therapy

## 2018-12-02 ENCOUNTER — Other Ambulatory Visit: Payer: Self-pay | Admitting: Sports Medicine

## 2018-12-02 ENCOUNTER — Ambulatory Visit (INDEPENDENT_AMBULATORY_CARE_PROVIDER_SITE_OTHER): Payer: Medicare Other | Admitting: Sports Medicine

## 2018-12-02 VITALS — BP 110/66 | HR 77 | Wt 143.6 lb

## 2018-12-02 DIAGNOSIS — M25561 Pain in right knee: Secondary | ICD-10-CM

## 2018-12-02 DIAGNOSIS — M19072 Primary osteoarthritis, left ankle and foot: Secondary | ICD-10-CM

## 2018-12-02 DIAGNOSIS — G8929 Other chronic pain: Secondary | ICD-10-CM | POA: Diagnosis not present

## 2018-12-02 DIAGNOSIS — M5442 Lumbago with sciatica, left side: Secondary | ICD-10-CM

## 2018-12-02 DIAGNOSIS — M170 Bilateral primary osteoarthritis of knee: Secondary | ICD-10-CM | POA: Diagnosis not present

## 2018-12-02 DIAGNOSIS — M25562 Pain in left knee: Secondary | ICD-10-CM

## 2018-12-02 MED ORDER — CELECOXIB 100 MG PO CAPS: 100.0000 mg | ORAL_CAPSULE | Freq: Two times a day (BID) | ORAL | 1 refills | Status: DC

## 2018-12-02 NOTE — Therapy (Addendum)
[facility] [address] [place], [place], [address] Phone: [phone]   Fax:  [phone]  Physical Therapy Treatment/Discharge Summary  Patient Details  Name: Cindy Schwartz MRN: [id number] Date of Birth: 1933-09-24 Referring Provider (PT): Teresa Coombs   Encounter Date: 12/02/2018  PT End of Session - 12/02/18 1515    Visit Number  5    Number of Visits  12    Date for PT Re-Evaluation  12/27/18    Authorization Type  [employer] medicare    PT Start Time  4970    PT Stop Time  1100    PT Time Calculation (min)  45 min    Activity Tolerance  Patient tolerated treatment well    Behavior During Therapy  [facility] for tasks assessed/performed       Past Medical History:  Diagnosis Date  . Aneurysm of ascending aorta ([place])    a. 4.2-4.3cm in 2014.  Marland Kitchen Anxiety   . Breast cancer ([place]) 2018   right mastectomy  . Cancer ([place]) 1980   ovarian cancer  . Compression fracture of L1 lumbar vertebra ([facility])   . COMPRESSION FRACTURE, THORACIC VERTEBRA 07/24/2010   Qualifier: Diagnosis of  By: [name], [name]   . Elevated BP Transient 09/2014  . Genetic testing 07/09/2017   Multi-Cancer panel (83 genes) @ [facility] - No pathogenic mutations detected  . GERD (gastroesophageal reflux disease)   . H/O bone density study    2015  . H/O colonoscopy   . History of ovarian cancer    November 1979  . 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 Insomnia   . Normal coronary arteries Cath - 09/2014  . Osteoporosis   . Raynaud's disease     Past Surgical History:  Procedure Laterality Date  . ABDOMINAL HYSTERECTOMY    . APPENDECTOMY    . BILATERAL SALPINGOOPHORECTOMY    . BREAST LUMPECTOMY    . LEFT HEART CATHETERIZATION WITH CORONARY ANGIOGRAM N/A 10/17/2014   Procedure: LEFT HEART CATHETERIZATION WITH CORONARY ANGIOGRAM;  Surgeon: [name], MD;  Location: [facility] CATH LAB;  Service: Cardiovascular;   Laterality: N/A;  . ovarian ca s/p taj/bso  1979  . SIMPLE MASTECTOMY WITH AXILLARY SENTINEL NODE BIOPSY Right 05/05/2017   Procedure: RIGHT TOTAL MASTECTOMY WITH RIGHT  AXILLARY SENTINEL NODE BIOPSY;  Surgeon: [name], MD;  Location: [facility];  Service: General;  Laterality: Right;  PECTORAL BLOCK    There were no vitals filed for this visit.  Subjective Assessment - 12/02/18 1514    Subjective  Pt states pain in L foot, R knee, and increased pain in L knee yesterday and today. She states L lower leg and back pain is " a little better"     Currently in Pain?  Yes    Pain Score  2     Pain Location  Leg    Pain Orientation  Left    Pain Descriptors / Indicators  Aching    Pain Type  Acute pain    Pain Onset  More than a month ago    Pain Frequency  Intermittent    Pain Score  5    Pain Location  Knee    Pain Orientation  Right    Pain Descriptors / Indicators  Aching    Pain Type  Acute pain    Pain Onset  More than a month ago    Pain Frequency  Intermittent  [facility] Adult PT Treatment/Exercise - 12/02/18 1508      Exercises   Exercises  Lumbar      Lumbar Exercises: Stretches   Active Hamstring Stretch  3 reps;30 seconds    Active Hamstring Stretch Limitations  seated    Single Knee to Chest Stretch  3 reps;30 seconds    Other Lumbar Stretch Exercise  Side glides to R x15; at wall       Lumbar Exercises: Aerobic   Stationary Bike  --      Lumbar Exercises: Standing   Other Standing Lumbar Exercises  Standing March, Hip abd 2x10;     Other Standing Lumbar Exercises  Hip hinge/deadlift x10; x5 with 10 lb ;       Lumbar Exercises: Seated   Long Arc Quad on Chair  --    Sit to Stand  15 reps      Lumbar Exercises: Supine   Pelvic Tilt  --    Bent Knee Raise  --    Bent Knee Raise Limitations  --    Straight Leg Raise  15 reps    Other Supine Lumbar Exercises  Heel slides R knee x15;       Lumbar Exercises: Sidelying    Hip Abduction  20 reps      Manual Therapy   Manual Therapy  Soft tissue mobilization;Joint mobilization;Passive ROM;Manual Traction    Joint Mobilization  L lumbar Sidelying mobs    Manual Traction  Long leg distraction on L (for lumbar pump)               PT Short Term Goals - 12/02/18 1516      PT SHORT TERM GOAL #1   Title  be independent in initial HEP    Time  2    Period  Weeks    Status  Achieved    Target Date  11/29/18      PT SHORT TERM GOAL #2   Title  Pt to demo decreased pain in low back, to 0-3/10 with activit     Time  2    Period  Weeks    Status  Partially Met    Target Date  11/29/18        PT Long Term Goals - 11/15/18 1407      PT LONG TERM GOAL #1   Title  be independent in advanced /final HEP for low back and R knee .    Time  6    Period  Weeks    Status  New    Target Date  12/27/18      PT LONG TERM GOAL #2   Title  Pt to report decreased pain in low back and L lateral thigh, to 0-2/10 with activity     Time  6    Period  Weeks    Status  New    Target Date  12/27/18      PT LONG TERM GOAL #3   Title  Pt to demo proper seated posture, for optimal alignement and decreased pain     Time  6    Period  Weeks    Status  New    Target Date  12/27/18      PT LONG TERM GOAL #4   Title  Pt to demo increased LE strength to at least 4+/5 to improve stability, pain, and ambulaiton.             Plan - 12/02/18 1516  Clinical Impression Statement  Pt states minimal pain in lower leg and back during session. Discussed monitoring pain to determine times that it is most painful. Added medial arch pad to L foot, to improve arch support and decrease pain. Pt with stiffness in lumbar region, addressed with ther ex and mobilization today. Plan to progress as tolerated.     Rehab Potential  Good    PT Frequency  2x / week    PT Duration  6 weeks    PT Treatment/Interventions  ADLs/Self Care Home Management;Cryotherapy;Electrical  Stimulation;Iontophoresis 55m/ml Dexamethasone;Functional mobility training;Stair training;Gait training;Ultrasound;Moist Heat;Traction;Therapeutic activities;Therapeutic exercise;Balance training;Neuromuscular re-education;Patient/family education;Passive range of motion;Orthotic Fit/Training;Manual techniques;Dry needling;Taping;Spinal Manipulations;Joint Manipulations    Consulted and Agree with Plan of Care  Patient       Patient will benefit from skilled therapeutic intervention in order to improve the following deficits and impairments:  Abnormal gait, Pain, Increased muscle spasms, Decreased mobility, Decreased activity tolerance, Decreased endurance, Decreased range of motion, Decreased strength, Hypomobility, Difficulty walking, Impaired flexibility  Visit Diagnosis: Chronic bilateral low back pain with left-sided sciatica  Chronic pain of right knee     Problem List Patient Active Problem List   Diagnosis Date Noted  . Chronic cough 01/21/2018  . Genetic testing 07/09/2017  . history of Breast cancer of lower-outer quadrant of right female breast  01/16/2016  . Palpitations 11/13/2014  . Normal coronary arteries 10/17/2014  . Varicose veins of lower extremities with other complications 088/82/8003 . Hx of herpes simplex infection 12/18/2011  . Low back pain 06/28/2010  . Generalized anxiety disorder 10/13/2008  . Irritable bowel syndrome 10/13/2008  . Lung nodule 11/25/2007  . Raynaud's syndrome 07/01/2007  . [place] 05/06/2007  . Allergic rhinitis 05/06/2007  . Esophageal reflux 05/06/2007  . DEGENERATIVE DISC DISEASE, CERVICAL SPINE 05/06/2007  . Osteoporosis 03/18/2007  . History of ovarian cancer 09/22/1977    LLyndee Hensen PT, DPT 3:18 PM  12/02/18    [place] [address]Phone: [phone]  Fax:  [phone] Name: LSheriece JefcoatMRN: [id number]  of Birth: 321-Mar-1935    PHYSICAL THERAPY DISCHARGE SUMMARY  Visits from Start of Care: 5  Current functional level related to goals / functional outcomes: See above   Remaining deficits: See above   Education / Equipment: HEP  Plan: Patient agrees to discharge.  Patient goals were met. Patient is being discharged due to the patient's request.  ?????    Pt to follow up with Dr. STamala Julian  Recommended new referral if PT recommended.  SLaureen Abrahams PT, DPT 02/08/19 10:37 AM   [place] [address]Phone: [phone] Fax: [phone]

## 2018-12-06 ENCOUNTER — Ambulatory Visit
Admission: RE | Admit: 2018-12-06 | Discharge: 2018-12-06 | Disposition: A | Discharge status: Home / Self Care | Payer: Medicare Other | Source: Ambulatory Visit | Attending: Surgery | Admitting: Surgery

## 2018-12-06 ENCOUNTER — Other Ambulatory Visit: Payer: Self-pay

## 2018-12-06 DIAGNOSIS — I712 Thoracic aortic aneurysm, without rupture, unspecified: Secondary | ICD-10-CM

## 2018-12-06 MED ORDER — GADOBENATE DIMEGLUMINE 529 MG/ML IV SOLN
13.0000 mL | Freq: Once | INTRAVENOUS | Status: AC | PRN
  Administered 2018-12-06: 13 mL via INTRAVENOUS

## 2018-12-08 ENCOUNTER — Ambulatory Visit: Payer: Medicare Other | Admitting: Surgery

## 2018-12-08 ENCOUNTER — Encounter: Payer: Medicare Other | Admitting: Physical Therapy

## 2018-12-09 ENCOUNTER — Encounter: Payer: Self-pay | Admitting: Sports Medicine

## 2018-12-09 ENCOUNTER — Telehealth: Payer: Self-pay | Admitting: Family Medicine

## 2018-12-09 NOTE — Progress Notes (Signed)
Cindy Schwartz. Cindy Schwartz, [place] at [facility] [phone]  Cindy Schwartz - 85 y.o. female MRN [id number]  Date of birth: 13-Nov-1933  Visit Date: 12/02/2018   PCP: Cindy Olp, MD    Referred by: Cindy Olp, MD  SUBJECTIVE:   Chief Complaint  Patient presents with   Follow-up    Bilateral knee pain and L foot pain.  Not using Voltaren gel that was prescribed previously    HPI: Patient is here for evaluation of bilateral knee pain left foot pain.  She reports continued have mild pain in both knees the intermittent worsening of her left foot.  She continues to have pain and discomfort has been working with Cindy Schwartz with physical therapy but is continued to have generalized stiffness especially with first step.  She denies any numbness or tingling.  The left foot pain is occasionally throbbing and will keep her awake at night.  REVIEW OF SYSTEMS: Denies fevers, chills, recent weight gain or weight loss.  No night sweats.  Pt denies any change in bowel or bladder habits, muscle weakness, numbness or falls associated with this pain.  HISTORY:  Prior history reviewed and updated per electronic medical record.  Patient Active Problem List   Diagnosis Date Noted   Chronic cough 01/21/2018    Seeing Dr. Chase Schwartz. Trial nexium daily I/o every other day     Genetic testing 07/09/2017    Multi-Cancer panel (83 genes) @ [facility] - No pathogenic mutations detected  Genes Analyzed: 83 genes on [facility]'s Multi-Cancer panel (ALK, APC, ATM, AXIN2, BAP1, BARD1, BLM, BMPR1A, BRCA1, BRCA2, BRIP1, CASR, CDC73, CDH1, CDK4, CDKN1B, CDKN1C, CDKN2A, CEBPA, CHEK2, CTNNA1, DICER1, DIS3L2, EGFR, EPCAM, FH, FLCN, GATA2, GPC3, GREM1, HOXB13, HRAS, KIT, MAX, MEN1, MET, MITF, MLH1, MSH2, MSH3, MSH6, MUTYH, NBN, NF1, NF2, NTHL1, PALB2, PDGFRA, PHOX2B, PMS2, POLD1, POLE, POT1, PRKAR1A, PTCH1, PTEN, RAD50, RAD51C, RAD51D, RB1, RECQL4, RET, RUNX1, SDHA, SDHAF2, SDHB,  SDHC, SDHD, SMAD4, SMARCA4, SMARCB1, SMARCE1, STK11, SUFU, TERC, TERT, TMEM127, TP53, TSC1, TSC2, VHL, WRN, WT1).    history of Breast cancer of lower-outer quadrant of right female breast  01/16/2016    Mastectomy 2018. No radiation or chemo. Now with Cindy Schwartz- local surgeon     Palpitations 11/13/2014   Normal coronary arteries 10/17/2014    Follows Dr. Johnsie Schwartz every 6 months. Has had palpitations in past- PACs/PVCs    Varicose veins of lower extremities with other complications 86/57/8469   Hx of herpes simplex infection 12/18/2011    Suppressive valtrex 500 mg daily    Low back pain 06/28/2010    Occasional low back pain. History compression fracture L1. Also noted in problem list was thoracic compression fracture    Generalized anxiety disorder 10/13/2008   Irritable bowel syndrome 10/13/2008    States constipated all her life. Magnesium tends to help    Lung nodule 11/25/2007    Not noted on MRI/MRA chest 2016     Raynaud's syndrome 07/01/2007   Aneurysm of thoracic aorta ([place]) 05/06/2007    Every other year does MRI/MRA with cardiology     Allergic rhinitis 05/06/2007    Chronic cough for years- started when moved to [place]    Esophageal reflux 05/06/2007    nexium every other day     [place], CERVICAL SPINE 05/06/2007    Qualifier: Diagnosis of  By: Cindy Morale MD, [name]     Osteoporosis 03/18/2007    07/21/17 at [facility] -3.0  at lumbar spine, -2.4 left femur. fosmax for many years and notes "severe jaw damage" per [facility]     History of ovarian cancer 09/22/1977    Age 31. 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    Social History   Occupational History   Occupation: retired    Fish farm manager: RETIRED  Tobacco Use   Smoking status: Never Smoker   Smokeless tobacco: Never Used  Substance and Sexual Activity   Alcohol use: No   Drug use: No   Sexual activity: Not on file   Social History    Social History Narrative   Married. She and her husband are from the [place] area of [place]. Children: Cindy Schwartz, lives in [place], [place], lives in [place], [place]. 4 grandkids. No greatgrandkids.       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     OBJECTIVE:  VS:  HT:     WT:143 lb 9.6 oz (65.1 kg)   BMI:23.9     BP:110/66   HR:77bpm   TEMP: ( )   RESP:98 %   PHYSICAL EXAM: Adult female. No acute distress.  Alert and appropriate. Patient's bilateral lower extremities are overall well aligned with mild generalized osteoarthritic bossing of her knees.  She has pain with patellar grind that is nonfocal.  Ligamentously she is stable.  No significant effusion but small amount of synovitis.  Pain with McMurray's without overt clicking. Left foot and ankle have a small amount of swelling compared to the right with pain over the anterior syndesmosis and talar dome.  Ankle drawer testing is stable.  She has only minimal pain with midfoot palpation and midfoot stressing.   ASSESSMENT:   1. Chronic bilateral low back pain with left-sided sciatica   2. Chronic pain of both knees   3. Primary osteoarthritis of left ankle   4. Primary osteoarthritis of both knees     PROCEDURES:      PLAN:  Pertinent additional documentation may be included in corresponding procedure notes, imaging studies, problem based documentation and patient instructions.  No problem-specific Assessment & Plan notes found for this encounter.   Ultimately her symptoms are consistent with degenerative changes with underlying mild to moderate synovitis of the joints.  We discussed multiple conservative measures with her in the past and topical anti-inflammatories however she has been hesitant to use them.  Spent extensive time discussing with her today that the options to help control her pain unfortunately are limited if she is  not willing to take medications and that Celebrex would be a appropriate therapeutic option for her to try to see if we can get some improvement in her inflammatory process.  She was willing to undergo treatment for 2 weeks to see if this is beneficial and understands that other conservative measures are still recommended including physical therapy but do not seem to be providing the treatment/therapeutic response that she is looking for.  Home Therapeutic Exercises: Continue previously prescribed home exercise program Formal Physical Therapy: Continue working with physical therapy  Activity modifications and the importance of avoiding exacerbating activities (limiting pain to no more than a 4 / 10 during or following activity) recommended and discussed.   Discussed red flag symptoms that warrant earlier emergent evaluation and patient voices understanding.    Meds ordered this encounter  Medications   DISCONTD: celecoxib (CELEBREX) 100 MG capsule  Sig: Take 1 capsule (100 mg total) by mouth 2 (two) times daily.    Dispense:  60 capsule    Refill:  1   Lab Orders  No laboratory test(s) ordered today   Imaging Orders  No imaging studies ordered today   Referral Orders  No referral(s) requested today    Return in about 6 weeks (around 01/13/2019) for knee pain.          Gerda Diss, [facility] Physician

## 2018-12-09 NOTE — Telephone Encounter (Signed)
I am so sorry-it was a very busy afternoon and I did not get a chance to call patient to evaluate-that was my plan yesterday.  I am out of the office on Friday- as long as patient is not having any fever or shortness of breath-would be reasonable to see how she does over the weekend with rest and hydration.  If she has worsening wheezing would need to be seen.  She can see 1 of my colleagues on Friday if she would like- we could also schedule her for something early on Monday afternoon with me if she would prefer and she is stable

## 2018-12-09 NOTE — Telephone Encounter (Signed)
Please advise 

## 2018-12-09 NOTE — Telephone Encounter (Signed)
Post nasal drip chills throat sore on one side. Pt denies fever,cough and pt stated that it is from post nasal drip. No vomiting and nausea but pt has a headache congestion and wheezing.

## 2018-12-09 NOTE — Telephone Encounter (Signed)
Please advise. Copied from [place] [phone]. Topic: General - Other >> Dec 09, 2018  9:11 AM Keene Breath wrote: Reason for CRM: Patient called and asked to be seen only by Dr.  Yong Channel.  Checked schedule and old patient that there was no openings available today.  Patient asked if a noted could be sent over to see if Dr. Yong Channel could work her in.  Please advise and call patient back if this is a possibility.  CB# [phone]

## 2018-12-10 MED ORDER — IPRATROPIUM BROMIDE 0.06 % NA SOLN: 2.0000 | Freq: Two times a day (BID) | NASAL | 2 refills | Status: DC | PRN

## 2018-12-10 NOTE — Telephone Encounter (Signed)
Spoke to pt and informed her of Dr. Ronney Lion apologies. Pt verbalized understanding and stated that she knows the [place] virus has [place] working hard. Pt also stated that she feels a little better. Pt now c/o itchy eyes and nasal congestion. Pt claims when she was at her visit last week she wanted nasal spray (ipratropium). Pt was wanting to know if this could be called in. Pt stated that she will pick up some eye drops for her itchy eyes. Pt denies fever and SOB and claims she can wait until next week to see if she needs to come in. I advised pt that I could put her on the schedule and she can cancel if she feels better before then. Pt declined and stated that she thinks she will be fine. Pt was advised that if sx persist or worsen to seek help immediately. Pt verbalized understanding.

## 2018-12-10 NOTE — Telephone Encounter (Signed)
I sent ipratropium in for her. Please let her know. Hope she feels better- stay at home as much as possible- have someone pick up meds if she can.

## 2018-12-10 NOTE — Addendum Note (Signed)
Addended by: Marin Olp on: 12/10/2018 12:11 PM   Modules accepted: Orders

## 2018-12-10 NOTE — Telephone Encounter (Signed)
FYI Patient notified of update

## 2018-12-13 ENCOUNTER — Encounter: Payer: Medicare Other | Admitting: Physical Therapy

## 2018-12-15 ENCOUNTER — Encounter: Payer: Medicare Other | Admitting: Physical Therapy

## 2018-12-16 ENCOUNTER — Telehealth: Payer: Self-pay | Admitting: Family Medicine

## 2018-12-16 NOTE — Telephone Encounter (Signed)
FYI  Spoke to pt and informed her of Webex. Pt declined and stated that she does not have a computer, smart phone or tablet. Pt has been scheduled for a Televisit.

## 2018-12-16 NOTE — Telephone Encounter (Signed)
Copied from [place] [id number]. Topic: Quick Communication - Rx Refill/Question >> Dec 16, 2018  2:04 PM Reyne Dumas L wrote: Medication:   Pt called and left voicemail on [facility] voicemail box requesting something be called in for her.  Pt believes she has a sinus infection with congestion/headache/and feeling stuffy. Pt states she can be reached at [phone]

## 2018-12-17 ENCOUNTER — Encounter: Payer: Self-pay | Admitting: Family Medicine

## 2018-12-17 ENCOUNTER — Ambulatory Visit (INDEPENDENT_AMBULATORY_CARE_PROVIDER_SITE_OTHER): Payer: Medicare Other | Admitting: Family Medicine

## 2018-12-17 VITALS — HR 82 | Temp 96.4°F | Wt 140.0 lb

## 2018-12-17 DIAGNOSIS — J3489 Other specified disorders of nose and nasal sinuses: Secondary | ICD-10-CM | POA: Diagnosis not present

## 2018-12-17 DIAGNOSIS — R0982 Postnasal drip: Secondary | ICD-10-CM

## 2018-12-17 MED ORDER — DOXYCYCLINE HYCLATE 100 MG PO TABS: 100.0000 mg | ORAL_TABLET | Freq: Two times a day (BID) | ORAL | 0 refills | Status: AC

## 2018-12-17 NOTE — Progress Notes (Signed)
Phone [phone]   Subjective:  Virtual visit via phone  Our team/I connected with Cindy Schwartz on 12/17/18 at  9:20 AM EDT by phone (patient did not have equipment for webex) and verified that I am speaking with the correct person using two identifiers.  Location patient: [facility] Location provider: [facility], office Persons participating in the virtual visit: patient  Time on phone with patient: 15 minutes   Our team/I discussed the limitations of evaluation and management by telemedicine and the availability of in person appointments. In light of current covid-19 pandemic, patient also understands that we are trying to protect them by minimizing in office contact if at all possible.  The patient expressed consent for telemedicine visit and agreed to proceed.   ROS- no fever, chills, nausea, vomiting.    Past Medical History-  Patient Active Problem List   Diagnosis Date Noted  . history of Breast cancer of lower-outer quadrant of right female breast  01/16/2016    Priority: High  . Aneurysm of thoracic aorta ([place]) 05/06/2007    Priority: High  . History of ovarian cancer 09/22/1977    Priority: High  . Chronic cough 01/21/2018    Priority: Medium  . Normal coronary arteries 10/17/2014    Priority: Medium  . Esophageal reflux 05/06/2007    Priority: Medium  . Osteoporosis 03/18/2007    Priority: Medium  . Genetic testing 07/09/2017    Priority: Low  . Palpitations 11/13/2014    Priority: Low  . Varicose veins of lower extremities with other complications 22/10/5425    Priority: Low  . Hx of herpes simplex infection 12/18/2011    Priority: Low  . Low back pain 06/28/2010    Priority: Low  . Generalized anxiety disorder 10/13/2008    Priority: Low  . Irritable bowel syndrome 10/13/2008    Priority: Low  . Lung nodule 11/25/2007    Priority: Low  . Raynaud's syndrome 07/01/2007    Priority: Low  . Allergic rhinitis 05/06/2007    Priority: Low  . DEGENERATIVE  DISC DISEASE, CERVICAL SPINE 05/06/2007    Priority: Low    Medications- reviewed and updated Current Outpatient Medications  Medication Sig Dispense Refill  . CALCIUM PO Take 1,000 mg by mouth.    Marland Kitchen CALCIUM-MAG-VIT C-VIT D PO Take 15 mLs by mouth daily.    . celecoxib (CELEBREX) 100 MG capsule TAKE 1 CAPSULE(100 MG) BY MOUTH TWICE DAILY 180 capsule 0  . Cholecalciferol (VITAMIN D3) 1000 units CAPS Take 1,000 Units by mouth daily.    . diclofenac sodium (VOLTAREN) 1 % GEL Apply 2 g topically 4 (four) times daily. 100 g 3  . esomeprazole (NEXIUM) 40 MG capsule Take 1 capsule (40 mg total) by mouth every other day. 90 capsule 3  . ipratropium (ATROVENT) 0.06 % nasal spray Place 2 sprays into both nostrils 2 (two) times daily as needed for rhinitis. 15 mL 2  . MAGNESIUM PO Take 300 mg by mouth.    . valACYclovir (VALTREX) 500 MG tablet Take 1 tablet (500 mg total) by mouth daily. 90 tablet 3  . doxycycline (VIBRA-TABS) 100 MG tablet Take 1 tablet (100 mg total) by mouth 2 (two) times daily for 7 days. 14 tablet 0   No current facility-administered medications for this visit.      Objective:  Pulse 82   Temp (!) 96.4 F (35.8 C)   Wt 140 lb (63.5 kg)   LMP  (LMP Unknown)   SpO2 94%  BMI 23.30 kg/m  nonlabored breathing by phone Intermittent cough No obvious respiratory distress Normal speech    Assessment and Plan   #sinus pressure/post nasal drip S: post nasal drip and some nasal congestion. Left sided sinus pressure and even seems to bother her eye slightly. A lot of post nasal drip causing cough, sore throat. Also has mild chest congestion where post nasal drip runs down. Going on for a few months.   Has seen Dr. Chase Caller for chronic cough a year ago- trial of PPI did not resolve symptoms (not coughing as bad at this point). She has increased nexium to daily and not sure if that has helped very much.   We called in ipratropium for her last week- helps some but does not  clear things up. Has seen multiple ENT doctors in past and gets different opinions. Has also tried flonase in the past. Feels like eye gets red and gets mild irritation when nose runs. No headache.   Also spent time counseling about social distancing/covid 19 risks. She wants to go out and get a few things that she needs- son doesn't always get there right things. Constantly has to clear her throat.  A/P: This could certainly be allergies-with that being said she is having some left-sided sinus pressure and I believe a course of doxycycline would be reasonable to try.  I asked her to call back for a phone visit in approximately 10 days-she will keep a journal during this time- if she fails to improve then we will get more aggressive from an allergy perspective - She has tried antihistamines in the past as well as Flonase-we may end up needing to stack these together and even considering something like Singulair-though would give mood precautions  Future Appointments  Date Time Provider [place]  01/06/2019  8:45 AM [name], MD [facility]  01/13/2019  9:00 AM Gerda Diss, DO [facility]  01/26/2019  9:30 AM Gaye Pollack, MD [facility]  06/01/2019  8:00 AM Marin Olp, MD [facility]   Meds ordered this encounter  Medications  . doxycycline (VIBRA-TABS) 100 MG tablet    Sig: Take 1 tablet (100 mg total) by mouth 2 (two) times daily for 7 days.    Dispense:  14 tablet    Refill:  0    Return precautions advised.  Garret Reddish, MD

## 2018-12-17 NOTE — Patient Instructions (Signed)
Phone visit.

## 2018-12-22 ENCOUNTER — Ambulatory Visit: Payer: Medicare Other | Admitting: Surgery

## 2018-12-30 ENCOUNTER — Telehealth: Payer: Self-pay

## 2018-12-30 NOTE — Telephone Encounter (Signed)
Spoke with pt who states that she doesn't have a smartphone and only has a landline. The best number to reach pt at is [phone]. Pt has been made aware that she will receive a phone 30 minutes before scheduled appt time and to have BP and HR available. Pt verbalized understanding and thanked me for the call.

## 2018-12-30 NOTE — Telephone Encounter (Signed)
Virtual Visit Pre-Appointment Phone Call  Steps For Call:  1. Confirm consent - "In the setting of the current Covid19 crisis, you are scheduled for a (phone or video) visit with your provider on (date) at (time).  Just as we do with many in-office visits, in order for you to participate in this visit, we must obtain consent.  If you'd like, I can send this to your mychart (if signed up) or email for you to review.  Otherwise, I can obtain your verbal consent now.  All virtual visits are billed to your insurance company just like a normal visit would be.  By agreeing to a virtual visit, we'd like you to understand that the technology does not allow for your provider to perform an examination, and thus may limit your provider's ability to fully assess your condition.  Finally, though the technology is pretty good, we cannot assure that it will always work on either your or our end, and in the setting of a video visit, we may have to convert it to a phone-only visit.  In either situation, we cannot ensure that we have a secure connection.  Are you willing to proceed?"  2. Give patient instructions for WebEx download to smartphone as below if video visit  3. Advise patient to be prepared with any vital sign or heart rhythm information, their current medicines, and a piece of paper and pen handy for any instructions they may receive the day of their visit  4. Inform patient they will receive a phone call 15 minutes prior to their appointment time (may be from unknown caller ID) so they should be prepared to answer  5. Confirm that appointment type is correct in Epic appointment notes (video vs telephone)    TELEPHONE CALL NOTE  Cindy Schwartz has been deemed a candidate for a follow-up tele-health visit to limit community exposure during the Covid-19 pandemic. I spoke with the patient via phone to ensure availability of phone/video source, confirm preferred email & phone number, and discuss  instructions and expectations.  I reminded Cindy Schwartz to be prepared with any vital sign and/or heart rhythm information that could potentially be obtained via home monitoring, at the time of her visit. I reminded Cindy Schwartz to expect a phone call at the time of her visit if her visit.  Did the patient verbally acknowledge consent to treatment? YES  Cindy Schwartz, [place] 12/30/2018 12:37 PM   DOWNLOADING THE [place], go to [employer] and type in WebEx in the search bar. [place] [employer], the blue/green circle. The app is free but as with any other app downloads, their phone may require them to verify saved payment information or Apple password. The patient does NOT have to create an account.  - If Android, ask patient to go to [employer] and type in WebEx in the search bar. [place] [employer], the blue/green circle. The app is free but as with any other app downloads, their phone may require them to verify saved payment information or Android password. The patient does NOT have to create an account.   CONSENT FOR TELE-HEALTH VISIT - PLEASE REVIEW  I hereby voluntarily request, consent and authorize [facility] and its employed or contracted physicians, physician assistants, nurse practitioners or other licensed health care professionals (the Practitioner), to provide me with telemedicine health care services (the "Services") as deemed necessary by the treating Practitioner. I acknowledge and  consent to receive the Services by the Practitioner via telemedicine. I understand that the telemedicine visit will involve communicating with the Practitioner through live audiovisual communication technology and the disclosure of certain medical information by electronic transmission. I acknowledge that I have been given the opportunity to request an in-person assessment or other available alternative prior to the telemedicine visit and am  voluntarily participating in the telemedicine visit.  I understand that I have the right to withhold or withdraw my consent to the use of telemedicine in the course of my care at any time, without affecting my right to future care or treatment, and that the Practitioner or I may terminate the telemedicine visit at any time. I understand that I have the right to inspect all information obtained and/or recorded in the course of the telemedicine visit and may receive copies of available information for a reasonable fee.  I understand that some of the potential risks of receiving the Services via telemedicine include:  Marland Kitchen Delay or interruption in medical evaluation due to technological equipment failure or disruption; . Information transmitted may not be sufficient (e.g. poor resolution of images) to allow for appropriate medical decision making by the Practitioner; and/or  . In rare instances, security protocols could fail, causing a breach of personal health information.  Furthermore, I acknowledge that it is my responsibility to provide information about my medical history, conditions and care that is complete and accurate to the best of my ability. I acknowledge that Practitioner's advice, recommendations, and/or decision may be based on factors not within their control, such as incomplete or inaccurate data provided by me or distortions of diagnostic images or specimens that may result from electronic transmissions. I understand that the practice of medicine is not an exact science and that Practitioner makes no warranties or guarantees regarding treatment outcomes. I acknowledge that I will receive a copy of this consent concurrently upon execution via email to the email address I last provided but may also request a printed copy by calling the office of [facility].    I understand that my insurance will be billed for this visit.   I have read or had this consent read to me. . I understand the  contents of this consent, which adequately explains the benefits and risks of the Services being provided via telemedicine.  . I have been provided ample opportunity to ask questions regarding this consent and the Services and have had my questions answered to my satisfaction. . I give my informed consent for the services to be provided through the use of telemedicine in my medical care  By participating in this telemedicine visit I agree to the above.

## 2019-01-03 NOTE — Progress Notes (Signed)
Virtual Visit via Video Note   This visit type was conducted due to national recommendations for restrictions regarding the COVID-19 Pandemic (e.g. social distancing) in an effort to limit this patient's exposure and mitigate transmission in our community.  Due to her co-morbid illnesses, this patient is at least at moderate risk for complications without adequate follow up.  This format is felt to be most appropriate for this patient at this time.  All issues noted in this document were discussed and addressed.  A limited physical exam was performed with this format.  Please refer to the patient's chart for her consent to telehealth for [facility].   Evaluation Performed:  Follow-up visit  Date:  01/06/2019   ID:  Cindy Schwartz, DOB 05-Jul-1934, MRN [id number]  Patient Location: Home  Provider Location: Office  PCP:  Cindy Olp, MD  Cardiologist:  Cindy Rouge, MD   Electrophysiologist:  None   Chief Complaint:  Palpitations/ Dizziness  History of Present Illness:    85 y.o. with history of HTN, fusiform aortic root dilatation 4.1 cm.  Chest pain January 2016 with normal cardiac catheterization. Palpitations with monitor only showing PACs;/PVC;s  Breast cancer with mastectomy August 2018. Husband diagnosed with lung cancer at same time.   Has two grandson's one Cindy Schwartz married living in [place] and one Cindy Schwartz has been in [place] and [place] cooking with wife  Cindy Schwartz ordered TTE 01/25/18 for her chronic cough and it was normal with EF 60-65% no valve disease reviewed  Seen by Cindy Schwartz pulmonary on 06/21/18 She is not on ACE and had normal PFTls And normal walk test with no desats.   She has had recurrent issues with cough/sinusitis seen by primary and has been on doxycycline and atrovent for some wheezing Shingles on right buttocks improved after Rx Valtrex  Husband being Rx for pulmonary fibrosis cannot do much  The patient does not have symptoms concerning for  COVID-19 infection (fever, chills, cough, or new shortness of breath).    Past Medical History:  Diagnosis Date  . Aneurysm of ascending aorta ([place])    a. 4.2-4.3cm in 2014.  Marland Kitchen Anxiety   . Breast cancer ([place]) 2018   right mastectomy  . Cancer ([place]) 1980   ovarian cancer  . Compression fracture of L1 lumbar vertebra ([facility])   . COMPRESSION FRACTURE, THORACIC VERTEBRA 07/24/2010   Qualifier: Diagnosis of  By: [name], [name]   . Elevated BP Transient 09/2014  . Genetic testing 07/09/2017   Multi-Cancer panel (83 genes) @ [facility] - No pathogenic mutations detected  . GERD (gastroesophageal reflux disease)   . H/O bone density study    2015  . H/O colonoscopy   . History of ovarian cancer    November 1979  . 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 Insomnia   . Normal coronary arteries Cath - 09/2014  . Osteoporosis   . Raynaud's disease    Past Surgical History:  Procedure Laterality Date  . ABDOMINAL HYSTERECTOMY    . APPENDECTOMY    . BILATERAL SALPINGOOPHORECTOMY    . BREAST LUMPECTOMY    . LEFT HEART CATHETERIZATION WITH CORONARY ANGIOGRAM N/A 10/17/2014   Procedure: LEFT HEART CATHETERIZATION WITH CORONARY ANGIOGRAM;  Surgeon: [name], MD;  Location: [facility] CATH LAB;  Service: Cardiovascular;  Laterality: N/A;  . ovarian ca s/p taj/bso  1979  . SIMPLE MASTECTOMY WITH AXILLARY SENTINEL NODE BIOPSY Right 05/05/2017   Procedure:  RIGHT TOTAL MASTECTOMY WITH RIGHT  AXILLARY SENTINEL NODE BIOPSY;  Surgeon: [name], MD;  Location: [facility];  Service: General;  Laterality: Right;  PECTORAL BLOCK     Current Meds  Medication Sig  . CALCIUM PO Take 1,000 mg by mouth.  Marland Kitchen CALCIUM-MAG-VIT C-VIT D PO Take 15 mLs by mouth daily.  . celecoxib (CELEBREX) 100 MG capsule TAKE 1 CAPSULE(100 MG) BY MOUTH TWICE DAILY  . Cholecalciferol (VITAMIN D3) 1000 units CAPS Take 1,000 Units by mouth daily.  . diclofenac sodium  (VOLTAREN) 1 % GEL Apply 2 g topically 4 (four) times daily.  Marland Kitchen esomeprazole (NEXIUM) 40 MG capsule Take 1 capsule (40 mg total) by mouth every other day.  . ipratropium (ATROVENT) 0.06 % nasal spray Place 2 sprays into both nostrils 2 (two) times daily as needed for rhinitis.  Marland Kitchen MAGNESIUM PO Take 300 mg by mouth.  . valACYclovir (VALTREX) 500 MG tablet Take 1 tablet (500 mg total) by mouth daily.     Allergies:   Chlorhexidine gluconate   Social History   Tobacco Use  . Smoking status: Never Smoker  . Smokeless tobacco: Never Used  Substance Use Topics  . Alcohol use: No  . Drug use: No     Family Hx: The patient's family history includes Breast cancer (age of onset: 32) in her cousin; Esophageal cancer (age of onset: 23) in her cousin; Healthy in her brother; Hip fracture in her mother; Other in her father.  ROS:   Please see the history of present illness.     All other systems reviewed and are negative.   Prior CV studies:   The following studies were reviewed today:  Echo 01/25/18 Holter 09/25/15   Labs/Other Tests and Data Reviewed:    EKG    Recent Labs: 11/04/2018: ALT 16; BUN 17; Creatinine, Ser 0.62; Hemoglobin 12.9; Platelets 219.0; Potassium 4.1; Sodium 133   Recent Lipid Panel Lab Results  Component Value Date/Time   CHOL 170 11/04/2018 10:55 AM   TRIG 55.0 11/04/2018 10:55 AM   HDL 69.50 11/04/2018 10:55 AM   CHOLHDL 2 11/04/2018 10:55 AM   LDLCALC 89 11/04/2018 10:55 AM   LDLDIRECT 127.5 12/20/2012 09:05 AM    Wt Readings from Last 3 Encounters:  01/06/19 62.1 kg  12/17/18 63.5 kg  12/02/18 65.1 kg     Objective:    Vital Signs:  Ht 5\' 5"  (1.651 m)   Wt 62.1 kg   LMP  (LMP Unknown)   BMI 22.80 kg/m    Well nourished, well developed female in no acute distress. Skin warm and dry No tachypnea Post right mastectomy No JVP elevation No edema  ASSESSMENT & PLAN:     1. Dizziness/ Lightheadedness: non cardiac benign holter 09/25/15 normal  exam and no CAD at cath 2016   2. H/o PVCs/PAC: benign asymptomatic for most part PRN Inderal   3. Breast CA:  Post right mastectomy [name] 05/05/17 F/U oncology   4. Stress: related to aging and husbands diagnosis of lung cancer Improving   5. Thoracic aortic aneurysm: followed by Cindy. [name]. MRA 12/10/16 Aorta 4.1 cm observe  6. Murmur:  Benign aortic sclerosis murmur TTE 01/25/18 no significant valve disease EF 60-65%    7. Cholesterol:  LDL 113 at her age with no CAD or bad vascular disease would not Rx with statin Which is also her preference   8. Cough:  Cyclic sees pulmonary continue flonase and anti reflux measures atrovent was added Suspect  pollen and allergies making things worse currently F/U Cindy Schwartz  COVID-19 Education: The signs and symptoms of COVID-19 were discussed with the patient and how to seek care for testing (follow up with PCP or arrange E-visit).  The importance of social distancing was discussed today.  Time:   Today, I have spent 76minutes with the patient with telehealth technology discussing the above problems.      Medication Adjustments/Labs and Tests Ordered: Current medicines are reviewed at length with the patient today.  Concerns regarding medicines are outlined above.  Tests Ordered: No orders of the defined types were placed in this encounter.   Medication Changes: No orders of the defined types were placed in this encounter.   Disposition:  Follow up in a year   Signed, Cindy Rouge, MD  01/06/2019 8:18 AM    [place]

## 2019-01-06 ENCOUNTER — Encounter: Payer: Self-pay | Admitting: Cardiovascular Disease

## 2019-01-06 ENCOUNTER — Telehealth (INDEPENDENT_AMBULATORY_CARE_PROVIDER_SITE_OTHER): Payer: Medicare Other | Admitting: Cardiovascular Disease

## 2019-01-06 ENCOUNTER — Other Ambulatory Visit: Payer: Self-pay

## 2019-01-06 VITALS — Ht 65.0 in | Wt 137.0 lb

## 2019-01-06 DIAGNOSIS — R002 Palpitations: Secondary | ICD-10-CM

## 2019-01-06 NOTE — Patient Instructions (Addendum)
Medication Instructions:   If you need a refill on your cardiac medications before your next appointment, please call your pharmacy.   Lab work:  If you have labs (blood work) drawn today and your tests are completely normal, you will receive your results only by: . MyChart Message (if you have MyChart) OR . A paper copy in the mail If you have any lab test that is abnormal or we need to change your treatment, we will call you to review the results.  Testing/Procedures: None ordered today.  Follow-Up: At [facility], you and your health needs are our priority.  As part of our continuing mission to provide you with exceptional heart care, we have created designated Provider Care Teams.  These Care Teams include your primary Cardiologist (physician) and Advanced Practice Providers (APPs -  Physician Assistants and Nurse Practitioners) who all work together to provide you with the care you need, when you need it. You will need a follow up appointment in 12 months.  Please call our office 2 months in advance to schedule this appointment.  You may see Peter Nishan, MD or one of the following Advanced Practice Providers on your designated Care Team:   Lori Gerhardt, NP Kyra Ingold, NP . Jill McDaniel, NP   

## 2019-01-13 ENCOUNTER — Other Ambulatory Visit: Payer: Self-pay

## 2019-01-13 ENCOUNTER — Encounter: Payer: Self-pay | Admitting: Sports Medicine

## 2019-01-13 ENCOUNTER — Ambulatory Visit (INDEPENDENT_AMBULATORY_CARE_PROVIDER_SITE_OTHER): Payer: Medicare Other | Admitting: Sports Medicine

## 2019-01-13 VITALS — BP 130/78 | HR 75 | Temp 97.9°F | Ht 65.0 in | Wt 143.4 lb

## 2019-01-13 DIAGNOSIS — M545 Low back pain, unspecified: Secondary | ICD-10-CM

## 2019-01-13 DIAGNOSIS — M25561 Pain in right knee: Secondary | ICD-10-CM

## 2019-01-13 DIAGNOSIS — M170 Bilateral primary osteoarthritis of knee: Secondary | ICD-10-CM

## 2019-01-13 DIAGNOSIS — M19072 Primary osteoarthritis, left ankle and foot: Secondary | ICD-10-CM | POA: Diagnosis not present

## 2019-01-13 DIAGNOSIS — M5442 Lumbago with sciatica, left side: Secondary | ICD-10-CM

## 2019-01-13 DIAGNOSIS — M25562 Pain in left knee: Secondary | ICD-10-CM

## 2019-01-13 DIAGNOSIS — G8929 Other chronic pain: Secondary | ICD-10-CM

## 2019-01-13 DIAGNOSIS — K219 Gastro-esophageal reflux disease without esophagitis: Secondary | ICD-10-CM

## 2019-01-13 NOTE — Progress Notes (Signed)
Cindy Schwartz. Cindy Schwartz, [place] [facility] [place]  Emrey Thornley - 85 y.o. female MRN [id number]  Date of birth: 1934-08-14  Visit Date: January 18, 2019  PCP: Marin Olp, MD   Referred by: Marin Olp, MD  SUBJECTIVE:   Chief Complaint  Patient presents with  . Follow-up    B knee and L foot pain.  R knee injection - 09/09/18.  Taking Celebrex.  Completed 5 PT visits    HPI: Patient is here for follow-up of bilateral knee and foot pain.  She has been taking the Celebrex very intermittently and reports some improvement with this but just does not understand why she is having symptoms now with deep squatting, prolonged sitting with her knees flexed and with aggressive activity.  She was able to do physical therapy with large 5 times before COVID-19 crisis canceled this.  REVIEW OF SYSTEMS: No significant nighttime awakenings due to this issue. Denies fevers, chills, recent weight gain or weight loss.  No night sweats.  Pt denies any change in bowel or bladder habits, muscle weakness, numbness or falls associated with this pain.  HISTORY:  Prior history reviewed and updated per electronic medical record.  Patient Active Problem List   Diagnosis Date Noted  . Chronic cough 01/21/2018    Seeing Dr. Chase Caller. Trial nexium daily I/o every other day    . Genetic testing 07/09/2017    Multi-Cancer panel (83 genes) @ [facility] - No pathogenic mutations detected  Genes Analyzed: 83 genes on [facility]'s Multi-Cancer panel (ALK, APC, ATM, AXIN2, BAP1, BARD1, BLM, BMPR1A, BRCA1, BRCA2, BRIP1, CASR, CDC73, CDH1, CDK4, CDKN1B, CDKN1C, CDKN2A, CEBPA, CHEK2, CTNNA1, DICER1, DIS3L2, EGFR, EPCAM, FH, FLCN, GATA2, GPC3, GREM1, HOXB13, HRAS, KIT, MAX, MEN1, MET, MITF, MLH1, MSH2, MSH3, MSH6, MUTYH, NBN, NF1, NF2, NTHL1, PALB2, PDGFRA, PHOX2B, PMS2, POLD1, POLE, POT1, PRKAR1A, PTCH1, PTEN, RAD50, RAD51C, RAD51D, RB1, RECQL4, RET, RUNX1, SDHA,  SDHAF2, SDHB, SDHC, SDHD, SMAD4, SMARCA4, SMARCB1, SMARCE1, STK11, SUFU, TERC, TERT, TMEM127, TP53, TSC1, TSC2, VHL, WRN, WT1).   . history of Breast cancer of lower-outer quadrant of right female breast  01/16/2016    Mastectomy 2018. No radiation or chemo. Now with Dr. [name]- local surgeon    . Palpitations 11/13/2014  . Normal coronary arteries 10/17/2014    Follows Dr. Johnsie Cancel every 6 months. Has had palpitations in past- PACs/PVCs   . Varicose veins of lower extremities with other complications 40/98/1191  . Hx of herpes simplex infection 12/18/2011    Suppressive valtrex 500 mg daily   . Low back pain 06/28/2010    Occasional low back pain. History compression fracture L1. Also noted in problem list was thoracic compression fracture   . Generalized anxiety disorder 10/13/2008  . Irritable bowel syndrome 10/13/2008    States constipated all her life. Magnesium tends to help   . Lung nodule 11/25/2007    Not noted on MRI/MRA chest 2016    . Raynaud's syndrome 07/01/2007  . Aneurysm of thoracic aorta ([place]) 05/06/2007    Every other year does MRI/MRA with cardiology    . Allergic rhinitis 05/06/2007    Chronic cough for years- started when moved to [place]   . Esophageal reflux 05/06/2007    nexium every other day    . DEGENERATIVE DISC DISEASE, CERVICAL SPINE 05/06/2007    Qualifier: Diagnosis of  By: [name], Balinda Quails    . Osteoporosis 03/18/2007    07/21/17 at [place] -  3.0 at lumbar spine, -2.4 left femur. fosmax for many years and notes "severe jaw damage" per [facility]    . History of ovarian cancer 09/22/1977    Age 72. 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 Occupation: retired    Fish farm manager: RETIRED  Tobacco Use  . Smoking status: Never Smoker  . Smokeless tobacco: Never Used  Substance and Sexual Activity  . Alcohol use: No  . Drug use: No  . Sexual activity: Not on file    Social History   Social History Narrative   Married. She and her husband are from the [place] area of [place]. Children: Dessie Tatem, lives in [place], [place], lives in [place], [place]. 4 grandkids. No greatgrandkids.       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      OBJECTIVE:  VS:  HT:_0  (165.1 cm)   WT:143 lb 6.4 oz (65 kg)  BMI:23.86    BP:130/78  HR:75bpm  TEMP:97.9 F (36.6 C)( )  RESP:99 %   PHYSICAL EXAM: Adult female. No acute distress.  Alert and appropriate. Ultimately incredibly spry appearing for age.  She has great flexion extension of her knees but does have a small amount of patellar grind crepitation and slight pain.  She has no focal medial or lateral joint line pain.  Prior synovitis and swelling has resolved.  She has a small amount of pain with McMurray's but no appreciable clicking.   ASSESSMENT:   1. Chronic bilateral low back pain with left-sided sciatica   2. Primary osteoarthritis of left ankle   3. Primary osteoarthritis of both knees   4. Chronic pain of right knee   5. Chronic low back pain without sciatica, unspecified back pain laterality   6. Gastroesophageal reflux disease without esophagitis   7. Chronic pain of both knees     PROCEDURES:  None  PLAN:  Pertinent additional documentation may be included in corresponding procedure notes, imaging studies, problem based documentation and patient instructions.  No problem-specific Assessment & Plan notes found for this encounter.   Ultimately long discussion today regarding fact that the patient does have some aches and pains that are expected with age.  She has had some difficulty understanding that endrange motion is difficult on joints and as she can likely not perform some of the most aggressive activities that she previously was.  She has midfoot arthritis as well as mild arthritis in her  knee and back and will benefit from intermittent and occasional ibuprofen, NSAIDs, Tylenol, Celebrex that is been prescribed for her in the past.  She understands the appropriate bursting patterns of this and seems to be happy with the explanation provided today.  Home Therapeutic Exercises: Continue previously prescribed home exercise program   Activity modifications and the importance of avoiding exacerbating activities (limiting pain to no more than a 4 / 10 during or following activity) recommended and discussed.   Discussed red flag symptoms that warrant earlier emergent evaluation and patient voices understanding. >50% of this 25 minutes minute visit spent in direct patient counseling and/or coordination of care. Discussion was focused on education regarding the in discussing the pathoetiology and anticipated clinical course of the above condition.  No orders of the defined types were placed in this encounter.  Lab Orders  No laboratory test(s) ordered today  Imaging Orders  No imaging studies ordered today   Referral Orders  No referral(s) requested today    Return if symptoms worsen or fail to improve.          Gerda Diss, [facility] Physician

## 2019-01-18 ENCOUNTER — Encounter: Payer: Self-pay | Admitting: Sports Medicine

## 2019-01-18 NOTE — Progress Notes (Deleted)
Seems like this is working well now

## 2019-01-25 ENCOUNTER — Other Ambulatory Visit: Payer: Self-pay

## 2019-01-26 ENCOUNTER — Encounter: Payer: Self-pay | Admitting: Surgery

## 2019-01-26 ENCOUNTER — Ambulatory Visit (INDEPENDENT_AMBULATORY_CARE_PROVIDER_SITE_OTHER): Payer: Medicare Other | Admitting: Surgery

## 2019-01-26 VITALS — BP 138/72 | HR 76 | Temp 97.7°F | Resp 18 | Ht 65.0 in | Wt 138.0 lb

## 2019-01-26 DIAGNOSIS — I712 Thoracic aortic aneurysm, without rupture, unspecified: Secondary | ICD-10-CM

## 2019-01-26 NOTE — Progress Notes (Signed)
HPI:  The patient returns today for follow-up of 4.2 cm fusiform ascending aortic aneurysm.  She continues to do well since I last saw her two years ago.  She denies any chest pain or shortness of breath.    Current Outpatient Medications  Medication Sig Dispense Refill  . CALCIUM PO Take 1,000 mg by mouth.    Marland Kitchen CALCIUM-MAG-VIT C-VIT D PO Take 15 mLs by mouth daily.    . Cholecalciferol (VITAMIN D3) 1000 units CAPS Take 1,000 Units by mouth daily.    Marland Kitchen esomeprazole (NEXIUM) 40 MG capsule Take 1 capsule (40 mg total) by mouth every other day. 90 capsule 3  . MAGNESIUM PO Take 300 mg by mouth.    . valACYclovir (VALTREX) 500 MG tablet Take 1 tablet (500 mg total) by mouth daily. 90 tablet 3   No current facility-administered medications for this visit.      Physical Exam: BP 138/72   Pulse 76   Temp 97.7 F (36.5 C)   Resp 18   Ht 5\' 5"  (1.651 m)   Wt 138 lb (62.6 kg)   LMP  (LMP Unknown)   SpO2 98% Comment: on RA  BMI 22.96 kg/m  She looks well. Cardiac exam shows a regular rate and rhythm with normal heart sounds.  There is no murmur. Her lung exam is clear.  Diagnostic Tests:  CLINICAL DATA:  Thoracic aortic aneurysm, follow-up  EXAM: MRA CHEST WITH OR WITHOUT CONTRAST  TECHNIQUE: Angiographic images of the chest were obtained using MRA technique with and without intravenous contrast.  CONTRAST:  24mL MULTIHANCE GADOBENATE DIMEGLUMINE 529 MG/ML IV SOLN  COMPARISON:  12/10/2016 and previous  FINDINGS: Aorta: No significant atheromatous irregularity, dissection, or stenosis. Ascending aneurysm with luminal diameter measurements as follows:  4.0 cm sinuses of Valsalva.  3.5 cm sino-tubular junction  4.2 cm mid ascending (previously 4.2)  3.7 cm distal ascending/proximal arch  2.6 cm distal arch/proximal descending  2.5 cm distal descending  Classic 3 vessel brachiocephalic arterial origin anatomy without proximal stenosis.  Heart:  Unremarkable  Pulmonary Arteries: Unremarkable centrally; limited evaluation of subsegmental branches.  Other: No pleural or pericardial effusion.  NON-VASCULAR  Spinal cord: Unremarkable limited evaluation  Brachial plexus: Unremarkable limited evaluation  Muscles and tendons: No acute findings  Bones: Stable probable benign hemangiomas in mid thoracic vertebral bodies. No fracture or worrisome lesion.  Joints: No acute findings  IMPRESSION: VASCULAR  1. Stable 4.2 cm ascending thoracic aortic aneurysm without complicating features. Recommend annual imaging followup by CTA or MRA. This recommendation follows 2010 ACCF/AHA/AATS/ACR/ASA/SCA/SCAI/SIR/STS/SVM Guidelines for the Diagnosis and Management of Patients with Thoracic Aortic Disease. Circulation. 2010; 121: Q683-M196. Aortic aneurysm NOS (ICD10-I71.9)  NON-VASCULAR  1. No acute findings   Electronically Signed   By: Lucrezia Europe M.D.   On: 12/06/2018 13:29   Impression:  She has a stable 4.2 cm fusiform ascending aortic aneurysm which is not changed significantly since her initial CTA of the chest in 2005.  I reviewed the MRA images with her and answered her questions.  I stressed the importance of continued good blood pressure control.  She checks her blood pressure at home daily and is usually in the 120s.  Since this is been stable at 4.2 cm for so long I think it is reasonable to wait another 2 years before checking again.  She would be 87 at that time but is physiologically much younger so I think it is reasonable to continue following it.  She is in agreement with that.   Plan:  She will return to see me in 2 years with an MRA of the chest.  I spent 15 minutes performing this established patient evaluation and > 50% of this time was spent face to face counseling and coordinating the care of this patient's aortic aneurysm.    Gaye Pollack, MD [facility] [phone]

## 2019-02-08 ENCOUNTER — Telehealth: Payer: Self-pay | Admitting: Family Medicine

## 2019-02-08 ENCOUNTER — Encounter: Payer: Self-pay | Admitting: Family Medicine

## 2019-02-08 ENCOUNTER — Ambulatory Visit (INDEPENDENT_AMBULATORY_CARE_PROVIDER_SITE_OTHER): Payer: Medicare Other | Admitting: Family Medicine

## 2019-02-08 VITALS — HR 79 | Ht 65.0 in | Wt 137.0 lb

## 2019-02-08 DIAGNOSIS — M546 Pain in thoracic spine: Secondary | ICD-10-CM | POA: Diagnosis not present

## 2019-02-08 DIAGNOSIS — J302 Other seasonal allergic rhinitis: Secondary | ICD-10-CM

## 2019-02-08 MED ORDER — FLUTICASONE PROPIONATE 50 MCG/ACT NA SUSP: 2.0000 | Freq: Every day | NASAL | 3 refills | Status: DC

## 2019-02-08 NOTE — Telephone Encounter (Signed)
See note

## 2019-02-08 NOTE — Telephone Encounter (Signed)
Lets see what Dr. Tamala Julian has to say- id be open to considering bloodwork based on his recommendations. Sometimes he will order from his office.

## 2019-02-08 NOTE — Telephone Encounter (Signed)
Called pt and advised per Dr. Hunter.  

## 2019-02-08 NOTE — Telephone Encounter (Signed)
Please see message. °

## 2019-02-08 NOTE — Patient Instructions (Addendum)
There are no preventive care reminders to display for this patient.  Depression screen [facility] 2/9 11/04/2018 01/21/2018 12/26/2015  Decreased Interest 0 0 0  Down, Depressed, Hopeless 0 0 1  PHQ - 2 Score 0 0 1  Some recent data might be hidden   Video visit failed- did phone note

## 2019-02-08 NOTE — Progress Notes (Addendum)
Phone [phone]   Subjective:  Virtual visit via Video note. Chief complaint: Chief Complaint  Patient presents with  . Back Pain    sx started 3 weeks. Started 6 months.     This visit type was conducted due to national recommendations for restrictions regarding the COVID-19 Pandemic (e.g. social distancing).  This format is felt to be most appropriate for this patient at this time balancing risks to patient and risks to population by having him in for in person visit.  No physical exam was performed (except for noted visual exam or audio findings with Telehealth visits).    Our team/I connected with Javier Docker at  9:40 AM EDT by a video enabled telemedicine application (doxy.me or caregility through epic)  -Interactive audio and video telecommunications were attempted between this provider and patient, however failed, due to patient having technical difficulties OR patient did not have access to video capability.  We continued and completed visit with audio only. I verified that I am speaking with the correct person using two identifiers.  Location patient: [place] Location provider: [facility], office Persons participating in the virtual visit:  patient   Our team/I discussed the limitations of evaluation and management by telemedicine and the availability of in person appointments. In light of current covid-19 pandemic, patient also understands that we are trying to protect them by minimizing in office contact if at all possible.  The patient expressed consent for telemedicine visit and agreed to proceed. Patient understands insurance will be billed.   ROS- No saddle anesthesia, bladder incontinence, fecal incontinence, weakness in extremity.  History negative for trauma, history of cancer recently ovarian and breast in the past, fever, chills, unintentional weight loss, recent bacterial infection, recent IV drug use, HIV, pain worse at night or while supine. Some paresthesias but  has known carpal tunnel from years ago. No  chills, cough, shortness of breath, body aches outside of the back, sore throat, or loss of taste or smell   Past Medical History-  Patient Active Problem List   Diagnosis Date Noted  . history of Breast cancer of lower-outer quadrant of right female breast  01/16/2016    Priority: High  . Aneurysm of thoracic aorta ([place]) 05/06/2007    Priority: High  . History of ovarian cancer 09/22/1977    Priority: High  . Chronic cough 01/21/2018    Priority: Medium  . Normal coronary arteries 10/17/2014    Priority: Medium  . Esophageal reflux 05/06/2007    Priority: Medium  . Osteoporosis 03/18/2007    Priority: Medium  . Genetic testing 07/09/2017    Priority: Low  . Palpitations 11/13/2014    Priority: Low  . Varicose veins of lower extremities with other complications 56/38/7564    Priority: Low  . Hx of herpes simplex infection 12/18/2011    Priority: Low  . Low back pain 06/28/2010    Priority: Low  . Generalized anxiety disorder 10/13/2008    Priority: Low  . Irritable bowel syndrome 10/13/2008    Priority: Low  . Lung nodule 11/25/2007    Priority: Low  . Raynaud's syndrome 07/01/2007    Priority: Low  . Allergic rhinitis 05/06/2007    Priority: Low  . DEGENERATIVE DISC DISEASE, CERVICAL SPINE 05/06/2007    Priority: Low    Medications- reviewed and updated Current Outpatient Medications  Medication Sig Dispense Refill  . CALCIUM PO Take 1,000 mg by mouth.    . Cholecalciferol (VITAMIN D3) 1000 units CAPS Take 1,000  Units by mouth daily.    Marland Kitchen esomeprazole (NEXIUM) 40 MG capsule Take 1 capsule (40 mg total) by mouth every other day. 90 capsule 3  . MAGNESIUM PO Take 300 mg by mouth.    Marland Kitchen CALCIUM-MAG-VIT C-VIT D PO Take 15 mLs by mouth daily.    . fluticasone (FLONASE) 50 MCG/ACT nasal spray Place 2 sprays into both nostrils daily. 16 g 3  . valACYclovir (VALTREX) 500 MG tablet Take 1 tablet (500 mg total) by mouth daily.  (Patient not taking: Reported on 02/08/2019) 90 tablet 3   No current facility-administered medications for this visit.       Objective:  Pulse 79   Ht 5\' 5"  (1.651 m)   Wt 137 lb (62.1 kg)   LMP  (LMP Unknown)   SpO2 97%   BMI 22.80 kg/m  self reported vitals  Nonlabored voice, normal speech     Assessment and Plan   # Acute bilateral thoracic back pain - Plan: Ambulatory referral to Sports Medicine S:Patient is continuing to have low back pain- she thinks she has had it about 6 months. Back pain has been worening for last 2.5- 3 weeks. Sounds like has thoracic back pain radiating through between her shoulder blades. Has had lumbar spine films in January- had chronic compression fracture of t12 and L1 but unchanged from 2018 also had mild degenerative disc disease. Feels like hurts centrally in her spine.   No chest pain or shortness of breath with this. Just had MRA chest with stable 4.2 cm ascending thoracic aortic aneurysm  Also aching in her thumb and index fingers  A/P: 85 year old patient with thoracic back pain that seems to be worsening and includes midline pain- needs further evaluation with at least x-rays. She wanted orthopedics or SM consultation- will refer to Dr. Tamala Julian. She initially preferred ortho but I explained even if we refer to ortho- likely to see a sports medicine physician to start- I think she will enjoy working with Dr. Tamala Julian  Also has R knee pain and failed injection in past with DR. Paulla Fore- would be another potential discussion with Dr. Tamala Julian.   # allergic rhinitis S:mild sinus pressure on left, post nasal drip- all resolved for 2 months with antibiotics. Not takign any allergy medicine  A/P: advised OTC antihistamine and flonase stacked together for 3 weeks- if no better to call back. Doesn't technically have covid 19 symptoms as only having dripping in throat and sinus pressure so thought reasonable to see Dr. Tamala Julian as long as wears mask.  Future  Appointments  Date Time Provider [place]  06/01/2019  8:00 AM Marin Olp, MD [facility]    Lab/Order associations: Acute bilateral thoracic back pain - Plan: Ambulatory referral to Sports Medicine  Meds ordered this encounter  Medications  . fluticasone (FLONASE) 50 MCG/ACT nasal spray    Sig: Place 2 sprays into both nostrils daily.    Dispense:  16 g    Refill:  3    Return precautions advised.  Garret Reddish, MD

## 2019-02-08 NOTE — Telephone Encounter (Signed)
Copied from [place] [phone]. Topic: General - Inquiry >> Feb 08, 2019 11:02 AM Margot Ables wrote: Reason for CRM: Pt states she forgot to ask Dr. Yong Channel about a blood test she would like to have. She is wanting to check for arthritis and inflammation. Please call back to advise at [phone].

## 2019-02-10 NOTE — Addendum Note (Signed)
Addended by: Marin Olp on: 02/10/2019 08:29 PM   Modules accepted: Level of Service

## 2019-02-17 ENCOUNTER — Ambulatory Visit
Admission: RE | Admit: 2019-02-17 | Discharge: 2019-02-17 | Disposition: A | Discharge status: Home / Self Care | Payer: Medicare Other | Source: Ambulatory Visit | Attending: Physician Assistant | Admitting: Physician Assistant

## 2019-02-17 ENCOUNTER — Other Ambulatory Visit: Payer: Self-pay | Admitting: Physician Assistant

## 2019-02-17 DIAGNOSIS — R634 Abnormal weight loss: Secondary | ICD-10-CM

## 2019-02-17 DIAGNOSIS — K5904 Chronic idiopathic constipation: Secondary | ICD-10-CM

## 2019-02-17 DIAGNOSIS — R109 Unspecified abdominal pain: Secondary | ICD-10-CM

## 2019-02-17 NOTE — Progress Notes (Signed)
[facility] [address] [place], [place] [address] Phone: [phone] Subjective:   [name], am serving as a scribe for Dr. Hulan Saas.   CC: Back pain follow-up  [id number]  Cindy Schwartz is a 85 y.o. female coming in with complaint of lumbar, thoracic, cervical spine pain, knee pain, calf pain and foot pain. Patient was seeing Dr. Paulla Fore for bilateral knee pain. Is complaining of left great toe pain with walking. Patient tries to take break during the day if her pain increases. Also uses heat to treat her pain. Pain is intermittent. Standing makes her pain worse. States that she is always on her feet shopping, cleaning, cooking and taking care of her ailing husband. Has been using Tylenol for her pain prn.  Patient was in avoid any type of multiple medications if possible.   Patient did have x-rays lumbar spine done previously.  These were independently visualized by me.  Show ongoing compression fractures of what seems to be stable from 2018 Past Medical History:  Diagnosis Date  . Aneurysm of ascending aorta ([place])    a. 4.2-4.3cm in 2014.  Marland Kitchen Anxiety   . Breast cancer ([place]) 2018   right mastectomy  . Cancer ([place]) 1980   ovarian cancer  . Compression fracture of L1 lumbar vertebra ([facility])   . COMPRESSION FRACTURE, THORACIC VERTEBRA 07/24/2010   Qualifier: Diagnosis of  By: [name], [name]   . Elevated BP Transient 09/2014  . Genetic testing 07/09/2017   Multi-Cancer panel (83 genes) @ [facility] - No pathogenic mutations detected  . GERD (gastroesophageal reflux disease)   . H/O bone density study    2015  . H/O colonoscopy   . History of ovarian cancer    November 1979  . 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 Insomnia   . Normal coronary arteries Cath - 09/2014  . Osteoporosis   . Raynaud's disease    Past Surgical History:  Procedure Laterality Date  . ABDOMINAL  HYSTERECTOMY    . APPENDECTOMY    . BILATERAL SALPINGOOPHORECTOMY    . BREAST LUMPECTOMY    . LEFT HEART CATHETERIZATION WITH CORONARY ANGIOGRAM N/A 10/17/2014   Procedure: LEFT HEART CATHETERIZATION WITH CORONARY ANGIOGRAM;  Surgeon: [name], MD;  Location: [facility] CATH LAB;  Service: Cardiovascular;  Laterality: N/A;  . ovarian ca s/p taj/bso  1979  . SIMPLE MASTECTOMY WITH AXILLARY SENTINEL NODE BIOPSY Right 05/05/2017   Procedure: RIGHT TOTAL MASTECTOMY WITH RIGHT  AXILLARY SENTINEL NODE BIOPSY;  Surgeon: [name], MD;  Location: [facility];  Service: General;  Laterality: Right;  PECTORAL BLOCK   Social History   Socioeconomic History  . Marital status: Married    Spouse name: Publishing copy  . Number of children: 2  . Years of education: Not on file  . Highest education level: Not on file  Occupational History  . Occupation: retired    Fish farm manager: RETIRED  Social Needs  . Financial resource strain: Not on file  . Food insecurity:    Worry: Not on file    Inability: Not on file  . Transportation needs:    Medical: Not on file    Non-medical: Not on file  Tobacco Use  . Smoking status: Never Smoker  . Smokeless tobacco: Never Used  Substance and Sexual Activity  . Alcohol use: No  . Drug use: No  . Sexual activity: Not on file  Lifestyle  .  Physical activity:    Days per week: Not on file    Minutes per session: Not on file  . Stress: Not on file  Relationships  . Social connections:    Talks on phone: Not on file    Gets together: Not on file    Attends religious service: Not on file    Active member of club or organization: Not on file    Attends meetings of clubs or organizations: Not on file    Relationship status: Not on file  Other Topics Concern  . Not on file  Social History Narrative   Married. She and her husband are from the [place] area of [place]. Children: [place], lives in [place], [place], lives in [place], [place]. 4  grandkids. No greatgrandkids.       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    Allergies  Allergen Reactions  . Chlorhexidine Gluconate Rash   Family History  Problem Relation Age of Onset  . Hip fracture Mother   . Other Father        lived to 21  . Healthy Brother   . Breast cancer Cousin 38       mat first cousin related through uncle  . Esophageal cancer Cousin 44       mat first cousin related through uncle      Current Outpatient Medications (Respiratory):  .  fluticasone (FLONASE) 50 MCG/ACT nasal spray, Place 2 sprays into both nostrils daily.    Current Outpatient Medications (Other):  Marland Kitchen  CALCIUM PO, Take 1,000 mg by mouth. Marland Kitchen  CALCIUM-MAG-VIT C-VIT D PO, Take 15 mLs by mouth daily. .  Cholecalciferol (VITAMIN D3) 1000 units CAPS, Take 1,000 Units by mouth daily. Marland Kitchen  esomeprazole (NEXIUM) 40 MG capsule, Take 1 capsule (40 mg total) by mouth every other day. Marland Kitchen  MAGNESIUM PO, Take 300 mg by mouth. .  valACYclovir (VALTREX) 500 MG tablet, Take 1 tablet (500 mg total) by mouth daily. .  Vitamin D, Ergocalciferol, (DRISDOL) 1.25 MG (50000 UT) CAPS capsule, Take 1 capsule (50,000 Units total) by mouth every 7 (seven) days.    Past medical history, social, surgical and family history all reviewed in electronic medical record.  No pertanent information unless stated regarding to the chief complaint.   Review of Systems:  No headache, visual changes, nausea, vomiting, diarrhea, constipation, dizziness, abdominal pain, skin rash, fevers, chills, night sweats, weight loss, swollen lymph nodes, body aches, joint swelling,  chest pain, shortness of breath, mood changes.  Positive muscle aches  Objective  Blood pressure 110/62, pulse 78, height 5\' 5"  (1.651 m), weight 137 lb (62.1 kg), SpO2 98 %.    General: No apparent distress alert and oriented x3 mood and affect normal, dressed appropriately.  HEENT:  Pupils equal, extraocular movements intact  Respiratory: Patient's speak in full sentences and does not appear short of breath  Cardiovascular: No lower extremity edema, non tender, no erythema  Skin: Warm dry intact with no signs of infection or rash on extremities or on axial skeleton.  Abdomen: Soft nontender  Neuro: Cranial nerves II through XII are intact, neurovascularly intact in all extremities with 2+ DTRs and 2+ pulses.  Lymph: No lymphadenopathy of posterior or anterior cervical chain or axillae bilaterally.  Gait mild antalgic MSK: Arthritic changes of multiple joints Patient back exam does have loss of lordosis.  Some degenerative scoliosis noted.  Tender to palpation of the paraspinal musculature lumbar spine right greater than left.  Negative straight leg test but tightness of the hamstrings.  Mild tightness with Corky Sox test as well.    [id number]; 15 additional minutes spent for Therapeutic exercises as stated in above notes.  This included exercises focusing on stretching, strengthening, with significant focus on eccentric aspects.   Long term goals include an improvement in range of motion, strength, endurance as well as avoiding reinjury. Patient's frequency would include in 1-2 times a day, 3-5 times a week for a duration of 6-12 weeks. Low back exercises that included:  Pelvic tilt/bracing instruction to focus on control of the pelvic girdle and lower abdominal muscles  Glute strengthening exercises, focusing on proper firing of the glutes without engaging the low back muscles Proper stretching techniques for maximum relief for the hamstrings, hip flexors, low back and some rotation where tolerated   Proper technique shown and discussed handout in great detail with ATC.  All questions were discussed and answered.   Impression and Recommendations:     This case required medical decision making of moderate complexity. The above documentation has been reviewed and is accurate and  complete Lyndal Pulley, DO       Note: This dictation was prepared with Dragon dictation along with smaller phrase technology. Any transcriptional errors that result from this process are unintentional.

## 2019-02-18 ENCOUNTER — Encounter: Payer: Self-pay | Admitting: Family Medicine

## 2019-02-18 ENCOUNTER — Other Ambulatory Visit: Payer: Self-pay

## 2019-02-18 ENCOUNTER — Ambulatory Visit (INDEPENDENT_AMBULATORY_CARE_PROVIDER_SITE_OTHER): Payer: Medicare Other | Admitting: Family Medicine

## 2019-02-18 VITALS — BP 110/62 | HR 78 | Ht 65.0 in | Wt 137.0 lb

## 2019-02-18 DIAGNOSIS — Z8543 Personal history of malignant neoplasm of ovary: Secondary | ICD-10-CM

## 2019-02-18 DIAGNOSIS — M545 Low back pain, unspecified: Secondary | ICD-10-CM

## 2019-02-18 DIAGNOSIS — Z853 Personal history of malignant neoplasm of breast: Secondary | ICD-10-CM

## 2019-02-18 DIAGNOSIS — M5030 Other cervical disc degeneration, unspecified cervical region: Secondary | ICD-10-CM | POA: Diagnosis not present

## 2019-02-18 DIAGNOSIS — G8929 Other chronic pain: Secondary | ICD-10-CM

## 2019-02-18 MED ORDER — VITAMIN D (ERGOCALCIFEROL) 1.25 MG (50000 UNIT) PO CAPS: 50000.0000 [IU] | ORAL_CAPSULE | ORAL | 0 refills | Status: DC

## 2019-02-18 NOTE — Patient Instructions (Signed)
Good to see you  Ice is your friend Once weekly vitamin D for 12 weeks  Over the counter get  Turmeric 500mg  daily  Tart cherry extract 1200mg  at night  See me again in 6-8 weeks to make sure you are doing well

## 2019-02-18 NOTE — Assessment & Plan Note (Signed)
History of degenerative disc disease and multiple other problems.  Patient has a past medical history significant for cancer as well as a aortic dissection.  Patient continues to have discomfort and pain though.  Patient does have a significant arthritic changes on x-rays previously.  Patient wants to avoid certain medications.  Discussed with patient about icing regimen, home exercises, which activities of doing which wants to avoid.  Follow-up again in 4 to 8 weeks

## 2019-02-23 ENCOUNTER — Other Ambulatory Visit: Payer: Self-pay | Admitting: Family Medicine

## 2019-03-01 ENCOUNTER — Encounter: Payer: Self-pay | Admitting: Physical Therapy

## 2019-03-01 DIAGNOSIS — K5904 Chronic idiopathic constipation: Secondary | ICD-10-CM | POA: Insufficient documentation

## 2019-04-07 NOTE — Progress Notes (Signed)
[facility] [place], [place] [address] Phone: [phone] Subjective:   Cindy Schwartz, am serving as a scribe for Dr. Hulan Saas.  I'm seeing this patient by the request  of:    CC:   WEX:[id number]   02/18/2019: History of degenerative disc disease and multiple other problems.  Patient has a past medical history significant for cancer as well as a aortic dissection.  Patient continues to have discomfort and pain though.  Patient does have a significant arthritic changes on x-rays previously.  Patient wants to avoid certain medications.  Discussed with patient about icing regimen, home exercises, which activities of doing which wants to avoid.  Follow-up again in 4 to 8 weeks  Update: 04/08/2019: Cindy Schwartz is a 85 y.o. female coming in with complaint of back pain. Is using supplements. Pain is less in the lower back.  Primary complaint today is bilateral knee pain L>R. Pain with walking. Is unable to perform ADLs as she is not able to squat without pain. Wakes up in middle of night with pain and stiffness is present in the mornings. Has not used ice or compression. Did receive injection from Dr. Paulla Fore in December 2019.      Past Medical History:  Diagnosis Date  . Aneurysm of ascending aorta ([facility])    a. 4.2-4.3cm in 2014.  Marland Kitchen Anxiety   . Breast cancer ([facility]) 2018   right mastectomy  . Cancer ([facility]) 1980   ovarian cancer  . Compression fracture of L1 lumbar vertebra ([facility])   . COMPRESSION FRACTURE, THORACIC VERTEBRA 07/24/2010   Qualifier: Diagnosis of  By: [name], [name]   . Elevated BP Transient 09/2014  . Genetic testing 07/09/2017   Multi-Cancer panel (83 genes) @ [facility] - Schwartz pathogenic mutations detected  . GERD (gastroesophageal reflux disease)   . H/O bone density study    2015  . H/O colonoscopy   . History of ovarian cancer    November 1979  . History of ovarian cancer 1979   Infiltrating well differentiated papillary  serous cystadenoma  . Insomnia   . Normal coronary arteries Cath - 09/2014  . Osteoporosis   . Raynaud's disease    Past Surgical History:  Procedure Laterality Date  . ABDOMINAL HYSTERECTOMY    . APPENDECTOMY    . BILATERAL SALPINGOOPHORECTOMY    . BREAST LUMPECTOMY    . LEFT HEART CATHETERIZATION WITH CORONARY ANGIOGRAM N/A 10/17/2014   Procedure: LEFT HEART CATHETERIZATION WITH CORONARY ANGIOGRAM;  Surgeon: [name], MD;  Location: [facility] CATH LAB;  Service: Cardiovascular;  Laterality: N/A;  . ovarian ca s/p taj/bso  1979  . SIMPLE MASTECTOMY WITH AXILLARY SENTINEL NODE BIOPSY Right 05/05/2017   Procedure: RIGHT TOTAL MASTECTOMY WITH RIGHT  AXILLARY SENTINEL NODE BIOPSY;  Surgeon: [name], MD;  Location: [facility];  Service: General;  Laterality: Right;  PECTORAL BLOCK   Social History   Socioeconomic History  . Marital status: Married    Spouse name: Publishing copy  . Number of children: 2  . Years of education: Not on file  . Highest education level: Not on file  Occupational History  . Occupation: retired    Fish farm manager: RETIRED  Social Needs  . Financial resource strain: Not on file  . Food insecurity    Worry: Not on file    Inability: Not on file  . Transportation needs    Medical: Not on file    Non-medical: Not on file  Tobacco Use  . Smoking status: Never Smoker  . Smokeless tobacco: Never Used  Substance and Sexual Activity  . Alcohol use: Schwartz  . Drug use: Schwartz  . Sexual activity: Not on file  Lifestyle  . Physical activity    Days per week: Not on file    Minutes per session: Not on file  . Stress: Not on file  Relationships  . Social Herbalist on phone: Not on file    Gets together: Not on file    Attends religious service: Not on file    Active member of club or organization: Not on file    Attends meetings of clubs or organizations: Not on file    Relationship status: Not on file  Other Topics Concern  . Not on file  Social History  Narrative   Married. She and her husband are from the [place] area of [place]. Children: Karrigan Messamore, lives in [place], [place], lives in [place], [place]. 4 grandkids. Schwartz greatgrandkids.       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    Allergies  Allergen Reactions  . Chlorhexidine Gluconate Rash   Family History  Problem Relation Age of Onset  . Hip fracture Mother   . Other Father        lived to 69  . Healthy Brother   . Breast cancer Cousin 51       mat first cousin related through uncle  . Esophageal cancer Cousin 14       mat first cousin related through uncle      Current Outpatient Medications (Respiratory):  .  fluticasone (FLONASE) 50 MCG/ACT nasal spray, Place 2 sprays into both nostrils daily.    Current Outpatient Medications (Other):  Marland Kitchen  CALCIUM PO, Take 1,000 mg by mouth. Marland Kitchen  CALCIUM-MAG-VIT C-VIT D PO, Take 15 mLs by mouth daily. .  Cholecalciferol (VITAMIN D3) 1000 units CAPS, Take 1,000 Units by mouth daily. Marland Kitchen  MAGNESIUM PO, Take 300 mg by mouth. Marland Kitchen  NEXIUM 40 MG capsule, TAKE 1 CAPSULE EVERY OTHER DAY .  valACYclovir (VALTREX) 500 MG tablet, Take 1 tablet (500 mg total) by mouth daily. .  Vitamin D, Ergocalciferol, (DRISDOL) 1.25 MG (50000 UT) CAPS capsule, Take 1 capsule (50,000 Units total) by mouth every 7 (seven) days.    Past medical history, social, surgical and family history all reviewed in electronic medical record.  Schwartz pertanent information unless stated regarding to the chief complaint.   Review of Systems:  Schwartz headache, visual changes, nausea, vomiting, diarrhea, constipation, dizziness, abdominal pain, skin rash, fevers, chills, night sweats, weight loss, swollen lymph nodes, body aches, joint swelling, muscle aches, chest pain, shortness of breath, mood changes.   Objective  There were Schwartz vitals taken for this visit.   General: Schwartz apparent  distress alert and oriented x3 mood and affect normal, dressed appropriately.  HEENT: Pupils equal, extraocular movements intact  Respiratory: Patient's speak in full sentences and does not appear short of breath  Cardiovascular: Schwartz lower extremity edema, non tender, Schwartz erythema  Skin: Warm dry intact with Schwartz signs of infection or rash on extremities or on axial skeleton.  Abdomen: Soft nontender  Neuro: Cranial nerves II through XII are intact, neurovascularly intact in all extremities with 2+ DTRs and 2+ pulses.  Lymph: Schwartz lymphadenopathy of posterior or anterior cervical chain or  axillae bilaterally.  Gait mild antalgic MSK:  tender with full range of motion and good stability and symmetric strength and tone of shoulders, elbows, wrist, hip and ankles bilaterally.  Knee: Bilateral valgus deformity noted. Large thigh to calf ratio.  Tender to palpation over medial and PF joint line.  ROM full in flexion and extension and lower leg rotation. Mild instability bilaterally but grossly intact.  painful patellar compression. Patellar glide with moderate crepitus. Patellar and quadriceps tendons unremarkable. Hamstring and quadriceps strength is normal.     Impression and Recommendations:     This case required medical decision making of moderate complexity. The above documentation has been reviewed and is accurate and complete Cindy Pulley, DO       Note: This dictation was prepared with Dragon dictation along with smaller phrase technology. Any transcriptional errors that result from this process are unintentional.

## 2019-04-08 ENCOUNTER — Ambulatory Visit (INDEPENDENT_AMBULATORY_CARE_PROVIDER_SITE_OTHER): Payer: Medicare Other | Admitting: Family Medicine

## 2019-04-08 ENCOUNTER — Other Ambulatory Visit: Payer: Self-pay

## 2019-04-08 ENCOUNTER — Encounter: Payer: Self-pay | Admitting: Family Medicine

## 2019-04-08 VITALS — BP 110/62 | HR 75 | Ht 65.0 in | Wt 142.0 lb

## 2019-04-08 DIAGNOSIS — M170 Bilateral primary osteoarthritis of knee: Secondary | ICD-10-CM | POA: Diagnosis not present

## 2019-04-08 NOTE — Patient Instructions (Signed)
PT [address] Voltaren 2x daily Ice 20 min 2x a day See me in 6 weeks

## 2019-04-08 NOTE — Assessment & Plan Note (Signed)
Degenerative arthritis of the knees bilaterally. Do believe the patient will do well with injections the patient declined.  Once more, strengthening. Physical therapy.  Encourage patient continue the other vitamin supplementations icing regimen.  Trial of topical anti-inflammatories given.  Follow-up again in 4 to 8 weeks.  Future considerations include injections including steroids and Visco supplementation

## 2019-04-29 ENCOUNTER — Other Ambulatory Visit: Payer: Self-pay | Admitting: General Surgery

## 2019-04-29 DIAGNOSIS — Z1231 Encounter for screening mammogram for malignant neoplasm of breast: Secondary | ICD-10-CM

## 2019-05-10 ENCOUNTER — Ambulatory Visit
Admission: RE | Admit: 2019-05-10 | Discharge: 2019-05-10 | Disposition: A | Discharge status: Home / Self Care | Payer: Medicare Other | Source: Ambulatory Visit | Attending: General Surgery | Admitting: General Surgery

## 2019-05-10 ENCOUNTER — Other Ambulatory Visit: Payer: Self-pay

## 2019-05-10 DIAGNOSIS — Z1231 Encounter for screening mammogram for malignant neoplasm of breast: Secondary | ICD-10-CM

## 2019-05-11 ENCOUNTER — Other Ambulatory Visit: Payer: Self-pay | Admitting: General Surgery

## 2019-05-11 DIAGNOSIS — R928 Other abnormal and inconclusive findings on diagnostic imaging of breast: Secondary | ICD-10-CM

## 2019-05-17 ENCOUNTER — Ambulatory Visit
Admission: RE | Admit: 2019-05-17 | Discharge: 2019-05-17 | Disposition: A | Discharge status: Home / Self Care | Payer: Medicare Other | Source: Ambulatory Visit | Attending: General Surgery | Admitting: General Surgery

## 2019-05-17 ENCOUNTER — Ambulatory Visit: Payer: Medicare Other

## 2019-05-17 ENCOUNTER — Other Ambulatory Visit: Payer: Self-pay

## 2019-05-17 DIAGNOSIS — R928 Other abnormal and inconclusive findings on diagnostic imaging of breast: Secondary | ICD-10-CM

## 2019-05-20 ENCOUNTER — Ambulatory Visit: Payer: Medicare Other | Admitting: Family Medicine

## 2019-06-01 ENCOUNTER — Ambulatory Visit (INDEPENDENT_AMBULATORY_CARE_PROVIDER_SITE_OTHER): Payer: Medicare Other | Admitting: Family Medicine

## 2019-06-01 ENCOUNTER — Encounter: Payer: Self-pay | Admitting: Family Medicine

## 2019-06-01 ENCOUNTER — Other Ambulatory Visit: Payer: Self-pay

## 2019-06-01 VITALS — BP 108/70 | HR 68 | Temp 97.7°F | Ht 65.0 in | Wt 142.2 lb

## 2019-06-01 DIAGNOSIS — E785 Hyperlipidemia, unspecified: Secondary | ICD-10-CM | POA: Diagnosis not present

## 2019-06-01 DIAGNOSIS — R311 Benign essential microscopic hematuria: Secondary | ICD-10-CM

## 2019-06-01 DIAGNOSIS — R319 Hematuria, unspecified: Secondary | ICD-10-CM

## 2019-06-01 DIAGNOSIS — M818 Other osteoporosis without current pathological fracture: Secondary | ICD-10-CM | POA: Diagnosis not present

## 2019-06-01 DIAGNOSIS — Z23 Encounter for immunization: Secondary | ICD-10-CM | POA: Diagnosis not present

## 2019-06-01 DIAGNOSIS — Z79899 Other long term (current) drug therapy: Secondary | ICD-10-CM

## 2019-06-01 DIAGNOSIS — I712 Thoracic aortic aneurysm, without rupture, unspecified: Secondary | ICD-10-CM

## 2019-06-01 DIAGNOSIS — K219 Gastro-esophageal reflux disease without esophagitis: Secondary | ICD-10-CM | POA: Diagnosis not present

## 2019-06-01 DIAGNOSIS — Z0000 Encounter for general adult medical examination without abnormal findings: Secondary | ICD-10-CM

## 2019-06-01 LAB — URINALYSIS, MICROSCOPIC ONLY

## 2019-06-01 LAB — LIPID PANEL
Cholesterol: 167 mg/dL (ref 0–200)
HDL: 60.9 mg/dL (ref >39.00)
LDL Cholesterol: 95 mg/dL (ref 0–99)
NonHDL: 105.63
Total CHOL/HDL Ratio: 3
Triglycerides: 51 mg/dL (ref 0.0–149.0)
VLDL: 10.2 mg/dL (ref 0.0–40.0)

## 2019-06-01 LAB — POC URINALSYSI DIPSTICK (AUTOMATED)
Bilirubin, UA: NEGATIVE
Blood, UA: POSITIVE
Glucose, UA: NEGATIVE
Ketones, UA: NEGATIVE
Leukocytes, UA: NEGATIVE
Nitrite, UA: NEGATIVE
Protein, UA: NEGATIVE
Spec Grav, UA: 1.01 (ref 1.010–1.025)
Urobilinogen, UA: 0.2 E.U./dL
pH, UA: 7.5 (ref 5.0–8.0)

## 2019-06-01 LAB — COMPREHENSIVE METABOLIC PANEL
ALT: 11 U/L (ref 0–35)
AST: 17 U/L (ref 0–37)
Albumin: 4 g/dL (ref 3.5–5.2)
Alkaline Phosphatase: 71 U/L (ref 39–117)
BUN: 14 mg/dL (ref 6–23)
CO2: 31 mEq/L (ref 19–32)
Calcium: 9.4 mg/dL (ref 8.4–10.5)
Chloride: 96 mEq/L (ref 96–112)
Creatinine, Ser: 0.6 mg/dL (ref 0.40–1.20)
GFR: 95.37 mL/min (ref >60.00)
Glucose, Bld: 75 mg/dL (ref 70–99)
Potassium: 4.1 mEq/L (ref 3.5–5.1)
Sodium: 133 mEq/L — ABNORMAL LOW (ref 135–145)
Total Bilirubin: 0.5 mg/dL (ref 0.2–1.2)
Total Protein: 7 g/dL (ref 6.0–8.3)

## 2019-06-01 LAB — VITAMIN B12: Vitamin B-12: 588 pg/mL (ref 211–911)

## 2019-06-01 LAB — CBC
HCT: 40.2 % (ref 36.0–46.0)
Hemoglobin: 13.4 g/dL (ref 12.0–15.0)
MCHC: 33.2 g/dL (ref 30.0–36.0)
MCV: 85.5 fl (ref 78.0–100.0)
Platelets: 239 10*3/uL (ref 150.0–400.0)
RBC: 4.71 Mil/uL (ref 3.87–5.11)
RDW: 13.9 % (ref 11.5–15.5)
WBC: 4.6 10*3/uL (ref 4.0–10.5)

## 2019-06-01 LAB — VITAMIN D 25 HYDROXY (VIT D DEFICIENCY, FRACTURES): VITD: 71.51 ng/mL (ref 30.00–100.00)

## 2019-06-01 MED ORDER — TIZANIDINE HCL 2 MG PO TABS: 2.0000 mg | ORAL_TABLET | Freq: Every evening | ORAL | 0 refills | Status: DC | PRN

## 2019-06-01 MED ORDER — ESOMEPRAZOLE MAGNESIUM 40 MG PO CPDR: DELAYED_RELEASE_CAPSULE | ORAL | 3 refills | Status: DC

## 2019-06-01 NOTE — Addendum Note (Signed)
Addended by: Jasper Loser on: 06/01/2019 08:42 AM   Modules accepted: Orders

## 2019-06-01 NOTE — Addendum Note (Signed)
Addended by: Marin Olp on: 06/01/2019 02:54 PM   Modules accepted: Orders

## 2019-06-01 NOTE — Addendum Note (Signed)
Addended by: Francis Dowse T on: 06/01/2019 02:44 PM   Modules accepted: Orders

## 2019-06-01 NOTE — Progress Notes (Signed)
Phone: [phone]   Subjective:  Patient presents today for their annual physical. Chief complaint-noted.   See problem oriented charting- ROS- full  review of systems was completed and negative except for: cold intolerance, neck pain and stiffness, post nasal drip   The following were reviewed and entered/updated in epic: Past Medical History:  Diagnosis Date  . Aneurysm of ascending aorta ([facility])    a. 4.2-4.3cm in 2014.  Marland Kitchen Anxiety   . Breast cancer ([facility]) 2018   right mastectomy  . Cancer ([facility]) 1980   ovarian cancer  . Compression fracture of L1 lumbar vertebra ([facility])   . COMPRESSION FRACTURE, THORACIC VERTEBRA 07/24/2010   Qualifier: Diagnosis of  By: [name], [name]   . Elevated BP Transient 09/2014  . Genetic testing 07/09/2017   Multi-Cancer panel (83 genes) @ [facility] - No pathogenic mutations detected  . GERD (gastroesophageal reflux disease)   . H/O bone density study    2015  . H/O colonoscopy   . History of ovarian cancer    November 1979  . 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 Insomnia   . Normal coronary arteries Cath - 09/2014  . Osteoporosis   . Raynaud's disease    Patient Active Problem List   Diagnosis Date Noted  . history of Breast cancer of lower-outer quadrant of right female breast  01/16/2016    Priority: High  . Aneurysm of thoracic aorta ([place]) 05/06/2007    Priority: High  . History of ovarian cancer 09/22/1977    Priority: High  . Chronic cough 01/21/2018    Priority: Medium  . Normal coronary arteries 10/17/2014    Priority: Medium  . Esophageal reflux 05/06/2007    Priority: Medium  . Osteoporosis 03/18/2007    Priority: Medium  . Genetic testing 07/09/2017    Priority: Low  . Palpitations 11/13/2014    Priority: Low  . Varicose veins of lower extremities with other complications [id number]    Priority: Low  . Hx of herpes simplex infection 12/18/2011    Priority: Low   . Low back pain 06/28/2010    Priority: Low  . Generalized anxiety disorder 10/13/2008    Priority: Low  . Irritable bowel syndrome 10/13/2008    Priority: Low  . Lung nodule 11/25/2007    Priority: Low  . Raynaud's syndrome 07/01/2007    Priority: Low  . Allergic rhinitis 05/06/2007    Priority: Low  . DEGENERATIVE DISC DISEASE, CERVICAL SPINE 05/06/2007    Priority: Low  . Mild hyperlipidemia 06/01/2019  . Degenerative arthritis of knee, bilateral 04/08/2019  . Chronic idiopathic constipation 03/01/2019   Past Surgical History:  Procedure Laterality Date  . ABDOMINAL HYSTERECTOMY    . APPENDECTOMY    . BILATERAL SALPINGOOPHORECTOMY    . BREAST LUMPECTOMY    . LEFT HEART CATHETERIZATION WITH CORONARY ANGIOGRAM N/A 10/17/2014   Procedure: LEFT HEART CATHETERIZATION WITH CORONARY ANGIOGRAM;  Surgeon: [name], MD;  Location: [facility] CATH LAB;  Service: Cardiovascular;  Laterality: N/A;  . ovarian ca s/p taj/bso  1979  . SIMPLE MASTECTOMY WITH AXILLARY SENTINEL NODE BIOPSY Right 05/05/2017   Procedure: RIGHT TOTAL MASTECTOMY WITH RIGHT  AXILLARY SENTINEL NODE BIOPSY;  Surgeon: [name], MD;  Location: [facility];  Service: General;  Laterality: Right;  PECTORAL BLOCK    Family History  Problem Relation Age of Onset  . Hip fracture Mother   . Other Father  lived to 68  . Healthy Brother   . Breast cancer Cousin 24       mat first cousin related through uncle  . Esophageal cancer Cousin 56       mat first cousin related through uncle    Medications- reviewed and updated Current Outpatient Medications  Medication Sig Dispense Refill  . CALCIUM PO Take 1,000 mg by mouth.    Marland Kitchen CALCIUM-MAG-VIT C-VIT D PO Take 15 mLs by mouth daily.    Marland Kitchen esomeprazole (NEXIUM) 40 MG capsule TAKE 1 CAPSULE DAILY 90 capsule 3  . MAGNESIUM PO Take 300 mg by mouth.    . valACYclovir (VALTREX) 500 MG tablet Take 1 tablet (500 mg total) by mouth daily. 90 tablet 3  . Cholecalciferol  (VITAMIN D3) 1000 units CAPS Take 1,000 Units by mouth daily.    Marland Kitchen tiZANidine (ZANAFLEX) 2 MG tablet Take 1 tablet (2 mg total) by mouth at bedtime as needed for muscle spasms. 15 tablet 0   No current facility-administered medications for this visit.     Allergies-reviewed and updated Allergies  Allergen Reactions  . Chlorhexidine Gluconate Rash    Social History   Social History Narrative   Married. She and her husband are from the [place] area of [place]. Children: Treyonna Buquet, lives in [place], [place], lives in [place], [place]. 4 grandkids. No greatgrandkids.       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    Objective  Objective:  BP 108/70 (BP Location: Left Arm, Patient Position: Sitting, Cuff Size: Normal)   Pulse 68   Temp 97.7 F (36.5 C) (Temporal)   Ht 5\' 5"  (1.651 m)   Wt 142 lb 3.2 oz (64.5 kg)   LMP  (LMP Unknown)   SpO2 97%   BMI 23.66 kg/m  Gen: NAD, resting comfortably HEENT: Mucous membranes are moist. Oropharynx normal Neck: no thyromegaly or cervical lymphadenopathy  CV: RRR no murmurs rubs or gallops Lungs: CTAB no crackles, wheeze, rhonchi Abdomen: soft/nontender/nondistended/normal bowel sounds. No rebound or guarding.  Ext: no edema and 2+ PT pulses Skin: warm, dry Neuro: grossly normal, moves all extremities, PERRLA   Assessment and Plan   85 y.o. female presenting for annual physical.  Health Maintenance counseling: 1. Anticipatory guidance: Patient counseled regarding regular dental exams -q6 months, eye exams - yearly,  avoiding smoking and second hand smoke , limiting alcohol to 1 beverage per day- doesn't drink .   2. Risk factor reduction:  Advised patient of need for regular exercise and diet rich and fruits and vegetables to reduce risk of heart attack and stroke. Exercise- not exercising with neck- will get back to walking. Diet-reasonably  healthy.  Up 14 pounds from last year- she had lost weight at that point and I advised no further weight loss Wt Readings from Last 3 Encounters:  06/01/19 142 lb 3.2 oz (64.5 kg)  04/08/19 142 lb (64.4 kg)  02/18/19 137 lb (62.1 kg)  3. Immunizations/screenings/ancillary studies- high-dose flu shot today. Defers shingrix 1 year with covid 19  Immunization History  Administered Date(s) Administered  . Influenza Split 06/26/2011, 07/07/2012  . Influenza Whole 07/09/1999, 07/01/2007, 06/27/2008, 07/11/2009, 06/28/2010  . Influenza, High Dose Seasonal PF 07/13/2013, 06/26/2015, 07/15/2017, 06/21/2018  . Influenza,inj,Quad PF,6+ Mos 06/22/2014  . Influenza-Unspecified 08/15/2016  . Pneumococcal Polysaccharide-23 12/20/2012, 11/04/2018  . Td 09/23/2003  . Zoster 01/19/2013  4. Cervical cancer screening- past age to based screening. No vaginal bleeding.  5. Breast cancer screening-  Hx of Breast Cancer, R.  Continues to get mammograms yearly on left breast.  Follows with Dr. [name] 6. Colon cancer screening - denies ever having abnormal colonoscopy- passed age to based screening. Last was 2002, would have been 77 at follow up and she was told she graduated.  7. Skin cancer screening-follows with Dr. Delman Cheadle.advised regular sunscreen use. 8. Birth control/STD check-postmenopausal/monogamous 9. Osteoporosis screening at 60- patient's last bone density was October 2018 with osteopenia at -3.0 in lumbar spine.  Patient is compliant with calcium and vitamin D.  She took Fosamax for many yjears but developed "severe jaw damage" from Micro notes- we discussed possibly repeating bone density and considering alternate like Prolia- she wants to get bone density first   - will check vitamin D as she states she took a few months off vitamin d this summer -Never smoker  Status of chronic or acute concerns  Aneurysm of the thoracic aorta -she follows with Dr. Johnsie Cancel regularly.  Every other year does MRI/MRA  with cardiology-stable March 2020  GERD - Taking Nexium 40 mg every other day previously- had some breakthrough and cough on this- so increased to daily.   B12 levels have been adequate despite PPI Lab Results  Component Value Date   VITAMINB12 >1500 (H) 03/09/2017    Hx of Ovarian Cancer-age 62-no issues since that time  GAD-on medication years ago and has not recently needed  Herpes Simplex - Taking Valacyclovir.   Has had neck pain/neck stiffness since last week- very painful- hard to turn her neck as a result. Could not touch chin to chest but is now able to. Has taken advil and improving some. Hard time sleeping at night- unless benadryl and advil. For first few nights could not get head down on pillow. Has tried heat on it.  - try low dose tizanidine -follow up with Dr. Tamala Julian if not improving  Mild back discomfort- prefers to get UA  Recommended follow up: 6 months follow up   [place]   1. Preventative health care  Z00.00 CBC    Comprehensive metabolic panel    Lipid panel    VITAMIN D 25 Hydroxy (Vit-D Deficiency, Fractures)    Vitamin B12    POCT Urinalysis Dipstick (Automated)  2. Thoracic aortic aneurysm without rupture (HCC)  I71.2   3. Other osteoporosis, unspecified pathological fracture presence  M81.8 VITAMIN D 25 Hydroxy (Vit-D Deficiency, Fractures)    DG Bone Density  4. Gastroesophageal reflux disease without esophagitis  K21.9   5. Mild hyperlipidemia  E78.5 CBC    Comprehensive metabolic panel    Lipid panel    POCT Urinalysis Dipstick (Automated)  6. High risk medication use  Z79.899 Vitamin B12    Meds ordered this encounter  Medications  . esomeprazole (NEXIUM) 40 MG capsule    Sig: TAKE 1 CAPSULE DAILY    Dispense:  90 capsule    Refill:  3  . tiZANidine (ZANAFLEX) 2 MG tablet    Sig: Take 1 tablet (2 mg total) by mouth at bedtime as needed for muscle spasms.    Dispense:  15 tablet    Refill:  0      Return precautions advised.  Garret Reddish, MD

## 2019-06-01 NOTE — Patient Instructions (Addendum)
Health Maintenance Due  Topic Date Due  . INFLUENZA VACCINE - today 04/23/2019   Schedule your bone density test at check out desk for november. You may also call directly to X-ray at [phone] to schedule an appointment that is convenient for you.  - located [address] across the street from [place] - in the basement - you do need an appointment for the bone density tests.   Try tizanidine for the neck. Follow up with Dr. Tamala Julian if not improving in 10 days.   Please stop by lab before you go If you do not have mychart- we will call you about results within 5 business days of [place] receiving them.  If you have mychart- we will send your results within 3 business days of [place] receiving them.  If abnormal or we want to clarify a result, we will call or mychart you to make sure you receive the message.  If you have questions or concerns or don't hear within 5-7 days, please send [place] a message or call us.

## 2019-06-01 NOTE — Addendum Note (Signed)
Addended by: Francis Dowse T on: 06/01/2019 08:36 AM   Modules accepted: Orders

## 2019-07-19 ENCOUNTER — Ambulatory Visit: Payer: Medicare Other | Admitting: Allergy and Immunology

## 2019-08-03 ENCOUNTER — Ambulatory Visit (INDEPENDENT_AMBULATORY_CARE_PROVIDER_SITE_OTHER)
Admission: RE | Admit: 2019-08-03 | Discharge: 2019-08-03 | Disposition: A | Discharge status: Home / Self Care | Payer: Medicare Other | Source: Ambulatory Visit | Attending: Family Medicine | Admitting: Family Medicine

## 2019-08-03 ENCOUNTER — Other Ambulatory Visit: Payer: Self-pay

## 2019-08-03 DIAGNOSIS — M818 Other osteoporosis without current pathological fracture: Secondary | ICD-10-CM

## 2019-08-05 ENCOUNTER — Other Ambulatory Visit: Payer: Self-pay | Admitting: Physician Assistant

## 2019-08-05 DIAGNOSIS — R1032 Left lower quadrant pain: Secondary | ICD-10-CM

## 2019-08-05 DIAGNOSIS — R1030 Lower abdominal pain, unspecified: Secondary | ICD-10-CM

## 2019-08-05 DIAGNOSIS — Z8543 Personal history of malignant neoplasm of ovary: Secondary | ICD-10-CM

## 2019-08-05 DIAGNOSIS — Z853 Personal history of malignant neoplasm of breast: Secondary | ICD-10-CM

## 2019-08-05 LAB — BASIC METABOLIC PANEL
BUN: 16 (ref 4–21)
CO2: 30 — AB (ref 13–22)
Chloride: 99 (ref 99–108)
Creatinine: 0.7 (ref 0.5–1.1)
Glucose: 74
Potassium: 4.4 (ref 3.4–5.3)
Sodium: 135 — AB (ref 137–147)

## 2019-08-05 LAB — CBC AND DIFFERENTIAL
HCT: 40 (ref 36–46)
Hemoglobin: 13.3 (ref 12.0–16.0)
WBC: 5.4

## 2019-08-05 LAB — COMPREHENSIVE METABOLIC PANEL
Albumin: 4.2 (ref 3.5–5.0)
Calcium: 9.7 (ref 8.7–10.7)
GFR calc Af Amer: 105
GFR calc non Af Amer: 87

## 2019-08-05 LAB — FECAL OCCULT BLOOD, GUAIAC: Fecal Occult Blood: NEGATIVE

## 2019-08-05 LAB — HEPATIC FUNCTION PANEL
ALT: 11 (ref 7–35)
AST: 17 (ref 13–35)
Alkaline Phosphatase: 69 (ref 25–125)
Bilirubin, Total: 0.5

## 2019-08-05 LAB — CBC: RBC: 4.63 (ref 3.87–5.11)

## 2019-08-16 ENCOUNTER — Other Ambulatory Visit: Payer: Medicare Other

## 2019-08-17 ENCOUNTER — Ambulatory Visit
Admission: RE | Admit: 2019-08-17 | Discharge: 2019-08-17 | Disposition: A | Discharge status: Home / Self Care | Payer: Medicare Other | Source: Ambulatory Visit | Attending: Physician Assistant | Admitting: Physician Assistant

## 2019-08-17 DIAGNOSIS — R1032 Left lower quadrant pain: Secondary | ICD-10-CM

## 2019-08-17 DIAGNOSIS — R1030 Lower abdominal pain, unspecified: Secondary | ICD-10-CM

## 2019-08-17 DIAGNOSIS — Z853 Personal history of malignant neoplasm of breast: Secondary | ICD-10-CM

## 2019-08-17 DIAGNOSIS — Z8543 Personal history of malignant neoplasm of ovary: Secondary | ICD-10-CM

## 2019-08-17 MED ORDER — IOPAMIDOL (ISOVUE-300) INJECTION 61%
100.0000 mL | Freq: Once | INTRAVENOUS | Status: AC | PRN
  Administered 2019-08-17: 100 mL via INTRAVENOUS

## 2019-08-25 ENCOUNTER — Other Ambulatory Visit: Payer: Self-pay

## 2019-08-25 ENCOUNTER — Ambulatory Visit (INDEPENDENT_AMBULATORY_CARE_PROVIDER_SITE_OTHER): Payer: Medicare Other

## 2019-08-25 DIAGNOSIS — Z0000 Encounter for general adult medical examination without abnormal findings: Secondary | ICD-10-CM | POA: Diagnosis not present

## 2019-08-25 NOTE — Progress Notes (Addendum)
This visit is being conducted via phone call due to the COVID-19 pandemic. This patient has given me verbal consent via phone to conduct this visit, patient states they are participating from their home address. Some vital signs may be absent or patient reported.   Patient identification: identified by name, DOB, and current address.  Location provider: [facility], Office Persons participating in the virtual visit: Denman George LPN and Dr. Garret Reddish     Subjective:   Cindy Schwartz is a 85 y.o. female who presents for Medicare Annual (Subsequent) preventive examination.  Review of Systems:   Cardiac Risk Factors include: advanced age (>34men, >75 women)    Objective:     Vitals: LMP  (LMP Unknown)   There is no height or weight on file to calculate BMI.  Advanced Directives 08/25/2019 11/15/2018 06/25/2017 05/05/2017 04/27/2017 03/26/2017 02/23/2017  Does Patient Have a Medical Advance Directive? Yes No;Yes Yes Yes Yes Yes Yes  Type of Paramedic of [place];Living will [place];Living will;Out of facility DNR (pink MOST or yellow form) [place];Living will [place];Living will - - [place];Living will  Does patient want to make changes to medical advance directive? No - Patient declined - No - Patient declined No - Patient declined - - -  Copy of [place] in Chart? No - copy requested No - copy requested No - copy requested No - copy requested - - No - copy requested    Tobacco Social History   Tobacco Use  Smoking Status Never Smoker  Smokeless Tobacco Never Used     Counseling given: Not Answered   Clinical Intake:  Pre-visit preparation completed: Yes  Pain : No/denies pain  Diabetes: No  How often do you need to have someone help you when you read instructions, pamphlets, or other written materials from your doctor or pharmacy?: 1 - Never   Interpreter Needed?: No  Information entered by :: Denman George LPN  Past Medical History:  Diagnosis Date  . Aneurysm of ascending aorta ([place])    a. 4.2-4.3cm in 2014.  Marland Kitchen Anxiety   . Breast cancer ([name]) 2018   right mastectomy  . Cancer ([place]) 1980   ovarian cancer  . Compression fracture of L1 lumbar vertebra ([facility])   . COMPRESSION FRACTURE, THORACIC VERTEBRA 07/24/2010   Qualifier: Diagnosis of  By: [name], [name]   . Elevated BP Transient 09/2014  . Genetic testing 07/09/2017   Multi-Cancer panel (83 genes) @ [facility] - No pathogenic mutations detected  . GERD (gastroesophageal reflux disease)   . H/O bone density study    2015  . H/O colonoscopy   . History of ovarian cancer    November 1979  . 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 Insomnia   . Normal coronary arteries Cath - 09/2014  . Osteoporosis   . Raynaud's disease    Past Surgical History:  Procedure Laterality Date  . ABDOMINAL HYSTERECTOMY    . APPENDECTOMY    . BILATERAL SALPINGOOPHORECTOMY    . BREAST LUMPECTOMY    . LEFT HEART CATHETERIZATION WITH CORONARY ANGIOGRAM N/A 10/17/2014   Procedure: LEFT HEART CATHETERIZATION WITH CORONARY ANGIOGRAM;  Surgeon: [name], MD;  Location: [facility] CATH LAB;  Service: Cardiovascular;  Laterality: N/A;  . ovarian ca s/p taj/bso  1979  . SIMPLE MASTECTOMY WITH AXILLARY SENTINEL NODE BIOPSY Right 05/05/2017  Procedure: RIGHT TOTAL MASTECTOMY WITH RIGHT  AXILLARY SENTINEL NODE BIOPSY;  Surgeon: [name], MD;  Location: [facility];  Service: General;  Laterality: Right;  PECTORAL BLOCK   Family History  Problem Relation Age of Onset  . Hip fracture Mother   . Other Father        lived to 70  . Healthy Brother   . Breast cancer Cousin 68       mat first cousin related through uncle  . Esophageal cancer Cousin 35       mat first cousin related through uncle   Social History   Socioeconomic History   . Marital status: Married    Spouse name: Publishing copy  . Number of children: 2  . Years of education: Not on file  . Highest education level: Not on file  Occupational History  . Occupation: retired    Fish farm manager: RETIRED  Social Needs  . Financial resource strain: Not on file  . Food insecurity    Worry: Not on file    Inability: Not on file  . Transportation needs    Medical: Not on file    Non-medical: Not on file  Tobacco Use  . Smoking status: Never Smoker  . Smokeless tobacco: Never Used  Substance and Sexual Activity  . Alcohol use: No  . Drug use: No  . Sexual activity: Not on file  Lifestyle  . Physical activity    Days per week: Not on file    Minutes per session: Not on file  . Stress: Not on file  Relationships  . Social Herbalist on phone: Not on file    Gets together: Not on file    Attends religious service: Not on file    Active member of club or organization: Not on file    Attends meetings of clubs or organizations: Not on file    Relationship status: Not on file  Other Topics Concern  . Not on file  Social History Narrative   Married. She and her husband are from the [place] area of [place]. Children: Rees Obenour, lives in [place], [place], lives in [place], [place]. 4 grandkids. No greatgrandkids.       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     Outpatient Encounter Medications as of 08/25/2019  Medication Sig  . CALCIUM PO Take 1,000 mg by mouth.  Marland Kitchen CALCIUM-MAG-VIT C-VIT D PO Take 15 mLs by mouth daily.  . Cholecalciferol (VITAMIN D3) 1000 units CAPS Take 1,000 Units by mouth daily.  Marland Kitchen esomeprazole (NEXIUM) 40 MG capsule TAKE 1 CAPSULE DAILY  . lubiprostone (AMITIZA) 8 MCG capsule Take 8 mcg by mouth 2 (two) times daily with a meal.  . MAGNESIUM PO Take 300 mg by mouth.  Marland Kitchen tiZANidine (ZANAFLEX) 2 MG tablet Take 1 tablet (2 mg total) by mouth at  bedtime as needed for muscle spasms.  . valACYclovir (VALTREX) 500 MG tablet Take 1 tablet (500 mg total) by mouth daily.   No facility-administered encounter medications on file as of 08/25/2019.     Activities of Daily Living In your present state of health, do you have any difficulty performing the following activities: 08/25/2019  Hearing? N  Vision? N  Difficulty concentrating or making decisions? N  Walking or climbing stairs? N  Dressing or bathing? N  Doing errands, shopping? N  Preparing Food  and eating ? N  Using the Toilet? N  In the past six months, have you accidently leaked urine? N  Do you have problems with loss of bowel control? N  Managing your Medications? N  Managing your Finances? N  Housekeeping or managing your Housekeeping? N  Some recent data might be hidden    Patient Care Team: Marin Olp, MD as PCP - General (Family Medicine) [name], MD as PCP - Cardiology (Cardiology) Ronald Lobo, MD as Consulting Physician (Gastroenterology) Deliah Goody, PA-C as Physician Assistant (Physician Assistant) Neldon Mc Donnamarie Poag, MD as Consulting Physician (Allergy and Immunology) Shon Hough, MD as Consulting Physician (Ophthalmology)    Assessment:   This is a routine wellness examination for Katye.  Exercise Activities and Dietary recommendations Current Exercise Habits: Home exercise routine, Type of exercise: Other - see comments;walking(household tasks), Time (Minutes): 30, Frequency (Times/Week): 5, Weekly Exercise (Minutes/Week): 150, Intensity: Mild  Goals    . Patient Stated     Maintain independence- manage caregiver burden appropriately        Fall Risk Fall Risk  08/25/2019 06/01/2019 11/04/2018 01/21/2018 12/26/2015  Falls in the past year? 0 0 0 No No  Number falls in past yr: - 0 0 - -  Injury with Fall? 0 0 0 - -  Risk for fall due to : - - - - -  Follow up Falls evaluation completed;Education provided;Falls prevention discussed  - - - -   Is the patient's home free of loose throw rugs in walkways, pet beds, electrical cords, etc?   yes      Grab bars in the bathroom? yes      Handrails on the stairs?   yes      Adequate lighting?   yes  Depression Screen PHQ 2/9 Scores 08/25/2019 06/01/2019 11/04/2018 01/21/2018  PHQ - 2 Score 0 0 0 0      Cognitive Function- no cognitive concerns at this time  -Discussed with patient memory boosting techniques   Cognitive Testing  Alert? Yes         Normal Appearance? N/a  Oriented to person? Yes           Place? Yes  Time? Yes  Recall of three objects? Yes  Can perform simple calculations? Yes  Displays appropriate judgment? Yes  Can read the correct time from a watch face? Yes   Immunization History  Administered Date(s) Administered  . Fluad Quad(high Dose 65+) 06/01/2019  . Influenza Split 06/26/2011, 07/07/2012  . Influenza Whole 07/09/1999, 07/01/2007, 06/27/2008, 07/11/2009, 06/28/2010  . Influenza, High Dose Seasonal PF 07/13/2013, 06/26/2015, 07/15/2017, 06/21/2018  . Influenza,inj,Quad PF,6+ Mos 06/22/2014  . Influenza-Unspecified 08/15/2016  . Pneumococcal Polysaccharide-23 12/20/2012, 11/04/2018  . Td 09/23/2003  . Zoster 01/19/2013    Qualifies for Shingles Vaccine?Discussed and patient will check with pharmacy for coverage.  Patient education handout provided   Screening Tests Health Maintenance  Topic Date Due  . TETANUS/TDAP  07/09/2021 (Originally 09/22/2013)  . PNA vac Low Risk Adult (2 of 2 - PCV13) 11/05/2019  . INFLUENZA VACCINE  Completed  . DEXA SCAN  Completed    Cancer Screenings: Lung: Low Dose CT Chest recommended if Age 42-80 years, 30 pack-year currently smoking OR have quit w/in 15years. Patient does not qualify. Breast:  Up to date on Mammogram? Yes   Up to date of Bone Density/Dexa? Yes Colorectal: colonoscopy 07/28/12    Plan:  I have personally reviewed and addressed the Medicare Annual Wellness  questionnaire and have noted the  following in the patient's chart:  A. Medical and social history B. Use of alcohol, tobacco or illicit drugs  C. Current medications and supplements D. Functional ability and status E.  Nutritional status F.  Physical activity G. Advance directives H. List of other physicians I.  Hospitalizations, surgeries, and ER visits in previous 12 months J.  [place] such as hearing and vision if needed, cognitive and depression L. Referrals, records requested, and appointments- none   In addition, I have reviewed and discussed with patient certain preventive protocols, quality metrics, and best practice recommendations. A written personalized care plan for preventive services as well as general preventive health recommendations were provided to patient.   Signed,  Denman George, LPN  Nurse Health Advisor   Nurse Notes: Discussed with patient resources to assist her in the care of her husband.  She states at this time she is not in need of additional help.  But will contact in the future if this changes.  She was appreciative of information   I have reviewed and agree with note, evaluation, plan.   Garret Reddish, MD

## 2019-08-25 NOTE — Patient Instructions (Signed)
Cindy Schwartz , Thank you for taking time to come for your Medicare Wellness Visit. I appreciate your ongoing commitment to your health goals. Please review the following plan we discussed and let me know if I can assist you in the future.   Screening recommendations/referrals: Colorectal Screening: up to date; last colonoscopy11/6/13 Mammogram: up to date; last 05/17/19 Bone Density: up to date; last 08/03/19  Vision and Dental Exams: Recommended annual ophthalmology exams for early detection of glaucoma and other disorders of the eye Recommended annual dental exams for proper oral hygiene  Vaccinations: Influenza vaccine: completed 06/01/19 Pneumococcal vaccine: up to date; last 11/04/18 (next vaccine due 11/06/19 or after)  Tdap vaccine: recommended; Please call your insurance company to determine your out of pocket expense. You may also check with pharmacy and they can administer vaccine as well.  Shingles vaccine: Please call your insurance company to determine your out of pocket expense for the Shingrix vaccine. You may receive this vaccine at your local pharmacy. (see attached handout)   Advanced directives: Please bring a copy of your POA (Power of Attorney) and/or Living Will to your next appointment.  Goals: Recommend to drink at least 6-8 8oz glasses of water per day and consume a balanced diet rich in fresh fruits and vegetables.   Next appointment: Please schedule your Annual Wellness Visit with your Nurse Health Advisor in one year.  Preventive Care 83 Years and Older, Female Preventive care refers to lifestyle choices and visits with your health care provider that can promote health and wellness. What does preventive care include?  A yearly physical exam. This is also called an annual well check.  Dental exams once or twice a year.  Routine eye exams. Ask your health care provider how often you should have your eyes checked.  Personal lifestyle choices, including:  Daily  care of your teeth and gums.  Regular physical activity.  Eating a healthy diet.  Avoiding tobacco and drug use.  Limiting alcohol use.  Practicing safe sex.  Taking low-dose aspirin every day if recommended by your health care provider.  Taking vitamin and mineral supplements as recommended by your health care provider. What happens during an annual well check? The services and screenings done by your health care provider during your annual well check will depend on your age, overall health, lifestyle risk factors, and family history of disease. Counseling  Your health care provider may ask you questions about your:  Alcohol use.  Tobacco use.  Drug use.  Emotional well-being.  Home and relationship well-being.  Sexual activity.  Eating habits.  History of falls.  Memory and ability to understand (cognition).  Work and work Statistician.  Reproductive health. Screening  You may have the following tests or measurements:  Height, weight, and BMI.  Blood pressure.  Lipid and cholesterol levels. These may be checked every 5 years, or more frequently if you are over 28 years old.  Skin check.  Lung cancer screening. You may have this screening every year starting at age 66 if you have a 30-pack-year history of smoking and currently smoke or have quit within the past 15 years.  Fecal occult blood test (FOBT) of the stool. You may have this test every year starting at age 43.  Flexible sigmoidoscopy or colonoscopy. You may have a sigmoidoscopy every 5 years or a colonoscopy every 10 years starting at age 67.  Hepatitis C blood test.  Hepatitis B blood test.  Sexually transmitted disease (STD) testing.  Diabetes  screening. This is done by checking your blood sugar (glucose) after you have not eaten for a while (fasting). You may have this done every 1-3 years.  Bone density scan. This is done to screen for osteoporosis. You may have this done starting at age  83.  Mammogram. This may be done every 1-2 years. Talk to your health care provider about how often you should have regular mammograms. Talk with your health care provider about your test results, treatment options, and if necessary, the need for more tests. Vaccines  Your health care provider may recommend certain vaccines, such as:  Influenza vaccine. This is recommended every year.  Tetanus, diphtheria, and acellular pertussis (Tdap, Td) vaccine. You may need a Td booster every 10 years.  Zoster vaccine. You may need this after age 21.  Pneumococcal 13-valent conjugate (PCV13) vaccine. One dose is recommended after age 48.  Pneumococcal polysaccharide (PPSV23) vaccine. One dose is recommended after age 73. Talk to your health care provider about which screenings and vaccines you need and how often you need them. This information is not intended to replace advice given to you by your health care provider. Make sure you discuss any questions you have with your health care provider. Document Released: 10/05/2015 Document Revised: 05/28/2016 Document Reviewed: 07/10/2015 Elsevier Interactive Patient Education  2017 [place] Prevention in the Home Falls can cause injuries. They can happen to people of all ages. There are many things you can do to make your home safe and to help prevent falls. What can I do on the outside of my home?  Regularly fix the edges of walkways and driveways and fix any cracks.  Remove anything that might make you trip as you walk through a door, such as a raised step or threshold.  Trim any bushes or trees on the path to your home.  Use bright outdoor lighting.  Clear any walking paths of anything that might make someone trip, such as rocks or tools.  Regularly check to see if handrails are loose or broken. Make sure that both sides of any steps have handrails.  Any raised decks and porches should have guardrails on the edges.  Have any  leaves, snow, or ice cleared regularly.  Use sand or salt on walking paths during winter.  Clean up any spills in your garage right away. This includes oil or grease spills. What can I do in the bathroom?  Use night lights.  Install grab bars by the toilet and in the tub and shower. Do not use towel bars as grab bars.  Use non-skid mats or decals in the tub or shower.  If you need to sit down in the shower, use a plastic, non-slip stool.  Keep the floor dry. Clean up any water that spills on the floor as soon as it happens.  Remove soap buildup in the tub or shower regularly.  Attach bath mats securely with double-sided non-slip rug tape.  Do not have throw rugs and other things on the floor that can make you trip. What can I do in the bedroom?  Use night lights.  Make sure that you have a light by your bed that is easy to reach.  Do not use any sheets or blankets that are too big for your bed. They should not hang down onto the floor.  Have a firm chair that has side arms. You can use this for support while you get dressed.  Do not have throw rugs  and other things on the floor that can make you trip. What can I do in the kitchen?  Clean up any spills right away.  Avoid walking on wet floors.  Keep items that you use a lot in easy-to-reach places.  If you need to reach something above you, use a strong step stool that has a grab bar.  Keep electrical cords out of the way.  Do not use floor polish or wax that makes floors slippery. If you must use wax, use non-skid floor wax.  Do not have throw rugs and other things on the floor that can make you trip. What can I do with my stairs?  Do not leave any items on the stairs.  Make sure that there are handrails on both sides of the stairs and use them. Fix handrails that are broken or loose. Make sure that handrails are as long as the stairways.  Check any carpeting to make sure that it is firmly attached to the stairs.  Fix any carpet that is loose or worn.  Avoid having throw rugs at the top or bottom of the stairs. If you do have throw rugs, attach them to the floor with carpet tape.  Make sure that you have a light switch at the top of the stairs and the bottom of the stairs. If you do not have them, ask someone to add them for you. What else can I do to help prevent falls?  Wear shoes that:  Do not have high heels.  Have rubber bottoms.  Are comfortable and fit you well.  Are closed at the toe. Do not wear sandals.  If you use a stepladder:  Make sure that it is fully opened. Do not climb a closed stepladder.  Make sure that both sides of the stepladder are locked into place.  Ask someone to hold it for you, if possible.  Clearly mark and make sure that you can see:  Any grab bars or handrails.  First and last steps.  Where the edge of each step is.  Use tools that help you move around (mobility aids) if they are needed. These include:  Canes.  Walkers.  Scooters.  Crutches.  Turn on the lights when you go into a dark area. Replace any light bulbs as soon as they burn out.  Set up your furniture so you have a clear path. Avoid moving your furniture around.  If any of your floors are uneven, fix them.  If there are any pets around you, be aware of where they are.  Review your medicines with your doctor. Some medicines can make you feel dizzy. This can increase your chance of falling. Ask your doctor what other things that you can do to help prevent falls. This information is not intended to replace advice given to you by your health care provider. Make sure you discuss any questions you have with your health care provider. Document Released: 07/05/2009 Document Revised: 02/14/2016 Document Reviewed: 10/13/2014 Elsevier Interactive Patient Education  2017 [employer].

## 2019-08-30 ENCOUNTER — Ambulatory Visit (INDEPENDENT_AMBULATORY_CARE_PROVIDER_SITE_OTHER): Payer: Medicare Other | Admitting: Allergy and Immunology

## 2019-08-30 ENCOUNTER — Other Ambulatory Visit: Payer: Self-pay

## 2019-08-30 ENCOUNTER — Encounter: Payer: Self-pay | Admitting: Allergy and Immunology

## 2019-08-30 VITALS — BP 128/70 | HR 71 | Temp 97.2°F | Resp 16 | Ht 66.0 in | Wt 145.2 lb

## 2019-08-30 DIAGNOSIS — K219 Gastro-esophageal reflux disease without esophagitis: Secondary | ICD-10-CM | POA: Diagnosis not present

## 2019-08-30 DIAGNOSIS — J3089 Other allergic rhinitis: Secondary | ICD-10-CM

## 2019-08-30 MED ORDER — FAMOTIDINE 40 MG PO TABS: ORAL_TABLET | ORAL | 5 refills | Status: DC

## 2019-08-30 MED ORDER — DEXILANT 60 MG PO CPDR: DELAYED_RELEASE_CAPSULE | ORAL | 5 refills | Status: DC

## 2019-08-30 NOTE — Patient Instructions (Addendum)
  1.  Allergen avoidance measures?  2.  Treat and prevent LPR:   A. Decrease caffeine consumption  B. Dexilant 60 mg - 1 tablet in AM  C. Famotidine 40 mg - 1 tablet in PM  D. Replace throat clearing with swallowing maneuver  3.  Can continue Benadryl if needed  4.  Obtain Covid vaccine when available  5.  Review previous chest x-ray results  6.  Return to clinic in 4 weeks or earlier if problem

## 2019-08-30 NOTE — Progress Notes (Signed)
[place] [facility] [place]   NEW PATIENT NOTE  Referring Provider: No ref. provider found Primary Provider: Marin Olp, MD Date of office visit: 08/30/2019    Subjective:   Chief Complaint:  Cindy Schwartz (DOB: Feb 10, 1934) is a 85 y.o. female who presents to the clinic on 08/30/2019 with a chief complaint of Post nasal drip .  HPI: Cindy Schwartz presents to this clinic in evaluation of "allergies".  For several decades she has been bothered chronically with postnasal drip and feeling as though there is something in her throat and irritation in her throat along with throat clearing and coughing.  When she develops a coughing spell she develops a runny nose and may be some runny eyes.  If she is not coughing she has no nasal symptoms or eye symptoms at all.  She does not have any sneezing or nasal congestion or anosmia or headaches.  She does not have any associated shortness of breath or chest tightness or sputum production with her coughing.  She has no problems swallowing.  She has seen multiple physicians for this issue including Dr. Lucia Gaskins, ENT, last year or the year prior, who performed a rhinoscopy and told her that her throat looked okay.  She has been treated with multiple medications including nasal steroids and antihistamines for this issue which have not helped her.  She is use nasal ipratropium which has not helped her.  She takes Benadryl at nighttime which helps her sleep.  She has reflux disease of many decades duration.  She will regurgitate if she bends over.  She has been using Nexium initially 40 mg when she was younger and now 10 mg on a daily basis.  She drinks 1 large coffee that she nurtures throughout the day.  She does not consume any soda or chocolate or drink alcohol.  Past Medical History:  Diagnosis Date   Aneurysm of ascending aorta ([place])    a. 4.2-4.3cm in 2014.   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   Insomnia    Normal coronary arteries Cath - 09/2014   Osteoporosis    Raynaud's disease     Past Surgical History:  Procedure Laterality Date   ABDOMINAL HYSTERECTOMY     APPENDECTOMY     BILATERAL SALPINGOOPHORECTOMY     BREAST LUMPECTOMY     LEFT HEART CATHETERIZATION WITH CORONARY ANGIOGRAM N/A 10/17/2014   Procedure: LEFT HEART CATHETERIZATION WITH CORONARY ANGIOGRAM;  Surgeon: [name], MD;  Location[facility] CATH LAB;  Service: Cardiovascular;  Laterality: N/A;   ovarian ca s/p taj/bso  1979   SIMPLE MASTECTOMY WITH AXILLARY SENTINEL NODE BIOPSY Right 05/05/2017   Procedure: RIGHT TOTAL MASTECTOMY WITH RIGHT  AXILLARY SENTINEL NODE BIOPSY;  Surgeon: [name], MD;  Location: [place];  Service:  General;  Laterality: Right;  PECTORAL BLOCK    Allergies as of 08/30/2019      Reactions   Chlorhexidine Gluconate Rash      Medication List      CALCIUM PO Take 1,000 mg by mouth.   CALCIUM-MAG-VIT C-VIT D PO Take 15 mLs by mouth daily.   esomeprazole 40 MG capsule Commonly known as: NexIUM TAKE 1 CAPSULE DAILY   lubiprostone 8 MCG capsule Commonly known as: AMITIZA Take 8 mcg by mouth 2 (two) times daily with a meal.   MAGNESIUM PO Take 300 mg by mouth.   tiZANidine 2 MG tablet Commonly known as: ZANAFLEX Take 1 tablet (2 mg total) by mouth at bedtime as needed for muscle  spasms.   valACYclovir 500 MG tablet Commonly known as: VALTREX Take 1 tablet (500 mg total) by mouth daily.   Vitamin D3 25 MCG (1000 UT) Caps Take 1,000 Units by mouth daily.       Review of systems negative except as noted in HPI / PMHx or noted below:  Review of Systems  Constitutional: Negative.   HENT: Negative.   Eyes: Negative.   Respiratory: Negative.   Cardiovascular: Negative.   Gastrointestinal: Negative.   Genitourinary: Negative.   Musculoskeletal: Negative.   Skin: Negative.   Neurological: Negative.   Endo/Heme/Allergies: Negative.   Psychiatric/Behavioral: Negative.     Family History  Problem Relation Age of Onset   Hip fracture Mother    Other Father        lived to 11   Healthy Brother    Breast cancer Cousin 35       mat first cousin related through uncle   Esophageal cancer Cousin 41       mat first cousin related through uncle    Social History   Socioeconomic History   Marital status: Married    Spouse name: [employer] copy   Number of children: 2   Years of education: Not on file   Highest education level: Not on file  Occupational History   Occupation: retired    Fish farm manager: RETIRED  Scientist, product/process development strain: Not on file   Food insecurity    Worry: Not on file    Inability: Not on file   Transportation needs    Medical: Not on file    Non-medical: Not on file  Tobacco Use   Smoking status: Never Smoker   Smokeless tobacco: Never Used  Substance and Sexual Activity   Alcohol use: No   Drug use: No   Sexual activity: Not on file  Lifestyle   Physical activity    Days per week: Not on file    Minutes per session: Not on file   Stress: Not on file  Relationships   Social connections    Talks on phone: Not on file    Gets together: Not on file    Attends religious service: Not on file    Active member of club or organization: Not on file    Attends meetings of clubs or organizations: Not on  file    Relationship status: Not on file   Intimate partner violence    Fear of current or ex partner: Not on file    Emotionally abused: Not on file    Physically abused: Not on file    Forced sexual activity: Not on file  Other Topics Concern   Not on file  Social History Narrative   Married. She and her husband  are from the [place] area of [place]. Children: Sarem Sergio, lives in [place], [place], lives in [place], [place]. 4 grandkids. No greatgrandkids.       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     Environmental and Social history  Lives in a house with a dry environment, no animals located inside the household, no carpet in the bedroom, no plastic on the bed, no plastic on the pillow, no smoking ongoing with inside the household.  Objective:   Vitals:   08/30/19 0931  BP: 128/70  Pulse: 71  Resp: 16  Temp: (!) 97.2 F (36.2 C)  SpO2: 99%   Height: 5\' 6"  (167.6 cm) Weight: 145 lb 3.2 oz (65.9 kg)  Physical Exam Constitutional:      Appearance: She is not diaphoretic.  HENT:     Head: Normocephalic.     Right Ear: Tympanic membrane, ear canal and external ear normal.     Left Ear: Tympanic membrane, ear canal and external ear normal.     Nose: Nose normal. No mucosal edema or rhinorrhea.     Mouth/Throat:     Pharynx: Uvula midline. No oropharyngeal exudate.  Eyes:     Conjunctiva/sclera: Conjunctivae normal.  Neck:     Thyroid: No thyromegaly.     Trachea: Trachea normal. No tracheal tenderness or tracheal deviation.  Cardiovascular:     Rate and Rhythm: Normal rate and regular rhythm.     Heart sounds: Normal heart sounds, S1 normal and S2 normal. No murmur.  Pulmonary:     Effort: No respiratory distress.     Breath sounds: Normal breath sounds. No stridor. No wheezing or rales.  Lymphadenopathy:     Head:     Right side of head: No tonsillar adenopathy.      Left side of head: No tonsillar adenopathy.     Cervical: No cervical adenopathy.  Skin:    Findings: No erythema or rash.     Nails: There is no clubbing.   Neurological:     Mental Status: She is alert.     Diagnostics: Allergy skin tests were performed.  She did not demonstrate any hypersensitivity against a screening panel of aeroallergens or foods  Results of blood tests obtained 14 May 2018 identifies serum IgE 19 KU/L, no evidence of antigen specific IgE antibodies directed against a aero allergen profile, WBC 8.4, absolute eosinophil 300, absolute lymphocyte 2300, hemoglobin 13.6, platelet 250.  Results of a sinus CT scan obtained 09 Feb 2014 identified the following:  The visualized portions of the paranasal sinuses are clear. No fluid levels are seen. Frontal sinuses are small. 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 Nasal septum is midline. Visualized soft tissues are grossly unremarkable.  Assessment and Plan:    1. Other allergic rhinitis   2. LPRD (laryngopharyngeal reflux disease)     1.  Allergen avoidance measures?  2.  Treat and prevent LPR:   A. Decrease caffeine consumption  B. Dexilant 60 mg - 1 tablet in AM  C. Famotidine 40 mg - 1 tablet in PM  D. Replace throat clearing with swallowing maneuver  3.  Can continue Benadryl if needed  4.  Obtain Covid vaccine when available  5.  Review previous chest x-ray results  6.  Return to clinic in 4 weeks or earlier if problem  Ceria has history is very consistent  with LPR and we will treat her with the therapy noted above for the next 4 weeks before we pursue any further evaluation or treatment or investigation of other etiologic factors that may be contributing to her respiratory tract symptoms.  Allena Katz, MD Allergy / Immunology [place]

## 2019-08-31 ENCOUNTER — Encounter: Payer: Self-pay | Admitting: Allergy and Immunology

## 2019-10-11 ENCOUNTER — Ambulatory Visit: Payer: Medicare Other | Admitting: Allergy and Immunology

## 2019-10-14 ENCOUNTER — Ambulatory Visit: Payer: Medicare Other | Attending: Family Medicine

## 2019-10-14 DIAGNOSIS — Z23 Encounter for immunization: Secondary | ICD-10-CM | POA: Insufficient documentation

## 2019-10-14 NOTE — Progress Notes (Signed)
   Covid-19 Vaccination Clinic  Name:  Marlicia Eskola    MRN: [id number] DOB: [birth year]  10/14/2019  Ms. Kapur was observed post Covid-19 immunization for 15 minutes without incidence. She was provided with Vaccine Information Sheet and instruction to access the V-Safe system.   Ms. Maslak was instructed to call 911 with any severe reactions post vaccine: Marland Kitchen Difficulty breathing  . Swelling of your face and throat  . A fast heartbeat  . A bad rash all over your body  . Dizziness and weakness    Immunizations Administered    Name Date Dose VIS Date Route   Pfizer COVID-19 Vaccine 10/14/2019 12:43 PM 0.3 mL 09/02/2019 Intramuscular   Manufacturer: [place]   Lot: [id number]   [place]: [id number]

## 2019-11-05 ENCOUNTER — Ambulatory Visit: Payer: Medicare Other | Attending: Internal Medicine

## 2019-11-05 DIAGNOSIS — Z23 Encounter for immunization: Secondary | ICD-10-CM | POA: Insufficient documentation

## 2019-11-05 NOTE — Progress Notes (Signed)
   Covid-19 Vaccination Clinic  Name:  Cindy Schwartz    MRN: [id number] DOB: [birth year]  11/05/2019  [name] was observed post Covid-19 immunization for 15 minutes without incidence. She was provided with Vaccine Information Sheet and instruction to access the V-Safe system.   [name] was instructed to call 911 with any severe reactions post vaccine: Marland Kitchen Difficulty breathing  . Swelling of your face and throat  . A fast heartbeat  . A bad rash all over your body  . Dizziness and weakness    Immunizations Administered    Name Date Dose VIS Date Route   Pfizer COVID-19 Vaccine 11/05/2019 12:52 PM 0.3 mL 09/02/2019 Intramuscular   Manufacturer: [place]   Lot: [id number]   [place]: [id number]

## 2019-11-11 ENCOUNTER — Ambulatory Visit
Admission: RE | Admit: 2019-11-11 | Discharge: 2019-11-11 | Disposition: A | Discharge status: Home / Self Care | Payer: Medicare Other | Source: Ambulatory Visit | Attending: Physician Assistant | Admitting: Physician Assistant

## 2019-11-11 ENCOUNTER — Other Ambulatory Visit: Payer: Self-pay | Admitting: Physician Assistant

## 2019-11-11 DIAGNOSIS — R1084 Generalized abdominal pain: Secondary | ICD-10-CM

## 2019-11-11 DIAGNOSIS — K5909 Other constipation: Secondary | ICD-10-CM

## 2019-11-29 ENCOUNTER — Other Ambulatory Visit: Payer: Self-pay

## 2019-11-30 ENCOUNTER — Ambulatory Visit (INDEPENDENT_AMBULATORY_CARE_PROVIDER_SITE_OTHER): Payer: Medicare Other | Admitting: Family Medicine

## 2019-11-30 ENCOUNTER — Encounter: Payer: Self-pay | Admitting: Family Medicine

## 2019-11-30 VITALS — BP 120/82 | HR 72 | Temp 97.3°F | Ht 66.0 in | Wt 138.8 lb

## 2019-11-30 DIAGNOSIS — I712 Thoracic aortic aneurysm, without rupture, unspecified: Secondary | ICD-10-CM

## 2019-11-30 DIAGNOSIS — K219 Gastro-esophageal reflux disease without esophagitis: Secondary | ICD-10-CM

## 2019-11-30 DIAGNOSIS — E785 Hyperlipidemia, unspecified: Secondary | ICD-10-CM

## 2019-11-30 DIAGNOSIS — M818 Other osteoporosis without current pathological fracture: Secondary | ICD-10-CM

## 2019-11-30 NOTE — Progress Notes (Signed)
Phone [phone] In person visit   Subjective:   Cindy Schwartz is a 86 y.o. year old very pleasant female patient who presents for/with See problem oriented charting Chief Complaint  Patient presents with  . Follow-up    This visit occurred during the SARS-CoV-2 public health emergency.  Safety protocols were in place, including screening questions prior to the visit, additional usage of staff PPE, and extensive cleaning of exam room while observing appropriate contact time as indicated for disinfecting solutions.   Past Medical History-  Patient Active Problem List   Diagnosis Date Noted  . history of Breast cancer of lower-outer quadrant of right female breast  01/16/2016    Priority: High  . Lower abdominal pain 06/28/2010    Priority: High  . Aneurysm of thoracic aorta ([place]) 05/06/2007    Priority: High  . History of ovarian cancer 09/22/1977    Priority: High  . Mild hyperlipidemia 06/01/2019    Priority: Medium  . Chronic cough 01/21/2018    Priority: Medium  . Normal coronary arteries 10/17/2014    Priority: Medium  . Hx of herpes simplex infection 12/18/2011    Priority: Medium  . Esophageal reflux 05/06/2007    Priority: Medium  . Osteoporosis 03/18/2007    Priority: Medium  . Degenerative arthritis of knee, bilateral 04/08/2019    Priority: Low  . Chronic idiopathic constipation 03/01/2019    Priority: Low  . Genetic testing 07/09/2017    Priority: Low  . Palpitations 11/13/2014    Priority: Low  . Varicose veins of lower extremities with other complications [id number]    Priority: Low  . Low back pain 06/28/2010    Priority: Low  . Generalized anxiety disorder 10/13/2008    Priority: Low  . Irritable bowel syndrome 10/13/2008    Priority: Low  . Lung nodule 11/25/2007    Priority: Low  . Raynaud's syndrome 07/01/2007    Priority: Low  . Allergic rhinitis 05/06/2007    Priority: Low  . DEGENERATIVE DISC DISEASE, CERVICAL SPINE 05/06/2007   Priority: Low    Medications- reviewed and updated Current Outpatient Medications  Medication Sig Dispense Refill  . CALCIUM PO Take 1,000 mg by mouth.    Marland Kitchen CALCIUM-MAG-VIT C-VIT D PO Take 15 mLs by mouth daily.    . Cholecalciferol (VITAMIN D3) 1000 units CAPS Take 1,000 Units by mouth daily.    Marland Kitchen esomeprazole (NEXIUM) 40 MG capsule TAKE 1 CAPSULE DAILY 90 capsule 3  . MAGNESIUM PO Take 300 mg by mouth.    . valACYclovir (VALTREX) 500 MG tablet Take 1 tablet (500 mg total) by mouth daily. 90 tablet 3   No current facility-administered medications for this visit.     Objective:  BP 120/82   Pulse 72   Temp (!) 97.3 F (36.3 C)   Ht 5\' 6"  (1.676 m)   Wt 138 lb 12.8 oz (63 kg)   LMP  (LMP Unknown)   SpO2 97%   BMI 22.40 kg/m  Gen: NAD, resting comfortably CV: RRR no murmurs rubs or gallops Lungs: CTAB no crackles, wheeze, rhonchi Abdomen: soft/nontender/nondistended/normal bowel sounds. No rebound or guarding.  Ext: no edema Skin: warm, dry        Assessment and Plan    #%- Aneurysm of thoracic aorta S: Gets MRI/MRI with Dr. Allene Dillon every other year-last done December 06 2018 and stable at 4.2 cm acending aneurysm.  Blood pressure goal less than 130/80 -also Patient with normal coronary arteries in the past but  still follows with Dr. Johnsie Cancel every 6 months- palpitations with PVCs/PACs in the past A/P: Stable on last check- continue to monitor every other year. Will also see Dr. Johnsie Cancel later this year- she will call to schedule April visit - Blood pressure looks pretty good at 120/82- would prefer diastolic under but im concerned about orthostatic hypotension with medicine so opted out   #% Esophageal reflux/history of chronic cough- has seen Dr. Chase Caller in the past and follows with DR. Buccini S: Compliant with Nexium every other day. Lab Results  Component Value Date   [id number] 06/01/2019  A/P: she has been able to go 20mg  daily- b12 levels have been ok-  occasionally still needs 40mg  depending on what she eats. Continue current medicine for now. Not ideal for osteoporosis but has required.  -cough is better on nexium 20mg  daily- occasionally feels some congestion- sems to be worse with more allergies -no pain on exam. She states they wonder if issues are gas related- she does not tend to pass gas   #Osteoporosis S: Patient on calcium and vitamin D.  Took Fosamax for years.  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  DEXA October 2018 showed worst T score at -3.0 lumbar spine. On different machine in 07/2019 worst t score - 2.2 RFN and LFN -Nexium not ideal for osteoporosis- dont think we can stop  - active around the home on her feet but no regular exercise due to caregiver burden- caring for sick husband A/P: continue calcium and vitamin D- try to get some walking in if possible. Also will plan on repeating bone density 07/2021 or later perhaps 2023    #Generalized anxiety disorder S: Anxiety has been controlled without medication recently.  Took medication years ago A/P: some stress with covid but overall doing well- continue without medicine   # left lower abdominal pain- has been seeing Dr. Delma Officer (knows through her church) or his PA. 08/17/2019 had CT abdomen pelvis with Deliah Goody, PA- only findings were diverticulosis without diverticulitis- she continues to have left lower abdominal pain- has plan for colonoscopy and EGD.   % History of ovarian cancer at age 81- infiltrating well differentiated papillary serous cystadenoma. No evidence of recurrence even on recent CT scan.  -if continues to have abdominal pain and colonoscopy and EGD are reassiromg= we discussed following up with gynecology  #hyperlipidemia S: already eating a healthy diet- no processed food- worst thing she really eats is cheese. Exercise limited Lab Results  Component Value Date   CHOL 167 06/01/2019   HDL 60.90 06/01/2019    LDLCALC 95 06/01/2019   LDLDIRECT 127.5 12/20/2012   TRIG 51.0 06/01/2019   CHOLHDL 3 06/01/2019   A/P: very healthy diet- could increase exercise but limited by obligations at home. We will plan to repeat labs in 6 months  # herpes simplex S:patient has bene on long term valtrex- has tried off for 4-5 weeks with no issues  A/P: she still has some on hand- declines refill today but will let me know if she changes her mind   Recommended follow up: Return in about 6 months (around 06/01/2020) for follow up- or sooner if needed, physical or sooner if needed.   Lab/Order associations:   ICD-10-CM   1. Thoracic aortic aneurysm without rupture (HCC)  I71.2   2. Other osteoporosis, unspecified pathological fracture presence  M81.8   3. Gastroesophageal reflux disease without esophagitis  K21.9   4. Mild hyperlipidemia  E78.5     Return precautions advised.  Garret Reddish, MD

## 2019-11-30 NOTE — Patient Instructions (Addendum)
Glad we are starting to see a light at end of the tunnel with covid  Thrilled you had your vaccines  Hopefully coming months will continue to look brighter  You are going to do great with colonoscopy and endoscopy I believe. If no findings for your pain would be reasonable to see your gynecologist for their opinion  No changes today- except adding some walking if able but I know that's tough with all you have on your plate at home

## 2019-12-08 ENCOUNTER — Encounter: Payer: Self-pay | Admitting: Family Medicine

## 2019-12-08 LAB — HM COLONOSCOPY

## 2019-12-08 NOTE — Progress Notes (Deleted)
Phone: [phone]   Subjective:  Patient presents today for their annual physical. Chief complaint-noted.   See problem oriented charting- ROS- full  review of systems was completed and negative except for: ***  The following were reviewed and entered/updated in epic: Past Medical History:  Diagnosis Date  . Aneurysm of ascending aorta ([place])    a. 4.2-4.3cm in 2014.  Marland Kitchen Anxiety   . Breast cancer ([place]) 2018   right mastectomy  . Cancer ([place]) 1980   ovarian cancer  . Compression fracture of L1 lumbar vertebra ([facility])   . COMPRESSION FRACTURE, THORACIC VERTEBRA 07/24/2010   Qualifier: Diagnosis of  By: [name], [name]   . Elevated BP Transient 09/2014  . Genetic testing 07/09/2017   Multi-Cancer panel (83 genes) @ [facility] - No pathogenic mutations detected  . GERD (gastroesophageal reflux disease)   . H/O bone density study    2015  . H/O colonoscopy   . History of ovarian cancer    November 1979  . 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 Insomnia   . Normal coronary arteries Cath - 09/2014  . Osteoporosis   . Raynaud's disease    Patient Active Problem List   Diagnosis Date Noted  . Mild hyperlipidemia 06/01/2019  . Degenerative arthritis of knee, bilateral 04/08/2019  . Chronic idiopathic constipation 03/01/2019  . Chronic cough 01/21/2018  . Genetic testing 07/09/2017  . history of Breast cancer of lower-outer quadrant of right female breast  01/16/2016  . Palpitations 11/13/2014  . Normal coronary arteries 10/17/2014  . Varicose veins of lower extremities with other complications 70/92/9574  . Hx of herpes simplex infection 12/18/2011  . Low back pain 06/28/2010  . Lower abdominal pain 06/28/2010  . Generalized anxiety disorder 10/13/2008  . Irritable bowel syndrome 10/13/2008  . Lung nodule 11/25/2007  . Raynaud's syndrome 07/01/2007  . Aneurysm of thoracic aorta ([place]) 05/06/2007  . Allergic rhinitis  05/06/2007  . Esophageal reflux 05/06/2007  . DEGENERATIVE DISC DISEASE, CERVICAL SPINE 05/06/2007  . Osteoporosis 03/18/2007  . History of ovarian cancer 09/22/1977   Past Surgical History:  Procedure Laterality Date  . ABDOMINAL HYSTERECTOMY    . APPENDECTOMY    . BILATERAL SALPINGOOPHORECTOMY    . BREAST LUMPECTOMY    . LEFT HEART CATHETERIZATION WITH CORONARY ANGIOGRAM N/A 10/17/2014   Procedure: LEFT HEART CATHETERIZATION WITH CORONARY ANGIOGRAM;  Surgeon: [name], MD;  Location: [facility] C[facility] LAB;  Service: Cardiovascular;  Laterality: N/A;  . ovarian ca s/p taj/bso  1979  . SIMPLE MASTECTOMY WITH AXILLARY SENTINEL NODE BIOPSY Right 05/05/2017   Procedure: RIGHT TOTAL MASTECTOMY WITH RIGHT  AXILLARY SENTINEL NODE BIOPSY;  Surgeon: [name], MD;  Location: [place];  Service: General;  Laterality: Right;  PECTORAL BLOCK    Family History  Problem Relation Age of Onset  . Hip fracture Mother   . Other Father        lived to 33  . Healthy Brother   . Breast cancer Cousin 9       mat first cousin related through uncle  . Esophageal cancer Cousin 36       mat first cousin related through uncle    Medications- reviewed and updated Current Outpatient Medications  Medication Sig Dispense Refill  . CALCIUM PO Take 1,000 mg by mouth.    Marland Kitchen CALCIUM-MAG-VIT C-VIT D PO Take 15 mLs by mouth daily.    . Cholecalciferol (VITAMIN D3) 1000  units CAPS Take 1,000 Units by mouth daily.    Marland Kitchen esomeprazole (NEXIUM) 40 MG capsule TAKE 1 CAPSULE DAILY 90 capsule 3  . MAGNESIUM PO Take 300 mg by mouth.    . valACYclovir (VALTREX) 500 MG tablet Take 1 tablet (500 mg total) by mouth daily. 90 tablet 3   No current facility-administered medications for this visit.    Allergies-reviewed and updated Allergies  Allergen Reactions  . Chlorhexidine Gluconate Rash    Social History   Social History Narrative   Married. She and her husband are from the [place] area of [place]. Children:  Ashea Winiarski, lives in [place], [place]ma, lives in [place], [place]. 4 grandkids. No greatgrandkids.       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    Objective  Objective:  LMP  (LMP Unknown)  Gen: NAD, resting comfortably HEENT: Mucous membranes are moist. Oropharynx normal Neck: no thyromegaly CV: RRR no murmurs rubs or gallops Lungs: CTAB no crackles, wheeze, rhonchi Abdomen: soft/nontender/nondistended/normal bowel sounds. No rebound or guarding.  Ext: no edema Skin: warm, dry Neuro: grossly normal, moves all extremities, PERRLA***   Assessment and Plan   86 y.o. female presenting for annual physical.  Health Maintenance counseling: 1. Anticipatory guidance: Patient counseled regarding regular dental exams ***q6 months, eye exams ***,  avoiding smoking and second hand smoke*** , limiting alcohol to 1 beverage per day*** .   2. Risk factor reduction:  Advised patient of need for regular exercise and diet rich and fruits and vegetables to reduce risk of heart attack and stroke. Exercise- ***. Diet-***.  Wt Readings from Last 3 Encounters:  11/30/19 138 lb 12.8 oz (63 kg)  08/30/19 145 lb 3.2 oz (65.9 kg)  06/01/19 142 lb 3.2 oz (64.5 kg)   3. Immunizations/screenings/ancillary studies Immunization History  Administered Date(s) Administered  . Fluad Quad(high Dose 65+) 06/01/2019  . Influenza Split 06/26/2011, 07/07/2012  . Influenza Whole 07/09/1999, 07/01/2007, 06/27/2008, 07/11/2009, 06/28/2010  . Influenza, High Dose Seasonal PF 07/13/2013, 06/26/2015, 07/15/2017, 06/21/2018  . Influenza,inj,Quad PF,6+ Mos 06/22/2014  . Influenza-Unspecified 08/15/2016  . PFIZER SARS-COV-2 Vaccination 10/14/2019, 11/05/2019  . Pneumococcal Polysaccharide-23 12/20/2012, 11/04/2018  . Td 09/23/2003  . Zoster 01/19/2013   There are no preventive care reminders to display for this patient. 4. Cervical  cancer screening- *** 5. Breast cancer screening-  breast exam *** and mammogram *** 6. Colon cancer screening - *** 7. Skin cancer screening- ***advised regular sunscreen use. Denies worrisome, changing, or new skin lesions.  8. Birth control/STD check- *** 9. Osteoporosis screening at 63- *** -*** smoker  Status of chronic or acute concerns   10/29/18- Pt called and informed of Zilretta benefits - $0 responsibility once deductible has been met and $198 deductible has been met.  Pt states that she wants to hold off on getting the Zilretta injection for the moment. - MW  ***08/25/2019 awv.  ***06/01/2019 cpe  #%- Aneurysm of thoracic aorta S: Gets MRI/MRI with Dr. Allene Dillon every other year-scheduled in March for this.  Blood pressure goal less than 130/80 -Patient with normal coronary arteries in the past but still follows with Dr. Johnsie Cancel every 6 months- palpitations with PVCs/PACs in the past A/P: ***    #% Esophageal reflux/history of chronic cough- has seen Dr. Chase Caller in the past S: Compliant with Nexium every other day. A/P: ***    #Osteoporosis S:  Patient on calcium and vitamin D.  Took Fosamax for years.  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  DEXA October 2018 showed worst T score at -3.0 lumbar spine. On different machine in 07/2019 worst t score - 2.2 RFN and LFN -Nexium not ideal for osteoporosis***  A/P: ***    #Generalized anxiety disorder S: Anxiety has been controlled without medication recently.  Took medication years ago A/P: ***   % History of ovarian cancer at age 80- infiltrating well differentiated papillary serous cystadenoma ***  *** No diagnosis found.  Recommended follow up: ***No follow-ups on file. Future Appointments  Date Time Provider [place]  06/11/2020  8:20 AM Marin Olp, MD [facility]    No chief complaint on file.  Lab/Order associations:*** fasting No diagnosis found.  No  orders of the defined types were placed in this encounter.   Return precautions advised.  Thomes Cake, [place]

## 2019-12-30 ENCOUNTER — Encounter: Payer: Self-pay | Admitting: Family Medicine

## 2019-12-30 DIAGNOSIS — Z86010 Personal history of colonic polyps: Secondary | ICD-10-CM | POA: Insufficient documentation

## 2020-01-04 ENCOUNTER — Telehealth: Payer: Self-pay | Admitting: Cardiovascular Disease

## 2020-01-04 NOTE — Telephone Encounter (Signed)
Patient called about waiting list appt.

## 2020-01-11 ENCOUNTER — Ambulatory Visit (INDEPENDENT_AMBULATORY_CARE_PROVIDER_SITE_OTHER): Payer: Medicare Other | Admitting: Orthopaedic Surgery

## 2020-01-11 ENCOUNTER — Encounter: Payer: Self-pay | Admitting: Orthopaedic Surgery

## 2020-01-11 ENCOUNTER — Other Ambulatory Visit: Payer: Self-pay

## 2020-01-11 ENCOUNTER — Ambulatory Visit: Payer: Self-pay

## 2020-01-11 VITALS — Ht 65.0 in | Wt 136.0 lb

## 2020-01-11 DIAGNOSIS — M25562 Pain in left knee: Secondary | ICD-10-CM

## 2020-01-11 MED ORDER — LIDOCAINE HCL 1 % IJ SOLN
2.0000 mL | INTRAMUSCULAR | Status: AC | PRN
  Administered 2020-01-11: 20:00:00 2 mL

## 2020-01-11 MED ORDER — BUPIVACAINE HCL 0.5 % IJ SOLN
2.0000 mL | INTRAMUSCULAR | Status: AC | PRN
  Administered 2020-01-11: 20:00:00 2 mL via INTRA_ARTICULAR

## 2020-01-11 MED ORDER — METHYLPREDNISOLONE ACETATE 40 MG/ML IJ SUSP
40.0000 mg | INTRAMUSCULAR | Status: AC | PRN
  Administered 2020-01-11: 20:00:00 40 mg via INTRA_ARTICULAR

## 2020-01-11 NOTE — Progress Notes (Signed)
Office Visit Note   Patient: Cindy Schwartz           Date of Birth: May 07, 1934           MRN: [id number] Visit Date: 01/11/2020              Requested by: Marin Olp, MD [place],  [place] [address] PCP: Marin Olp, MD   Assessment & Plan: Visit Diagnoses:  1. Acute pain of left knee     Plan: Impression is left knee pain due to OA and possibly a resolved pseudogout flareup.  Based on discussion of treatment options patient is interested in a cortisone injection which we performed today.  She will follow up with [place] if she does not experience any improvement.  Otherwise she will follow-up as needed.  Follow-Up Instructions: Return if symptoms worsen or fail to improve.   Orders:  Orders Placed This Encounter  Procedures  . XR KNEE 3 VIEW LEFT   No orders of the defined types were placed in this encounter.     Procedures: Large Joint Inj: L knee on 01/11/2020 7:53 PM Details: 22 G needle Medications: 2 mL bupivacaine 0.5 %; 2 mL lidocaine 1 %; 40 mg methylPREDNISolone acetate 40 MG/ML Outcome: tolerated well, no immediate complications Patient was prepped and draped in the usual sterile fashion.       Clinical Data: No additional findings.   Subjective: Chief Complaint  Patient presents with  . Left Knee - Pain    Cindy Schwartz is a very pleasant 86 year old female who comes in for evaluation of a painful left knee that began suddenly Friday morning.  She denies any injuries.  She states that the pain was posterior at times.  She had trouble sitting down.  She states that it is slightly better since Friday.  Denies any numbness and tingling.  Denies any mechanical symptoms.  Denies history of gout.  Denies any constitutional symptoms.   Review of Systems  Constitutional: Negative.   HENT: Negative.   Eyes: Negative.   Respiratory: Negative.   Cardiovascular: Negative.   Endocrine: Negative.   Musculoskeletal: Negative.   Neurological:  Negative.   Hematological: Negative.   Psychiatric/Behavioral: Negative.   All other systems reviewed and are negative.    Objective: Vital Signs: Ht 5\' 5"  (1.651 m)   Wt 136 lb (61.7 kg)   LMP  (LMP Unknown)   BMI 22.63 kg/m   Physical Exam Vitals and nursing note reviewed.  Constitutional:      Appearance: She is well-developed.  HENT:     Head: Normocephalic and atraumatic.  Pulmonary:     Effort: Pulmonary effort is normal.  Abdominal:     Palpations: Abdomen is soft.  Musculoskeletal:     Cervical back: Neck supple.  Skin:    General: Skin is warm.     Capillary Refill: Capillary refill takes less than 2 seconds.  Neurological:     Mental Status: She is alert and oriented to person, place, and time.  Psychiatric:        Behavior: Behavior normal.        Thought Content: Thought content normal.        Judgment: Judgment normal.     Ortho Exam Left knee shows no joint effusion.  Mild pain with range of motion.  Collaterals and cruciates are stable.  No joint line tenderness. Specialty Comments:  No specialty comments available.  Imaging: XR KNEE 3 VIEW LEFT  Result Date: 01/11/2020 Moderate tricompartmental osteoarthritis with chondrocalcinosis    PMFS History: Patient Active Problem List   Diagnosis Date Noted  . History of adenomatous polyp of colon 12/30/2019  . Mild hyperlipidemia 06/01/2019  . Degenerative arthritis of knee, bilateral 04/08/2019  . Chronic idiopathic constipation 03/01/2019  . Chronic cough 01/21/2018  . Genetic testing 07/09/2017  . history of Breast cancer of lower-outer quadrant of right female breast  01/16/2016  . Palpitations 11/13/2014  . Normal coronary arteries 10/17/2014  . Varicose veins of lower extremities with other complications [id number]  . Hx of herpes simplex infection 12/18/2011  . Low back pain 06/28/2010  . Lower abdominal pain 06/28/2010  . Generalized anxiety disorder 10/13/2008  . Irritable bowel  syndrome 10/13/2008  . Lung nodule 11/25/2007  . Raynaud's syndrome 07/01/2007  . Aneurysm of thoracic aorta ([place]) 05/06/2007  . Allergic rhinitis 05/06/2007  . Esophageal reflux 05/06/2007  . DEGENERATIVE DISC DISEASE, CERVICAL SPINE 05/06/2007  . Osteoporosis 03/18/2007  . History of ovarian cancer 09/22/1977   Past Medical History:  Diagnosis Date  . Aneurysm of ascending aorta ([place])    a. 4.2-4.3cm in 2014.  Marland Kitchen Anxiety   . Arthritis   . Breast cancer ([place]) 2018   right mastectomy  . Cancer ([place]) 1980   ovarian cancer  . Compression fracture of L1 lumbar vertebra ([facility])   . COMPRESSION FRACTURE, THORACIC VERTEBRA 07/24/2010   Qualifier: Diagnosis of  By: [name], [name]   . Elevated BP Transient 09/2014  . Genetic testing 07/09/2017   Multi-Cancer panel (83 genes) @ [facility] - No pathogenic mutations detected  . GERD (gastroesophageal reflux disease)   . H/O bone density study    2015  . H/O colonoscopy   . History of ovarian cancer    November 1979  . 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 Insomnia   . Irregular heart beat   . Normal coronary arteries Cath - 09/2014  . Osteoporosis   . Raynaud's disease     Family History  Problem Relation Age of Onset  . Hip fracture Mother   . Other Father        lived to 26  . Healthy Brother   . Breast cancer Cousin 67       mat first cousin related through uncle  . Esophageal cancer Cousin 44       mat first cousin related through uncle    Past Surgical History:  Procedure Laterality Date  . ABDOMINAL HYSTERECTOMY  1979  . APPENDECTOMY    . BILATERAL SALPINGOOPHORECTOMY    . BREAST LUMPECTOMY    . COLONOSCOPY  02/2012  . LEFT HEART CATHETERIZATION WITH CORONARY ANGIOGRAM N/A 10/17/2014   Procedure: LEFT HEART CATHETERIZATION WITH CORONARY ANGIOGRAM;  Surgeon: [name], MD;  [facility];  Service: Cardiovascular;  Laterality: N/A;  . ovarian ca  s/p taj/bso  1979  . SIMPLE MASTECTOMY WITH AXILLARY SENTINEL NODE BIOPSY Right 05/05/2017   Procedure: RIGHT TOTAL MASTECTOMY WITH RIGHT  AXILLARY SENTINEL NODE BIOPSY;  Surgeon: [name], MD;  Location: [place];  Service: General;  Laterality: Right;  PECTORAL BLOCK   Social History   Occupational History  . Occupation: retired    Fish farm manager: RETIRED  Tobacco Use  . Smoking status: Never Smoker  . Smokeless tobacco: Never Used  Substance and Sexual Activity  . Alcohol use: No  . Drug use: No  . Sexual  activity: Not on file

## 2020-02-08 ENCOUNTER — Other Ambulatory Visit: Payer: Self-pay

## 2020-02-08 ENCOUNTER — Ambulatory Visit (INDEPENDENT_AMBULATORY_CARE_PROVIDER_SITE_OTHER): Payer: Medicare Other | Admitting: Cardiovascular Disease

## 2020-02-08 ENCOUNTER — Encounter: Payer: Self-pay | Admitting: Cardiovascular Disease

## 2020-02-08 VITALS — BP 130/86 | HR 71 | Ht 65.0 in | Wt 141.0 lb

## 2020-02-08 DIAGNOSIS — R011 Cardiac murmur, unspecified: Secondary | ICD-10-CM

## 2020-02-08 NOTE — Progress Notes (Signed)
Evaluation Performed:  Follow-up visit  Date:  02/08/2020   ID:  Cindy Schwartz, DOB [birth year], MRN [id number]  PCP:  Marin Olp, MD  Cardiologist:  Jenkins Rouge, MD   Electrophysiologist:  None   Chief Complaint:  Palpitations/ Dizziness  History of Present Illness:    86 y.o. with history of HTN, fusiform aortic root dilatation 4.1 cm.  Chest pain January 2016 with normal cardiac catheterization. Palpitations with monitor only showing PACs;/PVC;s  Breast cancer with mastectomy August 2018. Husband diagnosed with lung cancer at same time.   Has two grandson's one [name] married living in [place] and one [name] has been in [place] and [place] cooking with wife  Dr Yong Channel ordered TTE 01/25/18 for her chronic cough and it was normal with EF 60-65% no valve disease reviewed  Seen by [facility] on 06/21/18 She is not on ACE and had normal PFTls And normal walk test with no desats.   She has had recurrent issues with cough/sinusitis seen by primary and has been on doxycycline and atrovent for some wheezing Shingles on right buttocks improved after Rx Valtrex  Husband being Rx for pulmonary fibrosis cannot do much She saw ortho 01/11/20 for acute left knee pain ? Pseudogout vs OA injected with bupivacaine and steroid  She is teary eyed about her own limitations and lack of visits from her family/son  The patient does not have symptoms concerning for COVID-19 infection (fever, chills, cough, or new shortness of breath).    Past Medical History:  Diagnosis Date  . Aneurysm of ascending aorta ([place])    a. 4.2-4.3cm in 2014.  Marland Kitchen Anxiety   . Arthritis   . Breast cancer ([place]) 2018   right mastectomy  . Cancer ([place]) 1980   ovarian cancer  . Compression fracture of L1 lumbar vertebra ([facility])   . COMPRESSION FRACTURE, THORACIC VERTEBRA 07/24/2010   Qualifier: Diagnosis of  By: [name], [name]   . Elevated BP Transient 09/2014  . Genetic testing 07/09/2017    Multi-Cancer panel (83 genes) @ [facility] - No pathogenic mutations detected  . GERD (gastroesophageal reflux disease)   . H/O bone density study    2015  . H/O colonoscopy   . History of ovarian cancer    November 1979  . 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 Insomnia   . Irregular heart beat   . Normal coronary arteries Cath - 09/2014  . Osteoporosis   . Raynaud's disease    Past Surgical History:  Procedure Laterality Date  . ABDOMINAL HYSTERECTOMY  1979  . APPENDECTOMY    . BILATERAL SALPINGOOPHORECTOMY    . BREAST LUMPECTOMY    . COLONOSCOPY  02/2012  . LEFT HEART CATHETERIZATION WITH CORONARY ANGIOGRAM N/A 10/17/2014   Procedure: LEFT HEART CATHETERIZATION WITH CORONARY ANGIOGRAM;  Surgeon: [name], MD;  Location: [facility] CATH LAB;  Service: Cardiovascular;  Laterality: N/A;  . ovarian ca s/p taj/bso  1979  . SIMPLE MASTECTOMY WITH AXILLARY SENTINEL NODE BIOPSY Right 05/05/2017   Procedure: RIGHT TOTAL MASTECTOMY WITH RIGHT  AXILLARY SENTINEL NODE BIOPSY;  Surgeon: [name], MD;  Location: [place];  Service: General;  Laterality: Right;  PECTORAL BLOCK     Current Meds  Medication Sig  . Ascorbic Acid (VITAMIN C) 500 MG CAPS Take 1 tablet by mouth daily.  Marland Kitchen CALCIUM PO Take 1,000 mg by mouth.  Marland Kitchen CALCIUM-MAG-VIT C-VIT D PO Take 15 mLs  by mouth daily.  . Cholecalciferol (VITAMIN D3) 1000 units CAPS Take 1,000 Units by mouth daily.  Marland Kitchen esomeprazole (NEXIUM) 20 MG capsule Take 20 mg by mouth daily at 12 noon.  Marland Kitchen MAGNESIUM PO Take 300 mg by mouth.  . valACYclovir (VALTREX) 500 MG tablet Take 1 tablet (500 mg total) by mouth daily. (Patient taking differently: Take 1,000 mg by mouth as needed. )     Allergies:   Chlorhexidine gluconate   Social History   Tobacco Use  . Smoking status: Never Smoker  . Smokeless tobacco: Never Used  Substance Use Topics  . Alcohol use: No  . Drug use: No     Family  Hx: The patient's family history includes Breast cancer (age of onset: 69) in her cousin; Esophageal cancer (age of onset: 2) in her cousin; Healthy in her brother; Hip fracture in her mother; Other in her father.  ROS:   Please see the history of present illness.     All other systems reviewed and are negative.   Prior CV studies:   The following studies were reviewed today:  Echo 01/25/18 Holter 09/25/15   Labs/Other Tests and Data Reviewed:    EKG  SR RBBB no acute changes   Recent Labs: 06/01/2019: Platelets 239.0 08/05/2019: ALT 11; BUN 16; Creatinine 0.7; Hemoglobin 13.3; Potassium 4.4; Sodium 135   Recent Lipid Panel Lab Results  Component Value Date/Time   CHOL 167 06/01/2019 08:36 AM   TRIG 51.0 06/01/2019 08:36 AM   HDL 60.90 06/01/2019 08:36 AM   CHOLHDL 3 06/01/2019 08:36 AM   LDLCALC 95 06/01/2019 08:36 AM   LDLDIRECT 127.5 12/20/2012 09:05 AM    Wt Readings from Last 3 Encounters:  02/08/20 141 lb (64 kg)  01/11/20 136 lb (61.7 kg)  11/30/19 138 lb 12.8 oz (63 kg)     Objective:    Vital Signs:  BP 130/86   Pulse 71   Ht 5\' 5"  (1.651 m)   Wt 141 lb (64 kg)   LMP  (LMP Unknown)   SpO2 99%   BMI 23.46 kg/m    Depressed  Well nourished, well developed female in no acute distress. Skin warm and dry No tachypnea Post right mastectomy No JVP elevation No edema 2/6 SEM  Plus 3 PT/DP  ASSESSMENT & PLAN:     1. Dizziness/ Lightheadedness: non cardiac benign holter 09/25/15 normal exam and no CAD at cath 2016   2. H/o PVCs/PAC: benign asymptomatic for most part PRN Inderal   3. Breast CA:  Post right mastectomy [name] 05/05/17 F/U oncology   4. Stress: related to aging and husbands diagnosis of lung cancer Improving   5. Thoracic aortic aneurysm: followed by Dr. [name]. MRA 12/10/16 Aorta 4.1 cm observe  6. Murmur:  Benign aortic sclerosis murmur TTE 01/25/18 no significant valve disease EF 60-65%    7. Cholesterol:  LDL 113 at her age  with no CAD or bad vascular disease would not Rx with statin Which is also her preference   8. Cough:  Cyclic sees pulmonary continue flonase and anti reflux measures atrovent was added Suspect pollen and allergies making things worse currently F/U Dr Yong Channel  COVID-19 Education: The signs and symptoms of COVID-19 were discussed with the patient and how to seek care for testing (follow up with PCP or arrange E-visit).  The importance of social distancing was discussed today.  Time:   Today, I have spent 60minutes with the patient with telehealth technology discussing the  above problems.      Medication Adjustments/Labs and Tests Ordered: Current medicines are reviewed at length with the patient today.  Concerns regarding medicines are outlined above.  Tests Ordered: No orders of the defined types were placed in this encounter.   Medication Changes: No orders of the defined types were placed in this encounter.   Disposition:  Follow up in a year   Signed, Jenkins Rouge, MD  02/08/2020 9:49 AM    [place]

## 2020-02-08 NOTE — Patient Instructions (Signed)
Medication Instructions:  °*If you need a refill on your cardiac medications before your next appointment, please call your pharmacy* ° °Lab Work: °If you have labs (blood work) drawn today and your tests are completely normal, you will receive your results only by: °• MyChart Message (if you have MyChart) OR °• A paper copy in the mail °If you have any lab test that is abnormal or we need to change your treatment, we will call you to review the results. ° °Testing/Procedures: °None ordered today. ° ° °Follow-Up: °At [facility], you and your health needs are our priority.  As part of our continuing mission to provide you with exceptional heart care, we have created designated Provider Care Teams.  These Care Teams include your primary Cardiologist (physician) and Advanced Practice Providers (APPs -  Physician Assistants and Nurse Practitioners) who all work together to provide you with the care you need, when you need it. ° °We recommend signing up for the patient portal called "MyChart".  Sign up information is provided on this After Visit Summary.  MyChart is used to connect with patients for Virtual Visits (Telemedicine).  Patients are able to view lab/test results, encounter notes, upcoming appointments, etc.  Non-urgent messages can be sent to your provider as well.   °To learn more about what you can do with MyChart, go to [email or web address]://[email or web address].com.   ° °Your next appointment:   °6 month(s) ° °The format for your next appointment:   °In Person ° °Provider:   °You may see Peter Nishan, MD or one of the following Advanced Practice Providers on your designated Care Team:   °· Lori Gerhardt, NP °· Charmain Ingold, NP °· Jill McDaniel, NP ° ° ° °

## 2020-02-24 ENCOUNTER — Ambulatory Visit: Payer: Medicare Other | Admitting: Cardiovascular Disease

## 2020-03-06 ENCOUNTER — Encounter: Payer: Self-pay | Admitting: Family Medicine

## 2020-03-19 ENCOUNTER — Other Ambulatory Visit: Payer: Self-pay | Admitting: Otolaryngology

## 2020-03-19 DIAGNOSIS — J3800 Paralysis of vocal cords and larynx, unspecified: Secondary | ICD-10-CM

## 2020-03-29 ENCOUNTER — Ambulatory Visit
Admission: RE | Admit: 2020-03-29 | Discharge: 2020-03-29 | Disposition: A | Discharge status: Home / Self Care | Payer: Medicare Other | Source: Ambulatory Visit | Attending: Otolaryngology | Admitting: Otolaryngology

## 2020-03-29 DIAGNOSIS — J3800 Paralysis of vocal cords and larynx, unspecified: Secondary | ICD-10-CM

## 2020-03-29 MED ORDER — IOPAMIDOL (ISOVUE-300) INJECTION 61%
75.0000 mL | Freq: Once | INTRAVENOUS | Status: AC | PRN
  Administered 2020-03-29: 75 mL via INTRAVENOUS

## 2020-04-06 ENCOUNTER — Other Ambulatory Visit: Payer: Medicare Other

## 2020-04-10 ENCOUNTER — Other Ambulatory Visit: Payer: Self-pay | Admitting: General Surgery

## 2020-04-10 DIAGNOSIS — Z1231 Encounter for screening mammogram for malignant neoplasm of breast: Secondary | ICD-10-CM

## 2020-05-10 ENCOUNTER — Ambulatory Visit
Admission: RE | Admit: 2020-05-10 | Discharge: 2020-05-10 | Disposition: A | Discharge status: Home / Self Care | Payer: No Typology Code available for payment source | Source: Ambulatory Visit | Attending: General Surgery | Admitting: General Surgery

## 2020-05-10 ENCOUNTER — Other Ambulatory Visit: Payer: Self-pay

## 2020-05-10 DIAGNOSIS — Z1231 Encounter for screening mammogram for malignant neoplasm of breast: Secondary | ICD-10-CM

## 2020-05-24 ENCOUNTER — Telehealth: Payer: Self-pay | Admitting: Family Medicine

## 2020-05-24 NOTE — Telephone Encounter (Signed)
Patient called in asking if Dr.Hunter could prescribe something to help her relax as she just lost her husband recently.

## 2020-05-24 NOTE — Telephone Encounter (Signed)
Please schedule a virtual or OV for pt to discuss with Dr. Yong Channel.

## 2020-05-25 NOTE — Telephone Encounter (Signed)
Patient states she will wait to discuss it when she comes in for an appointment on 9/20

## 2020-06-07 NOTE — Patient Instructions (Addendum)
Please stop by lab before you go If you have mychart- we will send your results within 3 business days of [place] receiving them.  If you do not have mychart- we will call you about results within 5 business days of [place] receiving them.  *please note we are currently using [facility] which has a longer processing time than [place] typically so labs may not come back as quickly as in the past *please also note that you will see labs on mychart as soon as they post. I will later go in and write notes on them- will say "notes from Dr. Yong Channel"  Health Maintenance Due  Topic Date Due  . INFLUENZA VACCINE In office flu shot today high dose 04/22/2020   Consider covid 19 booster at the pharmacy- we do not have this yet- id wait at least 2-4 weeks from flu shot  Schedule follow up with Dr. Delman Cheadle

## 2020-06-07 NOTE — Progress Notes (Signed)
Phone [phone]   Subjective:  Patient presents today for their annual physical. Chief complaint-noted.   See problem oriented charting- ROS- full  review of systems was completed and negative except for: post nasal drip from allergies, light sensitivity with eyes- will call eye doctor, feels cold, memory concerns/decreased concentration, occasional mild lightheadedness  The following were reviewed and entered/updated in epic: Past Medical History:  Diagnosis Date  . Aneurysm of ascending aorta ([place])    a. 4.2-4.3cm in 2014.  Marland Kitchen Anxiety   . Arthritis   . Breast cancer ([place]) 2018   right mastectomy  . Cancer ([place]) 1980   ovarian cancer  . Compression fracture of L1 lumbar vertebra ([facility])   . COMPRESSION FRACTURE, THORACIC VERTEBRA 07/24/2010   Qualifier: Diagnosis of  By: [name], [name]   . Elevated BP Transient 09/2014  . Genetic testing 07/09/2017   Multi-Cancer panel (83 genes) @ [facility] - No pathogenic mutations detected  . GERD (gastroesophageal reflux disease)   . H/O bone density study    2015  . H/O colonoscopy   . History of ovarian cancer    November 1979  . 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 Insomnia   . Irregular heart beat   . Normal coronary arteries Cath - 09/2014  . Osteoporosis   . Raynaud's disease    Patient Active Problem List   Diagnosis Date Noted  . history of Breast cancer of lower-outer quadrant of right female breast  01/16/2016    Priority: High  . Lower abdominal pain 06/28/2010    Priority: High  . Aneurysm of thoracic aorta ([place]) 05/06/2007    Priority: High  . History of ovarian cancer 09/22/1977    Priority: High  . History of adenomatous polyp of colon 12/30/2019    Priority: Medium  . Mild hyperlipidemia 06/01/2019    Priority: Medium  . Chronic cough 01/21/2018    Priority: Medium  . Normal coronary arteries 10/17/2014    Priority: Medium  . Hx of herpes  simplex infection 12/18/2011    Priority: Medium  . Esophageal reflux 05/06/2007    Priority: Medium  . Osteoporosis 03/18/2007    Priority: Medium  . Degenerative arthritis of knee, bilateral 04/08/2019    Priority: Low  . Chronic idiopathic constipation 03/01/2019    Priority: Low  . Genetic testing 07/09/2017    Priority: Low  . Palpitations 11/13/2014    Priority: Low  . Varicose veins of lower extremities with other complications 85/88/5027    Priority: Low  . Low back pain 06/28/2010    Priority: Low  . Generalized anxiety disorder 10/13/2008    Priority: Low  . Irritable bowel syndrome 10/13/2008    Priority: Low  . Lung nodule 11/25/2007    Priority: Low  . Raynaud's syndrome 07/01/2007    Priority: Low  . Allergic rhinitis 05/06/2007    Priority: Low  . DEGENERATIVE DISC DISEASE, CERVICAL SPINE 05/06/2007    Priority: Low   Past Surgical History:  Procedure Laterality Date  . ABDOMINAL HYSTERECTOMY  1979  . APPENDECTOMY    . BILATERAL SALPINGOOPHORECTOMY    . BREAST LUMPECTOMY    . COLONOSCOPY  02/2012  . LEFT HEART CATHETERIZATION WITH CORONARY ANGIOGRAM N/A 10/17/2014   Procedure: LEFT HEART CATHETERIZATION WITH CORONARY ANGIOGRAM;  Surgeon: [name], MD;  Location: [facility] CATH LAB;  Service: Cardiovascular;  Laterality: N/A;  . ovarian ca s/p taj/bso  1979  .  SIMPLE MASTECTOMY WITH AXILLARY SENTINEL NODE BIOPSY Right 05/05/2017   Procedure: RIGHT TOTAL MASTECTOMY WITH RIGHT  AXILLARY SENTINEL NODE BIOPSY;  Surgeon: [name], MD;  Location: [facility];  Service: General;  Laterality: Right;  PECTORAL BLOCK    Family History  Problem Relation Age of Onset  . Hip fracture Mother   . Other Father        lived to 30  . Healthy Brother   . Colon polyps Brother   . Breast cancer Cousin 35       mat first cousin related through uncle  . Esophageal cancer Cousin 90       mat first cousin related through uncle    Medications- reviewed and  updated Current Outpatient Medications  Medication Sig Dispense Refill  . Ascorbic Acid (VITAMIN C) 500 MG CAPS Take 1 tablet by mouth daily.    Marland Kitchen CALCIUM PO Take 1,000 mg by mouth.    Marland Kitchen CALCIUM-MAG-VIT C-VIT D PO Take 15 mLs by mouth daily.    . Cholecalciferol (VITAMIN D3) 1000 units CAPS Take 1,000 Units by mouth daily.    Marland Kitchen esomeprazole (NEXIUM) 20 MG capsule Take 20 mg by mouth daily at 12 noon.    Marland Kitchen MAGNESIUM PO Take 300 mg by mouth.    . valACYclovir (VALTREX) 500 MG tablet Take 1 tablet (500 mg total) by mouth daily. (Patient taking differently: Take 1,000 mg by mouth as needed. ) 90 tablet 3   No current facility-administered medications for this visit.    Allergies-reviewed and updated Allergies  Allergen Reactions  . Chlorhexidine Gluconate Rash    Social History   Social History Narrative   Married. She and her husband are from the [place] area of [place]. Children: Briceida Rasberry, lives in [place], [place], lives in [place], [place]. 4 grandkids. No greatgrandkids.       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    Objective  Objective:  BP 118/78   Pulse 72   Temp 97.6 F (36.4 C) (Temporal)   Resp 18   Ht 5\' 5"  (1.651 m)   Wt 138 lb (62.6 kg)   LMP  (LMP Unknown)   SpO2 97%   BMI 22.96 kg/m  Gen: NAD, resting comfortably HEENT: Mucous membranes are moist. Oropharynx normal Neck: no thyromegaly CV: RRR no murmurs rubs or gallops Lungs: CTAB no crackles, wheeze, rhonchi Abdomen: soft/nontender/nondistended/normal bowel sounds. No rebound or guarding.  Ext: no edema Skin: warm, dry Neuro: grossly normal, moves all extremities, PERRLA Hip: Normal ROM without pain bilaterally    Assessment and Plan   86 y.o. female presenting for annual physical.  Health Maintenance counseling: 1. Anticipatory guidance: Patient counseled regarding regular dental exams q6 months,  eye exams- yearly and needs to schedule with light sensitivity , avoiding smoking and second hand smoke, limiting alcohol to 1 beverage per day- does not drink.   2. Risk factor reduction:  Advised patient of need for regular exercise and diet rich and fruits and vegetables to reduce risk of heart attack and stroke. Exercise- not exercising- plans to restart walking. Diet-reasonably healthy diet- discussed not losing further weight.  Wt Readings from Last 3 Encounters:  06/11/20 138 lb (62.6 kg)  02/08/20 141 lb (64 kg)  01/11/20 136 lb (61.7 kg)  3. Immunizations/screenings/ancillary studies- high dose flu shot today. Also discussed shingirx at pharmacy - declines  for now Immunization History  Administered Date(s) Administered  . Fluad Quad(high Dose 65+) 06/01/2019  . Influenza Split 06/26/2011, 07/07/2012  . Influenza Whole 07/09/1999, 07/01/2007, 06/27/2008, 07/11/2009, 06/28/2010  . Influenza, High Dose Seasonal PF 07/13/2013, 06/26/2015, 07/15/2017, 06/21/2018  . Influenza,inj,Quad PF,6+ Mos 06/22/2014  . Influenza,inj,quad, With Preservative 06/01/2019  . Influenza-Unspecified 08/15/2016  . PFIZER SARS-COV-2 Vaccination 10/14/2019, 11/05/2019  . Pneumococcal Polysaccharide-23 12/20/2012, 11/04/2018  . Td 09/23/2003  . Zoster 01/19/2013   4. Cervical cancer screening- past age based screening recommendations.  No vaginal bleeding or dischrage 5. Breast cancer screening-history of breast cancer in 2018 on the right-mammograms yearly on left breast.  Also follows with Dr. [name] for breast exam  6. Colon cancer screening - denies history of abnormal colonoscopy.  Past age based screening recommendations.  Last colonoscopy was in 2021 due to abdnomal pain- no further repeat. no further colonoscopy despite history of adenomatous colon polyps 7. Skin cancer screening-follows with Dr. Delman Cheadle in the past- encouraged her to reschedule.  advised regular sunscreen use. Denies worrisome,  changing, or new skin lesions.  8. Birth control/STD check- widowed- not a current concern 9. Osteoporosis screening at 11- see discussion below -Neversmoker  Status of chronic or acute concerns   # social update- lost husband august 2021  # memory loss- feels more forgetful. Thinks for about a year. Slightly worse. Has forgotten some appointments but not always getting calls for reminders.  -will check TSH, b12. Declines HIV, RPR. - mourning likely contributes with loss of husband -mini cog 1/3 delayed recall normal clock draw- normal result -discussed if worsens to let us know. Could do repeat mini cog or MMSE -discussed importance of exercise, healthy diet  # hip pain right- more groin type pain with bending . Hip exam pretty reassuring. Mild intermittent for a few months- she will let us know if worsening. Mild pain with deep palpation  # LLQ pain- had colonoscopy and EGD within last year- reassuring. Pain has improved- more intermittent. CT abd/pelvis 08/17/2019 with Deliah Goody PA without obvious cause.   #%- Aneurysm of thoracic aorta S: Gets MRI/MRI with Dr. [name] every other year-last done 12/06/2018.  Blood pressure goal less than 130/80 -Patient with normal coronary arteries in the past but still follows with Dr. Johnsie Cancel every 6 months- palpitations with PVCs/PACs in the past A/P: aneurysm stable- continue every 2 year follow up per Dr. Nyoka Cowden preference    #% Esophageal reflux/history of chronic cough- has seen Dr. Chase Caller in the past S: Compliant with Nexium every other day previously- now on daily 20 mg. Did not tolerate every other day- but at least down from 40mg  A/P: controlled- and has recent EGD. Continue current meds. Update b12 with long term PPI use  -bp at goal without meds   #Osteoporosis S: Patient on calcium and vitamin D.  Took Fosamax for years.  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  DEXA October 2018  showed worst T score at -3.0 lumbar spine. On different machine in 07/2019 worst t score - 2.2 RFN and LFN -Nexium not ideal for osteoporosis  -2020 declined Prolia A/P:  hopefully stable- consider repeat DEXA next visit   #Generalized anxiety disorder S: Anxiety has been controlled without medication recently.  Took medication years ago A/P: wants to remain off meds- will call me if not  - gave # for hospice grief counseling -also recommended possible trial buspirone- she will call if decides she wants this  %  History of ovarian cancer at age 73- infiltrating well differentiated papillary serous cystadenoma  #Mild hyperlipidemia-update lipid panel today but over age 17 unlikely to start statin for primary prevention .   # herpes simplex- remains on valtrex 500 mg weekly and not having flares- still has pills   Recommended follow up: Return in about 6 months (around 12/09/2020) for follow up- or sooner if needed. Future Appointments  Date Time Provider [place]  08/22/2020 10:15 AM [name], MD [facility]   Lab/Order associations: fasting   ICD-10-CM   1. Preventative health care  Z00.00   2. Generalized anxiety disorder  F41.1   3. Mild hyperlipidemia  S17.7 COMPLETE METABOLIC PANEL WITH GFR    CBC    Lipid panel    TSH  4. Thoracic aortic aneurysm without rupture (HCC)  I71.2   5. Other osteoporosis, unspecified pathological fracture presence  M81.8 VITAMIN D 25 Hydroxy (Vit-D Deficiency, Fractures)  6. Gastroesophageal reflux disease without esophagitis  K21.9   7. High risk medication use  Z79.899 Vitamin B12    No orders of the defined types were placed in this encounter.   Return precautions advised.  Garret Reddish, MD

## 2020-06-11 ENCOUNTER — Encounter: Payer: Self-pay | Admitting: Family Medicine

## 2020-06-11 ENCOUNTER — Other Ambulatory Visit: Payer: Self-pay

## 2020-06-11 ENCOUNTER — Ambulatory Visit (INDEPENDENT_AMBULATORY_CARE_PROVIDER_SITE_OTHER): Payer: Medicare Other | Admitting: Family Medicine

## 2020-06-11 VITALS — BP 118/78 | HR 72 | Temp 97.6°F | Resp 18 | Ht 65.0 in | Wt 138.0 lb

## 2020-06-11 DIAGNOSIS — Z79899 Other long term (current) drug therapy: Secondary | ICD-10-CM

## 2020-06-11 DIAGNOSIS — Z23 Encounter for immunization: Secondary | ICD-10-CM | POA: Diagnosis not present

## 2020-06-11 DIAGNOSIS — I712 Thoracic aortic aneurysm, without rupture, unspecified: Secondary | ICD-10-CM

## 2020-06-11 DIAGNOSIS — Z0000 Encounter for general adult medical examination without abnormal findings: Secondary | ICD-10-CM | POA: Diagnosis not present

## 2020-06-11 DIAGNOSIS — M818 Other osteoporosis without current pathological fracture: Secondary | ICD-10-CM

## 2020-06-11 DIAGNOSIS — K219 Gastro-esophageal reflux disease without esophagitis: Secondary | ICD-10-CM

## 2020-06-11 DIAGNOSIS — F411 Generalized anxiety disorder: Secondary | ICD-10-CM | POA: Diagnosis not present

## 2020-06-11 DIAGNOSIS — E785 Hyperlipidemia, unspecified: Secondary | ICD-10-CM

## 2020-06-11 NOTE — Addendum Note (Signed)
Addended by: Milton Ferguson D on: 06/11/2020 09:31 AM   Modules accepted: Orders

## 2020-06-11 NOTE — Addendum Note (Signed)
Addended by: Thomes Cake on: 06/11/2020 09:36 AM   Modules accepted: Orders

## 2020-06-12 LAB — CBC
HCT: 41.9 % (ref 35.0–45.0)
Hemoglobin: 13.5 g/dL (ref 11.7–15.5)
MCH: 28.5 pg (ref 27.0–33.0)
MCHC: 32.2 g/dL (ref 32.0–36.0)
MCV: 88.4 fL (ref 80.0–100.0)
MPV: 10.8 fL (ref 7.5–12.5)
Platelets: 276 10*3/uL (ref 140–400)
RBC: 4.74 10*6/uL (ref 3.80–5.10)
RDW: 12.5 % (ref 11.0–15.0)
WBC: 5.6 10*3/uL (ref 3.8–10.8)

## 2020-06-12 LAB — VITAMIN B12: Vitamin B-12: 758 pg/mL (ref 200–1100)

## 2020-06-12 LAB — COMPLETE METABOLIC PANEL WITH GFR
AG Ratio: 1.6 (calc) (ref 1.0–2.5)
ALT: 10 U/L (ref 6–29)
AST: 16 U/L (ref 10–35)
Albumin: 4.1 g/dL (ref 3.6–5.1)
Alkaline phosphatase (APISO): 65 U/L (ref 37–153)
BUN: 14 mg/dL (ref 7–25)
CO2: 29 mmol/L (ref 20–32)
Calcium: 9.5 mg/dL (ref 8.6–10.4)
Chloride: 100 mmol/L (ref 98–110)
Creat: 0.66 mg/dL (ref 0.60–0.88)
GFR, Est African American: 94 mL/min/{1.73_m2} (ref >=60)
GFR, Est Non African American: 81 mL/min/{1.73_m2} (ref >=60)
Globulin: 2.6 g/dL (calc) (ref 1.9–3.7)
Glucose, Bld: 83 mg/dL (ref 65–99)
Potassium: 4.4 mmol/L (ref 3.5–5.3)
Sodium: 135 mmol/L (ref 135–146)
Total Bilirubin: 0.5 mg/dL (ref 0.2–1.2)
Total Protein: 6.7 g/dL (ref 6.1–8.1)

## 2020-06-12 LAB — LIPID PANEL
Cholesterol: 171 mg/dL (ref <200)
HDL: 65 mg/dL (ref >=50)
LDL Cholesterol (Calc): 93 mg/dL (calc)
Non-HDL Cholesterol (Calc): 106 mg/dL (calc) (ref <130)
Total CHOL/HDL Ratio: 2.6 (calc) (ref <5.0)
Triglycerides: 50 mg/dL (ref <150)

## 2020-06-12 LAB — TSH: TSH: 0.96 mIU/L (ref 0.40–4.50)

## 2020-06-12 LAB — VITAMIN D 25 HYDROXY (VIT D DEFICIENCY, FRACTURES): Vit D, 25-Hydroxy: 61 ng/mL (ref 30–100)

## 2020-06-20 ENCOUNTER — Telehealth: Payer: Self-pay

## 2020-06-20 NOTE — Telephone Encounter (Signed)
Patient would like a phone call about lab results, instead of it being mailed so she is able to understand it better.

## 2020-06-21 NOTE — Telephone Encounter (Signed)
Spoke with the patient and she verbalized understanding her results. No other questions or concerns at this time.  

## 2020-07-30 ENCOUNTER — Other Ambulatory Visit: Payer: Self-pay

## 2020-07-30 ENCOUNTER — Encounter: Payer: Self-pay | Admitting: Family Medicine

## 2020-07-30 ENCOUNTER — Ambulatory Visit (INDEPENDENT_AMBULATORY_CARE_PROVIDER_SITE_OTHER): Payer: Medicare Other | Admitting: Family Medicine

## 2020-07-30 VITALS — BP 120/78 | HR 78 | Temp 97.7°F | Wt 141.4 lb

## 2020-07-30 DIAGNOSIS — M5416 Radiculopathy, lumbar region: Secondary | ICD-10-CM | POA: Diagnosis not present

## 2020-07-30 DIAGNOSIS — G8929 Other chronic pain: Secondary | ICD-10-CM | POA: Diagnosis not present

## 2020-07-30 DIAGNOSIS — M545 Low back pain, unspecified: Secondary | ICD-10-CM

## 2020-07-30 MED ORDER — DICLOFENAC SODIUM 75 MG PO TBEC: 75.0000 mg | DELAYED_RELEASE_TABLET | Freq: Two times a day (BID) | ORAL | 0 refills | Status: DC

## 2020-07-30 MED ORDER — TIZANIDINE HCL 2 MG PO TABS: 2.0000 mg | ORAL_TABLET | Freq: Every evening | ORAL | 0 refills | Status: DC | PRN

## 2020-07-30 NOTE — Progress Notes (Signed)
Subjective  CC:  Chief Complaint  Patient presents with  . Back Pain    bilateral - radiating into thighs, pain started about a month ago, denies any injuries or falls  . Dizziness    HPI: Cindy Schwartz is a 86 y.o. female who presents to the office today to address the problems listed above in the chief complaint.  86 year old female presents since she has not felt well for about a month or so.  She reports low back pain but also describes pain that goes down her legs.  She describes heaviness in her thighs.  She denies weakness.  At times the pain is shooting.  It does not affect her sleep.  He is not affected with exertion or walking.  She denies claudication.  She thought it started after starting exercise walking program.  She has since stopped and the pain continues.  She also complains of upper neck pain and heaviness in her head.  She feels like her thinking is not completely straight.  Unfortunately, she lost her husband 2 months ago.  She denies depression but continues to struggle.  No headaches, diplopia, dysarthria.  She has a history of midline low back pain.  She has a history of compression fractures and osteoporosis.  She is taking Advil only on occasion and this has relieved the pain.  No bowel or bladder dysfunction.  No GI symptoms.   Assessment  1. Chronic midline low back pain without sciatica   2. Radiculopathy, lumbar region      Plan   Back pain:  Likely mutlifactorial. No urgent sxs. OA +/- compression fracture or radicular pain. Doubt claudication. Trial of nsaids and mm relaxer (pt is hesitant to take meds and prefers conservative approach). Check xrays. Red flags discussed. F/u 2-4 weeks with PCP for further eval if not improved.   Likely grieving; need to monitor for depression. Counseling done.   Follow up: 2-4 weeks for recheck.  08/23/2020  Orders Placed This Encounter  Procedures  . DG Lumbar Spine Complete   Meds ordered this encounter  Medications   . tiZANidine (ZANAFLEX) 2 MG tablet    Sig: Take 1 tablet (2 mg total) by mouth at bedtime as needed for muscle spasms.    Dispense:  15 tablet    Refill:  0  . diclofenac (VOLTAREN) 75 MG EC tablet    Sig: Take 1 tablet (75 mg total) by mouth 2 (two) times daily.    Dispense:  30 tablet    Refill:  0      I reviewed the patients updated PMH, FH, and SocHx.    Patient Active Problem List   Diagnosis Date Noted  . History of adenomatous polyp of colon 12/30/2019  . Mild hyperlipidemia 06/01/2019  . Degenerative arthritis of knee, bilateral 04/08/2019  . Chronic idiopathic constipation 03/01/2019  . Chronic cough 01/21/2018  . Genetic testing 07/09/2017  . history of Breast cancer of lower-outer quadrant of right female breast  01/16/2016  . Palpitations 11/13/2014  . Normal coronary arteries 10/17/2014  . Varicose veins of lower extremities with other complications 44/31/5400  . Hx of herpes simplex infection 12/18/2011  . Low back pain 06/28/2010  . Lower abdominal pain 06/28/2010  . Generalized anxiety disorder 10/13/2008  . Irritable bowel syndrome 10/13/2008  . Lung nodule 11/25/2007  . Raynaud's syndrome 07/01/2007  . Aneurysm of thoracic aorta ([place]) 05/06/2007  . Allergic rhinitis 05/06/2007  . Esophageal reflux 05/06/2007  . DEGENERATIVE DISC DISEASE,  CERVICAL SPINE 05/06/2007  . Osteoporosis 03/18/2007  . History of ovarian cancer 09/22/1977   Current Meds  Medication Sig  . Ascorbic Acid (VITAMIN C) 500 MG CAPS Take 1 tablet by mouth daily.  Marland Kitchen CALCIUM PO Take 1,000 mg by mouth.  Marland Kitchen CALCIUM-MAG-VIT C-VIT D PO Take 15 mLs by mouth daily.  . Cholecalciferol (VITAMIN D3) 1000 units CAPS Take 1,000 Units by mouth daily.  Marland Kitchen esomeprazole (NEXIUM) 20 MG capsule Take 20 mg by mouth daily at 12 noon.  Marland Kitchen MAGNESIUM PO Take 300 mg by mouth.  . valACYclovir (VALTREX) 500 MG tablet Take 1 tablet (500 mg total) by mouth daily. (Patient taking differently: Take 1,000 mg by  mouth as needed. )    Allergies: Patient is allergic to chlorhexidine gluconate. Family History: Patient family history includes Breast cancer (age of onset: 100) in her cousin; Colon polyps in her brother; Esophageal cancer (age of onset: 23) in her cousin; Healthy in her brother; Hip fracture in her mother; Other in her father. Social History:  Patient  reports that she has never smoked. She has never used smokeless tobacco. She reports that she does not drink alcohol and does not use drugs.  Review of Systems: Constitutional: Negative for fever malaise or anorexia Cardiovascular: negative for chest pain Respiratory: negative for SOB or persistent cough Gastrointestinal: negative for abdominal pain  Objective  Vitals: BP 120/78   Pulse 78   Temp 97.7 F (36.5 C) (Temporal)   Wt 141 lb 6.4 oz (64.1 kg)   LMP  (LMP Unknown)   SpO2 98%   BMI 23.53 kg/m  General: no acute distress , A&Ox3, moves easily. Tearful Psych: normal speech and cognition HEENT: PEERL, conjunctiva normal, neck is supple Cardiovascular:  RRR without murmur or gallop.  Respiratory:  Good breath sounds bilaterally, CTAB with normal respiratory effort Skin:  Warm, no rashes Back Exam - Normal skin, Spine with normal alignment and without deformity.  No tenderness to vertebral process palpation.  Paraspinous muscles are not tender and without spasm.   Range of motion is full at neck and lumbosacral regions. Pain with full extension and lateral flexion bilaterally DTRs +2 B LE, nl LE strength.  Distal pulses +2 bilaterally      Commons side effects, risks, benefits, and alternatives for medications and treatment plan prescribed today were discussed, and the patient expressed understanding of the given instructions. Patient is instructed to call or message via MyChart if he/she has any questions or concerns regarding our treatment plan. No barriers to understanding were identified. We discussed Red Flag symptoms  and signs in detail. Patient expressed understanding regarding what to do in case of urgent or emergency type symptoms.   Medication list was reconciled, printed and provided to the patient in AVS. Patient instructions and summary information was reviewed with the patient as documented in the AVS. This note was prepared with assistance of Dragon voice recognition software. Occasional wrong-word or sound-a-like substitutions may have occurred due to the inherent limitations of voice recognition software  This visit occurred during the SARS-CoV-2 public health emergency.  Safety protocols were in place, including screening questions prior to the visit, additional usage of staff PPE, and extensive cleaning of exam room while observing appropriate contact time as indicated for disinfecting solutions.

## 2020-07-30 NOTE — Patient Instructions (Signed)
Please return in 2-4 weeks with Dr. Yong Channel for recheck.   Take the voltaren twice a day for 2 weeks, then as needed. Use the muscle relaxers as needed.   Please go to our [facility] office to get your xrays done. You can walk in M-F between 8:30am- noon or 1pm - 5pm. Tell them you are there for xrays ordered by me. They will send me the results, then I will let you know the results with instructions.   Address: [address].  The Xray department is located in the basement.     If you have any questions or concerns, please don't hesitate to send me a message via MyChart or call the office at [phone]. Thank you for visiting with [place] today! It's our pleasure caring for you.

## 2020-07-31 ENCOUNTER — Ambulatory Visit (INDEPENDENT_AMBULATORY_CARE_PROVIDER_SITE_OTHER)
Admission: RE | Admit: 2020-07-31 | Discharge: 2020-07-31 | Disposition: A | Discharge status: Home / Self Care | Payer: Medicare Other | Source: Ambulatory Visit | Attending: Family Medicine | Admitting: Family Medicine

## 2020-07-31 DIAGNOSIS — M545 Low back pain, unspecified: Secondary | ICD-10-CM | POA: Diagnosis not present

## 2020-07-31 DIAGNOSIS — M5416 Radiculopathy, lumbar region: Secondary | ICD-10-CM

## 2020-07-31 DIAGNOSIS — G8929 Other chronic pain: Secondary | ICD-10-CM

## 2020-08-01 NOTE — Progress Notes (Signed)
Please call patient: I have reviewed his/her xray results. Fortunately, her xray is stable without any new or concerning findings. Follow up as directed.

## 2020-08-10 NOTE — Progress Notes (Signed)
Evaluation Performed:  Follow-up visit  Date:  08/22/2020   ID:  Cindy Schwartz, DOB 1934-04-25, MRN [id number]  PCP:  Marin Olp, MD  Cardiologist:  Jenkins Rouge, MD   Electrophysiologist:  None   Chief Complaint:  Palpitations/ Dizziness  History of Present Illness:    86 y.o. with history of HTN, fusiform aortic root dilatation 4.1 cm.  Chest pain January 2016 with normal cardiac catheterization. Palpitations with monitor only showing PACs;/PVC;s  Breast cancer with mastectomy August 2018. Husband diagnosed with lung cancer at same time.   Has two grandson's one [name] married living in [place] and one [name] has been in [place] and [place] cooking with wife  Having some chronic back pain [name] primary ordered plan films 07/31/20 showed chronic stable L1 compression deformity  Echo 01/25/18 showed AV sclerosis EF 60-65% estimated PA pressure 34 mmHg  Husband of 63 years finally passes of ILD/Lung Cancer and pneumonia 05/14/20 She has been even more depressed     Past Medical History:  Diagnosis Date  . Aneurysm of ascending aorta ([place])    a. 4.2-4.3cm in 2014.  Marland Kitchen Anxiety   . Arthritis   . Breast cancer ([place]) 2018   right mastectomy  . Cancer ([place]) 1980   ovarian cancer  . Compression fracture of L1 lumbar vertebra ([facility])   . COMPRESSION FRACTURE, THORACIC VERTEBRA 07/24/2010   Qualifier: Diagnosis of  By: [name], [name]   . Elevated BP Transient 09/2014  . Genetic testing 07/09/2017   Multi-Cancer panel (83 genes) @ [facility] - No pathogenic mutations detected  . GERD (gastroesophageal reflux disease)   . H/O bone density study    2015  . H/O colonoscopy   . History of ovarian cancer    November 1979  . 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 Insomnia   . Irregular heart beat   . Normal coronary arteries Cath - 09/2014  . Osteoporosis   . Raynaud's disease    Past Surgical History:   Procedure Laterality Date  . ABDOMINAL HYSTERECTOMY  1979  . APPENDECTOMY    . BILATERAL SALPINGOOPHORECTOMY    . BREAST LUMPECTOMY    . COLONOSCOPY  02/2012  . LEFT HEART CATHETERIZATION WITH CORONARY ANGIOGRAM N/A 10/17/2014   Procedure: LEFT HEART CATHETERIZATION WITH CORONARY ANGIOGRAM;  Surgeon: [name], MD;  Location: [facility] CATH LAB;  Service: Cardiovascular;  Laterality: N/A;  . ovarian ca s/p taj/bso  1979  . SIMPLE MASTECTOMY WITH AXILLARY SENTINEL NODE BIOPSY Right 05/05/2017   Procedure: RIGHT TOTAL MASTECTOMY WITH RIGHT  AXILLARY SENTINEL NODE BIOPSY;  Surgeon: [name], MD;  Location: [facility];  Service: General;  Laterality: Right;  PECTORAL BLOCK     Current Meds  Medication Sig  . Ascorbic Acid (VITAMIN C) 500 MG CAPS Take 1 tablet by mouth daily.  Marland Kitchen CALCIUM PO Take 1,000 mg by mouth.  Marland Kitchen CALCIUM-MAG-VIT C-VIT D PO Take 15 mLs by mouth daily.  . Cholecalciferol (VITAMIN D3) 1000 units CAPS Take 1,000 Units by mouth daily.  . diclofenac (VOLTAREN) 75 MG EC tablet Take 1 tablet (75 mg total) by mouth 2 (two) times daily.  Marland Kitchen esomeprazole (NEXIUM) 20 MG capsule Take 20 mg by mouth daily at 12 noon.  Marland Kitchen MAGNESIUM PO Take 300 mg by mouth.  Marland Kitchen tiZANidine (ZANAFLEX) 2 MG tablet Take 1 tablet (2 mg total) by mouth at bedtime as needed for muscle spasms.  Marland Kitchen  valACYclovir (VALTREX) 500 MG tablet Take 1 tablet (500 mg total) by mouth daily. (Patient taking differently: Take 1,000 mg by mouth as needed. )     Allergies:   Chlorhexidine gluconate   Social History   Tobacco Use  . Smoking status: Never Smoker  . Smokeless tobacco: Never Used  Vaping Use  . Vaping Use: Never used  Substance Use Topics  . Alcohol use: No  . Drug use: No     Family Hx: The patient's family history includes Breast cancer (age of onset: 45) in her cousin; Colon polyps in her brother; Esophageal cancer (age of onset: 59) in her cousin; Healthy in her brother; Hip fracture in her mother;  Other in her father.  ROS:   Please see the history of present illness.     All other systems reviewed and are negative.   Prior CV studies:   The following studies were reviewed today:  Echo 01/25/18 Holter 09/25/15   Labs/Other Tests and Data Reviewed:    EKG  SR RBBB no acute changes   Recent Labs: 06/11/2020: ALT 10; BUN 14; Creat 0.66; Hemoglobin 13.5; Platelets 276; Potassium 4.4; Sodium 135; TSH 0.96   Recent Lipid Panel Lab Results  Component Value Date/Time   CHOL 171 06/11/2020 09:32 AM   TRIG 50 06/11/2020 09:32 AM   HDL 65 06/11/2020 09:32 AM   CHOLHDL 2.6 06/11/2020 09:32 AM   LDLCALC 93 06/11/2020 09:32 AM   LDLDIRECT 127.5 12/20/2012 09:05 AM    Wt Readings from Last 3 Encounters:  08/22/20 64.4 kg  07/30/20 64.1 kg  06/11/20 62.6 kg     Objective:    Vital Signs:  BP 126/72   Pulse 70   Ht 5\' 5"  (1.651 m)   Wt 64.4 kg   LMP  (LMP Unknown)   SpO2 98%   BMI 23.63 kg/m    Depressed  Well nourished, well developed female in no acute distress. Skin warm and dry No tachypnea Post right mastectomy No JVP elevation No edema 2/6 SEM  Plus 3 PT/DP  ASSESSMENT & PLAN:     1. Dizziness/ Lightheadedness: non cardiac benign holter 09/25/15 normal exam and no CAD at cath 2016   2. H/o PVCs/PAC: benign asymptomatic for most part PRN Inderal   3. Breast CA:  Post right mastectomy [name] 05/05/17 F/U oncology   4. Stress: depression related to husbands death consoled f/u primary   5. Thoracic aortic aneurysm: followed by Dr. [name]. MRA 12/11/18 4.2 cm    6. Murmur: May 2019 TTE with AV sclerosis f/u TTE r/o progressive stenosis    7. Cholesterol:  LDL 113 at her age with no CAD or bad vascular disease would not Rx with statin Which is also her preference   8. Cough:  Cyclic sees pulmonary continue flonase and anti reflux measures atrovent was added Suspect pollen and allergies making things worse currently F/U Dr Yong Channel  COVID-19  Education: The signs and symptoms of COVID-19 were discussed with the patient and how to seek care for testing (follow up with PCP or arrange E-visit).  The importance of social distancing was discussed today.      Medication Adjustments/Labs and Tests Ordered: Current medicines are reviewed at length with the patient today.  Concerns regarding medicines are outlined above.  Tests Ordered:  None   Medication Changes: No orders of the defined types were placed in this encounter.   Disposition:  Follow up in a year   Signed, Collier Salina  Johnsie Cancel, MD  08/22/2020 10:28 AM    [facility]

## 2020-08-22 ENCOUNTER — Encounter: Payer: Self-pay | Admitting: Cardiovascular Disease

## 2020-08-22 ENCOUNTER — Ambulatory Visit (INDEPENDENT_AMBULATORY_CARE_PROVIDER_SITE_OTHER): Payer: Medicare Other | Admitting: Cardiovascular Disease

## 2020-08-22 ENCOUNTER — Other Ambulatory Visit: Payer: Self-pay

## 2020-08-22 VITALS — BP 126/72 | HR 70 | Ht 65.0 in | Wt 142.0 lb

## 2020-08-22 DIAGNOSIS — I712 Thoracic aortic aneurysm, without rupture, unspecified: Secondary | ICD-10-CM

## 2020-08-22 NOTE — Patient Instructions (Addendum)
We will call you within two weeks about your referral to MRI brain. If you do not hear within 3 weeks, give [place] a call.   See me sooner than march if any changes between now and then.   You actually scored pretty well on memory test considering education level and language barriers but we can continue to monitor

## 2020-08-22 NOTE — Progress Notes (Signed)
Phone [phone] In person visit   Subjective:   Cindy Schwartz is a 86 y.o. year old very pleasant female patient who presents for/with See problem oriented charting Chief Complaint  Patient presents with  . Back Pain    Follow up   . Head Problems    she doesn't feel like shes stable, feel like when she turns sometimes she's going to fall.   . Referral    Pt wasnts to talk about being sent to a specialist for his legs.     This visit occurred during the SARS-CoV-2 public health emergency.  Safety protocols were in place, including screening questions prior to the visit, additional usage of staff PPE, and extensive cleaning of exam room while observing appropriate contact time as indicated for disinfecting solutions.   Past Medical History-  Patient Active Problem List   Diagnosis Date Noted  . history of Breast cancer of lower-outer quadrant of right female breast  01/16/2016    Priority: High  . Lower abdominal pain 06/28/2010    Priority: High  . Aneurysm of thoracic aorta ([place]) 05/06/2007    Priority: High  . History of ovarian cancer 09/22/1977    Priority: High  . History of adenomatous polyp of colon 12/30/2019    Priority: Medium  . Mild hyperlipidemia 06/01/2019    Priority: Medium  . Chronic cough 01/21/2018    Priority: Medium  . Normal coronary arteries 10/17/2014    Priority: Medium  . Hx of herpes simplex infection 12/18/2011    Priority: Medium  . Esophageal reflux 05/06/2007    Priority: Medium  . Osteoporosis 03/18/2007    Priority: Medium  . Degenerative arthritis of knee, bilateral 04/08/2019    Priority: Low  . Chronic idiopathic constipation 03/01/2019    Priority: Low  . Genetic testing 07/09/2017    Priority: Low  . Palpitations 11/13/2014    Priority: Low  . Varicose veins of lower extremities with other complications 94/70/9628    Priority: Low  . Low back pain 06/28/2010    Priority: Low  . Generalized anxiety disorder 10/13/2008     Priority: Low  . Irritable bowel syndrome 10/13/2008    Priority: Low  . Lung nodule 11/25/2007    Priority: Low  . Raynaud's syndrome 07/01/2007    Priority: Low  . Allergic rhinitis 05/06/2007    Priority: Low  . DEGENERATIVE DISC DISEASE, CERVICAL SPINE 05/06/2007    Priority: Low    Medications- reviewed and updated Current Outpatient Medications  Medication Sig Dispense Refill  . Ascorbic Acid (VITAMIN C) 500 MG CAPS Take 1 tablet by mouth daily.    Marland Kitchen CALCIUM PO Take 1,000 mg by mouth.    Marland Kitchen CALCIUM-MAG-VIT C-VIT D PO Take 15 mLs by mouth daily.    . Cholecalciferol (VITAMIN D3) 1000 units CAPS Take 1,000 Units by mouth daily.    . diclofenac (VOLTAREN) 75 MG EC tablet Take 1 tablet (75 mg total) by mouth 2 (two) times daily. 30 tablet 0  . esomeprazole (NEXIUM) 20 MG capsule Take 20 mg by mouth daily at 12 noon.    Marland Kitchen MAGNESIUM PO Take 300 mg by mouth.    Marland Kitchen tiZANidine (ZANAFLEX) 2 MG tablet Take 1 tablet (2 mg total) by mouth at bedtime as needed for muscle spasms. 15 tablet 0  . valACYclovir (VALTREX) 500 MG tablet Take 1 tablet (500 mg total) by mouth daily. (Patient taking differently: Take 1,000 mg by mouth as needed. ) 90 tablet 3  No current facility-administered medications for this visit.     Objective:  BP 130/80   Pulse 74   Temp 97.9 F (36.6 C) (Temporal)   Ht 5\' 5"  (1.651 m)   Wt 140 lb (63.5 kg)   LMP  (LMP Unknown)   SpO2 99%   BMI 23.30 kg/m  Gen: NAD, resting comfortably Ext: spider veins noted- patient states mildly tender. No edema     Assessment and Plan   #Memory loss/imbalance/electricity sensation in her scalp S: still complaining of feeling more forgetful over last 15 months at least. For instance had forgotten some appointments but had not always received reminders. TSH b12 normal. HIV and RPR declined.   has a vague feeling of imbalance in her head. Faint sensation of electricity in scalp when she lays down- split second.    Brain MRI  06/02/13 with "Mild small vessel disease type changes.   Abnormality anterior right temporal lobe of indeterminate etiology  possibly related to prior insult as noted above. "  From above in this report "Tiny rounded areas of altered signal intensity right temporal lobe.  Appearance not typical for perivascular spaces or pulsation  artifact. Exact etiology indeterminate. This may reflect result  of prior ischemia, infection, inflammation or trauma. No  surrounding mass. " MMSE - Mini Mental State Exam 08/23/2020  Orientation to time 5  Orientation to Place 5  Registration 3  Attention/ Calculation 2  Recall 2  Language- name 2 objects 1  Language- repeat 1  Language- follow 3 step command 3  Language- read & follow direction 1  Write a sentence 1  Copy design 1  Total score 25  A/P: Patient reported memory loss back in September and initial work-up without MRI was largely reassuring.  Today patient with MMSE 25/30 but 4th or 5th grade education in [place] - never had comparable education here in [place].  Patient also with feeling of imbalance at times as well as electricity sensation in her scalp at times-her main concern is to rule out stroke or mass -We will get MRI of the brain to rule out stroke or mass -I wonder if some of her issues could be stress related after loss of husband -Patient did not report obvious dizziness or vertigo but more of a perception of imbalance without falls or other evidence of true imbalance-we did not complete a neuro logical examination today but we would do this at follow-up or if new or worsening symptoms. We primarily focused on patient's anxiety related to above symptoms -Could consider neurology referral for memory loss but would lean more towards repeating MMSE and only referring if worsening  # GAD and grief S:has tried to deal with grief on her own after losing her husband in August 2021. Has not called hospice grief counseling or buspirone (she  never called back for this)   In the past reported potentially on medication years ago A/P: Patient feels anxiety and grieving reasonably well-controlled-continue to monitor without intervention  # Back Pain S: patient enjoys going on walks but hurts into low back and buttocks- had seen Dr. Jonni Sanger who prescribed diclofenac and tizanidine- tizanidine was helpful- she has not had to use this yet. DDD on x-ray and prior compression fracture (prior on fosamax but had severe jaw damage per [facility] notes.   A/P: back pain in much better position- we opted to monitor only and she can use prior medications if needed.   #Vaccination counseling-patient prefers to wait on COVID-19 booster  at this time.  She has limited interactions and always wears mask.  Recommended at her age I would still proceed forward with booster-she agrees to at least consider  # spider veins that get tender S:does not tolerate compression stockings due to itch.  No calf swelling or tenderness other than over areas where she has spider veins A/P: Patient had high stockings on today and had difficulty removing-she has no calf pain or swelling. I reassured her I do not think there is a DVT/blockage. We discussed compression stockings would likely be beneficial and may be required prior to coverage for treatment from vascular surgery-she ultimately opted to monitor for now only  Recommended follow up: We will keep regularly scheduled March follow-up Future Appointments  Date Time Provider [place]  12/11/2020  8:20 AM Marin Olp, MD [facility]  02/13/2021  8:45 AM [name], MD [facility]    Lab/Order associations:   ICD-10-CM   1. Memory loss  R41.3 MR Brain Wo Contrast  2. Scalp pain  R51.9    Time Spent: 31 minutes of total time (9:20 AM- 9:48 AM, 9:43 PM-9:46 PM) was spent on the date of the encounter performing the following actions: chart review prior to seeing the patient, obtaining  history, performing a medically necessary exam, counseling on the treatment plan, placing orders, and documenting in our EHR.   Return precautions advised.  Garret Reddish, MD

## 2020-08-22 NOTE — Patient Instructions (Addendum)
Medication Instructions:  °*If you need a refill on your cardiac medications before your next appointment, please call your pharmacy* ° °Lab Work: °If you have labs (blood work) drawn today and your tests are completely normal, you will receive your results only by: °• MyChart Message (if you have MyChart) OR °• A paper copy in the mail °If you have any lab test that is abnormal or we need to change your treatment, we will call you to review the results. ° °Testing/Procedures: °None ordered today. ° ° °Follow-Up: °At [facility], you and your health needs are our priority.  As part of our continuing mission to provide you with exceptional heart care, we have created designated Provider Care Teams.  These Care Teams include your primary Cardiologist (physician) and Advanced Practice Providers (APPs -  Physician Assistants and Nurse Practitioners) who all work together to provide you with the care you need, when you need it. ° °We recommend signing up for the patient portal called "MyChart".  Sign up information is provided on this After Visit Summary.  MyChart is used to connect with patients for Virtual Visits (Telemedicine).  Patients are able to view lab/test results, encounter notes, upcoming appointments, etc.  Non-urgent messages can be sent to your provider as well.   °To learn more about what you can do with MyChart, go to [email or web address].   ° °Your next appointment:   °6 month(s) ° °The format for your next appointment:   °In Person ° °Provider:   °You may see Peter Nishan, MD or one of the following Advanced Practice Providers on your designated Care Team:   °· Lori Gerhardt, NP °· Kendre Ingold, NP °· Jill McDaniel, NP ° ° ° °

## 2020-08-23 ENCOUNTER — Ambulatory Visit (INDEPENDENT_AMBULATORY_CARE_PROVIDER_SITE_OTHER): Payer: Medicare Other | Admitting: Family Medicine

## 2020-08-23 ENCOUNTER — Encounter: Payer: Self-pay | Admitting: Family Medicine

## 2020-08-23 VITALS — BP 130/80 | HR 74 | Temp 97.9°F | Ht 65.0 in | Wt 140.0 lb

## 2020-08-23 DIAGNOSIS — R413 Other amnesia: Secondary | ICD-10-CM

## 2020-08-23 DIAGNOSIS — R519 Headache, unspecified: Secondary | ICD-10-CM

## 2020-08-27 ENCOUNTER — Telehealth: Payer: Self-pay

## 2020-08-27 NOTE — Telephone Encounter (Signed)
Patient called in and stated that she wanted to see if the process for her MRI could be sped up before the end of the year.

## 2020-08-29 DIAGNOSIS — M79642 Pain in left hand: Secondary | ICD-10-CM | POA: Insufficient documentation

## 2020-08-29 DIAGNOSIS — G5603 Carpal tunnel syndrome, bilateral upper limbs: Secondary | ICD-10-CM | POA: Insufficient documentation

## 2020-08-29 DIAGNOSIS — M65312 Trigger thumb, left thumb: Secondary | ICD-10-CM | POA: Insufficient documentation

## 2020-08-29 DIAGNOSIS — M79641 Pain in right hand: Secondary | ICD-10-CM | POA: Insufficient documentation

## 2020-08-29 DIAGNOSIS — M19041 Primary osteoarthritis, right hand: Secondary | ICD-10-CM | POA: Insufficient documentation

## 2020-08-29 NOTE — Telephone Encounter (Signed)
Will contact patient after MRI is approved by insurance

## 2020-09-01 NOTE — Telephone Encounter (Signed)
No PA is required patient is scheduled

## 2020-09-10 ENCOUNTER — Ambulatory Visit (INDEPENDENT_AMBULATORY_CARE_PROVIDER_SITE_OTHER): Payer: Medicare Other

## 2020-09-10 ENCOUNTER — Other Ambulatory Visit: Payer: Self-pay

## 2020-09-10 ENCOUNTER — Telehealth: Payer: Self-pay

## 2020-09-10 DIAGNOSIS — Z1152 Encounter for screening for COVID-19: Secondary | ICD-10-CM

## 2020-09-10 DIAGNOSIS — Z0000 Encounter for general adult medical examination without abnormal findings: Secondary | ICD-10-CM | POA: Diagnosis not present

## 2020-09-10 DIAGNOSIS — M170 Bilateral primary osteoarthritis of knee: Secondary | ICD-10-CM | POA: Insufficient documentation

## 2020-09-10 NOTE — Telephone Encounter (Signed)
You can order and state screening for covid 19- im not sure if it will be covered but so far I have not heard back from patients that it is not getting covered.

## 2020-09-10 NOTE — Telephone Encounter (Signed)
Called and LVM to schedule lab appointment

## 2020-09-10 NOTE — Patient Instructions (Signed)
Cindy Schwartz , Thank you for taking time to come for your Medicare Wellness Visit. I appreciate your ongoing commitment to your health goals. Please review the following plan we discussed and let me know if I can assist you in the future.   Screening recommendations/referrals: Colonoscopy: Done 12/13/19 Mammogram: Done 05/10/20 Bone Density: Done 08/03/19 Recommended yearly ophthalmology/optometry visit for glaucoma screening and checkup Recommended yearly dental visit for hygiene and checkup  Vaccinations: Influenza vaccine: Done 06/11/20 Up to date Pneumococcal vaccine: Declined  Tdap vaccine: Due and discussed Shingles vaccine: Shingrix discussed. Please contact your pharmacy for coverage information.    Covid-19:Completed 1/22 & 11/05/19  Advanced directives: Please bring a copy of your health care power of attorney and living will to the office at your convenience.   Conditions/risks identified: Exercise more  Next appointment: Follow up in one year for your annual wellness visit    Preventive Care 65 Years and Older, Female Preventive care refers to lifestyle choices and visits with your health care provider that can promote health and wellness. What does preventive care include?  A yearly physical exam. This is also called an annual well check.  Dental exams once or twice a year.  Routine eye exams. Ask your health care provider how often you should have your eyes checked.  Personal lifestyle choices, including:  Daily care of your teeth and gums.  Regular physical activity.  Eating a healthy diet.  Avoiding tobacco and drug use.  Limiting alcohol use.  Practicing safe sex.  Taking low-dose aspirin every day.  Taking vitamin and mineral supplements as recommended by your health care provider. What happens during an annual well check? The services and screenings done by your health care provider during your annual well check will depend on your age, overall health,  lifestyle risk factors, and family history of disease. Counseling  Your health care provider may ask you questions about your:  Alcohol use.  Tobacco use.  Drug use.  Emotional well-being.  Home and relationship well-being.  Sexual activity.  Eating habits.  History of falls.  Memory and ability to understand (cognition).  Work and work Statistician.  Reproductive health. Screening  You may have the following tests or measurements:  Height, weight, and BMI.  Blood pressure.  Lipid and cholesterol levels. These may be checked every 5 years, or more frequently if you are over 79 years old.  Skin check.  Lung cancer screening. You may have this screening every year starting at age 58 if you have a 30-pack-year history of smoking and currently smoke or have quit within the past 15 years.  Fecal occult blood test (FOBT) of the stool. You may have this test every year starting at age 30.  Flexible sigmoidoscopy or colonoscopy. You may have a sigmoidoscopy every 5 years or a colonoscopy every 10 years starting at age 29.  Hepatitis C blood test.  Hepatitis B blood test.  Sexually transmitted disease (STD) testing.  Diabetes screening. This is done by checking your blood sugar (glucose) after you have not eaten for a while (fasting). You may have this done every 1-3 years.  Bone density scan. This is done to screen for osteoporosis. You may have this done starting at age 11.  Mammogram. This may be done every 1-2 years. Talk to your health care provider about how often you should have regular mammograms. Talk with your health care provider about your test results, treatment options, and if necessary, the need for more tests. Vaccines  Your health care provider may recommend certain vaccines, such as:  Influenza vaccine. This is recommended every year.  Tetanus, diphtheria, and acellular pertussis (Tdap, Td) vaccine. You may need a Td booster every 10 years.  Zoster  vaccine. You may need this after age 66.  Pneumococcal 13-valent conjugate (PCV13) vaccine. One dose is recommended after age 21.  Pneumococcal polysaccharide (PPSV23) vaccine. One dose is recommended after age 70. Talk to your health care provider about which screenings and vaccines you need and how often you need them. This information is not intended to replace advice given to you by your health care provider. Make sure you discuss any questions you have with your health care provider. Document Released: 10/05/2015 Document Revised: 05/28/2016 Document Reviewed: 07/10/2015 Elsevier Interactive Patient Education  2017 [place] Prevention in the Home Falls can cause injuries. They can happen to people of all ages. There are many things you can do to make your home safe and to help prevent falls. What can I do on the outside of my home?  Regularly fix the edges of walkways and driveways and fix any cracks.  Remove anything that might make you trip as you walk through a door, such as a raised step or threshold.  Trim any bushes or trees on the path to your home.  Use bright outdoor lighting.  Clear any walking paths of anything that might make someone trip, such as rocks or tools.  Regularly check to see if handrails are loose or broken. Make sure that both sides of any steps have handrails.  Any raised decks and porches should have guardrails on the edges.  Have any leaves, snow, or ice cleared regularly.  Use sand or salt on walking paths during winter.  Clean up any spills in your garage right away. This includes oil or grease spills. What can I do in the bathroom?  Use night lights.  Install grab bars by the toilet and in the tub and shower. Do not use towel bars as grab bars.  Use non-skid mats or decals in the tub or shower.  If you need to sit down in the shower, use a plastic, non-slip stool.  Keep the floor dry. Clean up any water that spills on the  floor as soon as it happens.  Remove soap buildup in the tub or shower regularly.  Attach bath mats securely with double-sided non-slip rug tape.  Do not have throw rugs and other things on the floor that can make you trip. What can I do in the bedroom?  Use night lights.  Make sure that you have a light by your bed that is easy to reach.  Do not use any sheets or blankets that are too big for your bed. They should not hang down onto the floor.  Have a firm chair that has side arms. You can use this for support while you get dressed.  Do not have throw rugs and other things on the floor that can make you trip. What can I do in the kitchen?  Clean up any spills right away.  Avoid walking on wet floors.  Keep items that you use a lot in easy-to-reach places.  If you need to reach something above you, use a strong step stool that has a grab bar.  Keep electrical cords out of the way.  Do not use floor polish or wax that makes floors slippery. If you must use wax, use non-skid floor wax.  Do  not have throw rugs and other things on the floor that can make you trip. What can I do with my stairs?  Do not leave any items on the stairs.  Make sure that there are handrails on both sides of the stairs and use them. Fix handrails that are broken or loose. Make sure that handrails are as long as the stairways.  Check any carpeting to make sure that it is firmly attached to the stairs. Fix any carpet that is loose or worn.  Avoid having throw rugs at the top or bottom of the stairs. If you do have throw rugs, attach them to the floor with carpet tape.  Make sure that you have a light switch at the top of the stairs and the bottom of the stairs. If you do not have them, ask someone to add them for you. What else can I do to help prevent falls?  Wear shoes that:  Do not have high heels.  Have rubber bottoms.  Are comfortable and fit you well.  Are closed at the toe. Do not wear  sandals.  If you use a stepladder:  Make sure that it is fully opened. Do not climb a closed stepladder.  Make sure that both sides of the stepladder are locked into place.  Ask someone to hold it for you, if possible.  Clearly mark and make sure that you can see:  Any grab bars or handrails.  First and last steps.  Where the edge of each step is.  Use tools that help you move around (mobility aids) if they are needed. These include:  Canes.  Walkers.  Scooters.  Crutches.  Turn on the lights when you go into a dark area. Replace any light bulbs as soon as they burn out.  Set up your furniture so you have a clear path. Avoid moving your furniture around.  If any of your floors are uneven, fix them.  If there are any pets around you, be aware of where they are.  Review your medicines with your doctor. Some medicines can make you feel dizzy. This can increase your chance of falling. Ask your doctor what other things that you can do to help prevent falls. This information is not intended to replace advice given to you by your health care provider. Make sure you discuss any questions you have with your health care provider. Document Released: 07/05/2009 Document Revised: 02/14/2016 Document Reviewed: 10/13/2014 Elsevier Interactive Patient Education  2017 [employer].

## 2020-09-10 NOTE — Progress Notes (Signed)
Virtual Visit via Telephone Note  I connected with  Cindy Schwartz on 09/10/20 at  8:45 AM EST by telephone and verified that I am speaking with the correct person using two identifiers.  Medicare Annual Wellness visit completed telephonically due to Covid-19 pandemic.   Persons participating in this call: This Health Coach and this patient.   Location: Patient: Home Provider: Office   I discussed the limitations, risks, security and privacy concerns of performing an evaluation and management service by telephone and the availability of in person appointments. The patient expressed understanding and agreed to proceed.  Unable to perform video visit due to video visit attempted and failed and/or patient does not have video capability.   Some vital signs may be absent or patient reported.   Willette Brace, LPN    Subjective:   Cindy Schwartz is a 86 y.o. female who presents for Medicare Annual (Subsequent) preventive examination.  Review of Systems     Cardiac Risk Factors include: advanced age (>42men, >51 women);dyslipidemia;hypertension     Objective:    Today's Vitals   09/10/20 0850  PainSc: 5    There is no height or weight on file to calculate BMI.  Advanced Directives 09/10/2020 08/25/2019 11/15/2018 06/25/2017 05/05/2017 04/27/2017 03/26/2017  Does Patient Have a Medical Advance Directive? Yes Yes No;Yes Yes Yes Yes Yes  Type of Advance Directive Living will [place];Living will [place];Living will;Out of facility DNR (pink MOST or yellow form) [place];Living will [place];Living will - -  Does patient want to make changes to medical advance directive? - No - Patient declined - No - Patient declined No - Patient declined - -  Copy of [place] in Chart? - No - copy requested No - copy requested No - copy requested No - copy requested - -    Current Medications  (verified) Outpatient Encounter Medications as of 09/10/2020  Medication Sig  . Ascorbic Acid (VITAMIN C) 500 MG CAPS Take 1 tablet by mouth daily.  Marland Kitchen CALCIUM PO Take 1,000 mg by mouth.  Marland Kitchen CALCIUM-MAG-VIT C-VIT D PO Take 15 mLs by mouth daily.  . Cholecalciferol (VITAMIN D3) 1000 units CAPS Take 1,000 Units by mouth daily.  Marland Kitchen esomeprazole (NEXIUM) 20 MG capsule Take 20 mg by mouth daily at 12 noon.  Marland Kitchen MAGNESIUM PO Take 300 mg by mouth.  . Turmeric 500 MG TABS 1 capsule  . valACYclovir (VALTREX) 500 MG tablet Take 1 tablet (500 mg total) by mouth daily. (Patient taking differently: Take 1,000 mg by mouth as needed.)  . diclofenac (VOLTAREN) 75 MG EC tablet Take 1 tablet (75 mg total) by mouth 2 (two) times daily. (Patient not taking: No sig reported)  . [DISCONTINUED] tiZANidine (ZANAFLEX) 2 MG tablet Take 1 tablet (2 mg total) by mouth at bedtime as needed for muscle spasms. (Patient not taking: Reported on 09/10/2020)   No facility-administered encounter medications on file as of 09/10/2020.    Allergies (verified) Chlorhexidine gluconate   History: Past Medical History:  Diagnosis Date  . Aneurysm of ascending aorta ([place])    a. 4.2-4.3cm in 2014.  Marland Kitchen Anxiety   . Arthritis   . Breast cancer ([place]) 2018   right mastectomy  . Cancer ([place]) 1980   ovarian cancer  . Compression fracture of L1 lumbar vertebra ([facility])   . COMPRESSION FRACTURE, THORACIC VERTEBRA 07/24/2010   Qualifier: Diagnosis of  By: [name], Balinda Quails   .  Elevated BP Transient 09/2014  . Genetic testing 07/09/2017   Multi-Cancer panel (83 genes) @ [facility] - No pathogenic mutations detected  . GERD (gastroesophageal reflux disease)   . H/O bone density study    2015  . H/O colonoscopy   . History of ovarian cancer    November 1979  . 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 Insomnia   . Irregular heart beat   . Normal coronary arteries Cath - 09/2014  .  Osteoporosis   . Raynaud's disease    Past Surgical History:  Procedure Laterality Date  . ABDOMINAL HYSTERECTOMY  1979  . APPENDECTOMY    . BILATERAL SALPINGOOPHORECTOMY    . BREAST LUMPECTOMY    . COLONOSCOPY  02/2012  . LEFT HEART CATHETERIZATION WITH CORONARY ANGIOGRAM N/A 10/17/2014   Procedure: LEFT HEART CATHETERIZATION WITH CORONARY ANGIOGRAM;  Surgeon: [name], MD;  Location: [facility] C[facility] LAB;  Service: Cardiovascular;  Laterality: N/A;  . ovarian ca s/p taj/bso  1979  . SIMPLE MASTECTOMY WITH AXILLARY SENTINEL NODE BIOPSY Right 05/05/2017   Procedure: RIGHT TOTAL MASTECTOMY WITH RIGHT  AXILLARY SENTINEL NODE BIOPSY;  Surgeon: [name], MD;  Location: [facility];  Service: General;  Laterality: Right;  PECTORAL BLOCK   Family History  Problem Relation Age of Onset  . Hip fracture Mother   . Other Father        lived to 98  . Healthy Brother   . Colon polyps Brother   . Breast cancer Cousin 28       mat first cousin related through uncle  . Esophageal cancer Cousin 40       mat first cousin related through uncle   Social History   Socioeconomic History  . Marital status: Married    Spouse name: Publishing copy  . Number of children: 2  . Years of education: Not on file  . Highest education level: Not on file  Occupational History  . Occupation: retired    Fish farm manager: RETIRED  Tobacco Use  . Smoking status: Never Smoker  . Smokeless tobacco: Never Used  Vaping Use  . Vaping Use: Never used  Substance and Sexual Activity  . Alcohol use: No  . Drug use: No  . Sexual activity: Not on file  Other Topics Concern  . Not on file  Social History Narrative   Married. She and her husband are from the [place] area of [place]. Children: Kanasia Gayman, lives in [place], [place], lives in [place], [place]. 4 grandkids. No greatgrandkids.       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     Social Determinants of Health   Financial Resource Strain: Low Risk   . Difficulty of Paying Living Expenses: Not hard at all  Food Insecurity: No Food Insecurity  . Worried About Charity fundraiser in the Last Year: Never true  . Ran Out of Food in the Last Year: Never true  Transportation Needs: No Transportation Needs  . Lack of Transportation (Medical): No  . Lack of Transportation (Non-Medical): No  Physical Activity: Inactive  . Days of Exercise per Week: 0 days  . Minutes of Exercise per Session: 0 min  Stress: No Stress Concern Present  . Feeling of Stress : Not at all  Social Connections: Moderately Integrated  . Frequency of Communication with Friends  and Family: More than three times a week  . Frequency of Social Gatherings with Friends and Family: More than three times a week  . Attends Religious Services: More than 4 times per year  . Active Member of Clubs or Organizations: No  . Attends Archivist Meetings: Never  . Marital Status: Married    Tobacco Counseling Counseling given: Not Answered   Clinical Intake:  Pre-visit preparation completed: Yes  Pain : 0-10 (left leg aching) Pain Score: 5  Pain Type: Chronic pain Pain Location: Leg Pain Orientation: Left Pain Descriptors / Indicators: Aching Pain Onset: More than a month ago Pain Frequency: Intermittent     BMI - recorded: 23.3 Nutritional Status: BMI of 19-24  Normal Diabetes: No  How often do you need to have someone help you when you read instructions, pamphlets, or other written materials from your doctor or pharmacy?: 1 - Never  Diabetic?No  Interpreter Needed?: No  Information entered by :: Charlott Rakes, LPN   Activities of Daily Living In your present state of health, do you have any difficulty performing the following activities: 09/10/2020  Hearing? N  Vision? N  Difficulty concentrating or making decisions? Y  Comment concentrating can be hard at times  Walking  or climbing stairs? Y  Comment related to stiff knees  Dressing or bathing? N  Doing errands, shopping? N  Preparing Food and eating ? N  Using the Toilet? N  In the past six months, have you accidently leaked urine? N  Do you have problems with loss of bowel control? N  Managing your Medications? N  Managing your Finances? N  Housekeeping or managing your Housekeeping? N  Some recent data might be hidden    Patient Care Team: Marin Olp, MD as PCP - General (Family Medicine) [name], MD as PCP - Cardiology (Cardiology) Ronald Lobo, MD as Consulting Physician (Gastroenterology) Deliah Goody, PA-C as Physician Assistant (Physician Assistant) Neldon Mc Donnamarie Poag, MD as Consulting Physician (Allergy and Immunology) Shon Hough, MD as Consulting Physician (Ophthalmology)  Indicate any recent Medical Services you may have received from other than [facility] providers in the past year (date may be approximate).     Assessment:   This is a routine wellness examination for Cindy Schwartz.  Hearing/Vision screen  Hearing Screening   125Hz  250Hz  500Hz  1000Hz  2000Hz  3000Hz  4000Hz  6000Hz  8000Hz   Right ear:           Left ear:           Comments: Denies any hearing issues   Vision Screening Comments: Pt follows [facility]  Dietary issues and exercise activities discussed: Current Exercise Habits: The patient does not participate in regular exercise at present, Exercise limited by: orthopedic condition(s)  Goals    . Patient Stated     Maintain independence- manage caregiver burden appropriately     . Patient Stated     Increase exercise       Depression Screen PHQ 2/9 Scores 09/10/2020 11/30/2019 08/25/2019 06/01/2019 11/04/2018 01/21/2018 12/26/2015  PHQ - 2 Score 0 0 0 0 0 0 1    Fall Risk Fall Risk  09/10/2020 08/25/2019 06/01/2019 11/04/2018 01/21/2018  Falls in the past year? 0 0 0 0 No  Number falls in past yr: 0 - 0 0 -  Injury with Fall? 0 0 0 0 -   Risk for fall due to : Impaired vision;Impaired balance/gait - - - -  Follow up Falls prevention discussed Falls  evaluation completed;Education provided;Falls prevention discussed - - -    FALL RISK PREVENTION PERTAINING TO THE HOME:  Any stairs in or around the home? Yes  If so, are there any without handrails? No  Home free of loose throw rugs in walkways, pet beds, electrical cords, etc? Yes  Adequate lighting in your home to reduce risk of falls? Yes   ASSISTIVE DEVICES UTILIZED TO PREVENT FALLS:  Life alert? No  Use of a cane, walker or w/c? No  Grab bars in the bathroom? No  Shower chair or bench in shower? No  Elevated toilet seat or a handicapped toilet? No   TIMED UP AND GO:  Was the test performed? No .      Cognitive Function: MMSE - Mini Mental State Exam 08/23/2020  Orientation to time 5  Orientation to Place 5  Registration 3  Attention/ Calculation 2  Recall 2  Language- name 2 objects 1  Language- repeat 1  Language- follow 3 step command 3  Language- read & follow direction 1  Write a sentence 1  Copy design 1  Total score 25     6CIT Screen 09/10/2020  What Year? 0 points  What month? 0 points  Count back from 20 0 points  Months in reverse 0 points  Repeat phrase 0 points    Immunizations Immunization History  Administered Date(s) Administered  . Fluad Quad(high Dose 65+) 06/01/2019, 06/11/2020  . Influenza Split 06/26/2011, 07/07/2012  . Influenza Whole 07/09/1999, 07/01/2007, 06/27/2008, 07/11/2009, 06/28/2010  . Influenza, High Dose Seasonal PF 07/13/2013, 06/26/2015, 07/15/2017, 06/21/2018  . Influenza,inj,Quad PF,6+ Mos 06/22/2014  . Influenza,inj,quad, With Preservative 06/01/2019  . Influenza-Unspecified 08/15/2016  . PFIZER SARS-COV-2 Vaccination 10/14/2019, 11/05/2019  . Pneumococcal Polysaccharide-23 12/20/2012, 11/04/2018  . Td 09/23/2003  . Zoster 01/19/2013    TDAP status: Due, Education has been provided regarding the  importance of this vaccine. Advised may receive this vaccine at local pharmacy or Health Dept. Aware to provide a copy of the vaccination record if obtained from local pharmacy or Health Dept. Verbalized acceptance and understanding.  Flu Vaccine status: Up to date  Done 06/11/20 Pneumococcal vaccine status: Declined,  Education has been provided regarding the importance of this vaccine but patient still declined. Advised may receive this vaccine at local pharmacy or Health Dept. Aware to provide a copy of the vaccination record if obtained from local pharmacy or Health Dept. Verbalized acceptance and understanding.   Covid-19 vaccine status: Completed vaccines  Qualifies for Shingles Vaccine? Yes   Zostavax completed Yes   Shingrix Completed?: No.    Education has been provided regarding the importance of this vaccine. Patient has been advised to call insurance company to determine out of pocket expense if they have not yet received this vaccine. Advised may also receive vaccine at local pharmacy or Health Dept. Verbalized acceptance and understanding.  Screening Tests Health Maintenance  Topic Date Due  . COVID-19 Vaccine (3 - Pfizer risk 4-dose series) 12/03/2019  . TETANUS/TDAP  07/09/2021 (Originally 09/22/2013)  . INFLUENZA VACCINE  Completed  . DEXA SCAN  Completed  . PNA vac Low Risk Adult  Discontinued    Health Maintenance  Health Maintenance Due  Topic Date Due  . COVID-19 Vaccine (3 - Pfizer risk 4-dose series) 12/03/2019    Colorectal cancer screening: Type of screening: Colonoscopy. Completed 12/13/19. Repeat every no longer required years  Mammogram status: Completed 05/10/20. Repeat every year  Bone Density status: Completed 08/03/19. Results reflect: Bone density results:  OSTEOPOROSIS. Repeat every 2 years.  Additional Screening:   Vision Screening: Recommended annual ophthalmology exams for early detection of glaucoma and other disorders of the eye. Is the patient  up to date with their annual eye exam?  Yes  Who is the provider or what is the name of the office in which the patient attends annual eye exams? [facility]   Dental Screening: Recommended annual dental exams for proper oral hygiene  Community Resource Referral / Chronic Care Management: CRR required this visit?  No   CCM required this visit?  No      Plan:     I have personally reviewed and noted the following in the patient's chart:   . Medical and social history . Use of alcohol, tobacco or illicit drugs  . Current medications and supplements . Functional ability and status . Nutritional status . Physical activity . Advanced directives . List of other physicians . Hospitalizations, surgeries, and ER visits in previous 12 months . Vitals . Screenings to include cognitive, depression, and falls . Referrals and appointments  In addition, I have reviewed and discussed with patient certain preventive protocols, quality metrics, and best practice recommendations. A written personalized care plan for preventive services as well as general preventive health recommendations were provided to patient.     Willette Brace, LPN   [id number]   Nurse Notes: None

## 2020-09-10 NOTE — Telephone Encounter (Signed)
Lab ordered, ok to schedule lab appt.

## 2020-09-10 NOTE — Telephone Encounter (Signed)
See below

## 2020-09-10 NOTE — Telephone Encounter (Signed)
Pt called stating she would like to get her antibodies tested. Pt asked if she needs to see Dr. Yong Channel or if he can put in an order for her to get the lab work done. Pt last saw Dr. Yong Channel on 08/23/2020. Please advise.

## 2020-09-11 ENCOUNTER — Other Ambulatory Visit: Payer: Medicare Other

## 2020-09-11 DIAGNOSIS — Z1152 Encounter for screening for COVID-19: Secondary | ICD-10-CM

## 2020-09-12 LAB — SARS COV-2 SEROLOGY(COVID-19)AB(IGG,IGM),IMMUNOASSAY
SARS CoV-2 AB IgG: NEGATIVE
SARS CoV-2 IgM: NEGATIVE

## 2020-09-13 ENCOUNTER — Other Ambulatory Visit: Payer: Medicare Other

## 2020-09-17 ENCOUNTER — Ambulatory Visit
Admission: RE | Admit: 2020-09-17 | Discharge: 2020-09-17 | Disposition: A | Discharge status: Home / Self Care | Payer: Medicare Other | Source: Ambulatory Visit | Attending: Family Medicine | Admitting: Family Medicine

## 2020-09-17 ENCOUNTER — Other Ambulatory Visit: Payer: Self-pay

## 2020-09-17 ENCOUNTER — Telehealth: Payer: Self-pay

## 2020-09-17 DIAGNOSIS — R413 Other amnesia: Secondary | ICD-10-CM

## 2020-09-17 NOTE — Telephone Encounter (Signed)
Pt says that if her antibodies are strong enough she does not need to take the Booster. She mentions having more aches and pain in knees, and she is still undecided on Booster. She says the reason she took the antibody test is to see how strong the antibodies were. I specified that because she is 59 and older it is recommended that she get it, but it is her choice. She says that she does not feel like Dr Durene Cal or myself do not understand what she is requesting.   Please Advise.

## 2020-09-17 NOTE — Telephone Encounter (Signed)
Pt returned call about lab results. Relayed to pt that she has no antibodies and is good to get the booster. Pt says she does not understand and wants a specific number of her antibodies. Pt wants a cma to call her .

## 2020-09-19 NOTE — Telephone Encounter (Signed)
We tested her for antibodies and there were none. She has no evidence of immunity to covid at this time. I would recommend she get the booster as a result of having no antibodies- booster will help her build antibodies back up

## 2020-09-24 NOTE — Telephone Encounter (Signed)
Attempted to give message below to pt. Phone was busy, I was not able to lvm. I will attempt to give pt a call at a later time.

## 2020-09-25 ENCOUNTER — Other Ambulatory Visit: Payer: Self-pay

## 2020-09-25 MED ORDER — ASPIRIN EC 81 MG PO TBEC: 81.0000 mg | DELAYED_RELEASE_TABLET | Freq: Every day | ORAL | 3 refills | Status: DC

## 2020-09-25 MED ORDER — ROSUVASTATIN CALCIUM 5 MG PO TABS: 5.0000 mg | ORAL_TABLET | Freq: Every day | ORAL | 3 refills | Status: DC

## 2020-09-27 ENCOUNTER — Telehealth: Payer: Self-pay

## 2020-09-27 NOTE — Telephone Encounter (Signed)
Left detailed voicemail about this mainly being atherosclerosis/hardening of arteries and need to control risk factors(voicemail confirms [employer]) and also encouraged him to come by to sign DPR if mom is willing

## 2020-09-27 NOTE — Telephone Encounter (Signed)
Patient has given a verbal ok to speak with her son Tawona Filsinger at [phone] in regard to Plaque in her arteries in brain.

## 2020-10-04 ENCOUNTER — Other Ambulatory Visit: Payer: Self-pay

## 2020-10-04 NOTE — Progress Notes (Signed)
Phone [phone] In person visit   Subjective:   Cindy Schwartz is a 87 y.o. year old very pleasant female patient who presents for/with See problem oriented charting Chief Complaint  Patient presents with  . Results    MRI    This visit occurred during the SARS-CoV-2 public health emergency.  Safety protocols were in place, including screening questions prior to the visit, additional usage of staff PPE, and extensive cleaning of exam room while observing appropriate contact time as indicated for disinfecting solutions.   Past Medical History-  Patient Active Problem List   Diagnosis Date Noted  . History of cerebellar stroke 10/05/2020    Priority: High  . Memory loss 10/05/2020    Priority: High  . history of Breast cancer of lower-outer quadrant of right female breast  01/16/2016    Priority: High  . Lower abdominal pain 06/28/2010    Priority: High  . Aneurysm of thoracic aorta ([place]) 05/06/2007    Priority: High  . History of ovarian cancer 09/22/1977    Priority: High  . History of adenomatous polyp of colon 12/30/2019    Priority: Medium  . Mild hyperlipidemia 06/01/2019    Priority: Medium  . Chronic cough 01/21/2018    Priority: Medium  . Normal coronary arteries 10/17/2014    Priority: Medium  . Hx of herpes simplex infection 12/18/2011    Priority: Medium  . Esophageal reflux 05/06/2007    Priority: Medium  . Osteoporosis 03/18/2007    Priority: Medium  . Degenerative arthritis of knee, bilateral 04/08/2019    Priority: Low  . Chronic idiopathic constipation 03/01/2019    Priority: Low  . Genetic testing 07/09/2017    Priority: Low  . Palpitations 11/13/2014    Priority: Low  . Varicose veins of lower extremities with other complications 43/15/4008    Priority: Low  . Low back pain 06/28/2010    Priority: Low  . Generalized anxiety disorder 10/13/2008    Priority: Low  . Irritable bowel syndrome 10/13/2008    Priority: Low  . Lung nodule 11/25/2007     Priority: Low  . Raynaud's syndrome 07/01/2007    Priority: Low  . Allergic rhinitis 05/06/2007    Priority: Low  . DEGENERATIVE DISC DISEASE, CERVICAL SPINE 05/06/2007    Priority: Low    Medications- reviewed and updated Current Outpatient Medications  Medication Sig Dispense Refill  . Ascorbic Acid (VITAMIN C) 500 MG CAPS Take 1 tablet by mouth daily.    Marland Kitchen aspirin EC 81 MG tablet Take 1 tablet (81 mg total) by mouth daily. Swallow whole. 90 tablet 3  . CALCIUM PO Take 1,000 mg by mouth.    Marland Kitchen CALCIUM-MAG-VIT C-VIT D PO Take 15 mLs by mouth daily.    . Cholecalciferol (VITAMIN D3) 1000 units CAPS Take 1,000 Units by mouth daily.    Marland Kitchen esomeprazole (NEXIUM) 20 MG capsule Take 20 mg by mouth daily at 12 noon.    Marland Kitchen ipratropium (ATROVENT) 0.06 % nasal spray Place 2 sprays into both nostrils 2 (two) times daily.    Marland Kitchen MAGNESIUM PO Take 300 mg by mouth.    . rosuvastatin (CRESTOR) 5 MG tablet Take 1 tablet (5 mg total) by mouth daily. Take 1 tablet daily by mouth. 90 tablet 3  . Turmeric 500 MG TABS 1 capsule    . valACYclovir (VALTREX) 500 MG tablet Take 1 tablet (500 mg total) by mouth daily. (Patient taking differently: Take 1,000 mg by mouth as needed.) 90 tablet  3   No current facility-administered medications for this visit.     Objective:  BP 130/76   Pulse 85   Temp 97.9 F (36.6 C) (Temporal)   Ht 5\' 5"  (1.651 m)   Wt 140 lb 12.8 oz (63.9 kg)   LMP  (LMP Unknown)   SpO2 99%   BMI 23.43 kg/m  Gen: NAD, resting comfortably CV: RRR no murmurs rubs or gallops Lungs: CTAB no crackles, wheeze, rhonchi Ext: no edema Skin: warm, dry     Assessment and Plan    #Follow-up for memory loss/imbalance/electricity sensation in scalp S: At last visit approximately a month ago patient complained of feeling more forgetful over the last 15 months or so.  She had forgotten some appointments which was not a regular occurrence for her.  TSH/B12 normal.  HIV and RPR declined.   Mini-Mental status exam was 25 out of 30 at last visit but patient with fourth and fourth grade education in [place] and never had comparable education he  Patient also experienced a vague feeling of imbalance in her head and had also noted faint sensation of electricity and scalp when she lays down-split-second.  Patient had prior brain MRI in September 2014 with mention of prior possible ischemia versus infection versus trauma in right temporal lobe.  Most recent MRI on 09/17/2020 "IMPRESSION: 1. No evidence of acute intracranial abnormality. 2. Mild for age chronic microvascular ischemic disease and generalized cerebral volume loss. Similar rounded areas of T2/FLAIR hyperintensity in the anterior right temporal lobe, which may represent the sequela chronic microvascular ischemia or prior insult. 3. Remote right cerebellar infarct." A/P: We had a long discussion today about prior MRI results and that she may have had a prior stroke in 2014 but clearly has had a stroke in her cerebellum since that time- this could certainly cause some imbalance issues and affect her memory-could have slight vascular mediated memory loss or potential dementia. On one hand this is discouraging-on the other hand if we reduce risk factors but hopefully we can prevent progression. See stroke discussion below  I cannot fully explain electricity sensation she is having her scalp but I am thankful it is brief and she has had an overall reassuring MRI in regards to no mass which was my primary concern  In regards to memory loss- son is very helpful today-he has not noted severe memory changes but more so significant stress after loss of his father/patient's husband in August 2021- we discussed possible referral to neurology but ultimately patient/family opted to recheck memory within 6 months and see if any further decline particularly since some of this could be accounted for after prior stroke  #History of  stroke-possibly causing imbalance issues with cerebellar stroke #Hyperlipidemia S: Based on prior right cerebellar infarct not noted 06/02/2013 we opted to start aspirin 81 mg and start rosuvastatin 5 mg with goal of pushing LDL below 70-most recently at 93 Lab Results  Component Value Date   CHOL 171 06/11/2020   HDL 65 06/11/2020   LDLCALC 93 06/11/2020   LDLDIRECT 127.5 12/20/2012   TRIG 50 06/11/2020   CHOLHDL 2.6 06/11/2020  A/P: Patient with history of hyperlipidemia though very mild with LDL at 93- with stroke history now we opted to push to get this under 70-starting with Crestor 5 mg-likely recheck CMP as well as direct LDL at follow-up.  With history of stroke in the cerebellum we will also continue aspirin 81 mg which we started after last phone call  Recommended follow up: We opted to keep March visit Future Appointments  Date Time Provider [place]  12/11/2020  8:20 AM Marin Olp, MD [facility]  02/13/2021  8:45 AM [name], MD [facility]  09/19/2021  8:45 AM [facility]    Lab/Order associations:   ICD-10-CM   1. History of cerebellar stroke  Z86.73   2. Memory loss  R41.3   3. Mild hyperlipidemia  E78.5    Time Spent: 34 minutes of total time (2:39 PM-3:06 PM, 10:30 PM-10:37 PM) was spent on the date of the encounter performing the following actions: chart review prior to seeing the patient, obtaining history, performing a medically necessary exam, counseling on the treatment plan, placing orders, and documenting in our EHR.   Return precautions advised.  Garret Reddish, MD

## 2020-10-04 NOTE — Patient Instructions (Addendum)
Health Maintenance Due  Topic Date Due  . COVID-19 Vaccine (3 - Pfizer risk 4-dose series) please consider 12/03/2019   With prior stroke that we are now aware of 1. Continue aspirin 81 mg 2. Continue crestor 5 mg  See you in march

## 2020-10-05 ENCOUNTER — Encounter: Payer: Self-pay | Admitting: Family Medicine

## 2020-10-05 ENCOUNTER — Ambulatory Visit (INDEPENDENT_AMBULATORY_CARE_PROVIDER_SITE_OTHER): Payer: Medicare Other | Admitting: Family Medicine

## 2020-10-05 DIAGNOSIS — R413 Other amnesia: Secondary | ICD-10-CM | POA: Diagnosis not present

## 2020-10-05 DIAGNOSIS — E785 Hyperlipidemia, unspecified: Secondary | ICD-10-CM

## 2020-10-05 DIAGNOSIS — Z8673 Personal history of transient ischemic attack (TIA), and cerebral infarction without residual deficits: Secondary | ICD-10-CM | POA: Diagnosis not present

## 2020-11-01 ENCOUNTER — Telehealth (INDEPENDENT_AMBULATORY_CARE_PROVIDER_SITE_OTHER): Payer: Medicare Other | Admitting: Family Medicine

## 2020-11-01 ENCOUNTER — Other Ambulatory Visit: Payer: Medicare Other

## 2020-11-01 DIAGNOSIS — R059 Cough, unspecified: Secondary | ICD-10-CM

## 2020-11-01 DIAGNOSIS — R0981 Nasal congestion: Secondary | ICD-10-CM

## 2020-11-01 MED ORDER — BENZONATATE 100 MG PO CAPS: 100.0000 mg | ORAL_CAPSULE | Freq: Three times a day (TID) | ORAL | 0 refills | Status: DC | PRN

## 2020-11-01 NOTE — Progress Notes (Signed)
Virtual Visit via Telephone Note  I connected with Cindy Schwartz on 11/01/20 at  5:20 PM EST by telephone and verified that I am speaking with the correct person using two identifiers.   I discussed the limitations, risks, security and privacy concerns of performing an evaluation and management service by telephone and the availability of in person appointments. I also discussed with the patient that there may be a patient responsible charge related to this service. The patient expressed understanding and agreed to proceed.  Location patient: home, [place] Location provider: work or home office Participants present for the call: patient, provider Patient did not have a visit with me in the prior 7 days to address this/these issue(s).   History of Present Illness:  Acute telemedicine visit for ? Sinus infection: -Onset: last 2 days -Symptoms include: nasal congestion, now with some discomfort in the nose, clear nasal congestion, occ cough, scratchy throat  -Denies: fever, CP, SOB, malaise, body aches, NVD, known sick contacts - son had covid 3 weeks ago and he visited last weekend but was out of quarantine -Pertinent past medical history: chronic nasal congestion - but no medications work per her report -Pertinent medication allergies:chlorhexidine -COVID-19 vaccine status: had 2 doses of covid vaccine and flu shote   Observations/Objective: Patient sounds cheerful and well on the phone. I do not appreciate any SOB. Speech and thought processing are grossly intact. Patient reported vitals:  Assessment and Plan:  Nasal congestion  Cough  -we discussed possible serious and likely etiologies, options for evaluation and workup, limitations of telemedicine visit vs in person visit, treatment, treatment risks and precautions. Pt prefers to treat via telemedicine empirically rather than in person at this moment. Query VURI, possible covid vs other. She is getting a covid test and agrees to  schedule follow-up visit if possible through primary care office to discuss treatment options.  In the interim, nasal saline, Tessalon Rx for cough sent per her request, and other symptomatic care measures summarized in patient instructions.  Advised to seek prompt in person care if worsening, new symptoms arise, or if is not improving with treatment. Advised of options for inperson care in case PCP office not available. Did let the patient know that I only do telemedicine shifts for [place] on Tuesdays and Thursdays and advised a follow up visit with PCP or at an [facility] if has further questions or concerns.   Follow Up Instructions:  I did not refer this patient for an OV with me in the next 24 hours for this/these issue(s).  I discussed the assessment and treatment plan with the patient. The patient was provided an opportunity to ask questions and all were answered. The patient agreed with the plan and demonstrated an understanding of the instructions.   I spent 18 minutes on the date of this visit in the care of this patient. See summary of tasks completed to properly care for this patient in the detailed notes above which also included counseling of above, review of PMH, medications, allergies, evaluation of the patient and ordering and/or  instructing patient on testing and care options.     Lucretia Kern, DO

## 2020-11-01 NOTE — Patient Instructions (Addendum)
  HOME CARE TIPS:  -[place] testing information: https://www.[email or web address]/ OR [phone] Most pharmacies also offer testing and home test kits. Please schedule follow up video visit through your primary care office if you have a positive test and wish to discuss possible referral for treatment. Treatment   -I sent the medication(s) we discussed to your pharmacy: Meds ordered this encounter  Medications  . benzonatate (TESSALON PERLES) 100 MG capsule    Sig: Take 1 capsule (100 mg total) by mouth 3 (three) times daily as needed.    Dispense:  20 capsule    Refill:  0     -can use tylenol or aleve if needed for fevers, aches and pains per instructions  -can use nasal saline a few times per day if you have nasal congestion  -stay hydrated, drink plenty of fluids and eat small healthy meals - avoid dairy  -can take 1000 IU (34mcg) Vit D3 and 100-500 mg of Vit C daily per instructions  -If the Covid test is positive, check out the CDC website for more information on home care, transmission and treatment for [place]  -stay home while sick, except to seek medical care, and if you have COVID19 ideally it would be best to stay home for a full 10 days since the onset of symptoms PLUS one day of no fever and feeling better. Wear a good mask (such as N95 or KN95) if around others to reduce the risk of transmission.  It was nice to meet you today, and I really hope you are feeling better soon. I help [place] out with telemedicine visits on Tuesdays and Thursdays and am available for visits on those days. If you have any concerns or questions following this visit please schedule a follow up visit with your Primary Care doctor or seek care at a local urgent care clinic to avoid delays in care.    Seek in person care or schedule a follow up video visit promptly if your symptoms worsen, new concerns arise or you are not improving with treatment. Call 911  and/or seek emergency care if your symptoms are severe or life threatening.

## 2020-12-07 NOTE — Patient Instructions (Addendum)
  Health Maintenance Due  Topic Date Due  . COVID-19 Vaccine- please consider this- I think it would lower your overall risk. Check with pharmacy 12/03/2019   Talk with your pharmacist- I would recommend getting Tdap vaccination (not just Td vaccination) because Tdap protects from whooping cough as well- cheaper to get it there than here  Please check with your pharmacy to see if they have the shingrix vaccine. If they do- please get this immunization and update [place] by phone call or mychart with dates you receive the vaccine  Please stop by lab before you go If you have mychart- we will send your results within 3 business days of [place] receiving them.  If you do not have mychart- we will call you about results within 5 business days of [place] receiving them.  *please also note that you will see labs on mychart as soon as they post. I will later go in and write notes on them- will say "notes from Dr. Yong Channel"  Schedule visit with physical therapy for right low back pain before you leave. If not making progress with them we could consider sports medicine referral. Let me know if new or worsening symptoms.   Try to be consistent with aspirin   Recommended follow up: Return in about 6 months (around 06/13/2021) for physical or sooner if needed.

## 2020-12-07 NOTE — Progress Notes (Signed)
Phone [phone] In person visit   Subjective:   Cindy Schwartz is a 87 y.o. year old very pleasant female patient who presents for/with See problem oriented charting Chief Complaint  Patient presents with  . Hyperlipidemia  . Osteoporosis  . Anxiety   This visit occurred during the SARS-CoV-2 public health emergency.  Safety protocols were in place, including screening questions prior to the visit, additional usage of staff PPE, and extensive cleaning of exam room while observing appropriate contact time as indicated for disinfecting solutions.   Past Medical History-  Patient Active Problem List   Diagnosis Date Noted  . History of cerebellar stroke 10/05/2020    Priority: High  . Memory loss 10/05/2020    Priority: High  . history of Breast cancer of lower-outer quadrant of right female breast  01/16/2016    Priority: High  . Lower abdominal pain 06/28/2010    Priority: High  . Aneurysm of thoracic aorta ([place]) 05/06/2007    Priority: High  . History of ovarian cancer 09/22/1977    Priority: High  . History of adenomatous polyp of colon 12/30/2019    Priority: Medium  . Mild hyperlipidemia 06/01/2019    Priority: Medium  . Chronic cough 01/21/2018    Priority: Medium  . Normal coronary arteries 10/17/2014    Priority: Medium  . Hx of herpes simplex infection 12/18/2011    Priority: Medium  . Esophageal reflux 05/06/2007    Priority: Medium  . Osteoporosis 03/18/2007    Priority: Medium  . Degenerative arthritis of knee, bilateral 04/08/2019    Priority: Low  . Chronic idiopathic constipation 03/01/2019    Priority: Low  . Genetic testing 07/09/2017    Priority: Low  . Palpitations 11/13/2014    Priority: Low  . Varicose veins of lower extremities with other complications 76/72/0947    Priority: Low  . Low back pain 06/28/2010    Priority: Low  . Generalized anxiety disorder 10/13/2008    Priority: Low  . Irritable bowel syndrome 10/13/2008    Priority: Low   . Lung nodule 11/25/2007    Priority: Low  . Raynaud's syndrome 07/01/2007    Priority: Low  . Allergic rhinitis 05/06/2007    Priority: Low  . DEGENERATIVE DISC DISEASE, CERVICAL SPINE 05/06/2007    Priority: Low    Medications- reviewed and updated Current Outpatient Medications  Medication Sig Dispense Refill  . Ascorbic Acid (VITAMIN C) 500 MG CAPS Take 1 tablet by mouth daily.    Marland Kitchen CALCIUM PO Take 1,000 mg by mouth.    Marland Kitchen CALCIUM-MAG-VIT C-VIT D PO Take 15 mLs by mouth daily.    . Cholecalciferol (VITAMIN D3) 1000 units CAPS Take 1,000 Units by mouth daily.    Marland Kitchen esomeprazole (NEXIUM) 20 MG capsule Take 20 mg by mouth daily at 12 noon.    Marland Kitchen ipratropium (ATROVENT) 0.06 % nasal spray Place 2 sprays into both nostrils 2 (two) times daily.    Marland Kitchen MAGNESIUM PO Take 300 mg by mouth.    . rosuvastatin (CRESTOR) 5 MG tablet Take 1 tablet (5 mg total) by mouth daily. Take 1 tablet daily by mouth. 90 tablet 3  . Turmeric 500 MG TABS 1 capsule    . aspirin EC 81 MG tablet Take 1 tablet (81 mg total) by mouth daily. Swallow whole. (Patient not taking: Reported on 12/11/2020) 90 tablet 3  . valACYclovir (VALTREX) 500 MG tablet Take 2 tablets (1,000 mg total) by mouth as needed. 90 tablet 1  No current facility-administered medications for this visit.     Objective:  BP 128/78   Pulse 62   Temp 97.7 F (36.5 C) (Temporal)   Ht 5\' 5"  (1.651 m)   Wt 140 lb 3.2 oz (63.6 kg)   LMP  (LMP Unknown)   SpO2 99%   BMI 23.33 kg/m  Gen: NAD, resting comfortably CV: RRR no murmurs rubs or gallops Lungs: CTAB no crackles, wheeze, rhonchi Abdomen: soft/nontender/nondistended/normal bowel sounds. Ext: no edema Skin: warm, dry Back - Normal skin, Spine with normal alignment and no deformity.  No tenderness to vertebral process palpation.  Paraspinous muscles are not tender and without spasm.   Range of motion is full at neck and lumbar sacral regions. Negative Straight leg raise.  Neuro- no saddle  anesthesia, 5/5 strength lower extremities. Points to pain in right low back but not painful with palpation     Assessment and Plan   #History of cerebellar stroke with some balance and memory issues/hyperlipidemia S: Patient complained of issues with memory loss-on MRI of brain 09/17/2020-"Remote right cerebellar infarct."  This was not noted on prior MRI in 2014.  We started aspirin 81 mg (misses some doses) as well as rosuvastatin 5 mg daily with LDL goal less than 70  Memory and balance issues could be associated with prior stroke.  Some of memory concerns could be related to stress after loss of husband in August 2021 A/P: no evidence of recurrent stroke- advised her to take aspirin regularly if possible though will get some benefit even from consistent use. For hyperlipidemia- update  LDL and if not at goal consider cange in med- for now continue current meds   #%- Aneurysm of thoracic aorta S: Gets MRI/MRI with Dr. [name] every other year-scheduled in March typically for this (last march 2020).  Blood pressure goal less than 130/80 -Patient with normal coronary arteries in the past but still follows with Dr. Johnsie Cancel every 6 months- palpitations with PVCs/PACs in the past A/P: Blood pressure at goal today.  Patient will call to schedule follow-up with Dr. Judeth Cornfield seen May 2020- last imaging march 2020- so now due.  -has upcoming visit with Dr. Johnsie Cancel- states palpitations have not been bad lately   #% Esophageal reflux/history of chronic cough- has seen Dr. Chase Caller in the past S: Compliant with Nexium every day 20 mg A/P: reasonable control- continue current meds. We have tried to reduce in past and not been successful- previously on every other day now back to daily    #Osteoporosis S: Patient on calcium and vitamin D.  Took Fosamax for years.  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  DEXA October 2018 showed worst T score at -3.0 lumbar  spine. On different machine in 07/2019 worst t score - 2.2 RFN and LFN -2020 declined Prolia -does take calcium and vitamin D and have encouraged weight bearing exercise  -Nexium not ideal for osteoporosis but have not been able to get her off of this  A/P: hopefully stable- repeat bone density likely late 2022 or 2023  -getting some pain in right low back with walking- if walks again bothers her again. No midline pain. No lateral hip pain. No groin pain. Suspect OA of low back- has old compression fracture at L1 and signs of arthritis as well- will refer to physical therapy  # recent flare up of herpes simplex- needs refill on valtrex. Using daily at this point  Recommended follow up: Return  in about 6 months (around 06/13/2021) for physical or sooner if needed. Future Appointments  Date Time Provider [place]  02/13/2021  8:45 AM [name], MD [facility]  09/19/2021  8:45 AM [facility]    Lab/Order associations:   ICD-10-CM   1. Mild hyperlipidemia  E78.5 Comprehensive metabolic panel    LDL cholesterol, direct  2. Generalized anxiety disorder  F41.1   3. Gastroesophageal reflux disease without esophagitis  K21.9   4. Other osteoporosis, unspecified pathological fracture presence  M81.8   5. History of cerebellar stroke  Z86.73   6. Chronic right-sided low back pain without sciatica  M54.50 Ambulatory referral to Physical Therapy   G89.29   7. History of compression fracture of spine  Z87.81 Ambulatory referral to Physical Therapy  8. Thoracic aortic aneurysm without rupture (HCC) Chronic I71.2     No orders of the defined types were placed in this encounter.  Return precautions advised.  Garret Reddish, MD

## 2020-12-11 ENCOUNTER — Other Ambulatory Visit: Payer: Self-pay

## 2020-12-11 ENCOUNTER — Encounter: Payer: Self-pay | Admitting: Family Medicine

## 2020-12-11 ENCOUNTER — Ambulatory Visit (INDEPENDENT_AMBULATORY_CARE_PROVIDER_SITE_OTHER): Payer: Medicare Other | Admitting: Family Medicine

## 2020-12-11 VITALS — BP 128/78 | HR 62 | Temp 97.7°F | Ht 65.0 in | Wt 140.2 lb

## 2020-12-11 DIAGNOSIS — E785 Hyperlipidemia, unspecified: Secondary | ICD-10-CM | POA: Diagnosis not present

## 2020-12-11 DIAGNOSIS — Z8781 Personal history of (healed) traumatic fracture: Secondary | ICD-10-CM

## 2020-12-11 DIAGNOSIS — I712 Thoracic aortic aneurysm, without rupture, unspecified: Secondary | ICD-10-CM

## 2020-12-11 DIAGNOSIS — K219 Gastro-esophageal reflux disease without esophagitis: Secondary | ICD-10-CM

## 2020-12-11 DIAGNOSIS — M545 Low back pain, unspecified: Secondary | ICD-10-CM

## 2020-12-11 DIAGNOSIS — M818 Other osteoporosis without current pathological fracture: Secondary | ICD-10-CM | POA: Diagnosis not present

## 2020-12-11 DIAGNOSIS — G8929 Other chronic pain: Secondary | ICD-10-CM

## 2020-12-11 DIAGNOSIS — F411 Generalized anxiety disorder: Secondary | ICD-10-CM

## 2020-12-11 DIAGNOSIS — Z8673 Personal history of transient ischemic attack (TIA), and cerebral infarction without residual deficits: Secondary | ICD-10-CM

## 2020-12-11 LAB — COMPREHENSIVE METABOLIC PANEL
ALT: 13 U/L (ref 0–35)
AST: 18 U/L (ref 0–37)
Albumin: 4.3 g/dL (ref 3.5–5.2)
Alkaline Phosphatase: 57 U/L (ref 39–117)
BUN: 16 mg/dL (ref 6–23)
CO2: 31 mEq/L (ref 19–32)
Calcium: 9.6 mg/dL (ref 8.4–10.5)
Chloride: 97 mEq/L (ref 96–112)
Creatinine, Ser: 0.6 mg/dL (ref 0.40–1.20)
GFR: 82.1 mL/min (ref >60.00)
Glucose, Bld: 81 mg/dL (ref 70–99)
Potassium: 4.4 mEq/L (ref 3.5–5.1)
Sodium: 133 mEq/L — ABNORMAL LOW (ref 135–145)
Total Bilirubin: 0.7 mg/dL (ref 0.2–1.2)
Total Protein: 6.7 g/dL (ref 6.0–8.3)

## 2020-12-11 LAB — LDL CHOLESTEROL, DIRECT: Direct LDL: 56 mg/dL

## 2020-12-11 MED ORDER — VALACYCLOVIR HCL 500 MG PO TABS: 1000.0000 mg | ORAL_TABLET | ORAL | 3 refills | Status: DC | PRN

## 2020-12-11 MED ORDER — VALACYCLOVIR HCL 500 MG PO TABS: 1000.0000 mg | ORAL_TABLET | ORAL | 1 refills | Status: DC | PRN

## 2020-12-18 NOTE — Progress Notes (Signed)
Phone [phone] In person visit   Subjective:   Cindy Schwartz is a 87 y.o. year old very pleasant female patient who presents for/with See problem oriented charting Chief Complaint  Patient presents with  . Sinus Problem    Nasal Congestion, Post nasal drip , head pressure in the front of her head.   . Sore Throat    Patient states her throat doesn't hurt but on the left side it feels irritated and raw   . Generalized Body Aches    Her body doesn't feel right and her legs are aching.    This visit occurred during the SARS-CoV-2 public health emergency.  Safety protocols were in place, including screening questions prior to the visit, additional usage of staff PPE, and extensive cleaning of exam room while observing appropriate contact time as indicated for disinfecting solutions.   Past Medical History-  Patient Active Problem List   Diagnosis Date Noted  . History of cerebellar stroke 10/05/2020    Priority: High  . Memory loss 10/05/2020    Priority: High  . history of Breast cancer of lower-outer quadrant of right female breast  01/16/2016    Priority: High  . Lower abdominal pain 06/28/2010    Priority: High  . Aneurysm of thoracic aorta ([place]) 05/06/2007    Priority: High  . History of ovarian cancer 09/22/1977    Priority: High  . History of adenomatous polyp of colon 12/30/2019    Priority: Medium  . Mild hyperlipidemia 06/01/2019    Priority: Medium  . Chronic cough 01/21/2018    Priority: Medium  . Normal coronary arteries 10/17/2014    Priority: Medium  . Hx of herpes simplex infection 12/18/2011    Priority: Medium  . Esophageal reflux 05/06/2007    Priority: Medium  . Osteoporosis 03/18/2007    Priority: Medium  . Degenerative arthritis of knee, bilateral 04/08/2019    Priority: Low  . Chronic idiopathic constipation 03/01/2019    Priority: Low  . Genetic testing 07/09/2017    Priority: Low  . Palpitations 11/13/2014    Priority: Low  . Varicose  veins of lower extremities with other complications [id number]/5573    Priority: Low  . Low back pain 06/28/2010    Priority: Low  . Generalized anxiety disorder 10/13/2008    Priority: Low  . Irritable bowel syndrome 10/13/2008    Priority: Low  . Lung nodule 11/25/2007    Priority: Low  . Raynaud's syndrome 07/01/2007    Priority: Low  . Allergic rhinitis 05/06/2007    Priority: Low  . DEGENERATIVE DISC DISEASE, CERVICAL SPINE 05/06/2007    Priority: Low    Medications- reviewed and updated Current Outpatient Medications  Medication Sig Dispense Refill  . amoxicillin-clavulanate (AUGMENTIN) 875-125 MG tablet Take 1 tablet by mouth 2 (two) times daily for 7 days. 14 tablet 0  . Ascorbic Acid (VITAMIN C) 500 MG CAPS Take 1 tablet by mouth daily.    Marland Kitchen aspirin EC 81 MG tablet Take 1 tablet (81 mg total) by mouth daily. Swallow whole. 90 tablet 3  . CALCIUM PO Take 1,000 mg by mouth.    Marland Kitchen CALCIUM-MAG-VIT C-VIT D PO Take 15 mLs by mouth daily.    . Cholecalciferol (VITAMIN D3) 1000 units CAPS Take 1,000 Units by mouth daily.    Marland Kitchen esomeprazole (NEXIUM) 20 MG capsule Take 20 mg by mouth daily at 12 noon.    Marland Kitchen ipratropium (ATROVENT) 0.06 % nasal spray Place 2 sprays into both nostrils 2 (  two) times daily.    Marland Kitchen MAGNESIUM PO Take 300 mg by mouth.    . rosuvastatin (CRESTOR) 5 MG tablet Take 1 tablet (5 mg total) by mouth daily. Take 1 tablet daily by mouth. 90 tablet 3  . valACYclovir (VALTREX) 500 MG tablet Take 2 tablets (1,000 mg total) by mouth as needed. 90 tablet 3  . Turmeric 500 MG TABS 1 capsule (Patient not taking: Reported on 12/20/2020)     No current facility-administered medications for this visit.     Objective:  BP 112/69   Pulse 74   Temp 97.8 F (36.6 C) (Oral)   Ht 5\' 5"  (1.651 m)   LMP  (LMP Unknown)   SpO2 98%   BMI 23.33 kg/m  Gen: NAD, appears fatigued but not in extremis TM normal, pharynx with some drainage, left frontal and maxillary sinus particularly  tender CV: RRR no murmurs rubs or gallops Lungs: CTAB no crackles, wheeze, rhonchi Abdomen: soft/nontender/nondistended/normal bowel sounds.  Ext: no edema Skin: warm, dry    Assessment and Plan   # Sinus pressure S: Patient started with symptoms [birth year] her [birth year]. Has felt a lot of sinus pressure in frontal and maxillary sinuses- denies headache. Throat bothered her at first but advil was helpful as well as tea. Also took extra vitamin C. Sunday did not go to church as did not feel well. Also has taken some benadryl.  Was noting some improvement for a few days and then yesterday started to worsen again- more pressure in sinuses- worse in left maxillary- perhaps a few more tears on left side. Left throat bothering her more. Most of drainage goes down throat- not blowing much out of her nose. Very slight wheeze and perhaps very mild chest congestoin/fullness sensation but focus of symptoms in sinuses. No exertional chest pain. Not short of breath. Some dental sensitivity on top. Also with some body aches and cold sensation  Home test negative for covid A/P: 87 year old female with sinus pressure  With double sickening and 6 days duration. Symptoms concerning for bacterial sinusitis-we will treat with Augmentin twice a day for 7 days-he should take this with food.  If she fails to improve by Monday I would like for her to let us know or certainly if symptoms worsen.  With body aches and sinus congestion we also opted to check for flu and COVID-19 but I think the chances of this is rather low-I do want her to be somewhat careful until we get the negative Covid test back-see instructions below  Patient with symptoms concerning for potential covid 19 -Therefore tested today -recommended patient consider purchasing pulse oximeter and if levels 94% or below persistently- seek care at the hospital  -recommended self isolation/quarantine until negative test  at minimum .  -Hopeful results  back within 48 hours of test but may take up to a week  -if new or worsening symptoms let us know  Recommended follow up: as needed for acute concerns Future Appointments  Date Time Provider [place]  02/13/2021  8:45 AM [name], MD [facility]  06/26/2021  8:00 AM Marin Olp, MD [facility]  09/19/2021  8:45 AM [facility]    Lab/Order associations:   ICD-10-CM   1. Bacterial sinusitis  J32.9    B96.89   2. Body aches  R52 Novel Coronavirus, NAA ([facility])    Meds ordered this encounter  Medications  . amoxicillin-clavulanate (AUGMENTIN) 875-125 MG tablet  Sig: Take 1 tablet by mouth 2 (two) times daily for 7 days.    Dispense:  14 tablet    Refill:  0   Return precautions advised.  Garret Reddish, MD

## 2020-12-18 NOTE — Patient Instructions (Addendum)
Health Maintenance Due  Topic Date Due  . TETANUS/TDAP Patient states that the pharmacy is ordering this for her.  09/22/2013   Symptoms concerning for bacterial sinusitis-we will treat with Augmentin twice a day for 7 days-he should take this with food.  If she fails to improve by Monday I would like for her to let us know or certainly if symptoms worsen.  With body aches and sinus congestion we also opted to check for flu and COVID-19 but I think the chances of this is rather low-I do want her to be somewhat careful until we get the negative Covid test back-see instructions below  Patient with symptoms concerning for potential covid 19 -Therefore tested today -recommended patient consider purchasing pulse oximeter and if levels 94% or below persistently- seek care at the hospital  -recommended self isolation/quarantine until negative test  at minimum .  -Hopeful results back within 48 hours of test but may take up to a week  -if new or worsening symptoms let us know

## 2020-12-20 ENCOUNTER — Encounter: Payer: Self-pay | Admitting: Family Medicine

## 2020-12-20 ENCOUNTER — Other Ambulatory Visit: Payer: Self-pay

## 2020-12-20 ENCOUNTER — Ambulatory Visit: Payer: Medicare Other | Admitting: Physical Therapy

## 2020-12-20 ENCOUNTER — Ambulatory Visit (INDEPENDENT_AMBULATORY_CARE_PROVIDER_SITE_OTHER): Payer: Medicare Other | Admitting: Family Medicine

## 2020-12-20 VITALS — BP 112/69 | HR 74 | Temp 97.8°F | Ht 65.0 in

## 2020-12-20 DIAGNOSIS — J329 Chronic sinusitis, unspecified: Secondary | ICD-10-CM | POA: Diagnosis not present

## 2020-12-20 DIAGNOSIS — R52 Pain, unspecified: Secondary | ICD-10-CM | POA: Diagnosis not present

## 2020-12-20 DIAGNOSIS — B9689 Other specified bacterial agents as the cause of diseases classified elsewhere: Secondary | ICD-10-CM | POA: Diagnosis not present

## 2020-12-20 LAB — POCT INFLUENZA A/B
Influenza A, POC: NEGATIVE
Influenza B, POC: NEGATIVE

## 2020-12-20 MED ORDER — AMOXICILLIN-POT CLAVULANATE 875-125 MG PO TABS: 1.0000 | ORAL_TABLET | Freq: Two times a day (BID) | ORAL | 0 refills | Status: AC

## 2020-12-22 LAB — NOVEL CORONAVIRUS, NAA: SARS-CoV-2, NAA: NOT DETECTED

## 2020-12-22 LAB — SARS-COV-2, NAA 2 DAY TAT

## 2020-12-25 ENCOUNTER — Encounter: Payer: Self-pay | Admitting: Podiatry

## 2020-12-25 ENCOUNTER — Ambulatory Visit (INDEPENDENT_AMBULATORY_CARE_PROVIDER_SITE_OTHER): Payer: Medicare Other | Admitting: Podiatry

## 2020-12-25 ENCOUNTER — Other Ambulatory Visit: Payer: Self-pay

## 2020-12-25 DIAGNOSIS — M79674 Pain in right toe(s): Secondary | ICD-10-CM

## 2020-12-25 DIAGNOSIS — M79675 Pain in left toe(s): Secondary | ICD-10-CM | POA: Diagnosis not present

## 2020-12-25 DIAGNOSIS — B351 Tinea unguium: Secondary | ICD-10-CM | POA: Diagnosis not present

## 2020-12-25 NOTE — Progress Notes (Signed)
This patient presents to the office with chief complaint of long thick painful  big toe nails.  Patient says the nails are painful walking and wearing shoes.  This patient is unable to self treat.  This patient is unable to trim her nails since she is unable to reach her nails.  She says her inside border left big toe is painful.    She presents to the office for preventative foot care services.  General Appearance  Alert, conversant and in no acute stress.  Vascular  Dorsalis pedis and posterior tibial  pulses are palpable  bilaterally.  Capillary return is within normal limits  bilaterally. Temperature is within normal limits  bilaterally.  Neurologic  Senn-Weinstein monofilament wire test within normal limits  bilaterally. Muscle power within normal limits bilaterally.  Nails Thick disfigured discolored nails with subungual debris  Hallux nails both feet especially her left big toe.. No evidence of bacterial infection or drainage bilaterally.  Orthopedic  No limitations of motion  feet .  No crepitus or effusions noted.  No bony pathology or digital deformities noted.  Skin  normotropic skin with no porokeratosis noted bilaterally.  No signs of infections or ulcers noted.     Onychomycosis  Nails  B/L.  Pain in right toes  Pain in left toes  Debridement of nails both feet with nail nipper  Followed with treatment with dremel tool.  Discussed possible nail surgery for her painful nail.  RTC 3 months.   Gardiner Barefoot DPM

## 2020-12-26 ENCOUNTER — Other Ambulatory Visit: Payer: Self-pay | Admitting: Obstetrics and Gynecology

## 2020-12-26 DIAGNOSIS — N644 Mastodynia: Secondary | ICD-10-CM

## 2021-01-01 ENCOUNTER — Other Ambulatory Visit (INDEPENDENT_AMBULATORY_CARE_PROVIDER_SITE_OTHER): Payer: Medicare Other

## 2021-01-01 ENCOUNTER — Other Ambulatory Visit: Payer: Self-pay

## 2021-01-01 ENCOUNTER — Telehealth: Payer: Self-pay

## 2021-01-01 ENCOUNTER — Other Ambulatory Visit: Payer: Self-pay | Admitting: Surgery

## 2021-01-01 DIAGNOSIS — I712 Thoracic aortic aneurysm, without rupture, unspecified: Secondary | ICD-10-CM

## 2021-01-01 DIAGNOSIS — R6889 Other general symptoms and signs: Secondary | ICD-10-CM

## 2021-01-01 DIAGNOSIS — R6883 Chills (without fever): Secondary | ICD-10-CM

## 2021-01-01 NOTE — Telephone Encounter (Signed)
Called and left patient a voicemail to return call.

## 2021-01-01 NOTE — Telephone Encounter (Signed)
Patient came by the office she stated she was still not feeling well patient stated she finished her antibiotics and she felt better for a few days. Patient wants to know if something else could be sent in or if labs could be drawn to see what is going on.

## 2021-01-01 NOTE — Telephone Encounter (Signed)
Labs has been placed. Patient coming into the office today for blood work

## 2021-01-01 NOTE — Telephone Encounter (Signed)
Patient states after she finished the antibiotic she felt okay for a couple days, but now she feels cold all the time, her legs and back hurt. Just feels like she is not herself. Wonders if she is fighting some sort of infection, sometimes she has infections and does not know feels they are "silent" infections that show no "real" symptoms she just feels off. Wants to know if she could have some labs done to find a possible infection if needed. Denies any sinus symptoms.

## 2021-01-01 NOTE — Telephone Encounter (Signed)
Can order cbc diff, cmp under chills and tsh under cold intolerance for her.

## 2021-01-02 LAB — CBC WITH DIFFERENTIAL/PLATELET
Basophils Absolute: 0.1 10*3/uL (ref 0.0–0.1)
Basophils Relative: 1 % (ref 0.0–3.0)
Eosinophils Absolute: 0.3 10*3/uL (ref 0.0–0.7)
Eosinophils Relative: 4.8 % (ref 0.0–5.0)
HCT: 35.6 % — ABNORMAL LOW (ref 36.0–46.0)
Hemoglobin: 12 g/dL (ref 12.0–15.0)
Lymphocytes Relative: 27 % (ref 12.0–46.0)
Lymphs Abs: 1.8 10*3/uL (ref 0.7–4.0)
MCHC: 33.7 g/dL (ref 30.0–36.0)
MCV: 85.3 fl (ref 78.0–100.0)
Monocytes Absolute: 0.7 10*3/uL (ref 0.1–1.0)
Monocytes Relative: 10.2 % (ref 3.0–12.0)
Neutro Abs: 3.9 10*3/uL (ref 1.4–7.7)
Neutrophils Relative %: 57 % (ref 43.0–77.0)
Platelets: 261 10*3/uL (ref 150.0–400.0)
RBC: 4.17 Mil/uL (ref 3.87–5.11)
RDW: 14 % (ref 11.5–15.5)
WBC: 6.8 10*3/uL (ref 4.0–10.5)

## 2021-01-02 LAB — COMPREHENSIVE METABOLIC PANEL
ALT: 20 U/L (ref 0–35)
AST: 22 U/L (ref 0–37)
Albumin: 3.7 g/dL (ref 3.5–5.2)
Alkaline Phosphatase: 64 U/L (ref 39–117)
BUN: 16 mg/dL (ref 6–23)
CO2: 28 mEq/L (ref 19–32)
Calcium: 9.5 mg/dL (ref 8.4–10.5)
Chloride: 97 mEq/L (ref 96–112)
Creatinine, Ser: 0.78 mg/dL (ref 0.40–1.20)
GFR: 69.44 mL/min (ref >60.00)
Glucose, Bld: 83 mg/dL (ref 70–99)
Potassium: 4.7 mEq/L (ref 3.5–5.1)
Sodium: 131 mEq/L — ABNORMAL LOW (ref 135–145)
Total Bilirubin: 0.3 mg/dL (ref 0.2–1.2)
Total Protein: 6.9 g/dL (ref 6.0–8.3)

## 2021-01-02 LAB — TSH: TSH: 0.8 u[IU]/mL (ref 0.35–4.50)

## 2021-01-30 ENCOUNTER — Other Ambulatory Visit: Payer: Medicare Other

## 2021-01-30 ENCOUNTER — Ambulatory Visit: Payer: Medicare Other | Admitting: Surgery

## 2021-02-04 ENCOUNTER — Ambulatory Visit
Admission: RE | Admit: 2021-02-04 | Discharge: 2021-02-04 | Disposition: A | Discharge status: Home / Self Care | Payer: Medicare Other | Source: Ambulatory Visit | Attending: Surgery | Admitting: Surgery

## 2021-02-04 ENCOUNTER — Other Ambulatory Visit: Payer: Self-pay

## 2021-02-04 DIAGNOSIS — I712 Thoracic aortic aneurysm, without rupture, unspecified: Secondary | ICD-10-CM

## 2021-02-04 MED ORDER — GADOBENATE DIMEGLUMINE 529 MG/ML IV SOLN
13.0000 mL | Freq: Once | INTRAVENOUS | Status: AC | PRN
  Administered 2021-02-04: 13 mL via INTRAVENOUS

## 2021-02-05 NOTE — Progress Notes (Signed)
Evaluation Performed:  Follow-up visit  Date:  02/05/2021   ID:  Cindy Schwartz, DOB [birth year], MRN [id number]  PCP:  Marin Olp, MD  Cardiologist:  Jenkins Rouge, MD   Electrophysiologist:  None   Chief Complaint:  Palpitations/ Dizziness  History of Present Illness:    87 y.o. with history of HTN, fusiform aortic root dilatation 4.1 cm.  Chest pain January 2016 with normal cardiac catheterization. Palpitations with monitor only showing PACs;/PVC;s  Breast cancer with mastectomy August 2018. Husband diagnosed with lung cancer at same time.   Has two grandson's one [name] married living in [place] and one [name] has been in [place] and [place] cooking with wife  Having some chronic back pain [name] primary ordered plan films 07/31/20 showed chronic stable L1 compression deformity  Echo 01/25/18 showed AV sclerosis EF 60-65% estimated PA pressure 34 mmHg  Husband of 63 years finally passes of ILD/Lung Cancer and pneumonia 05/14/20 She has been even more depressed     Past Medical History:  Diagnosis Date  . Aneurysm of ascending aorta ([place])    a. 4.2-4.3cm in 2014.  Marland Kitchen Anxiety   . Arthritis   . Breast cancer ([place]) 2018   right mastectomy  . Cancer ([place]) 1980   ovarian cancer  . Compression fracture of L1 lumbar vertebra ([facility])   . COMPRESSION FRACTURE, THORACIC VERTEBRA 07/24/2010   Qualifier: Diagnosis of  By: [name], [name]   . Elevated BP Transient 09/2014  . Genetic testing 07/09/2017   Multi-Cancer panel (83 genes) @ [facility] - No pathogenic mutations detected  . GERD (gastroesophageal reflux disease)   . H/O bone density study    2015  . H/O colonoscopy   . History of ovarian cancer    November 1979  . 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 Insomnia   . Irregular heart beat   . Normal coronary arteries Cath - 09/2014  . Osteoporosis   . Raynaud's disease    Past Surgical History:   Procedure Laterality Date  . ABDOMINAL HYSTERECTOMY  1979  . APPENDECTOMY    . BILATERAL SALPINGOOPHORECTOMY    . BREAST LUMPECTOMY    . COLONOSCOPY  02/2012  . LEFT HEART CATHETERIZATION WITH CORONARY ANGIOGRAM N/A 10/17/2014   Procedure: LEFT HEART CATHETERIZATION WITH CORONARY ANGIOGRAM;  Surgeon: [name], MD;  Location: [facility] CATH LAB;  Service: Cardiovascular;  Laterality: N/A;  . ovarian ca s/p taj/bso  1979  . SIMPLE MASTECTOMY WITH AXILLARY SENTINEL NODE BIOPSY Right 05/05/2017   Procedure: RIGHT TOTAL MASTECTOMY WITH RIGHT  AXILLARY SENTINEL NODE BIOPSY;  Surgeon: [name], MD;  Location: [facility];  Service: General;  Laterality: Right;  PECTORAL BLOCK     No outpatient medications have been marked as taking for the 02/13/21 encounter (Appointment) with [name], MD.     Allergies:   Chlorhexidine gluconate   Social History   Tobacco Use  . Smoking status: Never Smoker  . Smokeless tobacco: Never Used  Vaping Use  . Vaping Use: Never used  Substance Use Topics  . Alcohol use: No  . Drug use: No     Family Hx: The patient's family history includes Breast cancer (age of onset: 51) in her cousin; Colon polyps in her brother; Esophageal cancer (age of onset: 31) in her cousin; Healthy in her brother; Hip fracture in her mother; Other in her father.  ROS:   Please see  the history of present illness.     All other systems reviewed and are negative.   Prior CV studies:   The following studies were reviewed today:  Echo 01/25/18 Holter 09/25/15   Labs/Other Tests and Data Reviewed:    EKG  SR RBBB no acute changes   Recent Labs: 01/01/2021: ALT 20; BUN 16; Creatinine, Ser 0.78; Hemoglobin 12.0; Platelets 261.0; Potassium 4.7; Sodium 131; TSH 0.80   Recent Lipid Panel Lab Results  Component Value Date/Time   CHOL 171 06/11/2020 09:32 AM   TRIG 50 06/11/2020 09:32 AM   HDL 65 06/11/2020 09:32 AM   CHOLHDL 2.6 06/11/2020 09:32 AM   LDLCALC 93  06/11/2020 09:32 AM   LDLDIRECT 56.0 12/11/2020 08:53 AM    Wt Readings from Last 3 Encounters:  12/11/20 63.6 kg  10/05/20 63.9 kg  08/23/20 63.5 kg     Objective:    Vital Signs:  LMP  (LMP Unknown)    Depressed  Well nourished, well developed female in no acute distress. Skin warm and dry No tachypnea Post right mastectomy No JVP elevation No edema 2/6 SEM  Plus 3 PT/DP  ASSESSMENT & PLAN:     1. Dizziness/ Lightheadedness:  -non cardiac benign holter 09/25/15 normal exam and no CAD at cath 2016   2. H/o PVCs/PAC:  -benign PRN Inderal   3. Breast CA:   -Post right mastectomy Cindy Schwartz 05/05/17 F/U oncology   4. Stress:  -depression related to husbands death consoled f/u primary   5. Thoracic aortic aneurysm:  -followed by Dr. [name]. MRA 02/04/21 stable 4.2 cm similar To CT done December 2005 Given age not sure this needs to be imaged further   6. Murmur: - no change on exam AV sclerosis    7. Cholesterol:   -LDL 56 labs done 12/11/20  continue low dose crestor   8. Cough:  - Cyclic sees pulmonary continue flonase and anti reflux measures atrovent was added Suspect pollen and allergies making things worse currently F/U Dr Yong Channel  Medication Adjustments/Labs and Tests Ordered: Current medicines are reviewed at length with the patient today.  Concerns regarding medicines are outlined above.  Tests Ordered:  None   Medication Changes: No orders of the defined types were placed in this encounter.   Disposition:  Follow up in a year   Signed, Jenkins Rouge, MD  02/05/2021 4:33 PM    [facility]

## 2021-02-06 ENCOUNTER — Encounter: Payer: Self-pay | Admitting: Surgery

## 2021-02-06 ENCOUNTER — Ambulatory Visit (INDEPENDENT_AMBULATORY_CARE_PROVIDER_SITE_OTHER): Payer: Medicare Other | Admitting: Surgery

## 2021-02-06 ENCOUNTER — Other Ambulatory Visit: Payer: Self-pay

## 2021-02-06 VITALS — BP 115/70 | HR 86 | Resp 20 | Ht 65.0 in | Wt 140.0 lb

## 2021-02-06 DIAGNOSIS — I712 Thoracic aortic aneurysm, without rupture, unspecified: Secondary | ICD-10-CM

## 2021-02-07 ENCOUNTER — Ambulatory Visit: Admission: RE | Admit: 2021-02-07 | Payer: Medicare Other | Source: Ambulatory Visit

## 2021-02-07 ENCOUNTER — Ambulatory Visit
Admission: RE | Admit: 2021-02-07 | Discharge: 2021-02-07 | Disposition: A | Discharge status: Home / Self Care | Payer: Medicare Other | Source: Ambulatory Visit | Attending: Obstetrics and Gynecology | Admitting: Obstetrics and Gynecology

## 2021-02-07 DIAGNOSIS — N644 Mastodynia: Secondary | ICD-10-CM

## 2021-02-07 NOTE — Progress Notes (Signed)
HPI:  The patient is an 87 year old woman who returns for follow-up of a 4.2 cm fusiform ascending aortic aneurysm has been stable since her initial CTA of the chest in 2005.  She continues to feel well and denies any chest pain or shortness of breath.  Current Outpatient Medications  Medication Sig Dispense Refill  . Ascorbic Acid (VITAMIN C) 500 MG CAPS Take 1 tablet by mouth daily.    Marland Kitchen aspirin EC 81 MG tablet Take 1 tablet (81 mg total) by mouth daily. Swallow whole. 90 tablet 3  . CALCIUM PO Take 1,000 mg by mouth.    Marland Kitchen CALCIUM-MAG-VIT C-VIT D PO Take 15 mLs by mouth daily.    . Cholecalciferol (VITAMIN D3) 1000 units CAPS Take 1,000 Units by mouth daily.    Marland Kitchen esomeprazole (NEXIUM) 20 MG capsule Take 20 mg by mouth daily at 12 noon.    Marland Kitchen ipratropium (ATROVENT) 0.06 % nasal spray Place 2 sprays into both nostrils 2 (two) times daily.    Marland Kitchen MAGNESIUM PO Take 300 mg by mouth.    . rosuvastatin (CRESTOR) 5 MG tablet Take 1 tablet (5 mg total) by mouth daily. Take 1 tablet daily by mouth. 90 tablet 3  . Turmeric 500 MG TABS     . valACYclovir (VALTREX) 500 MG tablet Take 2 tablets (1,000 mg total) by mouth as needed. 90 tablet 3   No current facility-administered medications for this visit.     Physical Exam: BP 115/70 (BP Location: Left Arm, Patient Position: Sitting)   Pulse 86   Resp 20   Ht 5\' 5"  (1.651 m)   Wt 140 lb (63.5 kg)   LMP  (LMP Unknown)   SpO2 96% Comment: RA  BMI 23.30 kg/m  She looks well. Cardiac exam shows regular rate and rhythm with normal heart sounds.  There is no murmur. Lungs are clear.   Diagnostic Tests:   TECHNIQUE: Angiographic images of the chest were obtained using MRA technique with intravenous contrast.  CONTRAST:  53mL MULTIHANCE GADOBENATE DIMEGLUMINE 529 MG/ML IV SOLN  COMPARISON:  Chest MRI 12/06/2018; 12/10/2016; 12/09/2014; 11/30/2012; 12/10/2010; 11/24/2008  Chest CT-09/06/2004  FINDINGS: Vascular  Findings:  Stable mild uncomplicated fusiform aneurysmal dilatation of the ascending thoracic aorta with measurements as follows. The thoracic aorta tapers to a normal caliber at the level of the aortic arch. The descending thoracic aorta is of normal caliber.  Conventional configuration of the aortic arch. The branch vessels of the aortic arch appear widely patent throughout their imaged courses.  Normal heart size. Trace amount of pericardial fluid, presumably physiologic.  Although this examination was not tailored for the evaluation the pulmonary arteries, there are no discrete filling defects within the central pulmonary arterial tree to suggest central pulmonary embolism. Normal caliber of the main pulmonary artery.  -------------------------------------------------------------  Thoracic aortic measurements:  SINOTUBULAR JUNCTION: 32 mm as measured in greatest oblique short axis coronal dimension.  PROXIMAL ASCENDING THORACIC AORTA: 42 mm as measured in greatest oblique short axis axial dimension at the level of the main pulmonary artery (image 34, series 3) and approximately 42 mm in greatest short axis sagittal diameter (image 26, series 8), unchanged compared to the 2010 examination as well as remote chest CT performed 08/2004  AORTIC ARCH: 27 mm as measured in greatest oblique short axis sagittal dimension.  PROXIMAL DESCENDING THORACIC AORTA: 23 mm as measured in greatest oblique short axis axial dimension at the level of the main pulmonary artery.  DISTAL  DESCENDING THORACIC AORTA: 23 mm as measured in greatest oblique short axis axial dimension at the level of the diaphragmatic hiatus.  Review of the MIP images confirms the above findings.  -------------------------------------------------------------  Non-Vascular Findings:  Mediastinum/Lymph Nodes: No bulky mediastinal, hilar or axillary lymphadenopathy.  Lungs/Pleura: Minimal  dependent subpleural ground-glass atelectasis, left greater than right. No discrete focal airspace opacities.  Upper abdomen: Limited evaluation of the upper abdomen is normal.  Musculoskeletal: No acute or aggressive osseous abnormalities. T2 intense lesions within the approximate T9 and T10 vertebral bodies (sagittal images 18 and 19, series 8), are unchanged compared to the 2010 examination and thus of benign etiology, potentially intraosseous hemangiomas. Post right-sided mastectomy.  IMPRESSION: Stable uncomplicated mild fusiform aneurysmal dilatation of the ascending thoracic aorta measuring 42 mm in diameter, grossly unchanged compared to the 2010 examination as well as remote chest CT performed 08/2004.   Electronically Signed   By: Sandi Mariscal M.D.   On: 02/04/2021 08:53   Impression:  She has a stable 4.2 cm fusiform ascending aortic aneurysm that is not changed since it was diagnosed in 2005.  I reviewed the MRA images with her and answered her questions.  I stressed the importance of continued good blood pressure control in preventing further enlargement and acute aortic dissection.  Plan:  She will return to see me in 2 years with an MRA of the chest to follow-up on her aortic aneurysm.  I spent 15 minutes performing this established patient evaluation and > 50% of this time was spent face to face counseling and coordinating the care of this patient's aortic aneurysm.    Gaye Pollack, MD [facility] [phone]

## 2021-02-13 ENCOUNTER — Ambulatory Visit (INDEPENDENT_AMBULATORY_CARE_PROVIDER_SITE_OTHER): Payer: Medicare Other | Admitting: Cardiovascular Disease

## 2021-02-13 ENCOUNTER — Other Ambulatory Visit: Payer: Self-pay

## 2021-02-13 VITALS — BP 124/74 | HR 80 | Ht 65.0 in | Wt 141.0 lb

## 2021-02-13 DIAGNOSIS — I493 Ventricular premature depolarization: Secondary | ICD-10-CM | POA: Diagnosis not present

## 2021-02-13 DIAGNOSIS — R011 Cardiac murmur, unspecified: Secondary | ICD-10-CM

## 2021-02-13 DIAGNOSIS — I712 Thoracic aortic aneurysm, without rupture, unspecified: Secondary | ICD-10-CM

## 2021-02-13 NOTE — Patient Instructions (Addendum)

## 2021-03-27 ENCOUNTER — Ambulatory Visit: Payer: Medicare Other | Admitting: Podiatry

## 2021-04-09 DIAGNOSIS — R1314 Dysphagia, pharyngoesophageal phase: Secondary | ICD-10-CM | POA: Insufficient documentation

## 2021-04-09 DIAGNOSIS — J3489 Other specified disorders of nose and nasal sinuses: Secondary | ICD-10-CM | POA: Insufficient documentation

## 2021-04-15 ENCOUNTER — Other Ambulatory Visit (HOSPITAL_COMMUNITY): Payer: Self-pay | Admitting: Otolaryngology

## 2021-04-15 DIAGNOSIS — K219 Gastro-esophageal reflux disease without esophagitis: Secondary | ICD-10-CM

## 2021-04-15 DIAGNOSIS — R1314 Dysphagia, pharyngoesophageal phase: Secondary | ICD-10-CM

## 2021-04-18 ENCOUNTER — Ambulatory Visit (HOSPITAL_COMMUNITY)
Admission: RE | Admit: 2021-04-18 | Discharge: 2021-04-18 | Disposition: A | Discharge status: Home / Self Care | Payer: Medicare Other | Source: Ambulatory Visit | Attending: Otolaryngology | Admitting: Otolaryngology

## 2021-04-18 DIAGNOSIS — R1314 Dysphagia, pharyngoesophageal phase: Secondary | ICD-10-CM

## 2021-04-18 DIAGNOSIS — K219 Gastro-esophageal reflux disease without esophagitis: Secondary | ICD-10-CM | POA: Diagnosis present

## 2021-04-23 ENCOUNTER — Other Ambulatory Visit: Payer: Self-pay

## 2021-04-23 ENCOUNTER — Encounter: Payer: Self-pay | Admitting: Podiatry

## 2021-04-23 ENCOUNTER — Ambulatory Visit (INDEPENDENT_AMBULATORY_CARE_PROVIDER_SITE_OTHER): Payer: Medicare Other | Admitting: Podiatry

## 2021-04-23 DIAGNOSIS — Q828 Other specified congenital malformations of skin: Secondary | ICD-10-CM | POA: Diagnosis not present

## 2021-04-23 DIAGNOSIS — M79672 Pain in left foot: Secondary | ICD-10-CM | POA: Diagnosis not present

## 2021-04-23 DIAGNOSIS — M79671 Pain in right foot: Secondary | ICD-10-CM | POA: Diagnosis not present

## 2021-04-27 NOTE — Progress Notes (Signed)
  Subjective:  Patient ID: Cindy Schwartz, female    DOB: January 08, 1934,  MRN: [id number]  Cindy Schwartz presents to clinic today for painful porokeratotic lesions on the plantar aspect of both feet.  Pain prevents comfortable ambulation. Aggravating factor is weightbearing with or without shoegear.  PCP is Marin Olp, MD , and last visit was 12/20/2020.  Allergies  Allergen Reactions   Chlorhexidine Gluconate Rash    Review of Systems: Negative except as noted in the HPI. Objective:   Constitutional Cindy Schwartz is a pleasant 87 y.o. Caucasian female, WD, WN in NAD. AAO x 3.   Vascular Capillary fill time to digits <3 seconds b/l lower extremities. Palpable pedal pulses b/l LE. Pedal hair sparse. Lower extremity skin temperature gradient within normal limits. No edema noted b/l lower extremities. Varicosities present b/l. No cyanosis or clubbing noted.  Neurologic Normal speech. Oriented to person, place, and time. Protective sensation intact 5/5 intact bilaterally with 10g monofilament b/l. Vibratory sensation intact b/l.  Dermatologic Pedal skin is thin shiny, atrophic b/l lower extremities. No open wounds b/l lower extremities. No interdigital macerations b/l lower extremities. Porokeratotic lesion(s) submet head 2 left foot and submet head 2 right foot. No erythema, no edema, no drainage, no fluctuance.  Orthopedic: Normal muscle strength 5/5 to all lower extremity muscle groups bilaterally. Plantarflexed metatarsal(s) 2nd metatarsals b/l.   Radiographs: None Assessment:  No diagnosis found. Plan:  -Examined patient. -Medicare ABN signed for this year. Patient consents for services of paring of porokeratoses  today. Copy has been placed in patient's chart. -Patient to continue soft, supportive shoe gear daily. -Painful porokeratotic lesion(s) submet head 2 left foot and submet head 2 right foot pared and enucleated with sterile scalpel blade without incident. Total number of lesions  debrided=2. -Patient to report any pedal injuries to medical professional immediately. -Patient/POA to call should there be question/concern in the interim.  Return in about 3 months (around 07/24/2021).  Marzetta Board, DPM

## 2021-05-28 ENCOUNTER — Telehealth (INDEPENDENT_AMBULATORY_CARE_PROVIDER_SITE_OTHER): Payer: Medicare Other | Admitting: Family Medicine

## 2021-05-28 ENCOUNTER — Encounter: Payer: Self-pay | Admitting: Family Medicine

## 2021-05-28 VITALS — Temp 100.7°F | Ht 65.0 in | Wt 125.0 lb

## 2021-05-28 DIAGNOSIS — U071 COVID-19: Secondary | ICD-10-CM | POA: Diagnosis not present

## 2021-05-28 MED ORDER — MOLNUPIRAVIR 200 MG PO CAPS: 800.0000 mg | ORAL_CAPSULE | Freq: Two times a day (BID) | ORAL | 0 refills | Status: AC

## 2021-05-28 NOTE — Progress Notes (Signed)
   Cindy Schwartz is a 87 y.o. female who presents today for a telephone visit.  Assessment/Plan:  New/Acute Problems: Covid 19 No red flag signs or symptoms. Normal respiratory status. Due to age she is at increased risk of complication from covid. We discussed treatment options. Will start molnupirivir. She can continue OTC meds as needed. Discussed reasons to return to care and seek emergent care.    Subjective:  HPI:  Symptoms started 2 days ago.  Symptoms include body aches, headache, cough, and rhinorrhea. No known sick contacts. No shortness of breath or chest pain. Home covid test was positive this morning. Has tried OTC meds. Symptoms are stable.        Objective/Observations   NAD  Telephone Visit   I connected with Cindy Schwartz on 05/28/21 at 11:20 AM EDT via telephone and verified that I am speaking with the correct person using two identifiers. I discussed the limitations of evaluation and management by telemedicine and the availability of in person appointments. The patient expressed understanding and agreed to proceed.   Patient location: Home Provider location: [place] participating in the virtual visit: Myself and Patient  A total of 11 minutes were spent on medical discussion.      Algis Greenhouse. Jerline Pain, MD 05/28/2021 11:42 AM

## 2021-06-04 ENCOUNTER — Encounter: Payer: Self-pay | Admitting: Family Medicine

## 2021-06-04 ENCOUNTER — Telehealth (INDEPENDENT_AMBULATORY_CARE_PROVIDER_SITE_OTHER): Payer: Medicare Other | Admitting: Family Medicine

## 2021-06-04 ENCOUNTER — Other Ambulatory Visit: Payer: Self-pay

## 2021-06-04 VITALS — HR 83 | Temp 99.2°F

## 2021-06-04 DIAGNOSIS — U071 COVID-19: Secondary | ICD-10-CM

## 2021-06-04 NOTE — Patient Instructions (Addendum)
  HOME CARE TIPS:  -[place] COVID19 testing information: [email or web address]   -can use tylenol f needed for fevers, aches and pains per instructions  -can use nasal saline a few times per day if you have nasal congestion  -stay hydrated, drink plenty of fluids and eat small healthy meals - avoid dairy  -can take 1000 IU (30mg) Vit D3 and 100-500 mg of Vit C daily per instructions  -If the Covid test is positive, [facility] out [facility] for more information on home care, transmission and treatment for COVID19  -follow up with your doctor in 2-3 days unless improving and feeling better  -stay home while sick, except to seek medical care. If you have COVID19, ideally it would be best to stay home for a full 10 days since the onset of symptoms PLUS one day of no fever and feeling better. Wear a good mask that fits snugly (such as N95 or KN95) if around others to reduce the risk of transmission.  It was nice to meet you today, and I really hope you are feeling better soon. I help  out with telemedicine visits on Tuesdays and Thursdays and am available for visits on those days. If you have any concerns or questions following this visit please schedule a follow up visit with your Primary Care doctor or seek care at a local urgent care clinic to avoid delays in care.    Seek in person care or schedule a follow up video visit promptly if your symptoms worsen, new concerns arise or you are not improving with treatment. Call 911 and/or seek emergency care if your symptoms are severe or life threatening.

## 2021-06-04 NOTE — Progress Notes (Signed)
Virtual Visit via Telephone Note  I connected with Cindy Schwartz on 06/04/21 at 12:40 PM EDT by telephone and verified that I am speaking with the correct person using two identifiers.   I discussed the limitations, risks, security and privacy concerns of performing an evaluation and management service by telephone and the availability of in person appointments. I also discussed with the patient that there may be a patient responsible charge related to this service. The patient expressed understanding and agreed to proceed.  Location patient: home, [place] Location provider: work or home office Participants present for the call: patient, provider Patient did not have a visit with me in the prior 7 days to address this/these issue(s).   History of Present Illness:  Acute telemedicine visit for Covid19: -Onset: about 8-10 days; tested positive for Covid - was treated with molnupiravir finished about 3 days ago -Symptoms include: body aches, cough -was feeling better, but yesterday started feeling tired again, legs both feel achy, felt sweaty last night, mild cough, congestion -Denies: CP, SOB, fever, inability to eat/drink/get out of bed -Pertinent past medical history: see below -Pertinent medication allergies:  Allergies  Allergen Reactions   Chlorhexidine Gluconate Rash  -COVID-19 vaccine status: has had 2 doses  Past Medical History:  Diagnosis Date   Aneurysm of ascending aorta ([place])    a. 4.2-4.3cm in 2014.   Anxiety    Arthritis    Breast cancer ([place]) 2018   right mastectomy   Cancer ([place]) 1980   ovarian cancer   Compression fracture of L1 lumbar vertebra ([place])    COMPRESSION FRACTURE, THORACIC VERTEBRA 07/24/2010   Qualifier: Diagnosis of  By: [name], Balinda Quails    Elevated BP Transient 09/2014   Genetic testing 07/09/2017   Multi-Cancer panel (83 genes) @ [facility] - No pathogenic mutations detected   GERD (gastroesophageal reflux disease)    H/O bone density study    2015    H/O colonoscopy    History of ovarian cancer    November 1979   History of ovarian cancer 1979   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 I do not appreciate any SOB. Speech and thought processing are grossly intact. Patient reported vitals:  Assessment and Plan:  COVID-19  -we discussed possible serious and likely etiologies, options for evaluation and workup, limitations of telemedicine visit vs in person visit, treatment, treatment risks and precautions. Pt prefers to treat via telemedicine empirically rather than in person at this moment. Query rebound covid, ongoing intermittent symptoms from covid vs other. Opted to treat with nasal saline, tylenol if needed, Tessalon (she has some leftover from resp illness earlier this year) and advised low thresh hold for inperson eval if worsening, new symptoms arise, or if is not improving with treatment. Advised of options for inperson care in case PCP office not available. Did let the patient know that I only do telemedicine shifts for  on Tuesdays and Thursdays and advised a follow up visit with PCP or at an [facility] if has further questions or concerns.   Follow Up Instructions:  I did not refer this patient for an OV with me in the next 24 hours for this/these issue(s).  I discussed the assessment and treatment plan with the patient. The patient was  provided an opportunity to ask questions and all were answered. The patient agreed with the plan and demonstrated an understanding of the instructions.   I spent 15 minutes on the date of this visit in the care of this patient. See summary of tasks completed to properly care for this patient in the detailed notes above which also included counseling of above, review of PMH,  medications, allergies, evaluation of the patient and ordering and/or  instructing patient on testing and care options.     Lucretia Kern, DO

## 2021-06-21 ENCOUNTER — Encounter: Payer: Medicare Other | Admitting: Family Medicine

## 2021-06-21 NOTE — Progress Notes (Signed)
Phone [phone]   Subjective:  Patient presents today for their annual physical. Chief complaint-noted.   See problem oriented charting- ROS- full  review of systems was completed and negative except for: congestion, post nasal drip post covid, cold intolerance, light headedness. No sinus pressure  The following were reviewed and entered/updated in epic: Past Medical History:  Diagnosis Date   Aneurysm of ascending aorta    a. 4.2-4.3cm in 2014.   Anxiety    Arthritis    Breast cancer ([place]) 2018   right mastectomy   Cancer ([place]) 1980   ovarian cancer   Compression fracture of L1 lumbar vertebra ([place])    COMPRESSION FRACTURE, THORACIC VERTEBRA 07/24/2010   Qualifier: Diagnosis of  By: [name], Balinda Quails    Elevated BP Transient 09/2014   Genetic testing 07/09/2017   Multi-Cancer panel (83 genes) @ [facility] - No pathogenic mutations detected   GERD (gastroesophageal reflux disease)    H/O bone density study    2015   H/O colonoscopy    History of ovarian cancer    November 1979   History of ovarian cancer 1979   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   Insomnia    Irregular heart beat    Normal coronary arteries Cath - 09/2014   Osteoporosis    Raynaud's disease    Patient Active Problem List   Diagnosis Date Noted   History of cerebellar stroke 10/05/2020    Priority: 1.   Memory loss 10/05/2020    Priority: 1.   history of Breast cancer of lower-outer quadrant of right female breast  01/16/2016    Priority: 1.   Lower abdominal pain 06/28/2010    Priority: 1.   Aneurysm of thoracic aorta 05/06/2007    Priority: 1.   History of ovarian cancer 09/22/1977    Priority: 1.   History of adenomatous polyp of colon 12/30/2019    Priority: 2.   Mild hyperlipidemia 06/01/2019    Priority: 2.   Chronic cough 01/21/2018    Priority: 2.   Normal coronary arteries 10/17/2014    Priority: 2.   Hx of herpes simplex infection 12/18/2011     Priority: 2.   Esophageal reflux 05/06/2007    Priority: 2.   Osteoporosis 03/18/2007    Priority: 2.   Degenerative arthritis of knee, bilateral 04/08/2019    Priority: 3.   Chronic idiopathic constipation 03/01/2019    Priority: 3.   Genetic testing 07/09/2017    Priority: 3.   Palpitations 11/13/2014    Priority: 3.   Varicose veins of lower extremities with other complications 28/41/3244    Priority: 3.   Low back pain 06/28/2010    Priority: 3.   Generalized anxiety disorder 10/13/2008    Priority: 3.   Irritable bowel syndrome 10/13/2008    Priority: 3.   Lung nodule 11/25/2007    Priority: 3.   Raynaud's syndrome 07/01/2007    Priority: 3.   Allergic rhinitis 05/06/2007    Priority: 3.   DEGENERATIVE DISC DISEASE, CERVICAL SPINE 05/06/2007    Priority: 3.   Pharyngoesophageal dysphagia 04/09/2021   Sinus pressure 04/09/2021   Pain due to onychomycosis of toenails of both feet 12/25/2020   Arthritis of both knees 09/10/2020   Osteoarthritis of both hands 08/29/2020   Bilateral hand pain 08/29/2020   Bilateral carpal tunnel syndrome 08/29/2020   Trigger thumb of left hand 08/29/2020   Past Surgical History:  Procedure Laterality Date   ABDOMINAL  HYSTERECTOMY  1979   APPENDECTOMY     BILATERAL SALPINGOOPHORECTOMY     BREAST LUMPECTOMY     COLONOSCOPY  02/2012   LEFT HEART CATHETERIZATION WITH CORONARY ANGIOGRAM N/A 10/17/2014   Procedure: LEFT HEART CATHETERIZATION WITH CORONARY ANGIOGRAM;  Surgeon: [name], MD;  Location: [facility] CATH LAB;  Service: Cardiovascular;  Laterality: N/A;   ovarian ca s/p taj/bso  1979   SIMPLE MASTECTOMY WITH AXILLARY SENTINEL NODE BIOPSY Right 05/05/2017   Procedure: RIGHT TOTAL MASTECTOMY WITH RIGHT  AXILLARY SENTINEL NODE BIOPSY;  Surgeon: [name], MD;  Location: [facility];  Service: General;  Laterality: Right;  PECTORAL BLOCK    Family History  Problem Relation Age of Onset   Hip fracture Mother    Other Father         lived to 41   Healthy Brother    Colon polyps Brother    Breast cancer Cousin 93       mat first cousin related through uncle   Esophageal cancer Cousin 42       mat first cousin related through uncle    Medications- reviewed and updated Current Outpatient Medications  Medication Sig Dispense Refill   Ascorbic Acid (VITAMIN C) 1000 MG tablet Take 1,000 mg by mouth daily.     aspirin EC 81 MG tablet Take 81 mg by mouth daily. Swallow whole.     Cholecalciferol (VITAMIN D3) 1000 units CAPS Take 2,000 Units by mouth daily.     esomeprazole (NEXIUM) 20 MG capsule Take 20 mg by mouth daily at 12 noon.     fluticasone (FLONASE) 50 MCG/ACT nasal spray Place 2 sprays into both nostrils daily. 16 g 0   MAGNESIUM PO Take 300 mg by mouth.     valACYclovir (VALTREX) 500 MG tablet Take 2 tablets (1,000 mg total) by mouth as needed. 90 tablet 3   rosuvastatin (CRESTOR) 5 MG tablet Take 1 tablet (5 mg total) by mouth daily. Take 1 tablet daily by mouth. 90 tablet 3   No current facility-administered medications for this visit.    Allergies-reviewed and updated Allergies  Allergen Reactions   Chlorhexidine Gluconate Rash    Social History   Social History Narrative   Married. She and her husband are from the [place] area of [place]. Children: Treasa Bradshaw, lives in [place], [place], lives in [place], [place]. 4 grandkids. No greatgrandkids.       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    Objective  Objective:  BP 126/73   Pulse 69   Temp 98.4 F (36.9 C) (Temporal)   Ht 5\' 5"  (1.651 m)   Wt 141 lb 6.4 oz (64.1 kg)   LMP  (LMP Unknown)   SpO2 96%   BMI 23.53 kg/m  Gen: NAD, resting comfortably HEENT: Mucous membranes are moist. Oropharynx normal Neck: no thyromegaly CV: RRR no murmurs rubs or gallops Lungs: CTAB no crackles, wheeze, rhonchi Abdomen: soft/nontender/nondistended/normal  bowel sounds. No rebound or guarding.  Ext: no edema Skin: warm, dry Neuro: grossly normal, moves all extremities, PERRLA   Assessment and Plan   87 y.o. female presenting for annual physical.  Health Maintenance counseling: 1. Anticipatory guidance: Patient counseled regarding regular dental exams -q6 months, eye exams- yearly or twice yearly,  avoiding smoking and second hand smoke , limiting alcohol to 1 beverage per day- does not drink.  Marland Kitchen  No illicit drugs-  2. Risk factor reduction:  Advised patient of need for regular exercise and diet rich and fruits and vegetables to reduce risk of heart attack and stroke. Exercise- walking trying daily  except sat/sunday.  Diet-reasonably healthy diet Wt Readings from Last 3 Encounters:  06/26/21 141 lb 6.4 oz (64.1 kg)  05/28/21 125 lb (56.7 kg)  02/13/21 141 lb (64 kg)  3. Immunizations/screenings/ancillary studies DISCUSSED:  -Shingrix vaccination #1 - consider at pharmacy - consider bivalent covid vaccine at pharmacy -Flu vaccination (last on 09/21) - high dose flu shot today Prevnar 20 - consider next year Immunization History  Administered Date(s) Administered   Fluad Quad(high Dose 65+) 06/01/2019, 06/11/2020   Influenza Split 06/26/2011, 07/07/2012   Influenza Whole 07/09/1999, 07/01/2007, 06/27/2008, 07/11/2009, 06/28/2010   Influenza, High Dose Seasonal PF 07/13/2013, 06/26/2015, 07/15/2017, 06/21/2018   Influenza,inj,Quad PF,6+ Mos 06/22/2014   Influenza,inj,quad, With Preservative 06/01/2019   Influenza-Unspecified 08/15/2016   PFIZER(Purple Top)SARS-COV-2 Vaccination 10/14/2019, 11/05/2019   Pneumococcal Polysaccharide-23 12/20/2012, 11/04/2018   Td 09/23/2003   Tdap 06/04/2020   Zoster, Live 01/19/2013  4. Cervical cancer screening- pap smear 01/09/09 -  past age based screening recommendations.  No vaginal bleeding or dischrage 5. Breast cancer screening-  breast exam- -history of breast cancer in 2018 on the right Also  follows with Dr. [name] for breast exam  yearly and mammogram 02/07/21-yearly on left breast.  6. Colon cancer screening -  denies history of abnormal colonoscopy.  Past age based screening  7. Skin cancer screening- follows with Dr. Delman Cheadle in the past. advised regular sunscreen use. Denies worrisome, changing, or new skin lesions.  8. Birth control/STD check- widowed- not a current concern  9. Osteoporosis screening at 59- DEXA 11/11/20see below -Never smoker  Status of chronic or acute concerns   # Hx of COVID - tested positive on 05/28/2021 and seen Dr.Parker on a video visit.  -lingering cough, post nasal drip. No sinus pressure. Doing some home cough medicine -With lingering issues we will trial a combination of Flonase in AM and Claritin or Allegra before bed  #History of cerebellar stroke with some balance and memory issues/hyperlipidemia S: Patient complained of issues with memory loss-on MRI of brain 09/17/2020-"Remote right cerebellar infarct."  This was not noted on prior MRI in 2014.  We started aspirin 81 mg (misses some doses) as well as rosuvastatin 5 mg daily with LDL goal less than 70    Memory and balance issues could be associated with prior stroke.  Some of memory concerns could be related to stress after loss of husband in August 2021. Memory and balance issues are not woe than in the past- states not at point of needing cane Lab Results  Component Value Date   CHOL 171 06/11/2020   HDL 65 06/11/2020   LDLCALC 93 06/11/2020   LDLDIRECT 56.0 12/11/2020   TRIG 50 06/11/2020   CHOLHDL 2.6 06/11/2020   A/P: hx stroke/HLD- continue rosuvastatin 5 mg and LDL goal under 70- continue asa at least twice a week  For ongoing gait/balance issues after prior cerebellar stroke-we will place a referral to physical therapy eyedropper age for her to see if we can improve this and also gave her some home exercises to work on  # Aneurysm of thoracic aorta S: Gets MRI/MRI with Dr.  [name] every other year-last done May 2022 and was stable at 42 mm.  Blood pressure goal less than 130/80 -Patient with normal coronary arteries in the past but  followed with Dr. Johnsie Cancel every 6 months- palpitations with PVCs/PACs in the past A/P: Stable-continue to monitor-continue to control blood pressure   #Esophageal reflux/history of chronic cough- seen Dr. Chase Caller in the past S: Compliant with Nexium 20 mg daily.  Has failed H2 blockers in the past - also saw Dr. Constance Holster for this earlier this year- appeared to be reflux and had severe esophageal dysmotility A/P: Has failed prior H2 blockers-continue Nexium.  We will check B12 level with long-term PPI use.   #Osteoporosis S: Patient on calcium and vitamin D.  Took Fosamax for years.  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  DEXA October 2018 showed worst T score at -3.0 lumbar spine. On different machine in 07/2019 worst t score - 2.2 RFN and LFN -2020 declined Prolia -does take calcium and vitamin D and have encouraged weight bearing exercise  -Nexium not ideal for osteoporosis but have not been able to get her off of this  A/P: Offered repeat bone density- due at this time- she will schedule - no upcoming dental work planned and if worsened could consider - continue walking/stretching  # flare up of herpes simplex- needed refill on valtrex 500 mg as needed.  Back in March. Used daily at the time-no further issues. Has been doing once a week and has been keeping things at bay   Recommended follow up: No follow-ups on file. Future Appointments  Date Time Provider [place]  07/24/2021  9:30 AM Marzetta Board, DPM [facility]  09/19/2021  8:45 AM [facility] HEALTH COACH [facility]   Lab/Order associations: fasting   ICD-10-CM   1. Preventative health care  Z00.00     2. Other osteoporosis, unspecified pathological fracture presence  M81.8 VITAMIN D 25 Hydroxy (Vit-D  Deficiency, Fractures)    3. Gastroesophageal reflux disease without esophagitis  K21.9     4. Thoracic aortic aneurysm without rupture  I71.20     5. Mild hyperlipidemia  E78.5 TSH    CBC with Differential/Platelet    Comprehensive metabolic panel    Lipid panel    6. History of cerebellar stroke  Z86.73 Ambulatory referral to Physical Therapy    7. Memory loss  R41.3     8. Sinus pressure  J34.89     9. High risk medication use  Z79.899 Vitamin B12    10. Balance disorder  R26.89 Ambulatory referral to Physical Therapy      Meds ordered this encounter  Medications   fluticasone (FLONASE) 50 MCG/ACT nasal spray    Sig: Place 2 sprays into both nostrils daily.    Dispense:  16 g    Refill:  0   DISCONTD: rosuvastatin (CRESTOR) 5 MG tablet    Sig: Take 1 tablet (5 mg total) by mouth daily. Take 1 tablet daily by mouth.    Dispense:  90 tablet    Refill:  3   rosuvastatin (CRESTOR) 5 MG tablet    Sig: Take 1 tablet (5 mg total) by mouth daily. Take 1 tablet daily by mouth.    Dispense:  90 tablet    Refill:  3     I,Jada Bradford,acting as a scribe for Garret Reddish, MD.,have documented all relevant documentation on the behalf of Garret Reddish, MD,as directed by  Garret Reddish, MD while in the presence of Garret Reddish, MD.   I, Garret Reddish, MD, have reviewed all documentation for this visit. The documentation on 06/26/21 for the exam, diagnosis,  procedures, and orders are all accurate and complete.   Return precautions advised.  Garret Reddish, MD

## 2021-06-26 ENCOUNTER — Ambulatory Visit (INDEPENDENT_AMBULATORY_CARE_PROVIDER_SITE_OTHER): Payer: Medicare Other | Admitting: Family Medicine

## 2021-06-26 ENCOUNTER — Other Ambulatory Visit: Payer: Self-pay | Admitting: Family Medicine

## 2021-06-26 ENCOUNTER — Other Ambulatory Visit: Payer: Self-pay

## 2021-06-26 ENCOUNTER — Encounter: Payer: Self-pay | Admitting: Family Medicine

## 2021-06-26 ENCOUNTER — Telehealth: Payer: Self-pay

## 2021-06-26 VITALS — BP 126/73 | HR 69 | Temp 98.4°F | Ht 65.0 in | Wt 141.4 lb

## 2021-06-26 DIAGNOSIS — M818 Other osteoporosis without current pathological fracture: Secondary | ICD-10-CM

## 2021-06-26 DIAGNOSIS — Z79899 Other long term (current) drug therapy: Secondary | ICD-10-CM | POA: Diagnosis not present

## 2021-06-26 DIAGNOSIS — Z0000 Encounter for general adult medical examination without abnormal findings: Secondary | ICD-10-CM | POA: Diagnosis not present

## 2021-06-26 DIAGNOSIS — E785 Hyperlipidemia, unspecified: Secondary | ICD-10-CM

## 2021-06-26 DIAGNOSIS — Z8673 Personal history of transient ischemic attack (TIA), and cerebral infarction without residual deficits: Secondary | ICD-10-CM

## 2021-06-26 DIAGNOSIS — R2689 Other abnormalities of gait and mobility: Secondary | ICD-10-CM

## 2021-06-26 DIAGNOSIS — I712 Thoracic aortic aneurysm, without rupture, unspecified: Secondary | ICD-10-CM | POA: Diagnosis not present

## 2021-06-26 DIAGNOSIS — R413 Other amnesia: Secondary | ICD-10-CM

## 2021-06-26 DIAGNOSIS — Z23 Encounter for immunization: Secondary | ICD-10-CM | POA: Diagnosis not present

## 2021-06-26 DIAGNOSIS — K219 Gastro-esophageal reflux disease without esophagitis: Secondary | ICD-10-CM

## 2021-06-26 DIAGNOSIS — J3489 Other specified disorders of nose and nasal sinuses: Secondary | ICD-10-CM

## 2021-06-26 LAB — COMPREHENSIVE METABOLIC PANEL
ALT: 15 U/L (ref 0–35)
AST: 20 U/L (ref 0–37)
Albumin: 4.3 g/dL (ref 3.5–5.2)
Alkaline Phosphatase: 82 U/L (ref 39–117)
BUN: 12 mg/dL (ref 6–23)
CO2: 30 mEq/L (ref 19–32)
Calcium: 9.5 mg/dL (ref 8.4–10.5)
Chloride: 98 mEq/L (ref 96–112)
Creatinine, Ser: 0.58 mg/dL (ref 0.40–1.20)
GFR: 82.46 mL/min (ref >60.00)
Glucose, Bld: 78 mg/dL (ref 70–99)
Potassium: 4.2 mEq/L (ref 3.5–5.1)
Sodium: 134 mEq/L — ABNORMAL LOW (ref 135–145)
Total Bilirubin: 0.8 mg/dL (ref 0.2–1.2)
Total Protein: 7 g/dL (ref 6.0–8.3)

## 2021-06-26 LAB — CBC WITH DIFFERENTIAL/PLATELET
Basophils Absolute: 0 10*3/uL (ref 0.0–0.1)
Basophils Relative: 1 % (ref 0.0–3.0)
Eosinophils Absolute: 0.3 10*3/uL (ref 0.0–0.7)
Eosinophils Relative: 6.1 % — ABNORMAL HIGH (ref 0.0–5.0)
HCT: 38.8 % (ref 36.0–46.0)
Hemoglobin: 12.7 g/dL (ref 12.0–15.0)
Lymphocytes Relative: 35.8 % (ref 12.0–46.0)
Lymphs Abs: 1.5 10*3/uL (ref 0.7–4.0)
MCHC: 32.8 g/dL (ref 30.0–36.0)
MCV: 84.2 fl (ref 78.0–100.0)
Monocytes Absolute: 0.6 10*3/uL (ref 0.1–1.0)
Monocytes Relative: 13.4 % — ABNORMAL HIGH (ref 3.0–12.0)
Neutro Abs: 1.8 10*3/uL (ref 1.4–7.7)
Neutrophils Relative %: 43.7 % (ref 43.0–77.0)
Platelets: 216 10*3/uL (ref 150.0–400.0)
RBC: 4.6 Mil/uL (ref 3.87–5.11)
RDW: 14.6 % (ref 11.5–15.5)
WBC: 4.2 10*3/uL (ref 4.0–10.5)

## 2021-06-26 LAB — LIPID PANEL
Cholesterol: 146 mg/dL (ref 0–200)
HDL: 67.6 mg/dL (ref >39.00)
LDL Cholesterol: 69 mg/dL (ref 0–99)
NonHDL: 78.42
Total CHOL/HDL Ratio: 2
Triglycerides: 48 mg/dL (ref 0.0–149.0)
VLDL: 9.6 mg/dL (ref 0.0–40.0)

## 2021-06-26 LAB — TSH: TSH: 1.56 u[IU]/mL (ref 0.35–5.50)

## 2021-06-26 LAB — VITAMIN B12: Vitamin B-12: 472 pg/mL (ref 211–911)

## 2021-06-26 LAB — VITAMIN D 25 HYDROXY (VIT D DEFICIENCY, FRACTURES): VITD: 59.86 ng/mL (ref 30.00–100.00)

## 2021-06-26 MED ORDER — ROSUVASTATIN CALCIUM 5 MG PO TABS: 5.0000 mg | ORAL_TABLET | Freq: Every day | ORAL | 3 refills | Status: DC

## 2021-06-26 MED ORDER — FLUTICASONE PROPIONATE 50 MCG/ACT NA SUSP: 2.0000 | Freq: Every day | NASAL | 0 refills | Status: DC

## 2021-06-26 NOTE — Patient Instructions (Addendum)
Health Maintenance Due  Topic Date Due   Zoster Vaccines- Shingrix (1 of 2)   - Please check with your pharmacy to see if they have the shingrix vaccine. If they do- please get this immunization and update [place] by phone call or mychart with dates you receive the vaccine  Never done   INFLUENZA VACCINE - High dose done today in office.  04/22/2021   Decline COVID shot at this time but please consider new Zelphia [place] /bivalent booster shot this season at your local pharmacy.  Try Flonase, 2 puffs in each nostrils, for the next few weeks and update me on how it worked. Addition, try Claritin/Allegra before bed.   We will call you within two weeks about your referral to physical therapy at [facility]. If you do not hear within 2 weeks, give [place] a call. Check and see if you can schedule at desk- I think they have to call though  Please stop by lab before you go If you have mychart- we will send your results within 3 business days of [place] receiving them.  If you do not have mychart- we will call you about results within 5 business days of [place] receiving them.  *please also note that you will see labs on mychart as soon as they post. I will later go in and write notes on them- will say "notes from Dr. Yong Channel"  Schedule your bone density test at check out desk before you leave. Schedule an appointment that is convenient for you November 11th or later - located [address] across the street from [place] - in the basement - you do need an appointment for the bone density tests.  Recommended follow up: Return in about 6 months (around 12/25/2021) for follow-up or sooner if needed.Marland Kitchen

## 2021-06-26 NOTE — Telephone Encounter (Signed)
Called patient to get scheduled for an appointment for PT, requesting that the referral go to [place].

## 2021-06-26 NOTE — Telephone Encounter (Signed)
Referral has been switched to [place] for scheduling.

## 2021-06-28 ENCOUNTER — Telehealth: Payer: Self-pay

## 2021-06-28 NOTE — Telephone Encounter (Signed)
Returned pt call and labs reviewed.  

## 2021-06-28 NOTE — Telephone Encounter (Signed)
Pt called regarding labs. Please Advise.

## 2021-07-01 ENCOUNTER — Telehealth: Payer: Self-pay

## 2021-07-01 NOTE — Telephone Encounter (Signed)
Pt called regarding her referral to physical therapy. She stated that since starting therapy she is having pain in her lower back and leg. She wants Dr Yong Channel to be aware and see what can be done. Please Advise.

## 2021-07-02 NOTE — Telephone Encounter (Signed)
She can ask her physical therapist to think this is typical for the exercises she is doing-if not can offer referral to sports medicine under symptoms listed

## 2021-07-04 NOTE — Telephone Encounter (Signed)
Called and lm on pt vm tcb. 

## 2021-07-04 NOTE — Telephone Encounter (Signed)
Patient is returning a call from [place].

## 2021-07-05 NOTE — Telephone Encounter (Signed)
Called and lm on pt vm, since we have been playing phone tag when pt calls back please read Dr. Yong Channel message below.

## 2021-07-05 NOTE — Telephone Encounter (Signed)
Noted  

## 2021-07-05 NOTE — Telephone Encounter (Signed)
Patient returned phone call to [facility]. Gave message below, patient states she hasnt seen anyone for physical therapy yet, looked at the referral and gave her the number to [facility] to get scheduled. She is going to check with them to see when her appointment is and if it is too far out then will call us for the referral to sports med.

## 2021-07-10 NOTE — Telephone Encounter (Signed)
Pt called stating that she tried calling [facility] but they do not have the referral for her. Cindy Schwartz is confused and would like a call back with where Dr Yong Channel is referring her to. Please Advise.

## 2021-07-12 NOTE — Telephone Encounter (Signed)
Called patient and gave her the info for PT at [facility]

## 2021-07-24 ENCOUNTER — Ambulatory Visit (INDEPENDENT_AMBULATORY_CARE_PROVIDER_SITE_OTHER): Payer: Medicare Other | Admitting: Podiatry

## 2021-07-24 ENCOUNTER — Encounter: Payer: Self-pay | Admitting: Podiatry

## 2021-07-24 ENCOUNTER — Other Ambulatory Visit: Payer: Self-pay

## 2021-07-24 DIAGNOSIS — M79672 Pain in left foot: Secondary | ICD-10-CM

## 2021-07-24 DIAGNOSIS — M79671 Pain in right foot: Secondary | ICD-10-CM | POA: Diagnosis not present

## 2021-07-24 DIAGNOSIS — M79674 Pain in right toe(s): Secondary | ICD-10-CM | POA: Diagnosis not present

## 2021-07-24 DIAGNOSIS — B351 Tinea unguium: Secondary | ICD-10-CM

## 2021-07-24 DIAGNOSIS — M79675 Pain in left toe(s): Secondary | ICD-10-CM | POA: Diagnosis not present

## 2021-07-24 DIAGNOSIS — Q828 Other specified congenital malformations of skin: Secondary | ICD-10-CM

## 2021-07-27 NOTE — Progress Notes (Signed)
Subjective: Cindy Schwartz is a 87 y.o. female patient seen today for follow up of painful porokeratotic lesions and painful thick toenails that are difficult to trim. Pain interferes with ambulation. Aggravating factors include wearing enclosed shoe gear. Pain is relieved with periodic professional debridement.  Patient states great toes are tender. Denies any redness, drainage or swelling  Allergies  Allergen Reactions   Chlorhexidine Gluconate Rash    Objective: Physical Exam  General: Patient is a pleasant 87 y.o. Caucasian female WD, WN in NAD. AAO x 3.   Neurovascular Examination: CFT <3 seconds b/l LE. Palpable DP pulse(s) b/l lower extremities Palpable PT pulse(s) b/l lower extremities Pedal hair sparse. No pain with calf compression b/l. Lower extremity skin temperature gradient within normal limits. Varicosities present b/l.  Protective sensation intact 5/5 intact bilaterally with 10g monofilament b/l. Vibratory sensation intact b/l.  Dermatological:  Pedal skin thin, shiny and atrophic b/l LE. No open wounds b/l LE. No interdigital macerations noted b/l LE. Toenails medial border left hallux and medial border right hallux elongated, discolored, dystrophic, thickened, and crumbly with subungual debris and tenderness to dorsal palpation. Porokeratotic lesion(s) submet head 2 b/l. No erythema, no edema, no drainage, no fluctuance.  Musculoskeletal:  Normal muscle strength 5/5 to all lower extremity muscle groups bilaterally. Plantarflexed metatarsal(s) 2nd metatarsal head b/l feet.  Assessment: 1. Pain due to onychomycosis of toenails of both feet   2. Porokeratosis   3. Pain in both feet    Plan: Patient was evaluated and treated and all questions answered. Consent given for treatment as described below: -Medicare ABN on file for paring of lesions of both feet. -Offending nail border debrided and curretaged L hallux and R hallux utilizing sterile nail nipper and currette.  Border cleansed with alcohol and triple antibiotic applied. No further treatment required by patient/caregiver. -Painful porokeratotic lesion(s) submet head 2 b/l pared and enucleated with sterile scalpel blade without incident. Total number of lesions debrided=2. -Patient/POA to call should there be question/concern in the interim.  Return in about 3 months (around 10/24/2021).  Marzetta Board, DPM

## 2021-08-05 ENCOUNTER — Telehealth: Payer: Self-pay

## 2021-08-05 DIAGNOSIS — M818 Other osteoporosis without current pathological fracture: Secondary | ICD-10-CM

## 2021-08-05 NOTE — Telephone Encounter (Signed)
Bone density order has been placed.

## 2021-08-05 NOTE — Telephone Encounter (Signed)
Talked to pt and she is aware that the order has been placed. I offered to go ahead and schedule test. Pt stated that Cindy Schwartz told her that they will call her to get scheduled. Pt wanted to wait.

## 2021-08-05 NOTE — Telephone Encounter (Signed)
[employer] called needing an order placed for a bone density test. Can the order be placed? Please Advise.

## 2021-08-05 NOTE — Telephone Encounter (Signed)
Patient called in and stated she needed a order for her bone density.

## 2021-08-05 NOTE — Telephone Encounter (Signed)
Bone density has been placed.

## 2021-08-08 ENCOUNTER — Other Ambulatory Visit: Payer: Self-pay

## 2021-08-08 ENCOUNTER — Ambulatory Visit (INDEPENDENT_AMBULATORY_CARE_PROVIDER_SITE_OTHER)
Admission: RE | Admit: 2021-08-08 | Discharge: 2021-08-08 | Disposition: A | Discharge status: Home / Self Care | Payer: Medicare Other | Source: Ambulatory Visit | Attending: Family Medicine | Admitting: Family Medicine

## 2021-08-08 DIAGNOSIS — M818 Other osteoporosis without current pathological fracture: Secondary | ICD-10-CM

## 2021-08-13 ENCOUNTER — Telehealth: Payer: Self-pay

## 2021-08-13 NOTE — Telephone Encounter (Signed)
Patient is returning a call about her bone density results.

## 2021-08-13 NOTE — Telephone Encounter (Signed)
Spoke with patient.

## 2021-08-19 ENCOUNTER — Telehealth: Payer: Self-pay

## 2021-08-19 NOTE — Telephone Encounter (Signed)
Received  previous GI records on this patient called her to ask if she has a specific provider whom she would like to see left voicemail.

## 2021-09-19 ENCOUNTER — Ambulatory Visit: Payer: Medicare Other

## 2021-09-24 ENCOUNTER — Other Ambulatory Visit: Payer: Self-pay

## 2021-09-24 ENCOUNTER — Ambulatory Visit (INDEPENDENT_AMBULATORY_CARE_PROVIDER_SITE_OTHER): Payer: Medicare Other

## 2021-09-24 DIAGNOSIS — Z0000 Encounter for general adult medical examination without abnormal findings: Secondary | ICD-10-CM | POA: Diagnosis not present

## 2021-09-24 NOTE — Progress Notes (Addendum)
Virtual Visit via Telephone Note  I connected with  Cindy Schwartz on 09/24/21 at  8:00 AM EST by telephone and verified that I am speaking with the correct person using two identifiers.  Medicare Annual Wellness visit completed telephonically due to Covid-19 pandemic.   Persons participating in this call: This Health Coach and this patient.   Location: Patient: home Provider: office   I discussed the limitations, risks, security and privacy concerns of performing an evaluation and management service by telephone and the availability of in person appointments. The patient expressed understanding and agreed to proceed.  Unable to perform video visit due to video visit attempted and failed and/or patient does not have video capability.   Some vital signs may be absent or patient reported.   Willette Brace, LPN   Subjective:   Cindy Schwartz is a 88 y.o. female who presents for Medicare Annual (Subsequent) preventive examination.  Review of Systems     Cardiac Risk Factors include: advanced age (>58men, >27 women);dyslipidemia     Objective:    There were no vitals filed for this visit. There is no height or weight on file to calculate BMI.  Advanced Directives 09/24/2021 09/10/2020 08/25/2019 11/15/2018 06/25/2017 05/05/2017 04/27/2017  Does Patient Have a Medical Advance Directive? Yes Yes Yes No;Yes Yes Yes Yes  Type of Academic librarian Living will [place];Living will [place];Living will;Out of facility DNR (pink MOST or yellow form) [place];Living will [place];Living will -  Does patient want to make changes to medical advance directive? - - No - Patient declined - No - Patient declined No - Patient declined -  Copy of [place] in Chart? No - copy requested - No - copy requested No - copy requested No - copy requested No - copy requested -    Current  Medications (verified) Outpatient Encounter Medications as of 09/24/2021  Medication Sig   Ascorbic Acid (VITAMIN C) 1000 MG tablet Take 1,000 mg by mouth daily.   aspirin EC 81 MG tablet Take 81 mg by mouth as needed. Swallow whole. As needed   Cholecalciferol (VITAMIN D3) 1000 units CAPS Take 2,000 Units by mouth daily.   esomeprazole (NEXIUM) 20 MG capsule Take 20 mg by mouth daily at 12 noon.   fluticasone (FLONASE) 50 MCG/ACT nasal spray SHAKE LIQUID AND USE 2 SPRAYS IN EACH NOSTRIL DAILY   MAGNESIUM PO Take 300 mg by mouth.   rosuvastatin (CRESTOR) 5 MG tablet Take 1 tablet (5 mg total) by mouth daily. Take 1 tablet daily by mouth.   valACYclovir (VALTREX) 500 MG tablet Take 2 tablets (1,000 mg total) by mouth as needed.   hydrocortisone (ANUSOL-HC) 2.5 % rectal cream Apply topically 2 (two) times daily. (Patient not taking: Reported on 09/24/2021)   No facility-administered encounter medications on file as of 09/24/2021.    Allergies (verified) Chlorhexidine gluconate   History: Past Medical History:  Diagnosis Date   Aneurysm of ascending aorta    a. 4.2-4.3cm in 2014.   Anxiety    Arthritis    Breast cancer ([place]) 2018   right mastectomy   Cancer ([place]) 1980   ovarian cancer   Compression fracture of L1 lumbar vertebra ([place])    COMPRESSION FRACTURE, THORACIC VERTEBRA 07/24/2010   Qualifier: Diagnosis of  By: [name], [name]    Elevated BP Transient 09/2014   Genetic testing 07/09/2017   Multi-Cancer panel (  83 genes) @ [facility] - No pathogenic mutations detected   GERD (gastroesophageal reflux disease)    H/O bone density study    2015   H/O colonoscopy    History of ovarian cancer    November 1979   History of ovarian cancer 1979   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   Insomnia    Irregular heart beat    Normal coronary arteries Cath - 09/2014   Osteoporosis    Raynaud's disease    Past Surgical History:  Procedure Laterality Date    ABDOMINAL HYSTERECTOMY  1979   APPENDECTOMY     BILATERAL SALPINGOOPHORECTOMY     BREAST LUMPECTOMY     COLONOSCOPY  02/2012   LEFT HEART CATHETERIZATION WITH CORONARY ANGIOGRAM N/A 10/17/2014   Procedure: LEFT HEART CATHETERIZATION WITH CORONARY ANGIOGRAM;  Surgeon: [name], MD;  Location[facility] CATH LAB;  Service: Cardiovascular;  Laterality: N/A;   ovarian ca s/p taj/bso  1979   SIMPLE MASTECTOMY WITH AXILLARY SENTINEL NODE BIOPSY Right 05/05/2017   Procedure: RIGHT TOTAL MASTECTOMY WITH RIGHT  AXILLARY SENTINEL NODE BIOPSY;  Surgeon: [name], MD;  Location: [facility];  Service: General;  Laterality: Right;  PECTORAL BLOCK   Family History  Problem Relation Age of Onset   Hip fracture Mother    Other Father        lived to 43   Healthy Brother    Colon polyps Brother    Breast cancer Cousin 38       mat first cousin related through uncle   Esophageal cancer Cousin 32       mat first cousin related through uncle   Social History   Socioeconomic History   Marital status: Married    Spouse name: Publishing copy   Number of children: 2   Years of education: Not on file   Highest education level: Not on file  Occupational History   Occupation: retired    Fish farm manager: RETIRED  Tobacco Use   Smoking status: Never   Smokeless tobacco: Never  Vaping Use   Vaping Use: Never used  Substance and Sexual Activity   Alcohol use: No   Drug use: No   Sexual activity: Not on file  Other Topics Concern   Not on file  Social History Narrative   Widowed aug 2021. She and her husband  from the [place] of [place]. Children: Davin Muramoto, lives in [place], [place], lives in [place], [place]. 4 grandkids. No greatgrandkids.       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    Social Determinants of Health   Financial Resource Strain: Low Risk    Difficulty of Paying Living Expenses: Not hard at all   Food Insecurity: No Food Insecurity   Worried About Charity fundraiser in the Last Year: Never true   [place] in the Last Year: Never true  Transportation Needs: No Transportation Needs   Lack of Transportation (Medical): No   Lack of Transportation (Non-Medical): No  Physical Activity: Inactive   Days of Exercise per Week: 0 days   Minutes of Exercise per Session: 0 min  Stress: No Stress Concern Present   Feeling of Stress : Only a little  Social Connections: Moderately Isolated   Frequency of Communication with Friends and Family: More than three times a week   Frequency of Social  Gatherings with Friends and Family: More than three times a week   Attends Religious Services: More than 4 times per year   Active Member of Clubs or Organizations: No   Attends Archivist Meetings: Never   Marital Status: Widowed    Tobacco Counseling Counseling given: Not Answered   Clinical Intake:  Pre-visit preparation completed: Yes  Pain : No/denies pain     BMI - recorded: 23.59 Nutritional Status: BMI of 19-24  Normal Nutritional Risks: None Diabetes: No  How often do you need to have someone help you when you read instructions, pamphlets, or other written materials from your doctor or pharmacy?: 1 - Never  Diabetic?no  Interpreter Needed?: No  Information entered by :: Charlott Rakes, LPN   Activities of Daily Living In your present state of health, do you have any difficulty performing the following activities: 09/24/2021  Hearing? N  Vision? N  Difficulty concentrating or making decisions? N  Walking or climbing stairs? N  Dressing or bathing? N  Doing errands, shopping? N  Preparing Food and eating ? N  Using the Toilet? N  In the past six months, have you accidently leaked urine? N  Do you have problems with loss of bowel control? N  Managing your Medications? N  Managing your Finances? N  Housekeeping or managing your Housekeeping? N  Some  recent data might be hidden    Patient Care Team: Marin Olp, MD as PCP - General (Family Medicine) [name], MD as PCP - Cardiology (Cardiology) Ronald Lobo, MD as Consulting Physician (Gastroenterology) Deliah Goody, PA-C as Physician Assistant (Physician Assistant) Neldon Mc Donnamarie Poag, MD as Consulting Physician (Allergy and Immunology) Shon Hough, MD as Consulting Physician (Ophthalmology)  Indicate any recent Medical Services you may have received from other than [facility] providers in the past year (date may be approximate).     Assessment:   This is a routine wellness examination for Cindy Schwartz.  Hearing/Vision screen Hearing Screening - Comments:: Pt denies any hearing issues Vision Screening - Comments:: Pt follow up with [facility]thalmology for annual eye exams   Dietary issues and exercise activities discussed: Current Exercise Habits: The patient does not participate in regular exercise at present   Goals Addressed             This Visit's Progress    Patient Stated       Work on balance and memory       Depression Screen PHQ 2/9 Scores 09/24/2021 05/28/2021 12/20/2020 12/11/2020 09/10/2020 11/30/2019 08/25/2019  PHQ - 2 Score 0 0 0 0 0 0 0    Fall Risk Fall Risk  09/24/2021 05/28/2021 10/05/2020 09/10/2020 08/25/2019  Falls in the past year? 0 0 0 0 0  Number falls in past yr: 0 0 0 0 -  Injury with Fall? 0 0 0 0 0  Risk for fall due to : Impaired vision;Impaired balance/gait - - Impaired vision;Impaired balance/gait -  Follow up Falls prevention discussed - - Falls prevention discussed Falls evaluation completed;Education provided;Falls prevention discussed    FALL RISK PREVENTION PERTAINING TO THE HOME:  Any stairs in or around the home? Yes  If so, are there any without handrails? No  Home free of loose throw rugs in walkways, pet beds, electrical cords, etc? Yes  Adequate lighting in your home to reduce risk of falls? Yes   ASSISTIVE DEVICES  UTILIZED TO PREVENT FALLS:  Life alert? Yes  Use of a cane, walker or w/c?  No  Grab bars in the bathroom? No  Shower chair or bench in shower? No  Elevated toilet seat or a handicapped toilet? No   TIMED UP AND GO:  Was the test performed? No .   Cognitive Function: MMSE - Mini Mental State Exam 08/23/2020  Orientation to time 5  Orientation to Place 5  Registration 3  Attention/ Calculation 2  Recall 2  Language- name 2 objects 1  Language- repeat 1  Language- follow 3 step command 3  Language- read & follow direction 1  Write a sentence 1  Copy design 1  Total score 25     6CIT Screen 09/24/2021 09/10/2020  What Year? 0 points 0 points  What month? 0 points 0 points  What time? 0 points -  Count back from 20 0 points 0 points  Months in reverse 0 points 0 points  Repeat phrase 4 points 0 points  Total Score 4 -    Immunizations Immunization History  Administered Date(s) Administered   Fluad Quad(high Dose 65+) 06/01/2019, 06/11/2020, 06/26/2021   Influenza Split 06/26/2011, 07/07/2012   Influenza Whole 07/09/1999, 07/01/2007, 06/27/2008, 07/11/2009, 06/28/2010   Influenza, High Dose Seasonal PF 07/13/2013, 06/26/2015, 07/15/2017, 06/21/2018   Influenza,inj,Quad PF,6+ Mos 06/22/2014   Influenza,inj,quad, With Preservative 06/01/2019   Influenza-Unspecified 08/15/2016   PFIZER(Purple Top)SARS-COV-2 Vaccination 10/14/2019, 11/05/2019   Pneumococcal Polysaccharide-23 12/20/2012, 11/04/2018   Td 09/23/2003   Tdap 06/04/2020   Zoster, Live 01/19/2013    TDAP status: Up to date  Flu Vaccine status: Up to date  Pneumococcal vaccine status: Due, Education has been provided regarding the importance of this vaccine. Advised may receive this vaccine at local pharmacy or Health Dept. Aware to provide a copy of the vaccination record if obtained from local pharmacy or Health Dept. Verbalized acceptance and understanding.  Covid-19 vaccine status: Completed  vaccines  Qualifies for Shingles Vaccine? Yes   Zostavax completed No   Shingrix Completed?: No.    Education has been provided regarding the importance of this vaccine. Patient has been advised to call insurance company to determine out of pocket expense if they have not yet received this vaccine. Advised may also receive vaccine at local pharmacy or Health Dept. Verbalized acceptance and understanding.  Screening Tests Health Maintenance  Topic Date Due   Zoster Vaccines- Shingrix (1 of 2) Never done   Pneumonia Vaccine 28+ Years old (3 - PCV) 11/05/2019   TETANUS/TDAP  06/04/2030   INFLUENZA VACCINE  Completed   DEXA SCAN  Completed   HPV VACCINES  Aged Out   COVID-19 Vaccine  Discontinued    Health Maintenance  Health Maintenance Due  Topic Date Due   Zoster Vaccines- Shingrix (1 of 2) Never done   Pneumonia Vaccine 42+ Years old (3 - PCV) 11/05/2019    Colorectal cancer screening: No longer required.   Mammogram status: Completed 02/07/21. Repeat every year  Bone Density status: Completed 08/08/21. Results reflect: Bone density results: OSTEOPOROSIS. Repeat every 2 years.   Additional Screening:   Vision Screening: Recommended annual ophthalmology exams for early detection of glaucoma and other disorders of the eye. Is the patient up to date with their annual eye exam?  Yes  Who is the provider or what is the name of the office in which the patient attends annual eye exams? [facility] opthalmology If pt is not established with a provider, would they like to be referred to a provider to establish care? No .   Dental Screening: Recommended annual  dental exams for proper oral hygiene  Community Resource Referral / Chronic Care Management: CRR required this visit?  No   CCM required this visit?  No      Plan:     I have personally reviewed and noted the following in the patients chart:   Medical and social history Use of alcohol, tobacco or illicit drugs   Current medications and supplements including opioid prescriptions.  Functional ability and status Nutritional status Physical activity Advanced directives List of other physicians Hospitalizations, surgeries, and ER visits in previous 12 months Vitals Screenings to include cognitive, depression, and falls Referrals and appointments  In addition, I have reviewed and discussed with patient certain preventive protocols, quality metrics, and best practice recommendations. A written personalized care plan for preventive services as well as general preventive health recommendations were provided to patient.     Willette Brace, LPN   1/0/9323   Nurse Notes: None

## 2021-09-24 NOTE — Patient Instructions (Signed)
Cindy Schwartz , Thank you for taking time to come for your Medicare Wellness Visit. I appreciate your ongoing commitment to your health goals. Please review the following plan we discussed and let me know if I can assist you in the future.   Screening recommendations/referrals: Colonoscopy: no longer required Mammogram: done 02/07/21 repeat every year Bone Density: done 08/08/21 repeat every 2 years Recommended yearly ophthalmology/optometry visit for glaucoma screening and checkup Recommended yearly dental visit for hygiene and checkup  Vaccinations: Influenza vaccine: done 06/26/21 repeat every year Pneumococcal vaccine: due and discussed Tdap vaccine: done 06/04/20 repeat every 10 years  Shingles vaccine: Shingrix discussed. Please contact your pharmacy for coverage information.    Covid-19:completed 1/22 & 11/04/20  Advanced directives: Please bring a copy of your health care power of attorney and living will to the office at your convenience.  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 What does preventive care include? A yearly physical exam. This is also called an annual well check. Dental exams once or twice a year. Routine eye exams. Ask your health care provider how often you should have your eyes checked. Personal lifestyle choices, including: Daily care of your teeth and gums. Regular physical activity. Eating a healthy diet. Avoiding tobacco and drug use. Limiting alcohol use. Practicing safe sex. Taking low-dose aspirin every day. Taking vitamin and mineral supplements as recommended by your health care provider. What happens during an annual well check? The services and screenings done by your health care provider  during your annual well check will depend on your age, overall health, lifestyle risk factors, and family history of disease. Counseling  Your health care provider may ask you questions about your: Alcohol use. Tobacco use. Drug use. Emotional well-being. Home and relationship well-being. Sexual activity. Eating habits. History of falls. Memory and ability to understand (cognition). Work and work Statistician. Reproductive health. Screening  You may have the following tests or measurements: Height, weight, and BMI. Blood pressure. Lipid and cholesterol levels. These may be checked every 5 years, or more frequently if you are over 35 years old. Skin check. Lung cancer screening. You may have this screening every year starting at age 72 if you have a 30-pack-year history of smoking and currently smoke or have quit within the past 15 years. Fecal occult blood test (FOBT) of the stool. You may have this test every year starting at age 74. Flexible sigmoidoscopy or colonoscopy. You may have a sigmoidoscopy every 5 years or a colonoscopy every 10 years starting at age 49. Hepatitis C blood test. Hepatitis B blood test. Sexually transmitted disease (STD) testing. Diabetes screening. This is done by checking your blood sugar (glucose) after you have not eaten for a while (fasting). You may have this done every 1-3 years. Bone density scan. This is done to screen for osteoporosis. You may have this done starting at age 45. Mammogram. This may be done every 1-2 years. Talk to your health care provider about how often you should have regular mammograms. Talk with your health care provider about your test results, treatment options, and if necessary, the need for more tests. Vaccines  Your health care provider may recommend certain vaccines, such as: Influenza vaccine. This is recommended every year. Tetanus, diphtheria, and  acellular pertussis (Tdap, Td) vaccine. You may need a Td booster every  10 years. Zoster vaccine. You may need this after age 52. Pneumococcal 13-valent conjugate (PCV13) vaccine. One dose is recommended after age 74. Pneumococcal polysaccharide (PPSV23) vaccine. One dose is recommended after age 40. Talk to your health care provider about which screenings and vaccines you need and how often you need them. This information is not intended to replace advice given to you by your health care provider. Make sure you discuss any questions you have with your health care provider. Document Released: 10/05/2015 Document Revised: 05/28/2016 Document Reviewed: 07/10/2015 Elsevier Interactive Patient Education  2017 [place] Prevention in the Home Falls can cause injuries. They can happen to people of all ages. There are many things you can do to make your home safe and to help prevent falls. What can I do on the outside of my home? Regularly fix the edges of walkways and driveways and fix any cracks. Remove anything that might make you trip as you walk through a door, such as a raised step or threshold. Trim any bushes or trees on the path to your home. Use bright outdoor lighting. Clear any walking paths of anything that might make someone trip, such as rocks or tools. Regularly check to see if handrails are loose or broken. Make sure that both sides of any steps have handrails. Any raised decks and porches should have guardrails on the edges. Have any leaves, snow, or ice cleared regularly. Use sand or salt on walking paths during winter. Clean up any spills in your garage right away. This includes oil or grease spills. What can I do in the bathroom? Use night lights. Install grab bars by the toilet and in the tub and shower. Do not use towel bars as grab bars. Use non-skid mats or decals in the tub or shower. If you need to sit down in the shower, use a plastic, non-slip stool. Keep the floor dry. Clean up any water that spills on the floor as soon as it  happens. Remove soap buildup in the tub or shower regularly. Attach bath mats securely with double-sided non-slip rug tape. Do not have throw rugs and other things on the floor that can make you trip. What can I do in the bedroom? Use night lights. Make sure that you have a light by your bed that is easy to reach. Do not use any sheets or blankets that are too big for your bed. They should not hang down onto the floor. Have a firm chair that has side arms. You can use this for support while you get dressed. Do not have throw rugs and other things on the floor that can make you trip. What can I do in the kitchen? Clean up any spills right away. Avoid walking on wet floors. Keep items that you use a lot in easy-to-reach places. If you need to reach something above you, use a strong step stool that has a grab bar. Keep electrical cords out of the way. Do not use floor polish or wax that makes floors slippery. If you must use wax, use non-skid floor wax. Do not have throw rugs and other things on the floor that can make you trip. What can I do with my stairs? Do not leave any items on the stairs. Make sure that there are handrails on both sides of the stairs and use them. Fix handrails that are broken or loose. Make sure that  handrails are as long as the stairways. Check any carpeting to make sure that it is firmly attached to the stairs. Fix any carpet that is loose or worn. Avoid having throw rugs at the top or bottom of the stairs. If you do have throw rugs, attach them to the floor with carpet tape. Make sure that you have a light switch at the top of the stairs and the bottom of the stairs. If you do not have them, ask someone to add them for you. What else can I do to help prevent falls? Wear shoes that: Do not have high heels. Have rubber bottoms. Are comfortable and fit you well. Are closed at the toe. Do not wear sandals. If you use a stepladder: Make sure that it is fully opened.  Do not climb a closed stepladder. Make sure that both sides of the stepladder are locked into place. Ask someone to hold it for you, if possible. Clearly mark and make sure that you can see: Any grab bars or handrails. First and last steps. Where the edge of each step is. Use tools that help you move around (mobility aids) if they are needed. These include: Canes. Walkers. Scooters. Crutches. Turn on the lights when you go into a dark area. Replace any light bulbs as soon as they burn out. Set up your furniture so you have a clear path. Avoid moving your furniture around. If any of your floors are uneven, fix them. If there are any pets around you, be aware of where they are. Review your medicines with your doctor. Some medicines can make you feel dizzy. This can increase your chance of falling. Ask your doctor what other things that you can do to help prevent falls. This information is not intended to replace advice given to you by your health care provider. Make sure you discuss any questions you have with your health care provider. Document Released: 07/05/2009 Document Revised: 02/14/2016 Document Reviewed: 10/13/2014 Elsevier Interactive Patient Education  2017 [employer].

## 2021-09-26 ENCOUNTER — Ambulatory Visit: Payer: Self-pay

## 2021-09-26 ENCOUNTER — Ambulatory Visit (INDEPENDENT_AMBULATORY_CARE_PROVIDER_SITE_OTHER): Payer: Medicare Other | Admitting: Orthopedic Surgery

## 2021-09-26 ENCOUNTER — Encounter: Payer: Self-pay | Admitting: Orthopedic Surgery

## 2021-09-26 ENCOUNTER — Other Ambulatory Visit: Payer: Self-pay

## 2021-09-26 VITALS — BP 132/84 | HR 75 | Ht 65.0 in | Wt 143.2 lb

## 2021-09-26 DIAGNOSIS — M79642 Pain in left hand: Secondary | ICD-10-CM | POA: Diagnosis not present

## 2021-09-26 DIAGNOSIS — M79641 Pain in right hand: Secondary | ICD-10-CM

## 2021-09-26 DIAGNOSIS — M180 Bilateral primary osteoarthritis of first carpometacarpal joints: Secondary | ICD-10-CM

## 2021-09-26 MED ORDER — DICLOFENAC SODIUM 1 % EX GEL: 2.0000 g | Freq: Four times a day (QID) | CUTANEOUS | 0 refills | Status: AC

## 2021-09-26 NOTE — Progress Notes (Signed)
Office Visit Note   Patient: Cindy Schwartz           Date of Birth: 07/10/1934           MRN: [id number] Visit Date: 09/26/2021              Requested by: Marin Olp, MD [place],  [place] [id number] PCP: Marin Olp, MD   Assessment & Plan: Visit Diagnoses:  1. Bilateral hand pain   2. Primary osteoarthritis of both first carpometacarpal joints     Plan: We discussed the diagnosis, prognosis, non-operative and operative treatment options for thumb CMC arthritis.  After our discussion, the patient would like to proceed with topical NSAIDs and hand thearpy.  She is not interested in oral NSAIDs secondary to stomach issues.  We reviewed the risks and benefits of conservative management.  The patient expressed understanding of the reasoning and strategy going forward.  All patient questions and concerns were addressed.    Follow-Up Instructions: No follow-ups on file.   Orders:  Orders Placed This Encounter  Procedures   XR Hand Complete Right   XR Hand Complete Left   No orders of the defined types were placed in this encounter.     Procedures: No procedures performed   Clinical Data: No additional findings.   Subjective: Chief Complaint  Patient presents with   Left Hand - New Patient (Initial Visit)   Right Hand - New Patient (Initial Visit)    This is an 88 year old right-hand-dominant female who presents with bilateral radial hand pain for at least a month if not longer.  She describes pain at the bases of both of her thumbs.  The right hand seems to be worse than the left.  She also notes that her hands feel very weak.  She notes that she has difficulty with her activities around the house such as cooking.  She has worsening pain in the right thumb after turning a screw 1/2 months ago.  She not had any treatment for the pain so far.  She has minimal symptoms involving the wrist.  She denies any numbness or paresthesias in her  fingers.   Review of Systems   Objective: Vital Signs: BP 132/84 (BP Location: Left Arm, Patient Position: Sitting, Cuff Size: Large)    Pulse 75    Ht 5\' 5"  (1.651 m)    Wt 143 lb 3.2 oz (65 kg)    LMP  (LMP Unknown)    SpO2 98%    BMI 23.83 kg/m   Physical Exam Constitutional:      Appearance: Normal appearance.  Cardiovascular:     Rate and Rhythm: Normal rate.     Pulses: Normal pulses.  Pulmonary:     Effort: Pulmonary effort is normal.  Skin:    General: Skin is warm and dry.     Capillary Refill: Capillary refill takes less than 2 seconds.  Neurological:     Mental Status: She is alert.    Right Hand Exam   Tenderness  Right hand tenderness location: TTP at thumb CMC joint.  Other  Erythema: absent Sensation: normal Pulse: present  Comments:  Pain and crepitus with CMC grind test.  Minimal MP hyper-extension.  Negative Finkelstein test.  Negative Tinel at wrist.  No pain w/ A/PROM of the wrist.    Left Hand Exam   Tenderness  Left hand tenderness location: TTP at thumb CMC joint.   Other  Erythema: absent Sensation: normal  Pulse: present  Comments:  Pain and crepitus with CMC grind test.  Minimal MP hyper-extension.  Negative Finkelstein test.  Negative Tinel at wrist.  No pain w/ A/PROM of the wrist.      Specialty Comments:  No specialty comments available.  Imaging: X-rays of bilateral hands taken today reviewed interpreted by me.  They demonstrate scattered degenerative changes.  She does have osteoarthritis of the bilateral thumb CMC joints with joint space narrowing and subchondral sclerosis.  There is not appear to be evidence of osteoarthritis at the MP joints bilaterally.   PMFS History: Patient Active Problem List   Diagnosis Date Noted   Osteoarthritis of carpometacarpal (CMC) joints of both thumbs 09/26/2021   Pharyngoesophageal dysphagia 04/09/2021   Sinus pressure 04/09/2021   Pain due to onychomycosis of toenails of both feet  12/25/2020   History of cerebellar stroke 10/05/2020   Memory loss 10/05/2020   Arthritis of both knees 09/10/2020   Osteoarthritis of both hands 08/29/2020   Bilateral hand pain 08/29/2020   Bilateral carpal tunnel syndrome 08/29/2020   Trigger thumb of left hand 08/29/2020   History of adenomatous polyp of colon 12/30/2019   Mild hyperlipidemia 06/01/2019   Degenerative arthritis of knee, bilateral 04/08/2019   Chronic idiopathic constipation 03/01/2019   Chronic cough 01/21/2018   Genetic testing 07/09/2017   history of Breast cancer of lower-outer quadrant of right female breast  01/16/2016   Palpitations 11/13/2014   Normal coronary arteries 10/17/2014   Varicose 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 4.2-4.3cm in 2014.   Anxiety    Arthritis    Breast cancer ([place]) 2018   right mastectomy   Cancer ([place]) 1980   ovarian cancer   Compression fracture of L1 lumbar vertebra ([place])    COMPRESSION FRACTURE, THORACIC VERTEBRA 07/24/2010   Qualifier: Diagnosis of  By: [name], [name]    Elevated BP Transient 09/2014   Genetic testing 07/09/2017   Multi-Cancer panel (83 genes) @ [facility] - No pathogenic mutations detected   GERD (gastroesophageal reflux disease)    H/O bone density study    2015   H/O colonoscopy    History of ovarian cancer    November 1979   History of ovarian cancer 1979   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   Insomnia    Irregular heart beat    Normal coronary arteries Cath - 09/2014   Osteoporosis    Raynaud's disease     Family History  Problem Relation Age of Onset   Hip fracture Mother    Other Father        lived to 80   Healthy Brother    Colon polyps Brother    Breast cancer Cousin 55       mat first cousin related through uncle   Esophageal cancer Cousin 63       mat first cousin related through uncle  Past Surgical History:  Procedure Laterality Date   ABDOMINAL HYSTERECTOMY  1979   APPENDECTOMY     BILATERAL SALPINGOOPHORECTOMY     BREAST LUMPECTOMY     COLONOSCOPY  02/2012   LEFT HEART CATHETERIZATION WITH CORONARY ANGIOGRAM N/A 10/17/2014   Procedure: LEFT HEART CATHETERIZATION WITH CORONARY ANGIOGRAM;  Surgeon: [name], MD;  [facility] CATH LAB;  Service: Cardiovascular;  Laterality: N/A;   ovarian ca s/p taj/bso  1979   SIMPLE MASTECTOMY WITH AXILLARY SENTINEL NODE BIOPSY Right 05/05/2017   Procedure: RIGHT TOTAL MASTECTOMY WITH RIGHT  AXILLARY SENTINEL NODE BIOPSY;  Surgeon: [name], MD;  Location: [facility];  Service: General;  Laterality: Right;  PECTORAL BLOCK   Social History   Occupational History   Occupation: retired    Fish farm manager: RETIRED  Tobacco Use   Smoking status: Never   Smokeless tobacco: Never  Vaping Use   Vaping Use: Never used  Substance and Sexual Activity   Alcohol use: No   Drug use: No   Sexual activity: Not on file

## 2021-10-01 NOTE — Therapy (Signed)
OUTPATIENT OCCUPATIONAL THERAPY  EVALUATION  Patient Name: Cindy Schwartz MRN: [id number] DOB:07/20/1934, 88 y.o., female Today's Date: 10/02/2021  PCP: Marin Olp, MD REFERRING PROVIDER: Sherilyn Cooter, MD    10/02/21 1424  OT Visits / Re-Eval  Visit Number 1  Number of Visits 12  Date for OT Re-Evaluation 10/30/21  Authorization  Authorization Type [employer]  Authorization - Visit Number 1  Progress Note Due on Visit 10  OT Time Calculation  OT Start Time 1015  OT Stop Time 1102  OT Time Calculation (min) 47 min  End of Session  Activity Tolerance Patient tolerated treatment well  Behavior During Therapy WFL for tasks assessed/performed      Past Medical History:  Diagnosis Date   Aneurysm of ascending aorta    a. 4.2-4.3cm in 2014.   Anxiety    Arthritis    Breast cancer ([place]) 2018   right mastectomy   Cancer ([place]) 1980   ovarian cancer   Compression fracture of L1 lumbar vertebra ([place])    COMPRESSION FRACTURE, THORACIC VERTEBRA 07/24/2010   Qualifier: Diagnosis of  By: [name], [name]    Elevated BP Transient 09/2014   Genetic testing 07/09/2017   Multi-Cancer panel (83 genes) @ [facility] - No pathogenic mutations detected   GERD (gastroesophageal reflux disease)    H/O bone density study    2015   H/O colonoscopy    History of ovarian cancer    November 1979   History of ovarian cancer 1979   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   Insomnia    Irregular heart beat    Normal coronary arteries Cath - 09/2014   Osteoporosis    Raynaud's disease    Past Surgical History:  Procedure Laterality Date   ABDOMINAL HYSTERECTOMY  1979   APPENDECTOMY     BILATERAL SALPINGOOPHORECTOMY     BREAST LUMPECTOMY     COLONOSCOPY  02/2012   LEFT HEART CATHETERIZATION WITH CORONARY ANGIOGRAM N/A 10/17/2014   Procedure: LEFT HEART CATHETERIZATION WITH CORONARY ANGIOGRAM;  Surgeon: [name], MD;  Location: [facility] CATH LAB;  Service:  Cardiovascular;  Laterality: N/A;   ovarian ca s/p taj/bso  1979   SIMPLE MASTECTOMY WITH AXILLARY SENTINEL NODE BIOPSY Right 05/05/2017   Procedure: RIGHT TOTAL MASTECTOMY WITH RIGHT  AXILLARY SENTINEL NODE BIOPSY;  Surgeon: [name], MD;  Location: [facility];  Service: General;  Laterality: Right;  PECTORAL BLOCK   Patient Active Problem List   Diagnosis Date Noted   Osteoarthritis of carpometacarpal ([place]) joints of both thumbs 09/26/2021   Pharyngoesophageal dysphagia 04/09/2021   Sinus pressure 04/09/2021   Pain due to onychomycosis of toenails of both feet 12/25/2020   History of cerebellar stroke 10/05/2020   Memory loss 10/05/2020   Arthritis of both knees 09/10/2020   Osteoarthritis of both hands 08/29/2020   Bilateral hand pain 08/29/2020   Bilateral carpal tunnel syndrome 08/29/2020   Trigger thumb of left hand 08/29/2020   History of adenomatous polyp of colon 12/30/2019   Mild hyperlipidemia 06/01/2019   Degenerative arthritis of knee, bilateral 04/08/2019   Chronic idiopathic constipation 03/01/2019   Chronic cough 01/21/2018   Genetic testing 07/09/2017   history of Breast cancer of lower-outer quadrant of right female breast  01/16/2016   Palpitations 11/13/2014   Normal coronary arteries 10/17/2014   Varicose veins of lower extremities with other complications 65/46/5035   Hx of herpes simplex infection 12/18/2011   Low back pain 06/28/2010   Lower  abdominal pain 06/28/2010   Generalized anxiety disorder 10/13/2008   Irritable bowel syndrome 10/13/2008   Lung nodule 11/25/2007   Raynaud's syndrome 07/01/2007   Aneurysm of thoracic aorta 05/06/2007   Allergic rhinitis 05/06/2007   Esophageal reflux 05/06/2007   DEGENERATIVE DISC DISEASE, CERVICAL SPINE 05/06/2007   Osteoporosis 03/18/2007   History of ovarian cancer 09/22/1977   ONSET DATE: 09/26/21  REFERRING DIAG: Q00.867,Y19.509 (ICD-10-CM) - Bilateral hand pain M18.0 (ICD-10-CM) - Primary  osteoarthritis of both first carpometacarpal joints   THERAPY DIAG:  Bilateral hand OA at [place]. Right hand is worse than left. Pt is RHD.  SUBJECTIVE:  Pt reports that she is R HD and noticed pain off an on and now has pain at the base of both thumbs starting a few weeks ago after turning a screw. Her right hand is worse than the left. She also reports generalized weakness and difficulty doing daily activities as she used to.  SUBJECTIVE STATEMENT:  Patient is an 88 year old right-hand-dominant female who presents per Dr Tempie Donning with bilateral radial hand pain for at least a month.  She describes pain at the bases of both of her thumb/CMC joints after performing functional activity.  The right hand seems to be worse than the left. She also reports that her hands feel weak bilaterally, with overall decreased grip strength.  She has difficulty with ADL's, cooking, cleaning, shopping etc...  She has worsening pain in the right thumb after turning a screw 1-2 months ago per her report. She denies symptoms involving her wrists and denies numbness or paresthesias in her fingers.           PERTINENT HISTORY:  OA bialteral CMC of thumbs, R > L H/o bilateral CTS 12/21, Raynaud's Syndrome, memory loss, cerebellar stroke 10/05/20, h/o breast cancer R, ovarian cancer, osteoporosis, DDD cervical spine. Please refer to chart for complete past medical history.                                                                                                                                                                                          PAIN:  Are you having pain? Yes Pain scale: 3/10 Pain location: Bilateral thumbs/CMC joints PAIN TYPE: aching and sore after functional use. R > L Pain description: intermittent  Aggravating factors: Increased functional use, pinch and grip  Relieving factors: Rest, heat  PRECAUTIONS: None  HAND DOMINANCE: Right  FALLS: Has patient fallen in last 6 months? No,    PLOF: Independent  PATIENT GOALS  "Get use of hands back". Possible splinting and pain relief per pt report.  OBJECTIVE:   DIAGNOSTIC FINDINGS: X-rays of bilateral hands  taken today reviewed interpreted by Dr Tempie Donning. They demonstrate scattered degenerative changes. Osteoarthritis of the bilateral thumb CMC joints with joint space narrowing and subchondral sclerosis. There does not appear to be evidence of osteoarthritis at the MP joints bilaterally.   COGNITION: Within functional limits for tasks assessed  ADLs: Overall ADLs: Pt reports difficulty doing daily activity such as peeling and slicing etc. She noticed that this has caused her to feel as though her hands are weak. She states that she has "clumsy hands" but is overall Mod I for bathing and dressing (buttons, zippers can take longer).  WRITTEN EXPRESSION:  Dominant hand: right  SENSATION: Light touch: Appears intact   COORDINATION: Comments: Opposition to tip of small fingers bilaterally with c/o thumb CMC soreness.   EDEMA: Minimal edema noted bilateral hands/fingers.  PALPATION: Pt is positive bilaterally for grind test. Pt is noted to be TTP bilateral hands, CMC joints. Negative Finklestein's test  bilaterally, negative for Tinels at carpal tunnel. A/ROM bilateral wrists is WFL and pain free.   UE AROM/PROM:  A/PROM Right 10/02/2021 Left 10/02/2021  Shoulder flexion    Shoulder abduction    Shoulder adduction    Shoulder extension    Shoulder internal rotation    Shoulder external rotation    Elbow flexion [facility] WFL  Elbow extension [facility] WFL  Wrist flexion [facility] WFL  Wrist extension [facility] WFL  Wrist ulnar deviation NT NT  Wrist radial deviation NT NT  Wrist pronation    Wrist supination    (Blank rows = not tested)  HAND A/PROM:  A/PROM Right 10/02/2021 Left 10/02/2021  Thumb MCP (0-60) 54 70  Thumb IP (0-80) 52 64  Thumb Radial abd/add (0-55) NT NT  Thumb Palmar abd/add (0-45) NT NT  Thumb opposition  to index yes yes  Index MCP (0-90)    Index PIP (0-100)    Index DIP (0-70) OA at DIP   Long MCP (0-90)    Long PIP (0-100)    Long DIP (0-70)    Ring MCP (0-90_    Ring PIP (0-100)    Ring DIP (0-70)    Little MCP (0-90)    Little PIP (0-100)    Little DIP (0-70) OA at DIP   (Blank rows = not tested) Pt is able to oppose to tip of small fingers bilaterally w/ c/o pain after performing. She is able to make a full fist bilaterally as well.  HAND FUNCTION:  Grip strength: Right: 27.8 lbs; Left: 34.9 lbs Lateral pinch: Right: 8.5 lbs, Left: 10 lbs 3 point pinch: Right: 9 lbs, Left: 9.5 lbs  Comments: Pt reports that she is active and "I can do things but I am in pain"  TODAY'S TREATMENT:  10/02/21 1) Pt was instructed in an initial home program for Joint Protection for bilateral UE's. She was instructed to use larger joints for ADL's and homemaking activities and examples were discussed/provided (including use of heat warmers secondary to Raynaud's when walking in cold weather, use of wide grip handles on utensils (specifically peelers, paring knife, jar openers, etc), positioning of UE's to carry items (palms up, use of larger joints when possible, secondary to pt report of difficulty doing daily activities. Handout was issued and reviewed in the clinic today.  2) Pt was also issued bilateral neoprene thumb spica splints that allow for A/ROM and functional use of bilateral hands but provide support to her CMC joints. Pt will wear these for activity such as driving, cleaning, shopping etc.  Pt was educated in splinting use, care and precautions.   3) Pt was also instructed to make a list of difficult activities over the next week and we will brainstorm either a/e or new ways of performing these at her next visit.  PATIENT EDUCATION: Education details: Splint use, care and precautions, joint protection techniques. Person educated: Patient Education method: Explanation, Demonstration,  Verbal cues, and Handouts Education comprehension: verbalized understanding and needs further education   HOME EXERCISE PROGRAM: N/A  ASSESSMENT:  CLINICAL IMPRESSION: Patient is a 88 y.o. right hand dominant female who was seen today for occupational therapy evaluation and treatment for bilateral hand CMC joint OA. Patient has performance deficits in functional skills including ADLs, IADLs, coordination, strength, and pain. These impairments are limiting patient from ADLs, IADLs, work, and leisure. Patient may have co-morbidities  that affects occupational performance. Patient will benefit from skilled OT to address above impairments and improve overall function.  MODIFICATION OR ASSISTANCE TO COMPLETE EVALUATION: No modification of tasks or assist necessary to complete an evaluation.  OT OCCUPATIONAL PROFILE AND HISTORY: Problem focused assessment: Including review of records relating to presenting problem.  CLINICAL DECISION MAKING: LOW - limited treatment options, no task modification necessary  REHAB POTENTIAL: Good  EVALUATION COMPLEXITY: Low    GOALS: Goals reviewed with patient? No  SHORT TERM GOALS: (STG required if POC>30 days)  STG Name Target Date Goal status  1 Pt will be Mod I splinting use, care and precautions bilateral hands/thumbs Baseline:  10/30/2021 INITIAL  2 Pt will be Mod I initial home program and A/ROM for arthritis  Baseline:  10/30/2021 INITIAL                            LONG TERM GOALS:   LTG Name Target Date Goal status  1 Pt will be Mod I stating 2-3 items of a/e that may be used for ADL's and increased independence Baseline:  11/27/2021 INITIAL  2 Pt will report pain as 2/10 or less implementing joint protection techniques during ADL/homemaking tasks Baseline:  11/27/2021 INITIAL  3 Pt will be Mod I upgraded pain free HEP for bilateral UE's as observed in clinic Baseline:  11/27/2021 INITIAL                       PLAN: OT FREQUENCY:  1-2x/week  OT DURATION: 8 weeks  PLANNED INTERVENTIONS: self care/ADL training, therapeutic exercise, therapeutic activity, manual therapy, splinting, ultrasound, fluidotherapy, moist heat, patient/family education, and DME and/or AE instructions  PLAN FOR NEXT SESSION: Instruct in home program for A/ROM, Splint check/adjustments, A/E instruction and joint protection techniques.  CONSULTED AND AGREED WITH PLAN OF CARE: Patient   Almyra Deforest, OTR/L 10/02/2021, 2:29 PM  [facility] [address] [place], [place], [address] Phone: [phone]   Fax:  [phone]  Patient name: Elora Wolter MRN: [id number] DOB: [birth year]

## 2021-10-02 ENCOUNTER — Encounter: Payer: Self-pay | Admitting: Occupational Therapy

## 2021-10-02 ENCOUNTER — Ambulatory Visit (INDEPENDENT_AMBULATORY_CARE_PROVIDER_SITE_OTHER): Payer: Medicare Other | Admitting: Occupational Therapy

## 2021-10-02 ENCOUNTER — Other Ambulatory Visit: Payer: Self-pay

## 2021-10-02 DIAGNOSIS — M25541 Pain in joints of right hand: Secondary | ICD-10-CM

## 2021-10-02 DIAGNOSIS — R278 Other lack of coordination: Secondary | ICD-10-CM

## 2021-10-02 DIAGNOSIS — M25542 Pain in joints of left hand: Secondary | ICD-10-CM

## 2021-10-02 DIAGNOSIS — M6281 Muscle weakness (generalized): Secondary | ICD-10-CM | POA: Diagnosis not present

## 2021-10-02 DIAGNOSIS — M25641 Stiffness of right hand, not elsewhere classified: Secondary | ICD-10-CM

## 2021-10-02 DIAGNOSIS — M25642 Stiffness of left hand, not elsewhere classified: Secondary | ICD-10-CM

## 2021-10-02 DIAGNOSIS — R600 Localized edema: Secondary | ICD-10-CM

## 2021-10-02 NOTE — Patient Instructions (Signed)
Joint Protection (Carrying)    Avoid carrying items with weight on fingers. Solution: Use a shoulder bag or a back pack.  Joint Protection (Grip)    Avoid: grasping thin utensils for prolonged periods. Solution: Hold thick-handled tools in dagger fashion when-ever possible for performing tasks such as stirring or scrub-bing. Relax fingers every 10 minutes during activity.  Joint Protection (Lifting)    Avoid picking up heavy items with one hand. Solution: Use both hands, and slide item whenever possible.  Joint Protection (Pinch)    Avoid tight pinch, such as when holding a pen. Solution: Use a thick pen with a felt tip to reduce pressure on fingers. Joint Protection (Ulnar Deviation)    Avoid positions that cause fingers to lean sideways toward little finger. Solution: Use devices like jar-openers to assist in activities.  Joint Protection (Use Large Joints)    Avoid placing pressure on fingertips. Solution: Transfer work to other parts of body which are not affected or which have greater strength. Using body weight to push heavy doors open is an example.  Joint Protection (Weight Bearing)    Avoid leaning on knuckles. Solution: Open fingers and use pad of hand when needed. Put extra cushions or folded blanket on seats to avoid using hands for pushing up to stand.  Joint Protection (Weight Bearing)   Joint Protection (Wringing)    Avoid wringing towels by twisting. Solution: Loop towel around sink faucet as if braiding and pull gently, or let drip-dry.

## 2021-10-09 ENCOUNTER — Other Ambulatory Visit: Payer: Self-pay

## 2021-10-09 ENCOUNTER — Ambulatory Visit: Payer: Medicare Other | Admitting: Occupational Therapy

## 2021-10-09 ENCOUNTER — Encounter: Payer: Self-pay | Admitting: Occupational Therapy

## 2021-10-09 DIAGNOSIS — R278 Other lack of coordination: Secondary | ICD-10-CM

## 2021-10-09 DIAGNOSIS — M25542 Pain in joints of left hand: Secondary | ICD-10-CM

## 2021-10-09 DIAGNOSIS — M25541 Pain in joints of right hand: Secondary | ICD-10-CM

## 2021-10-09 DIAGNOSIS — R600 Localized edema: Secondary | ICD-10-CM

## 2021-10-09 DIAGNOSIS — M25642 Stiffness of left hand, not elsewhere classified: Secondary | ICD-10-CM

## 2021-10-09 DIAGNOSIS — M25641 Stiffness of right hand, not elsewhere classified: Secondary | ICD-10-CM

## 2021-10-09 DIAGNOSIS — M6281 Muscle weakness (generalized): Secondary | ICD-10-CM

## 2021-10-09 NOTE — Patient Instructions (Addendum)
Arthritis Arthritis means joint pain. It can also mean joint disease. A joint is a place where bones come together. There are more than 100 types of arthritis. What are the causes? This condition may be caused by: Wear and tear of a joint. This is the most common cause. A lot of acid in the blood, which leads to pain in the joint (gout). Pain and swelling (inflammation) in a joint. Infection of a joint. Injuries in the joint. A reaction to medicines (allergy). In some cases, the cause may not be known. What are the signs or symptoms? How is this treated? This condition may be treated with: Treating the cause, if it is known. Rest. Raising (elevating) the joint. Putting cold or hot packs on the joint. Medicines to treat symptoms and reduce pain and swelling. Shots of medicines (cortisone) into the joint. You may also be told to make changes in your life, such as doing exercises and losing weight. Follow these instructions at home: Medicines Take over-the-counter and prescription medicines only as told by your doctor. Do not take aspirin for pain if your doctor says that you may have gout. Activity Rest your joint if your doctor tells you to. Avoid activities that make the pain worse. Exercise your joint regularly as told by your doctor. Try doing exercises like: Swimming. Water aerobics. Biking. Walking. Managing pain, stiffness, and swelling   If told, put ice on the affected area. Put ice in a plastic bag. Place a towel between your skin and the bag. Leave the ice on for 20 minutes, 2-3 times per day. If your joint is swollen, raise (elevate) it above the level of your heart if told by your doctor. If your joint feels stiff in the morning, try taking a warm shower. If told, put heat on the affected area. Do this as often as told by your doctor. Use the heat source that your doctor recommends, such as a moist heat pack or a heating pad. If you have diabetes, do not apply heat  without asking your doctor. To apply heat: Place a towel between your skin and the heat source. Leave the heat on for 20-30 minutes. Remove the heat if your skin turns bright red. This is very important if you are unable to feel pain, heat, or cold. You may have a greater risk of getting burned. General instructions Do not use any products that contain nicotine or tobacco, such as cigarettes, e-cigarettes, and chewing tobacco. If you need help quitting, ask your doctor. Keep all follow-up visits as told by your doctor. This is important. Contact a doctor if: The pain gets worse. You have a fever. Get help right away if: You have very bad pain in your joint. You have swelling in your joint. Your joint is red. Many joints become painful and swollen. You have very bad back pain. Your leg is very weak. You cannot control your pee (urine) or poop (stool). Summary Arthritis means joint pain. It can also mean joint disease. A joint is a place where bones come together. The most common cause of this condition is wear and tear of a joint. Symptoms of this condition include redness, swelling, or stiffness of the joint. This condition is treated with rest, raising the joint, medicines, and putting cold or hot packs on the joint. Follow your doctor's instructions about medicines, activity, exercises, and other home care treatments. This information is not intended to replace advice given to you by your health care provider. Make sure  you discuss any questions you have with your health care provider. Osteoarthritis Osteoarthritis is a type of arthritis. It refers to joint pain or joint disease. Osteoarthritis affects tissue that covers the ends of bones in joints (cartilage). Cartilage acts as a cushion between the bones and helps them move smoothly. Osteoarthritis occurs when cartilage in the joints gets worn down. Osteoarthritis is sometimes called "wear and tear" arthritis. Osteoarthritis is the  most common form of arthritis. It often occurs in older people. It is a condition that gets worse over time. The joints most often affected by this condition are in the fingers, toes, hips, knees, and spine, including the neck and lower back. What are the causes? This condition is caused by the wearing down of cartilage that covers the ends of bones. What increases the risk? The following factors may make you more likely to develop this condition: Being age 64 or older. Obesity. Overuse of joints. Past injury of a joint. Past surgery on a joint. Family history of osteoarthritis. What are the signs or symptoms? The main symptoms of this condition are pain, swelling, and stiffness in the joint. Other symptoms may include: An enlarged joint. More pain and further damage caused by small pieces of bone or cartilage that break off and float inside of the joint. Small deposits of bone (osteophytes) that grow on the edges of the joint. A grating or scraping feeling inside the joint when you move it. Popping or creaking sounds when you move. Difficulty walking or exercising. An inability to grip items, twist your hand(s), or control the movements of your hands and fingers. How is this diagnosed? This condition may be diagnosed based on: Your medical history. A physical exam. Your symptoms. X-rays of the affected joint(s). Blood tests to rule out other types of arthritis. How is this treated? There is no cure for this condition, but treatment can help control pain and improve joint function. Treatment may include a combination of therapies, such as: Pain relief techniques, such as: Applying heat and cold to the joint. Massage. A form of talk therapy called cognitive behavioral therapy (CBT). This therapy helps you set goals and follow up on the changes that you make. Medicines for pain and inflammation. The medicines can be taken by mouth or applied to the skin. They include: NSAIDs, such as  ibuprofen. Prescription medicines. Strong anti-inflammatory medicines (corticosteroids). Certain nutritional supplements. A prescribed exercise program. You may work with a physical therapist. Assistive devices, such as a brace, wrap, splint, specialized glove, or cane. A weight control plan. Surgery, such as: An osteotomy. This is done to reposition the bones and relieve pain or to remove loose pieces of bone and cartilage. Joint replacement surgery. You may need this surgery if you have advanced osteoarthritis. Follow these instructions at home: Activity Rest your affected joints as told by your health care provider. Exercise as told by your health care provider. He or she may recommend specific types of exercise, such as: Strengthening exercises. These are done to strengthen the muscles that support joints affected by arthritis. Aerobic activities. These are exercises, such as brisk walking or water aerobics, that increase your heart rate. Range-of-motion activities. These help your joints move more easily. Balance and agility exercises. Managing pain, stiffness, and swelling   If directed, apply heat to the affected area as often as told by your health care provider. Use the heat source that your health care provider recommends, such as a moist heat pack or a heating pad.  If you have a removable assistive device, remove it as told by your health care provider. Place a towel between your skin and the heat source. If your health care provider tells you to keep the assistive device on while you apply heat, place a towel between the assistive device and the heat source. Leave the heat on for 20-30 minutes. Remove the heat if your skin turns bright red. This is especially important if you are unable to feel pain, heat, or cold. You may have a greater risk of getting burned. If directed, put ice on the affected area. To do this: If you have a removable assistive device, remove it as told by  your health care provider. Put ice in a plastic bag. Place a towel between your skin and the bag. If your health care provider tells you to keep the assistive device on during icing, place a towel between the assistive device and the bag. Leave the ice on for 20 minutes, 2-3 times a day. Move your fingers or toes often to reduce stiffness and swelling. Raise (elevate) the injured area above the level of your heart while you are sitting or lying down. General instructions Take over-the-counter and prescription medicines only as told by your health care provider. Maintain a healthy weight. Follow instructions from your health care provider for weight control. Do not use any products that contain nicotine or tobacco, such as cigarettes, e-cigarettes, and chewing tobacco. If you need help quitting, ask your health care provider. Use assistive devices as told by your health care provider. Keep all follow-up visits as told by your health care provider. This is important. Where to find more information [employer] of Arthritis and Musculoskeletal and Skin Diseases: www.niams.SouthExposed.es [employer] on Aging: http://kim-miller.com/ American College of Rheumatology: www.rheumatology.org Contact a health care provider if: You have redness, swelling, or a feeling of warmth in a joint that gets worse. You have a fever along with joint or muscle aches. You develop a rash. You have trouble doing your normal activities. Get help right away if: You have pain that gets worse and is not relieved by pain medicine. Summary Osteoarthritis is a type of arthritis that affects tissue covering the ends of bones in joints (cartilage). This condition is caused by the wearing down of cartilage that covers the ends of bones. The main symptom of this condition is pain, swelling, and stiffness in the joint. There is no cure for this condition, but treatment can help control pain and improve joint function. This  information is not intended to replace advice given to you by your health care provider. Make sure you discuss any questions you have with your health care provider. Document Revised: 09/05/2019 Document Reviewed: 09/05/2019 Flexor Tendon Gliding (Active Full Fist)    Straighten all fingers, then make a fist, bending all joints. Repeat ____ times. Do ____ sessions per day.  CFlexor Tendon Gliding (Active Straight Fist)    Start with fingers straight. Bend knuckles and middle joints. Keep fingertip joints straight to touch base of palm. Repeat ____ times. Do ____ sessions per day.  Opposition (Active)    Touch tip of thumb to nail tip of each finger in turn, making an "O" shape. Repeat ____ times. Do ____ sessions per day.  Circumduction (Active)    With fingers curled, move slowly at wrist in clock- wise circles ____ times. Repeat counterclockwise. Do not move elbow or shoulder. Do ____ sessions per day.  Extension (Active With Finger Flexion)  Bend hand back at wrist. Hold ____ seconds. Repeat ____ times. Do ____ sessions per day. Flexion (Active)    Bend wrist down  Repeat ____ times. Do ____ sessions per day.  Copyright  VHI. All rights reserved.   Marland Kitchen

## 2021-10-09 NOTE — Therapy (Signed)
[facility] [address] [place], [place], [address] Phone: [phone]   Fax:  [phone]  Occupational Therapy Treatment  Patient Details  Name: Cindy Schwartz MRN: [id number] Date of Birth: 12/12/1933 No data recorded  Encounter Date: 10/09/2021   OT End of Session - 10/09/21 1629     Visit Number 2    Number of Visits 12    Date for OT Re-Evaluation 10/30/21    Authorization Type [employer] Medicare    Authorization - Visit Number 2    Progress Note Due on Visit 10    OT Start Time 1017    OT Stop Time 1100    OT Time Calculation (min) 43 min    Activity Tolerance Patient tolerated treatment well    Behavior During Therapy [facility] for tasks assessed/performed             Past Medical History:  Diagnosis Date   Aneurysm of ascending aorta    a. 4.2-4.3cm in 2014.   Anxiety    Arthritis    Breast cancer ([place]) 2018   right mastectomy   Cancer ([place]) 1980   ovarian cancer   Compression fracture of L1 lumbar vertebra ([place])    COMPRESSION FRACTURE, THORACIC VERTEBRA 07/24/2010   Qualifier: Diagnosis of  By: [name], [name]    Elevated BP Transient 09/2014   Genetic testing 07/09/2017   Multi-Cancer panel (83 genes) @ [facility] - No pathogenic mutations detected   GERD (gastroesophageal reflux disease)    H/O bone density study    2015   H/O colonoscopy    History of ovarian cancer    November 1979   History of ovarian cancer 1979   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   Insomnia    Irregular heart beat    Normal coronary arteries Cath - 09/2014   Osteoporosis    Raynaud's disease     Past Surgical History:  Procedure Laterality Date   ABDOMINAL HYSTERECTOMY  1979   APPENDECTOMY     BILATERAL SALPINGOOPHORECTOMY     BREAST LUMPECTOMY     COLONOSCOPY  02/2012   LEFT HEART CATHETERIZATION WITH CORONARY ANGIOGRAM N/A 10/17/2014   Procedure: LEFT HEART CATHETERIZATION WITH CORONARY ANGIOGRAM;  Surgeon: [name], MD;  Location: [facility] CATH LAB;  Service: Cardiovascular;  Laterality: N/A;   ovarian ca s/p taj/bso  1979   SIMPLE MASTECTOMY WITH AXILLARY SENTINEL NODE BIOPSY Right 05/05/2017   Procedure: RIGHT TOTAL MASTECTOMY WITH RIGHT  AXILLARY SENTINEL NODE BIOPSY;  Surgeon: [name], MD;  Location: [facility];  Service: General;  Laterality: Right;  PECTORAL BLOCK    There were no vitals filed for this visit.   Subjective Assessment - 10/09/21 1020     Subjective  Pt reports that she has been unable to wear the neoprene thumb spica splints. She denies pain but reports weakness bilateral hands.    Patient Stated Goals Increased use of hands, increased strength    Currently in Pain? No/denies    Multiple Pain Sites No               OT Treatments/Exercises (OP) - 10/09/21 0001       ADLs   ADL Comments Discussed splint use at home and pt states that neoprene splints were too tight on her thumb/IP's joints. Pt was isssued size large today for bettter fit, comfort and support bilateral thumb CMC's. Verbal review of splinting use, care and precautions with pt. Reviewed all joint protection  principles with pt with focus on respect pain and rest PRN. Neutral wrist for functional activity and assistive devices explored (elastic laces in dress shoes, button hook, paraffin unti to warm hands and rechargable hand warmers when walking out in the cold etc. Pt was educated again to make a list of difficult ADL and homemaking tasks that we can brainstorm solutions or a/e to increase independence and decrease pain/symptoms of OA at home. Issued handout on Arthritis and OA - reviewed in clinic with pt.   30 min     Exercises   Exercises Wrist;Hand      Wrist Exercises   Other wrist exercises Pt was educated in a HEP to include gentle A/ROM for bilateral wrists (flexion, extension, wrist circles or figure 8's.      Hand Exercises   Other Hand Exercises Gentle active tendon gliding ex's added to  home program (avoid composite thumb flexion but pain free opposition to tip of fingers). Handout issued and reviewed in clinic with pt.                Plan - 10/09/21 1641     Clinical Impression Statement Pt should benefit from handouts on arthritis and gentle A/ROM ex's as issued today. Reviewed joint protection principles and respecting pain, need for rest based on day to day symptoms. Pt will make a list of difficult activitiies (ADL/homemaking) that we can brainstorm solutions to in clinic at her next visit (a/e vs positioning changes).    OT Occupational Profile and History Problem Focused Assessment - Including review of records relating to presenting problem    Occupational performance deficits (Please refer to evaluation for details): ADL's    Body Structure / Function / Physical Skills ADL;Strength;Pain;Dexterity;Edema;UE functional use;ROM;Coordination;Flexibility;Decreased knowledge of precautions;FMC    Rehab Potential Fair    Clinical Decision Making Limited treatment options, no task modification necessary    Comorbidities Affecting Occupational Performance: None    Modification or Assistance to Complete Evaluation  No modification of tasks or assist necessary to complete eval    OT Frequency Other (comment)   1-2x/week   OT Duration 8 weeks    OT Treatment/Interventions Self-care/ADL training;Fluidtherapy;DME and/or AE instruction;Splinting;Therapeutic activities;Ultrasound;Therapeutic exercise;Paraffin;Manual Therapy;Patient/family education    Plan Joint protection techniques and a/e review based on pt difficulties at home for ADL/homemaking tasks, HEP review.    Consulted and Agree with Plan of Care Patient             Patient will benefit from skilled therapeutic intervention in order to improve the following deficits and impairments:   Body Structure / Function / Physical Skills: ADL, Strength, Pain, Dexterity, Edema, UE functional use, ROM, Coordination,  Flexibility, Decreased knowledge of precautions, [facility]    Visit Diagnosis: Pain in joint of left hand  Pain in joint of right hand  Localized edema  Muscle weakness (generalized)  Stiffness of left hand, not elsewhere classified  Stiffness of right hand, not elsewhere classified  Other lack of coordination    Problem List Patient Active Problem List   Diagnosis Date Noted   Osteoarthritis of carpometacarpal (CMC) joints of both thumbs 09/26/2021   Pharyngoesophageal dysphagia 04/09/2021   Sinus pressure 04/09/2021   Pain due to onychomycosis of toenails of both feet 12/25/2020   History of cerebellar stroke 10/05/2020   Memory loss 10/05/2020   Arthritis of both knees 09/10/2020   Osteoarthritis of both hands 08/29/2020   Bilateral hand pain 08/29/2020   Bilateral carpal tunnel syndrome 08/29/2020  Trigger thumb of left hand 08/29/2020   History of adenomatous polyp of colon 12/30/2019   Mild hyperlipidemia 06/01/2019   Degenerative arthritis of knee, bilateral 04/08/2019   Chronic idiopathic constipation 03/01/2019   Chronic cough 01/21/2018   Genetic testing 07/09/2017   history of Breast cancer of lower-outer quadrant of right female breast  01/16/2016   Palpitations 11/13/2014   Normal coronary arteries 10/17/2014   Varicose veins of lower extremities with other complications 41/74/0814   Hx of herpes simplex infection 12/18/2011   Low back pain 06/28/2010   Lower abdominal pain 06/28/2010   Generalized anxiety disorder 10/13/2008   Irritable bowel syndrome 10/13/2008   Lung nodule 11/25/2007   Raynaud's syndrome 07/01/2007   Aneurysm of thoracic aorta 05/06/2007   Allergic rhinitis 05/06/2007   Esophageal reflux 05/06/2007   DEGENERATIVE DISC DISEASE, CERVICAL SPINE 05/06/2007   Osteoporosis 03/18/2007   History of ovarian cancer 09/22/1977    Almyra Deforest, OT 10/09/2021, 4:49 PM  [facility] [address] [place], [place], [address] Phone: [phone]   Fax:  [phone]  Name: Cindy Schwartz MRN: [id number] Date of Birth: 09/07/1934

## 2021-10-11 NOTE — Telephone Encounter (Signed)
Hi Dr. Fuller Plan,   Patient called requested a transfer of care from Dr. Cristina Gong from [facility] over to you.   Records are being sent to you for review.   Please advise on scheduling.  Thank you

## 2021-10-16 ENCOUNTER — Other Ambulatory Visit: Payer: Self-pay

## 2021-10-16 ENCOUNTER — Encounter: Payer: Self-pay | Admitting: Occupational Therapy

## 2021-10-16 ENCOUNTER — Ambulatory Visit (INDEPENDENT_AMBULATORY_CARE_PROVIDER_SITE_OTHER): Payer: Medicare Other | Admitting: Occupational Therapy

## 2021-10-16 DIAGNOSIS — M25642 Stiffness of left hand, not elsewhere classified: Secondary | ICD-10-CM

## 2021-10-16 DIAGNOSIS — R278 Other lack of coordination: Secondary | ICD-10-CM

## 2021-10-16 DIAGNOSIS — M25541 Pain in joints of right hand: Secondary | ICD-10-CM

## 2021-10-16 DIAGNOSIS — R600 Localized edema: Secondary | ICD-10-CM

## 2021-10-16 DIAGNOSIS — M6281 Muscle weakness (generalized): Secondary | ICD-10-CM | POA: Diagnosis not present

## 2021-10-16 DIAGNOSIS — M25641 Stiffness of right hand, not elsewhere classified: Secondary | ICD-10-CM

## 2021-10-16 DIAGNOSIS — M25542 Pain in joints of left hand: Secondary | ICD-10-CM

## 2021-10-16 NOTE — Therapy (Signed)
[facility] [address] [place], [place], [address] Phone: [phone]   Fax:  [phone]  Occupational Therapy Treatment  Patient Details  Name: Cindy Schwartz MRN: [id number] Date of Birth: 01/23/1934 No data recorded  Encounter Date: 10/16/2021   OT End of Session - 10/16/21 1211     Visit Number 3    Number of Visits 12    Date for OT Re-Evaluation 10/30/21    Authorization Type [employer] Medicare    Authorization - Visit Number 3    Progress Note Due on Visit 10    OT Start Time 1020    OT Stop Time 1106    OT Time Calculation (min) 46 min    Activity Tolerance Patient tolerated treatment well    Behavior During Therapy [facility] for tasks assessed/performed             Past Medical History:  Diagnosis Date   Aneurysm of ascending aorta    a. 4.2-4.3cm in 2014.   Anxiety    Arthritis    Breast cancer ([place]) 2018   right mastectomy   Cancer ([place]) 1980   ovarian cancer   Compression fracture of L1 lumbar vertebra ([place])    COMPRESSION FRACTURE, THORACIC VERTEBRA 07/24/2010   Qualifier: Diagnosis of  By: [name], [name]    Elevated BP Transient 09/2014   Genetic testing 07/09/2017   Multi-Cancer panel (83 genes) @ [facility] - No pathogenic mutations detected   GERD (gastroesophageal reflux disease)    H/O bone density study    2015   H/O colonoscopy    History of ovarian cancer    November 1979   History of ovarian cancer 1979   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   Insomnia    Irregular heart beat    Normal coronary arteries Cath - 09/2014   Osteoporosis    Raynaud's disease     Past Surgical History:  Procedure Laterality Date   ABDOMINAL HYSTERECTOMY  1979   APPENDECTOMY     BILATERAL SALPINGOOPHORECTOMY     BREAST LUMPECTOMY     COLONOSCOPY  02/2012   LEFT HEART CATHETERIZATION WITH CORONARY ANGIOGRAM N/A 10/17/2014   Procedure: LEFT HEART CATHETERIZATION WITH CORONARY ANGIOGRAM;  Surgeon: [name], MD;  Location: [facility] CATH LAB;  Service: Cardiovascular;  Laterality: N/A;   ovarian ca s/p taj/bso  1979   SIMPLE MASTECTOMY WITH AXILLARY SENTINEL NODE BIOPSY Right 05/05/2017   Procedure: RIGHT TOTAL MASTECTOMY WITH RIGHT  AXILLARY SENTINEL NODE BIOPSY;  Surgeon: [name], MD;  Location: [place];  Service: General;  Laterality: Right;  PECTORAL BLOCK    There were no vitals filed for this visit.   Subjective Assessment - 10/16/21 1025     Subjective  Pt reports that she is unable to wear splints despite trying multiple sizes (medium and large). Pt denies pain but reports that she is sore.    Patient Stated Goals Increased use of hands, increased strength    Currently in Pain? No/denies    Multiple Pain Sites No              OT Treatments/Exercises (OP) - 10/16/21 0001       ADLs   ADL Comments Pt reports that she has been unable to use neoprene hand based CMC/thumb spica splints in either size medium or large, stating that both are "too bulky, too tight or too loose and awkward to wear" "maybe I have to get used to them" Discussed  use of other prefab splint possibilities (such as over the counter thumb spica splints that are forearm based. Showed pt splints & she stated that she would not be able to wear these either as they immobilze her too much. Pt stated that she has gotten a paraffin unit for home use but has not used it yet. Issued information on possible hand warmers that she may use if desired and reviewed joint protection techniques. It was decided that pt would be open to a hand based custom thumb/CMC splint for her right hand at this time. All splinting use, care and precuations were reviewed and pt was noted to mod I don/doff in the clinic today. She will bring to her next appointment for adjustments PRN and a left splint can be fabricated if she is able to wear the right and finds it to be beneficial.      Exercises   Exercises Wrist;Hand      Wrist  Exercises   Other wrist exercises Verbal review only of A/ROM home program      Hand Exercises   Other Hand Exercises Verbal review of active ROM hand ex's      Splinting   Splinting Custom hand based thumb spica was fabricated for right hand. Pt was educated in splinting use, care and precautions. Splint places her right dominant thumb in the functional position of slight rotation and ABD. She was mod I with don/doffing. She will use during functional activity at home PRN for supprot to her thumb/CMC.              OT Short Term Goals - 10/16/21 1245       OT SHORT TERM GOAL #1   Title Pt will be Mod I splinting use, care and precautions bilateral hands/thumbs    Time 4    Period Weeks    Status New    Target Date 10/30/21      OT SHORT TERM GOAL #2   Title Pt will be Mod I initial home program and A/ROM for arthritis    Time 4    Period Weeks    Status New    Target Date 10/30/21               OT Long Term Goals - 10/16/21 1247       OT LONG TERM GOAL #1   Title Pt will be Mod I stating 2-3 items of a/e that may be used for ADL's and increased independence    Time 8    Period Weeks    Status New    Target Date 11/27/21      OT LONG TERM GOAL #2   Title Pt will report pain as 2/10 or less implementing joint protection techniques during ADL/homemaking tasks    Time 8    Period Weeks    Status New    Target Date 11/27/21      OT LONG TERM GOAL #3   Title Pt will be Mod I upgraded pain free HEP for bilateral UE's as observed in clinic    Time 8    Period Weeks    Status New    Target Date 11/27/21             Plan - 10/16/21 1239     Clinical Impression Statement Pt has had difficulty tolerating a few different kinds of splints. A custom hand based thumb spica splint was fabricated today that should allow for functional activity while providing support  to her right dominant hand/CMC. If she can tolerate this splint, another could be fabricated for  her left hand as well. Splinting use, care and precautions were reviewed in the clinic and she was Mod I with donning/doffing as seen in the clinic today. She will bring the splint to her next appointment for splint check and adjustments PRN.    OT Occupational Profile and History Problem Focused Assessment - Including review of records relating to presenting problem    Occupational performance deficits (Please refer to evaluation for details): ADL's    Body Structure / Function / Physical Skills ADL;Strength;Pain;Dexterity;Edema;UE functional use;ROM;Coordination;Flexibility;Decreased knowledge of precautions;FMC    Rehab Potential Fair    Clinical Decision Making Limited treatment options, no task modification necessary    Comorbidities Affecting Occupational Performance: None    Modification or Assistance to Complete Evaluation  No modification of tasks or assist necessary to complete eval    OT Frequency Other (comment)   1-2x/week   OT Duration 8 weeks    OT Treatment/Interventions Self-care/ADL training;Fluidtherapy;DME and/or AE instruction;Splinting;Therapeutic activities;Ultrasound;Therapeutic exercise;Paraffin;Manual Therapy;Patient/family education    Plan Splint check and adjustment R hand based thumb spica, consider L thumb spica if pt can tolerate right, review joint protection techniques/a/e based on pt difficulties at home for ADL/homemaking tasks, review general pain free A/ROM HEP.    Consulted and Agree with Plan of Care Patient             Patient will benefit from skilled therapeutic intervention in order to improve the following deficits and impairments:   Body Structure / Function / Physical Skills: ADL, Strength, Pain, Dexterity, Edema, UE functional use, ROM, Coordination, Flexibility, Decreased knowledge of precautions, [facility]       Visit Diagnosis: Pain in joint of left hand  Pain in joint of right hand  Localized edema  Muscle weakness  (generalized)  Stiffness of left hand, not elsewhere classified  Stiffness of right hand, not elsewhere classified  Other lack of coordination    Problem List Patient Active Problem List   Diagnosis Date Noted   Osteoarthritis of carpometacarpal (CMC) joints of both thumbs 09/26/2021   Pharyngoesophageal dysphagia 04/09/2021   Sinus pressure 04/09/2021   Pain due to onychomycosis of toenails of both feet 12/25/2020   History of cerebellar stroke 10/05/2020   Memory loss 10/05/2020   Arthritis of both knees 09/10/2020   Osteoarthritis of both hands 08/29/2020   Bilateral hand pain 08/29/2020   Bilateral carpal tunnel syndrome 08/29/2020   Trigger thumb of left hand 08/29/2020   History of adenomatous polyp of colon 12/30/2019   Mild hyperlipidemia 06/01/2019   Degenerative arthritis of knee, bilateral 04/08/2019   Chronic idiopathic constipation 03/01/2019   Chronic cough 01/21/2018   Genetic testing 07/09/2017   history of Breast cancer of lower-outer quadrant of right female breast  01/16/2016   Palpitations 11/13/2014   Normal coronary arteries 10/17/2014   Varicose veins of lower extremities with other complications 09/32/3557   Hx of herpes simplex infection 12/18/2011   Low back pain 06/28/2010   Lower abdominal pain 06/28/2010   Generalized anxiety disorder 10/13/2008   Irritable bowel syndrome 10/13/2008   Lung nodule 11/25/2007   Raynaud's syndrome 07/01/2007   Aneurysm of thoracic aorta 05/06/2007   Allergic rhinitis 05/06/2007   Esophageal reflux 05/06/2007   DEGENERATIVE DISC DISEASE, CERVICAL SPINE 05/06/2007   Osteoporosis 03/18/2007   History of ovarian cancer 09/22/1977    Almyra Deforest, OT 10/16/2021, 12:49 PM  [facility] [address] [place], [place], [address] Phone: [phone]   Fax:  [phone]  Name: Cindy Schwartz MRN: [id number] Date of Birth: 15-Jul-1934

## 2021-10-24 NOTE — Telephone Encounter (Signed)
Called patient to advise no answer left voicemail.

## 2021-10-28 ENCOUNTER — Encounter: Payer: Self-pay | Admitting: Gastroenterology

## 2021-11-04 ENCOUNTER — Other Ambulatory Visit: Payer: Self-pay

## 2021-11-04 ENCOUNTER — Ambulatory Visit (INDEPENDENT_AMBULATORY_CARE_PROVIDER_SITE_OTHER): Payer: Medicare Other | Admitting: Podiatry

## 2021-11-04 DIAGNOSIS — M79674 Pain in right toe(s): Secondary | ICD-10-CM | POA: Diagnosis not present

## 2021-11-04 DIAGNOSIS — M79675 Pain in left toe(s): Secondary | ICD-10-CM | POA: Diagnosis not present

## 2021-11-04 DIAGNOSIS — B351 Tinea unguium: Secondary | ICD-10-CM | POA: Diagnosis not present

## 2021-11-04 DIAGNOSIS — M79672 Pain in left foot: Secondary | ICD-10-CM | POA: Diagnosis not present

## 2021-11-04 DIAGNOSIS — M79671 Pain in right foot: Secondary | ICD-10-CM | POA: Diagnosis not present

## 2021-11-04 DIAGNOSIS — D224 Melanocytic nevi of scalp and neck: Secondary | ICD-10-CM | POA: Insufficient documentation

## 2021-11-04 DIAGNOSIS — Q828 Other specified congenital malformations of skin: Secondary | ICD-10-CM | POA: Diagnosis not present

## 2021-11-04 DIAGNOSIS — Z87898 Personal history of other specified conditions: Secondary | ICD-10-CM | POA: Insufficient documentation

## 2021-11-04 DIAGNOSIS — D2221 Melanocytic nevi of right ear and external auricular canal: Secondary | ICD-10-CM | POA: Insufficient documentation

## 2021-11-05 ENCOUNTER — Ambulatory Visit (INDEPENDENT_AMBULATORY_CARE_PROVIDER_SITE_OTHER): Payer: Medicare Other | Admitting: Orthopedic Surgery

## 2021-11-05 ENCOUNTER — Encounter: Payer: Self-pay | Admitting: Orthopedic Surgery

## 2021-11-05 VITALS — BP 131/83 | HR 74

## 2021-11-05 DIAGNOSIS — M19042 Primary osteoarthritis, left hand: Secondary | ICD-10-CM | POA: Diagnosis not present

## 2021-11-05 DIAGNOSIS — M19041 Primary osteoarthritis, right hand: Secondary | ICD-10-CM

## 2021-11-05 MED ORDER — LIDOCAINE HCL 1 % IJ SOLN
0.5000 mL | INTRAMUSCULAR | Status: AC | PRN
  Administered 2021-11-05: .5 mL

## 2021-11-05 MED ORDER — BETAMETHASONE SOD PHOS & ACET 6 (3-3) MG/ML IJ SUSP
3.0000 mg | INTRAMUSCULAR | Status: AC | PRN
  Administered 2021-11-05: 3 mg via INTRA_ARTICULAR

## 2021-11-05 NOTE — Progress Notes (Signed)
Office Visit Note   Patient: Cindy Schwartz           Date of Birth: [birth year]           MRN: [id number] Visit Date: 11/05/2021              Requested by: Marin Olp, MD [place],  [place] [address] PCP: Marin Olp, MD   Assessment & Plan: Visit Diagnoses: No diagnosis found.  Plan: Patient has bilateral thumb CMC osteoarthritis with the right more affected than her left.  We have tried occupational therapy, bracing, and topical NSAIDs.  She wants to continue to try conservative management as she is not interested in surgery.  We will try corticosteroid junction into the right thumb CMC joint to see if this provides some relief.  I can see her back in around 8 weeks if she is still symptomatic.   Follow-Up Instructions: No follow-ups on file.   Orders:  No orders of the defined types were placed in this encounter.  No orders of the defined types were placed in this encounter.     Procedures: Hand/UE Inj: R thumb CMC for osteoarthritis on 11/05/2021 2:19 PM Indications: pain Details: 25 G needle, radial approach Medications: 0.5 mL lidocaine 1 %; 3 mg betamethasone acetate-betamethasone sodium phosphate 6 (3-3) MG/ML Outcome: tolerated well, no immediate complications Procedure, treatment alternatives, risks and benefits explained, specific risks discussed. Consent was given by the patient. Immediately prior to procedure a time out was called to verify the correct patient, procedure, equipment, support staff and site/side marked as required. Patient was prepped and draped in the usual sterile fashion.      Clinical Data: No additional findings.   Subjective: Chief Complaint  Patient presents with   Right Hand - Follow-up   Left Hand - Follow-up    This an 88 year old right-hand-dominant female who presents with bilateral thumb basilar joint pain.  Her right is more affected than the left.  This been going on since around December or so.  We  have tried conservative management so far with hand therapy, bracing, and topical NSAIDs.  She still describes pain at the base of her thumb and at the thumb MP joint.   Review of Systems   Objective: Vital Signs: BP 131/83 (BP Location: Left Arm, Patient Position: Sitting, Cuff Size: Normal)    Pulse 74    LMP  (LMP Unknown)    SpO2 98%   Physical Exam  Right Hand Exam   Tenderness  Right hand tenderness location: TTP at thumb CMC joint.  Other  Erythema: absent Sensation: normal Pulse: present  Comments:  + CMC grind test w/ pain but without overt crepitus.  No static or dynamic MP hyper-extension.    Left Hand Exam   Tenderness  Left hand tenderness location: TTP at thumb CMC joint.   Other  Erythema: absent Sensation: normal Pulse: present  Comments:  + CMC grind test w/ pain but without overt crepitus.  No static or dynamic MP hyper-extension.      Specialty Comments:  No specialty comments available.  Imaging: No results found.   PMFS History: Patient Active Problem List   Diagnosis Date Noted   Melanocytic nevi of right ear and external auricular canal 11/04/2021   History of neoplasm 11/04/2021   Melanocytic nevi of scalp and neck 11/04/2021   Osteoarthritis of carpometacarpal (CMC) joints of both thumbs 09/26/2021   Pharyngoesophageal dysphagia 04/09/2021   Sinus pressure 04/09/2021  Pain due to onychomycosis of toenails of both feet 12/25/2020   History of cerebellar stroke 10/05/2020   Memory loss 10/05/2020   Arthritis of both knees 09/10/2020   Osteoarthritis of both hands 08/29/2020   Bilateral hand pain 08/29/2020   Bilateral carpal tunnel syndrome 08/29/2020   Trigger thumb of left hand 08/29/2020   History of adenomatous polyp of colon 12/30/2019   Mild hyperlipidemia 06/01/2019   Degenerative arthritis of knee, bilateral 04/08/2019   Chronic idiopathic constipation 03/01/2019   Chronic cough 01/21/2018   Genetic testing 07/09/2017    history of Breast cancer of lower-outer quadrant of right female breast  01/16/2016   Palpitations 11/13/2014   Normal coronary arteries 10/17/2014   Varicose veins of lower extremities with other complications 16/06/9603   Hx of herpes simplex infection 12/18/2011   Low back pain 06/28/2010   Lower abdominal pain 06/28/2010   Generalized anxiety disorder 10/13/2008   Irritable bowel syndrome 10/13/2008   Lung nodule 11/25/2007   Raynaud's syndrome 07/01/2007   Aneurysm of thoracic aorta 05/06/2007   Allergic rhinitis 05/06/2007   Esophageal reflux 05/06/2007   DEGENERATIVE [place] DISEASE, CERVICAL SPINE 05/06/2007   Osteoporosis 03/18/2007   History of ovarian cancer 09/22/1977   Past Medical History:  Diagnosis Date   Aneurysm of ascending aorta    a. 4.2-4.3cm in 2014.   Anxiety    Arthritis    Breast cancer ([place]) 2018   right mastectomy   Cancer ([place]) 1980   ovarian cancer   Compression fracture of L1 lumbar vertebra ([place])    COMPRESSION FRACTURE, THORACIC VERTEBRA 07/24/2010   Qualifier: Diagnosis of  By: [name], [name]    Elevated BP Transient 09/2014   Genetic testing 07/09/2017   Multi-Cancer panel (83 genes) @ [facility] - No pathogenic mutations detected   GERD (gastroesophageal reflux disease)    H/O bone density study    2015   H/O colonoscopy    History of ovarian cancer    November 1979   History of ovarian cancer 1979   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   Insomnia    Irregular heart beat    Normal coronary arteries Cath - 09/2014   Osteoporosis    Raynaud's disease     Family History  Problem Relation Age of Onset   Hip fracture Mother    Other Father        lived to 76   Healthy Brother    Colon polyps Brother    Breast cancer Cousin 18       mat first cousin related through uncle   Esophageal cancer Cousin 51       mat first cousin related through uncle    Past Surgical History:  Procedure Laterality Date    ABDOMINAL HYSTERECTOMY  1979   APPENDECTOMY     BILATERAL SALPINGOOPHORECTOMY     BREAST LUMPECTOMY     COLONOSCOPY  02/2012   LEFT HEART CATHETERIZATION WITH CORONARY ANGIOGRAM N/A 10/17/2014   Procedure: LEFT HEART CATHETERIZATION WITH CORONARY ANGIOGRAM;  Surgeon: [name], MD;  Location: [facility] CATH LAB;  Service: Cardiovascular;  Laterality: N/A;   ovarian ca s/p taj/bso  1979   SIMPLE MASTECTOMY WITH AXILLARY SENTINEL NODE BIOPSY Right 05/05/2017   Procedure: RIGHT TOTAL MASTECTOMY WITH RIGHT  AXILLARY SENTINEL NODE BIOPSY;  Surgeon: [name], MD;  Location: [place];  Service: General;  Laterality: Right;  PECTORAL BLOCK   Social History   Occupational History   Occupation:  retired    Fish farm manager: RETIRED  Tobacco Use   Smoking status: Never   Smokeless tobacco: Never  Vaping Use   Vaping Use: Never used  Substance and Sexual Activity   Alcohol use: No   Drug use: No   Sexual activity: Not on file

## 2021-11-10 ENCOUNTER — Encounter: Payer: Self-pay | Admitting: Podiatry

## 2021-11-10 NOTE — Progress Notes (Signed)
°  Subjective:  Patient ID: Cindy Schwartz, female    DOB: February 28, 1934,  MRN: [id number]  Cindy Schwartz presents to clinic today for painful porokeratotic lesion(s) submet head 2 bilaterally and painful mycotic toenails that limit ambulation. Painful toenails interfere with ambulation. Aggravating factors include wearing enclosed shoe gear. Pain is relieved with periodic professional debridement. Painful porokeratotic lesions are aggravated when weightbearing with and without shoegear. Pain is relieved with periodic professional debridement.  New problem(s): None.   PCP is Marin Olp, MD , and last visit was June 26, 2021.  Allergies  Allergen Reactions   Chlorhexidine Gluconate Rash    Review of Systems: Negative except as noted in the HPI. Objective:   Constitutional Cindy Schwartz is a pleasant 88 y.o. Caucasian female, WD, WN in NAD. AAO x 3.   Vascular CFT <3 seconds b/l LE. Palpable pedal pulses b/l LE. Pedal hair sparse. No pain with calf compression b/l. Lower extremity skin temperature gradient within normal limits. No edema noted b/l LE. Varicosities present b/l. No cyanosis or clubbing noted b/l LE.  Neurologic Normal speech. Oriented to person, place, and time. Protective sensation intact 5/5 intact bilaterally with 10g monofilament b/l. Vibratory sensation intact b/l.  Dermatologic Pedal skin thin, shiny and atrophic b/l LE. No open wounds b/l LE. No interdigital macerations noted b/l LE. Toenails 1-5 bilaterally elongated, discolored, dystrophic, thickened, and crumbly with subungual debris and tenderness to dorsal palpation. Porokeratotic lesion(s) submet head 2 b/l. No erythema, no edema, no drainage, no fluctuance.  Orthopedic: Muscle strength 5/5 to all lower extremity muscle groups bilaterally. No pain, crepitus or joint limitation noted with ROM bilateral LE. Plantarflexed metatarsal(s) 2nd b/l feet.   Radiographs: None   Assessment:   1. Pain due to onychomycosis of  toenails of both feet   2. Porokeratosis   3. Pain in both feet    Plan:  Patient was evaluated and treated and all questions answered. Consent given for treatment as described below: -Mycotic toenails 1-5 bilaterally were debrided in length and girth with sterile nail nippers and dremel without incident. -Painful porokeratotic lesion(s) submet head 2 b/l pared and enucleated with sterile scalpel blade. Total number of lesions debrided=2. -Iatrogenic laceration sustained during submet head 2 right foot.  Treated with Lumicain Hemostatic Solution and alcohol. Patient instructed to apply Neosporin to right foot once daily for 7 days. -Patient/POA to call should there be question/concern in the interim.  Return in about 3 months (around 02/01/2022).  Marzetta Board, DPM

## 2021-11-29 NOTE — Progress Notes (Incomplete)
Phone [phone] In person visit   Subjective:   Cindy Schwartz is a 88 y.o. year old very pleasant female patient who presents for/with See problem oriented charting No chief complaint on file.   This visit occurred during the SARS-CoV-2 public health emergency.  Safety protocols were in place, including screening questions prior to the visit, additional usage of staff PPE, and extensive cleaning of exam room while observing appropriate contact time as indicated for disinfecting solutions.   Past Medical History-  Patient Active Problem List   Diagnosis Date Noted   Melanocytic nevi of right ear and external auricular canal 11/04/2021   History of neoplasm 11/04/2021   Melanocytic nevi of scalp and neck 11/04/2021   Osteoarthritis of carpometacarpal (CMC) joints of both thumbs 09/26/2021   Pharyngoesophageal dysphagia 04/09/2021   Sinus pressure 04/09/2021   Pain due to onychomycosis of toenails of both feet 12/25/2020   History of cerebellar stroke 10/05/2020   Memory loss 10/05/2020   Arthritis of both knees 09/10/2020   Osteoarthritis of both hands 08/29/2020   Bilateral hand pain 08/29/2020   Bilateral carpal tunnel syndrome 08/29/2020   Trigger thumb of left hand 08/29/2020   History of adenomatous polyp of colon 12/30/2019   Mild hyperlipidemia 06/01/2019   Degenerative arthritis of knee, bilateral 04/08/2019   Chronic idiopathic constipation 03/01/2019   Chronic cough 01/21/2018   Genetic testing 07/09/2017   history of Breast cancer of lower-outer quadrant of right female breast  01/16/2016   Palpitations 11/13/2014   Normal coronary arteries 10/17/2014   Varicose veins of lower extremities with other complications 58/85/0277   Hx of herpes simplex infection 12/18/2011   Low back pain 06/28/2010   Lower abdominal pain 06/28/2010   Generalized anxiety disorder 10/13/2008   Irritable bowel syndrome 10/13/2008   Lung nodule 11/25/2007   Raynaud's syndrome  07/01/2007   Aneurysm of thoracic aorta 05/06/2007   Allergic rhinitis 05/06/2007   Esophageal reflux 05/06/2007   DEGENERATIVE DISC DISEASE, CERVICAL SPINE 05/06/2007   Osteoporosis 03/18/2007   History of ovarian cancer 09/22/1977    Medications- reviewed and updated Current Outpatient Medications  Medication Sig Dispense Refill   Ascorbic Acid (VITAMIN C) 1000 MG tablet Take 1,000 mg by mouth daily.     aspirin EC 81 MG tablet Take 81 mg by mouth as needed. Swallow whole. As needed     Cholecalciferol (VITAMIN D3) 1000 units CAPS Take 2,000 Units by mouth daily.     esomeprazole (NEXIUM) 20 MG capsule Take 20 mg by mouth daily at 12 noon.     fluticasone (FLONASE) 50 MCG/ACT nasal spray SHAKE LIQUID AND USE 2 SPRAYS IN EACH NOSTRIL DAILY 48 g 2   hydrocortisone (ANUSOL-HC) 2.5 % rectal cream Apply topically 2 (two) times daily.     MAGNESIUM PO Take 300 mg by mouth.     rosuvastatin (CRESTOR) 5 MG tablet Take 1 tablet (5 mg total) by mouth daily. Take 1 tablet daily by mouth. 90 tablet 3   valACYclovir (VALTREX) 500 MG tablet Take 2 tablets (1,000 mg total) by mouth as needed. 90 tablet 3   No current facility-administered medications for this visit.     Objective:  LMP  (LMP Unknown)  Gen: NAD, resting comfortably CV: RRR no murmurs rubs or gallops Lungs: CTAB no crackles, wheeze, rhonchi Abdomen: soft/nontender/nondistended/normal bowel sounds. No rebound or guarding.  Ext: no edema Skin: warm, dry Neuro: grossly normal, moves all extremities  ***    Assessment and Plan   ***  12/20/21awv.  ***06/11/2020 cpe ***mail copy to house of labs I believe  # social update- lost husband august 2021  #History of cerebellar stroke with some balance and memory issues/hyperlipidemia S: Patient complained of issues with memory loss-on MRI of brain 09/17/2020-"Remote right cerebellar infarct." We started aspirin 81 mg as well as rosuvastatin 5 mg daily with LDL goal less than  70  Memory and balance issues could be associated with prior stroke Lab Results  Component Value Date   CHOL 146 06/26/2021   HDL 67.60 06/26/2021   LDLCALC 69 06/26/2021   LDLDIRECT 56.0 12/11/2020   TRIG 48.0 06/26/2021   CHOLHDL 2 06/26/2021   A/P: ***  #- Aneurysm of thoracic aorta S: Gets MRI/MRI with Dr. [name] every other year-scheduled in March for this. Blood pressure goal less than 130/80 -Patient with normal coronary arteries in the past but still follows with Dr. Johnsie Cancel every 6 months- palpitations with PVCs/PACs in the past A/P: ***   #Esophageal reflux/history of chronic cough- has seen Dr. Chase Caller in the past S: Compliant with Nexium 20 mg  every day. A/P: ***   #Osteoporosis S: Patient on calcium and vitamin D. Took Fosamax for years. 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 DEXA October 2018 showed worst T score at -3.0 lumbar spine. On different machine in 07/2019 worst t score - 2.2 RFN and LFN -2020 declined Prolia -Nexium not ideal for osteoporosis***  A/P: ***   #Generalized anxiety disorder S: Anxiety has been controlled without medication recently. 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 No radiation or chemotherapy. Followed with Dr. [name]    #raynauds syndrome for years- slightly worsened 2022  Health Maintenance Due  Topic Date Due   Zoster Vaccines- Shingrix (1 of 2) Never done   Pneumonia Vaccine 60+ Years old (2 - PCV) 11/05/2019   Recommended follow up: No follow-ups on file. Future Appointments  Date Time Provider [place]  12/03/2021  8:50 AM Ladene Artist, MD [facility]  01/03/2022 10:30 AM Sherilyn Cooter, MD [facility] None  01/08/2022  8:00 AM Marin Olp, MD [facility]  02/04/2022  10:30 AM Marzetta Board, DPM [facility]  09/30/2022  8:00 AM [facility] HEALTH COACH [facility]    Lab/Order associations: No diagnosis found.  No orders of the defined types were placed in this encounter.   Return precautions advised.  Burnett Corrente

## 2021-12-03 ENCOUNTER — Ambulatory Visit (INDEPENDENT_AMBULATORY_CARE_PROVIDER_SITE_OTHER): Payer: Medicare Other | Admitting: Gastroenterology

## 2021-12-03 ENCOUNTER — Other Ambulatory Visit: Payer: Self-pay | Admitting: Gastroenterology

## 2021-12-03 ENCOUNTER — Encounter: Payer: Self-pay | Admitting: Gastroenterology

## 2021-12-03 VITALS — BP 126/66 | HR 76 | Ht 65.0 in | Wt 142.8 lb

## 2021-12-03 DIAGNOSIS — Z86010 Personal history of colonic polyps: Secondary | ICD-10-CM | POA: Diagnosis not present

## 2021-12-03 DIAGNOSIS — K219 Gastro-esophageal reflux disease without esophagitis: Secondary | ICD-10-CM

## 2021-12-03 DIAGNOSIS — K5904 Chronic idiopathic constipation: Secondary | ICD-10-CM | POA: Diagnosis not present

## 2021-12-03 MED ORDER — FAMOTIDINE 40 MG PO TABS: 40.0000 mg | ORAL_TABLET | Freq: Two times a day (BID) | ORAL | 0 refills | Status: DC

## 2021-12-03 NOTE — Patient Instructions (Signed)
Stop taking Nexium.  ? ?We have sent the following medications to your pharmacy for you to pick up at your convenience: famotidine 40 mg twice daily for one month. Then we will send a 90 day supply to your mail order pharmacy if this medication helps. ? ?The [facility] providers would like to encourage you to use [facility] to communicate with providers for non-urgent requests or questions.  Due to long hold times on the telephone, sending your provider a message by [facility] may be a faster and more efficient way to get a response.  Please allow 48 business hours for a response.  Please remember that this is for non-urgent requests.  ? ?Thank you for choosing me and [facility]. ? ?Malcolm T. Dagoberto Ligas., MD., Marval Regal ? ? ? ? ? ?

## 2021-12-03 NOTE — Progress Notes (Signed)
? ? ?History of Present Illness: This is an 88 year old female referred by Marin Olp, MD for the evaluation of chronic constipation, GERD.  She was previously followed by Dr. Ronald Lobo and has undergone extensive evaluation.  I have reviewed the referral records.  Dr. Cristina Gong had recommended a combination of Amitiza and MiraLAX which was effective.  She states she recently changed to a product called [facility] to help with constipation.  She discontinued Amitiza and MiraLAX.  She is having 2 bowel movements a day without abdominal pain.  She relates postprandial fullness which has been present for several years.  Occasional difficulty swallowing certain foods attributed to esophageal dysmotility.  Her reflux symptoms are fairly well controlled on Nexium 20 mg daily.  She takes Tums as needed for breakthrough symptoms.  She is concerned about potential effects of Nexium and other PPIs on osteoporosis. Denies weight loss, abdominal pain, diarrhea, change in stool caliber, melena, hematochezia, nausea, vomiting,  chest pain. ? ? ? ?Allergies  ?Allergen Reactions  ? Chlorhexidine Gluconate Rash  ? ?Outpatient Medications Prior to Visit  ?Medication Sig Dispense Refill  ? Ascorbic Acid (VITAMIN C) 1000 MG tablet Take 1,000 mg by mouth daily.    ? aspirin EC 81 MG tablet Take 81 mg by mouth as needed. Swallow whole. As needed    ? Cholecalciferol (VITAMIN D3) 1000 units CAPS Take 2,000 Units by mouth daily.    ? COLLAGEN PO Take by mouth.    ? esomeprazole (NEXIUM) 20 MG capsule Take 20 mg by mouth daily at 12 noon.    ? fluticasone (FLONASE) 50 MCG/ACT nasal spray SHAKE LIQUID AND USE 2 SPRAYS IN EACH NOSTRIL DAILY 48 g 2  ? hydrocortisone (ANUSOL-HC) 2.5 % rectal cream Apply topically 2 (two) times daily.    ? MAGNESIUM PO Take 300 mg by mouth.    ? rosuvastatin (CRESTOR) 5 MG tablet Take 1 tablet (5 mg total) by mouth daily. Take 1 tablet daily by mouth. 90 tablet 3  ? valACYclovir (VALTREX) 500  MG tablet Take 2 tablets (1,000 mg total) by mouth as needed. 90 tablet 3  ? ?No facility-administered medications prior to visit.  ? ?Past Medical History:  ?Diagnosis Date  ? Aneurysm of ascending aorta   ? a. 4.2-4.3cm in 2014.  ? Anxiety   ? Arthritis   ? Breast cancer ([place]) 2018  ? right mastectomy  ? Cancer [facility]) 1980  ? ovarian cancer  ? Compression fracture of L1 lumbar vertebra ([facility])   ? COMPRESSION FRACTURE, THORACIC VERTEBRA 07/24/2010  ? Qualifier: Diagnosis of  By: [name], Balinda Quails   ? Elevated BP Transient 09/2014  ? Genetic testing 07/09/2017  ? Multi-Cancer panel (83 genes) @ [facility] - No pathogenic mutations detected  ? GERD (gastroesophageal reflux disease)   ? H/O bone density study   ? 2015  ? H/O colonoscopy   ? History of ovarian cancer   ? November 1979  ? History of ovarian cancer 1979  ? 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  ? Insomnia   ? Irregular heart beat   ? Normal coronary arteries Cath - 09/2014  ? Osteoporosis   ? Raynaud's disease   ? ?Past Surgical History:  ?Procedure Laterality Date  ? ABDOMINAL HYSTERECTOMY  1979  ? APPENDECTOMY    ? BILATERAL SALPINGOOPHORECTOMY    ? BREAST LUMPECTOMY    ? COLONOSCOPY  02/2012  ? LEFT HEART CATHETERIZATION WITH CORONARY ANGIOGRAM N/A 10/17/2014  ?  Procedure: LEFT HEART CATHETERIZATION WITH CORONARY ANGIOGRAM;  Surgeon: [name], MD;  Location: [facility] CATH LAB;  Service: Cardiovascular;  Laterality: N/A;  ? ovarian ca s/p taj/bso  1979  ? SIMPLE MASTECTOMY WITH AXILLARY SENTINEL NODE BIOPSY Right 05/05/2017  ? Procedure: RIGHT TOTAL MASTECTOMY WITH RIGHT  AXILLARY SENTINEL NODE BIOPSY;  Surgeon: [name], MD;  Location: [facility];  Service: General;  Laterality: Right;  PECTORAL BLOCK  ? ?Social History  ? ?Socioeconomic History  ? Marital status: Married  ?  Spouse name: Sheliah Plane  ? Number of children: 2  ? Years of education: Not on file  ? Highest education level: Not on file  ?Occupational History  ? Occupation:  retired  ?  Employer: RETIRED  ?Tobacco Use  ? Smoking status: Never  ? Smokeless tobacco: Never  ?Vaping Use  ? Vaping Use: Never used  ?Substance and Sexual Activity  ? Alcohol use: No  ? Drug use: No  ? Sexual activity: Not on file  ?Other Topics Concern  ? Not on file  ?Social History Narrative  ? Widowed aug 2021. She and her husband  from the [place] of [place]. Children: Lassie Demorest, lives in [place], [place]  ? [employer], lives in [place], [place]. 4 grandkids. No greatgrandkids.   ?   ? 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   ? Hobbies: church and friends at [employer].   ? ?Social Determinants of Health  ? ?Financial Resource Strain: Low Risk   ? Difficulty of Paying Living Expenses: Not hard at all  ?Food Insecurity: No Food Insecurity  ? Worried About Charity fundraiser in the Last Year: Never true  ? Ran Out of Food in the Last Year: Never true  ?Transportation Needs: No Transportation Needs  ? Lack of Transportation (Medical): No  ? Lack of Transportation (Non-Medical): No  ?Physical Activity: Inactive  ? Days of Exercise per Week: 0 days  ? Minutes of Exercise per Session: 0 min  ?Stress: No Stress Concern Present  ? Feeling of Stress : Only a little  ?Social Connections: Moderately Isolated  ? Frequency of Communication with Friends and Family: More than three times a week  ? Frequency of Social Gatherings with Friends and Family: More than three times a week  ? Attends Religious Services: More than 4 times per year  ? Active Member of Clubs or Organizations: No  ? Attends Archivist Meetings: Never  ? Marital Status: Widowed  ? ?Family History  ?Problem Relation Age of Onset  ? Hip fracture Mother   ? Other Father   ?     lived to 43  ? Healthy Brother   ? Colon polyps Brother   ? Breast cancer Cousin 54  ?     mat first cousin related through uncle  ? Esophageal cancer Cousin 28  ?     mat first cousin related through uncle  ? ?   ? ?Review of  Systems: Pertinent positive and negative review of systems were noted in the above HPI section. All other review of systems were otherwise negative. ? ? ? ?Physical Exam: ?General: Well developed, well nourished, no acute distress ?Head: Normocephalic and atraumatic ?Eyes: Sclerae anicteric, EOMI ?Ears: Normal auditory acuity ?Mouth: Not examined, mask on during Covid-19 pandemic ?Neck: Supple, no masses or thyromegaly ?Lungs: Clear throughout to auscultation ?Heart: Regular rate and rhythm; no murmurs, rubs or bruits ?Abdomen: Soft, non tender  and non distended. No masses, hepatosplenomegaly or hernias noted. Normal Bowel sounds ?Rectal: Not done ?Musculoskeletal: Symmetrical with no gross deformities  ?Skin: No lesions on visible extremities ?Pulses:  Normal pulses noted ?Extremities: No clubbing, cyanosis, edema or deformities noted ?Neurological: Alert oriented x 4, grossly nonfocal ?Cervical Nodes:  No significant cervical adenopathy ?Inguinal Nodes: No significant inguinal adenopathy ?Psychological:  Alert and cooperative. Normal mood and affect ? ? ?Assessment and Recommendations: ? ?Chronic idiopathic constipation.  Patient would like to remain on [facility] 1 teaspoon mixed in water twice daily as this has been effective. ?Sigmoid diverticulosis. ?History of small tubular adenomas polyps and 1 sessile serrated polyp in March 2021. No future surveillance colonoscopies recommended due to age.  ?Esophageal dysmotility.  Avoid problematic foods and beverages. EGD performed in March 2021 however records are not available at this time. ?GERD. Reactive gastropathy.  Follow antireflux measures.  Trial of famotidine 40 mg p.o. twice daily instead of Nexium.  If symptoms are not well controlled will resume Nexium 20 mg daily. REV in 1 year. ? ? ?cc: Marin Olp, MD ?[place],  [place] [address] ?

## 2021-12-31 ENCOUNTER — Other Ambulatory Visit: Payer: Self-pay | Admitting: General Surgery

## 2021-12-31 DIAGNOSIS — Z1231 Encounter for screening mammogram for malignant neoplasm of breast: Secondary | ICD-10-CM

## 2022-01-03 ENCOUNTER — Ambulatory Visit: Payer: Medicare Other | Admitting: Orthopedic Surgery

## 2022-01-08 ENCOUNTER — Ambulatory Visit (INDEPENDENT_AMBULATORY_CARE_PROVIDER_SITE_OTHER)
Admission: RE | Admit: 2022-01-08 | Discharge: 2022-01-08 | Disposition: A | Discharge status: Home / Self Care | Payer: Medicare Other | Source: Ambulatory Visit | Attending: Family Medicine | Admitting: Family Medicine

## 2022-01-08 ENCOUNTER — Encounter: Payer: Self-pay | Admitting: Family Medicine

## 2022-01-08 ENCOUNTER — Ambulatory Visit (INDEPENDENT_AMBULATORY_CARE_PROVIDER_SITE_OTHER): Payer: Medicare Other | Admitting: Family Medicine

## 2022-01-08 VITALS — BP 130/78 | HR 70 | Temp 97.2°F | Ht 65.0 in | Wt 141.0 lb

## 2022-01-08 DIAGNOSIS — G8929 Other chronic pain: Secondary | ICD-10-CM

## 2022-01-08 DIAGNOSIS — I712 Thoracic aortic aneurysm, without rupture, unspecified: Secondary | ICD-10-CM | POA: Diagnosis not present

## 2022-01-08 DIAGNOSIS — K219 Gastro-esophageal reflux disease without esophagitis: Secondary | ICD-10-CM

## 2022-01-08 DIAGNOSIS — M818 Other osteoporosis without current pathological fracture: Secondary | ICD-10-CM

## 2022-01-08 DIAGNOSIS — R053 Chronic cough: Secondary | ICD-10-CM

## 2022-01-08 DIAGNOSIS — E785 Hyperlipidemia, unspecified: Secondary | ICD-10-CM

## 2022-01-08 DIAGNOSIS — M5441 Lumbago with sciatica, right side: Secondary | ICD-10-CM

## 2022-01-08 DIAGNOSIS — Z8673 Personal history of transient ischemic attack (TIA), and cerebral infarction without residual deficits: Secondary | ICD-10-CM

## 2022-01-08 DIAGNOSIS — F411 Generalized anxiety disorder: Secondary | ICD-10-CM

## 2022-01-08 LAB — CBC WITH DIFFERENTIAL/PLATELET
Basophils Absolute: 0 10*3/uL (ref 0.0–0.1)
Basophils Relative: 0.7 % (ref 0.0–3.0)
Eosinophils Absolute: 0.3 10*3/uL (ref 0.0–0.7)
Eosinophils Relative: 5.5 % — ABNORMAL HIGH (ref 0.0–5.0)
HCT: 38.9 % (ref 36.0–46.0)
Hemoglobin: 12.9 g/dL (ref 12.0–15.0)
Lymphocytes Relative: 32.5 % (ref 12.0–46.0)
Lymphs Abs: 1.5 10*3/uL (ref 0.7–4.0)
MCHC: 33.2 g/dL (ref 30.0–36.0)
MCV: 84 fl (ref 78.0–100.0)
Monocytes Absolute: 0.5 10*3/uL (ref 0.1–1.0)
Monocytes Relative: 11.2 % (ref 3.0–12.0)
Neutro Abs: 2.4 10*3/uL (ref 1.4–7.7)
Neutrophils Relative %: 50.1 % (ref 43.0–77.0)
Platelets: 225 10*3/uL (ref 150.0–400.0)
RBC: 4.63 Mil/uL (ref 3.87–5.11)
RDW: 14.7 % (ref 11.5–15.5)
WBC: 4.8 10*3/uL (ref 4.0–10.5)

## 2022-01-08 LAB — COMPREHENSIVE METABOLIC PANEL
ALT: 12 U/L (ref 0–35)
AST: 16 U/L (ref 0–37)
Albumin: 4.2 g/dL (ref 3.5–5.2)
Alkaline Phosphatase: 76 U/L (ref 39–117)
BUN: 12 mg/dL (ref 6–23)
CO2: 30 mEq/L (ref 19–32)
Calcium: 9.5 mg/dL (ref 8.4–10.5)
Chloride: 101 mEq/L (ref 96–112)
Creatinine, Ser: 0.57 mg/dL (ref 0.40–1.20)
GFR: 82.49 mL/min (ref >60.00)
Glucose, Bld: 87 mg/dL (ref 70–99)
Potassium: 4.4 mEq/L (ref 3.5–5.1)
Sodium: 136 mEq/L (ref 135–145)
Total Bilirubin: 0.6 mg/dL (ref 0.2–1.2)
Total Protein: 7.1 g/dL (ref 6.0–8.3)

## 2022-01-08 LAB — POC URINALSYSI DIPSTICK (AUTOMATED)
Bilirubin, UA: NEGATIVE
Blood, UA: NEGATIVE
Glucose, UA: NEGATIVE
Ketones, UA: NEGATIVE
Nitrite, UA: NEGATIVE
Protein, UA: NEGATIVE
Spec Grav, UA: 1.025 (ref 1.010–1.025)
Urobilinogen, UA: 0.2 E.U./dL
pH, UA: 6 (ref 5.0–8.0)

## 2022-01-08 LAB — LDL CHOLESTEROL, DIRECT: Direct LDL: 66 mg/dL

## 2022-01-08 NOTE — Patient Instructions (Addendum)
Please check with your pharmacy to see if they have the shingrix vaccine. If they do- please get this immunization and update [place] by phone call or mychart with dates you receive the vaccine ?- also eligible for prevnar 20 ? ?We will call you about physical therapy- I want your first session to be after x-ray results so ideally get x-ray today. Call us if you do not hear within a week ? ?Please go to [facility]  central X-ray (updated 11/17/2019) ?- located [address] across the street from [place] ?- in the basement ?- Hours: 8:30-5:00 PM M-F (with lunch from 12:30- 1 PM). You do NOT need an appointment.  ? ?No other changes today- I did reach out to Dr. Fuller Plan about possible breath test for SIBO ? ?Recommended follow up: Return in about 6 months (around 07/10/2022) for physical or sooner if needed.Schedule b4 you leave. ?

## 2022-01-09 ENCOUNTER — Other Ambulatory Visit: Payer: Self-pay

## 2022-01-09 ENCOUNTER — Other Ambulatory Visit: Payer: Self-pay | Admitting: Gastroenterology

## 2022-01-09 ENCOUNTER — Telehealth: Payer: Self-pay | Admitting: Family Medicine

## 2022-01-09 DIAGNOSIS — R82998 Other abnormal findings in urine: Secondary | ICD-10-CM

## 2022-01-09 NOTE — Telephone Encounter (Signed)
Called and spoke with pt and results given.  ?

## 2022-01-09 NOTE — Telephone Encounter (Signed)
Pt is returning a call to our office. She is asking a call back after 3pm please ?

## 2022-01-09 NOTE — Telephone Encounter (Signed)
Noted  

## 2022-01-10 ENCOUNTER — Other Ambulatory Visit: Payer: Medicare Other

## 2022-01-10 DIAGNOSIS — R82998 Other abnormal findings in urine: Secondary | ICD-10-CM

## 2022-01-11 LAB — URINE CULTURE
MICRO NUMBER:: 13295950
Result:: NO GROWTH
SPECIMEN QUALITY:: ADEQUATE

## 2022-01-22 ENCOUNTER — Ambulatory Visit (INDEPENDENT_AMBULATORY_CARE_PROVIDER_SITE_OTHER): Payer: Medicare Other | Admitting: Rehabilitative and Restorative Service Providers"

## 2022-01-22 ENCOUNTER — Encounter: Payer: Self-pay | Admitting: Rehabilitative and Restorative Service Providers"

## 2022-01-22 DIAGNOSIS — R293 Abnormal posture: Secondary | ICD-10-CM

## 2022-01-22 DIAGNOSIS — M6281 Muscle weakness (generalized): Secondary | ICD-10-CM | POA: Diagnosis not present

## 2022-01-22 DIAGNOSIS — M5459 Other low back pain: Secondary | ICD-10-CM

## 2022-01-22 DIAGNOSIS — M5416 Radiculopathy, lumbar region: Secondary | ICD-10-CM

## 2022-01-22 NOTE — Therapy (Signed)
?OUTPATIENT PHYSICAL THERAPY THORACOLUMBAR EVALUATION ? ? ?Patient Name: Cindy Schwartz ?MRN: [id number] ?DOB:Aug 18, 1934, 88 y.o., female ?Today's Date: 01/22/2022 ? ? PT End of Session - 01/22/22 1746   ? ? Visit Number 1   ? Number of Visits 12   ? Date for PT Re-Evaluation 03/19/22   ? Progress Note Due on Visit 10   ? PT Start Time 1015   ? PT Stop Time 1058   ? PT Time Calculation (min) 43 min   ? Activity Tolerance Patient tolerated treatment well;No increased pain   ? Behavior During Therapy Cindy Schwartz for tasks assessed/performed   ? ?  ?  ? ?  ? ? ?Past Medical History:  ?Diagnosis Date  ? Aneurysm of ascending aorta (HCC)   ? a. 4.2-4.3cm in 2014.  ? Anxiety   ? Arthritis   ? Breast cancer ([place]) 2018  ? right mastectomy  ? Cancer [facility]) 1980  ? ovarian cancer  ? Compression fracture of L1 lumbar vertebra (HCC)   ? COMPRESSION FRACTURE, THORACIC VERTEBRA 07/24/2010  ? Qualifier: Diagnosis of  By: [name], Balinda Quails   ? Elevated BP Transient 09/2014  ? Genetic testing 07/09/2017  ? Multi-Cancer panel (83 genes) @ [facility] - No pathogenic mutations detected  ? GERD (gastroesophageal reflux disease)   ? H/O bone density study   ? 2015  ? H/O colonoscopy   ? History of ovarian cancer   ? November 1979  ? History of ovarian cancer 1979  ? 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  ? Insomnia   ? Irregular heart beat   ? Normal coronary arteries Cath - 09/2014  ? Osteoporosis   ? Raynaud's disease   ? ?Past Surgical History:  ?Procedure Laterality Date  ? ABDOMINAL HYSTERECTOMY  1979  ? APPENDECTOMY    ? BILATERAL SALPINGOOPHORECTOMY    ? BREAST LUMPECTOMY    ? COLONOSCOPY  02/2012  ? LEFT HEART CATHETERIZATION WITH CORONARY ANGIOGRAM N/A 10/17/2014  ? Procedure: LEFT HEART CATHETERIZATION WITH CORONARY ANGIOGRAM;  Surgeon: [name], MD;  Location: [facility] CATH LAB;  Service: Cardiovascular;  Laterality: N/A;  ? ovarian ca s/p taj/bso  1979  ? SIMPLE MASTECTOMY WITH AXILLARY SENTINEL NODE BIOPSY Right 05/05/2017   ? Procedure: RIGHT TOTAL MASTECTOMY WITH RIGHT  AXILLARY SENTINEL NODE BIOPSY;  Surgeon: [name], MD;  Location: [place];  Service: General;  Laterality: Right;  PECTORAL BLOCK  ? ?Patient Active Problem List  ? Diagnosis Date Noted  ? Melanocytic nevi of right ear and external auricular canal 11/04/2021  ? History of neoplasm 11/04/2021  ? Melanocytic nevi of scalp and neck 11/04/2021  ? Osteoarthritis of carpometacarpal (CMC) joints of both thumbs 09/26/2021  ? Pharyngoesophageal dysphagia 04/09/2021  ? Sinus pressure 04/09/2021  ? Pain due to onychomycosis of toenails of both feet 12/25/2020  ? History of cerebellar stroke 10/05/2020  ? Memory loss 10/05/2020  ? Arthritis of both knees 09/10/2020  ? Osteoarthritis of both hands 08/29/2020  ? Bilateral hand pain 08/29/2020  ? Bilateral carpal tunnel syndrome 08/29/2020  ? Trigger thumb of left hand 08/29/2020  ? History of adenomatous polyp of colon 12/30/2019  ? Mild hyperlipidemia 06/01/2019  ? Degenerative arthritis of knee, bilateral 04/08/2019  ? Chronic idiopathic constipation 03/01/2019  ? Chronic cough 01/21/2018  ? Genetic testing 07/09/2017  ? history of Breast cancer of lower-outer quadrant of right female breast  01/16/2016  ? Palpitations 11/13/2014  ? Normal coronary arteries 10/17/2014  ? Varicose veins  of lower extremities with other complications 27/02/2375  ? Hx of herpes simplex infection 12/18/2011  ? Low back pain 06/28/2010  ? Lower abdominal pain 06/28/2010  ? Generalized anxiety disorder 10/13/2008  ? Irritable bowel syndrome 10/13/2008  ? Lung nodule 11/25/2007  ? Raynaud's syndrome 07/01/2007  ? Aneurysm of thoracic aorta ([place]) 05/06/2007  ? Allergic rhinitis 05/06/2007  ? Esophageal reflux 05/06/2007  ? DEGENERATIVE DISC DISEASE, CERVICAL SPINE 05/06/2007  ? Osteoporosis 03/18/2007  ? History of ovarian cancer 09/22/1977  ? ? ?PCP: Marin Olp MD ? ?REFERRING PROVIDER: Marin Olp, MD ? ?REFERRING DIAG: M81.8  (ICD-10-CM) - Other osteoporosis, unspecified pathological fracture presence M54.41,G89.29 (ICD-10-CM) - Chronic bilateral low back pain with right-sided sciatica  ? ?THERAPY DIAG:  ?Abnormal posture ? ?Muscle weakness (generalized) ? ?Other low back pain ? ?Radiculopathy, lumbar region ? ?ONSET DATE: Chronic, worsening recently ? ?SUBJECTIVE:                                                                                                                                                                                          ? ?SUBJECTIVE STATEMENT: ?Cindy Schwartz notes low back, R hip and R>L LE pain to the distal calf primarily in the morning.  Symptoms are also noted with prolonged sitting. ? ?PERTINENT HISTORY:  ?Cervical, knees and hands OA, osteoporosis, previous breast cancer, L1 and thoracic compression fractures ? ?PAIN:  ?Are you having pain? Yes: NPRS scale: 0-6/10 ?Pain location: Low back, R hip and R > L leg to the distal calf ?Pain description: Tightness, spasm in the back ?Aggravating factors: First thing in the morning when she starts to move, prolonged sitting ?Relieving factors: Getting up and moving around ? ? ?PRECAUTIONS: Back ? ?WEIGHT BEARING RESTRICTIONS No ? ?FALLS:  ?Has patient fallen in last 6 months? No ? ?LIVING ENVIRONMENT: ?Lives with: lives alone ?Lives in: House/apartment ?Stairs:  No problem with stairs ?Has following equipment at home: None ? ?OCCUPATION: Retired ? ?PLOF: Independent ? ?PATIENT GOALS Be able to get up and moving in the morning quickly without pain. ? ? ?OBJECTIVE:  ? ?DIAGNOSTIC FINDINGS:  ?No recent fracture is seen. Decrease in height of bodies of T12 and ?L1 vertebrae has not changed. Degenerative are noted in the lumbar ?spine, more so at L2-L3 and L5-S1 levels with interval progression. ? ?PATIENT SURVEYS:  ?FOTO 39 (Goal 58 in 12 visits) ? ?SCREENING FOR RED FLAGS: ?Bowel or bladder incontinence: No ?Spinal tumors: No ?Cauda equina syndrome: No ?Compression  fracture: Yes: Previous thoracic and lumbar ?Abdominal aneurysm: Yes: Previous ? ?COGNITION: ? Overall cognitive status: Within functional limits for tasks assessed   ?  ?  SENSATION: ?Not tested ? ?MUSCLE LENGTH: ?Hamstrings: Right 25 deg; Left 25 deg ? ?POSTURE:  ?Mildly rounded shoulders, forward head and decreased lumbar lordosis ? ?LUMBAR ROM:  ? ?Active  A/PROM  ?01/22/2022  ?Flexion   ?Extension 10  ?Right lateral flexion   ?Left lateral flexion   ?Right rotation   ?Left rotation   ? (Blank rows = not tested) ? ?LE ROM: ? ?Passive  Right ?01/22/2022 Left ?01/22/2022  ?Hip flexion 90 95  ?Hip extension    ?Hip abduction    ?Hip adduction    ?Hip internal rotation 10 7  ?Hip external rotation 18 26  ?Knee flexion    ?Knee extension    ?Ankle dorsiflexion    ?Ankle plantarflexion    ?Ankle inversion    ?Ankle eversion    ? (Blank rows = not tested) ? ?LE MMT: ? ?MMT Right ?01/22/2022 Left ?01/22/2022  ?Hip flexion    ?Hip extension    ?Hip abduction    ?Hip adduction    ?Hip internal rotation    ?Hip external rotation    ?Knee flexion    ?Knee extension    ?Ankle dorsiflexion    ?Ankle plantarflexion    ?Ankle inversion    ?Ankle eversion    ? (Blank rows = not tested) ? ? ?TODAY'S TREATMENT  ? ?01/22/2022: ?Therapeutic exercises: ?Standing lumbar extension AROM (hips forward) 10X 3 seconds ?Supine trunk rotation (bent knees side to side) 10X 10 seconds ?Supine hamstrings stretch (other leg straight) 5X 20 sceonds ?Supine figure 4 stretch 5X 20 seconds ? ?Functional Activities: Review exam findings, postural education, basic spine anatomy and starter HEP. ? ? ?PATIENT EDUCATION:  ?Education details: See above ?Person educated: Patient ?Education method: Explanation, Demonstration, Tactile cues, Verbal cues, and Handouts ?Education comprehension: verbalized understanding, returned demonstration, verbal cues required, tactile cues required, and needs further education ? ? ?HOME EXERCISE PROGRAM: ?Access Code: [email or web address] ?URL:  [email or web address] ?Date: 01/22/2022 ?Prepared by: Vista Mink ? ?Exercises ?- Standing Lumbar Extension at [place] 5 x daily - 7 x weekly - 1 sets - 5 reps - 3 seconds hold ?- Supine Corinna Capra

## 2022-01-31 ENCOUNTER — Encounter: Payer: Medicare Other | Admitting: Rehabilitative and Restorative Service Providers"

## 2022-02-03 ENCOUNTER — Ambulatory Visit (INDEPENDENT_AMBULATORY_CARE_PROVIDER_SITE_OTHER): Payer: Medicare Other | Admitting: Rehabilitative and Restorative Service Providers"

## 2022-02-03 ENCOUNTER — Encounter: Payer: Self-pay | Admitting: Rehabilitative and Restorative Service Providers"

## 2022-02-03 DIAGNOSIS — M5459 Other low back pain: Secondary | ICD-10-CM | POA: Diagnosis not present

## 2022-02-03 DIAGNOSIS — R293 Abnormal posture: Secondary | ICD-10-CM

## 2022-02-03 DIAGNOSIS — M6281 Muscle weakness (generalized): Secondary | ICD-10-CM | POA: Diagnosis not present

## 2022-02-03 NOTE — Therapy (Signed)
?OUTPATIENT PHYSICAL THERAPY TREATMENT NOTE ? ? ?Patient Name: Cindy Schwartz ?MRN: [id number] ?DOB:1934/06/21, 88 y.o., female ?Today's Date: 02/03/2022 ? ?PCP: Marin Olp, MD ? ?REFERRING PROVIDER: Marin Olp, MD ? ?END OF SESSION:  ? PT End of Session - 02/03/22 1603   ? ? Visit Number 2   ? Number of Visits 12   ? Date for PT Re-Evaluation 03/19/22   ? Progress Note Due on Visit 10   ? PT Start Time 1340   ? PT Stop Time 1430   ? PT Time Calculation (min) 50 min   ? Activity Tolerance Patient tolerated treatment well;No increased pain   ? Behavior During Therapy [facility] for tasks assessed/performed   ? ?  ?  ? ?  ? ? ?Past Medical History:  ?Diagnosis Date  ? Aneurysm of ascending aorta (HCC)   ? a. 4.2-4.3cm in 2014.  ? Anxiety   ? Arthritis   ? Breast cancer ([place]) 2018  ? right mastectomy  ? Cancer [facility]) 1980  ? ovarian cancer  ? Compression fracture of L1 lumbar vertebra (HCC)   ? COMPRESSION FRACTURE, THORACIC VERTEBRA 07/24/2010  ? Qualifier: Diagnosis of  By: [name], Balinda Quails   ? Elevated BP Transient 09/2014  ? Genetic testing 07/09/2017  ? Multi-Cancer panel (83 genes) @ [facility] - No pathogenic mutations detected  ? GERD (gastroesophageal reflux disease)   ? H/O bone density study   ? 2015  ? H/O colonoscopy   ? History of ovarian cancer   ? November 1979  ? History of ovarian cancer 1979  ? 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  ? Insomnia   ? Irregular heart beat   ? Normal coronary arteries Cath - 09/2014  ? Osteoporosis   ? Raynaud's disease   ? ?Past Surgical History:  ?Procedure Laterality Date  ? ABDOMINAL HYSTERECTOMY  1979  ? APPENDECTOMY    ? BILATERAL SALPINGOOPHORECTOMY    ? BREAST LUMPECTOMY    ? COLONOSCOPY  02/2012  ? LEFT HEART CATHETERIZATION WITH CORONARY ANGIOGRAM N/A 10/17/2014  ? Procedure: LEFT HEART CATHETERIZATION WITH CORONARY ANGIOGRAM;  Surgeon: [name], MD;  Location: [facility] CATH LAB;  Service: Cardiovascular;  Laterality: N/A;  ? ovarian ca s/p  taj/bso  1979  ? SIMPLE MASTECTOMY WITH AXILLARY SENTINEL NODE BIOPSY Right 05/05/2017  ? Procedure: RIGHT TOTAL MASTECTOMY WITH RIGHT  AXILLARY SENTINEL NODE BIOPSY;  Surgeon: [name], MD;  Location: [place];  Service: General;  Laterality: Right;  PECTORAL BLOCK  ? ?Patient Active Problem List  ? Diagnosis Date Noted  ? Melanocytic nevi of right ear and external auricular canal 11/04/2021  ? History of neoplasm 11/04/2021  ? Melanocytic nevi of scalp and neck 11/04/2021  ? Osteoarthritis of carpometacarpal (CMC) joints of both thumbs 09/26/2021  ? Pharyngoesophageal dysphagia 04/09/2021  ? Sinus pressure 04/09/2021  ? Pain due to onychomycosis of toenails of both feet 12/25/2020  ? History of cerebellar stroke 10/05/2020  ? Memory loss 10/05/2020  ? Arthritis of both knees 09/10/2020  ? Osteoarthritis of both hands 08/29/2020  ? Bilateral hand pain 08/29/2020  ? Bilateral carpal tunnel syndrome 08/29/2020  ? Trigger thumb of left hand 08/29/2020  ? History of adenomatous polyp of colon 12/30/2019  ? Mild hyperlipidemia 06/01/2019  ? Degenerative arthritis of knee, bilateral 04/08/2019  ? Chronic idiopathic constipation 03/01/2019  ? Chronic cough 01/21/2018  ? Genetic testing 07/09/2017  ? history of Breast cancer of lower-outer quadrant of right female  breast  01/16/2016  ? Palpitations 11/13/2014  ? Normal coronary arteries 10/17/2014  ? Varicose veins of lower extremities with other complications 40/98/1191  ? Hx of herpes simplex infection 12/18/2011  ? Low back pain 06/28/2010  ? Lower abdominal pain 06/28/2010  ? Generalized anxiety disorder 10/13/2008  ? Irritable bowel syndrome 10/13/2008  ? Lung nodule 11/25/2007  ? Raynaud's syndrome 07/01/2007  ? Aneurysm of thoracic aorta ([place]) 05/06/2007  ? Allergic rhinitis 05/06/2007  ? Esophageal reflux 05/06/2007  ? DEGENERATIVE DISC DISEASE, CERVICAL SPINE 05/06/2007  ? Osteoporosis 03/18/2007  ? History of ovarian cancer 09/22/1977  ? ? ?REFERRING DIAG:  M81.8 (ICD-10-CM) - Other osteoporosis, unspecified pathological fracture presence M54.41,G89.29 (ICD-10-CM) - Chronic bilateral low back pain with right-sided sciatica  ? ?THERAPY DIAG:  ?Abnormal posture ? ?Muscle weakness (generalized) ? ?Other low back pain ? ?PERTINENT HISTORY: Cervical, knees and hands OA, osteoporosis, previous breast cancer, L1 and thoracic compression fractures ? ?PRECAUTIONS: Back ? ?SUBJECTIVE: Kyla reports good early HEP compliance.  Mornings are still the most difficult time of the day. ? ?PAIN:  ?Are you having pain? Yes: NPRS scale: 0-6/10 ?Pain location: Low back, R hip and R > L leg to the distal calf ?Pain description: Tightness, spasm ?Aggravating factors: AM stiffness and stiffness with prolonged postures ?Relieving factors: Exercises and change of position ? ? ?OBJECTIVE: (objective measures completed at initial evaluation unless otherwise dated) ? ?OBJECTIVE:  ?  ?DIAGNOSTIC FINDINGS:  ?No recent fracture is seen. Decrease in height of bodies of T12 and ?L1 vertebrae has not changed. Degenerative are noted in the lumbar ?spine, more so at L2-L3 and L5-S1 levels with interval progression. ?  ?PATIENT SURVEYS:  ?FOTO 39 (Goal 58 in 12 visits) ?  ?SCREENING FOR RED FLAGS: ?Bowel or bladder incontinence: No ?Spinal tumors: No ?Cauda equina syndrome: No ?Compression fracture: Yes: Previous thoracic and lumbar ?Abdominal aneurysm: Yes: Previous ?  ?COGNITION: ?          Overall cognitive status: Within functional limits for tasks assessed               ?           ?SENSATION: ?Not tested ?  ?MUSCLE LENGTH: ?Hamstrings: Right 25 deg; Left 25 deg ?  ?POSTURE:  ?Mildly rounded shoulders, forward head and decreased lumbar lordosis ?  ?LUMBAR ROM:  ?  ?Active  A/PROM  ?01/22/2022  ?Flexion    ?Extension 10  ?Right lateral flexion    ?Left lateral flexion    ?Right rotation    ?Left rotation    ? (Blank rows = not tested) ?  ?LE ROM: ?  ?Passive  Right ?01/22/2022 Left ?01/22/2022  ?Hip flexion  90 95  ?Hip extension      ?Hip abduction      ?Hip adduction      ?Hip internal rotation 10 7  ?Hip external rotation 18 26  ?Knee flexion      ?Knee extension      ?Ankle dorsiflexion      ?Ankle plantarflexion      ?Ankle inversion      ?Ankle eversion      ? (Blank rows = not tested) ?  ?LE MMT: ?  ?MMT Right ?01/22/2022 Left ?01/22/2022  ?Hip flexion      ?Hip extension      ?Hip abduction      ?Hip adduction      ?Hip internal rotation      ?Hip external rotation      ?  Knee flexion      ?Knee extension      ?Ankle dorsiflexion      ?Ankle plantarflexion      ?Ankle inversion      ?Ankle eversion      ? (Blank rows = not tested) ?  ?  ?TODAY'S TREATMENT  ? 01/24/14/2023: ?Therapeutic exercises: ?Standing lumbar extension AROM (hips forward) 10X 3 seconds ?Supine trunk rotation (bent knees side to side) 10X 10 seconds ?Supine hamstrings stretch (other leg straight) 2X 20  ?Seconds ? Seated hamstrings stretch (foot on stool, foot in chair, leg on treatment table) 4X 20 seconds each ? Standing hamstrings stretch 2X 20 seconds ? Shoulder blade pinches 10X 5 seconds ? ?Supine Modified Thomas stretch (hand 1 leg off table) 5X each side 20 seconds ? ?Supine figure 4 stretch 5X 20 seconds ? ? ?01/22/2022: ?Therapeutic exercises: ?Standing lumbar extension AROM (hips forward) 10X 3 seconds ?Supine trunk rotation (bent knees side to side) 10X 10 seconds ?Supine hamstrings stretch (other leg straight) 5X 20 sceonds ?Supine figure 4 stretch 5X 20 seconds ?  ?Functional Activities: Review exam findings, postural education, basic spine anatomy and starter HEP. ?  ?  ?PATIENT EDUCATION:  ?Education details: See above ?Person educated: Patient ?Education method: Explanation, Demonstration, Tactile cues, Verbal cues, and Handouts ?Education comprehension: verbalized understanding, returned demonstration, verbal cues required, tactile cues required, and needs further education ?  ?  ?HOME EXERCISE PROGRAM: ? Access Code: [email or web address] ?URL:  [email or web address] ?Date: 02/03/2022 ?Prepared by: Vista Mink ? ?Exercises ?- Standing Lumbar Extension at [place] 5 x daily - 7 x weekly - 1 sets - 5 reps - 3 seconds hol

## 2022-02-04 ENCOUNTER — Ambulatory Visit (INDEPENDENT_AMBULATORY_CARE_PROVIDER_SITE_OTHER): Payer: Medicare Other | Admitting: Podiatry

## 2022-02-04 ENCOUNTER — Encounter: Payer: Self-pay | Admitting: Podiatry

## 2022-02-04 DIAGNOSIS — Q828 Other specified congenital malformations of skin: Secondary | ICD-10-CM

## 2022-02-04 DIAGNOSIS — M79675 Pain in left toe(s): Secondary | ICD-10-CM

## 2022-02-04 DIAGNOSIS — M79672 Pain in left foot: Secondary | ICD-10-CM

## 2022-02-04 DIAGNOSIS — L821 Other seborrheic keratosis: Secondary | ICD-10-CM | POA: Insufficient documentation

## 2022-02-04 DIAGNOSIS — B351 Tinea unguium: Secondary | ICD-10-CM | POA: Diagnosis not present

## 2022-02-04 DIAGNOSIS — M79671 Pain in right foot: Secondary | ICD-10-CM

## 2022-02-04 DIAGNOSIS — M79674 Pain in right toe(s): Secondary | ICD-10-CM

## 2022-02-10 ENCOUNTER — Ambulatory Visit
Admission: RE | Admit: 2022-02-10 | Discharge: 2022-02-10 | Disposition: A | Discharge status: Home / Self Care | Payer: Medicare Other | Source: Ambulatory Visit | Attending: General Surgery | Admitting: General Surgery

## 2022-02-10 ENCOUNTER — Other Ambulatory Visit: Payer: Self-pay | Admitting: Family Medicine

## 2022-02-10 DIAGNOSIS — Z1231 Encounter for screening mammogram for malignant neoplasm of breast: Secondary | ICD-10-CM

## 2022-02-10 NOTE — Progress Notes (Signed)
  Subjective:  Patient ID: Cindy Schwartz, female    DOB: 09-27-1933,  MRN: [id number]  Cindy Schwartz presents to clinic today for painful porokeratotic lesion(s) both feet and painful mycotic toenails that limit ambulation. Painful toenails interfere with ambulation. Aggravating factors include wearing enclosed shoe gear. Pain is relieved with periodic professional debridement. Painful porokeratotic lesions are aggravated when weightbearing with and without shoegear. Pain is relieved with periodic professional debridement.  Patient states it feels like her right great toenail is growing into the skin on the lateral side. She denies any redness, drainage or swelling.   PCP is Marin Olp, MD , and last visit was June 26, 2021.  Allergies  Allergen Reactions   Chlorhexidine Gluconate Rash    Review of Systems: Negative except as noted in the HPI.  Objective: No changes noted in today's physical examination.  Constitutional Cindy Schwartz is a pleasant 88 y.o. Caucasian female, WD, WN in NAD. AAO x 3.   Vascular CFT <3 seconds b/l LE. Palpable pedal pulses b/l LE. Pedal hair sparse. No pain with calf compression b/l. Lower extremity skin temperature gradient within normal limits. No edema noted b/l LE. Varicosities present b/l. No cyanosis or clubbing noted b/l LE.  Neurologic Normal speech. Oriented to person, place, and time. Protective sensation intact 5/5 intact bilaterally with 10g monofilament b/l. Vibratory sensation intact b/l.  Dermatologic Pedal skin thin, shiny and atrophic b/l LE. No open wounds b/l LE. No interdigital macerations noted b/l LE. Toenails 1-5 bilaterally elongated, discolored, dystrophic, thickened, and crumbly with subungual debris and tenderness to dorsal palpation. Porokeratotic lesion(s) submet head 2 b/l. No erythema, no edema, no drainage, no fluctuance.  Orthopedic: Muscle strength 5/5 to all lower extremity muscle groups bilaterally. No pain, crepitus or joint  limitation noted with ROM bilateral LE. Plantarflexed metatarsal(s) 2nd b/l feet.   Radiographs: None  Assessment/Plan: 1. Pain due to onychomycosis of toenails of both feet   2. Porokeratosis   3. Pain in both feet     -Consent given for treatment as described below: -Examined patient. -Medicare ABN signed. Patient consents for services of paring of corn(s)/callus(es)/porokeratos(es) today. Copy in patient chart. -Toenails 1-5 b/l were debrided in length and girth with sterile nail nippers and dremel without iatrogenic bleeding.  -Offending nail border debrided and curretaged R hallux utilizing sterile nail nipper and currette. Border cleansed with alcohol and triple antibiotic applied. No further treatment required by patient/caregiver. Call office if there are any concerns. -Porokeratotic lesion(s) submet head 2 b/l pared and enucleated with sterile scalpel blade without incident. Total number of lesions debrided=2. -Patient/POA to call should there be question/concern in the interim.   Return in about 3 months (around 05/07/2022).  Marzetta Board, DPM

## 2022-02-12 ENCOUNTER — Ambulatory Visit (INDEPENDENT_AMBULATORY_CARE_PROVIDER_SITE_OTHER): Payer: Medicare Other | Admitting: Rehabilitative and Restorative Service Providers"

## 2022-02-12 ENCOUNTER — Encounter: Payer: Self-pay | Admitting: Rehabilitative and Restorative Service Providers"

## 2022-02-12 DIAGNOSIS — M5459 Other low back pain: Secondary | ICD-10-CM | POA: Diagnosis not present

## 2022-02-12 DIAGNOSIS — M6281 Muscle weakness (generalized): Secondary | ICD-10-CM

## 2022-02-12 DIAGNOSIS — R293 Abnormal posture: Secondary | ICD-10-CM | POA: Diagnosis not present

## 2022-02-12 DIAGNOSIS — M5416 Radiculopathy, lumbar region: Secondary | ICD-10-CM

## 2022-02-12 NOTE — Therapy (Signed)
OUTPATIENT PHYSICAL THERAPY TREATMENT NOTE   Patient Name: Cindy Schwartz MRN: [id number] DOB:[birth year], 88 y.o., female Today's Date: 02/13/2022  PCP: Marin Olp, MD  REFERRING PROVIDER: Marin Olp, MD  PHYSICAL THERAPY DISCHARGE SUMMARY  Visits from Start of Care: 3  Current functional level related to goals / functional outcomes: See note   Remaining deficits: See note   Education / Equipment: Updated HEP   Patient agrees to discharge. Patient goals were met. Patient is being discharged due to being pleased with the current functional level.   END OF SESSION:   PT End of Session - 02/12/22 1402     Visit Number 3    Number of Visits 12    Date for PT Re-Evaluation 03/19/22    Progress Note Due on Visit 10    PT Start Time 3491    PT Stop Time 1430    PT Time Calculation (min) 43 min    Activity Tolerance Patient tolerated treatment well;No increased pain    Behavior During Therapy WFL for tasks assessed/performed              Past Medical History:  Diagnosis Date   Aneurysm of ascending aorta ([place])    a. 4.2-4.3cm in 2014.   Anxiety    Arthritis    Breast cancer ([place]) 2018   right mastectomy   Cancer ([place]) 1980   ovarian cancer   Compression fracture of L1 lumbar vertebra ([place])    COMPRESSION FRACTURE, THORACIC VERTEBRA 07/24/2010   Qualifier: Diagnosis of  By: [name], [name]    Elevated BP Transient 09/2014   Genetic testing 07/09/2017   Multi-Cancer panel (83 genes) @ [facility] - No pathogenic mutations detected   GERD (gastroesophageal reflux disease)    H/O bone density study    2015   H/O colonoscopy    History of ovarian cancer    November 1979   History of ovarian cancer 1979   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   Insomnia    Irregular heart beat    Normal coronary arteries Cath - 09/2014   Osteoporosis    Raynaud's disease    Past Surgical History:  Procedure Laterality Date   ABDOMINAL  HYSTERECTOMY  1979   APPENDECTOMY     BILATERAL SALPINGOOPHORECTOMY     BREAST LUMPECTOMY     COLONOSCOPY  02/2012   LEFT HEART CATHETERIZATION WITH CORONARY ANGIOGRAM N/A 10/17/2014   Procedure: LEFT HEART CATHETERIZATION WITH CORONARY ANGIOGRAM;  Surgeon: [name], MD;  Location: [facility] CATH LAB;  Service: Cardiovascular;  Laterality: N/A;   ovarian ca s/p taj/bso  1979   SIMPLE MASTECTOMY WITH AXILLARY SENTINEL NODE BIOPSY Right 05/05/2017   Procedure: RIGHT TOTAL MASTECTOMY WITH RIGHT  AXILLARY SENTINEL NODE BIOPSY;  Surgeon: [name], MD;  Location: [facility];  Service: General;  Laterality: Right;  PECTORAL BLOCK   Patient Active Problem List   Diagnosis Date Noted   Other seborrheic keratosis 02/04/2022   Melanocytic nevi of right ear and external auricular canal 11/04/2021   History of neoplasm 11/04/2021   Melanocytic nevi of scalp and neck 11/04/2021   Osteoarthritis of carpometacarpal (CMC) joints of both thumbs 09/26/2021   Pharyngoesophageal dysphagia 04/09/2021   Sinus pressure 04/09/2021   Pain due to onychomycosis of toenails of both feet 12/25/2020   History of cerebellar stroke 10/05/2020   Memory loss 10/05/2020   Arthritis of both knees 09/10/2020   Osteoarthritis of both hands 08/29/2020   Bilateral  hand pain 08/29/2020   Bilateral carpal tunnel syndrome 08/29/2020   Trigger thumb of left hand 08/29/2020   History of adenomatous polyp of colon 12/30/2019   Mild hyperlipidemia 06/01/2019   Degenerative arthritis of knee, bilateral 04/08/2019   Chronic idiopathic constipation 03/01/2019   Chronic cough 01/21/2018   Genetic testing 07/09/2017   history of Breast cancer of lower-outer quadrant of right female breast  01/16/2016   Palpitations 11/13/2014   Normal coronary arteries 10/17/2014   Varicose veins of lower extremities with other complications 96/75/9163   Hx of herpes simplex infection 12/18/2011   Low back pain 06/28/2010   Lower abdominal  pain 06/28/2010   Generalized anxiety disorder 10/13/2008   Irritable bowel syndrome 10/13/2008   Lung nodule 11/25/2007   Raynaud's syndrome 07/01/2007   Aneurysm of thoracic aorta ([place]) 05/06/2007   Allergic rhinitis 05/06/2007   Esophageal reflux 05/06/2007   DEGENERATIVE DISC DISEASE, CERVICAL SPINE 05/06/2007   Osteoporosis 03/18/2007   History of ovarian cancer 09/22/1977    REFERRING DIAG: M81.8 (ICD-10-CM) - Other osteoporosis, unspecified pathological fracture presence M54.41,G89.29 (ICD-10-CM) - Chronic bilateral low back pain with right-sided sciatica   THERAPY DIAG:  Abnormal posture  Muscle weakness (generalized)  Other low back pain  Radiculopathy, lumbar region  PERTINENT HISTORY: Cervical, knees and hands OA, osteoporosis, previous breast cancer, L1 and thoracic compression fractures  PRECAUTIONS: Back  SUBJECTIVE: Tameika reports good HEP compliance.  Mornings are still the most difficult time of the day, although she is much better and is able to quickly feel better with her exercises and postural awareness.  PAIN:  Are you having pain? Yes: NPRS scale: 0-3/10 Pain location: Low back, R hip and rare R > L leg to the distal calf Pain description: Tightness, spasm Aggravating factors: AM stiffness and stiffness with prolonged postures Relieving factors: Exercises and change of position   OBJECTIVE: (objective measures completed at initial evaluation unless otherwise dated)  OBJECTIVE:    DIAGNOSTIC FINDINGS:  No recent fracture is seen. Decrease in height of bodies of T12 and L1 vertebrae has not changed. Degenerative are noted in the lumbar spine, more so at L2-L3 and L5-S1 levels with interval progression.   PATIENT SURVEYS:  FOTO 70 (Goal 78) FOTO 39 (Goal 58 in 12 visits)   SCREENING FOR RED FLAGS: Bowel or bladder incontinence: No Spinal tumors: No Cauda equina syndrome: No Compression fracture: Yes: Previous thoracic and lumbar Abdominal  aneurysm: Yes: Previous   COGNITION:           Overall cognitive status: Within functional limits for tasks assessed                          SENSATION: Not tested   MUSCLE LENGTH: 02/12/2022 Hamstrings: R 30 deg; L 30 deg Hamstrings: Right 25 deg; Left 25 deg   POSTURE:  Mildly rounded shoulders, forward head and decreased lumbar lordosis   LUMBAR ROM:    Active  A/PROM  01/22/2022 AROM 02/12/2022  Flexion     Extension 10 10  Right lateral flexion     Left lateral flexion     Right rotation     Left rotation      (Blank rows = not tested)   LE ROM:   Passive  Right 01/22/2022 Left 01/22/2022 L/R in degrees 02/12/2022  Hip flexion 90 95 105/105  Hip extension       Hip abduction       Hip adduction  Hip internal rotation 10 7 9/9  Hip external rotation 18 26 26/26  Knee flexion       Knee extension       Ankle dorsiflexion       Ankle plantarflexion       Ankle inversion       Ankle eversion        (Blank rows = not tested)   LE MMT:   MMT Right 01/22/2022 Left 01/22/2022  Hip flexion      Hip extension      Hip abduction      Hip adduction      Hip internal rotation      Hip external rotation      Knee flexion      Knee extension      Ankle dorsiflexion      Ankle plantarflexion      Ankle inversion      Ankle eversion       (Blank rows = not tested)     TODAY'S TREATMENT  02/12/2022: Therapeutic exercises: Standing lumbar extension AROM (hips forward) 10X 3 seconds Supine trunk rotation (bent knees side to side) 10X 10 seconds  Seated hamstrings stretch (in chair with good lumbar lordosis) 4X 20 seconds  Shoulder blade pinches 10X 5 seconds Supine Modified Thomas stretch (1 leg off table) 5X each side 20 seconds Supine figure 4 stretch 5X 20 seconds  Therapeutic Activities: Review imaging and relate to spine anatomy for body mechanics (IE avoid flexion and prolonged sitting), reviewed body mechanics including log roll     02/03/2022: Therapeutic exercises: Standing lumbar extension AROM (hips forward) 10X 3 seconds Supine trunk rotation (bent knees side to side) 10X 10 seconds Supine hamstrings stretch (other leg straight) 2X 20  Seconds  Seated hamstrings stretch (foot on stool, foot in chair, leg on treatment table) 4X 20 seconds each  Standing hamstrings stretch 2X 20 seconds  Shoulder blade pinches 10X 5 seconds  Supine Modified Thomas stretch (hand 1 leg off table) 5X each side 20 seconds  Supine figure 4 stretch 5X 20 seconds   01/22/2022: Therapeutic exercises: Standing lumbar extension AROM (hips forward) 10X 3 seconds Supine trunk rotation (bent knees side to side) 10X 10 seconds Supine hamstrings stretch (other leg straight) 5X 20 sceonds Supine figure 4 stretch 5X 20 seconds   Functional Activities: Review exam findings, postural education, basic spine anatomy and starter HEP.     PATIENT EDUCATION:  Education details: See above Person educated: Patient Education method: Explanation, Demonstration, Tactile cues, Verbal cues, and Handouts Education comprehension: verbalized understanding, returned demonstration, verbal cues required, tactile cues required, and needs further education     HOME EXERCISE PROGRAM: Access Code: [email or web address] URL: [email or web address] Date: 02/12/2022 Prepared by: Vista Mink  Exercises - Standing Lumbar Extension at [place]  - 5 x daily - 7 x weekly - 1 sets - 5 reps - 3 seconds hold - Supine Lower Trunk Rotation  - 1-2 x daily - 1-7 x weekly - 1 sets - 5 reps - 10 seconds hold - Supine Figure 4 Piriformis Stretch  - 1-2 x daily - 7 x weekly - 1 sets - 4-5 reps - 20 seconds hold - Seated Table Hamstring Stretch  - 1-2 x daily - 7 x weekly - 1 sets - 4-5 reps - 20 seconds hold - Modified Thomas Stretch  - 1-2 x daily - 7 x weekly - 1 sets - 4-5 reps - 20 seconds hold - Standing  Scapular Retraction  - 5 x daily - 7 x weekly - 1 sets - 5  reps - 5 second hold  ASSESSMENT:   CLINICAL IMPRESSION: Brandy reports "significantly improved" low back and leg pain since starting PT.  We spent time reviewing body mechanics, imaging and spine anatomy to reinforce long-term good body mechanics.  We also needed time to correct posture (avoid lumbar flexion) with seated hamstrings stretch.  She is happy with her progress and chooses to transition into independent PT.     OBJECTIVE IMPAIRMENTS decreased knowledge of condition, decreased ROM, decreased strength, decreased safety awareness, impaired perceived functional ability, impaired flexibility, improper body mechanics, postural dysfunction, and pain.    ACTIVITY LIMITATIONS community activity and sleeping .    PERSONAL FACTORS Cervical, knees and hands OA, osteoporosis, previous breast cancer, L1 and thoracic compression fractures are also affecting patient's functional outcome.      REHAB POTENTIAL: Good   CLINICAL DECISION MAKING: Stable/uncomplicated   EVALUATION COMPLEXITY: Low     GOALS: Goals reviewed with patient? Yes   SHORT TERM GOALS: Target date: 02/19/2022   Marvel will have improved postural awareness and will be able to implement this into all ADLs. Baseline: Started education 01/22/2022 Goal status: Met 02/12/2022   2.  Brinly will be independent with her day 1 HEP. Baseline: Started 01/22/2022 Goal status: Met 02/12/2022   LONG TERM GOALS: Target date: 03/19/2022   Improve low back pain to 0-3/10 with no radicular pain. Baseline: Can be 6/10 with sleeping and prolonged sitting Goal status: Met 02/12/2022   2.  Improve FOTO to 58. Baseline: 39 Goal status: Met 02/12/2022 (70)   3.  Improve B LE flexibility for hip flexors to 100/100; hamstrings to 45/45 and hip ER to 40/40 (L/R in degrees). Baseline: 95/90; 25/25; 26/18 respectively Goal status: Improved from evaluation 02/12/2022   4.  Dailin will be independent with her long-term HEP at DC. Baseline: Started  01/22/2022 Goal status: Met 02/12/22     PLAN: PT FREQUENCY: DC   PT DURATION: DC   PLANNED INTERVENTIONS: Therapeutic exercises, Therapeutic activity, Patient/Family education, Dry Needling, and Manual therapy.   PLAN FOR NEXT SESSION: DC      Farley Ly, PT, MPT 02/13/2022, 8:49 AM

## 2022-02-27 ENCOUNTER — Telehealth: Payer: Self-pay | Admitting: Family Medicine

## 2022-02-27 NOTE — Telephone Encounter (Signed)
See below

## 2022-02-27 NOTE — Telephone Encounter (Signed)
Pt states she is still experiencing pain and has been going to physical therapy as well as doing exercises at home.   Pt is requesting Rx for muscle relaxant.  Pt states this was discussed with PCP at a previous appointment and does not want to make an appointment.  Is OV needed?     FO Rep scheduled the pt for 06/12 in case OV is required.   Route back to [place] pool for follow up.

## 2022-02-27 NOTE — Telephone Encounter (Signed)
We discussed prednisone- can you see if she is ok with me sending that in as next step?

## 2022-02-28 MED ORDER — PREDNISONE 20 MG PO TABS
ORAL_TABLET | ORAL | 0 refills | Status: DC
Start: 2022-02-28 — End: 2022-03-17

## 2022-02-28 NOTE — Telephone Encounter (Signed)
Called and lm on pt vm tcb. 

## 2022-02-28 NOTE — Telephone Encounter (Signed)
Pt returned call and she would like to try prednisone sent to the pharmacy on file.

## 2022-03-03 ENCOUNTER — Ambulatory Visit: Payer: Medicare Other | Admitting: Family Medicine

## 2022-03-12 ENCOUNTER — Ambulatory Visit: Payer: Medicare Other | Admitting: Adult Health

## 2022-03-12 ENCOUNTER — Ambulatory Visit: Payer: Medicare Other | Admitting: Physician Assistant

## 2022-03-12 ENCOUNTER — Emergency Department (HOSPITAL_BASED_OUTPATIENT_CLINIC_OR_DEPARTMENT_OTHER): Payer: Medicare Other | Admitting: Radiology

## 2022-03-12 ENCOUNTER — Encounter (HOSPITAL_BASED_OUTPATIENT_CLINIC_OR_DEPARTMENT_OTHER): Payer: Self-pay

## 2022-03-12 ENCOUNTER — Emergency Department (HOSPITAL_BASED_OUTPATIENT_CLINIC_OR_DEPARTMENT_OTHER)
Admission: EM | Admit: 2022-03-12 | Discharge: 2022-03-12 | Disposition: A | Discharge status: Home / Self Care | Payer: Medicare Other | Attending: Emergency Medicine | Admitting: Emergency Medicine

## 2022-03-12 ENCOUNTER — Telehealth: Payer: Self-pay | Admitting: Family Medicine

## 2022-03-12 ENCOUNTER — Other Ambulatory Visit: Payer: Self-pay

## 2022-03-12 DIAGNOSIS — R5383 Other fatigue: Secondary | ICD-10-CM

## 2022-03-12 DIAGNOSIS — Z7982 Long term (current) use of aspirin: Secondary | ICD-10-CM | POA: Diagnosis not present

## 2022-03-12 DIAGNOSIS — E871 Hypo-osmolality and hyponatremia: Secondary | ICD-10-CM | POA: Diagnosis not present

## 2022-03-12 LAB — URINALYSIS, ROUTINE W REFLEX MICROSCOPIC
Bilirubin Urine: NEGATIVE
Glucose, UA: NEGATIVE mg/dL
Hgb urine dipstick: NEGATIVE
Ketones, ur: NEGATIVE mg/dL
Leukocytes,Ua: NEGATIVE
Nitrite: NEGATIVE
Protein, ur: NEGATIVE mg/dL
Specific Gravity, Urine: <1.005 — ABNORMAL LOW (ref 1.005–1.030)
pH: 7.5 (ref 5.0–8.0)

## 2022-03-12 LAB — TROPONIN I (HIGH SENSITIVITY)
Troponin I (High Sensitivity): 3 ng/L (ref <18)
Troponin I (High Sensitivity): 4 ng/L (ref <18)

## 2022-03-12 LAB — BASIC METABOLIC PANEL
Anion gap: 8 (ref 5–15)
BUN: 14 mg/dL (ref 8–23)
CO2: 27 mmol/L (ref 22–32)
Calcium: 9.9 mg/dL (ref 8.9–10.3)
Chloride: 96 mmol/L — ABNORMAL LOW (ref 98–111)
Creatinine, Ser: 0.67 mg/dL (ref 0.44–1.00)
GFR, Estimated: >60 mL/min (ref >60)
Glucose, Bld: 96 mg/dL (ref 70–99)
Potassium: 5.2 mmol/L — ABNORMAL HIGH (ref 3.5–5.1)
Sodium: 131 mmol/L — ABNORMAL LOW (ref 135–145)

## 2022-03-12 LAB — CBC
HCT: 39.9 % (ref 36.0–46.0)
Hemoglobin: 12.9 g/dL (ref 12.0–15.0)
MCH: 27.6 pg (ref 26.0–34.0)
MCHC: 32.3 g/dL (ref 30.0–36.0)
MCV: 85.3 fL (ref 80.0–100.0)
Platelets: 269 10*3/uL (ref 150–400)
RBC: 4.68 MIL/uL (ref 3.87–5.11)
RDW: 14.6 % (ref 11.5–15.5)
WBC: 7.4 10*3/uL (ref 4.0–10.5)
nRBC: 0 % (ref 0.0–0.2)

## 2022-03-12 LAB — TSH: TSH: 0.863 u[IU]/mL (ref 0.350–4.500)

## 2022-03-12 NOTE — Telephone Encounter (Signed)
Although pt was recommended to see HCP within 4 hours, due to severity of symptoms she was instructed by Inda Coke to go to the ED or call 911 immediately. Pt was informed of this and verbalized understanding.

## 2022-03-12 NOTE — ED Provider Notes (Signed)
Urinalysis negative for urinary tract infection.  Discharge arranged by Dr. Pearline Cables.   Fredia Sorrow, MD 03/12/22 [phone]

## 2022-03-12 NOTE — Telephone Encounter (Signed)
Pt states the new rx, Prednisone, is making her sick. She is feeling flushed, heavy headed, and not feeling well. She is asking if this is normal or should she stop taking it. Please advise

## 2022-03-12 NOTE — Telephone Encounter (Signed)
Went up front to schedulers told [name], please call pt and have her go to the ED now due to symptoms. [name] verbalized understanding and called pt and told her to go to ED now. [name] aware, appt cancelled for here today.

## 2022-03-12 NOTE — Telephone Encounter (Signed)
Per triage note patient needed to be seen by a HCP within 4 hours. I was able to schedule patient on June 21,2023 at 11:20am with Inda Coke.

## 2022-03-12 NOTE — ED Triage Notes (Signed)
Patient here POV from Home.  Endorses seeing PCP for Back/Hip Pain a Few Weeks PTA. Given Steroids to take PO at Home and states she finished them Monday. States during Steroid Therapy she has been Fatigued and has been feeling "Different" in the Chest.  No N/V/D. No Urinary Symptoms. No Fevers.   NAD Noted during Triage. A&Ox4. GCS 15. Ambulatory.

## 2022-03-12 NOTE — Telephone Encounter (Signed)
Called pt to schedule an office visit and pt stated she is feeling worse. Head is feeling extremely heavy, eyes are also heavy, and heart does not feel right. She states it feels like palpitations. Stated she doesn't feel right. Currently being triaged.

## 2022-03-12 NOTE — Discharge Instructions (Signed)
Please follow-up with your primary care physician if any abnormal thyroid results or if symptoms do not begin to resolve in the next 5 to 7 days.

## 2022-03-12 NOTE — ED Provider Notes (Signed)
[facility]ERGENC[facility] DEPT Provider Note   CSN: [id number] Arrival date & time: 03/12/22  1201     History  Chief Complaint  Patient presents with   Fatigue    Cindy Schwartz is a 88 y.o. female.  Patient is an 88 year old female with no pertinent past medical history presenting for complaints of fatigue.  Patient midst to generalized fatigue and weakness stating her eyelids feel heavy over the last week.  Patient admits to new medication of prednisone for back and hip pain and osteoarthritis.  Patient denies any falls or head trauma.  Denies any black or bloody stools.  Denies any illness including no fevers, chills, nausea, vomiting, abdominal pain, diarrhea.  Denies dysuria, increased frequency or urgency.  Chest pain or shortness of breath.  No prior history of thyroid disease.  No shortness of breath or difficulty breathing.  No prior history of hypercarbia or OSA.  The history is provided by the patient. No language interpreter was used.       Home Medications Prior to Admission medications   Medication Sig Start Date End Date Taking? Authorizing Provider  Ascorbic Acid (VITAMIN C) 1000 MG tablet Take 1,000 mg by mouth daily.    [provider]  ASPIRIN LOW DOSE 81 MG tablet TAKE 1 TABLET DAILY (SWALLOW WHOLE) 02/11/22   Marin Olp, MD  Cholecalciferol 25 MCG (1000 UT) capsule Take by mouth.    [provider]  COLLAGEN PO Take by mouth.    [provider]  esomeprazole (NEXIUM) 20 MG capsule Take 20 mg by mouth daily at 12 noon. Reports still taking this- fell off medication list    [provider]  famotidine (PEPCID) 40 MG tablet TAKE 1 TABLET(40 MG) BY MOUTH TWICE DAILY 01/09/22   Ladene Artist, MD  fluticasone [facility]) 50 MCG/ACT nasal spray SHAKE LIQUID AND USE 2 SPRAYS IN [facility] NOSTRIL DAILY 07/02/21   Marin Olp, MD  hydrocortisone (ANUSOL-HC) 2.5 % rectal cream Apply topically 2 (two) times daily. 07/19/21    [provider]  MAGNESIUM PO Take 300 mg by mouth.    [provider]  predniSONE (DELTASONE) 20 MG tablet Take 2 pills for 3 days, 1 pill for 4 days 02/28/22   Marin Olp, MD  rosuvastatin (CRESTOR) 5 MG tablet Take 1 tablet (5 mg total) by mouth daily. Take 1 tablet daily by mouth. 06/26/21   Marin Olp, MD  valACYclovir (VALTREX) 500 MG tablet Take by mouth. 12/24/15   [provider]      Allergies    Chlorhexidine gluconate    Review of Systems   Review of Systems  Constitutional:  Positive for fatigue. Negative for chills and fever.  HENT:  Negative for ear pain and sore throat.   Eyes:  Negative for pain and visual disturbance.  Respiratory:  Negative for cough and shortness of breath.   Cardiovascular:  Negative for chest pain and palpitations.  Gastrointestinal:  Negative for abdominal pain and vomiting.  Genitourinary:  Negative for dysuria and hematuria.  Musculoskeletal:  Negative for arthralgias and back pain.  Skin:  Negative for color change and rash.  Neurological:  Positive for weakness (generalized). Negative for seizures and syncope.  All other systems reviewed and are negative.   Physical Exam Updated Vital Signs BP (!) 157/76   Pulse 60   Temp 97.7 F (36.5 C)   Resp 10   Ht '5\' 5"'$  (1.651 m)   Wt 64 kg  LMP  (LMP Unknown)   SpO2 100%   BMI 23.48 kg/m  Physical Exam Vitals and nursing note reviewed.  Constitutional:      General: She is not in acute distress.    Appearance: She is well-developed.  HENT:     Head: Normocephalic and atraumatic.  Eyes:     Conjunctiva/sclera: Conjunctivae normal.  Cardiovascular:     Rate and Rhythm: Normal rate and regular rhythm.     Heart sounds: No murmur heard. Pulmonary:     Effort: Pulmonary effort is normal. No respiratory distress.     Breath sounds: Normal breath sounds.  Abdominal:     Palpations: Abdomen is soft.     Tenderness: There is no abdominal tenderness.   Musculoskeletal:        General: No swelling.     Cervical back: Neck supple.  Skin:    General: Skin is warm and dry.     Capillary Refill: Capillary refill takes less than 2 seconds.     Coloration: Skin is not pale.  Neurological:     General: No focal deficit present.     Mental Status: She is alert and oriented to person, place, and time.     GCS: GCS eye subscore is 4. GCS verbal subscore is 5. GCS motor subscore is 6.     Cranial Nerves: Cranial nerves 2-12 are intact.     Sensory: Sensation is intact.     Motor: Motor function is intact.     Coordination: Coordination is intact.  Psychiatric:        Mood and Affect: Mood normal.     ED Results / Procedures / Treatments   Labs (all labs ordered are listed, but only abnormal results are displayed) Labs Reviewed  BASIC METABOLIC PANEL - Abnormal; Notable for the following components:      Result Value   Sodium 131 (*)    Potassium 5.2 (*)    Chloride 96 (*)    All other components within normal limits  CBC  TSH  URINALYSIS, ROUTINE W REFLEX MICROSCOPIC  TROPONIN I (HIGH SENSITIVITY)  TROPONIN I (HIGH SENSITIVITY)    EKG None  Radiology DG Chest 2 View  Result Date: 03/12/2022 CLINICAL DATA:  Chest pain EXAM: CHEST - 2 VIEW COMPARISON:  Previous studies including the examination of 06/21/2018 FINDINGS: Cardiac size is within normal limits. Increase in AP diameter of chest suggests COPD. There are no signs of pulmonary edema or new focal infiltrates. There is no pleural effusion or pneumothorax. Pleural thickening is seen in both apices. There are small linear densities in both apices. These findings appear stable. IMPRESSION: COPD.  There are no new infiltrates or signs of pulmonary edema. Electronically Signed   By: Elmer Picker M.D.   On: 03/12/2022 13:35    Procedures Procedures    Medications Ordered in ED Medications - No data to display  ED Course/ Medical Decision Making/ A&P                            Medical Decision Making Amount and/or Complexity of Data Reviewed Labs: ordered. Radiology: ordered.   88 year old female presenting for fatigue after taking prednisone x7 days.  Patient is alert and oriented x3, no acute distress, afebrile, stable signs.    Generalized weakness and fatigue:  -No focal neurological deficits.  Low suspicion for CVA.  -Stable ECG with no ST segment elevation or depression.  Stable intervals.  Stable rate.  Finding, electrolytes, chest x-ray within normal limits.  -Patient denies any hematuria, hematemesis, hematochezia, or melena.  Hemoglobin stable.  No anemia.  -No hypoxia or respiratory distress.  -No signs or symptoms of sepsis or illness.  Leukocytosis.  -TSH ordered and pending.  Patient knows these test results are sent out to another facility and will take over 24 hours.  Recommended to follow her MyChart results and follow-up with primary care physician if abnormal.  -UA pending. Pt signed out to oncoming physician Dr. Rogene Houston while awaiting results.         Final Clinical Impression(s) / ED Diagnoses Final diagnoses:  Other fatigue  Hyponatremia    Rx / DC Orders ED Discharge Orders     None         Lianne Cure, DO 16/60/60 1541

## 2022-03-12 NOTE — Telephone Encounter (Signed)
See HCP within 4 hours (or PCP Triage) Pt scheduled for 06/21 at [facility] with SW.    Patient Name: Cindy Schwartz IC Gender: Female DOB: 1934-03-19 Age: 88 Y 2 M 26 D Return Phone Number: [phone] (Primary) Address: City/ State/ Zip: [place]  [address] Client [facility] [place] Client Site [facility] [place] Day Provider Garret Reddish- MD Contact Type Call Who Is Calling Patient / Member / Family / Caregiver Call Type Triage / Clinical Relationship To Patient Self Return Phone Number [phone] (Primary) Chief Complaint CHEST PAIN - pain, pressure, heaviness or tightness Reason for Call Symptomatic / Request for Health Information Initial Comment Caller is [place] with [employer] -patient having medication reaction, she's flushed and heavy head and can barely keep eyes open and having heart palpitations, she feels as it its slowing down and she's a heart patient, weak. the medication is called Washta Not Listed UC Translation No Nurse Assessment Nurse: Humfleet, RN, Estill Bamberg Date/Time (Eastern Time): 03/12/2022 9:47:32 AM Confirm and document reason for call. If symptomatic, describe symptoms. ---caller states she took prednisone for a week and finished it. no palpitations. last night she felt like something was not right. head is heavy, eyes are heavy, low energy. was taking prednisone for back pain radiating down legs. back pain better now. Does the patient have any new or worsening symptoms? ---Yes Will a triage be completed? ---Yes Related visit to physician within the last 2 weeks? ---Yes Does the PT have any chronic conditions? (i.e. diabetes, asthma, this includes High risk factors for pregnancy, etc.) ---Yes List chronic conditions. ---palpitations Is this a behavioral health or substance abuse call? ---No  Guidelines Guideline Title Affirmed Question Affirmed Notes Nurse  Date/Time (Eastern Time) Weakness (Generalized) and Fatigue [1] MODERATE weakness (i.e., interferes with work, school, normal activities) AND [2] cause unknown (Exceptions: Weakness from acute minor illness or poor fluid intake; weakness is chronic and not worse.) Humfleet, RN, Estill Bamberg 03/12/2022 9:52:46 AM Disp. Time Eilene Ghazi Time) Disposition Final User 03/12/2022 9:45:52 AM Send to Urgent Daron Offer, Lanette 03/12/2022 9:58:58 AM See HCP within 4 Hours (or PCP triage) Yes Humfleet, RN, Shelly Coss Disagree/Comply Comply Caller Understands Yes PreDisposition Did not know what to do Care Advice Given Per Guideline SEE HCP (OR PCP TRIAGE) WITHIN 4 HOURS: * IF OFFICE WILL BE OPEN: You need to be seen within the next 3 or 4 hours. Call your doctor (or NP/PA) now or as soon as the office opens. CARE ADVICE given per Weakness and Fatigue (Adult) guideline. * You become worse CALL BACK IF: Comments User: Rozelle Logan, RN Date/Time Eilene Ghazi Time): 03/12/2022 9:51:52 AM HR 79 User: Rozelle Logan, RN Date/Time Eilene Ghazi Time): 03/12/2022 9:55:05 AM oxygen 99% HR 65 Referrals GO TO FACILITY OTHER - SPECIFY

## 2022-03-17 ENCOUNTER — Encounter: Payer: Self-pay | Admitting: Family Medicine

## 2022-03-17 ENCOUNTER — Ambulatory Visit (INDEPENDENT_AMBULATORY_CARE_PROVIDER_SITE_OTHER): Payer: Medicare Other | Admitting: Family Medicine

## 2022-03-17 VITALS — BP 110/60 | HR 71 | Temp 98.9°F | Ht 65.0 in | Wt 142.6 lb

## 2022-03-17 DIAGNOSIS — I712 Thoracic aortic aneurysm, without rupture, unspecified: Secondary | ICD-10-CM | POA: Diagnosis not present

## 2022-03-17 DIAGNOSIS — J449 Chronic obstructive pulmonary disease, unspecified: Secondary | ICD-10-CM

## 2022-03-17 DIAGNOSIS — M818 Other osteoporosis without current pathological fracture: Secondary | ICD-10-CM | POA: Diagnosis not present

## 2022-03-17 DIAGNOSIS — E785 Hyperlipidemia, unspecified: Secondary | ICD-10-CM | POA: Diagnosis not present

## 2022-04-15 ENCOUNTER — Other Ambulatory Visit: Payer: Self-pay | Admitting: Family Medicine

## 2022-05-09 ENCOUNTER — Encounter: Payer: Self-pay | Admitting: Family Medicine

## 2022-05-09 ENCOUNTER — Ambulatory Visit (INDEPENDENT_AMBULATORY_CARE_PROVIDER_SITE_OTHER): Payer: Medicare Other | Admitting: Podiatry

## 2022-05-09 ENCOUNTER — Ambulatory Visit (INDEPENDENT_AMBULATORY_CARE_PROVIDER_SITE_OTHER): Payer: Medicare Other | Admitting: Family Medicine

## 2022-05-09 VITALS — BP 135/77 | HR 87 | Temp 98.7°F | Ht 65.0 in | Wt 141.6 lb

## 2022-05-09 DIAGNOSIS — M79672 Pain in left foot: Secondary | ICD-10-CM

## 2022-05-09 DIAGNOSIS — Q828 Other specified congenital malformations of skin: Secondary | ICD-10-CM | POA: Diagnosis not present

## 2022-05-09 DIAGNOSIS — M79675 Pain in left toe(s): Secondary | ICD-10-CM | POA: Diagnosis not present

## 2022-05-09 DIAGNOSIS — M79671 Pain in right foot: Secondary | ICD-10-CM | POA: Diagnosis not present

## 2022-05-09 DIAGNOSIS — M79674 Pain in right toe(s): Secondary | ICD-10-CM | POA: Diagnosis not present

## 2022-05-09 DIAGNOSIS — J449 Chronic obstructive pulmonary disease, unspecified: Secondary | ICD-10-CM

## 2022-05-09 DIAGNOSIS — R509 Fever, unspecified: Secondary | ICD-10-CM

## 2022-05-09 DIAGNOSIS — J302 Other seasonal allergic rhinitis: Secondary | ICD-10-CM

## 2022-05-09 DIAGNOSIS — J329 Chronic sinusitis, unspecified: Secondary | ICD-10-CM | POA: Diagnosis not present

## 2022-05-09 DIAGNOSIS — B351 Tinea unguium: Secondary | ICD-10-CM

## 2022-05-09 LAB — POCT INFLUENZA A/B

## 2022-05-09 LAB — POC COVID19 BINAXNOW

## 2022-05-09 MED ORDER — AZELASTINE HCL 0.1 % NA SOLN: 2.0000 | Freq: Two times a day (BID) | NASAL | 12 refills | Status: DC

## 2022-05-09 MED ORDER — AMOXICILLIN-POT CLAVULANATE 875-125 MG PO TABS: 1.0000 | ORAL_TABLET | Freq: Two times a day (BID) | ORAL | 0 refills | Status: DC

## 2022-05-09 NOTE — Progress Notes (Signed)
   Cindy Schwartz is a 88 y.o. female who presents today for an office visit.  Assessment/Plan:  New/Acute Problems: Sinusitis No red flags. Reassuring exam today and normal vital signs. COVID and flu tests were negative. Given length of symptoms we we will start course of Augmentin.  Also start Astelin.  It is possible that her generalized myalgias and chills could be part of a viral  Diarrhea Potentially component of above sinusitis or she may have a more acute viral syndrome contributing as well.  Vital signs were normal today without any signs of dehydration.  Encouraged hydration.  Can use Imodium as needed.  Anticipate this will resolve as she clears her infection.  We will check urine culture today per patient request though she is not having any other symptoms of UTI.  Chronic Problems Addressed Today: Allergic rhinitis  Potentially contributing to her above sinus infection.  She will continue using Flonase and will add on Astelin as above.  COPD Normal lung exam today.  No signs of COPD flare.     Subjective:  HPI:  Patient here with cough. Symptoms started a few weeks ago. She has also had some myalgias, joint pain, and chills. She has a lot of post nasal drip. Symptoms started getting worse significantly within the last few days and started having diarrhea at that time as well. No melena or hematochezia. Some nausea this morning. No vomiting. No chest pain or shortness of breath. No ear pain. No known sick contact       Objective:  Physical Exam: BP 135/77   Pulse 87   Temp 98.7 F (37.1 C) (Temporal)   Ht '5\' 5"'$  (1.651 m)   Wt 141 lb 9.6 oz (64.2 kg)   LMP  (LMP Unknown)   SpO2 97%   BMI 23.56 kg/m   Gen: No acute distress, resting comfortably HEENT: TMs with clear effusion.  OP erythematous.  Nose mucosa erythematous with clear discharge. CV: Regular rate and rhythm with no murmurs appreciated Pulm: Normal work of breathing, clear to auscultation bilaterally with no  crackles, wheezes, or rhonchi Neuro: Grossly normal, moves all extremities Psych: Normal affect and thought content      Elisheva Fallas M. Jerline Pain, MD 05/09/2022 1:39 PM

## 2022-05-09 NOTE — Patient Instructions (Signed)
It was very nice to see you today!  I think you may have a sinus infection.  Please start the Augmentin.  Start the nasal spray.  Please make sure that you are getting plenty of fluids.  Your COVID and flu test were both negative today.  Please let us know if not improving over the next several days.  Take care, Dr Jerline Pain  PLEASE NOTE:  If you had any lab tests please let us know if you have not heard back within a few days. You may see your results on mychart before we have a chance to review them but we will give you a call once they are reviewed by [place]. If we ordered any referrals today, please let us know if you have not heard from their office within the next week.   Please try these tips to maintain a healthy lifestyle:  Eat at least 3 REAL meals and 1-2 snacks per day.  Aim for no more than 5 hours between eating.  If you eat breakfast, please do so within one hour of getting up.   Each meal should contain half fruits/vegetables, one quarter protein, and one quarter carbs (no bigger than a computer mouse)  Cut down on sweet beverages. This includes juice, soda, and sweet tea.   Drink at least 1 glass of water with each meal and aim for at least 8 glasses per day  Exercise at least 150 minutes every week.

## 2022-05-11 LAB — URINE CULTURE
MICRO NUMBER:: 13800064
SPECIMEN QUALITY:: ADEQUATE

## 2022-05-12 ENCOUNTER — Telehealth: Payer: Self-pay | Admitting: Family Medicine

## 2022-05-12 DIAGNOSIS — K921 Melena: Secondary | ICD-10-CM

## 2022-05-12 DIAGNOSIS — R1030 Lower abdominal pain, unspecified: Secondary | ICD-10-CM

## 2022-05-12 NOTE — Telephone Encounter (Signed)
Patient requests to be called at ph# [phone] re: Patient states she still has headache, fatigue and diarrhea. Patient states she was seen at Office Visit on 05/09/22 for same.  Patient states since she started taking the medication that was prescribed on 05/09/22 her stool is very dark and would like to know if the medication causes that.  Patient states she does not want to be seen today by another Provider.

## 2022-05-13 NOTE — Telephone Encounter (Signed)
See below

## 2022-05-13 NOTE — Telephone Encounter (Signed)
Spoke with patient, stated feeling a bit better since the visit but after starting taking amoxicillin been having lower abdominal pain, loose and dark stool. Stated medication making her uncomfortable and may not continue taking it Please advise

## 2022-05-13 NOTE — Progress Notes (Signed)
Please inform patient of the following:  Urine culture inconclusive.  Would like for her to let us know if her symptoms have not improved.

## 2022-05-13 NOTE — Telephone Encounter (Signed)
Ok to stop and monitor symptoms off of medicine- if recurrent let me know. Dark stool concerns me- check CBC under melena to make sure no significant anemia. Check CMP under lower abdominal pain as well - evaluate kidney function- ideally Wednesday or Thursday at latest

## 2022-05-15 ENCOUNTER — Other Ambulatory Visit (INDEPENDENT_AMBULATORY_CARE_PROVIDER_SITE_OTHER): Payer: Medicare Other

## 2022-05-15 ENCOUNTER — Other Ambulatory Visit: Payer: Self-pay | Admitting: Family Medicine

## 2022-05-15 DIAGNOSIS — E871 Hypo-osmolality and hyponatremia: Secondary | ICD-10-CM

## 2022-05-15 DIAGNOSIS — K921 Melena: Secondary | ICD-10-CM

## 2022-05-15 DIAGNOSIS — R1030 Lower abdominal pain, unspecified: Secondary | ICD-10-CM

## 2022-05-15 LAB — CBC WITH DIFFERENTIAL/PLATELET
Basophils Absolute: 0 10*3/uL (ref 0.0–0.1)
Basophils Relative: 0.7 % (ref 0.0–3.0)
Eosinophils Absolute: 0.2 10*3/uL (ref 0.0–0.7)
Eosinophils Relative: 2.9 % (ref 0.0–5.0)
HCT: 36.8 % (ref 36.0–46.0)
Hemoglobin: 12.1 g/dL (ref 12.0–15.0)
Lymphocytes Relative: 29.3 % (ref 12.0–46.0)
Lymphs Abs: 2 10*3/uL (ref 0.7–4.0)
MCHC: 32.9 g/dL (ref 30.0–36.0)
MCV: 82.9 fl (ref 78.0–100.0)
Monocytes Absolute: 0.7 10*3/uL (ref 0.1–1.0)
Monocytes Relative: 10.5 % (ref 3.0–12.0)
Neutro Abs: 3.9 10*3/uL (ref 1.4–7.7)
Neutrophils Relative %: 56.6 % (ref 43.0–77.0)
Platelets: 260 10*3/uL (ref 150.0–400.0)
RBC: 4.44 Mil/uL (ref 3.87–5.11)
RDW: 14.4 % (ref 11.5–15.5)
WBC: 6.9 10*3/uL (ref 4.0–10.5)

## 2022-05-15 LAB — COMPREHENSIVE METABOLIC PANEL
ALT: 19 U/L (ref 0–35)
AST: 24 U/L (ref 0–37)
Albumin: 3.9 g/dL (ref 3.5–5.2)
Alkaline Phosphatase: 60 U/L (ref 39–117)
BUN: 14 mg/dL (ref 6–23)
CO2: 28 mEq/L (ref 19–32)
Calcium: 9.3 mg/dL (ref 8.4–10.5)
Chloride: 93 mEq/L — ABNORMAL LOW (ref 96–112)
Creatinine, Ser: 0.6 mg/dL (ref 0.40–1.20)
GFR: 81.28 mL/min (ref >60.00)
Glucose, Bld: 109 mg/dL — ABNORMAL HIGH (ref 70–99)
Potassium: 4.1 mEq/L (ref 3.5–5.1)
Sodium: 127 mEq/L — ABNORMAL LOW (ref 135–145)
Total Bilirubin: 0.4 mg/dL (ref 0.2–1.2)
Total Protein: 6.8 g/dL (ref 6.0–8.3)

## 2022-05-15 NOTE — Telephone Encounter (Signed)
Called and spoke with pt and below message given, pt will come today to get labs.

## 2022-05-16 ENCOUNTER — Telehealth: Payer: Self-pay | Admitting: Family Medicine

## 2022-05-16 DIAGNOSIS — R1030 Lower abdominal pain, unspecified: Secondary | ICD-10-CM

## 2022-05-16 NOTE — Telephone Encounter (Signed)
Also add urinalysis and urine culture to the requested labs then please-make sure lab knows to draw both the ones I ordered and the ones being ordered today

## 2022-05-16 NOTE — Telephone Encounter (Signed)
UA and culture added.

## 2022-05-16 NOTE — Telephone Encounter (Signed)
Pt requests call back for 08/24 lab tests.

## 2022-05-16 NOTE — Telephone Encounter (Signed)
Called and spoke with pt and labs reviewed, pt wanted you to be aware that she is still going to the bathroom multiple times a day.

## 2022-05-18 ENCOUNTER — Encounter: Payer: Self-pay | Admitting: Podiatry

## 2022-05-18 NOTE — Progress Notes (Signed)
  Subjective:  Patient ID: Cindy Schwartz, female    DOB: August 15, 1934,  MRN: [id number]  Cindy Schwartz presents to clinic today for painful porokeratotic lesion(s) b/l lower extremities and painful mycotic toenails that limit ambulation. Painful toenails interfere with ambulation. Aggravating factors include wearing enclosed shoe gear. Pain is relieved with periodic professional debridement. Painful porokeratotic lesions are aggravated when weightbearing with and without shoegear. Pain is relieved with periodic professional debridement.  PCP is Marin Olp, MD , and last visit was March 17, 2022.  Allergies  Allergen Reactions   Prednisone     Fatigue and depressed mood on 7 day '40mg'$  x 3 days, 20 mg x 4 days from Dr. Yong Channel- intolerance   Chlorhexidine Gluconate Rash    Review of Systems: Negative except as noted in the HPI.  Objective: No changes noted in today's physical examination.  Vascular Examination: CFT <3 seconds b/l LE. Palpable DP pulse(s) b/l LE. Palpable PT pulse(s) b/l LE. Pedal hair sparse. No pain with calf compression b/l. No edema noted b/l LE. Varicosities present b/l. No cyanosis or clubbing noted b/l LE.Marland Kitchen  Dermatological Examination: Pedal skin thin and atrophic b/l LE. No open wounds b/l LE. No interdigital macerations noted b/l LE. Toenails 1-5 b/l elongated, discolored, dystrophic, thickened, crumbly with subungual debris and tenderness to dorsal palpation. Incurvated nailplate both borders of left hallux and both borders of right hallux.  Nail border hypertrophy absent. There is tenderness to palpation. Sign(s) of infection: no clinical signs of infection noted on examination today. Porokeratotic lesion(s) submet head 2 b/l. No erythema, no edema, no drainage, no fluctuance.  Neurological Examination: Protective sensation intact with 10 gram monofilament b/l LE. Vibratory sensation intact b/l LE.   Musculoskeletal Examination: Muscle strength 5/5 to all LE muscle  groups b/l. Plantarflexed metatarsal(s) 2nd metatarsal head b/l lower extremities.  Assessment/Plan: 1. Pain due to onychomycosis of toenails of both feet   2. Porokeratosis   3. Pain in both feet   -Patient was evaluated and treated. All patient's and/or POA's questions/concerns answered on today's visit. -Medicare ABN signed. Patient consents for services of calluses  today. Copy has been placed in patient's chart. -Mycotic toenails 1-5 bilaterally were debrided in length and girth with sterile nail nippers and dremel without incident. -Offending nail border debrided and curretaged bilateral great toes utilizing sterile nail nipper and currette. Border cleansed with alcohol and triple antibiotic applied. No further treatment required by patient/caregiver. Call office if there are any concerns. -Patient/POA to call should there be question/concern in the interim.   Return in about 3 months (around 08/09/2022).  Marzetta Board, DPM

## 2022-05-20 ENCOUNTER — Other Ambulatory Visit (INDEPENDENT_AMBULATORY_CARE_PROVIDER_SITE_OTHER): Payer: Medicare Other

## 2022-05-20 ENCOUNTER — Ambulatory Visit: Payer: Medicare Other | Admitting: Family Medicine

## 2022-05-20 DIAGNOSIS — R1030 Lower abdominal pain, unspecified: Secondary | ICD-10-CM

## 2022-05-20 DIAGNOSIS — E871 Hypo-osmolality and hyponatremia: Secondary | ICD-10-CM | POA: Diagnosis not present

## 2022-05-20 LAB — COMPREHENSIVE METABOLIC PANEL
ALT: 20 U/L (ref 0–35)
AST: 26 U/L (ref 0–37)
Albumin: 4 g/dL (ref 3.5–5.2)
Alkaline Phosphatase: 58 U/L (ref 39–117)
BUN: 12 mg/dL (ref 6–23)
CO2: 27 mEq/L (ref 19–32)
Calcium: 9.5 mg/dL (ref 8.4–10.5)
Chloride: 98 mEq/L (ref 96–112)
Creatinine, Ser: 0.66 mg/dL (ref 0.40–1.20)
GFR: 79.43 mL/min (ref >60.00)
Glucose, Bld: 60 mg/dL — ABNORMAL LOW (ref 70–99)
Potassium: 3.8 mEq/L (ref 3.5–5.1)
Sodium: 133 mEq/L — ABNORMAL LOW (ref 135–145)
Total Bilirubin: 0.5 mg/dL (ref 0.2–1.2)
Total Protein: 7 g/dL (ref 6.0–8.3)

## 2022-05-21 NOTE — Telephone Encounter (Signed)
See result notes. 

## 2022-05-23 LAB — OSMOLALITY

## 2022-05-23 LAB — URINE CULTURE
MICRO NUMBER:: 13846831
SPECIMEN QUALITY:: ADEQUATE

## 2022-05-23 LAB — SODIUM, URINE, RANDOM: Sodium, Ur: 46 mmol/L (ref 28–272)

## 2022-05-23 LAB — OSMOLALITY, URINE: Osmolality, Ur: 252 mOsm/kg (ref 50–1200)

## 2022-05-27 ENCOUNTER — Other Ambulatory Visit: Payer: Self-pay

## 2022-05-27 DIAGNOSIS — E871 Hypo-osmolality and hyponatremia: Secondary | ICD-10-CM

## 2022-05-27 NOTE — Progress Notes (Signed)
Future cortisol ordered.

## 2022-05-28 ENCOUNTER — Other Ambulatory Visit (INDEPENDENT_AMBULATORY_CARE_PROVIDER_SITE_OTHER): Payer: Medicare Other

## 2022-05-28 DIAGNOSIS — E871 Hypo-osmolality and hyponatremia: Secondary | ICD-10-CM

## 2022-05-28 LAB — CORTISOL: Cortisol, Plasma: 10.7 ug/dL

## 2022-05-28 NOTE — Progress Notes (Signed)
I, [name], LAT, ATC acting as a scribe for [name], MD.  Subjective:    CC: Low back pain  HPI: Pt is an 88 y/o female c/o LBP ongoing since March. Pt's LBP was previously cared for by her PCP and she has completed PT at [facility] being d/c on 5/24. Today, pt reports LBP started flared back up around the end of May, worsening recently. Pt locates pain to both sides of her low back mostly to the posterior-lateral aspect of the R thigh, and bilat calves.   Radiating pain: yes LE numbness/tingling: no LE weakness: no Aggravates: forward trunk flexion Treatments tried: prior PT, IBU  Dx imaging: 01/08/22 L-spine XR  07/31/20 L-spine XR  10/21/18 L-spine XR  Pertinent review of Systems: No fevers or chills  Relevant historical information: History of stroke.   Objective:    Vitals:   05/29/22 0842  BP: 138/80  Pulse: 74  SpO2: 97%   General: Well Developed, well nourished, and in no acute distress.   MSK: L-spine: Nontender midline. Lumbar motion limited extension and flexion. Lower extremity strength intact except noted below. Reflexes are intact. Negative slump test.  Right hip: Normal-appearing Mildly tender palpation of the greater trochanter. Hip abduction and external rotation strength mildly diminished 4/5.  Lab and Radiology Results  EXAM: LUMBAR SPINE - COMPLETE 4+ VIEW   COMPARISON:  X-ray 07/31/2020.   FINDINGS: No recent fracture is seen. Decrease in height of bodies of T12 and L1 vertebrae has not changed. Alignment of posterior margins of vertebral bodies is unremarkable. Degenerative changes are noted in the lumbar spine more so at L2-L3 and L5-S1 levels. There is interval progression of degenerative changes. There is mild levoscoliosis. Surgical clips are seen in the mid abdomen.   IMPRESSION: No recent fracture is seen. Decrease in height of bodies of T12 and L1 vertebrae has not changed. Degenerative are noted in the  lumbar spine, more so at L2-L3 and L5-S1 levels with interval progression.     Electronically Signed   By: Elmer Picker M.D.   On: 01/10/2022 14:50 I, [name], personally (independently) visualized and performed the interpretation of the images attached in this note.     Impression and Recommendations:    Assessment and Plan: 88 y.o. female with chronic bilateral low back pain with right lateral hip pain and bilateral lateral calf pain.  This has been ongoing for over 6 months now and has continued despite a good trial of physical therapy and continued home exercise program. Pain is thought to be multifactorial.  Axial back pain thought to be related to degenerative changes seen on x-ray lumbar spine as well as paraspinal muscle spasm and dysfunction.  Lateral hip pain thought to be due to greater trochanteric bursitis/hip abductor tendinopathy.  Differential diagnosis does include L5 lumbar radiculopathy on the right.  Bilateral lateral calf pain thought to be due to bilateral L5 lumbar radiculopathy.  She is failing conservative management.  Plan for MRI lumbar spine and recheck in clinic following the MRI.  At that time we can determine future treatment plan and options including epidural steroid injection, facet injections, or even retrial of physical therapy.Marland Kitchen  PDMP not reviewed this encounter. Orders Placed This Encounter  Procedures   MR Lumbar Spine Wo Contrast    Standing Status:   Future    Standing Expiration Date:   05/30/2023    Order Specific Question:   What is the patient's sedation requirement?  Answer:   No Sedation    Order Specific Question:   Does the patient have a pacemaker or implanted devices?    Answer:   No    Order Specific Question:   Preferred imaging location?    Answer:   [facility] (table limit-550lbs)   No orders of the defined types were placed in this encounter.   Discussed warning signs or symptoms. Please see discharge  instructions. Patient expresses understanding.   The above documentation has been reviewed and is accurate and complete [name], M.D.

## 2022-05-28 NOTE — Telephone Encounter (Signed)
Called and spoke with pt and results reviewed. 

## 2022-05-29 ENCOUNTER — Ambulatory Visit (INDEPENDENT_AMBULATORY_CARE_PROVIDER_SITE_OTHER): Payer: Medicare Other | Admitting: Family Medicine

## 2022-05-29 ENCOUNTER — Telehealth: Payer: Self-pay | Admitting: Family Medicine

## 2022-05-29 VITALS — BP 138/80 | HR 74 | Ht 65.0 in | Wt 141.4 lb

## 2022-05-29 DIAGNOSIS — G8929 Other chronic pain: Secondary | ICD-10-CM | POA: Diagnosis not present

## 2022-05-29 DIAGNOSIS — M7061 Trochanteric bursitis, right hip: Secondary | ICD-10-CM

## 2022-05-29 DIAGNOSIS — M5441 Lumbago with sciatica, right side: Secondary | ICD-10-CM

## 2022-05-29 NOTE — Telephone Encounter (Signed)
Called and spoke with pt, results reviewed.

## 2022-05-29 NOTE — Telephone Encounter (Signed)
Pt states: -Returning phone call -is at home and will be available for the rest of the day.

## 2022-05-29 NOTE — Patient Instructions (Signed)
Thank you for coming in today.   You should hear from MRI scheduling within 1 week. If you do not hear please let me know.    Recheck after the MRI.   Plan for injections based on the MRI.

## 2022-06-16 ENCOUNTER — Ambulatory Visit
Admission: RE | Admit: 2022-06-16 | Discharge: 2022-06-16 | Disposition: A | Discharge status: Home / Self Care | Payer: Medicare Other | Source: Ambulatory Visit | Attending: Family Medicine | Admitting: Family Medicine

## 2022-06-16 DIAGNOSIS — G8929 Other chronic pain: Secondary | ICD-10-CM

## 2022-06-18 NOTE — Progress Notes (Signed)
Lumbar spine MRI shows multiple levels where nerves could be pinched and areas of arthritis that could cause back pain.  Recommend return to clinic to go over the results in full detail and discuss treatment plan and options including which injections we should consider.

## 2022-06-25 NOTE — Progress Notes (Unsigned)
   I, [name], LAT, ATC acting as a scribe for [name], MD.  Cindy Schwartz is a 88 y.o. female who presents to [place] [facility] today for f/u chronic bilat LBP w/ MRI review. She completed a prior course of PT at [facility], being d/c on 5/24. Pt was last seen by Dr. Georgina Snell on 05/29/22 and was advised to proceed to lumbar MRI. Today, pt reports  Dx imaging: 06/16/22 L-spine MRI 01/08/22 L-spine XR             07/31/20 L-spine XR             10/21/18 L-spine XR  Pertinent review of systems: ***  Relevant historical information: ***   Exam:  LMP  (LMP Unknown)  General: Well Developed, well nourished, and in no acute distress.   MSK: ***    Lab and Radiology Results No results found for this or any previous visit (from the past 72 hour(s)). No results found.     Assessment and Plan: 88 y.o. female with ***   PDMP not reviewed this encounter. No orders of the defined types were placed in this encounter.  No orders of the defined types were placed in this encounter.    Discussed warning signs or symptoms. Please see discharge instructions. Patient expresses understanding.   ***

## 2022-06-26 ENCOUNTER — Telehealth: Payer: Self-pay | Admitting: Family Medicine

## 2022-06-26 ENCOUNTER — Ambulatory Visit (INDEPENDENT_AMBULATORY_CARE_PROVIDER_SITE_OTHER): Payer: Medicare Other | Admitting: Family Medicine

## 2022-06-26 VITALS — BP 132/78 | Ht 65.0 in | Wt 142.0 lb

## 2022-06-26 DIAGNOSIS — M7061 Trochanteric bursitis, right hip: Secondary | ICD-10-CM

## 2022-06-26 DIAGNOSIS — G8929 Other chronic pain: Secondary | ICD-10-CM

## 2022-06-26 DIAGNOSIS — M5441 Lumbago with sciatica, right side: Secondary | ICD-10-CM

## 2022-06-26 NOTE — Patient Instructions (Addendum)
Thank you for coming in today.   I've referred you to Physical Therapy.  Let us know if you don't hear from them in one week.   Recheck in 6 weeks   If we need to we can do several back injections.   Let me know. A phone call or mychart message would be ok to start the back injection.   Epidural Steroid Injection  An epidural steroid injection is a shot of steroid medicine, also called cortisone, and a numbing medicine that is given into the epidural space. This space is between the spinal cord and the bones of the back. This shot helps relieve pain caused by an irritated or swollen nerve root. The amount of pain relief you get from the injection depends on what is causing the nerve to be swollen and irritated, and how long your pain lasts. You may have a period of slightly more pain after your injection, before the steroid medicine takes effect. This medicine usually starts working within 1-3 days. In some cases, you might need 7-10 days to feel the full effect. Tell your health care provider about: Any allergies you have. All medicines you are taking, including vitamins, herbs, eye drops, creams, and over-the-counter medicines. Any problems you or family members have had with anesthetic medicines. Any bleeding problems you have. Any surgeries you have had. Any medical conditions you have. Whether you are pregnant or may be pregnant. What are the risks? Your health care provider will talk with you about risks. These may include: Headache. Bleeding. Infection. Allergic reaction to medicines or dyes. Nerve damage. Not being able to move (paralysis). This is rare. What happens before the procedure? Medicines You may be given medicines to lower anxiety. Ask your health care provider about: Changing or stopping your regular medicines. These include any diabetes medicines or blood thinners you take. Taking medicines such as aspirin and ibuprofen. These medicines can thin your blood. Do  not take them unless your health care provider tells you to. Taking over-the-counter medicines, vitamins, herbs, and supplements. General instructions Follow instructions from your health care provider about what you may eat and drink. Ask your health care provider what steps will be taken to help prevent infection. If you will be going home right after the procedure, plan to have a responsible adult: Take you home from the hospital or clinic. You will not be allowed to drive. Care for you for the time you are told. What happens during the procedure?  An IV will be inserted into one of your veins. You may be given a sedative. This helps you relax. You will be asked to lie on your side or sit. The injection site will be cleaned. An X-ray machine will be used to guide the needle as close as possible to the nerve causing pain. A needle will be put through your skin into the epidural space. This may cause you some discomfort. Contrast dye may be injected at the site to make sure that the steroid medicine will be sent to the exact place it needs to go. The steroid medicine and a numbing medicine (local anesthesia) will be injected into the epidural space for pain relief. The needle and IV will be removed. A bandage (dressing) will be put over the injection site. The procedure may vary among health care providers and hospitals. What happens after the procedure? Your blood pressure, heart rate, breathing rate, and blood oxygen level will be monitored until you leave the hospital or clinic. Your  arm or leg may feel weak or numb for a few hours. Summary An epidural steroid injection is a shot of steroid medicine and a numbing medicine that is given into the epidural space. The shot helps relieve pain caused by an irritated or swollen nerve root. The steroid medicine usually starts working within 1-3 days. In some cases, you might need 7-10 days to feel the full effect. This information is not  intended to replace advice given to you by your health care provider. Make sure you discuss any questions you have with your health care provider. Document Revised: 12/31/2021 Document Reviewed: 12/31/2021 Elsevier Patient Education  [employer].

## 2022-06-26 NOTE — Telephone Encounter (Signed)
Please schedule f/u for this.

## 2022-06-26 NOTE — Telephone Encounter (Signed)
Pt was advised to go to ED.   Patient Name: Cindy Schwartz Gender: Female DOB: 1934/05/21 Age: 88 Y 33 M 9 D Return Phone Number: [phone] (Primary) Address: City/ State/ Zip: [place]  [address] Client [place] [facility] Client [facility] [place] [facility] [place] [facility] Provider Garret Reddish- MD Contact Type Call Who Is Calling Patient / Member / Family / Caregiver Call Type Triage / Clinical Relationship To Patient Self Return Phone Number [phone] (Primary) Chief Complaint SEVERE ABDOMINAL PAIN - Severe pain in abdomen Reason for Call Symptomatic / Request for [name] states she is having a lot of pain in her left side right underneath her ribs. Caller states she went out today with her friends and had lunch and had salmon and salad which is normal so not sure why she is having the pain. Caller states the pain is pretty bad and she does keep burping a lot and sometimes the pain radiates to her back Translation No Nurse Assessment Nurse: Lovena Le, RN, [name] Date/Time Eilene Ghazi Time): 06/25/2022 8:34:09 PM Confirm and document reason for call. If symptomatic, describe symptoms. ---Caller states she is having a lot of pain in her left side right underneath her ribs that is radiating to her back. She went out today for lunch (had salmon and a salad) at 1330 and pain started two hours later. No n/v or fever. Does the patient have any new or worsening symptoms? ---Yes Will a triage be completed? ---Yes Related visit to physician within the last 2 weeks? ---No Does the PT have any chronic conditions? (i.e. diabetes, asthma, this includes High risk factors for pregnancy, etc.) ---Yes List chronic conditions. ---high cholesterol Is this a behavioral health or substance abuse call? ---No Guidelines Guideline Title Affirmed Question Affirmed Notes Nurse Date/Time Eilene Ghazi Time) Abdominal  Pain - Upper [1] Pain lasts > 10 minutes AND [2] age > 58 [name] 06/25/2022 8:36:35 PM Disp. Time Eilene Ghazi Time) Disposition Final User 06/25/2022 8:32:55 PM Send to Urgent Pollie Friar, Denice 06/25/2022 8:40:59 PM Go to ED Now Yes Lovena Le, RN, [name] Final Disposition 06/25/2022 8:40:59 PM Go to ED Now Yes Lovena Le, RN, [name] Disagree/Comply Disagree Caller Understands Yes PreDisposition Did not know what to do Care Advice Given Per Guideline GO TO ED NOW: CARE ADVICE given per Abdominal Pain - Upper (Adult) guideline. Referrals [place] - ED

## 2022-07-07 ENCOUNTER — Ambulatory Visit (INDEPENDENT_AMBULATORY_CARE_PROVIDER_SITE_OTHER): Payer: Medicare Other | Admitting: Family Medicine

## 2022-07-07 ENCOUNTER — Ambulatory Visit: Payer: Self-pay

## 2022-07-07 ENCOUNTER — Encounter: Payer: Self-pay | Admitting: Physical Therapy

## 2022-07-07 ENCOUNTER — Ambulatory Visit: Payer: Medicare Other | Attending: Family Medicine | Admitting: Physical Therapy

## 2022-07-07 ENCOUNTER — Ambulatory Visit: Payer: Medicare Other | Admitting: Podiatry

## 2022-07-07 VITALS — BP 124/74 | HR 90 | Ht 65.0 in | Wt 142.0 lb

## 2022-07-07 DIAGNOSIS — M7061 Trochanteric bursitis, right hip: Secondary | ICD-10-CM | POA: Diagnosis not present

## 2022-07-07 DIAGNOSIS — M5459 Other low back pain: Secondary | ICD-10-CM

## 2022-07-07 DIAGNOSIS — R293 Abnormal posture: Secondary | ICD-10-CM | POA: Insufficient documentation

## 2022-07-07 DIAGNOSIS — G8929 Other chronic pain: Secondary | ICD-10-CM | POA: Insufficient documentation

## 2022-07-07 DIAGNOSIS — M5441 Lumbago with sciatica, right side: Secondary | ICD-10-CM | POA: Insufficient documentation

## 2022-07-07 DIAGNOSIS — M6281 Muscle weakness (generalized): Secondary | ICD-10-CM | POA: Diagnosis not present

## 2022-07-07 DIAGNOSIS — M62838 Other muscle spasm: Secondary | ICD-10-CM | POA: Diagnosis not present

## 2022-07-07 DIAGNOSIS — M79641 Pain in right hand: Secondary | ICD-10-CM | POA: Diagnosis not present

## 2022-07-07 MED ORDER — METHYLPREDNISOLONE ACETATE 80 MG/ML IJ SUSP
80.0000 mg | Freq: Once | INTRAMUSCULAR | Status: AC
  Administered 2022-07-07: 80 mg via INTRA_ARTICULAR

## 2022-07-07 NOTE — Progress Notes (Unsigned)
I, Cindy Schwartz, LAT, ATC acting as a scribe for Cindy Leader, MD.  Cindy Schwartz is a 88 y.o. female who presents to [facility] [place] [facility] today for right hand pain.  Patient was previously seen by Dr. Georgina Schwartz on 06/26/2022 for chronic low back pain.  She is right-hand dominant.  Patient was seen by Dr. Tempie Schwartz on 11/05/2021 for bilateral thumb CMC OA and was given a right thumb CMC C steroid injection.  Today, patient complains of right hand pain ongoing since December. Pt notes not getting any relief from prior 1st CMC injection. Patient locates pain to all around the R 1st [facility] joint w/ swelling present. Pt has an upcoming trip to [place] to visit family.   Grip strength: weakened Paresthesias: no Aggravates: any use w/ R hand, driving Treatments tried: Prior OT, bracing, topical creams prior steroid injections  Dx imaging: 09/26/2021 R & L hand x-rays  Pertinent review of systems: no fever or chills  Relevant historical information: Osteoporosis   Exam:  BP 124/74   Pulse 90   Ht '5\' 5"'$  (1.651 m)   Wt 142 lb (64.4 kg)   LMP  (LMP Unknown)   SpO2 98%   BMI 23.63 kg/m  General: Well Developed, well nourished, and in no acute distress.   MSK: Right hand bossing and swelling of first [place].  Tender palpation first [place].  Slight decreased range of motion.    Lab and Radiology Results  Procedure: Real-time Ultrasound Guided Injection of right first [facility] Device: Philips Affiniti 50G Images permanently stored and available for review in PACS Verbal informed consent obtained.  Discussed risks and benefits of procedure. Warned about infection, bleeding, hyperglycemia damage to structures among others. Patient expresses understanding and agreement Time-out conducted.   Noted no overlying erythema, induration, or other signs of local infection.   Skin prepped in a sterile fashion.   Local anesthesia: Topical Ethyl chloride.   With sterile technique and under real time  ultrasound guidance: 0.5 mL of 80 mg/mL Depo-Medrol solution and 0.5 mL of lidocaine injected into first [place]. Fluid seen entering the [facility] joint.   Completed without difficulty   Pain moderately resolved suggesting accurate placement of the medication.   Advised to call if fevers/chills, erythema, induration, drainage, or persistent bleeding.   Images permanently stored and available for review in the ultrasound unit.  Impression: Technically successful ultrasound guided injection.   X-ray images right hand obtained January 2023 at orthopedic office personally independently interpreted. 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      Assessment and Plan: 88 y.o. female with right first [place] DJD.  Plan for repeat steroid injection today.  Previous injection was in February 2023. She is already completed trial of hand therapy and tried different braces which were not effective.  Check back as needed.   PDMP not reviewed this encounter. Orders Placed This Encounter  Procedures   [place] LIMITED JOINT SPACE STRUCTURES UP RIGHT(NO LINKED CHARGES)    Order Specific Question:   Reason for Exam (SYMPTOM  OR DIAGNOSIS REQUIRED)    Answer:   Right hand pain    Order Specific Question:   Preferred imaging location?    Answer:   [place]   Meds ordered this encounter  Medications   methylPREDNISolone acetate (DEPO-MEDROL) injection 80 mg     Discussed warning signs or symptoms. Please see discharge instructions. Patient expresses understanding.   The above documentation has been reviewed and is accurate and  complete Cindy Schwartz, M.D.

## 2022-07-07 NOTE — Patient Instructions (Addendum)
Thank you for coming in today.   You received an injection today. Seek immediate medical attention if the joint becomes red, extremely painful, or is oozing fluid.   Have a great trip!  Check back as needed

## 2022-07-07 NOTE — Therapy (Signed)
OUTPATIENT PHYSICAL THERAPY THORACOLUMBAR EVALUATION   Patient Name: Cindy Schwartz MRN: [id number] DOB:1933-12-21, 88 y.o., female Today's Date: 07/07/2022   PT End of Session - 07/07/22 1036     Visit Number 1    Date for PT Re-Evaluation 09/29/22    Authorization Type [employer] medicare    PT Start Time 2694    PT Stop Time 1058    PT Time Calculation (min) 39 min    Activity Tolerance Patient tolerated treatment well    Behavior During Therapy WFL for tasks assessed/performed             Past Medical History:  Diagnosis Date   Aneurysm of ascending aorta ([place])    a. 4.2-4.3cm in 2014.   Anxiety    Arthritis    Breast cancer ([place]) 2018   right mastectomy   Cancer ([facility]) 1980   ovarian cancer   Compression fracture of L1 lumbar vertebra ([place])    COMPRESSION FRACTURE, THORACIC VERTEBRA 07/24/2010   Qualifier: Diagnosis of  By: [name], [name]    Elevated BP Transient 09/2014   Genetic testing 07/09/2017   Multi-Cancer panel (83 genes) @ [facility] - No pathogenic mutations detected   GERD (gastroesophageal reflux disease)    H/O bone density study    2015   H/O colonoscopy    History of ovarian cancer    November 1979   History of ovarian cancer 1979   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   Insomnia    Irregular heart beat    Normal coronary arteries Cath - 09/2014   Osteoporosis    Raynaud's disease    Past Surgical History:  Procedure Laterality Date   ABDOMINAL HYSTERECTOMY  1979   APPENDECTOMY     BILATERAL SALPINGOOPHORECTOMY     BREAST LUMPECTOMY     COLONOSCOPY  02/2012   LEFT HEART CATHETERIZATION WITH CORONARY ANGIOGRAM N/A 10/17/2014   Procedure: LEFT HEART CATHETERIZATION WITH CORONARY ANGIOGRAM;  Surgeon: [name], MD;  Location: [facility] C[facility] LAB;  Service: Cardiovascular;  Laterality: N/A;   ovarian ca s/p taj/bso  1979   SIMPLE MASTECTOMY WITH AXILLARY SENTINEL NODE BIOPSY Right 05/05/2017   Procedure: RIGHT TOTAL MASTECTOMY  WITH RIGHT  AXILLARY SENTINEL NODE BIOPSY;  Surgeon: [name], MD;  Location: [facility];  Service: General;  Laterality: Right;  PECTORAL BLOCK   Patient Active Problem List   Diagnosis Date Noted   COPD (chronic obstructive pulmonary disease) ([place]) 03/17/2022   Other seborrheic keratosis 02/04/2022   Melanocytic nevi of right ear and external auricular canal 11/04/2021   History of neoplasm 11/04/2021   Melanocytic nevi of scalp and neck 11/04/2021   Osteoarthritis of carpometacarpal (CMC) joints of both thumbs 09/26/2021   Pharyngoesophageal dysphagia 04/09/2021   Sinus pressure 04/09/2021   Pain due to onychomycosis of toenails of both feet 12/25/2020   History of cerebellar stroke 10/05/2020   Memory loss 10/05/2020   Arthritis of both knees 09/10/2020   Osteoarthritis of both hands 08/29/2020   Bilateral hand pain 08/29/2020   Bilateral carpal tunnel syndrome 08/29/2020   Trigger thumb of left hand 08/29/2020   History of adenomatous polyp of colon 12/30/2019   Mild hyperlipidemia 06/01/2019   Degenerative arthritis of knee, bilateral 04/08/2019   Chronic idiopathic constipation 03/01/2019   Chronic cough 01/21/2018   Genetic testing 07/09/2017   history of Breast cancer of lower-outer quadrant of right female breast  01/16/2016   Palpitations 11/13/2014   Normal coronary arteries 10/17/2014  Varicose veins of lower extremities with other complications 82/42/3536   Hx of herpes simplex infection 12/18/2011   Low back pain 06/28/2010   Lower abdominal pain 06/28/2010   Generalized anxiety disorder 10/13/2008   Irritable bowel syndrome 10/13/2008   Lung nodule 11/25/2007   Raynaud's syndrome 07/01/2007   Aneurysm of thoracic aorta ([place]) 05/06/2007   Allergic rhinitis 05/06/2007   Esophageal reflux 05/06/2007   DEGENERATIVE DISC DISEASE, CERVICAL SPINE 05/06/2007   Osteoporosis 03/18/2007   History of ovarian cancer 09/22/1977    PCP: Marin Olp, MD    REFERRING PROVIDER: Gregor Hams, MD  REFERRING DIAG: [phone] (ICD-10-CM) - Chronic bilateral low back pain with right-sided sciatica M70.61 (ICD-10-CM) - Trochanteric bursitis, right hip  Rationale for Evaluation and Treatment Rehabilitation  THERAPY DIAG:  Other low back pain  Muscle weakness (generalized)  Abnormal posture  ONSET DATE: January 2023  SUBJECTIVE:                                                                                                                                                                                           SUBJECTIVE STATEMENT: I am having pain in my Rt low back and it goes down to mid calf on the lateral side.  It is not excruciating pain but sometimes it stops me.  The Lt side now feels sore and stiff too. PERTINENT HISTORY:  Appendix removed, ovary remove, abdominal hysterectomy, mastectomy Rt side, history of cancer, widowed in 2021  PAIN:  Are you having pain? Yes NPRS scale: 9-10/10 (when the stabbing pain happens; but typically just feeling a nagging feeling) Pain location: low back bilaterally Pain orientation: Lower  PAIN TYPE: sharp or nagging Pain description: stabbing  Aggravating factors: cleaning the house like vacuum or leaning forward Relieving factors: heat   PRECAUTIONS: None  WEIGHT BEARING RESTRICTIONS: No  FALLS:  Has patient fallen in last 6 months? No  LIVING ENVIRONMENT: Lives with: lives alone Lives in: House/apartment   OCCUPATION: retired  PLOF: Independent  PATIENT GOALS: be able to keep the house and not get worse   OBJECTIVE:   DIAGNOSTIC FINDINGS:    PATIENT SURVEYS:  FOTO (did not do at eval)  SCREENING FOR RED FLAGS: Bowel or bladder incontinence: No very rarely leakage just when I have no time to go; nocturia I have to get up 3x Spinal tumors: No Cauda equina syndrome: No Compression fracture: No Abdominal aneurysm: No  COGNITION:  Overall cognitive status: Within  functional limits for tasks assessed     SENSATION: Not tested  MUSCLE LENGTH: Hamstrings: Right 70 deg; Left 70 deg Thomas test:  POSTURE: rounded shoulders and weight shift right - right shoulder rounded more forwards with scapular winging; Rt shoulder depressed and bends towards the right side with more tension in right lumbar paraspinals  PALPATION: In prone pt has tenderness to palpation L4/5 Tight lumbar paraspinals bilaterally  LUMBAR ROM:   AROM eval  Flexion WFL   Extension   Right lateral flexion   Left lateral flexion   Right rotation   Left rotation    (Blank rows = not tested)  LOWER EXTREMITY ROM:     Passive  Right eval Left eval  Hip flexion [facility]   Hip extension    Hip abduction    Hip adduction    Hip internal rotation 75% 75%  Hip external rotation [facility]   Knee flexion    Knee extension    Ankle dorsiflexion    Ankle plantarflexion    Ankle inversion    Ankle eversion     (Blank rows = not tested)  LOWER EXTREMITY MMT:    MMT (out of 5) Right eval Left eval  Hip flexion 5 5  Hip extension 4+ 4+  Hip abduction 4 4  Hip adduction 4 4  Hip internal rotation 5 5  Hip external rotation 5 5  Knee flexion    Knee extension    Ankle dorsiflexion    Ankle plantarflexion    Ankle inversion    Ankle eversion     (Blank rows = not tested)  LUMBAR SPECIAL TESTS:  Straight leg raise test: Negative and Slump test: Negative Active straight leg raise with pelvic compression - more pain in Rt hip  FUNCTIONAL TESTS:  Single leg standing - stable and no trendelenburg bilaterally  GAIT:  Comments: slightly flexed trunk    TODAY'S TREATMENT:  [facility] Adult PT Treatment:                                                                                                                            DATE: 07/07/22 Initial HEP and dry needling information provided and discussed    PATIENT EDUCATION:  Education details: Access Code:  [place] Person educated: Patient Education method: Explanation, Demonstration, Tactile cues, Verbal cues, and Handouts Education comprehension: verbalized understanding and returned demonstration   HOME EXERCISE PROGRAM: Access Code: [email or web address] URL: [email or web address] Date: 07/07/2022 Prepared by: Jari Favre  Exercises - Supine March  - 1 x daily - 7 x weekly - 3 sets - 10 reps  Patient Education - Trigger Point Dry Needling  ASSESSMENT:  CLINICAL IMPRESSION: Patient is a 88 y.o. female who was seen today for physical therapy evaluation and treatment for low back pain with radiculopathy.  Pt has muscle spasms in lumbar paraspinals.  She has scar tissue adhesion in abdomen and some difficulty activating the deep core transversus abdominus muscles.  Pt has hip weakness and decreased hip mobility as noted above.  She is tender to palpation L4/5.  Pt has pain standing  up after doing forward flexed position.  When asking pt to demonstrate how she lift objects, she demonstrates lumbar flexion and very little knee flexion putting all of the effort into lumbar paraspinals and lumbar spine.  Pt will benefit from skilled PT to address muscle spasms, weakness, and functional movement patterns for optimal function without pain.   OBJECTIVE IMPAIRMENTS: decreased activity tolerance, decreased coordination, decreased mobility, decreased ROM, decreased strength, increased muscle spasms, impaired flexibility, impaired tone, postural dysfunction, and pain.   ACTIVITY LIMITATIONS: lifting, bending, standing, and squatting  PARTICIPATION LIMITATIONS: meal prep, cleaning, laundry, and community activity  PERSONAL FACTORS: 3+ comorbidities: Appendix removed, ovary remove, abdominal hysterectomy, mastectomy Rt side, history of cancer, widowed in 2021  are also affecting patient's functional outcome.   REHAB POTENTIAL: Excellent  CLINICAL DECISION MAKING:  Stable/uncomplicated  EVALUATION COMPLEXITY: Low   GOALS: Goals reviewed with patient? Yes  SHORT TERM GOALS: Target date: 08/04/2022  Ind with basic core strengthening exercises Baseline: Goal status: INITIAL  2.  FOTO assessed at next visit Baseline:  Goal status: INITIAL  3.  Pt will report 20% less pain in gluteal and lateral bilateral legs. Baseline:  Goal status: INITIAL   LONG TERM GOALS: Target date: 09/29/2022  Pt will be independent with advanced HEP to maintain improvements made throughout therapy  Baseline:  Goal status: INITIAL  2.  Pt will report 30% reduction of pain due to improvements in posture, strength, and muscle length  Baseline:  Goal status: INITIAL  3.  Pt will be able to vacuum for 15-20 minutes at a time without referred pain Baseline:  Goal status: INITIAL  4.  Pt will demonstrate 5/5 hip strength for improved ability to do functional lift without bending forward Baseline:  Goal status: INITIAL     PLAN: PT FREQUENCY: 1-2x/week  PT DURATION: 12 weeks  PLANNED INTERVENTIONS: Therapeutic exercises, Therapeutic activity, Neuromuscular re-education, Balance training, Gait training, Patient/Family education, Self Care, Joint mobilization, Aquatic Therapy, Dry Needling, Electrical stimulation, Spinal manipulation, Spinal mobilization, Cryotherapy, Moist heat, scar mobilization, Taping, Traction, Biofeedback, Manual therapy, and Re-evaluation.  PLAN FOR NEXT SESSION: FOTO; core strength; dry needling or STM to lumbar; lumbar traction possibly   Camillo Flaming Ellice Boultinghouse, PT 07/07/2022, 12:26 PM

## 2022-07-09 ENCOUNTER — Ambulatory Visit (INDEPENDENT_AMBULATORY_CARE_PROVIDER_SITE_OTHER): Payer: Medicare Other | Admitting: Family Medicine

## 2022-07-09 ENCOUNTER — Encounter: Payer: Self-pay | Admitting: Family Medicine

## 2022-07-09 VITALS — BP 128/76 | HR 61 | Temp 97.2°F | Ht 65.0 in | Wt 138.4 lb

## 2022-07-09 DIAGNOSIS — E785 Hyperlipidemia, unspecified: Secondary | ICD-10-CM | POA: Diagnosis not present

## 2022-07-09 DIAGNOSIS — Z23 Encounter for immunization: Secondary | ICD-10-CM | POA: Diagnosis not present

## 2022-07-09 DIAGNOSIS — Z0000 Encounter for general adult medical examination without abnormal findings: Secondary | ICD-10-CM

## 2022-07-09 DIAGNOSIS — Z79899 Other long term (current) drug therapy: Secondary | ICD-10-CM | POA: Diagnosis not present

## 2022-07-09 DIAGNOSIS — K219 Gastro-esophageal reflux disease without esophagitis: Secondary | ICD-10-CM | POA: Diagnosis not present

## 2022-07-09 LAB — CBC WITH DIFFERENTIAL/PLATELET
Basophils Absolute: 0 10*3/uL (ref 0.0–0.1)
Basophils Relative: 0.5 % (ref 0.0–3.0)
Eosinophils Absolute: 0.2 10*3/uL (ref 0.0–0.7)
Eosinophils Relative: 2.3 % (ref 0.0–5.0)
HCT: 39.3 % (ref 36.0–46.0)
Hemoglobin: 12.8 g/dL (ref 12.0–15.0)
Lymphocytes Relative: 24 % (ref 12.0–46.0)
Lymphs Abs: 1.8 10*3/uL (ref 0.7–4.0)
MCHC: 32.7 g/dL (ref 30.0–36.0)
MCV: 83.8 fl (ref 78.0–100.0)
Monocytes Absolute: 0.6 10*3/uL (ref 0.1–1.0)
Monocytes Relative: 8.6 % (ref 3.0–12.0)
Neutro Abs: 4.9 10*3/uL (ref 1.4–7.7)
Neutrophils Relative %: 64.6 % (ref 43.0–77.0)
Platelets: 260 10*3/uL (ref 150.0–400.0)
RBC: 4.68 Mil/uL (ref 3.87–5.11)
RDW: 15.5 % (ref 11.5–15.5)
WBC: 7.5 10*3/uL (ref 4.0–10.5)

## 2022-07-09 LAB — COMPREHENSIVE METABOLIC PANEL
ALT: 16 U/L (ref 0–35)
AST: 21 U/L (ref 0–37)
Albumin: 4.2 g/dL (ref 3.5–5.2)
Alkaline Phosphatase: 71 U/L (ref 39–117)
BUN: 13 mg/dL (ref 6–23)
CO2: 30 mEq/L (ref 19–32)
Calcium: 9.9 mg/dL (ref 8.4–10.5)
Chloride: 97 mEq/L (ref 96–112)
Creatinine, Ser: 0.65 mg/dL (ref 0.40–1.20)
GFR: 79.64 mL/min (ref >60.00)
Glucose, Bld: 88 mg/dL (ref 70–99)
Potassium: 4.4 mEq/L (ref 3.5–5.1)
Sodium: 133 mEq/L — ABNORMAL LOW (ref 135–145)
Total Bilirubin: 0.5 mg/dL (ref 0.2–1.2)
Total Protein: 7.3 g/dL (ref 6.0–8.3)

## 2022-07-09 LAB — LIPID PANEL
Cholesterol: 129 mg/dL (ref 0–200)
HDL: 65.2 mg/dL (ref >39.00)
LDL Cholesterol: 57 mg/dL (ref 0–99)
NonHDL: 63.3
Total CHOL/HDL Ratio: 2
Triglycerides: 34 mg/dL (ref 0.0–149.0)
VLDL: 6.8 mg/dL (ref 0.0–40.0)

## 2022-07-09 LAB — VITAMIN B12: Vitamin B-12: 647 pg/mL (ref 211–911)

## 2022-07-09 MED ORDER — ROSUVASTATIN CALCIUM 5 MG PO TABS: 5.0000 mg | ORAL_TABLET | Freq: Every day | ORAL | 3 refills | Status: DC

## 2022-07-09 MED ORDER — ESOMEPRAZOLE MAGNESIUM 20 MG PO CPDR: 20.0000 mg | DELAYED_RELEASE_CAPSULE | Freq: Every day | ORAL | 3 refills | Status: DC

## 2022-07-09 MED ORDER — VALACYCLOVIR HCL 500 MG PO TABS: 500.0000 mg | ORAL_TABLET | ORAL | 3 refills | Status: DC

## 2022-07-09 MED ORDER — ASPIRIN 81 MG PO TBEC: DELAYED_RELEASE_TABLET | ORAL | 3 refills | Status: DC

## 2022-07-09 NOTE — Patient Instructions (Addendum)
Please stop by lab before you go If you have mychart- we will send your results within 3 business days of [place] receiving them.  If you do not have mychart- we will call you about results within 5 business days of [place] receiving them.  *please also note that you will see labs on mychart as soon as they post. I will later go in and write notes on them- will say "notes from Dr. Yong Channel"   No changes today  Recommended follow up: Return in about 6 months (around 01/08/2023) for followup or sooner if needed.Schedule b4 you leave.

## 2022-07-09 NOTE — Progress Notes (Signed)
Phone [phone]   Subjective:  Patient presents today for their annual physical. Chief complaint-noted.   See problem oriented charting- ROS- full  review of systems was completed and negative except for: joint pain, some burning, redness on cheek- worse with hydrocortisone that was recommended  The following were reviewed and entered/updated in epic: Past Medical History:  Diagnosis Date   Aneurysm of ascending aorta ([place])    a. 4.2-4.3cm in 2014.   Anxiety    Arthritis    Breast cancer ([place]) 2018   right mastectomy   Cancer ([place]) 1980   ovarian cancer   Compression fracture of L1 lumbar vertebra ([place])    COMPRESSION FRACTURE, THORACIC VERTEBRA 07/24/2010   Qualifier: Diagnosis of  By: [name], [name]    Elevated BP Transient 09/2014   Genetic testing 07/09/2017   Multi-Cancer panel (83 genes) @ [facility] - No pathogenic mutations detected   GERD (gastroesophageal reflux disease)    H/O bone density study    2015   H/O colonoscopy    History of ovarian cancer    November 1979   History of ovarian cancer 1979   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   Insomnia    Irregular heart beat    Normal coronary arteries Cath - 09/2014   Osteoporosis    Raynaud's disease    Patient Active Problem List   Diagnosis Date Noted   History of cerebellar stroke 10/05/2020    Priority: High   Memory loss 10/05/2020    Priority: High   history of Breast cancer of lower-outer quadrant of right female breast  01/16/2016    Priority: High   Lower abdominal pain 06/28/2010    Priority: High   Aneurysm of thoracic aorta ([place]) 05/06/2007    Priority: High   History of ovarian cancer 09/22/1977    Priority: High   COPD (chronic obstructive pulmonary disease) ([place]) 03/17/2022    Priority: Medium    History of adenomatous polyp of colon 12/30/2019    Priority: Medium    Mild hyperlipidemia 06/01/2019    Priority: Medium    Chronic cough 01/21/2018     Priority: Medium    Normal coronary arteries 10/17/2014    Priority: Medium    Hx of herpes simplex infection 12/18/2011    Priority: Medium    Esophageal reflux 05/06/2007    Priority: Medium    Osteoporosis 03/18/2007    Priority: Medium    Pain due to onychomycosis of toenails of both feet 12/25/2020    Priority: Low   Chronic idiopathic constipation 03/01/2019    Priority: Low   Genetic testing 07/09/2017    Priority: Low   Palpitations 11/13/2014    Priority: Low   Varicose veins of lower extremities with other complications 08/67/6195    Priority: Low   Generalized anxiety disorder 10/13/2008    Priority: Low   Irritable bowel syndrome 10/13/2008    Priority: Low   Lung nodule 11/25/2007    Priority: Low   Raynaud's syndrome 07/01/2007    Priority: Low   Allergic rhinitis 05/06/2007    Priority: Low   Osteoarthritis of carpometacarpal (CMC) joints of both thumbs 09/26/2021    Priority: 1.   Osteoarthritis of both hands 08/29/2020    Priority: 1.   Bilateral carpal tunnel syndrome 08/29/2020    Priority: 1.   Trigger thumb of left hand 08/29/2020    Priority: 1.   Degenerative arthritis of knee, bilateral 04/08/2019    Priority: 1.  Low back pain 06/28/2010    Priority: 1.   DEGENERATIVE DISC DISEASE, CERVICAL SPINE 05/06/2007    Priority: 1.   Past Surgical History:  Procedure Laterality Date   ABDOMINAL HYSTERECTOMY  1979   APPENDECTOMY     BILATERAL SALPINGOOPHORECTOMY     BREAST LUMPECTOMY     COLONOSCOPY  02/2012   LEFT HEART CATHETERIZATION WITH CORONARY ANGIOGRAM N/A 10/17/2014   Procedure: LEFT HEART CATHETERIZATION WITH CORONARY ANGIOGRAM;  Surgeon: [name], MD;  Location[facility] CATH LAB;  Service: Cardiovascular;  Laterality: N/A;   ovarian ca s/p taj/bso  1979   SIMPLE MASTECTOMY WITH AXILLARY SENTINEL NODE BIOPSY Right 05/05/2017   Procedure: RIGHT TOTAL MASTECTOMY WITH RIGHT  AXILLARY SENTINEL NODE BIOPSY;  Surgeon: [name], MD;   Location: [place];  Service: General;  Laterality: Right;  PECTORAL BLOCK    Family History  Problem Relation Age of Onset   Hip fracture Mother    Other Father        lived to 43   Healthy Brother    Colon polyps Brother    Breast cancer Cousin 21       mat first cousin related through uncle   Esophageal cancer Cousin 46       mat first cousin related through uncle    Medications- reviewed and updated Current Outpatient Medications  Medication Sig Dispense Refill   Ascorbic Acid (VITAMIN C) 1000 MG tablet Take 1,000 mg by mouth daily.     ASPIRIN LOW DOSE 81 MG tablet TAKE 1 TABLET DAILY (SWALLOW WHOLE) 90 tablet 3   azelastine (ASTELIN) 0.1 % nasal spray Place 2 sprays into both nostrils 2 (two) times daily. 30 mL 12   Cholecalciferol 25 MCG (1000 UT) capsule Take by mouth.     COLLAGEN PO Take by mouth.     esomeprazole (NEXIUM) 20 MG capsule TAKE 1 CAPSULE ONCE DAILY 90 capsule 3   fluticasone (FLONASE) 50 MCG/ACT nasal spray SHAKE LIQUID AND USE 2 SPRAYS IN EACH NOSTRIL DAILY 48 g 2   hydrocortisone (ANUSOL-HC) 2.5 % rectal cream Apply topically 2 (two) times daily.     MAGNESIUM PO Take 300 mg by mouth.     rosuvastatin (CRESTOR) 5 MG tablet Take 1 tablet (5 mg total) by mouth daily. Take 1 tablet daily by mouth. 90 tablet 3   valACYclovir (VALTREX) 500 MG tablet Take by mouth.     No current facility-administered medications for this visit.    Allergies-reviewed and updated Allergies  Allergen Reactions   Prednisone     Fatigue and depressed mood on 7 day '40mg'$  x 3 days, 20 mg x 4 days from Dr. Yong Channel- intolerance   Chlorhexidine Gluconate Rash    Social History   Social History Narrative   Widowed aug 2021. She and her husband  from the [place] of [place]. Children: Tamirah George, lives in [place], [place], lives in [place], [place]. 4 grandkids. No greatgrandkids.       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    Objective  Objective:  BP 128/76   Pulse 61   Temp (!) 97.2 F (36.2 C)   Ht '5\' 5"'$  (1.651 m)   Wt 138 lb 6.4 oz (62.8 kg)   LMP  (LMP Unknown)   SpO2 98%   BMI 23.03 kg/m  Gen: NAD, resting comfortably HEENT:  Mucous membranes are moist. Oropharynx normal Neck: no thyromegaly CV: RRR no murmurs rubs or gallops Lungs: CTAB no crackles, wheeze, rhonchi Abdomen: soft/nontender/nondistended/normal bowel sounds. No rebound or guarding.  Ext: no edema Skin: warm, dry Neuro: grossly normal, moves all extremities, PERRLA   Assessment and Plan   88 y.o. female presenting for annual physical.  Health Maintenance counseling: 1. Anticipatory guidance: Patient counseled regarding regular dental exams -q6 months, eye exams - yearly does deal with some dry eye,  avoiding smoking and second hand smoke , limiting alcohol to 1 beverage per day-doesn't drink , no illicit drugs .   2. Risk factor reduction:  Advised patient of need for regular exercise and diet rich and fruits and vegetables to reduce risk of heart attack and stroke.  Exercise- walking some and some stretching- helps keep her back in better shape.  Diet/weight management-within 3 lbs of last year- tries to eat reasonably healthy- sensitive stomach.  Wt Readings from Last 3 Encounters:  07/09/22 138 lb 6.4 oz (62.8 kg)  07/07/22 142 lb (64.4 kg)  06/26/22 142 lb (64.4 kg)  3. Immunizations/screenings/ancillary studies- flu shot today, shingrix - wants to hold off, , wants to hold off on covid,  RSV wants to hold off Immunization History  Administered Date(s) Administered   Fluad Quad(high Dose 65+) 06/01/2019, 06/11/2020, 06/26/2021, 07/09/2022   Influenza Split 06/26/2011, 07/07/2012   Influenza Whole 07/09/1999, 07/01/2007, 06/27/2008, 07/11/2009, 06/28/2010   Influenza, High Dose Seasonal PF 07/13/2013, 06/26/2015, 07/15/2017, 06/21/2018   Influenza,inj,Quad PF,6+ Mos 06/22/2014    Influenza,inj,quad, With Preservative 06/01/2019   Influenza-Unspecified 08/15/2016   PFIZER(Purple Top)SARS-COV-2 Vaccination 10/14/2019, 11/05/2019   Pneumococcal Polysaccharide-23 12/20/2012, 11/04/2018   Td 09/23/2003   Tdap 06/04/2020   Zoster, Live 01/19/2013   4. Cervical cancer screening- last completed in 2010-past age based screening recommendations.  No vaginal discharge or bleeding. No GYN. History of ovarian cancer 5. Breast cancer screening-his breast cancer in 2018 on the right-follows with Dr. [name] for breast exams yearly and most recent mammogram 02/10/2022 6. Colon cancer screening - March 2021 Dr. Salvatore [facility]- no repeat EGD or colonoscopy per his notes.  Later seen by Dr. Fuller Plan 7. Skin cancer screening-sees Dr. Delman Cheadle- will see in December. advised regular sunscreen use. Denies worrisome, changing, or new skin lesions- working with her on cheek issues 8. Birth control/STD check-widowed/not sexually active 9. Osteoporosis screening at 41- see below 10. Smoking associated screening -never smoker  Status of chronic or acute concerns   #History of cerebellar stroke with some balance and memory issues/hyperlipidemia S: Patient complained of issues with memory loss-on MRI of brain 09/17/2020-"Remote right cerebellar infarct."  We started aspirin 81 mg as well as rosuvastatin 5 mg daily with LDL goal less than 70  Memory and balance issues could be associated with prior stroke Lab Results  Component Value Date   CHOL 146 06/26/2021   HDL 67.60 06/26/2021   LDLCALC 69 06/26/2021   LDLDIRECT 66.0 01/08/2022   TRIG 48.0 06/26/2021   CHOLHDL 2 06/26/2021    A/P: Lipids have been at goal but due for repeat-update today.  Continue aspirin with stroke history  #% Aneurysm of thoracic aorta S: Gets MRI/MRI with Dr. [name] every other year-most recently Feb 04 2021.  Blood pressure goal less than 130/80 -Patient with normal coronary arteries in the past but still follows  with Dr. Johnsie Cancel every 6 months- palpitations with PVCs/PACs in the past A/P: Not quite due for repeat-continue to monitor-blood pressure controlled  on repeat   #% Esophageal reflux/history of chronic cough- has seen Dr. Chase Caller in the past S: Compliant with Nexium '20mg'$  every  day plus pepcid per Dr. Fuller Plan in 2023- is not needing- tums only A/P: Overall stable/improved-continue current medication   #Osteoporosis S: Patient on calcium and vitamin D.  Took Fosamax for years.  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  DEXA October 2018 showed worst T score at -3.0 lumbar spine. On different machine in 07/2019 worst t score - 2.2 RFN and LFN. Worsened again 08/08/21.  -2020 declined Prolia and again 2022 and 2023 -Nexium not ideal for osteoporosis  A/P: worsening but declines medicine- continue to monitor- recheck bone densitity next year  #CMC arthritis- recently with injection with Dr. Georgina Snell- could raise sugar some.   % History of ovarian cancer at age 89- infiltrating well differentiated papillary serous cystadenoma   #raynauds syndrome for years- slightly worse 2022 and 2023  #hyponatremia- workup reassuring- cortisol normal and dont have acth capacity- have opted further workup only if worsening.   #COPD on cxr but asympotmatic- never smoker. Some cough from PND - for PND- just on astelin- helps some- not taking flonase right now and wants to hold off  #HSV suppression- down to once or twice a week- requests refill on vlatrex- given today  Recommended follow up: Return in about 6 months (around 01/08/2023) for followup or sooner if needed.Schedule b4 you leave. Future Appointments  Date Time Provider [place]  07/25/2022  9:30 AM Lynden Ang, PT [facility] None  07/29/2022  9:00 AM Zehr, Laban Emperor, PA-C [facility]  08/01/2022  9:30 AM Lynden Ang, PT [facility] None  08/06/2022  9:30 AM [name], MD [facility]  08/07/2022  9:30 AM Gregor Hams, MD [facility] None  08/08/2022  9:30 AM Lynden Ang, PT [facility] None  08/22/2022  9:30 AM Candyce Churn B, PT [facility] None  09/30/2022  8:00 AM [facility] HEALTH COACH [facility]  10/20/2022  9:00 AM Marzetta Board, DPM [facility]   Lab/Order associations: fasting   ICD-10-CM   1. Preventative health care  Z00.00     2. Mild hyperlipidemia  E78.5     3. High risk medication use  Z79.899     4. Gastroesophageal reflux disease without esophagitis  K21.9     5. Need for immunization against influenza  Z23 Flu Vaccine QUAD High Dose(Fluad)     No orders of the defined types were placed in this encounter.   Return precautions advised.  Garret Reddish, MD

## 2022-07-16 ENCOUNTER — Ambulatory Visit: Payer: Medicare Other | Admitting: Cardiovascular Disease

## 2022-07-24 ENCOUNTER — Ambulatory Visit: Payer: Medicare Other | Admitting: Rehabilitative and Restorative Service Providers"

## 2022-07-24 NOTE — Therapy (Signed)
OUTPATIENT PHYSICAL THERAPY THORACOLUMBAR THERAPY VISIT   Patient Name: Cindy Schwartz MRN: [id number] DOB:Apr 06, 1934, 88 y.o., female Today's Date: 07/25/2022   PT End of Session - 07/25/22 1007     Visit Number 2    Date for PT Re-Evaluation 09/29/22    Authorization Type [employer]HC medicare    PT Start Time [phone]    PT Stop Time 1005    PT Time Calculation (min) 40 min    Activity Tolerance Patient tolerated treatment well    Behavior During Therapy WFL for tasks assessed/performed              Past Medical History:  Diagnosis Date   Aneurysm of ascending aorta ([place])    a. 4.2-4.3cm in 2014.   Anxiety    Arthritis    Breast cancer ([place]) 2018   right mastectomy   Cancer ([place]) 1980   ovarian cancer   Compression fracture of L1 lumbar vertebra ([place])    COMPRESSION FRACTURE, THORACIC VERTEBRA 07/24/2010   Qualifier: Diagnosis of  By: [name], [name]    Elevated BP Transient 09/2014   Genetic testing 07/09/2017   Multi-Cancer panel (83 genes) @ [facility] - No pathogenic mutations detected   GERD (gastroesophageal reflux disease)    H/O bone density study    2015   H/O colonoscopy    History of ovarian cancer    November 1979   History of ovarian cancer 1979   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   Insomnia    Irregular heart beat    Normal coronary arteries Cath - 09/2014   Osteoporosis    Raynaud's disease    Past Surgical History:  Procedure Laterality Date   ABDOMINAL HYSTERECTOMY  1979   APPENDECTOMY     BILATERAL SALPINGOOPHORECTOMY     BREAST LUMPECTOMY     COLONOSCOPY  02/2012   LEFT HEART CATHETERIZATION WITH CORONARY ANGIOGRAM N/A 10/17/2014   Procedure: LEFT HEART CATHETERIZATION WITH CORONARY ANGIOGRAM;  Surgeon: [name], MD;  Location: [facility] CATH LAB;  Service: Cardiovascular;  Laterality: N/A;   ovarian ca s/p taj/bso  1979   SIMPLE MASTECTOMY WITH AXILLARY SENTINEL NODE BIOPSY Right 05/05/2017   Procedure: RIGHT TOTAL  MASTECTOMY WITH RIGHT  AXILLARY SENTINEL NODE BIOPSY;  Surgeon: [name], MD;  Location: [place];  Service: General;  Laterality: Right;  PECTORAL BLOCK   Patient Active Problem List   Diagnosis Date Noted   COPD (chronic obstructive pulmonary disease) ([place]) 03/17/2022   Osteoarthritis of carpometacarpal (CMC) joints of both thumbs 09/26/2021   Pain due to onychomycosis of toenails of both feet 12/25/2020   History of cerebellar stroke 10/05/2020   Memory loss 10/05/2020   Osteoarthritis of both hands 08/29/2020   Bilateral carpal tunnel syndrome 08/29/2020   Trigger thumb of left hand 08/29/2020   History of adenomatous polyp of colon 12/30/2019   Mild hyperlipidemia 06/01/2019   Degenerative arthritis of knee, bilateral 04/08/2019   Chronic idiopathic constipation 03/01/2019   Chronic cough 01/21/2018   Genetic testing 07/09/2017   history of Breast cancer of lower-outer quadrant of right female breast  01/16/2016   Palpitations 11/13/2014   Normal coronary arteries 10/17/2014   Varicose veins of lower extremities with other complications 67/89/3810   Hx of herpes simplex infection 12/18/2011   Low back pain 06/28/2010   Lower abdominal pain 06/28/2010   Generalized anxiety disorder 10/13/2008   Irritable bowel syndrome 10/13/2008   Lung nodule 11/25/2007   Raynaud's syndrome 07/01/2007  Aneurysm of thoracic aorta ([place]) 05/06/2007   Allergic rhinitis 05/06/2007   Esophageal reflux 05/06/2007   DEGENERATIVE DISC DISEASE, CERVICAL SPINE 05/06/2007   Osteoporosis 03/18/2007   History of ovarian cancer 09/22/1977    PCP: Marin Olp, MD   REFERRING PROVIDER: Gregor Hams, MD  REFERRING DIAG: [phone] (ICD-10-CM) - Chronic bilateral low back pain with right-sided sciatica M70.61 (ICD-10-CM) - Trochanteric bursitis, right hip  Rationale for Evaluation and Treatment Rehabilitation  THERAPY DIAG:  Other low back pain  Muscle weakness  (generalized)  Abnormal posture  ONSET DATE: January 2023  SUBJECTIVE:                                                                                                                                                                                           SUBJECTIVE STATEMENT: Pt states that she is feeling a bit better since performing the stretches, although the back pain is always present.  PERTINENT HISTORY:  Appendix removed, ovary remove, abdominal hysterectomy, mastectomy Rt side, history of cancer, widowed in 2021  PAIN:  Are you having pain? Yes NPRS scale: 9-10/10 (when the stabbing pain happens; but typically just feeling a nagging feeling) Pain location: low back bilaterally Pain orientation: Lower  PAIN TYPE: sharp or nagging Pain description: stabbing  Aggravating factors: cleaning the house like vacuum or leaning forward Relieving factors: heat   PRECAUTIONS: None  WEIGHT BEARING RESTRICTIONS: No  FALLS:  Has patient fallen in last 6 months? No  LIVING ENVIRONMENT: Lives with: lives alone Lives in: House/apartment   OCCUPATION: retired  PLOF: Independent  PATIENT GOALS: be able to keep the house and not get worse   OBJECTIVE:   DIAGNOSTIC FINDINGS:    PATIENT SURVEYS:  FOTO (did not do at eval)  SCREENING FOR RED FLAGS: Bowel or bladder incontinence: No very rarely leakage just when I have no time to go; nocturia I have to get up 3x Spinal tumors: No Cauda equina syndrome: No Compression fracture: No Abdominal aneurysm: No  COGNITION:  Overall cognitive status: Within functional limits for tasks assessed     SENSATION: Not tested  MUSCLE LENGTH: Hamstrings: Right 70 deg; Left 70 deg Thomas test:   POSTURE: rounded shoulders and weight shift right - right shoulder rounded more forwards with scapular winging; Rt shoulder depressed and bends towards the right side with more tension in right lumbar paraspinals  PALPATION: In prone pt  has tenderness to palpation L4/5 Tight lumbar paraspinals bilaterally  LUMBAR ROM:   AROM eval  Flexion WFL   Extension   Right lateral flexion   Left lateral flexion   Right rotation  Left rotation    (Blank rows = not tested)  LOWER EXTREMITY ROM:     Passive  Right eval Left eval  Hip flexion [facility]   Hip extension    Hip abduction    Hip adduction    Hip internal rotation 75% 75%  Hip external rotation [facility]  [facility]   Knee flexion    Knee extension    Ankle dorsiflexion    Ankle plantarflexion    Ankle inversion    Ankle eversion     (Blank rows = not tested)  LOWER EXTREMITY MMT:    MMT (out of 5) Right eval Left eval  Hip flexion 5 5  Hip extension 4+ 4+  Hip abduction 4 4  Hip adduction 4 4  Hip internal rotation 5 5  Hip external rotation 5 5  Knee flexion    Knee extension    Ankle dorsiflexion    Ankle plantarflexion    Ankle inversion    Ankle eversion     (Blank rows = not tested)  LUMBAR SPECIAL TESTS:  Straight leg raise test: Negative and Slump test: Negative Active straight leg raise with pelvic compression - more pain in Rt hip  FUNCTIONAL TESTS:  Single leg standing - stable and no trendelenburg bilaterally  GAIT:  Comments: slightly flexed trunk    TODAY'S TREATMENT:  [facility] Adult PT Treatment:                                                                                                                             DATE: 07/25/22 Nustep lvl 4, 6 min LTR x20  Double knee to chest 2x30 sec PPT x20  Bridges 2x10  Supine clams with blue loop 2x10  Supine marching with blue loop 2x10 bilat  Piriformis stretch 2x30 sec  SLR 2x10, bilat   DATE: 07/07/22 Initial HEP and dry needling information provided and discussed  PATIENT EDUCATION:  Education details: Access Code: [place] Person educated: Patient Education method: Explanation, Demonstration, Tactile cues, Verbal cues, and Handouts Education comprehension: verbalized  understanding and returned demonstration   HOME EXERCISE PROGRAM: Access Code: [email or web address] URL: [email or web address] Date: 07/07/2022 Prepared by: Jari Favre  Exercises - Supine March  - 1 x daily - 7 x weekly - 3 sets - 10 reps  Patient Education - Trigger Point Dry Needling  ASSESSMENT:  CLINICAL IMPRESSION: Pt presents to her first follow up PT appt with reported improvements since doing stretches. She states that the back pain is always present. Session with focus on mobility and proximal hip strengthening. She requires vc for controlled movements. Pt had no adverse effects with prescribed exercises, but reports a cramp in her posterior thigh on R side at end of session when performing SLR's. Addaday to L lower back, glute and thigh with pt reporting some improvements. She has significant tension throughout her posterior chain. Pt will continue to benefit from skilled PT to address continued deficits.  OBJECTIVE IMPAIRMENTS: decreased activity tolerance, decreased coordination, decreased mobility, decreased ROM, decreased strength, increased muscle spasms, impaired flexibility, impaired tone, postural dysfunction, and pain.   ACTIVITY LIMITATIONS: lifting, bending, standing, and squatting  PARTICIPATION LIMITATIONS: meal prep, cleaning, laundry, and community activity  PERSONAL FACTORS: 3+ comorbidities: Appendix removed, ovary remove, abdominal hysterectomy, mastectomy Rt side, history of cancer, widowed in 2021  are also affecting patient's functional outcome.   REHAB POTENTIAL: Excellent  CLINICAL DECISION MAKING: Stable/uncomplicated  EVALUATION COMPLEXITY: Low   GOALS: Goals reviewed with patient? Yes  SHORT TERM GOALS: Target date: 08/04/2022  Ind with basic core strengthening exercises Baseline: Goal status: INITIAL  2.  FOTO assessed at next visit Baseline:  Goal status: INITIAL  3.  Pt will report 20% less pain in gluteal and  lateral bilateral legs. Baseline:  Goal status: INITIAL   LONG TERM GOALS: Target date: 09/29/2022  Pt will be independent with advanced HEP to maintain improvements made throughout therapy  Baseline:  Goal status: INITIAL  2.  Pt will report 30% reduction of pain due to improvements in posture, strength, and muscle length  Baseline:  Goal status: INITIAL  3.  Pt will be able to vacuum for 15-20 minutes at a time without referred pain Baseline:  Goal status: INITIAL  4.  Pt will demonstrate 5/5 hip strength for improved ability to do functional lift without bending forward Baseline:  Goal status: INITIAL     PLAN: PT FREQUENCY: 1-2x/week  PT DURATION: 12 weeks  PLANNED INTERVENTIONS: Therapeutic exercises, Therapeutic activity, Neuromuscular re-education, Balance training, Gait training, Patient/Family education, Self Care, Joint mobilization, Aquatic Therapy, Dry Needling, Electrical stimulation, Spinal manipulation, Spinal mobilization, Cryotherapy, Moist heat, scar mobilization, Taping, Traction, Biofeedback, Manual therapy, and Re-evaluation.  PLAN FOR NEXT SESSION: FOTO; core strength; dry needling or STM to lumbar; lumbar traction possibly   Lynden Ang, PT 07/25/2022, 10:09 AM

## 2022-07-25 ENCOUNTER — Ambulatory Visit: Payer: Medicare Other | Attending: Family Medicine | Admitting: Physical Therapy

## 2022-07-25 ENCOUNTER — Encounter: Payer: Self-pay | Admitting: Physical Therapy

## 2022-07-25 DIAGNOSIS — M6281 Muscle weakness (generalized): Secondary | ICD-10-CM | POA: Diagnosis present

## 2022-07-25 DIAGNOSIS — R293 Abnormal posture: Secondary | ICD-10-CM | POA: Diagnosis present

## 2022-07-25 DIAGNOSIS — M5416 Radiculopathy, lumbar region: Secondary | ICD-10-CM | POA: Diagnosis present

## 2022-07-25 DIAGNOSIS — M5459 Other low back pain: Secondary | ICD-10-CM | POA: Diagnosis not present

## 2022-07-25 NOTE — Progress Notes (Signed)
Evaluation Performed:  Follow-up visit  Date:  08/06/2022   ID:  Cindy Schwartz, DOB Jan 23, 1934, MRN [id number]  PCP:  Marin Olp, MD  Cardiologist:  Jenkins Rouge, MD   Electrophysiologist:  None   Chief Complaint:  Palpitations/ Dizziness  History of Present Illness:    88 y.o. with history of HTN, fusiform aortic root dilatation 4.1 cm.  Chest pain January 2016 with normal cardiac catheterization. Palpitations with monitor only showing PACs;/PVC;s  Breast cancer with mastectomy August 2018. Husband diagnosed with lung cancer at same time.   Has two grandson's one [name] married living in [place] and one [name] has been in [place] and [place] cooking with wife   Having some chronic back pain [name] primary ordered plan films 07/31/20 showed chronic stable L1 compression deformity  Echo 01/25/18 showed AV sclerosis EF 60-65% estimated PA pressure 34 mmHg  Husband of 63 years finally passes of ILD/Lung Cancer and pneumonia 05/14/20 She has been even more depressed   She has severe multifactorial spinal canal stenosis with cauda equina nerve root impingement L34 She had joint indection 07/07/22  Primary issue is arthritis in left thumb and back pain Having good luck with PT/OT Spending Thanksgiving in [place] with her daughter and grand kids  Past Medical History:  Diagnosis Date   Aneurysm of ascending aorta ([place])    a. 4.2-4.3cm in 2014.   Anxiety    Arthritis    Breast cancer ([place]) 2018   right mastectomy   Cancer ([place]) 1980   ovarian cancer   Compression fracture of L1 lumbar vertebra ([place])    COMPRESSION FRACTURE, THORACIC VERTEBRA 07/24/2010   Qualifier: Diagnosis of  By: [name], [name]    Elevated BP Transient 09/2014   Genetic testing 07/09/2017   Multi-Cancer panel (83 genes) @ [facility] - No pathogenic mutations detected   GERD (gastroesophageal reflux disease)    H/O bone density study    2015   H/O colonoscopy    History of ovarian cancer     November 1979   History of ovarian cancer 1979   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   Insomnia    Irregular heart beat    Normal coronary arteries Cath - 09/2014   Osteoporosis    Raynaud's disease    Past Surgical History:  Procedure Laterality Date   ABDOMINAL HYSTERECTOMY  1979   APPENDECTOMY     BILATERAL SALPINGOOPHORECTOMY     BREAST LUMPECTOMY     COLONOSCOPY  02/2012   LEFT HEART CATHETERIZATION WITH CORONARY ANGIOGRAM N/A 10/17/2014   Procedure: LEFT HEART CATHETERIZATION WITH CORONARY ANGIOGRAM;  Surgeon: [name], MD;  [facility] C[facility] LAB;  Service: Cardiovascular;  Laterality: N/A;   ovarian ca s/p taj/bso  1979   SIMPLE MASTECTOMY WITH AXILLARY SENTINEL NODE BIOPSY Right 05/05/2017   Procedure: RIGHT TOTAL MASTECTOMY WITH RIGHT  AXILLARY SENTINEL NODE BIOPSY;  Surgeon: [name], MD;  Location: [place];  Service: General;  Laterality: Right;  PECTORAL BLOCK     Current Meds  Medication Sig   Ascorbic Acid (VITAMIN C) 1000 MG tablet Take 1,000 mg by mouth daily.   aspirin EC (ASPIRIN LOW DOSE) 81 MG tablet TAKE 1 TABLET DAILY (SWALLOW WHOLE)   azelastine (ASTELIN) 0.1 % nasal spray Place 2 sprays into both nostrils 2 (two) times daily.   Cholecalciferol 25 MCG (1000 UT) capsule Take by mouth.   COLLAGEN PO Take by mouth.   esomeprazole (NEXIUM) 20 MG  capsule Take 1 capsule (20 mg total) by mouth daily.   MAGNESIUM PO Take 300 mg by mouth.   rosuvastatin (CRESTOR) 5 MG tablet Take 1 tablet (5 mg total) by mouth daily. Take 1 tablet daily by mouth.   valACYclovir (VALTREX) 500 MG tablet Take 1 tablet (500 mg total) by mouth 2 (two) times a week.     Allergies:   Prednisone and Chlorhexidine gluconate   Social History   Tobacco Use   Smoking status: Never   Smokeless tobacco: Never  Vaping Use   Vaping Use: Never used  Substance Use Topics   Alcohol use: No   Drug use: No     Family Hx: The patient's family history  includes Breast cancer (age of onset: 3) in her cousin; Colon polyps in her brother; Esophageal cancer (age of onset: 48) in her cousin; Healthy in her brother; Hip fracture in her mother; Other in her father. There is no history of Colon cancer, Pancreatic cancer, or Stomach cancer.  ROS:   Please see the history of present illness.     All other systems reviewed and are negative.   Prior CV studies:   The following studies were reviewed today:  Echo 01/25/18 Holter 09/25/15   Labs/Other Tests and Data Reviewed:    EKG  SR RBBB no acute changes   Recent Labs: 03/12/2022: TSH 0.863 07/09/2022: ALT 16; BUN 13; Creatinine, Ser 0.65; Hemoglobin 12.8; Platelets 260.0; Potassium 4.4; Sodium 133   Recent Lipid Panel Lab Results  Component Value Date/Time   CHOL 129 07/09/2022 08:42 AM   TRIG 34.0 07/09/2022 08:42 AM   HDL 65.20 07/09/2022 08:42 AM   CHOLHDL 2 07/09/2022 08:42 AM   LDLCALC 57 07/09/2022 08:42 AM   LDLCALC 93 06/11/2020 09:32 AM   LDLDIRECT 66.0 01/08/2022 08:44 AM    Wt Readings from Last 3 Encounters:  08/06/22 141 lb 6.4 oz (64.1 kg)  07/29/22 139 lb (63 kg)  07/09/22 138 lb 6.4 oz (62.8 kg)     Objective:    Vital Signs:  BP 128/62   Pulse 71   Ht '5\' 5"'$  (1.651 m)   Wt 141 lb 6.4 oz (64.1 kg)   LMP  (LMP Unknown)   SpO2 97%   BMI 23.53 kg/m    Depressed  Well nourished, well developed female in no acute distress. Skin warm and dry No tachypnea Post right mastectomy No JVP elevation No edema 2/6 SEM  Plus 3 PT/DP  ASSESSMENT & PLAN:      1. Dizziness/ Lightheadedness:  -non cardiac benign holter 09/25/15 normal exam and no CAD at cath 2016    2. H/o PVCs/PAC:  -benign PRN Inderal    3. Breast CA:   -Post right mastectomy [name] 05/05/17 F/U oncology    4. Stress:  -depression related to husbands death consoled f/u primary    5. Thoracic aortic aneurysm:  -followed by Dr. [name]. MRA 02/04/21 stable 4.2 cm similar To CT done December  2005 Given age not sure this needs to be imaged further Will see CVTS next year    6. Murmur: - no change on exam AV sclerosis    7. Cholesterol:   -LDL 56 labs done 12/11/20  continue low dose crestor    8. Cough:  - Cyclic sees pulmonary continue flonase and anti reflux measures atrovent was added Suspect pollen and allergies making things worse currently F/U Dr Yong Channel  9. Lumbago:  spinal stenosis L34 post injection 07/07/22 f/u  Medication Adjustments/Labs and Tests Ordered: Current medicines are reviewed at length with the patient today.  Concerns regarding medicines are outlined above.  Tests Ordered:  None   Medication Changes: No orders of the defined types were placed in this encounter.   Disposition:  Follow up in a year   Signed, Jenkins Rouge, MD  08/06/2022 9:46 AM    [place]

## 2022-07-29 ENCOUNTER — Encounter: Payer: Self-pay | Admitting: Gastroenterology

## 2022-07-29 ENCOUNTER — Ambulatory Visit (INDEPENDENT_AMBULATORY_CARE_PROVIDER_SITE_OTHER): Payer: Medicare Other | Admitting: Gastroenterology

## 2022-07-29 VITALS — BP 118/72 | HR 73 | Ht 65.0 in | Wt 139.0 lb

## 2022-07-29 DIAGNOSIS — R140 Abdominal distension (gaseous): Secondary | ICD-10-CM

## 2022-07-29 DIAGNOSIS — R142 Eructation: Secondary | ICD-10-CM | POA: Diagnosis not present

## 2022-07-29 DIAGNOSIS — R109 Unspecified abdominal pain: Secondary | ICD-10-CM | POA: Diagnosis not present

## 2022-07-29 NOTE — Patient Instructions (Signed)
IBgard samples provided.   Follow up in 4-5 weeks if needed.   _______________________________________________________  If you are age 86 or older, your body mass index should be between 23-30. Your Body mass index is 23.13 kg/m. If this is out of the aforementioned range listed, please consider follow up with your Primary Care Provider.  If you are age 53 or younger, your body mass index should be between 19-25. Your Body mass index is 23.13 kg/m. If this is out of the aformentioned range listed, please consider follow up with your Primary Care Provider.   ________________________________________________________  The [facility] providers would like to encourage you to use [facility] to communicate with providers for non-urgent requests or questions.  Due to long hold times on the telephone, sending your provider a message by [facility] may be a faster and more efficient way to get a response.  Please allow 48 business hours for a response.  Please remember that this is for non-urgent requests.  _______________________________________________________

## 2022-07-29 NOTE — Progress Notes (Unsigned)
07/29/2022 Cindy Schwartz [id number] 1934-04-16   HISTORY OF PRESENT ILLNESS: This is an 88 year old female who is a patient of Dr. [name], new to him in March 2023 which was the only time that she has been seen by him.  She has chronic constipation and takes slippery elm bark for that.  Also has history of GERD and esophageal dysmotility.  She is here today with complaints of some intermittent left-sided abdominal discomfort and gas/bloating.  She was having some diarrhea recently for 10 days, but that has now resolved.  Also reports a lot of belching.  Is on Nexium 20 mg daily.  She does not feel any overt heartburn or reflux symptoms.  Looks like she was complaining of abdominal pain back in 2021 at the time of her colonoscopy and they thought at that time maybe she was having some pain related to adhesions from previous surgeries.  CT scan of the abdomen and pelvis with contrast in November 2020 also for left-sided abdominal pain showed no cause of pain.  Last colonoscopy in March 2021 with Dr. Marchelle Folks [facility] at which time she had some diverticulosis and some polyps that were removed were tubular adenomas and one sessile serrated polyp.  No future surveillance recommended due to age.   Past Medical History:  Diagnosis Date   Aneurysm of ascending aorta ([place])    a. 4.2-4.3cm in 2014.   Anxiety    Arthritis    Breast cancer ([place]) 2018   right mastectomy   Cancer ([place]) 1980   ovarian cancer   Compression fracture of L1 lumbar vertebra ([place])    COMPRESSION FRACTURE, THORACIC VERTEBRA 07/24/2010   Qualifier: Diagnosis of  By: [name], [name]    Elevated BP Transient 09/2014   Genetic testing 07/09/2017   Multi-Cancer panel (83 genes) @ [facility] - No pathogenic mutations detected   GERD (gastroesophageal reflux disease)    H/O bone density study    2015   H/O colonoscopy    History of ovarian cancer    November 1979   History of ovarian cancer 1979   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   Insomnia    Irregular heart beat    Normal coronary arteries Cath - 09/2014   Osteoporosis    Raynaud's disease    Past Surgical History:  Procedure Laterality Date   ABDOMINAL HYSTERECTOMY  1979   APPENDECTOMY     BILATERAL SALPINGOOPHORECTOMY     BREAST LUMPECTOMY     COLONOSCOPY  02/2012   LEFT HEART CATHETERIZATION WITH CORONARY ANGIOGRAM N/A 10/17/2014   Procedure: LEFT HEART CATHETERIZATION WITH CORONARY ANGIOGRAM;  Surgeon: [name], MD;  Location: [facility] CATH LAB;  Service: Cardiovascular;  Laterality: N/A;   ovarian ca s/p taj/bso  1979   SIMPLE MASTECTOMY WITH AXILLARY SENTINEL NODE BIOPSY Right 05/05/2017   Procedure: RIGHT TOTAL MASTECTOMY WITH RIGHT  AXILLARY SENTINEL NODE BIOPSY;  Surgeon: [name], MD;  Location: [place];  Service: General;  Laterality: Right;  PECTORAL BLOCK    reports that she has never smoked. She has never used smokeless tobacco. She reports that she does not drink alcohol and does not use drugs. family history includes Breast cancer (age of onset: 100) in her cousin; Colon polyps in her brother; Esophageal cancer (age of onset: 57) in her cousin; Healthy in her brother; Hip fracture in her mother; Other in her father. Allergies  Allergen Reactions   Prednisone     Fatigue and  depressed mood on 7 day '40mg'$  x 3 days, 20 mg x 4 days from Dr. Yong Channel- intolerance   Chlorhexidine Gluconate Rash      Outpatient Encounter Medications as of 07/29/2022  Medication Sig   Ascorbic Acid (VITAMIN C) 1000 MG tablet Take 1,000 mg by mouth daily.   aspirin EC (ASPIRIN LOW DOSE) 81 MG tablet TAKE 1 TABLET DAILY (SWALLOW WHOLE)   azelastine (ASTELIN) 0.1 % nasal spray Place 2 sprays into both nostrils 2 (two) times daily.   Cholecalciferol 25 MCG (1000 UT) capsule Take by mouth.   COLLAGEN PO Take by mouth.   esomeprazole (NEXIUM) 20 MG capsule Take 1 capsule (20 mg total) by mouth daily.   MAGNESIUM PO Take 300  mg by mouth.   rosuvastatin (CRESTOR) 5 MG tablet Take 1 tablet (5 mg total) by mouth daily. Take 1 tablet daily by mouth.   valACYclovir (VALTREX) 500 MG tablet Take 1 tablet (500 mg total) by mouth 2 (two) times a week.   No facility-administered encounter medications on file as of 07/29/2022.    REVIEW OF SYSTEMS  : All other systems reviewed and negative except where noted in the History of Present Illness.   PHYSICAL EXAM: BP 118/72   Pulse 73   Ht '5\' 5"'$  (1.651 m)   Wt 139 lb (63 kg)   LMP  (LMP Unknown)   SpO2 97%   BMI 23.13 kg/m  General: Well developed white female in no acute distress Head: Normocephalic and atraumatic Eyes:  Sclerae anicteric, conjunctiva pink. Ears: Normal auditory acuity Lungs: Clear throughout to auscultation; no W/R/R. Heart: Regular rate and rhythm; no M/R/G. Abdomen: Soft, non-distended.  BS present.  Non-tender. Musculoskeletal: Symmetrical with no gross deformities  Skin: No lesions on visible extremities Extremities: No edema  Neurological: Alert oriented x 4, grossly non-focal Psychological:  Alert and cooperative. Normal mood and affect  ASSESSMENT AND PLAN: *88 year old female with chronic constipation and GERD/esophageal dysmotility.  Today reports a lot of gas and bloating as well as belching and intermittent pain on the left side of her abdomen.  Has reported pain in the past and was thought to possibly be due to adhesions from previous surgeries.  I agree with that.  We are going to try some IBgard samples, also FDgard may be an option to try as well for her belching symptoms.  I am going to make her a follow-up to come back to see me again in about 4 to 5 weeks.  We talked about repeating CT scan of the last was 3 years ago, but she declined for now.   CC:  Marin Olp, MD

## 2022-07-31 ENCOUNTER — Encounter: Payer: Self-pay | Admitting: Gastroenterology

## 2022-07-31 DIAGNOSIS — R140 Abdominal distension (gaseous): Secondary | ICD-10-CM | POA: Insufficient documentation

## 2022-07-31 DIAGNOSIS — R109 Unspecified abdominal pain: Secondary | ICD-10-CM | POA: Insufficient documentation

## 2022-07-31 DIAGNOSIS — R142 Eructation: Secondary | ICD-10-CM | POA: Insufficient documentation

## 2022-07-31 NOTE — Therapy (Addendum)
OUTPATIENT PHYSICAL THERAPY THORACOLUMBAR THERAPY VISIT   Patient Name: Cindy Schwartz MRN: [id number] DOB:06/18/1934, 88 y.o., female Today's Date: 08/21/2022      Past Medical History:  Diagnosis Date   Aneurysm of ascending aorta ([place])    a. 4.2-4.3cm in 2014.   Anxiety    Arthritis    Breast cancer ([place]) 2018   right mastectomy   Cancer ([place]) 1980   ovarian cancer   Compression fracture of L1 lumbar vertebra ([place])    COMPRESSION FRACTURE, THORACIC VERTEBRA 07/24/2010   Qualifier: Diagnosis of  By: [name], [name]    Elevated BP Transient 09/2014   Genetic testing 07/09/2017   Multi-Cancer panel (83 genes) @ [facility] - No pathogenic mutations detected   GERD (gastroesophageal reflux disease)    H/O bone density study    2015   H/O colonoscopy    History of ovarian cancer    November 1979   History of ovarian cancer 1979   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   Insomnia    Irregular heart beat    Normal coronary arteries Cath - 09/2014   Osteoporosis    Raynaud's disease    Past Surgical History:  Procedure Laterality Date   ABDOMINAL HYSTERECTOMY  1979   APPENDECTOMY     BILATERAL SALPINGOOPHORECTOMY     BREAST LUMPECTOMY     COLONOSCOPY  02/2012   LEFT HEART CATHETERIZATION WITH CORONARY ANGIOGRAM N/A 10/17/2014   Procedure: LEFT HEART CATHETERIZATION WITH CORONARY ANGIOGRAM;  Surgeon: [name], MD;  Location: [facility] CATH LAB;  Service: Cardiovascular;  Laterality: N/A;   ovarian ca s/p taj/bso  1979   SIMPLE MASTECTOMY WITH AXILLARY SENTINEL NODE BIOPSY Right 05/05/2017   Procedure: RIGHT TOTAL MASTECTOMY WITH RIGHT  AXILLARY SENTINEL NODE BIOPSY;  Surgeon: [name], MD;  Location: [place];  Service: General;  Laterality: Right;  PECTORAL BLOCK   Patient Active Problem List   Diagnosis Date Noted   Bloating 07/31/2022   Left sided abdominal pain 07/31/2022   Belching 07/31/2022   COPD (chronic obstructive pulmonary disease)  ([place]) 03/17/2022   Osteoarthritis of carpometacarpal (CMC) joints of both thumbs 09/26/2021   Pain due to onychomycosis of toenails of both feet 12/25/2020   History of cerebellar stroke 10/05/2020   Memory loss 10/05/2020   Osteoarthritis of both hands 08/29/2020   Bilateral carpal tunnel syndrome 08/29/2020   Trigger thumb of left hand 08/29/2020   History of adenomatous polyp of colon 12/30/2019   Mild hyperlipidemia 06/01/2019   Degenerative arthritis of knee, bilateral 04/08/2019   Chronic idiopathic constipation 03/01/2019   Chronic cough 01/21/2018   Genetic testing 07/09/2017   history of Breast cancer of lower-outer quadrant of right female breast  01/16/2016   Palpitations 11/13/2014   Normal coronary arteries 10/17/2014   Varicose veins of lower extremities with other complications 40/97/3532   Hx of herpes simplex infection 12/18/2011   Low back pain 06/28/2010   Lower abdominal pain 06/28/2010   Generalized anxiety disorder 10/13/2008   Irritable bowel syndrome 10/13/2008   Lung nodule 11/25/2007   Raynaud's syndrome 07/01/2007   Aneurysm of thoracic aorta ([place]) 05/06/2007   Allergic rhinitis 05/06/2007   Esophageal reflux 05/06/2007   DEGENERATIVE DISC DISEASE, CERVICAL SPINE 05/06/2007   Osteoporosis 03/18/2007   History of ovarian cancer 09/22/1977    PCP: Marin Olp, MD   REFERRING PROVIDER: Gregor Hams, MD  REFERRING DIAG: [phone] (ICD-10-CM) - Chronic bilateral low back pain with right-sided sciatica  M70.61 (ICD-10-CM) - Trochanteric bursitis, right hip  Rationale for Evaluation and Treatment Rehabilitation  THERAPY DIAG:  Muscle weakness (generalized)  Other low back pain  Abnormal posture  Radiculopathy, lumbar region  ONSET DATE: January 2023  SUBJECTIVE:                                                                                                                                                                                            SUBJECTIVE STATEMENT: Pt states that she is stiff this morning. She also reports that the addaday did not feel good after last session. She was in pain for a few days after.  PERTINENT HISTORY:  Appendix removed, ovary remove, abdominal hysterectomy, mastectomy Rt side, history of cancer, widowed in 2021  PAIN:  Are you having pain? Yes NPRS scale: 9-10/10 (when the stabbing pain happens; but typically just feeling a nagging feeling) Pain location: low back bilaterally Pain orientation: Lower  PAIN TYPE: sharp or nagging Pain description: stabbing  Aggravating factors: cleaning the house like vacuum or leaning forward Relieving factors: heat   PRECAUTIONS: None  WEIGHT BEARING RESTRICTIONS: No  FALLS:  Has patient fallen in last 6 months? No  LIVING ENVIRONMENT: Lives with: lives alone Lives in: House/apartment   OCCUPATION: retired  PLOF: Independent  PATIENT GOALS: be able to keep the house and not get worse   OBJECTIVE:   DIAGNOSTIC FINDINGS:    PATIENT SURVEYS:  FOTO (did not do at eval)  SCREENING FOR RED FLAGS: Bowel or bladder incontinence: No very rarely leakage just when I have no time to go; nocturia I have to get up 3x Spinal tumors: No Cauda equina syndrome: No Compression fracture: No Abdominal aneurysm: No  COGNITION:  Overall cognitive status: Within functional limits for tasks assessed     SENSATION: Not tested  MUSCLE LENGTH: Hamstrings: Right 70 deg; Left 70 deg Thomas test:   POSTURE: rounded shoulders and weight shift right - right shoulder rounded more forwards with scapular winging; Rt shoulder depressed and bends towards the right side with more tension in right lumbar paraspinals  PALPATION: In prone pt has tenderness to palpation L4/5 Tight lumbar paraspinals bilaterally  LUMBAR ROM:   AROM eval  Flexion WFL   Extension   Right lateral flexion   Left lateral flexion   Right rotation   Left rotation     (Blank rows = not tested)  LOWER EXTREMITY ROM:     Passive  Right eval Left eval  Hip flexion [facility]  [facility]   Hip extension    Hip abduction    Hip adduction    Hip internal  rotation 75% 75%  Hip external rotation [facility]  [facility]   Knee flexion    Knee extension    Ankle dorsiflexion    Ankle plantarflexion    Ankle inversion    Ankle eversion     (Blank rows = not tested)  LOWER EXTREMITY MMT:    MMT (out of 5) Right eval Left eval  Hip flexion 5 5  Hip extension 4+ 4+  Hip abduction 4 4  Hip adduction 4 4  Hip internal rotation 5 5  Hip external rotation 5 5  Knee flexion    Knee extension    Ankle dorsiflexion    Ankle plantarflexion    Ankle inversion    Ankle eversion     (Blank rows = not tested)  LUMBAR SPECIAL TESTS:  Straight leg raise test: Negative and Slump test: Negative Active straight leg raise with pelvic compression - more pain in Rt hip  FUNCTIONAL TESTS:  Single leg standing - stable and no trendelenburg bilaterally  GAIT:  Comments: slightly flexed trunk    TODAY'S TREATMENT:  [facility] Adult PT Treatment:                                                                                                                             DATE: 07/25/22 Nustep lvl 4, 6 min LTR x20  Double knee to chest 2x30 sec PPT x20  HS stretch 2x30 sec, bilat  IT band stretch 2x30, bilat  Bridges 2x10  Supine clams with yellow loop 2x10  Supine marching with yellow loop 2x10 bilat, with TrA activation Piriformis stretch 2x30 sec  SLR 2x10, 1.5# bilat   DATE: 07/07/22 Initial HEP and dry needling information provided and discussed  PATIENT EDUCATION:  Education details: Access Code: [place] Person educated: Patient Education method: Explanation, Demonstration, Tactile cues, Verbal cues, and Handouts Education comprehension: verbalized understanding and returned demonstration   HOME EXERCISE PROGRAM: Access Code: [email or web address] URL:  [email or web address] Date: 07/07/2022 Prepared by: Jari Favre  Exercises - Supine March  - 1 x daily - 7 x weekly - 3 sets - 10 reps  Patient Education - Trigger Point Dry Needling  ASSESSMENT:  CLINICAL IMPRESSION: Pt reports to PT with increased stiffness in her bilat LE's and lower back. Session with focus on lumbar/ hip mobility and strengthening. Pt tolerated session well, but reports intermittent cramps in her quad and hamstrings. Encouraged pt to drink more water throughout the day. Plan to incorporate more TrA activation exercises next session.   OBJECTIVE IMPAIRMENTS: decreased activity tolerance, decreased coordination, decreased mobility, decreased ROM, decreased strength, increased muscle spasms, impaired flexibility, impaired tone, postural dysfunction, and pain.   ACTIVITY LIMITATIONS: lifting, bending, standing, and squatting  PARTICIPATION LIMITATIONS: meal prep, cleaning, laundry, and community activity  PERSONAL FACTORS: 3+ comorbidities: Appendix removed, ovary remove, abdominal hysterectomy, mastectomy Rt side, history of cancer, widowed in 2021  are also affecting patient's functional outcome.   REHAB POTENTIAL: Excellent  CLINICAL DECISION MAKING:  Stable/uncomplicated  EVALUATION COMPLEXITY: Low   GOALS: Goals reviewed with patient? Yes  SHORT TERM GOALS: Target date: 08/04/2022  Ind with basic core strengthening exercises Baseline: Goal status: INITIAL  2.  FOTO assessed at next visit Baseline:  Goal status: INITIAL  3.  Pt will report 20% less pain in gluteal and lateral bilateral legs. Baseline:  Goal status: INITIAL   LONG TERM GOALS: Target date: 09/29/2022  Pt will be independent with advanced HEP to maintain improvements made throughout therapy  Baseline:  Goal status: INITIAL  2.  Pt will report 30% reduction of pain due to improvements in posture, strength, and muscle length  Baseline:  Goal status:  INITIAL  3.  Pt will be able to vacuum for 15-20 minutes at a time without referred pain Baseline:  Goal status: INITIAL  4.  Pt will demonstrate 5/5 hip strength for improved ability to do functional lift without bending forward Baseline:  Goal status: INITIAL     PLAN: PT FREQUENCY: 1-2x/week  PT DURATION: 12 weeks  PLANNED INTERVENTIONS: Therapeutic exercises, Therapeutic activity, Neuromuscular re-education, Balance training, Gait training, Patient/Family education, Self Care, Joint mobilization, Aquatic Therapy, Dry Needling, Electrical stimulation, Spinal manipulation, Spinal mobilization, Cryotherapy, Moist heat, scar mobilization, Taping, Traction, Biofeedback, Manual therapy, and Re-evaluation.  PLAN FOR NEXT SESSION: FOTO; core strength; dry needling or STM to lumbar; lumbar traction possibly   Lynden Ang, PT 08/21/2022, 9:38 AM   PHYSICAL THERAPY DISCHARGE SUMMARY  Visits from Start of Care: 3  Current functional level related to goals / functional outcomes: Pt did not make significant progress due to only being seen for 3 sessions including eval.    Remaining deficits: Weakness and stiffness in bilat LE's.    Education / Equipment: None at this time.    Patient agrees to discharge. Patient goals were not met. Patient is being discharged due to the patient's request.

## 2022-08-01 ENCOUNTER — Encounter: Payer: Self-pay | Admitting: Physical Therapy

## 2022-08-01 ENCOUNTER — Ambulatory Visit: Payer: Medicare Other | Admitting: Physical Therapy

## 2022-08-01 DIAGNOSIS — R293 Abnormal posture: Secondary | ICD-10-CM

## 2022-08-01 DIAGNOSIS — M5459 Other low back pain: Secondary | ICD-10-CM | POA: Diagnosis not present

## 2022-08-01 DIAGNOSIS — M5416 Radiculopathy, lumbar region: Secondary | ICD-10-CM

## 2022-08-01 DIAGNOSIS — M6281 Muscle weakness (generalized): Secondary | ICD-10-CM

## 2022-08-06 ENCOUNTER — Encounter: Payer: Self-pay | Admitting: Cardiovascular Disease

## 2022-08-06 ENCOUNTER — Ambulatory Visit: Payer: Medicare Other | Attending: Cardiovascular Disease | Admitting: Cardiovascular Disease

## 2022-08-06 VITALS — BP 128/62 | HR 71 | Ht 65.0 in | Wt 141.4 lb

## 2022-08-06 DIAGNOSIS — I493 Ventricular premature depolarization: Secondary | ICD-10-CM | POA: Diagnosis not present

## 2022-08-06 DIAGNOSIS — I712 Thoracic aortic aneurysm, without rupture, unspecified: Secondary | ICD-10-CM

## 2022-08-06 DIAGNOSIS — R011 Cardiac murmur, unspecified: Secondary | ICD-10-CM

## 2022-08-06 NOTE — Patient Instructions (Signed)
Medication Instructions:  Your physician recommends that you continue on your current medications as directed. Please refer to the Current Medication list given to you today.  *If you need a refill on your cardiac medications before your next appointment, please call your pharmacy*  Lab Work: If you have labs (blood work) drawn today and your tests are completely normal, you will receive your results only by: MyChart Message (if you have MyChart) OR A paper copy in the mail If you have any lab test that is abnormal or we need to change your treatment, we will call you to review the results.  Testing/Procedures: None ordered today.  Follow-Up: At [facility], you and your health needs are our priority.  As part of our continuing mission to provide you with exceptional heart care, we have created designated Provider Care Teams.  These Care Teams include your primary Cardiologist (physician) and Advanced Practice Providers (APPs -  Physician Assistants and Nurse Practitioners) who all work together to provide you with the care you need, when you need it.  We recommend signing up for the patient portal called "MyChart".  Sign up information is provided on this After Visit Summary.  MyChart is used to connect with patients for Virtual Visits (Telemedicine).  Patients are able to view lab/test results, encounter notes, upcoming appointments, etc.  Non-urgent messages can be sent to your provider as well.   To learn more about what you can do with MyChart, go to [email or web address].    Your next appointment:   1 year(s)  The format for your next appointment:   In Person  Provider:   Peter Nishan, MD     Important Information About Sugar       

## 2022-08-07 ENCOUNTER — Ambulatory Visit: Payer: Medicare Other | Admitting: Family Medicine

## 2022-08-08 ENCOUNTER — Encounter: Payer: Medicare Other | Admitting: Physical Therapy

## 2022-08-19 ENCOUNTER — Ambulatory Visit: Payer: Medicare Other | Admitting: Podiatry

## 2022-08-22 ENCOUNTER — Ambulatory Visit: Payer: Medicare Other

## 2022-09-03 ENCOUNTER — Ambulatory Visit: Payer: Medicare Other | Admitting: Gastroenterology

## 2022-09-30 ENCOUNTER — Ambulatory Visit (INDEPENDENT_AMBULATORY_CARE_PROVIDER_SITE_OTHER): Payer: Medicare Other

## 2022-09-30 VITALS — Wt 141.0 lb

## 2022-09-30 DIAGNOSIS — Z0000 Encounter for general adult medical examination without abnormal findings: Secondary | ICD-10-CM | POA: Diagnosis not present

## 2022-09-30 NOTE — Progress Notes (Signed)
I connected with  Ritu Gagliardo on 09/30/22 by a audio enabled telemedicine application and verified that I am speaking with the correct person using two identifiers.  Patient Location: Home  Provider Location: Office/Clinic  I discussed the limitations of evaluation and management by telemedicine. The patient expressed understanding and agreed to proceed.   Subjective:   Cindy Schwartz is a 89 y.o. female who presents for Medicare Annual (Subsequent) preventive examination.  Review of Systems     Cardiac Risk Factors include: advanced age (>56mn, >>45women);dyslipidemia     Objective:    Today's Vitals   09/30/22 0804  Weight: 141 lb (64 kg)   Body mass index is 23.46 kg/m.     09/30/2022    8:08 AM 07/07/2022   10:36 AM 03/12/2022   12:21 PM 01/22/2022   10:26 AM 09/24/2021    8:09 AM 09/10/2020    9:00 AM 08/25/2019    8:53 AM  Advanced Directives  Does Patient Have a Medical Advance Directive? Yes Yes No Yes Yes Yes Yes  Type of AParamedicof [place] will [place] will  Living will;Healthcare Power of [place]eneca Knoll[place]ory Valley[place] will  Does patient want to make changes to medical advance directive?    No - Patient declined   No - Patient declined  Copy of [place] Chart? No - copy requested   No - copy requested No - copy requested  No - copy requested  Would patient like information on creating a medical advance directive?   No - Patient declined        Current Medications (verified) Outpatient Encounter Medications as of 09/30/2022  Medication Sig   Ascorbic Acid (VITAMIN C) 1000 MG tablet Take 1,000 mg by mouth daily.   aspirin EC (ASPIRIN LOW DOSE) 81 MG tablet TAKE 1 TABLET DAILY (SWALLOW WHOLE)   Cholecalciferol 25 MCG (1000 UT) capsule Take by mouth.   COLLAGEN PO Take by mouth.   esomeprazole (NEXIUM) 20 MG capsule Take 1 capsule  (20 mg total) by mouth daily.   gabapentin (NEURONTIN) 100 MG capsule Take 100 mg by mouth 2 (two) times daily.   MAGNESIUM PO Take 300 mg by mouth.   rosuvastatin (CRESTOR) 5 MG tablet Take 1 tablet (5 mg total) by mouth daily. Take 1 tablet daily by mouth.   valACYclovir (VALTREX) 500 MG tablet Take 1 tablet (500 mg total) by mouth 2 (two) times a week.   azelastine (ASTELIN) 0.1 % nasal spray Place 2 sprays into both nostrils 2 (two) times daily. (Patient not taking: Reported on 09/30/2022)   No facility-administered encounter medications on file as of 09/30/2022.    Allergies (verified) Prednisone and Chlorhexidine gluconate   History: Past Medical History:  Diagnosis Date   Aneurysm of ascending aorta ([place]    a. 4.2-4.3cm in 2014.   Anxiety    Arthritis    Breast cancer ([place]   right mastectomy   [place] ([place]   ovarian cancer   Compression fracture of L1 lumbar vertebra ([place]    COMPRESSION FRACTURE, THORACIC VERTEBRA 07/24/2010   Qualifier: Diagnosis of  By: JArnoldo MoraleMD, [name]    Elevated BP Transient 09/2014   Genetic testing 07/09/2017   Multi-Cancer panel (83 genes) @ [facility] - No pathogenic mutations detected   GERD (gastroesophageal reflux disease)    H/O bone density study    2015   H/O  colonoscopy    History of ovarian cancer    November 1979   History of ovarian cancer 1979   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   Insomnia    Irregular heart beat    Normal coronary arteries Cath - 09/2014   Osteoporosis    Raynaud's disease    Past Surgical History:  Procedure Laterality Date   ABDOMINAL HYSTERECTOMY  1979   APPENDECTOMY     BILATERAL SALPINGOOPHORECTOMY     BREAST LUMPECTOMY     COLONOSCOPY  02/2012   LEFT HEART CATHETERIZATION WITH CORONARY ANGIOGRAM N/A 10/17/2014   Procedure: LEFT HEART CATHETERIZATION WITH CORONARY ANGIOGRAM;  Surgeon: [name], MD;  Location: [facility] CATH LAB;  Service: Cardiovascular;  Laterality:  N/A;   ovarian ca s/p taj/bso  1979   SIMPLE MASTECTOMY WITH AXILLARY SENTINEL NODE BIOPSY Right 05/05/2017   Procedure: RIGHT TOTAL MASTECTOMY WITH RIGHT  AXILLARY SENTINEL NODE BIOPSY;  Surgeon: [name], MD;  Location: [facility];  Service: General;  Laterality: Right;  PECTORAL BLOCK   Family History  Problem Relation Age of Onset   Hip fracture Mother    Other Father        lived to 31   Healthy Brother    Colon polyps Brother    Breast cancer Cousin 10       mat first cousin related through uncle   Esophageal cancer Cousin 70       mat first cousin related through uncle   Colon cancer Neg Hx    Pancreatic cancer Neg Hx    Stomach cancer Neg Hx    Social History   Socioeconomic History   Marital status: Widowed    Spouse name: [employer] copy   Number of children: 2   Years of education: Not on file   Highest education level: Not on file  Occupational History   Occupation: retired    Fish farm manager: RETIRED  Tobacco Use   Smoking status: Never   Smokeless tobacco: Never  Vaping Use   Vaping Use: Never used  Substance and Sexual Activity   Alcohol use: No   Drug use: No   Sexual activity: Not on file  Other Topics Concern   Not on file  Social History Narrative   Widowed aug 2021. She and her husband  from the [place] of [place]. Children: Mariaguadalupe Fialkowski, lives in [place], [place], lives in [place], [place]. 4 grandkids. No greatgrandkids.       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    Social Determinants of Health   Financial Resource Strain: Low Risk  (09/30/2022)   Overall Financial Resource Strain (CARDIA)    Difficulty of Paying Living Expenses: Not hard at all  Food Insecurity: No Food Insecurity (09/30/2022)   Hunger Vital Sign    Worried About Running Out of Food in the Last Year: Never true    Ran Out of Food in the Last Year: Never true  Transportation Needs: No  Transportation Needs (09/30/2022)   PRAPARE - Hydrologist (Medical): No    Lack of Transportation (Non-Medical): No  Physical Activity: Inactive (09/30/2022)   Exercise Vital Sign    Days of Exercise per Week: 0 days    Minutes of Exercise per Session: 0 min  Stress: No Stress Concern Present (09/30/2022)   [place] -  Occupational Stress Questionnaire    Feeling of Stress : Not at all  Social Connections: Moderately Isolated (09/30/2022)   Social Connection and Isolation Panel [NHANES]    Frequency of Communication with Friends and Family: More than three times a week    Frequency of Social Gatherings with Friends and Family: More than three times a week    Attends Religious Services: More than 4 times per year    Active Member of [employer] or Organizations: No    Attends Archivist Meetings: Never    Marital Status: Widowed    Tobacco Counseling Counseling given: Not Answered   Clinical Intake:  Pre-visit preparation completed: Yes  Pain : No/denies pain     BMI - recorded: 23.46 Nutritional Status: BMI of 19-24  Normal Nutritional Risks: None Diabetes: No  How often do you need to have someone help you when you read instructions, pamphlets, or other written materials from your doctor or pharmacy?: 1 - Never  Diabetic?no  Interpreter Needed?: No  Information entered by :: Charlott Rakes, LPN   Activities of Daily Living    09/30/2022    8:09 AM  In your present state of health, do you have any difficulty performing the following activities:  Hearing? 0  Vision? 0  Difficulty concentrating or making decisions? 0  Walking or climbing stairs? 0  Dressing or bathing? 0  Doing errands, shopping? 0  Preparing Food and eating ? N  Using the Toilet? N  In the past six months, have you accidently leaked urine? Y  Comment at times  Do you have problems with loss of bowel control? Y  Comment at times   Managing your Medications? N  Managing your Finances? N  Housekeeping or managing your Housekeeping? N    Patient Care Team: Marin Olp, MD as PCP - General (Family Medicine) [name], MD as PCP - Cardiology (Cardiology) Ronald Lobo, MD as Consulting Physician (Gastroenterology) Deliah Goody, PA-C as Physician Assistant (Physician Assistant) Neldon Mc Donnamarie Poag, MD as Consulting Physician (Allergy and Immunology) Shon Hough, MD as Consulting Physician (Ophthalmology)  Indicate any recent Medical Services you may have received from other than [facility] providers in the past year (date may be approximate).     Assessment:   This is a routine wellness examination for Cindy Schwartz.  Hearing/Vision screen Hearing Screening - Comments:: Pt denies any hearing issues  Vision Screening - Comments:: Pt follows up  with [facility] ophthalmology for annual eye exams   Dietary issues and exercise activities discussed: Current Exercise Habits: The patient does not participate in regular exercise at present   Goals Addressed             This Visit's Progress    Patient Stated       More faithful to exercise        Depression Screen    09/30/2022    8:06 AM 05/09/2022    1:14 PM 09/24/2021    8:07 AM 05/28/2021   11:26 AM 12/20/2020   10:08 AM 12/11/2020    8:13 AM 09/10/2020    8:58 AM  PHQ 2/9 Scores  PHQ - 2 Score 0 0 0 0 0 0 0    Fall Risk    09/30/2022    8:09 AM 05/09/2022    1:15 PM 09/24/2021    8:10 AM 05/28/2021   11:27 AM 10/05/2020    2:16 PM  Fall Risk   Falls in the past year? 0 0 0 0  0  Number falls in past yr: 0 0 0 0 0  Injury with Fall? 0 0 0 0 0  Risk for fall due to : Impaired balance/gait;Impaired vision No Fall Risks Impaired vision;Impaired balance/gait    Follow up Falls prevention discussed  Falls prevention discussed      FALL RISK PREVENTION PERTAINING TO THE HOME:  Any stairs in or around the home? Yes  If so, are there any without  handrails? No  Home free of loose throw rugs in walkways, pet beds, electrical cords, etc? Yes  Adequate lighting in your home to reduce risk of falls? Yes   ASSISTIVE DEVICES UTILIZED TO PREVENT FALLS:  Life alert? No  Use of a cane, walker or w/c? No  Grab bars in the bathroom? No  Shower chair or bench in shower? No  Elevated toilet seat or a handicapped toilet? No   TIMED UP AND GO:  Was the test performed? No .  Cognitive Function:    08/23/2020    9:51 AM  MMSE - Mini Mental State Exam  Orientation to time 5  Orientation to Place 5  Registration 3  Attention/ Calculation 2  Recall 2  Language- name 2 objects 1  Language- repeat 1  Language- follow 3 step command 3  Language- read & follow direction 1  Write a sentence 1  Copy design 1  Total score 25        09/30/2022    8:12 AM 09/24/2021    8:12 AM 09/10/2020    9:03 AM  6CIT Screen  What Year? 0 points 0 points 0 points  What month? 0 points 0 points 0 points  What time? 0 points 0 points   Count back from 20 0 points 0 points 0 points  Months in reverse 2 points 0 points 0 points  Repeat phrase 4 points 4 points 0 points  Total Score 6 points 4 points     Immunizations Immunization History  Administered Date(s) Administered   Fluad Quad(high Dose 65+) 06/01/2019, 06/11/2020, 06/26/2021, 07/09/2022   Influenza Split 06/26/2011, 07/07/2012   Influenza Whole 07/09/1999, 07/01/2007, 06/27/2008, 07/11/2009, 06/28/2010   Influenza, High Dose Seasonal PF 07/13/2013, 06/26/2015, 07/15/2017, 06/21/2018   Influenza,inj,Quad PF,6+ Mos 06/22/2014   Influenza,inj,quad, With Preservative 06/01/2019   Influenza-Unspecified 08/15/2016   PFIZER(Purple Top)SARS-COV-2 Vaccination 10/14/2019, 11/05/2019   Pneumococcal Polysaccharide-23 12/20/2012, 11/04/2018   Td 09/23/2003   Tdap 06/04/2020   Zoster, Live 01/19/2013    TDAP status: Up to date  Flu Vaccine status: Up to date  Pneumococcal vaccine status: Due,  Education has been provided regarding the importance of this vaccine. Advised may receive this vaccine at local pharmacy or Health Dept. Aware to provide a copy of the vaccination record if obtained from local pharmacy or Health Dept. Verbalized acceptance and understanding.  Covid-19 vaccine status: Completed vaccines  Qualifies for Shingles Vaccine? Yes   Zostavax completed No   Shingrix Completed?: No.    Education has been provided regarding the importance of this vaccine. Patient has been advised to call insurance company to determine out of pocket expense if they have not yet received this vaccine. Advised may also receive vaccine at local pharmacy or Health Dept. Verbalized acceptance and understanding.  Screening Tests Health Maintenance  Topic Date Due   Zoster Vaccines- Shingrix (1 of 2) 10/09/2022 (Originally 12/16/1952)   Pneumonia Vaccine 66+ Years old (2 - PCV) 03/18/2023 (Originally 11/05/2019)   MAMMOGRAM  02/11/2023   Medicare Annual Wellness (AWV)  10/01/2023   DTaP/Tdap/Td (3 - Td or Tdap) 06/04/2030   INFLUENZA VACCINE  Completed   DEXA SCAN  Completed   HPV VACCINES  Aged Out   COVID-19 Vaccine  Discontinued    Health Maintenance  There are no preventive care reminders to display for this patient.   Colorectal cancer screening: No longer required.   Mammogram status: Completed 02/10/22. Repeat every year  Bone Density status: Completed 08/08/21. Results reflect: Bone density results: OSTEOPOROSIS. Repeat every 2 years.   Additional Screening:    Vision Screening: Recommended annual ophthalmology exams for early detection of glaucoma and other disorders of the eye. Is the patient up to date with their annual eye exam?  Yes  Who is the provider or what is the name of the office in which the patient attends annual eye exams? [facility]  If pt is not established with a provider, would they like to be referred to a provider to establish care? No .   Dental  Screening: Recommended annual dental exams for proper oral hygiene  Community Resource Referral / Chronic Care Management: CRR required this visit?  No   CCM required this visit?  No      Plan:     I have personally reviewed and noted the following in the patient's chart:   Medical and social history Use of alcohol, tobacco or illicit drugs  Current medications and supplements including opioid prescriptions. Patient is not currently taking opioid prescriptions. Functional ability and status Nutritional status Physical activity Advanced directives List of other physicians Hospitalizations, surgeries, and ER visits in previous 12 months Vitals Screenings to include cognitive, depression, and falls Referrals and appointments  In addition, I have reviewed and discussed with patient certain preventive protocols, quality metrics, and best practice recommendations. A written personalized care plan for preventive services as well as general preventive health recommendations were provided to patient.     Willette Brace, LPN   01/24/4919   Nurse Notes: none

## 2022-09-30 NOTE — Patient Instructions (Signed)
Ms. Cindy Schwartz , Thank you for taking time to come for your Medicare Wellness Visit. I appreciate your ongoing commitment to your health goals. Please review the following plan we discussed and let me know if I can assist you in the future.   These are the goals we discussed:  Goals      Patient Stated     Maintain independence- manage caregiver burden appropriately      Patient Stated     Increase exercise      Patient Stated     Work on balance and memory     Patient Stated     More faithful to exercise         This is a list of the screening recommended for you and due dates:  Health Maintenance  Topic Date Due   Zoster (Shingles) Vaccine (1 of 2) 10/09/2022*   Pneumonia Vaccine (2 - PCV) 03/18/2023*   Mammogram  02/11/2023   Medicare Annual Wellness Visit  10/01/2023   DTaP/Tdap/Td vaccine (3 - Td or Tdap) 06/04/2030   Flu Shot  Completed   DEXA scan (bone density measurement)  Completed   HPV Vaccine  Aged Out   COVID-19 Vaccine  Discontinued  *Topic was postponed. The date shown is not the original due date.    Advanced directives: Please bring a copy of your health care power of attorney and living will to the office at your convenience.  Conditions/risks identified: be more faithful to exercise   Next appointment: Follow up in one year for your annual wellness visit    Preventive Care 65 Years and Older, Female Preventive care refers to lifestyle choices and visits with your health care provider that can promote health and wellness. What does preventive care include? A yearly physical exam. This is also called an annual well check. Dental exams once or twice a year. Routine eye exams. Ask your health care provider how often you should have your eyes checked. Personal lifestyle choices, including: Daily care of your teeth and gums. Regular physical activity. Eating a healthy diet. Avoiding tobacco and drug use. Limiting alcohol use. Practicing safe  sex. Taking low-dose aspirin every day. Taking vitamin and mineral supplements as recommended by your health care provider. What happens during an annual well check? The services and screenings done by your health care provider during your annual well check will depend on your age, overall health, lifestyle risk factors, and family history of disease. Counseling  Your health care provider may ask you questions about your: Alcohol use. Tobacco use. Drug use. Emotional well-being. Home and relationship well-being. Sexual activity. Eating habits. History of falls. Memory and ability to understand (cognition). Work and work Statistician. Reproductive health. Screening  You may have the following tests or measurements: Height, weight, and BMI. Blood pressure. Lipid and cholesterol levels. These may be checked every 5 years, or more frequently if you are over 71 years old. Skin check. Lung cancer screening. You may have this screening every year starting at age 68 if you have a 30-pack-year history of smoking and currently smoke or have quit within the past 15 years. Fecal occult blood test (FOBT) of the stool. You may have this test every year starting at age 41. Flexible sigmoidoscopy or colonoscopy. You may have a sigmoidoscopy every 5 years or a colonoscopy every 10 years starting at age 42. Hepatitis C blood test. Hepatitis B blood test. Sexually transmitted disease (STD) testing. Diabetes screening. This is done by checking your blood  sugar (glucose) after you have not eaten for a while (fasting). You may have this done every 1-3 years. Bone density scan. This is done to screen for osteoporosis. You may have this done starting at age 63. Mammogram. This may be done every 1-2 years. Talk to your health care provider about how often you should have regular mammograms. Talk with your health care provider about your test results, treatment options, and if necessary, the need for more  tests. Vaccines  Your health care provider may recommend certain vaccines, such as: Influenza vaccine. This is recommended every year. Tetanus, diphtheria, and acellular pertussis (Tdap, Td) vaccine. You may need a Td booster every 10 years. Zoster vaccine. You may need this after age 44. Pneumococcal 13-valent conjugate (PCV13) vaccine. One dose is recommended after age 75. Pneumococcal polysaccharide (PPSV23) vaccine. One dose is recommended after age 42. Talk to your health care provider about which screenings and vaccines you need and how often you need them. This information is not intended to replace advice given to you by your health care provider. Make sure you discuss any questions you have with your health care provider. Document Released: 10/05/2015 Document Revised: 05/28/2016 Document Reviewed: 07/10/2015 Elsevier Interactive Patient Education  2017 [place] Prevention in the Home Falls can cause injuries. They can happen to people of all ages. There are many things you can do to make your home safe and to help prevent falls. What can I do on the outside of my home? Regularly fix the edges of walkways and driveways and fix any cracks. Remove anything that might make you trip as you walk through a door, such as a raised step or threshold. Trim any bushes or trees on the path to your home. Use bright outdoor lighting. Clear any walking paths of anything that might make someone trip, such as rocks or tools. Regularly check to see if handrails are loose or broken. Make sure that both sides of any steps have handrails. Any raised decks and porches should have guardrails on the edges. Have any leaves, snow, or ice cleared regularly. Use sand or salt on walking paths during winter. Clean up any spills in your garage right away. This includes oil or grease spills. What can I do in the bathroom? Use night lights. Install grab bars by the toilet and in the tub and shower.  Do not use towel bars as grab bars. Use non-skid mats or decals in the tub or shower. If you need to sit down in the shower, use a plastic, non-slip stool. Keep the floor dry. Clean up any water that spills on the floor as soon as it happens. Remove soap buildup in the tub or shower regularly. Attach bath mats securely with double-sided non-slip rug tape. Do not have throw rugs and other things on the floor that can make you trip. What can I do in the bedroom? Use night lights. Make sure that you have a light by your bed that is easy to reach. Do not use any sheets or blankets that are too big for your bed. They should not hang down onto the floor. Have a firm chair that has side arms. You can use this for support while you get dressed. Do not have throw rugs and other things on the floor that can make you trip. What can I do in the kitchen? Clean up any spills right away. Avoid walking on wet floors. Keep items that you use a lot in easy-to-reach  places. If you need to reach something above you, use a strong step stool that has a grab bar. Keep electrical cords out of the way. Do not use floor polish or wax that makes floors slippery. If you must use wax, use non-skid floor wax. Do not have throw rugs and other things on the floor that can make you trip. What can I do with my stairs? Do not leave any items on the stairs. Make sure that there are handrails on both sides of the stairs and use them. Fix handrails that are broken or loose. Make sure that handrails are as long as the stairways. Check any carpeting to make sure that it is firmly attached to the stairs. Fix any carpet that is loose or worn. Avoid having throw rugs at the top or bottom of the stairs. If you do have throw rugs, attach them to the floor with carpet tape. Make sure that you have a light switch at the top of the stairs and the bottom of the stairs. If you do not have them, ask someone to add them for you. What else  can I do to help prevent falls? Wear shoes that: Do not have high heels. Have rubber bottoms. Are comfortable and fit you well. Are closed at the toe. Do not wear sandals. If you use a stepladder: Make sure that it is fully opened. Do not climb a closed stepladder. Make sure that both sides of the stepladder are locked into place. Ask someone to hold it for you, if possible. Clearly mark and make sure that you can see: Any grab bars or handrails. First and last steps. Where the edge of each step is. Use tools that help you move around (mobility aids) if they are needed. These include: Canes. Walkers. Scooters. Crutches. Turn on the lights when you go into a dark area. Replace any light bulbs as soon as they burn out. Set up your furniture so you have a clear path. Avoid moving your furniture around. If any of your floors are uneven, fix them. If there are any pets around you, be aware of where they are. Review your medicines with your doctor. Some medicines can make you feel dizzy. This can increase your chance of falling. Ask your doctor what other things that you can do to help prevent falls. This information is not intended to replace advice given to you by your health care provider. Make sure you discuss any questions you have with your health care provider. Document Released: 07/05/2009 Document Revised: 02/14/2016 Document Reviewed: 10/13/2014 Elsevier Interactive Patient Education  2017 [employer].

## 2022-10-17 ENCOUNTER — Ambulatory Visit (INDEPENDENT_AMBULATORY_CARE_PROVIDER_SITE_OTHER): Payer: Medicare Other | Admitting: Orthopedic Surgery

## 2022-10-17 ENCOUNTER — Encounter: Payer: Self-pay | Admitting: Orthopedic Surgery

## 2022-10-17 ENCOUNTER — Ambulatory Visit (INDEPENDENT_AMBULATORY_CARE_PROVIDER_SITE_OTHER): Payer: Medicare Other

## 2022-10-17 VITALS — BP 137/71 | HR 75 | Ht 65.0 in | Wt 142.0 lb

## 2022-10-17 DIAGNOSIS — M545 Low back pain, unspecified: Secondary | ICD-10-CM

## 2022-10-17 NOTE — Progress Notes (Signed)
Orthopedic Spine Surgery Office Note  Assessment: Patient is a 89 y.o. female with low back pain that radiates into her bilateral lower extremities. MRI shows lateral recess stenosis at L2/3, central and lateral recess stenosis from L3-S1.    Plan: -Explained that initially conservative treatment is tried as a significant number of patients may experience relief with these treatment modalities. Discussed that the conservative treatments include:  -activity modification  -physical therapy  -over the counter pain medications  -lumbar steroid injections -Patient has tried advil, PT -Has not done home exercises, recommended she do those 3-4 times per week  -Recommended PT work on core strengthening and Williams flexion exercises -Continue to use Advil as needed -Patient should return to office on an as-needed basis   Patient expressed understanding of the plan and all questions were answered to the patient's satisfaction.   ___________________________________________________________________________   History:  Patient is a 89 y.o. female who presents today for lumbar spine.  Patient has now had several months of low back pain that radiates into her bilateral lower extremities.  She first felt it in her lower back and then she started to feel it in her buttock bilaterally.  It now radiates down the lateral aspect of the thighs and into the lateral aspect of the legs.  Generally, it is worse with activity but she has noticed it too when she is seated.  She does not know if flexion of the lumbar spine improves her pain.  She has not particularly noticed that to be helpful.  There was no trauma or injury that preceded her pain.  Denies paresthesias and numbness.   Weakness: Denies Symptoms of imbalance: Denies Paresthesias and numbness: Denies Bowel or bladder incontinence: Denies Saddle anesthesia: Denies  Treatments tried: PT, Advil  Review of systems: Denies fevers and chills, night  sweats, unexplained weight loss, pain that wakes them at night. Has history of cancer as noted below.   Past medical history: Raynaud's disease AAA Breast cancer Osteoarthritis Ovarian cancer GERD Insomnia Osteoporosis  Allergies: Prednisone, chlorhexidine  Past surgical history:  Hysterectomy Breast lumpectomy Salpingo-oophorectomy Appendectomy Right mastectomy  Social history: Denies use of nicotine product (smoking, vaping, patches, smokeless) Alcohol use: denies Denies recreational drug use   Physical Exam:  General: no acute distress, appears stated age Neurologic: alert, answering questions appropriately, following commands Respiratory: unlabored breathing on room air, symmetric chest rise Psychiatric: appropriate affect, normal cadence to speech   MSK (spine):  -Strength exam      Left  Right EHL    5/5  5/5 TA    5/5  5/5 GSC    5/5  5/5 Knee extension  5/5  5/5 Hip flexion   5/5  5/5  -Sensory exam    Sensation intact to light touch in L3-S1 nerve distributions of bilateral lower extremities  -Achilles DTR: 1/4 on the left, 1/4 on the right -Patellar tendon DTR: 1/4 on the left, 1/4 on the right  -Straight leg raise: Negative -Contralateral straight leg raise: Negative -Femoral nerve stretch test: Negative -Clonus: no beats bilaterally  -Left hip exam: No pain through range of motion, negative Stinchfield, negative Faber -Right hip exam: No pain to range of motion, negative Stinchfield, negative FABER  Imaging: XR of the lumbar spine from 10/17/2022 was independently reviewed and interpreted, showing old compression fracture at L1.  L2/3 disc height loss.  No new fractures seen.  No evidence of instability on flexion/extension views.  Degenerative changes with joint space narrowing and subchondral sclerosis  seen in the left hip.  MRI of the lumbar spine from 06/16/2022 was independently reviewed and interpreted, showing old compression fracture  at L1. Lateral recess stenosis at L2/3. Central and lateral recess stenosis at L3/4, L4/5, and L5/S1.    Patient name: Cindy Schwartz Patient MRN: [id number] Date of visit: 10/17/22

## 2022-10-20 ENCOUNTER — Ambulatory Visit (INDEPENDENT_AMBULATORY_CARE_PROVIDER_SITE_OTHER): Payer: Medicare Other | Admitting: Podiatry

## 2022-10-20 VITALS — BP 137/81

## 2022-10-20 DIAGNOSIS — M79675 Pain in left toe(s): Secondary | ICD-10-CM

## 2022-10-20 DIAGNOSIS — M79671 Pain in right foot: Secondary | ICD-10-CM

## 2022-10-20 DIAGNOSIS — B351 Tinea unguium: Secondary | ICD-10-CM | POA: Diagnosis not present

## 2022-10-20 DIAGNOSIS — M79674 Pain in right toe(s): Secondary | ICD-10-CM

## 2022-10-20 DIAGNOSIS — Q828 Other specified congenital malformations of skin: Secondary | ICD-10-CM

## 2022-10-20 NOTE — Progress Notes (Signed)
  Subjective:  Patient ID: Cindy Schwartz, female    DOB: 1934-03-12,  MRN: [id number]  Cindy Schwartz presents to clinic today for {jgcomplaint:23593}  Chief Complaint  Patient presents with   Nail Problem    [facility] PCP-Stephen Hunter PCP VST-06/2022   New problem(s): None.   PCP is Marin Olp, MD.  Allergies  Allergen Reactions   Prednisone     Fatigue and depressed mood on 7 day '40mg'$  x 3 days, 20 mg x 4 days from Dr. Yong Channel- intolerance   Chlorhexidine Gluconate Rash    Review of Systems: Negative except as noted in the HPI.  Objective: No changes noted in today's physical examination. Vitals:   10/20/22 0900  BP: 137/81   Cindy Schwartz is a pleasant 89 y.o. female WD, WN in NAD. AAO x 3.  Vascular Examination: CFT <3 seconds b/l LE. Palpable DP pulse(s) b/l LE. Palpable PT pulse(s) b/l LE. Pedal hair sparse. No pain with calf compression b/l. No edema noted b/l LE. Varicosities present b/l. No cyanosis or clubbing noted b/l LE.Marland Kitchen  Dermatological Examination: Pedal skin thin and atrophic b/l LE. No open wounds b/l LE. No interdigital macerations noted b/l LE. Toenails 1-5 b/l elongated, discolored, dystrophic, thickened, crumbly with subungual debris and tenderness to dorsal palpation.   Incurvated nailplate both borders of left hallux and both borders of right hallux.  Nail border hypertrophy absent. There is tenderness to palpation. Sign(s) of infection: no clinical signs of infection noted on examination today.   Porokeratotic lesion(s) submet head 2 b/l. No erythema, no edema, no drainage, no fluctuance.  Neurological Examination: Protective sensation intact with 10 gram monofilament b/l LE. Vibratory sensation intact b/l LE.   Musculoskeletal Examination: Muscle strength 5/5 to all LE muscle groups b/l. Plantarflexed metatarsal(s) 2nd metatarsal head b/l lower extremities. Assessment/Plan: 1. Pain due to onychomycosis of toenails of both feet   2. Porokeratosis   3.  Pain in both feet     No orders of the defined types were placed in this encounter.   None {Jgplan:23602::"-Patient/POA to call should there be question/concern in the interim."}   Return in about 3 months (around 01/19/2023).  Marzetta Board, DPM

## 2022-10-21 ENCOUNTER — Encounter: Payer: Self-pay | Admitting: Podiatry

## 2022-10-30 ENCOUNTER — Other Ambulatory Visit: Payer: Self-pay | Admitting: General Surgery

## 2022-10-30 DIAGNOSIS — Z1231 Encounter for screening mammogram for malignant neoplasm of breast: Secondary | ICD-10-CM

## 2022-11-03 ENCOUNTER — Ambulatory Visit: Payer: Medicare Other | Admitting: Gastroenterology

## 2022-11-06 ENCOUNTER — Ambulatory Visit (INDEPENDENT_AMBULATORY_CARE_PROVIDER_SITE_OTHER): Payer: Medicare Other | Admitting: Family Medicine

## 2022-11-06 ENCOUNTER — Encounter: Payer: Self-pay | Admitting: Family Medicine

## 2022-11-06 VITALS — BP 138/78 | HR 91 | Temp 98.3°F | Ht 65.0 in | Wt 139.0 lb

## 2022-11-06 DIAGNOSIS — K219 Gastro-esophageal reflux disease without esophagitis: Secondary | ICD-10-CM

## 2022-11-06 DIAGNOSIS — K581 Irritable bowel syndrome with constipation: Secondary | ICD-10-CM | POA: Diagnosis not present

## 2022-11-06 DIAGNOSIS — R197 Diarrhea, unspecified: Secondary | ICD-10-CM | POA: Diagnosis not present

## 2022-11-06 DIAGNOSIS — R1030 Lower abdominal pain, unspecified: Secondary | ICD-10-CM

## 2022-11-06 LAB — CBC WITH DIFFERENTIAL/PLATELET
Basophils Absolute: 0 10*3/uL (ref 0.0–0.1)
Basophils Relative: 0.6 % (ref 0.0–3.0)
Eosinophils Absolute: 0.3 10*3/uL (ref 0.0–0.7)
Eosinophils Relative: 4.4 % (ref 0.0–5.0)
HCT: 36.5 % (ref 36.0–46.0)
Hemoglobin: 12.3 g/dL (ref 12.0–15.0)
Lymphocytes Relative: 18.5 % (ref 12.0–46.0)
Lymphs Abs: 1.3 10*3/uL (ref 0.7–4.0)
MCHC: 33.7 g/dL (ref 30.0–36.0)
MCV: 84.3 fl (ref 78.0–100.0)
Monocytes Absolute: 1 10*3/uL (ref 0.1–1.0)
Monocytes Relative: 14.3 % — ABNORMAL HIGH (ref 3.0–12.0)
Neutro Abs: 4.2 10*3/uL (ref 1.4–7.7)
Neutrophils Relative %: 62.2 % (ref 43.0–77.0)
Platelets: 233 10*3/uL (ref 150.0–400.0)
RBC: 4.34 Mil/uL (ref 3.87–5.11)
RDW: 14.5 % (ref 11.5–15.5)
WBC: 6.8 10*3/uL (ref 4.0–10.5)

## 2022-11-06 LAB — COMPREHENSIVE METABOLIC PANEL
ALT: 16 U/L (ref 0–35)
AST: 24 U/L (ref 0–37)
Albumin: 3.7 g/dL (ref 3.5–5.2)
Alkaline Phosphatase: 60 U/L (ref 39–117)
BUN: 13 mg/dL (ref 6–23)
CO2: 28 mEq/L (ref 19–32)
Calcium: 9.5 mg/dL (ref 8.4–10.5)
Chloride: 97 mEq/L (ref 96–112)
Creatinine, Ser: 0.6 mg/dL (ref 0.40–1.20)
GFR: 81.01 mL/min (ref >60.00)
Glucose, Bld: 83 mg/dL (ref 70–99)
Potassium: 4 mEq/L (ref 3.5–5.1)
Sodium: 132 mEq/L — ABNORMAL LOW (ref 135–145)
Total Bilirubin: 0.4 mg/dL (ref 0.2–1.2)
Total Protein: 6.4 g/dL (ref 6.0–8.3)

## 2022-11-06 LAB — TSH: TSH: 1.01 u[IU]/mL (ref 0.35–5.50)

## 2022-11-06 LAB — SEDIMENTATION RATE: Sed Rate: 26 mm/hr (ref 0–30)

## 2022-11-06 NOTE — Patient Instructions (Addendum)
Please follow up if symptoms do not improve or as needed.   Please hold your magnesium until your diarrhea improves. Please start OTC immodium to slow down the diarrhea. Stay hydrated   I will release your lab results to you on your MyChart account with further instructions. You may see the results before I do, but when I review them I will send you a message with my report or have my assistant call you if things need to be discussed. Please reply to my message with any questions. Thank you!   Diarrhea, Adult Diarrhea is frequent loose and sometimes watery bowel movements. Diarrhea can make you feel weak and cause you to become dehydrated. Dehydration is a condition in which there is not enough water or other fluids in the body. Dehydration can make you tired and thirsty, cause you to have a dry mouth, and decrease how often you urinate. Diarrhea typically lasts 2-3 days. However, it can last longer if it is a sign of something more serious. It is important to treat your diarrhea as told by your health care provider. Follow these instructions at home: Eating and drinking     Follow these recommendations as told by your health care provider: Take an oral rehydration solution (ORS). This is an over-the-counter medicine that helps return your body to its normal balance of nutrients and water. It is found at pharmacies and retail stores. Drink enough fluid to keep your urine pale yellow. Drink fluids such as water, diluted fruit juice, and low-calorie sports drinks. You can drink milk also, if desired. Sucking on ice chips is another way to get fluids. Avoid drinking fluids that contain a lot of sugar or caffeine, such as soda, energy drinks, and regular sports drinks. Avoid alcohol. Eat bland, easy-to-digest foods in small amounts as you are able. These foods include bananas, applesauce, rice, lean meats, toast, and crackers. Avoid spicy or fatty foods.  Medicines Take over-the-counter and  prescription medicines only as told by your health care provider. If you were prescribed antibiotics, take them as told by your health care provider. Do not stop using the antibiotic even if you start to feel better. General instructions  Wash your hands often using soap and water for at least 20 seconds. If soap and water are not available, use hand sanitizer. Others in the household should wash their hands as well. Hands should be washed: After using the toilet or changing a diaper. Before preparing, cooking, or serving food. While caring for a sick person or while visiting someone in a hospital. Rest at home while you recover. Take a warm bath to relieve any burning or pain from frequent diarrhea episodes. Watch your condition for any changes. Contact a health care provider if: You have a fever. Your diarrhea gets worse. You have new symptoms. You vomit every time you eat or drink. You feel light-headed, dizzy, or have a headache. You have muscle cramps. You have signs of dehydration, such as: Dark urine, very little urine, or no urine. Cracked lips. Dry mouth. Sunken eyes. Sleepiness. Weakness. You have bloody or black stools or stools that look like tar. You have severe pain, cramping, or bloating in your abdomen. Your skin feels cold and clammy. You feel confused. Get help right away if: You have chest pain or your heart is beating very quickly. You have trouble breathing or you are breathing very quickly. You feel extremely weak or you faint. These symptoms may be an emergency. Get help right away.  Call 911. Do not wait to see if the symptoms will go away. Do not drive yourself to the hospital. This information is not intended to replace advice given to you by your health care provider. Make sure you discuss any questions you have with your health care provider. Document Revised: 02/25/2022 Document Reviewed: 02/25/2022 Elsevier Patient Education  [place].

## 2022-11-06 NOTE — Progress Notes (Signed)
Subjective  CC:  Chief Complaint  Patient presents with   Diarrhea    Pt stated that she has been having diarrhea for the past 4 days and she is getting weak bc she does not have food on her stomach.     HPI: Cindy Schwartz is a 89 y.o. female who presents to the office today to address the problems listed above in the chief complaint. 89 yo w/ h/o IBS presents with 4 days of loose stools, postprandial but also without eating. Loose brown stool, particulate w/o mucous or blood. Some abdominal cramping mild w/o fevers, chills or loss of appetite. No n/v. No sick contacts. No recent travel. No urinary sxs. Feels tires. No lightheadedness or cp.  I reviewed notes from GI regarding her abdominal bloating and IBS. Last colonoscopy: 2021 w/ dr. Cristina Gong. No mention of h/o diverticulitis by chart review     Assessment  1. Diarrhea, unspecified type   2. Irritable bowel syndrome with constipation   3. Gastroesophageal reflux disease without esophagitis   4. Lower abdominal pain      Plan  diarrhea:  diff dx: infectious, inflammatory, IBS flare or other. She is nontoxic w/o significant sxs of dehydration. Will start work up with labs and stool studies, support with immodium and oral hydration and monitor. If not improving, rec CT scan and/or GI. If elevated WBC can consider covering for diverticulitis. Counseling and education given.   Follow up: prn  01/08/2023  Orders Placed This Encounter  Procedures   Clostridium difficile Toxin B, Qualitative, Real-Time PCR   ISOLATE REFERRAL PH   CBC with Differential/Platelet   Comprehensive metabolic panel   Sedimentation rate   TSH   Gastrointestinal Pathogen Pnl RT, PCR   No orders of the defined types were placed in this encounter.     I reviewed the patients updated PMH, FH, and SocHx.    Patient Active Problem List   Diagnosis Date Noted   Bloating 07/31/2022   Left sided abdominal pain 07/31/2022   Belching 07/31/2022   COPD  (chronic obstructive pulmonary disease) ([place]) 03/17/2022   Osteoarthritis of carpometacarpal (CMC) joints of both thumbs 09/26/2021   Pain due to onychomycosis of toenails of both feet 12/25/2020   History of cerebellar stroke 10/05/2020   Memory loss 10/05/2020   Osteoarthritis of both hands 08/29/2020   Bilateral carpal tunnel syndrome 08/29/2020   Trigger thumb of left hand 08/29/2020   History of adenomatous polyp of colon 12/30/2019   Mild hyperlipidemia 06/01/2019   Degenerative arthritis of knee, bilateral 04/08/2019   Chronic idiopathic constipation 03/01/2019   Chronic cough 01/21/2018   Genetic testing 07/09/2017   history of Breast cancer of lower-outer quadrant of right female breast  01/16/2016   Palpitations 11/13/2014   Normal coronary arteries 10/17/2014   Varicose veins of lower extremities with other complications [id number]   Hx of herpes simplex infection 12/18/2011   Low back pain 06/28/2010   Lower abdominal pain 06/28/2010   Generalized anxiety disorder 10/13/2008   Irritable bowel syndrome 10/13/2008   Lung nodule 11/25/2007   Raynaud's syndrome 07/01/2007   Aneurysm of thoracic aorta ([place]) 05/06/2007   Allergic rhinitis 05/06/2007   Esophageal reflux 05/06/2007   DEGENERATIVE DISC DISEASE, CERVICAL SPINE 05/06/2007   Osteoporosis 03/18/2007   History of ovarian cancer 09/22/1977   Current Meds  Medication Sig   Ascorbic Acid (VITAMIN C) 1000 MG tablet Take 1,000 mg by mouth daily.   aspirin EC (ASPIRIN LOW DOSE) 81  MG tablet TAKE 1 TABLET DAILY (SWALLOW WHOLE)   azelastine (ASTELIN) 0.1 % nasal spray Place 2 sprays into both nostrils 2 (two) times daily.   Cholecalciferol 25 MCG (1000 UT) capsule Take by mouth.   COLLAGEN PO Take by mouth.   esomeprazole (NEXIUM) 20 MG capsule Take 1 capsule (20 mg total) by mouth daily.   MAGNESIUM PO Take 300 mg by mouth.   rosuvastatin (CRESTOR) 5 MG tablet Take 1 tablet (5 mg total) by mouth daily. Take 1 tablet  daily by mouth.   valACYclovir (VALTREX) 500 MG tablet Take 1 tablet (500 mg total) by mouth 2 (two) times a week.    Allergies: Patient is allergic to prednisone and chlorhexidine gluconate. Family History: Patient family history includes Breast cancer (age of onset: 60) in her cousin; Colon polyps in her brother; Esophageal cancer (age of onset: 34) in her cousin; Healthy in her brother; Hip fracture in her mother; Other in her father. Social History:  Patient  reports that she has never smoked. She has never used smokeless tobacco. She reports that she does not drink alcohol and does not use drugs.  Review of Systems: Constitutional: Negative for fever malaise or anorexia Cardiovascular: negative for chest pain Respiratory: negative for SOB or persistent cough Gastrointestinal: negative for abdominal pain  Objective  Vitals: BP 138/78   Pulse 91   Temp 98.3 F (36.8 C)   Ht 5' 5"$  (1.651 m)   Wt 139 lb (63 kg)   LMP  (LMP Unknown)   SpO2 99%   BMI 23.13 kg/m  General: non-toxic appearing, A&Ox3 HEENT: PEERL, conjunctiva normal, neck is supple Cardiovascular:  RRR without murmur or gallop.  Respiratory:  Good breath sounds bilaterally, CTAB with normal respiratory effort Gastrointestinal: soft, flat abdomen, normal active bowel sounds, no palpable masses, no hepatosplenomegaly, no appreciated hernias Mild ttp in lower abd bilaterally. No rebound or guarding.  Skin:  Warm, no rashes  Commons side effects, risks, benefits, and alternatives for medications and treatment plan prescribed today were discussed, and the patient expressed understanding of the given instructions. Patient is instructed to call or message via MyChart if he/she has any questions or concerns regarding our treatment plan. No barriers to understanding were identified. We discussed Red Flag symptoms and signs in detail. Patient expressed understanding regarding what to do in case of urgent or emergency type  symptoms.  Medication list was reconciled, printed and provided to the patient in AVS. Patient instructions and summary information was reviewed with the patient as documented in the AVS. This note was prepared with assistance of Dragon voice recognition software. Occasional wrong-word or sound-a-like substitutions may have occurred due to the inherent limitations of voice recognition software

## 2022-11-08 ENCOUNTER — Telehealth: Payer: Self-pay | Admitting: Internal Medicine

## 2022-11-08 NOTE — Telephone Encounter (Signed)
Phone call from [facility] called in a positive Cambylobacter on the stool culture  I asked her to triage the patient now--and have her go to the ER for any ongoing pain, persistent diarrhea or other worrisome symptoms

## 2022-11-10 NOTE — Telephone Encounter (Signed)
LVM for pt to cb to the office to let us know how she is doing.

## 2022-11-10 NOTE — Telephone Encounter (Signed)
Does not appear she went to the emergency department at least with [facility] (per my review) can we call and check on her?

## 2022-11-10 NOTE — Telephone Encounter (Signed)
Pt was advised to go to ED.  Patient Name: Cindy Schwartz Gender: Female DOB: 1934-02-29 Age: 89 Y 10 M 22 D Return Phone Number: [phone] (Primary) Address: City/ State/ Zip: [place]  [address] Client [place] [facility] [place] Client Site [place] [facility] Night Provider Billey Chang- MD Contact Type Call Who Is Calling Lab / Radiology Lab Name [facility] Lab Phone Number [phone] Lab Tech Name [facility] Reference Number [id number] Y Chief Complaint Lab Result (Critical or Stat) Call Type Lab Send to RN Reason for Call Report lab results Initial Comment Caller states they have urgent test results. It was faxed over and pushed electronically. Translation No Nurse Assessment Nurse: Meda Coffee, RN, Maggie Font Date/Time Eilene Ghazi Time): 11/08/2022 3:17:25 PM Is there an on-call provider listed? ---Yes Please list name of person reporting value (Lab Employee) and a contact number. ---Beau Fanny [phone] Please document the following items: Lab name Lab value (read back to lab to verify) Reference range for lab value Date and time blood was drawn Collect time of birth for bilirubin results ---Collection Date: 11/06/22 at 341pm Campolobactor group resulted as detected (stool) Please collect the patient contact information from the lab. (name, phone number and address) ---[phone] Disp. Time Eilene Ghazi Time) Disposition Final User 11/08/2022 3:22:27 PM Paged On Call back to [facility], RN, Maggie Font 11/08/2022 3:29:31 PM Lab Call Meda Coffee, RN, Maggie Font Reason: Beau Fanny with [facility] 11/08/2022 3:33:49 PM Attempt made - message left Meda Coffee RN, Maggie Font 11/08/2022 3:54:37 PM FINAL ATTEMPT MADE - message left Yes Meda Coffee RN, Maggie Font Final Disposition 11/08/2022 3:54:37 PM FINAL ATTEMPT MADE - message left Yes Meda Coffee, RN, Maggie Font Comments User: Janalyn Rouse, RN Date/Time Eilene Ghazi Time): 11/08/2022 3:54:24  PM unable to reach pt on multiple attempts but did leave VM with MD instructions for pt Paging DoctorName Phone DateTime Result/ Outcome Message Type Notes Viviana Simpler- MD [phone] 11/08/2022 3:22:27 PM Paged On Call Back to Call Center Doctor Paged Urgent lab results called in and needing to report. Pleaes call nurse back at [phone]. Thank you, Maggie Font RN Viviana Simpler- MD 11/08/2022 3:30:23 PM Spoke with On Call - General Message Result lab results given on oncall MD and if not feeling well or if diarrhea has continued pt should go to the ER

## 2022-11-11 LAB — GASTROINTESTINAL PATHOGEN PNL
CampyloBacter Group: DETECTED — AB
Norovirus GI/GII: NOT DETECTED
Rotavirus A: NOT DETECTED
Salmonella species: NOT DETECTED
Shiga Toxin 1: NOT DETECTED
Shiga Toxin 2: NOT DETECTED
Shigella Species: NOT DETECTED
Vibrio Group: NOT DETECTED
Yersinia enterocolitica: NOT DETECTED

## 2022-11-11 LAB — CLOSTRIDIUM DIFFICILE TOXIN B, QUALITATIVE, REAL-TIME PCR: Toxigenic C. Difficile by PCR: NOT DETECTED

## 2022-11-11 LAB — ISOLATE REFERRAL PH
MICRO NUMBER:: 14580979
SPECIMEN QUALITY:: ADEQUATE

## 2022-11-11 MED ORDER — AZITHROMYCIN 500 MG PO TABS: ORAL_TABLET | ORAL | 0 refills | Status: DC

## 2022-11-11 NOTE — Telephone Encounter (Signed)
Please call pt back with lab results

## 2022-11-11 NOTE — Telephone Encounter (Signed)
Unable to reach pt

## 2022-11-11 NOTE — Telephone Encounter (Signed)
Pt returned call state she has gotten a call from the health department and she is still experiencing diarrhea and constipation. This morning and last night she took magnesium and miralax, she feels bloated and is still having cramps and wants to know if you want her to f/u with you regarding this.

## 2022-11-11 NOTE — Addendum Note (Signed)
Addended by: Marin Olp on: 11/11/2022 09:08 PM   Modules accepted: Orders

## 2022-11-12 ENCOUNTER — Encounter: Payer: Self-pay | Admitting: Family Medicine

## 2022-11-12 NOTE — Telephone Encounter (Signed)
Called and lm on pt vm with below message per pt request.

## 2022-11-21 ENCOUNTER — Telehealth: Payer: Self-pay | Admitting: Orthopedic Surgery

## 2022-11-21 DIAGNOSIS — M5416 Radiculopathy, lumbar region: Secondary | ICD-10-CM

## 2022-11-21 NOTE — Telephone Encounter (Signed)
Patient called advised she has pain in her back and it travels down to her calf. Patient asked what does Dr. Laurance Schwartz want her to do? The number to contact patient is  [phone]

## 2022-11-24 NOTE — Addendum Note (Signed)
Addended by: Elvin So L on: 11/24/2022 02:00 PM   Modules accepted: Orders

## 2022-11-24 NOTE — Addendum Note (Signed)
Addended by: Ileene Rubens on: 11/24/2022 01:49 PM   Modules accepted: Orders

## 2022-11-24 NOTE — Telephone Encounter (Signed)
Called and advised pt.

## 2022-12-02 NOTE — Therapy (Signed)
OUTPATIENT PHYSICAL THERAPY THORACOLUMBAR EVALUATION   Patient Name: Cindy Schwartz MRN: [id number] DOB:September 05, 1934, 89 y.o., female Today's Date: 12/05/2022  END OF SESSION:  PT End of Session - 12/05/22 1014     Visit Number 1    Number of Visits 12    Date for PT Re-Evaluation 01/23/23    Authorization Type [employer]ICARE    Progress Note Due on Visit 10    PT Start Time 0930    PT Stop Time 1015    PT Time Calculation (min) 45 min    Activity Tolerance Patient tolerated treatment well    Behavior During Therapy WFL for tasks assessed/performed             Past Medical History:  Diagnosis Date   Aneurysm of ascending aorta ([place])    a. 4.2-4.3cm in 2014.   Anxiety    Arthritis    Breast cancer ([place]) 2018   right mastectomy   Cancer ([place]) 1980   ovarian cancer   Compression fracture of L1 lumbar vertebra ([place])    COMPRESSION FRACTURE, THORACIC VERTEBRA 07/24/2010   Qualifier: Diagnosis of  By: [name], [name]    Elevated BP Transient 09/2014   Genetic testing 07/09/2017   Multi-Cancer panel (83 genes) @ [facility] - No pathogenic mutations detected   GERD (gastroesophageal reflux disease)    H/O bone density study    2015   H/O colonoscopy    History of ovarian cancer    November 1979   History of ovarian cancer 1979   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   Insomnia    Irregular heart beat    Normal coronary arteries Cath - 09/2014   Osteoporosis    Raynaud's disease    Past Surgical History:  Procedure Laterality Date   ABDOMINAL HYSTERECTOMY  1979   APPENDECTOMY     BILATERAL SALPINGOOPHORECTOMY     BREAST LUMPECTOMY     COLONOSCOPY  02/2012   LEFT HEART CATHETERIZATION WITH CORONARY ANGIOGRAM N/A 10/17/2014   Procedure: LEFT HEART CATHETERIZATION WITH CORONARY ANGIOGRAM;  Surgeon: [name], MD;  Location: [facility] C[facility] LAB;  Service: Cardiovascular;  Laterality: N/A;   ovarian ca s/p taj/bso  1979   SIMPLE MASTECTOMY  WITH AXILLARY SENTINEL NODE BIOPSY Right 05/05/2017   Procedure: RIGHT TOTAL MASTECTOMY WITH RIGHT  AXILLARY SENTINEL NODE BIOPSY;  Surgeon: [name], MD;  Location: [place];  Service: General;  Laterality: Right;  PECTORAL BLOCK   Patient Active Problem List   Diagnosis Date Noted   Bloating 07/31/2022   Left sided abdominal pain 07/31/2022   Belching 07/31/2022   COPD (chronic obstructive pulmonary disease) ([place]) 03/17/2022   Osteoarthritis of carpometacarpal (CMC) joints of both thumbs 09/26/2021   Pain due to onychomycosis of toenails of both feet 12/25/2020   History of cerebellar stroke 10/05/2020   Memory loss 10/05/2020   Osteoarthritis of both hands 08/29/2020   Bilateral carpal tunnel syndrome 08/29/2020   Trigger thumb of left hand 08/29/2020   History of adenomatous polyp of colon 12/30/2019   Mild hyperlipidemia 06/01/2019   Degenerative arthritis of knee, bilateral 04/08/2019   Chronic idiopathic constipation 03/01/2019   Chronic cough 01/21/2018   Genetic testing 07/09/2017   history of Breast cancer of lower-outer quadrant of right female breast  01/16/2016   Palpitations 11/13/2014   Normal coronary arteries 10/17/2014   Varicose veins of lower extremities with other complications [id number]   Hx of herpes simplex infection 12/18/2011  Low back pain 06/28/2010   Lower abdominal pain 06/28/2010   Generalized anxiety disorder 10/13/2008   Irritable bowel syndrome 10/13/2008   Lung nodule 11/25/2007   Raynaud's syndrome 07/01/2007   Aneurysm of thoracic aorta ([place]) 05/06/2007   Allergic rhinitis 05/06/2007   Esophageal reflux 05/06/2007   DEGENERATIVE [place] DISEASE, CERVICAL SPINE 05/06/2007   Osteoporosis 03/18/2007   History of ovarian cancer 09/22/1977    PCP: Marin Olp, MD  REFERRING PROVIDER: Callie Fielding, MD  REFERRING DIAG: Radiculopathy, lumbar region [M54.16]   Rationale for Evaluation and Treatment: Rehabilitation  THERAPY  DIAG:  Muscle weakness (generalized)  Other low back pain  Radiculopathy, lumbar region  Abnormal posture  Pain in right lower leg  ONSET DATE: Ongoing for over a year.   SUBJECTIVE:                                                                                                                                                                                           SUBJECTIVE STATEMENT: Cindy Schwartz notes low back, R hip and R>L LE pain to the distal calf primarily in the morning. Symptoms are also noted with prolonged sitting.    PERTINENT HISTORY:  Cervical, knees and hands OA, osteoporosis, previous breast cancer, L1 and thoracic compression fractures   PAIN:  Are you having pain? Yes: NPRS scale: 5-6/10 Pain location: R LE from buttocks that wraps around to anterior thigh and lateral calf.  Pain description: Sharp pain with tingling into R LE Aggravating factors: Getting in/ out of bed, lifting leg.  Relieving factors: Rubbing her thigh.   PRECAUTIONS: Back  WEIGHT BEARING RESTRICTIONS: No  FALLS:  Has patient fallen in last 6 months? No  LIVING ENVIRONMENT: Lives with: lives alone Lives in: House/apartment Stairs:  No problem with stairs Has following equipment at home: None  OCCUPATION: Retired  PLOF: Independent  PATIENT GOALS: Be able to get up and moving in the morning quickly without pain.   OBJECTIVE:   DIAGNOSTIC FINDINGS:  No recent fracture is seen. Decrease in height of bodies of T12 and L1 vertebrae has not changed. Degenerative are noted in the lumbar spine, more so at L2-L3 and L5-S1 levels with interval progression.  PATIENT SURVEYS:  FOTO 56.31%, 64% in 11 visits.   SCREENING FOR RED FLAGS: Bowel or bladder incontinence: No Spinal tumors: No Cauda equina syndrome: No Compression fracture: Yes: Previous thoracic and lumbar Abdominal aneurysm: Yes: Previous  COGNITION: Overall cognitive status: Within functional limits for tasks  assessed     SENSATION: Not tested  POSTURE: Mildly rounded shoulders, forward head and decreased lumbar lordosis   PALPATION:  No tenderness, but tightness noted in R L3-L5 lumbar paraspinals and piriformis.   LUMBAR ROM:   AROM eval  Flexion 50%   Extension 50%   Right lateral flexion 100%   Left lateral flexion 100%   Right rotation 100%   Left rotation 100%    (Blank rows = not tested)  LOWER EXTREMITY ROM:     Active  Right eval Left eval  Hip flexion [facility]  Hip internal rotation [facility]  Hip external rotation [facility]  Knee flexion [facility] WFL  Knee extension WFL WFL   (Blank rows = not tested)  LOWER EXTREMITY MMT:    MMT Right eval Left eval  Hip flexion 4- 4  Knee flexion 4- 4  Knee extension 4- 4   (Blank rows = not tested)  LUMBAR SPECIAL TESTS:  Straight leg raise test: Positive   GAIT: Distance walked: walked independently in the clinic 28ft Assistive device utilized: None Level of assistance: Complete Independence Comments: No abnormalities noted.   TODAY'S TREATMENT:                                                                                                                              DATE: Creating, reviewing, and completing below HEP   Trigger Point Dry-Needling  Treatment instructions: Expect mild to moderate muscle soreness. S/S of pneumothorax if dry needled over a lung field, and to seek immediate medical attention should they occur. Patient verbalized understanding of these instructions and education.  Patient Consent Given: Yes Education handout provided: No Muscles treated: R L3-L5 lumbar paraspinals and piriformis.  Electrical stimulation performed: Yes Parameters:  Mod intensity, low amplitude.  Treatment response/outcome: Decreased pain.     PATIENT EDUCATION:  Education details: Educated pt on anatomy and physiology of current symptoms, FOTO, diagnosis, prognosis, HEP,  and POC. Person educated: Patient Education  method: Customer service manager Education comprehension: verbalized understanding and returned demonstration  HOME EXERCISE PROGRAM: Access Code: [email or web address] URL: [email or web address] Date: 12/05/2022 Prepared by: Rudi Heap   Exercises - Standing Lumbar Extension at [place]  - 5 x daily - 7 x weekly - 1 sets - 5 reps - 3 seconds hold - Supine Lower Trunk Rotation  - 1-2 x daily - 1-7 x weekly - 1 sets - 5 reps - 10 seconds hold - Supine Figure 4 Piriformis Stretch  - 1-2 x daily - 7 x weekly - 1 sets - 4-5 reps - 20 seconds hold - Seated Table Hamstring Stretch  - 1-2 x daily - 7 x weekly - 1 sets - 4-5 reps - 20 seconds hold - Modified Thomas Stretch  - 1-2 x daily - 7 x weekly - 1 sets - 4-5 reps - 20 seconds hold - Standing Scapular Retraction  - 5 x daily - 7 x weekly - 1 sets - 5 reps - 5 second hold   ASSESSMENT:  CLINICAL IMPRESSION: Patient referred to PT for radiculopathy  of lumbar into R LE. She states that she has had this issue ongoing for over a year and continues to perform stretches in the morning. Pt denies compliance with HEP beyond morning stretches. She requested TPDN today and responded well with no adverse effects. Pt was encouraged to use heat for any residual discomfort today and to drink increased water for muscle cramps. Patient will benefit from skilled PT to address below impairments, limitations and improve overall function.  OBJECTIVE IMPAIRMENTS: decreased activity tolerance, difficulty walking, decreased balance, decreased endurance, decreased mobility, decreased ROM, decreased strength, impaired flexibility, impaired UE/LE use, postural dysfunction, and pain.  ACTIVITY LIMITATIONS: bending, lifting, carry, locomotion, cleaning, community activity, driving, and or occupation  PERSONAL FACTORS: Cervical, knees and hands OA, osteoporosis, previous breast cancer, L1 and thoracic compression fractures are also affecting patient's  functional outcome.  REHAB POTENTIAL: Good  CLINICAL DECISION MAKING: Stable/uncomplicated  EVALUATION COMPLEXITY: Low    GOALS: SHORT TERM GOALS: Target date: 12/19/2022   Matison will have improved postural awareness and will be able to implement this into all ADLs. Baseline:  Goal status: NEW   2.  Kristlyn will be independent with her day 1 HEP. Baseline:  Goal status: NEW   LONG TERM GOALS: Target date: 01/30/2023  Improve low back pain to 0-3/10 with no radicular pain. Baseline: Can be 6/10 with sleeping and prolonged sitting Goal status: NEW   2.  Improve FOTO to 64. Baseline:  Goal status: NEW   3.  Improve lumbar ROM to [facility]  Baseline:   Goal status: NEW   4.  Ponda will be independent with her long-term HEP at DC. Baseline:  Goal status: NEW  PLAN: PT FREQUENCY: 1-3 times per week   PT DURATION: 6-8 weeks  PLANNED INTERVENTIONS (unless contraindicated): aquatic PT, Canalith repositioning, cryotherapy, Electrical stimulation, Iontophoresis with 4 mg/ml dexamethasome, Moist heat, traction, Ultrasound, gait training, Therapeutic exercise, balance training, neuromuscular re-education, patient/family education, prosthetic training, manual techniques, passive ROM, dry needling, taping, vasopnuematic device, vestibular, spinal manipulations, joint manipulations  PLAN FOR NEXT SESSION: Review/ update HEP. TPDN, IFC, mobility to lower back, core strength.    Lynden Ang, PT 12/05/2022, 10:20 AM

## 2022-12-05 ENCOUNTER — Encounter: Payer: Self-pay | Admitting: Physical Therapy

## 2022-12-05 ENCOUNTER — Ambulatory Visit (INDEPENDENT_AMBULATORY_CARE_PROVIDER_SITE_OTHER): Payer: Medicare Other | Admitting: Physical Therapy

## 2022-12-05 ENCOUNTER — Other Ambulatory Visit: Payer: Self-pay

## 2022-12-05 DIAGNOSIS — M79661 Pain in right lower leg: Secondary | ICD-10-CM

## 2022-12-05 DIAGNOSIS — R293 Abnormal posture: Secondary | ICD-10-CM | POA: Diagnosis not present

## 2022-12-05 DIAGNOSIS — M5416 Radiculopathy, lumbar region: Secondary | ICD-10-CM | POA: Diagnosis not present

## 2022-12-05 DIAGNOSIS — M6281 Muscle weakness (generalized): Secondary | ICD-10-CM

## 2022-12-05 DIAGNOSIS — M5459 Other low back pain: Secondary | ICD-10-CM

## 2022-12-22 ENCOUNTER — Ambulatory Visit: Payer: Medicare Other | Admitting: Podiatry

## 2022-12-23 ENCOUNTER — Ambulatory Visit (INDEPENDENT_AMBULATORY_CARE_PROVIDER_SITE_OTHER): Payer: Medicare Other | Admitting: Physical Therapy

## 2022-12-23 ENCOUNTER — Encounter: Payer: Self-pay | Admitting: Physical Therapy

## 2022-12-23 DIAGNOSIS — R293 Abnormal posture: Secondary | ICD-10-CM | POA: Diagnosis not present

## 2022-12-23 DIAGNOSIS — M6281 Muscle weakness (generalized): Secondary | ICD-10-CM

## 2022-12-23 DIAGNOSIS — M5459 Other low back pain: Secondary | ICD-10-CM

## 2022-12-23 DIAGNOSIS — M5416 Radiculopathy, lumbar region: Secondary | ICD-10-CM | POA: Diagnosis not present

## 2022-12-23 NOTE — Therapy (Signed)
OUTPATIENT PHYSICAL THERAPY THORACOLUMBAR TREATMENT   Patient Name: Cindy Schwartz MRN: [id number] DOB:[birth year], 89 y.o., female Today's Date: 12/23/2022  END OF SESSION:  PT End of Session - 12/23/22 1030     Visit Number 2    Number of Visits 12    Date for PT Re-Evaluation 01/23/23    Authorization Type [employer] MEDICARE    Progress Note Due on Visit 10    PT Start Time 1017    PT Stop Time 1056    PT Time Calculation (min) 39 min    Activity Tolerance Patient tolerated treatment well    Behavior During Therapy WFL for tasks assessed/performed              Past Medical History:  Diagnosis Date   Aneurysm of ascending aorta    a. 4.2-4.3cm in 2014.   Anxiety    Arthritis    Breast cancer 2018   right mastectomy   Cancer 1980   ovarian cancer   Compression fracture of L1 lumbar vertebra    COMPRESSION FRACTURE, THORACIC VERTEBRA 07/24/2010   Qualifier: Diagnosis of  By: [name], [name]    Elevated BP Transient 09/2014   Genetic testing 07/09/2017   Multi-Cancer panel (83 genes) @ [facility] - No pathogenic mutations detected   GERD (gastroesophageal reflux disease)    H/O bone density study    2015   H/O colonoscopy    History of ovarian cancer    November 1979   History of ovarian cancer 1979   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   Insomnia    Irregular heart beat    Normal coronary arteries Cath - 09/2014   Osteoporosis    Raynaud's disease    Past Surgical History:  Procedure Laterality Date   ABDOMINAL HYSTERECTOMY  1979   APPENDECTOMY     BILATERAL SALPINGOOPHORECTOMY     BREAST LUMPECTOMY     COLONOSCOPY  02/2012   LEFT HEART CATHETERIZATION WITH CORONARY ANGIOGRAM N/A 10/17/2014   Procedure: LEFT HEART CATHETERIZATION WITH CORONARY ANGIOGRAM;  Surgeon: [name], MD;  Location: [facility] CATH LAB;  Service: Cardiovascular;  Laterality: N/A;   ovarian ca s/p taj/bso  1979   SIMPLE MASTECTOMY WITH AXILLARY SENTINEL  NODE BIOPSY Right 05/05/2017   Procedure: RIGHT TOTAL MASTECTOMY WITH RIGHT  AXILLARY SENTINEL NODE BIOPSY;  Surgeon: [name], MD;  Location: [place];  Service: General;  Laterality: Right;  PECTORAL BLOCK   Patient Active Problem List   Diagnosis Date Noted   Bloating 07/31/2022   Left sided abdominal pain 07/31/2022   Belching 07/31/2022   COPD (chronic obstructive pulmonary disease) 03/17/2022   Osteoarthritis of carpometacarpal (CMC) joints of both thumbs 09/26/2021   Pain due to onychomycosis of toenails of both feet 12/25/2020   History of cerebellar stroke 10/05/2020   Memory loss 10/05/2020   Osteoarthritis of both hands 08/29/2020   Bilateral carpal tunnel syndrome 08/29/2020   Trigger thumb of left hand 08/29/2020   History of adenomatous polyp of colon 12/30/2019   Mild hyperlipidemia 06/01/2019   Degenerative arthritis of knee, bilateral 04/08/2019   Chronic idiopathic constipation 03/01/2019   Chronic cough 01/21/2018   Genetic testing 07/09/2017   history of Breast cancer of lower-outer quadrant of right female breast  01/16/2016   Palpitations 11/13/2014   Normal coronary arteries 10/17/2014   Varicose veins of lower extremities with other complications [id number]   Hx of herpes simplex infection 12/18/2011   Low back pain 06/28/2010  Lower abdominal pain 06/28/2010   Generalized anxiety disorder 10/13/2008   Irritable bowel syndrome 10/13/2008   Lung nodule 11/25/2007   Raynaud's syndrome 07/01/2007   Aneurysm of thoracic aorta 05/06/2007   Allergic rhinitis 05/06/2007   Esophageal reflux 05/06/2007   DEGENERATIVE DISC DISEASE, CERVICAL SPINE 05/06/2007   Osteoporosis 03/18/2007   History of ovarian cancer 09/22/1977    PCP: Marin Olp, MD  REFERRING PROVIDER: Callie Fielding, MD  REFERRING DIAG: Radiculopathy, lumbar region [M54.16]   Rationale for Evaluation and Treatment: Rehabilitation  THERAPY DIAG:  Muscle weakness  (generalized)  Other low back pain  Abnormal posture  Radiculopathy, lumbar region  ONSET DATE: Ongoing for over a year.   SUBJECTIVE:                                                                                                                                                                                           SUBJECTIVE STATEMENT:  My back is bothering me all the time, it's still going down my legs. My doctor said I need more core. Pain started in R LE, now sometimes goes down BLEs but this has been happening from time to time   PERTINENT HISTORY:  Cervical, knees and hands OA, osteoporosis, previous breast cancer, L1 and thoracic compression fractures   PAIN:  Are you having pain? Yes: NPRS scale: 5-6/10 Pain location: BLEs now down to distal gastroc  Pain description: sharp but not that sharp Aggravating factors: worst in the mornings  Relieving factors: lumbar extension helps some  PRECAUTIONS: Back  WEIGHT BEARING RESTRICTIONS: No  FALLS:  Has patient fallen in last 6 months? No  LIVING ENVIRONMENT: Lives with: lives alone Lives in: House/apartment Stairs:  No problem with stairs Has following equipment at home: None  OCCUPATION: Retired  PLOF: Independent  PATIENT GOALS: Be able to get up and moving in the morning quickly without pain.   OBJECTIVE:   DIAGNOSTIC FINDINGS:  No recent fracture is seen. Decrease in height of bodies of T12 and L1 vertebrae has not changed. Degenerative are noted in the lumbar spine, more so at L2-L3 and L5-S1 levels with interval progression.  PATIENT SURVEYS:  FOTO 56.31%, 64% in 11 visits.   SCREENING FOR RED FLAGS: Bowel or bladder incontinence: No Spinal tumors: No Cauda equina syndrome: No Compression fracture: Yes: Previous thoracic and lumbar Abdominal aneurysm: Yes: Previous  COGNITION: Overall cognitive status: Within functional limits for tasks assessed     SENSATION: Not tested  POSTURE: Mildly  rounded shoulders, forward head and decreased lumbar lordosis   PALPATION: No tenderness, but tightness noted in R L3-L5 lumbar paraspinals  and piriformis.   LUMBAR ROM:   AROM eval  Flexion 50%   Extension 50%   Right lateral flexion 100%   Left lateral flexion 100%   Right rotation 100%   Left rotation 100%    (Blank rows = not tested)  LOWER EXTREMITY ROM:     Active  Right eval Left eval  Hip flexion [facility]  Hip internal rotation [facility]  Hip external rotation [facility]  Knee flexion [facility] WFL  Knee extension WFL WFL   (Blank rows = not tested)  LOWER EXTREMITY MMT:    MMT Right eval Left eval  Hip flexion 4- 4  Knee flexion 4- 4  Knee extension 4- 4   (Blank rows = not tested)  LUMBAR SPECIAL TESTS:  Straight leg raise test: Positive   GAIT: Distance walked: walked independently in the clinic 10ft Assistive device utilized: None Level of assistance: Complete Independence Comments: No abnormalities noted.   TODAY'S TREATMENT:                                                                                                                              DATE:    12/23/22  Durenda Guthrie with 2 second hold x12 Attempted PPT x15, Mod cues but still difficult- needs more practice Supine TA sets x15 with 3 second holds  Bridges + ABD into red TB x12  Sidelying clams red TB x12 B  TA set + march x12 B Prone press up on elbows x20- pain centralized from LEs to L4 level Nustep L4 x6 minutes BLEs only     Eval  Creating, reviewing, and completing below HEP   Trigger Point Dry-Needling  Treatment instructions: Expect mild to moderate muscle soreness. S/S of pneumothorax if dry needled over a lung field, and to seek immediate medical attention should they occur. Patient verbalized understanding of these instructions and education.  Patient Consent Given: Yes Education handout provided: No Muscles treated: R L3-L5 lumbar paraspinals and piriformis.   Electrical stimulation performed: Yes Parameters:  Mod intensity, low amplitude.  Treatment response/outcome: Decreased pain.     PATIENT EDUCATION:  Education details: Educated pt on anatomy and physiology of current symptoms, FOTO, diagnosis, prognosis, HEP,  and POC. Person educated: Patient Education method: Customer service manager Education comprehension: verbalized understanding and returned demonstration  HOME EXERCISE PROGRAM: Access Code: [email or web address] URL: [email or web address] Date: 12/05/2022 Prepared by: Rudi Heap   Exercises - Standing Lumbar Extension at [place]  - 5 x daily - 7 x weekly - 1 sets - 5 reps - 3 seconds hold - Supine Lower Trunk Rotation  - 1-2 x daily - 1-7 x weekly - 1 sets - 5 reps - 10 seconds hold - Supine Figure 4 Piriformis Stretch  - 1-2 x daily - 7 x weekly - 1 sets - 4-5 reps - 20 seconds hold - Seated Table Hamstring Stretch  - 1-2 x daily - 7 x  weekly - 1 sets - 4-5 reps - 20 seconds hold - Modified Thomas Stretch  - 1-2 x daily - 7 x weekly - 1 sets - 4-5 reps - 20 seconds hold - Standing Scapular Retraction  - 5 x daily - 7 x weekly - 1 sets - 5 reps - 5 second hold   ASSESSMENT:  CLINICAL IMPRESSION:   Ms. Jetty arrives today doing OK, still having back pain but now it is in BLEs, she tells me this does happen from time to time. Sounds like extension may help a bit at baseline, tried to focus on core strengthening/extension based program today. If she feels this was helpful we can certainly add these activities to HEP next session. Prone on elbows seemed to significantly improve and centralize pain, encouraged this at home PRN/as tolerated given hx of shoulder pain.   OBJECTIVE IMPAIRMENTS: decreased activity tolerance, difficulty walking, decreased balance, decreased endurance, decreased mobility, decreased ROM, decreased strength, impaired flexibility, impaired UE/LE use, postural dysfunction, and  pain.  ACTIVITY LIMITATIONS: bending, lifting, carry, locomotion, cleaning, community activity, driving, and or occupation  PERSONAL FACTORS: Cervical, knees and hands OA, osteoporosis, previous breast cancer, L1 and thoracic compression fractures are also affecting patient's functional outcome.  REHAB POTENTIAL: Good  CLINICAL DECISION MAKING: Stable/uncomplicated  EVALUATION COMPLEXITY: Low    GOALS: SHORT TERM GOALS: Target date: 12/19/2022   Marvell will have improved postural awareness and will be able to implement this into all ADLs. Baseline:  Goal status: NEW   2.  Lashiya will be independent with her day 1 HEP. Baseline:  Goal status: NEW   LONG TERM GOALS: Target date: 01/30/2023  Improve low back pain to 0-3/10 with no radicular pain. Baseline: Can be 6/10 with sleeping and prolonged sitting Goal status: NEW   2.  Improve FOTO to 64. Baseline:  Goal status: NEW   3.  Improve lumbar ROM to [facility]  Baseline:   Goal status: NEW   4.  Lenor will be independent with her long-term HEP at [place]. Baseline:  Goal status: NEW  PLAN: PT FREQUENCY: 1-3 times per week   PT DURATION: 6-8 weeks  PLANNED INTERVENTIONS (unless contraindicated): aquatic PT, Canalith repositioning, cryotherapy, Electrical stimulation, Iontophoresis with 4 mg/ml dexamethasome, Moist heat, traction, Ultrasound, gait training, Therapeutic exercise, balance training, neuromuscular re-education, patient/family education, prosthetic training, manual techniques, passive ROM, dry needling, taping, vasopnuematic device, vestibular, spinal manipulations, joint manipulations  PLAN FOR NEXT SESSION: how did she feel after increased focus on extension exercises/core? Update/add to HEP PRN. Core and hip strength. Needs more practice on PPT.   Deniece Ree PT DPT PN2

## 2022-12-25 ENCOUNTER — Ambulatory Visit (INDEPENDENT_AMBULATORY_CARE_PROVIDER_SITE_OTHER): Payer: Medicare Other | Admitting: Physical Therapy

## 2022-12-25 ENCOUNTER — Encounter: Payer: Self-pay | Admitting: Physical Therapy

## 2022-12-25 DIAGNOSIS — R293 Abnormal posture: Secondary | ICD-10-CM | POA: Diagnosis not present

## 2022-12-25 DIAGNOSIS — M5416 Radiculopathy, lumbar region: Secondary | ICD-10-CM | POA: Diagnosis not present

## 2022-12-25 DIAGNOSIS — M5459 Other low back pain: Secondary | ICD-10-CM

## 2022-12-25 DIAGNOSIS — M79661 Pain in right lower leg: Secondary | ICD-10-CM

## 2022-12-25 DIAGNOSIS — M6281 Muscle weakness (generalized): Secondary | ICD-10-CM | POA: Diagnosis not present

## 2022-12-25 NOTE — Therapy (Signed)
OUTPATIENT PHYSICAL THERAPY THORACOLUMBAR TREATMENT   Patient Name: Cindy Schwartz MRN: [id number] DOB:1934-07-27, 89 y.o., female Today's Date: 12/25/2022  END OF SESSION:  PT End of Session - 12/25/22 1030     Visit Number 3    Number of Visits 12    Date for PT Re-Evaluation 01/23/23    Authorization Type [employer]IC[employer]    Progress Note Due on Visit 10    PT Start Time 1021   pt a few minutes late   PT Stop Time 1057    PT Time Calculation (min) 36 min    Activity Tolerance Patient tolerated treatment well    Behavior During Therapy WFL for tasks assessed/performed               Past Medical History:  Diagnosis Date   Aneurysm of ascending aorta    a. 4.2-4.3cm in 2014.   Anxiety    Arthritis    Breast cancer 2018   right mastectomy   Cancer 1980   ovarian cancer   Compression fracture of L1 lumbar vertebra    COMPRESSION FRACTURE, THORACIC VERTEBRA 07/24/2010   Qualifier: Diagnosis of  By: [name], [name]    Elevated BP Transient 09/2014   Genetic testing 07/09/2017   Multi-Cancer panel (83 genes) @ [facility] - No pathogenic mutations detected   GERD (gastroesophageal reflux disease)    H/O bone density study    2015   H/O colonoscopy    History of ovarian cancer    November 1979   History of ovarian cancer 1979   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   Insomnia    Irregular heart beat    Normal coronary arteries Cath - 09/2014   Osteoporosis    Raynaud's disease    Past Surgical History:  Procedure Laterality Date   ABDOMINAL HYSTERECTOMY  1979   APPENDECTOMY     BILATERAL SALPINGOOPHORECTOMY     BREAST LUMPECTOMY     COLONOSCOPY  02/2012   LEFT HEART CATHETERIZATION WITH CORONARY ANGIOGRAM N/A 10/17/2014   Procedure: LEFT HEART CATHETERIZATION WITH CORONARY ANGIOGRAM;  Surgeon: [name], MD;  Location: [facility] CATH LAB;  Service: Cardiovascular;  Laterality: N/A;   ovarian ca s/p taj/bso  1979   SIMPLE MASTECTOMY  WITH AXILLARY SENTINEL NODE BIOPSY Right 05/05/2017   Procedure: RIGHT TOTAL MASTECTOMY WITH RIGHT  AXILLARY SENTINEL NODE BIOPSY;  Surgeon: [name], MD;  Location: [place];  Service: General;  Laterality: Right;  PECTORAL BLOCK   Patient Active Problem List   Diagnosis Date Noted   Bloating 07/31/2022   Left sided abdominal pain 07/31/2022   Belching 07/31/2022   COPD (chronic obstructive pulmonary disease) 03/17/2022   Osteoarthritis of carpometacarpal (CMC) joints of both thumbs 09/26/2021   Pain due to onychomycosis of toenails of both feet 12/25/2020   History of cerebellar stroke 10/05/2020   Memory loss 10/05/2020   Osteoarthritis of both hands 08/29/2020   Bilateral carpal tunnel syndrome 08/29/2020   Trigger thumb of left hand 08/29/2020   History of adenomatous polyp of colon 12/30/2019   Mild hyperlipidemia 06/01/2019   Degenerative arthritis of knee, bilateral 04/08/2019   Chronic idiopathic constipation 03/01/2019   Chronic cough 01/21/2018   Genetic testing 07/09/2017   history of Breast cancer of lower-outer quadrant of right female breast  01/16/2016   Palpitations 11/13/2014   Normal coronary arteries 10/17/2014   Varicose veins of lower extremities with other complications [id number]   Hx of herpes simplex infection  12/18/2011   Low back pain 06/28/2010   Lower abdominal pain 06/28/2010   Generalized anxiety disorder 10/13/2008   Irritable bowel syndrome 10/13/2008   Lung nodule 11/25/2007   Raynaud's syndrome 07/01/2007   Aneurysm of thoracic aorta 05/06/2007   Allergic rhinitis 05/06/2007   Esophageal reflux 05/06/2007   DEGENERATIVE [place] DISEASE, CERVICAL SPINE 05/06/2007   Osteoporosis 03/18/2007   History of ovarian cancer 09/22/1977    PCP: Marin Olp, MD  REFERRING PROVIDER: Callie Fielding, MD  REFERRING DIAG: Radiculopathy, lumbar region [M54.16]   Rationale for Evaluation and Treatment: Rehabilitation  THERAPY DIAG:  Muscle  weakness (generalized)  Other low back pain  Abnormal posture  Radiculopathy, lumbar region  Pain in right lower leg  ONSET DATE: Ongoing for over a year.   SUBJECTIVE:                                                                                                                                                                                           SUBJECTIVE STATEMENT:  Things are feeling OK since last time. Felt some discomfort in left LE and a little discomfort in the back. Its a little better than its been. Back brace helps me a little, I need a different support that's less bulky.   PERTINENT HISTORY:  Cervical, knees and hands OA, osteoporosis, previous breast cancer, L1 and thoracic compression fractures   PAIN:  Are you having pain? Yes: NPRS scale: 4/10 Pain location: BLEs but only down to upper calf   Pain description: quick but sharp pain  Aggravating factors: worst in the mornings  Relieving factors: lumbar extension helps some  PRECAUTIONS: Back  WEIGHT BEARING RESTRICTIONS: No  FALLS:  Has patient fallen in last 6 months? No  LIVING ENVIRONMENT: Lives with: lives alone Lives in: House/apartment Stairs:  No problem with stairs Has following equipment at home: None  OCCUPATION: Retired  PLOF: Independent  PATIENT GOALS: Be able to get up and moving in the morning quickly without pain.   OBJECTIVE:   DIAGNOSTIC FINDINGS:  No recent fracture is seen. Decrease in height of bodies of T12 and L1 vertebrae has not changed. Degenerative are noted in the lumbar spine, more so at L2-L3 and L5-S1 levels with interval progression.  PATIENT SURVEYS:  FOTO 56.31%, 64% in 11 visits.   SCREENING FOR RED FLAGS: Bowel or bladder incontinence: No Spinal tumors: No Cauda equina syndrome: No Compression fracture: Yes: Previous thoracic and lumbar Abdominal aneurysm: Yes: Previous  COGNITION: Overall cognitive status: Within functional limits for tasks  assessed     SENSATION: Not tested  POSTURE: Mildly rounded shoulders, forward  head and decreased lumbar lordosis   PALPATION: No tenderness, but tightness noted in R L3-L5 lumbar paraspinals and piriformis.   LUMBAR ROM:   AROM eval  Flexion 50%   Extension 50%   Right lateral flexion 100%   Left lateral flexion 100%   Right rotation 100%   Left rotation 100%    (Blank rows = not tested)  LOWER EXTREMITY ROM:     Active  Right eval Left eval  Hip flexion [facility]  Hip internal rotation [facility]  Hip external rotation [facility]  Knee flexion [facility] WFL  Knee extension WFL WFL   (Blank rows = not tested)  LOWER EXTREMITY MMT:    MMT Right eval Left eval  Hip flexion 4- 4  Knee flexion 4- 4  Knee extension 4- 4   (Blank rows = not tested)  LUMBAR SPECIAL TESTS:  Straight leg raise test: Positive   GAIT: Distance walked: walked independently in the clinic 53ft Assistive device utilized: None Level of assistance: Complete Independence Comments: No abnormalities noted.   TODAY'S TREATMENT:                                                                                                                              DATE:   12/25/22  TherEx  Bridges + ABD into red TB x20 Hip ABD with red TB x10 B Walking bridges x5 TA sets + 3-5 second holds x15  Curl ups x10 Sitting on BOSU on mat table feet unsupported: TA sets x10 with 3 second holds, seated marches + TA set x20 alternating, alternating LAQs + OH reach 0# x10 B Lateral trunk crunches x10 B Standing lumbar extensions at wall x10  Nustep L4x6 minutes BLEs only    12/23/22  TherEx  Bridges with 2 second hold x12 Attempted PPT x15, Mod cues but still difficult- needs more practice Supine TA sets x15 with 3 second holds  Bridges + ABD into red TB x12  Sidelying clams red TB x12 B  TA set + march x12 B Prone press up on elbows x20- pain centralized from LEs to L4 level Nustep L4 x6 minutes BLEs only      Eval  Creating, reviewing, and completing below HEP   Trigger Point Dry-Needling  Treatment instructions: Expect mild to moderate muscle soreness. S/S of pneumothorax if dry needled over a lung field, and to seek immediate medical attention should they occur. Patient verbalized understanding of these instructions and education.  Patient Consent Given: Yes Education handout provided: No Muscles treated: R L3-L5 lumbar paraspinals and piriformis.  Electrical stimulation performed: Yes Parameters:  Mod intensity, low amplitude.  Treatment response/outcome: Decreased pain.     PATIENT EDUCATION:  Education details: Educated pt on anatomy and physiology of current symptoms, FOTO, diagnosis, prognosis, HEP,  and POC. Person educated: Patient Education method: Customer service manager Education comprehension: verbalized understanding and returned demonstration  HOME EXERCISE PROGRAM: Access Code: [email or web address] URL:  [email or web address] Date: 12/05/2022 Prepared by: Rudi Heap   Exercises - Standing Lumbar Extension at [place]  - 5 x daily - 7 x weekly - 1 sets - 5 reps - 3 seconds hold - Supine Lower Trunk Rotation  - 1-2 x daily - 1-7 x weekly - 1 sets - 5 reps - 10 seconds hold - Supine Figure 4 Piriformis Stretch  - 1-2 x daily - 7 x weekly - 1 sets - 4-5 reps - 20 seconds hold - Seated Table Hamstring Stretch  - 1-2 x daily - 7 x weekly - 1 sets - 4-5 reps - 20 seconds hold - Modified [place] Stretch  - 1-2 x daily - 7 x weekly - 1 sets - 4-5 reps - 20 seconds hold - Standing Scapular Retraction  - 5 x daily - 7 x weekly - 1 sets - 5 reps - 5 second hold   ASSESSMENT:  CLINICAL IMPRESSION:   Ms. Roeder arrives today doing a little better today- I think she will really benefit from progression of core strength and extension based exercises. Pain has centralized a bit since last visit, now only going down to upper calves. Core still very weak. Will  progress all activities as tolerated moving forward.   OBJECTIVE IMPAIRMENTS: decreased activity tolerance, difficulty walking, decreased balance, decreased endurance, decreased mobility, decreased ROM, decreased strength, impaired flexibility, impaired UE/LE use, postural dysfunction, and pain.  ACTIVITY LIMITATIONS: bending, lifting, carry, locomotion, cleaning, community activity, driving, and or occupation  PERSONAL FACTORS: Cervical, knees and hands OA, osteoporosis, previous breast cancer, L1 and thoracic compression fractures are also affecting patient's functional outcome.  REHAB POTENTIAL: Good  CLINICAL DECISION MAKING: Stable/uncomplicated  EVALUATION COMPLEXITY: Low    GOALS: SHORT TERM GOALS: Target date: 12/19/2022   Zeffie will have improved postural awareness and will be able to implement this into all ADLs. Baseline:  Goal status: NEW   2.  Ong will be independent with her day 1 HEP. Baseline:  Goal status: NEW   LONG TERM GOALS: Target date: 01/30/2023  Improve low back pain to 0-3/10 with no radicular pain. Baseline: Can be 6/10 with sleeping and prolonged sitting Goal status: NEW   2.  Improve FOTO to 64. Baseline:  Goal status: NEW   3.  Improve lumbar ROM to [facility]  Baseline:   Goal status: NEW   4.  [place] will be independent with her long-term HEP at [place]. Baseline:  Goal status: NEW  PLAN: PT FREQUENCY: 1-3 times per week   PT DURATION: 6-8 weeks  PLANNED INTERVENTIONS (unless contraindicated): aquatic PT, Canalith repositioning, cryotherapy, Electrical stimulation, Iontophoresis with 4 mg/ml dexamethasome, Moist heat, traction, Ultrasound, gait training, Therapeutic exercise, balance training, neuromuscular re-education, patient/family education, prosthetic training, manual techniques, passive ROM, dry needling, taping, vasopnuematic device, vestibular, spinal manipulations, joint manipulations  PLAN FOR NEXT SESSION:  focus on core  strength/extension programming, HEP updates PRN   Deniece Ree PT DPT PN2

## 2022-12-30 ENCOUNTER — Encounter: Payer: Medicare Other | Admitting: Physical Therapy

## 2022-12-31 ENCOUNTER — Encounter: Payer: Self-pay | Admitting: Physical Therapy

## 2022-12-31 ENCOUNTER — Other Ambulatory Visit: Payer: Self-pay | Admitting: General Surgery

## 2022-12-31 ENCOUNTER — Ambulatory Visit (INDEPENDENT_AMBULATORY_CARE_PROVIDER_SITE_OTHER): Payer: Medicare Other | Admitting: Physical Therapy

## 2022-12-31 DIAGNOSIS — R293 Abnormal posture: Secondary | ICD-10-CM | POA: Diagnosis not present

## 2022-12-31 DIAGNOSIS — N644 Mastodynia: Secondary | ICD-10-CM

## 2022-12-31 DIAGNOSIS — M5416 Radiculopathy, lumbar region: Secondary | ICD-10-CM

## 2022-12-31 DIAGNOSIS — M5459 Other low back pain: Secondary | ICD-10-CM | POA: Diagnosis not present

## 2022-12-31 DIAGNOSIS — M6281 Muscle weakness (generalized): Secondary | ICD-10-CM

## 2022-12-31 NOTE — Therapy (Signed)
OUTPATIENT PHYSICAL THERAPY THORACOLUMBAR TREATMENT   Patient Name: Natha Guin MRN: [id number] DOB:08/07/1934, 89 y.o., female Today's Date: 12/31/2022  END OF SESSION:  PT End of Session - 12/31/22 0956     Visit Number 4    Number of Visits 12    Date for PT Re-Evaluation 01/23/23    Authorization Type [employer]ICARE    Progress Note Due on Visit 10    PT Start Time [phone]   pt very late   PT Stop Time 1028    PT Time Calculation (min) 41 min    Activity Tolerance Patient tolerated treatment well    Behavior During Therapy WFL for tasks assessed/performed                Past Medical History:  Diagnosis Date   Aneurysm of ascending aorta    a. 4.2-4.3cm in 2014.   Anxiety    Arthritis    Breast cancer 2018   right mastectomy   Cancer 1980   ovarian cancer   Compression fracture of L1 lumbar vertebra    COMPRESSION FRACTURE, THORACIC VERTEBRA 07/24/2010   Qualifier: Diagnosis of  By: Lovell Sheehan MD, [name]    Elevated BP Transient 09/2014   Genetic testing 07/09/2017   Multi-Cancer panel (83 genes) @ [facility] - No pathogenic mutations detected   GERD (gastroesophageal reflux disease)    H/O bone density study    2015   H/O colonoscopy    History of ovarian cancer    November 1979   History of ovarian cancer 1979   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   Insomnia    Irregular heart beat    Normal coronary arteries Cath - 09/2014   Osteoporosis    Raynaud's disease    Past Surgical History:  Procedure Laterality Date   ABDOMINAL HYSTERECTOMY  1979   APPENDECTOMY     BILATERAL SALPINGOOPHORECTOMY     BREAST LUMPECTOMY     COLONOSCOPY  02/2012   LEFT HEART CATHETERIZATION WITH CORONARY ANGIOGRAM N/A 10/17/2014   Procedure: LEFT HEART CATHETERIZATION WITH CORONARY ANGIOGRAM;  Surgeon: Lennette Bihari, MD;  Location: [facility] CATH LAB;  Service: Cardiovascular;  Laterality: N/A;   ovarian ca s/p taj/bso  1979   SIMPLE MASTECTOMY WITH  AXILLARY SENTINEL NODE BIOPSY Right 05/05/2017   Procedure: RIGHT TOTAL MASTECTOMY WITH RIGHT  AXILLARY SENTINEL NODE BIOPSY;  Surgeon: Emelia Loron, MD;  Location: [facility] OR;  Service: General;  Laterality: Right;  PECTORAL BLOCK   Patient Active Problem List   Diagnosis Date Noted   Bloating 07/31/2022   Left sided abdominal pain 07/31/2022   Belching 07/31/2022   COPD (chronic obstructive pulmonary disease) 03/17/2022   Osteoarthritis of carpometacarpal (CMC) joints of both thumbs 09/26/2021   Pain due to onychomycosis of toenails of both feet 12/25/2020   History of cerebellar stroke 10/05/2020   Memory loss 10/05/2020   Osteoarthritis of both hands 08/29/2020   Bilateral carpal tunnel syndrome 08/29/2020   Trigger thumb of left hand 08/29/2020   History of adenomatous polyp of colon 12/30/2019   Mild hyperlipidemia 06/01/2019   Degenerative arthritis of knee, bilateral 04/08/2019   Chronic idiopathic constipation 03/01/2019   Chronic cough 01/21/2018   Genetic testing 07/09/2017   history of Breast cancer of lower-outer quadrant of right female breast  01/16/2016   Palpitations 11/13/2014   Normal coronary arteries 10/17/2014   Varicose veins of lower extremities with other complications 03/09/2012   Hx of herpes simplex infection 12/18/2011  Low back pain 06/28/2010   Lower abdominal pain 06/28/2010   Generalized anxiety disorder 10/13/2008   Irritable bowel syndrome 10/13/2008   Lung nodule 11/25/2007   Raynaud's syndrome 07/01/2007   Aneurysm of thoracic aorta 05/06/2007   Allergic rhinitis 05/06/2007   Esophageal reflux 05/06/2007   DEGENERATIVE DISC DISEASE, CERVICAL SPINE 05/06/2007   Osteoporosis 03/18/2007   History of ovarian cancer 09/22/1977    PCP: Shelva Majestic, MD  REFERRING PROVIDER: London Sheer, MD  REFERRING DIAG: Radiculopathy, lumbar region [M54.16]   Rationale for Evaluation and Treatment: Rehabilitation  THERAPY DIAG:  Muscle  weakness (generalized)  Other low back pain  Abnormal posture  Radiculopathy, lumbar region  ONSET DATE: Ongoing for over a year.   SUBJECTIVE:                                                                                                                                                                                           SUBJECTIVE STATEMENT:  I know I'm really late, the surgery center called me last minute and I had to wait, I need more stretches I could barely move this morning. Pain is going more down my left leg than down the right. I had some more pain after last session, not sure if this was from what we did or from a longer walk I did that same day. This morning was different I had to make my muscles more soft or something.   PERTINENT HISTORY:  Cervical, knees and hands OA, osteoporosis, previous breast cancer, L1 and thoracic compression fractures   PAIN:  Are you having pain? Yes: NPRS scale: 7/10 Pain location: L LE down to mid calf  Pain description: sharp Aggravating factors: worst in the morning Relieving factors: stretching   PRECAUTIONS: Back  WEIGHT BEARING RESTRICTIONS: No  FALLS:  Has patient fallen in last 6 months? No  LIVING ENVIRONMENT: Lives with: lives alone Lives in: House/apartment Stairs:  No problem with stairs Has following equipment at home: None  OCCUPATION: Retired  PLOF: Independent  PATIENT GOALS: Be able to get up and moving in the morning quickly without pain.   OBJECTIVE:   DIAGNOSTIC FINDINGS:  No recent fracture is seen. Decrease in height of bodies of T12 and L1 vertebrae has not changed. Degenerative are noted in the lumbar spine, more so at L2-L3 and L5-S1 levels with interval progression.  PATIENT SURVEYS:  FOTO 56.31%, 64% in 11 visits.   SCREENING FOR RED FLAGS: Bowel or bladder incontinence: No Spinal tumors: No Cauda equina syndrome: No Compression fracture: Yes: Previous thoracic and lumbar Abdominal  aneurysm: Yes: Previous  COGNITION: Overall  cognitive status: Within functional limits for tasks assessed     SENSATION: Not tested  POSTURE: Mildly rounded shoulders, forward head and decreased lumbar lordosis   PALPATION: No tenderness, but tightness noted in R L3-L5 lumbar paraspinals and piriformis.   LUMBAR ROM:   AROM eval  Flexion 50%   Extension 50%   Right lateral flexion 100%   Left lateral flexion 100%   Right rotation 100%   Left rotation 100%    (Blank rows = not tested)  LOWER EXTREMITY ROM:     Active  Right eval Left eval  Hip flexion [facility]  Hip internal rotation [facility]  Hip external rotation [facility]  Knee flexion [facility] WFL  Knee extension WFL WFL   (Blank rows = not tested)  LOWER EXTREMITY MMT:    MMT Right eval Left eval  Hip flexion 4- 4  Knee flexion 4- 4  Knee extension 4- 4   (Blank rows = not tested)  LUMBAR SPECIAL TESTS:  Straight leg raise test: Positive   GAIT: Distance walked: walked independently in the clinic 48ft Assistive device utilized: None Level of assistance: Complete Independence Comments: No abnormalities noted.   TODAY'S TREATMENT:                                                                                                                              DATE:   12/31/22  Sciatic tension test (-) B LEs No LLD noted  Clonus (-) BLEs   Lots of education on [facility] late policy, needed a lot of cues for exercise form/purpose today   TherEx  Nustep L4 x6 minutes for warmup Trialed SKTC, no stretching/tightness noted Lumbar rotation stretch 5x5 seconds B Supine figure 4 piriformis stretches 3x30 seconds B  Supine HS stretches 3x30 seconds B      12/25/22  TherEx  Bridges + ABD into red TB x20 Hip ABD with red TB x10 B Walking bridges x5 TA sets + 3-5 second holds x15  Curl ups x10 Sitting on BOSU on mat table feet unsupported: TA sets x10 with 3 second holds, seated marches + TA set x20  alternating, alternating LAQs + OH reach 0# x10 B Lateral trunk crunches x10 B Standing lumbar extensions at wall x10  Nustep L4x6 minutes BLEs only    12/23/22  TherEx  Bridges with 2 second hold x12 Attempted PPT x15, Mod cues but still difficult- needs more practice Supine TA sets x15 with 3 second holds  Bridges + ABD into red TB x12  Sidelying clams red TB x12 B  TA set + march x12 B Prone press up on elbows x20- pain centralized from LEs to L4 level Nustep L4 x6 minutes BLEs only     Eval  Creating, reviewing, and completing below HEP   Trigger Point Dry-Needling  Treatment instructions: Expect mild to moderate muscle soreness. S/S of pneumothorax if dry needled over a lung field, and to seek  immediate medical attention should they occur. Patient verbalized understanding of these instructions and education.  Patient Consent Given: Yes Education handout provided: No Muscles treated: R L3-L5 lumbar paraspinals and piriformis.  Electrical stimulation performed: Yes Parameters:  Mod intensity, low amplitude.  Treatment response/outcome: Decreased pain.     PATIENT EDUCATION:  Education details: Educated pt on anatomy and physiology of current symptoms, FOTO, diagnosis, prognosis, HEP,  and POC. Person educated: Patient Education method: Medical illustrator Education comprehension: verbalized understanding and returned demonstration  HOME EXERCISE PROGRAM: Access Code: [email or web address] URL: [email or web address] Date: 12/05/2022 Prepared by: Royal Hawthorn   Exercises - Standing Lumbar Extension at Wall - Forearms  - 5 x daily - 7 x weekly - 1 sets - 5 reps - 3 seconds hold - Supine Lower Trunk Rotation  - 1-2 x daily - 1-7 x weekly - 1 sets - 5 reps - 10 seconds hold - Supine Figure 4 Piriformis Stretch  - 1-2 x daily - 7 x weekly - 1 sets - 4-5 reps - 20 seconds hold - Seated Table Hamstring Stretch  - 1-2 x daily - 7 x weekly - 1 sets - 4-5 reps -  20 seconds hold - Modified Thomas Stretch  - 1-2 x daily - 7 x weekly - 1 sets - 4-5 reps - 20 seconds hold - Standing Scapular Retraction  - 5 x daily - 7 x weekly - 1 sets - 5 reps - 5 second hold   ASSESSMENT:  CLINICAL IMPRESSION:   Ms. Southard arrives today quite late, fortunately we were able to adjust clinician schedules and work her in for the full session. Seemed a bit anxious this morning and perseverated on stretching, focused on flexibility today with some significant impairments found in hams and quads. Checked for LLD and sciatic tension today as well given shift in pain to LLE. She was fairly unclear in describing symptoms today which made problem solving difficult.  Will continue efforts.  OBJECTIVE IMPAIRMENTS: decreased activity tolerance, difficulty walking, decreased balance, decreased endurance, decreased mobility, decreased ROM, decreased strength, impaired flexibility, impaired UE/LE use, postural dysfunction, and pain.  ACTIVITY LIMITATIONS: bending, lifting, carry, locomotion, cleaning, community activity, driving, and or occupation  PERSONAL FACTORS: Cervical, knees and hands OA, osteoporosis, previous breast cancer, L1 and thoracic compression fractures are also affecting patient's functional outcome.  REHAB POTENTIAL: Good  CLINICAL DECISION MAKING: Stable/uncomplicated  EVALUATION COMPLEXITY: Low    GOALS: SHORT TERM GOALS: Target date: 12/19/2022   Hava will have improved postural awareness and will be able to implement this into all ADLs. Baseline:  Goal status: NEW   2.  Sydny will be independent with her day 1 HEP. Baseline:  Goal status: NEW   LONG TERM GOALS: Target date: 01/30/2023  Improve low back pain to 0-3/10 with no radicular pain. Baseline: Can be 6/10 with sleeping and prolonged sitting Goal status: NEW   2.  Improve FOTO to 64. Baseline:  Goal status: NEW   3.  Improve lumbar ROM to [facility]  Baseline:   Goal status: NEW   4.   Jennfer will be independent with her long-term HEP at [place]. Baseline:  Goal status: NEW  PLAN: PT FREQUENCY: 1-3 times per week   PT DURATION: 6-8 weeks  PLANNED INTERVENTIONS (unless contraindicated): aquatic PT, Canalith repositioning, cryotherapy, Electrical stimulation, Iontophoresis with 4 mg/ml dexamethasome, Moist heat, traction, Ultrasound, gait training, Therapeutic exercise, balance training, neuromuscular re-education, patient/family education, prosthetic training, manual techniques, passive ROM, dry needling, taping, vasopnuematic  device, vestibular, spinal manipulations, joint manipulations  PLAN FOR NEXT SESSION:  focus on core strength/extension programming, HEP updates PRN. Further investigate LLE symptom and response to tx   Nedra HaiKristen Meribeth Vitug PT DPT PN2

## 2023-01-01 ENCOUNTER — Other Ambulatory Visit: Payer: Self-pay | Admitting: Surgery

## 2023-01-01 DIAGNOSIS — I7121 Aneurysm of the ascending aorta, without rupture: Secondary | ICD-10-CM

## 2023-01-02 ENCOUNTER — Encounter: Payer: Medicare Other | Admitting: Physical Therapy

## 2023-01-06 ENCOUNTER — Encounter: Payer: Medicare Other | Admitting: Physical Therapy

## 2023-01-07 ENCOUNTER — Encounter: Payer: Self-pay | Admitting: Physical Therapy

## 2023-01-07 ENCOUNTER — Telehealth: Payer: Self-pay | Admitting: Physical Therapy

## 2023-01-07 ENCOUNTER — Encounter: Payer: Medicare Other | Admitting: Physical Therapy

## 2023-01-07 NOTE — Telephone Encounter (Signed)
No-show for today's appointment. Called and spoke to patient directly, she apologizes for missing appointment and requests to be discharged.  Nedra Hai PT DPT PN2

## 2023-01-07 NOTE — Therapy (Signed)
PHYSICAL THERAPY DISCHARGE SUMMARY  Visits from Start of Care: 4  Current functional level related to goals / functional outcomes: Requested to be discharged from PT via phone    Remaining deficits: Unable to assess    Education / Equipment: Unable to assess    Patient agrees to discharge. Patient goals were not met. Patient is being discharged due to the patient's request.   Nedra Hai PT DPT PN2

## 2023-01-08 ENCOUNTER — Encounter: Payer: Self-pay | Admitting: Family Medicine

## 2023-01-08 ENCOUNTER — Ambulatory Visit (INDEPENDENT_AMBULATORY_CARE_PROVIDER_SITE_OTHER): Payer: Medicare Other | Admitting: Family Medicine

## 2023-01-08 VITALS — BP 130/84 | HR 62 | Temp 97.6°F | Ht 65.0 in | Wt 136.6 lb

## 2023-01-08 DIAGNOSIS — M48062 Spinal stenosis, lumbar region with neurogenic claudication: Secondary | ICD-10-CM | POA: Diagnosis not present

## 2023-01-08 DIAGNOSIS — J449 Chronic obstructive pulmonary disease, unspecified: Secondary | ICD-10-CM

## 2023-01-08 DIAGNOSIS — E785 Hyperlipidemia, unspecified: Secondary | ICD-10-CM | POA: Diagnosis not present

## 2023-01-08 DIAGNOSIS — I712 Thoracic aortic aneurysm, without rupture, unspecified: Secondary | ICD-10-CM

## 2023-01-08 DIAGNOSIS — M48061 Spinal stenosis, lumbar region without neurogenic claudication: Secondary | ICD-10-CM | POA: Insufficient documentation

## 2023-01-08 LAB — COMPREHENSIVE METABOLIC PANEL
ALT: 13 U/L (ref 0–35)
AST: 20 U/L (ref 0–37)
Albumin: 4.2 g/dL (ref 3.5–5.2)
Alkaline Phosphatase: 79 U/L (ref 39–117)
BUN: 15 mg/dL (ref 6–23)
CO2: 30 mEq/L (ref 19–32)
Calcium: 9.4 mg/dL (ref 8.4–10.5)
Chloride: 101 mEq/L (ref 96–112)
Creatinine, Ser: 0.6 mg/dL (ref 0.40–1.20)
GFR: 80.91 mL/min (ref >60.00)
Glucose, Bld: 84 mg/dL (ref 70–99)
Potassium: 4.3 mEq/L (ref 3.5–5.1)
Sodium: 137 mEq/L (ref 135–145)
Total Bilirubin: 0.6 mg/dL (ref 0.2–1.2)
Total Protein: 6.8 g/dL (ref 6.0–8.3)

## 2023-01-08 LAB — LDL CHOLESTEROL, DIRECT: Direct LDL: 64 mg/dL

## 2023-01-08 NOTE — Patient Instructions (Addendum)
Please check with your pharmacy to see if they have the shingrix vaccine. If they do- please get this immunization and update [place] by phone call or mychart with dates you receive the vaccine. Ok to get this even if you have not had known shingles case (most people had it even if very mild)   Trial gabapentin 100 mg twice daily - suspect this will help you with the pain in buttocks going into legs coming from your back stenosis. We could potentially gradually go up on dose as well such as 100 mg three times daily.   Please stop by lab before you go If you have mychart- we will send your results within 3 business days of [place] receiving them.  If you do not have mychart- we will call you about results within 5 business days of [place] receiving them.  *please also note that you will see labs on mychart as soon as they post. I will later go in and write notes on them- will say "notes from Dr. Durene Cal"   Recommended follow up: Return in about 5 months (around 06/10/2023) for physical or sooner if needed.Schedule b4 you leave.

## 2023-01-08 NOTE — Progress Notes (Signed)
Phone [phone] In person visit   Subjective:   Cindy Schwartz is a 89 y.o. year old very pleasant female patient who presents for/with See problem oriented charting Chief Complaint  Patient presents with   6 month follow-up   Past Medical History-  Patient Active Problem List   Diagnosis Date Noted   History of cerebellar stroke 10/05/2020    Priority: High   Memory loss 10/05/2020    Priority: High   history of Breast cancer of lower-outer quadrant of right female breast  01/16/2016    Priority: High   Lower abdominal pain 06/28/2010    Priority: High   Aneurysm of thoracic aorta 05/06/2007    Priority: High   History of ovarian cancer 09/22/1977    Priority: High   COPD (chronic obstructive pulmonary disease) 03/17/2022    Priority: Medium    History of adenomatous polyp of colon 12/30/2019    Priority: Medium    Mild hyperlipidemia 06/01/2019    Priority: Medium    Chronic cough 01/21/2018    Priority: Medium    Normal coronary arteries 10/17/2014    Priority: Medium    Hx of herpes simplex infection 12/18/2011    Priority: Medium    Esophageal reflux 05/06/2007    Priority: Medium    Osteoporosis 03/18/2007    Priority: Medium    Pain due to onychomycosis of toenails of both feet 12/25/2020    Priority: Low   Chronic idiopathic constipation 03/01/2019    Priority: Low   Genetic testing 07/09/2017    Priority: Low   Palpitations 11/13/2014    Priority: Low   Varicose veins of lower extremities with other complications 03/09/2012    Priority: Low   Generalized anxiety disorder 10/13/2008    Priority: Low   Irritable bowel syndrome 10/13/2008    Priority: Low   Lung nodule 11/25/2007    Priority: Low   Raynaud's syndrome 07/01/2007    Priority: Low   Allergic rhinitis 05/06/2007    Priority: Low   Osteoarthritis of carpometacarpal (CMC) joints of both thumbs 09/26/2021    Priority: 1.   Osteoarthritis of both hands 08/29/2020    Priority: 1.    Bilateral carpal tunnel syndrome 08/29/2020    Priority: 1.   Trigger thumb of left hand 08/29/2020    Priority: 1.   Degenerative arthritis of knee, bilateral 04/08/2019    Priority: 1.   Low back pain 06/28/2010    Priority: 1.   DEGENERATIVE DISC DISEASE, CERVICAL SPINE 05/06/2007    Priority: 1.   Bloating 07/31/2022   Left sided abdominal pain 07/31/2022   Belching 07/31/2022    Medications- reviewed and updated Current Outpatient Medications  Medication Sig Dispense Refill   Ascorbic Acid (VITAMIN C) 1000 MG tablet Take 1,000 mg by mouth daily.     aspirin EC (ASPIRIN LOW DOSE) 81 MG tablet TAKE 1 TABLET DAILY (SWALLOW WHOLE) 90 tablet 3   Cholecalciferol 25 MCG (1000 UT) capsule Take by mouth.     COLLAGEN PO Take by mouth.     esomeprazole (NEXIUM) 20 MG capsule Take 1 capsule (20 mg total) by mouth daily. 90 capsule 3   gabapentin (NEURONTIN) 100 MG capsule Take 100 mg by mouth 2 (two) times daily.     MAGNESIUM PO Take 300 mg by mouth.     rosuvastatin (CRESTOR) 5 MG tablet Take 1 tablet (5 mg total) by mouth daily. Take 1 tablet daily by mouth. 90 tablet 3   valACYclovir (  VALTREX) 500 MG tablet Take 1 tablet (500 mg total) by mouth 2 (two) times a week. 26 tablet 3   No current facility-administered medications for this visit.     Objective:  BP 130/84 (BP Location: Left Arm, Patient Position: Sitting)   Pulse 62   Temp 97.6 F (36.4 C) (Temporal)   Ht  (1.651 m)   Wt 136 lb 9.6 oz (62 kg)   LMP  (LMP Unknown)   SpO2 99%   BMI 22.73 kg/m  Gen: NAD, resting comfortably CV: RRR no murmurs rubs or gallops Lungs: CTAB no crackles, wheeze, rhonchi Ext: no edema Skin: warm, dry    Assessment and Plan   #History of cerebellar stroke with some balance and memory issues/hyperlipidemia S: Patient complained of issues with memory loss-on MRI of brain 09/17/2020-"Remote right cerebellar infarct."  We started aspirin 81 mg as well as rosuvastatin 5 mg daily with  LDL goal less than 70  Memory and balance issues could be associated with prior stroke Lab Results  Component Value Date   CHOL 129 07/09/2022   HDL 65.20 07/09/2022   LDLCALC 57 07/09/2022   LDLDIRECT 66.0 01/08/2022   TRIG 34.0 07/09/2022   CHOLHDL 2 07/09/2022  A/P: history of cerebrovascular accident - continue LDL goal under 70- at that last visit- continue current medications and update full lipid panel next visit   #lumbar stenosis- noted MRI September 2023 with DrDenyse Amass. Also saw Dr. Christell Constant orthopedics 10/17/22. Asks about seeing neurosurgery- she declines steroid injetion for now and wouldn't want surgery so we opted to lookk at medical options  -gabapentin 100 mg twice daily per scalp issues - from today "Trial gabapentin 100 mg twice daily - suspect this will help you with the pain in buttocks going into legs coming from your back stenosis. We could potentially gradually go up on dose as well such as 100 mg three times daily. "   #% Aneurysm of thoracic aorta S: Gets MRI/MRI with Dr. Laneta Simmers every other year-most recently Feb 04 2021.  Blood pressure goal less than 130/80 -Patient with normal coronary arteries in the past but still follows with Dr. Eden Emms every 6 months- palpitations with PVCs/PACs in the past A/P: scheduled may 22 for follow up with Dr. Laneta Simmers -blood pressure could be lower with aneurysm but we do not want to start more medicine unless we have to- gave info for dash eating plan    #% Esophageal reflux/history of chronic cough- has seen Dr. Marchelle Gearing in the past S: Compliant with Nexium  every  day plus pepcid per Dr. Russella Dar in 2023- ended up recently needing daily and works well A/P: reasonable control- continue current medications  #Osteoporosis S: Patient on calcium and vitamin D.  Took Fosamax for years.  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  DEXA October 2018 showed worst T score at -3.0 lumbar spine. On  different machine in 07/2019 worst t score - 2.2 RFN and LFN- also slighlty worse in 07/2021 -2020 declined Prolia -Nexium not ideal for osteoporosis but needs for reflux  A/P: osteoporosis noted- may be worsening. On vitamin D but no calcium other than diet. Will be due in November for repeat and will plan on that.  Getting some walking every other day at home for weight bearing exercise.   % History of breast cancer of lower-outer quadrant of right breast-mastectomy 2018.  No radiation or chemotherapy.  Follows with Dr. Dwain Sarna -she felt something  and is scheduled 01/14/23 for 3 days mammogram  #COPD- incidental finding- some cough in am- in past reflux thought could contribute. Has seen pulmonary in the past. Currently not bothersome- no shortness of breath- monitor for now  -also has some allergies- Dr. Jimmey Ralph prescribed astelin but she is not taking. Prefers nasal sinus rinse  #prior hyponatremia- reassuring workup September 2023 approximately- will monitor with labs  Recommended follow up: Return in about 5 months (around 06/10/2023) for physical or sooner if needed.Schedule b4 you leave. Future Appointments  Date Time Provider Department Center  01/14/2023 10:30 AM [facility] DIAG TOM[facility] 3 [facility]  01/14/2023 10:40 AM [facility] [place] 3 [facility]  01/27/2023  9:15 AM Freddie Breech, DPM [facility]  02/11/2023  1:20 PM GI-315 CT 1 GI-315CT [facility] [facility]  02/11/2023  2:30 PM Bartle, Payton Doughty, MD [facility]  02/12/2023  8:00 AM [facility] MM 2 [facility] GI-BRE[facility]  10/06/2023  8:00 AM [facility] ANNUAL WELLNESS VISIT 1 [facility] PEC   Lab/Order associations:   ICD-10-CM   1. Mild hyperlipidemia  E78.5 Comp Met (CMET)    Direct LDL    2. Chronic obstructive pulmonary disease, unspecified COPD type  J44.9     3. Thoracic aortic aneurysm without rupture, unspecified part Chronic I71.20       No orders of the defined types were placed in this  encounter.   Return precautions advised.  Tana Conch, MD

## 2023-01-09 ENCOUNTER — Encounter: Payer: Medicare Other | Admitting: Physical Therapy

## 2023-01-14 ENCOUNTER — Ambulatory Visit
Admission: RE | Admit: 2023-01-14 | Discharge: 2023-01-14 | Disposition: A | Discharge status: Home / Self Care | Payer: Medicare Other | Source: Ambulatory Visit | Attending: General Surgery | Admitting: General Surgery

## 2023-01-14 DIAGNOSIS — N644 Mastodynia: Secondary | ICD-10-CM

## 2023-01-14 LAB — HM MAMMOGRAPHY: HM Mammogram: NORMAL (ref 0–4)

## 2023-01-19 ENCOUNTER — Telehealth: Payer: Self-pay | Admitting: Cardiovascular Disease

## 2023-01-19 NOTE — Telephone Encounter (Signed)
Patient is calling because she states that her feels like it is working really hard. Patient stated that this comes and goes. Patient is concerned about her heart and would like a callback. Please advise.

## 2023-01-20 NOTE — Progress Notes (Unsigned)
Office Visit    Patient Name: Cindy Schwartz Date of Encounter: 01/21/2023  PCP:  Shelva Majestic, MD   [facility]  Cardiologist:  Charlton Haws, MD  Advanced Practice Provider:  No care team member to display Electrophysiologist:  None }  HPI    Cindy Schwartz is a 89 y.o. 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  The patient has 2 grandsons 1 is named Cindy Schwartz and is married living in [place] and the other living in [place].  She has been having some trouble with chronic back pain and her primary looks after this.  Echocardiogram 01/25/2018 showed AV sclerosis EF 60 to 65%, estimated PA pressure 34 mmHg.  Husband is 63 years passed away from ILD/lung cancer and pneumonia 05/14/2020 and patient has been even more depressed since then.  She has severe multifactorial spinal stenosis with cauda equina nerve root impingement of L3-4 and she had joint injection 07/07/2022.  Primary issue is arthritis and back pain.  Having good luck with PT/OT.  Today, she tells me she thinks she has been doing well. She was doing some yard work and she experienced some heaviness in her chest. She felt like it was a "heavy hearted" feeling. No SOB or swelling in her legs. She does something have some heart palpitations but nothing like it was. She currently does not need any medication for this. We have provided her with some nitro today but cautioned her that it could affect her BP which is already low normal so only take it as needed. She has an appointment later this month with Dr. Laneta Simmers as well as a CT scan to look at her aorta.   Reports no shortness of breath nor dyspnea on exertion. No edema, orthopnea, PND. Reports no palpitations.   Past Medical History    Past  Medical History:  Diagnosis Date   Aneurysm of ascending aorta (HCC)    a. 4.2-4.3cm in 2014.   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   Insomnia    Irregular heart beat    Normal coronary arteries Cath - 09/2014   Osteoporosis    Raynaud's disease    Past Surgical History:  Procedure Laterality Date   ABDOMINAL HYSTERECTOMY  1979   APPENDECTOMY     BILATERAL SALPINGOOPHORECTOMY     BREAST LUMPECTOMY     COLONOSCOPY  02/2012   LEFT HEART CATHETERIZATION WITH CORONARY ANGIOGRAM N/A 10/17/2014   Procedure: LEFT HEART CATHETERIZATION WITH CORONARY ANGIOGRAM;  Surgeon: Lennette Bihari, MD;  Location: [facility] [place] CATH LAB;  Service: Cardiovascular;  Laterality: N/A;   ovarian ca s/p taj/bso  1979  SIMPLE MASTECTOMY WITH AXILLARY SENTINEL NODE BIOPSY Right 05/05/2017   Procedure: RIGHT TOTAL MASTECTOMY WITH RIGHT  AXILLARY SENTINEL NODE BIOPSY;  Surgeon: Emelia Loron, MD;  Location: [facility] OR;  Service: General;  Laterality: Right;  PECTORAL BLOCK    Allergies  Allergies  Allergen Reactions   Prednisone     Fatigue and depressed mood on 7 day 40mg  x 3 days, 20 mg x 4 days from Dr. Durene Cal- intolerance   Chlorhexidine Gluconate Rash     EKGs/Labs/Other Studies Reviewed:   The following studies were reviewed today: Cardiac Studies & Procedures     STRESS TESTS  MYOCARDIAL PERFUSION IMAGING  01/22/2016   ECHOCARDIOGRAM  ECHOCARDIOGRAM COMPLETE 01/25/2018  Narrative *[facility]* [address] [place], [place] [address] [phone]  ------------------------------------------------------------------- Echocardiography  Patient:    Cindy Schwartz MR #:       [id number] Study Date: 01/25/2018 Gender:     F Age:        64 Height:     170.2 cm Weight:     64.7 kg BSA:        1.75 m^2 Pt. Status: Room:  ATTENDING    Charlton Haws, M.D. SONOGRAPHER  Harper, RDCS ORDERING     Shelva Majestic REFERRING    Shelva Majestic PERFORMING   [facility], Outpatient  cc:  ------------------------------------------------------------------- LV EF: 60% -   65%  ------------------------------------------------------------------- Indications:      Chronic cough (R05).  ------------------------------------------------------------------- History:   Risk factors:  Ascending aortic aneurysm. Palpitation. Breast cancer. Ovarian cancer.  ------------------------------------------------------------------- Study Conclusions  - Left ventricle: The cavity size was normal. Wall thickness was normal. Systolic function was normal. The estimated ejection fraction was in the range of 60% to 65%. Wall motion was normal; there were no regional wall motion abnormalities. Left ventricular diastolic function parameters were normal. - Atrial septum: No defect or patent foramen ovale was identified. - Pulmonary arteries: PA peak pressure: 34 mm Hg (S).  ------------------------------------------------------------------- Labs, prior tests, procedures, and surgery: Transthoracic echocardiography (10/17/2014).     EF was 50%.  ------------------------------------------------------------------- Study data:  Comparison was made to the study of 10/17/2014.  Study status:  Routine.  Procedure:  The patient reported no pain pre or post test. Transthoracic echocardiography. Image quality  was adequate.  Study completion:  There were no complications. Echocardiography.  M-mode, complete 2D, 3D, spectral Doppler, and color Doppler.  Birthdate:  Patient birthdate: 16-Mar-1934.  Age: Patient is 89 yr old.  Sex:  Gender: female.    BMI: 22.3 kg/m^2. Blood pressure:     108/76  Patient status:  Outpatient.  Study date:  Study date: 01/25/2018. Study time: 07:13 AM.  Location: [facility]  -------------------------------------------------------------------  ------------------------------------------------------------------- Left ventricle:  The cavity size was normal. Wall thickness was normal. Systolic function was normal. The estimated ejection fraction was in the range of 60% to 65%. Wall motion was normal; there were no regional wall motion abnormalities. The transmitral flow pattern was normal. The deceleration time of the early transmitral flow velocity was normal. The pulmonary vein flow pattern was normal. The tissue Doppler parameters were normal. Left ventricular diastolic function parameters were normal.  ------------------------------------------------------------------- Aortic valve:   Trileaflet; moderately thickened, moderately calcified leaflets. Mobility was not restricted.  Doppler: Transvalvular velocity was within the normal range. There was no stenosis. There was no regurgitation.  ------------------------------------------------------------------- Aorta:  The aorta was normal, not dilated, and non-diseased. Aortic root: The aortic root was normal in  size. Ascending aorta: The ascending aorta was mildly dilated.  ------------------------------------------------------------------- Mitral valve:   Structurally normal valve.   Mobility was not restricted.  Doppler:  Transvalvular velocity was within the normal range. There was no evidence for stenosis. There was trivial regurgitation.    Valve area by pressure half-time: 3.24 cm^2. Indexed valve  area by pressure half-time: 1.85 cm^2/m^2.    Peak gradient (D): 4 mm Hg.  ------------------------------------------------------------------- Left atrium:  The atrium was normal in size.  ------------------------------------------------------------------- Atrial septum:  No defect or patent foramen ovale was identified.  ------------------------------------------------------------------- Right ventricle:  The cavity size was normal. Wall thickness was normal. Systolic function was normal.  ------------------------------------------------------------------- Pulmonic valve:    Doppler:  Transvalvular velocity was within the normal range. There was no evidence for stenosis. There was mild regurgitation.  ------------------------------------------------------------------- Tricuspid valve:   Structurally normal valve.    Doppler: Transvalvular velocity was within the normal range. There was mild regurgitation.  ------------------------------------------------------------------- Pulmonary artery:   The main pulmonary artery was normal-sized. Systolic pressure was within the normal range.  ------------------------------------------------------------------- Right atrium:  The atrium was normal in size.  ------------------------------------------------------------------- Pericardium:  The pericardium was normal in appearance. There was no pericardial effusion.  ------------------------------------------------------------------- Systemic veins: Inferior vena cava: The vessel was normal in size. The respirophasic diameter changes were in the normal range (>= 50%), consistent with normal central venous pressure.  ------------------------------------------------------------------- Post procedure conclusions Ascending Aorta:  - The aorta was normal, not dilated, and non-diseased.  ------------------------------------------------------------------- Measurements  Left ventricle                           Value          Reference LV ID, ED, PLAX chordal         (L)     35    mm       43 - 52 LV ID, ES, PLAX chordal                 24    mm       23 - 38 LV fx shortening, PLAX chordal          31    %        >=29 LV PW thickness, ED                     8     mm       --------- IVS/LV PW ratio, ED                     0.88           <=1.3 Stroke volume, 2D                       74    ml       --------- Stroke volume/bsa, 2D                   42    ml/m^2   --------- LV e&', lateral                          8.38  cm/s     --------- LV E/e&', lateral                        11.49          ---------  LV e&', medial                           8.7   cm/s     --------- LV E/e&', medial                         11.07          --------- LV e&', average                          8.54  cm/s     --------- LV E/e&', average                        11.28          ---------  Ventricular septum                      Value          Reference IVS thickness, ED                       7     mm       ---------  LVOT                                    Value          Reference LVOT ID, S                              20    mm       --------- LVOT area                               3.14  cm^2     --------- LVOT peak velocity, S                   98.2  cm/s     --------- LVOT mean velocity, S                   58.7  cm/s     --------- LVOT VTI, S                             23.5  cm       ---------  Aorta                                   Value          Reference Aortic root ID, ED                      34    mm       --------- Ascending aorta ID, A-P, S              38    mm       ---------  Left atrium  Value          Reference LA ID, A-P, ES                          25    mm       --------- LA ID/bsa, A-P                          1.43  cm/m^2   <=2.2 LA volume, S                            33.2  ml       --------- LA volume/bsa, S                        19    ml/m^2    --------- LA volume, ES, 1-p A4C                  40.3  ml       --------- LA volume/bsa, ES, 1-p A4C              23    ml/m^2   --------- LA volume, ES, 1-p A2C                  27    ml       --------- LA volume/bsa, ES, 1-p A2C              15.4  ml/m^2   ---------  Mitral valve                            Value          Reference Mitral E-wave peak velocity             96.3  cm/s     --------- Mitral A-wave peak velocity             61.8  cm/s     --------- Mitral deceleration time        (H)     232   ms       150 - 230 Mitral pressure half-time               68    ms       --------- Mitral peak gradient, D                 4     mm Hg    --------- Mitral E/A ratio, peak                  1.6            --------- Mitral valve area, PHT, DP              3.24  cm^2     --------- Mitral valve area/bsa, PHT, DP          1.85  cm^2/m^2 ---------  Pulmonary arteries                      Value          Reference PA pressure, S, DP              (H)     34    mm Hg    <=30  Tricuspid valve                         Value          Reference Tricuspid regurg peak velocity          257   cm/s     --------- Tricuspid peak RV-RA gradient           26    mm Hg    ---------  Systemic veins                          Value          Reference Estimated CVP                           8     mm Hg    ---------  Right ventricle                         Value          Reference RV ID, minor axis, ED, A4C base         36    mm       --------- TAPSE                                   24.8  mm       --------- RV pressure, S, DP              (H)     34    mm Hg    <=30 RV s&', lateral, S                       9.14  cm/s     ---------  Legend: (L)  and  (H)  mark values outside specified reference range.  ------------------------------------------------------------------- Prepared and Electronically Authenticated by  Charlton Haws, M.D. 2019-05-06T11:55:38              EKG:  EKG is  ordered today.  NSR  rate 73 bpm. RBBB (old) Recent Labs: 11/06/2022: Hemoglobin 12.3; Platelets 233.0; TSH 1.01 01/08/2023: ALT 13; BUN 15; Creatinine, Ser 0.60; Potassium 4.3; Sodium 137  Recent Lipid Panel    Component Value Date/Time   CHOL 129 07/09/2022 0842   TRIG 34.0 07/09/2022 0842   HDL 65.20 07/09/2022 0842   CHOLHDL 2 07/09/2022 0842   VLDL 6.8 07/09/2022 0842   LDLCALC 57 07/09/2022 0842   LDLCALC 93 06/11/2020 0932   LDLDIRECT 64.0 01/08/2023 0945    Home Medications   Current Meds  Medication Sig   Ascorbic Acid (VITAMIN C) 1000 MG tablet Take 1,000 mg by mouth daily.   aspirin EC (ASPIRIN LOW DOSE) 81 MG tablet TAKE 1 TABLET DAILY (SWALLOW WHOLE)   COLLAGEN PO Take by mouth.   esomeprazole (NEXIUM) 20 MG capsule Take 1 capsule (20 mg total) by mouth daily.   gabapentin (NEURONTIN) 100 MG capsule Take 100 mg by mouth 2 (two) times daily.   MAGNESIUM PO Take 300 mg by mouth.   nitroGLYCERIN (NITROSTAT) 0.4 MG SL tablet Place 1 tablet (0.4 mg total) under the tongue every 5 (five) minutes as needed for chest pain.   rosuvastatin (CRESTOR) 5 MG tablet Take 1 tablet (5 mg total)  by mouth daily. Take 1 tablet daily by mouth.   valACYclovir (VALTREX) 500 MG tablet Take 1 tablet (500 mg total) by mouth 2 (two) times a week.     Review of Systems      All other systems reviewed and are otherwise negative except as noted above.  Physical Exam    VS:  BP 102/62   Pulse 75   Ht 5\' 5"  (1.651 m)   Wt 138 lb 6.4 oz (62.8 kg)   LMP  (LMP Unknown)   SpO2 97%   BMI 23.03 kg/m  , BMI Body mass index is 23.03 kg/m.  Wt Readings from Last 3 Encounters:  01/21/23 138 lb 6.4 oz (62.8 kg)  01/08/23 136 lb 9.6 oz (62 kg)  11/06/22 139 lb (63 kg)     GEN: Well nourished, well developed, in no acute distress. HEENT: normal. Neck: Supple, no JVD, carotid bruits, or masses. Cardiac: RRR, + systolic murmurs, rubs, or gallops. No clubbing, cyanosis, edema.  Radials/PT 2+ and equal bilaterally.   Respiratory:  Respirations regular and unlabored, clear to auscultation bilaterally. GI: Soft, nontender, nondistended. MS: No deformity or atrophy. Skin: Warm and dry, no rash. Neuro:  Strength and sensation are intact. Psych: Normal affect.  Assessment & Plan    Dizziness/lightheadedness -sometimes but not really, better -continue current medications  PVCs/PACs -she has not had this in a long time  -not on any medications at the moment  Breast cancer -s/p right mastectomy 05/05/17  Thoracic aortic aneurysm -she has a CT scan scheduled for later this month with Dr. Laneta Simmers -continue to monitor BP closely, low normal today  Systolic Murmur -echo scheduled today -thought to be due to AV sclerosis but not stenosis  HLD -Dr. Durene Cal looks after  -went over last cholesterol labs with her today -all values are at goal, continue crestor          Disposition: Follow up 6 months with Charlton Haws, MD or APP.  Signed, Sharlene Dory, PA-C 01/21/2023, 9:45 AM [facility]

## 2023-01-20 NOTE — Telephone Encounter (Signed)
Called patient back about her message. Patient stated she thinks she over did it while she was working in the yard. Patient stated she has some muscle aches, but feels like her heart is racing. Patient denies any chest pain or SOB. Patient's current  HR 90's. Informed patient she could take inderal as needed for palpitations. Patient is concerned and worried. Patient thinks she might just be having panic attacks. Will have patient come in for office visit with PA tomorrow to get evaluated. Informed patient that if her symptoms get worse to go to the ED. Will forward to Dr. Eden Emms for any further advisement.

## 2023-01-21 ENCOUNTER — Encounter: Payer: Self-pay | Admitting: Physician Assistant

## 2023-01-21 ENCOUNTER — Ambulatory Visit: Payer: Medicare Other | Attending: Physician Assistant | Admitting: Physician Assistant

## 2023-01-21 VITALS — BP 102/62 | HR 75 | Ht 65.0 in | Wt 138.4 lb

## 2023-01-21 DIAGNOSIS — I712 Thoracic aortic aneurysm, without rupture, unspecified: Secondary | ICD-10-CM

## 2023-01-21 DIAGNOSIS — R011 Cardiac murmur, unspecified: Secondary | ICD-10-CM

## 2023-01-21 DIAGNOSIS — R42 Dizziness and giddiness: Secondary | ICD-10-CM | POA: Diagnosis not present

## 2023-01-21 DIAGNOSIS — I493 Ventricular premature depolarization: Secondary | ICD-10-CM | POA: Diagnosis not present

## 2023-01-21 DIAGNOSIS — I491 Atrial premature depolarization: Secondary | ICD-10-CM

## 2023-01-21 MED ORDER — NITROGLYCERIN 0.4 MG SL SUBL: 0.4000 mg | SUBLINGUAL_TABLET | SUBLINGUAL | 3 refills | Status: AC | PRN

## 2023-01-21 NOTE — Patient Instructions (Signed)
Medication Instructions:   START TAKING:  NITROGLYCERIN 0.4 MG AS NEEDED  FOR CHEST PAIN   *If you need a refill on your cardiac medications before your next appointment, please call your pharmacy*   Lab Work:  NONE ORDERED  TODAY   If you have labs (blood work) drawn today and your tests are completely normal, you will receive your results only by: MyChart Message (if you have MyChart) OR A paper copy in the mail If you have any lab test that is abnormal or we need to change your treatment, we will call you to review the results.   Testing/Procedures: Your physician has requested that you have an echocardiogram. Echocardiography is a painless test that uses sound waves to create images of your heart. It provides your doctor with information about the size and shape of your heart and how well your heart's chambers and valves are working. This procedure takes approximately one hour. There are no restrictions for this procedure. Please do NOT wear cologne, perfume, aftershave, or lotions (deodorant is allowed). Please arrive 15 minutes prior to your appointment time.    Follow-Up: At [facility], you and your health needs are our priority.  As part of our continuing mission to provide you with exceptional heart care, we have created designated Provider Care Teams.  These Care Teams include your primary Cardiologist (physician) and Advanced Practice Providers (APPs -  Physician Assistants and Nurse Practitioners) who all work together to provide you with the care you need, when you need it.  We recommend signing up for the patient portal called "MyChart".  Sign up information is provided on this After Visit Summary.  MyChart is used to connect with patients for Virtual Visits (Telemedicine).  Patients are able to view lab/test results, encounter notes, upcoming appointments, etc.  Non-urgent messages can be sent to your provider as well.   To learn more about what you can do with  MyChart, go to [email or web address].    Your next appointment:   6 month(s)  Provider:   Charlton Haws, MD     Other Instructions

## 2023-01-27 ENCOUNTER — Ambulatory Visit (INDEPENDENT_AMBULATORY_CARE_PROVIDER_SITE_OTHER): Payer: Medicare Other | Admitting: Podiatry

## 2023-01-27 ENCOUNTER — Encounter: Payer: Self-pay | Admitting: Podiatry

## 2023-01-27 DIAGNOSIS — M79672 Pain in left foot: Secondary | ICD-10-CM

## 2023-01-27 DIAGNOSIS — Q828 Other specified congenital malformations of skin: Secondary | ICD-10-CM | POA: Diagnosis not present

## 2023-01-27 DIAGNOSIS — M79675 Pain in left toe(s): Secondary | ICD-10-CM | POA: Diagnosis not present

## 2023-01-27 DIAGNOSIS — M79674 Pain in right toe(s): Secondary | ICD-10-CM

## 2023-01-27 DIAGNOSIS — M79671 Pain in right foot: Secondary | ICD-10-CM | POA: Diagnosis not present

## 2023-01-27 DIAGNOSIS — B351 Tinea unguium: Secondary | ICD-10-CM | POA: Diagnosis not present

## 2023-01-27 NOTE — Progress Notes (Signed)
  Subjective:  Patient ID: Cindy Schwartz, female    DOB: Aug 11, 1934,  MRN: [id number]  Floretta Marucci presents to clinic today for painful porokeratotic lesion(s) both feet and painful mycotic toenails that limit ambulation. Painful toenails interfere with ambulation. Aggravating factors include wearing enclosed shoe gear. Pain is relieved with periodic professional debridement. Painful porokeratotic lesions are aggravated when weightbearing with and without shoegear. Pain is relieved with periodic professional debridement.  Chief Complaint  Patient presents with   Nail Problem    [facility] PCP-[facility] PCP VST-12/2022   New problem(s): Patient states she has a painful lesion on her left heel now and would like it addressed.  PCP is Shelva Majestic, MD.  Allergies  Allergen Reactions   Prednisone     Fatigue and depressed mood on 7 day 40mg  x 3 days, 20 mg x 4 days from Dr. Durene Cal- intolerance   Chlorhexidine Gluconate Rash    Review of Systems: Negative except as noted in the HPI.  Objective:  There were no vitals filed for this visit. Cindy Schwartz is a pleasant 89 y.o. female WD, WN in NAD. AAO x 3.  Vascular Examination: CFT <3 seconds b/l LE. Palpable DP pulse(s) b/l LE. Palpable PT pulse(s) b/l LE. Pedal hair sparse. No pain with calf compression b/l. No edema noted b/l LE. Varicosities present b/l. No cyanosis or clubbing noted b/l LE.Marland Kitchen  Dermatological Examination: Pedal skin thin and atrophic b/l LE. No open wounds b/l LE. No interdigital macerations noted b/l LE.   Toenails 1-5 b/l elongated, discolored, dystrophic, thickened, crumbly with subungual debris and tenderness to dorsal palpation.   Porokeratotic lesion(s) submet head 2 b/l and plantarcentral heelpad left foot. No erythema, no edema, no drainage, no fluctuance.  Neurological Examination: Protective sensation intact with 10 gram monofilament b/l LE. Vibratory sensation intact b/l LE.   Musculoskeletal  Examination: Muscle strength 5/5 to all LE muscle groups b/l. Plantarflexed metatarsal(s) 2nd metatarsal head b/l lower extremities.  Assessment/Plan: 1. Pain due to onychomycosis of toenails of both feet   2. Porokeratosis   3. Pain in both feet     -Patient was evaluated and treated. All patient's and/or POA's questions/concerns answered on today's visit. -Toenails were debrided in length and girth 1-5 bilaterally with sterile nail nippers and dremel without iatrogenic bleeding.  -Porokeratotic lesion(s) left heel and submet head 2 b/l pared and enucleated with sterile currette. Total number of lesions debrided=3. [place] bleeding left heel addressed with Lumicain. Area cleansed with alcohol. TAO applied. No further treatment required by patient. -Patient/POA to call should there be question/concern in the interim.   Return in about 3 months (around 04/29/2023).  Cindy Schwartz, DPM

## 2023-02-11 ENCOUNTER — Ambulatory Visit
Admission: RE | Admit: 2023-02-11 | Discharge: 2023-02-11 | Disposition: A | Discharge status: Home / Self Care | Payer: Medicare Other | Source: Ambulatory Visit | Attending: Surgery | Admitting: Surgery

## 2023-02-11 ENCOUNTER — Ambulatory Visit (INDEPENDENT_AMBULATORY_CARE_PROVIDER_SITE_OTHER): Payer: Medicare Other | Admitting: Surgery

## 2023-02-11 VITALS — BP 150/88 | HR 62 | Resp 20 | Ht 65.0 in

## 2023-02-11 DIAGNOSIS — I7121 Aneurysm of the ascending aorta, without rupture: Secondary | ICD-10-CM | POA: Diagnosis not present

## 2023-02-11 MED ORDER — IOPAMIDOL (ISOVUE-370) INJECTION 76%
75.0000 mL | Freq: Once | INTRAVENOUS | Status: AC | PRN
  Administered 2023-02-11: 75 mL via INTRAVENOUS

## 2023-02-11 NOTE — Progress Notes (Signed)
    HPI: ***  Current Outpatient Medications  Medication Sig Dispense Refill   Ascorbic Acid (VITAMIN C) 1000 MG tablet Take 1,000 mg by mouth daily.     aspirin EC (ASPIRIN LOW DOSE) 81 MG tablet TAKE 1 TABLET DAILY (SWALLOW WHOLE) 90 tablet 3   Cholecalciferol 25 MCG (1000 UT) capsule Take by mouth. (Patient not taking: Reported on 01/21/2023)     COLLAGEN PO Take by mouth.     esomeprazole (NEXIUM) 20 MG capsule Take 1 capsule (20 mg total) by mouth daily. 90 capsule 3   gabapentin (NEURONTIN) 100 MG capsule Take 100 mg by mouth 2 (two) times daily.     MAGNESIUM PO Take 300 mg by mouth.     nitroGLYCERIN (NITROSTAT) 0.4 MG SL tablet Place 1 tablet (0.4 mg total) under the tongue every 5 (five) minutes as needed for chest pain. 25 tablet 3   rosuvastatin (CRESTOR) 5 MG tablet Take 1 tablet (5 mg total) by mouth daily. Take 1 tablet daily by mouth. 90 tablet 3   valACYclovir (VALTREX) 500 MG tablet Take 1 tablet (500 mg total) by mouth 2 (two) times a week. 26 tablet 3   No current facility-administered medications for this visit.     Physical Exam: ***  Diagnostic Tests: ***  Impression: ***  Plan: ***   Alleen Borne, MD [facility] [phone]

## 2023-02-12 ENCOUNTER — Ambulatory Visit: Payer: Medicare Other

## 2023-02-24 ENCOUNTER — Ambulatory Visit (HOSPITAL_COMMUNITY): Payer: Medicare Other | Attending: Physician Assistant

## 2023-02-24 DIAGNOSIS — R011 Cardiac murmur, unspecified: Secondary | ICD-10-CM | POA: Diagnosis present

## 2023-02-24 LAB — ECHOCARDIOGRAM COMPLETE
Area-P 1/2: 3.07 cm2
S' Lateral: 2.4 cm

## 2023-03-06 NOTE — Progress Notes (Signed)
Evaluation Performed:  Follow-up visit  Date:  03/19/2023   ID:  Cindy Schwartz, DOB August 22, 1934, MRN [id number]  PCP:  Shelva Majestic, MD  Cardiologist:  Charlton Haws, MD   Electrophysiologist:  None   Chief Complaint:  Palpitations/ Dizziness  History of Present Illness:    89 y.o. with history of HTN, fusiform aortic root dilatation 4.1 cm.  Chest pain January 2016 with normal cardiac catheterization. Palpitations with monitor only showing PACs;/PVC;s  Breast cancer with mastectomy August 2018. Husband diagnosed with lung cancer at same time.   Has two grandson's one Cristal Deer married living in [place] and one Precious Bard has been in [place] and [place] cooking with wife Daughter in [place]    Having some chronic back pain Dr Mardelle Matte primary ordered plan films 07/31/20 showed chronic stable L1 compression deformity  Echo 02/24/23 EF 60-65% no significant valve dx CTA 02/11/23 4.4 cm ascending thoracic aortic aneurysm  Husband of 63 years finally passes of ILD/Lung Cancer and pneumonia 05/14/20 She has been even more depressed   She has severe multifactorial spinal canal stenosis with cauda equina nerve root impingement L34 She had joint injection 07/07/22  Primary issue is arthritis in left thumb and back pain Having good luck with PT/OT  She was concerned about the mild TR on her TTE and I reassured her this is not an issue  Past Medical History:  Diagnosis Date   Aneurysm of ascending aorta (HCC)    a. 4.2-4.3cm in 2014.   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   Insomnia    Irregular heart beat    Normal coronary arteries Cath - 09/2014   Osteoporosis    Raynaud's disease    Past Surgical History:  Procedure Laterality Date   ABDOMINAL HYSTERECTOMY  1979   APPENDECTOMY     BILATERAL SALPINGOOPHORECTOMY     BREAST LUMPECTOMY     COLONOSCOPY  02/2012   LEFT HEART CATHETERIZATION WITH CORONARY ANGIOGRAM N/A 10/17/2014   Procedure: LEFT HEART CATHETERIZATION WITH CORONARY ANGIOGRAM;  Surgeon: Lennette Bihari, MD;  Location: [facility] CATH LAB;  Service: Cardiovascular;  Laterality: N/A;   ovarian ca s/p taj/bso  1979   SIMPLE MASTECTOMY WITH AXILLARY SENTINEL NODE BIOPSY Right 05/05/2017   Procedure: RIGHT TOTAL MASTECTOMY WITH RIGHT  AXILLARY SENTINEL NODE BIOPSY;  Surgeon: Emelia Loron, MD;  Location: [facility] OR;  Service: General;  Laterality: Right;  PECTORAL BLOCK     Current Meds  Medication Sig   Ascorbic Acid (VITAMIN C) 1000 MG tablet Take 1,000 mg by mouth daily.   aspirin EC (ASPIRIN LOW DOSE) 81 MG tablet TAKE 1 TABLET DAILY (SWALLOW WHOLE)   Cholecalciferol 25 MCG (1000 UT) capsule Take by mouth.   COLLAGEN PO Take by mouth.   dicyclomine (BENTYL) 10 MG  capsule Take 1 capsule (10 mg total) by mouth 4 (four) times daily -  before meals and at bedtime.   esomeprazole (NEXIUM) 20 MG capsule Take 1 capsule (20 mg total) by mouth daily.   gabapentin (NEURONTIN) 100 MG capsule Take 100 mg by mouth 2 (two) times daily.   MAGNESIUM PO Take 300 mg by mouth.   nitroGLYCERIN (NITROSTAT) 0.4 MG SL tablet Place 1 tablet (0.4 mg total) under the tongue every 5 (five) minutes as needed for chest pain.   rosuvastatin (CRESTOR) 5 MG tablet Take 1 tablet (5 mg total) by mouth daily. Take 1 tablet daily by mouth.   TART CHERRY PO Take 1 Capful by mouth daily.    valACYclovir (VALTREX) 500 MG tablet Take 1 tablet (500 mg total) by mouth 2 (two) times a week.     Allergies:   Prednisone and Chlorhexidine gluconate   Social History   Tobacco Use   Smoking status: Never   Smokeless tobacco: Never  Vaping Use   Vaping Use: Never used  Substance Use Topics   Alcohol use: No   Drug use: No     Family Hx: The patient's family history includes Breast cancer (age of onset: 73) in her cousin; Colon polyps in her brother; Esophageal cancer (age of onset: 23) in her cousin; Healthy in her brother; Hip fracture in her mother; Other in her father. There is no history of Colon cancer, Pancreatic cancer, or Stomach cancer.  ROS:   Please see the history of present illness.     All other systems reviewed and are negative.   Prior CV studies:   The following studies were reviewed today:  Echo 02/24/23  CTA 02/11/23   Labs/Other Tests and Data Reviewed:    EKG  SR RBBB no acute changes   Recent Labs: 11/06/2022: TSH 1.01 03/09/2023: ALT 14; BUN 17; Creatinine, Ser 0.64; Hemoglobin 13.1; Platelets 227.0; Potassium 4.3; Sodium 133   Recent Lipid Panel Lab Results  Component Value Date/Time   CHOL 129 07/09/2022 08:42 AM   TRIG 34.0 07/09/2022 08:42 AM   HDL 65.20 07/09/2022 08:42 AM   CHOLHDL 2 07/09/2022 08:42 AM   LDLCALC 57 07/09/2022 08:42 AM   LDLCALC 93 06/11/2020 09:32 AM   LDLDIRECT 64.0 01/08/2023 09:45 AM    Wt Readings from Last 3 Encounters:  03/19/23 136 lb 12.8 oz (62.1 kg)  03/09/23 137 lb (62.1 kg)  01/21/23 138 lb 6.4 oz (62.8 kg)     Objective:    Vital Signs:  BP 116/64   Pulse 73   Ht 5\' 5"  (1.651 m)   Wt 136 lb 12.8 oz (62.1 kg)   LMP  (LMP Unknown)   SpO2 96%   BMI 22.76 kg/m    Depressed  Well nourished, well developed female in no acute distress. Skin warm and dry No tachypnea Post right mastectomy No JVP elevation No edema 2/6 SEM  Plus 3 PT/DP  ASSESSMENT & PLAN:      1. Dizziness/  Lightheadedness:  -non cardiac benign holter 09/25/15 normal exam and no CAD at cath 2016    2. H/o PVCs/PAC:  -benign PRN Inderal    3. Breast CA:   -Post right mastectomy Dwain Sarna 05/05/17 F/U oncology    4. Stress:  -depression related to husbands death consoled f/u primary    5. Thoracic aortic aneurysm:  -followed by Dr. Laneta Simmers. 4.4 cm by CTA 02/11/23   6. Murmur: - no change on exam AV sclerosis  7. Cholesterol:   -LDL 56 labs done 12/11/20  continue low dose crestor    8. Cough:  - Cyclic sees pulmonary continue flonase and anti reflux measures atrovent was added Suspect pollen and allergies making things worse currently F/U Dr Durene Cal  9. Lumbago:  spinal stenosis L34 post injection 07/07/22 f/u   Medication Adjustments/Labs and Tests Ordered: Current medicines are reviewed at length with the patient today.  Concerns regarding medicines are outlined above.  Tests Ordered:  None   Medication Changes: No orders of the defined types were placed in this encounter.   Disposition:  Follow up in a year   Signed, Charlton Haws, MD  03/19/2023 10:55 AM    [facility]

## 2023-03-09 ENCOUNTER — Other Ambulatory Visit: Payer: Self-pay

## 2023-03-09 ENCOUNTER — Encounter: Payer: Self-pay | Admitting: Gastroenterology

## 2023-03-09 ENCOUNTER — Ambulatory Visit (INDEPENDENT_AMBULATORY_CARE_PROVIDER_SITE_OTHER): Payer: Medicare Other | Admitting: Gastroenterology

## 2023-03-09 ENCOUNTER — Other Ambulatory Visit (INDEPENDENT_AMBULATORY_CARE_PROVIDER_SITE_OTHER): Payer: Medicare Other

## 2023-03-09 VITALS — BP 120/70 | HR 75 | Ht 65.0 in | Wt 137.0 lb

## 2023-03-09 DIAGNOSIS — R1012 Left upper quadrant pain: Secondary | ICD-10-CM

## 2023-03-09 DIAGNOSIS — R1030 Lower abdominal pain, unspecified: Secondary | ICD-10-CM

## 2023-03-09 DIAGNOSIS — R319 Hematuria, unspecified: Secondary | ICD-10-CM

## 2023-03-09 LAB — CBC WITH DIFFERENTIAL/PLATELET
Basophils Absolute: 0 10*3/uL (ref 0.0–0.1)
Basophils Relative: 0.7 % (ref 0.0–3.0)
Eosinophils Absolute: 0.2 10*3/uL (ref 0.0–0.7)
Eosinophils Relative: 3 % (ref 0.0–5.0)
HCT: 39.9 % (ref 36.0–46.0)
Hemoglobin: 13.1 g/dL (ref 12.0–15.0)
Lymphocytes Relative: 26.8 % (ref 12.0–46.0)
Lymphs Abs: 1.9 10*3/uL (ref 0.7–4.0)
MCHC: 32.8 g/dL (ref 30.0–36.0)
MCV: 85.5 fl (ref 78.0–100.0)
Monocytes Absolute: 0.7 10*3/uL (ref 0.1–1.0)
Monocytes Relative: 10.2 % (ref 3.0–12.0)
Neutro Abs: 4.2 10*3/uL (ref 1.4–7.7)
Neutrophils Relative %: 59.3 % (ref 43.0–77.0)
Platelets: 227 10*3/uL (ref 150.0–400.0)
RBC: 4.66 Mil/uL (ref 3.87–5.11)
RDW: 14.9 % (ref 11.5–15.5)
WBC: 7.1 10*3/uL (ref 4.0–10.5)

## 2023-03-09 LAB — COMPREHENSIVE METABOLIC PANEL
ALT: 14 U/L (ref 0–35)
AST: 20 U/L (ref 0–37)
Albumin: 4.2 g/dL (ref 3.5–5.2)
Alkaline Phosphatase: 80 U/L (ref 39–117)
BUN: 17 mg/dL (ref 6–23)
CO2: 28 mEq/L (ref 19–32)
Calcium: 9.6 mg/dL (ref 8.4–10.5)
Chloride: 97 mEq/L (ref 96–112)
Creatinine, Ser: 0.64 mg/dL (ref 0.40–1.20)
GFR: 79.57 mL/min (ref >60.00)
Glucose, Bld: 83 mg/dL (ref 70–99)
Potassium: 4.3 mEq/L (ref 3.5–5.1)
Sodium: 133 mEq/L — ABNORMAL LOW (ref 135–145)
Total Bilirubin: 0.5 mg/dL (ref 0.2–1.2)
Total Protein: 7.5 g/dL (ref 6.0–8.3)

## 2023-03-09 LAB — URINALYSIS, ROUTINE W REFLEX MICROSCOPIC
Bilirubin Urine: NEGATIVE
Ketones, ur: NEGATIVE
Leukocytes,Ua: NEGATIVE
Nitrite: NEGATIVE
Specific Gravity, Urine: 1.01 (ref 1.000–1.030)
Total Protein, Urine: NEGATIVE
Urine Glucose: NEGATIVE
Urobilinogen, UA: 0.2 (ref 0.0–1.0)
pH: 7 (ref 5.0–8.0)

## 2023-03-09 LAB — LIPASE: Lipase: 38 U/L (ref 11.0–59.0)

## 2023-03-09 MED ORDER — DICYCLOMINE HCL 10 MG PO CAPS: 10.0000 mg | ORAL_CAPSULE | Freq: Three times a day (TID) | ORAL | 11 refills | Status: DC

## 2023-03-09 NOTE — Patient Instructions (Signed)
Your provider has requested that you go to the basement level for lab work before leaving today. Press "B" on the elevator. The lab is located at the first door on the left as you exit the elevator.  We have sent the following medications to your pharmacy for you to pick up at your convenience: dicyclomine.   You have been scheduled for a CT scan of the abdomen and pelvis at [facility], 1st floor Radiology. You are scheduled on 03/18/23 at 2:00pm. You should arrive at 12:00pm to drink contrast.   1) Do not eat anything after 10:00am (4 hours prior to your test)     You may take any medications as prescribed with a small amount of water, if necessary. If you take any of the following medications: METFORMIN, GLUCOPHAGE, GLUCOVANCE, AVANDAMET, RIOMET, FORTAMET, ACTOPLUS MET, JANUMET, GLUMETZA or METAGLIP, you MAY be asked to HOLD this medication 48 hours AFTER the exam.   The purpose of you drinking the oral contrast is to aid in the visualization of your intestinal tract. The contrast solution may cause some diarrhea. Depending on your individual set of symptoms, you may also receive an intravenous injection of x-ray contrast/dye.   If you have any questions regarding your exam or if you need to reschedule, you may call [facility] at [phone] between the hours of 8:00 am and 5:00 pm, Monday-Friday.

## 2023-03-09 NOTE — Progress Notes (Signed)
    Assessment     Lower abdominal pain, LUQ pain - suspected IBS. R/O diverticulitis, UTI CIC Sigmoid colon diverticulosis Esophageal dysmotility GERD, reactive gastropathy   Recommendations    CBC, CMP, lipase, UA Schedule CT AP Dicyclomine 10 mg po qid prn Continue magnesium daily for constipation Continue Nexium 20 mg every day, antireflux measures  Further plans based on above   HPI: This is an 88 year old female with lower abdominal pain and LUQ pain for 3 weeks. Symptoms often increase after meals. Mild constipation persists.  She has had a mild change in bowel habits with occasional thinner stools and more frequent stools.  She recently started a magnesium supplement for constipation.  She relates significant fatigue over the past few weeks.  She had diarrhea illness in Feb 2024. Campylobacter was diagnosed and treated.  Her reflux symptoms are controlled. Denies weight loss, melena, hematochezia, nausea, vomiting, dysphagia, chest pain.    Labs / Imaging       Latest Ref Rng & Units 01/08/2023    9:45 AM 11/06/2022   11:54 AM 07/09/2022    8:42 AM  Hepatic Function  Total Protein 6.0 - 8.3 g/dL 6.8  6.4  7.3   Albumin 3.5 - 5.2 g/dL 4.2  3.7  4.2   AST 0 - 37 U/L 20  24  21    ALT 0 - 35 U/L 13  16  16    Alk Phosphatase 39 - 117 U/L 79  60  71   Total Bilirubin 0.2 - 1.2 mg/dL 0.6  0.4  0.5        Latest Ref Rng & Units 11/06/2022   11:54 AM 07/09/2022    8:42 AM 05/15/2022    1:17 PM  CBC  WBC 4.0 - 10.5 K/uL 6.8  7.5  6.9   Hemoglobin 12.0 - 15.0 g/dL 16.1  09.6  04.5   Hematocrit 36.0 - 46.0 % 36.5  39.3  36.8   Platelets 150.0 - 400.0 K/uL 233.0  260.0  260.0    Current Medications, Allergies, Past Medical History, Past Surgical History, Family History and Social History were reviewed in [employer] record.   Physical Exam: General: Well developed, well nourished, no acute distress Head: Normocephalic and atraumatic Eyes:  Sclerae anicteric, EOMI Ears: Normal auditory acuity Mouth: No deformities or lesions noted Lungs: Clear throughout to auscultation Heart: Regular rate and rhythm; No murmurs, rubs or bruits Abdomen: Soft, mild lower abdominal and mild LUQ tenderness, non distended. No masses, hepatosplenomegaly or hernias noted. Normal Bowel sounds Rectal: Not done  Musculoskeletal: Symmetrical with no gross deformities  Pulses:  Normal pulses noted Extremities: No edema or deformities noted Neurological: Alert oriented x 4, grossly nonfocal Psychological:  Alert and cooperative. Normal mood and affect   Rhylan Gross T. Russella Dar, MD 03/09/2023, 9:46 AM

## 2023-03-10 ENCOUNTER — Telehealth: Payer: Self-pay | Admitting: Family Medicine

## 2023-03-10 NOTE — Telephone Encounter (Signed)
Patient returned call. Requests to be called. 

## 2023-03-10 NOTE — Telephone Encounter (Signed)
Called and lm on pt vm tcb. 

## 2023-03-10 NOTE — Telephone Encounter (Signed)
Called and spoke with pt and all questions answered. 

## 2023-03-10 NOTE — Telephone Encounter (Signed)
Pt would like a call back concerning her urine results. She would like to know a little bit more about the results. Please advise.

## 2023-03-18 ENCOUNTER — Ambulatory Visit
Admission: RE | Admit: 2023-03-18 | Discharge: 2023-03-18 | Disposition: A | Discharge status: Home / Self Care | Payer: Medicare Other | Source: Ambulatory Visit | Attending: Gastroenterology | Admitting: Gastroenterology

## 2023-03-18 DIAGNOSIS — I712 Thoracic aortic aneurysm, without rupture, unspecified: Secondary | ICD-10-CM | POA: Insufficient documentation

## 2023-03-18 DIAGNOSIS — R1012 Left upper quadrant pain: Secondary | ICD-10-CM | POA: Insufficient documentation

## 2023-03-18 DIAGNOSIS — R011 Cardiac murmur, unspecified: Secondary | ICD-10-CM | POA: Diagnosis present

## 2023-03-18 DIAGNOSIS — R1030 Lower abdominal pain, unspecified: Secondary | ICD-10-CM | POA: Diagnosis present

## 2023-03-18 MED ORDER — SODIUM CHLORIDE (PF) 0.9 % IJ SOLN
INTRAMUSCULAR | Status: AC
  Filled 2023-03-18: qty 50

## 2023-03-18 MED ORDER — IOHEXOL 9 MG/ML PO SOLN
ORAL | Status: AC
  Filled 2023-03-18: qty 1000

## 2023-03-18 MED ORDER — IOHEXOL 9 MG/ML PO SOLN
500.0000 mL | ORAL | Status: AC
  Administered 2023-03-18 (×2): 500 mL via ORAL

## 2023-03-18 MED ORDER — IOHEXOL 300 MG/ML  SOLN
100.0000 mL | Freq: Once | INTRAMUSCULAR | Status: AC | PRN
  Administered 2023-03-18: 100 mL via INTRAVENOUS

## 2023-03-19 ENCOUNTER — Ambulatory Visit (INDEPENDENT_AMBULATORY_CARE_PROVIDER_SITE_OTHER): Payer: Medicare Other | Admitting: Cardiovascular Disease

## 2023-03-19 ENCOUNTER — Encounter: Payer: Self-pay | Admitting: Cardiovascular Disease

## 2023-03-19 VITALS — BP 116/64 | HR 73 | Ht 65.0 in | Wt 136.8 lb

## 2023-03-19 DIAGNOSIS — R011 Cardiac murmur, unspecified: Secondary | ICD-10-CM

## 2023-03-19 DIAGNOSIS — I712 Thoracic aortic aneurysm, without rupture, unspecified: Secondary | ICD-10-CM

## 2023-03-19 NOTE — Patient Instructions (Signed)
Medication Instructions:  Your physician recommends that you continue on your current medications as directed. Please refer to the Current Medication list given to you today.  *If you need a refill on your cardiac medications before your next appointment, please call your pharmacy*  Lab Work: If you have labs (blood work) drawn today and your tests are completely normal, you will receive your results only by: MyChart Message (if you have MyChart) OR A paper copy in the mail If you have any lab test that is abnormal or we need to change your treatment, we will call you to review the results.  Testing/Procedures: None ordered today.  Follow-Up: At [facility], you and your health needs are our priority.  As part of our continuing mission to provide you with exceptional heart care, we have created designated Provider Care Teams.  These Care Teams include your primary Cardiologist (physician) and Advanced Practice Providers (APPs -  Physician Assistants and Nurse Practitioners) who all work together to provide you with the care you need, when you need it.  We recommend signing up for the patient portal called "MyChart".  Sign up information is provided on this After Visit Summary.  MyChart is used to connect with patients for Virtual Visits (Telemedicine).  Patients are able to view lab/test results, encounter notes, upcoming appointments, etc.  Non-urgent messages can be sent to your provider as well.   To learn more about what you can do with MyChart, go to [email or web address].    Your next appointment:   1 year(s)  Provider:   Peter Nishan, MD      

## 2023-03-23 ENCOUNTER — Telehealth: Payer: Self-pay | Admitting: Gastroenterology

## 2023-03-23 ENCOUNTER — Other Ambulatory Visit (INDEPENDENT_AMBULATORY_CARE_PROVIDER_SITE_OTHER): Payer: Medicare Other

## 2023-03-23 DIAGNOSIS — R319 Hematuria, unspecified: Secondary | ICD-10-CM

## 2023-03-23 LAB — URINALYSIS, ROUTINE W REFLEX MICROSCOPIC
Bilirubin Urine: NEGATIVE
Ketones, ur: NEGATIVE
Leukocytes,Ua: NEGATIVE
Nitrite: NEGATIVE
Specific Gravity, Urine: 1.01 (ref 1.000–1.030)
Total Protein, Urine: NEGATIVE
Urine Glucose: NEGATIVE
Urobilinogen, UA: 0.2 (ref 0.0–1.0)
WBC, UA: NONE SEEN (ref 0–?)
pH: 7.5 (ref 5.0–8.0)

## 2023-03-23 NOTE — Telephone Encounter (Signed)
Inbound call from patient returning phone call regarding recent imaging results. Requesting a call back. Please advise, thank you.

## 2023-03-23 NOTE — Telephone Encounter (Signed)
See results note dated 7/1

## 2023-04-08 ENCOUNTER — Ambulatory Visit (INDEPENDENT_AMBULATORY_CARE_PROVIDER_SITE_OTHER): Payer: Medicare Other | Admitting: Family Medicine

## 2023-04-08 VITALS — BP 110/60 | HR 78 | Temp 98.3°F | Ht 65.0 in | Wt 138.2 lb

## 2023-04-08 DIAGNOSIS — J302 Other seasonal allergic rhinitis: Secondary | ICD-10-CM | POA: Diagnosis not present

## 2023-04-08 DIAGNOSIS — J0100 Acute maxillary sinusitis, unspecified: Secondary | ICD-10-CM | POA: Diagnosis not present

## 2023-04-08 MED ORDER — FLUTICASONE PROPIONATE 50 MCG/ACT NA SUSP
1.0000 | Freq: Every day | NASAL | 0 refills | Status: DC
Start: 2023-04-08 — End: 2023-10-06

## 2023-04-08 MED ORDER — AMOXICILLIN 875 MG PO TABS: 875.0000 mg | ORAL_TABLET | Freq: Two times a day (BID) | ORAL | 0 refills | Status: AC

## 2023-04-08 NOTE — Progress Notes (Signed)
Subjective  CC:  Chief Complaint  Patient presents with   Nasal Congestion    Pt stated that for the past 3 weeks she has been experiencing some post nasal drip, head hurting when she lays down and eyes are heavy and throat irritated   Same day acute visit; PCP not available. Chart reviewed.   HPI: Cindy Schwartz is a 89 y.o. female who presents to the office today to address the problems listed above in the chief complaint. Very pleasant 89 year old female says she has not been feeling well for several weeks.  Complains of postnasal drainage, occasional cough and throat irritation, fullness in her head causing a mild headache when she lies down and sinus congestion.  She has malaise, feeling more tired than usual.  No fevers or chills.  Some sneezing.  No dental pain.  No shortness of breath.  No GI symptoms.  Assessment  1. Subacute maxillary sinusitis   2. Seasonal allergic rhinitis, unspecified trigger      Plan  Symptom complex most consistent with possible sinus infection and allergies: Education given.  Will treat with antibiotics, Flonase and oral antihistamine like Claritin.  She will monitor symptoms over the next couple weeks and let me know if they do not change.  No red flag symptoms present.  Exam mostly unremarkable except for left maxillary sinus tenderness and nasal mucosa inflammation.  Follow up: As needed 06/05/2023  No orders of the defined types were placed in this encounter.  No orders of the defined types were placed in this encounter.     I reviewed the patients updated PMH, FH, and SocHx.    Patient Active Problem List   Diagnosis Date Noted   Lumbar stenosis 01/08/2023   Bloating 07/31/2022   Left sided abdominal pain 07/31/2022   Belching 07/31/2022   COPD (chronic obstructive pulmonary disease) (HCC) 03/17/2022   Osteoarthritis of carpometacarpal (CMC) joints of both thumbs 09/26/2021   Pain due to onychomycosis of toenails of both feet 12/25/2020    History of cerebellar stroke 10/05/2020   Memory loss 10/05/2020   Osteoarthritis of both hands 08/29/2020   Bilateral carpal tunnel syndrome 08/29/2020   Trigger thumb of left hand 08/29/2020   History of adenomatous polyp of colon 12/30/2019   Mild hyperlipidemia 06/01/2019   Degenerative arthritis of knee, bilateral 04/08/2019   Chronic idiopathic constipation 03/01/2019   Chronic cough 01/21/2018   Genetic testing 07/09/2017   history of Breast cancer of lower-outer quadrant of right female breast  01/16/2016   Palpitations 11/13/2014   Normal coronary arteries 10/17/2014   Varicose veins of lower extremities with other complications 03/09/2012   Hx of herpes simplex infection 12/18/2011   Low back pain 06/28/2010   Lower abdominal pain 06/28/2010   Generalized anxiety disorder 10/13/2008   Irritable bowel syndrome 10/13/2008   Lung nodule 11/25/2007   Raynaud's syndrome 07/01/2007   Aneurysm of thoracic aorta (HCC) 05/06/2007   Allergic rhinitis 05/06/2007   Esophageal reflux 05/06/2007   DEGENERATIVE DISC DISEASE, CERVICAL SPINE 05/06/2007   Osteoporosis 03/18/2007   History of ovarian cancer 09/22/1977   Current Meds  Medication Sig   Ascorbic Acid (VITAMIN C) 1000 MG tablet Take 1,000 mg by mouth daily.   aspirin EC (ASPIRIN LOW DOSE) 81 MG tablet TAKE 1 TABLET DAILY (SWALLOW WHOLE)   Cholecalciferol 25 MCG (1000 UT) capsule Take by mouth.   COLLAGEN PO Take by mouth.   dicyclomine (BENTYL) 10 MG capsule Take 1 capsule (10 mg  total) by mouth 4 (four) times daily -  before meals and at bedtime.   esomeprazole (NEXIUM) 20 MG capsule Take 1 capsule (20 mg total) by mouth daily.   gabapentin (NEURONTIN) 100 MG capsule Take 100 mg by mouth 2 (two) times daily.   MAGNESIUM PO Take 300 mg by mouth.   nitroGLYCERIN (NITROSTAT) 0.4 MG SL tablet Place 1 tablet (0.4 mg total) under the tongue every 5 (five) minutes as needed for chest pain.   rosuvastatin (CRESTOR) 5 MG tablet  Take 1 tablet (5 mg total) by mouth daily. Take 1 tablet daily by mouth.   TART CHERRY PO Take 1 Capful by mouth daily.   valACYclovir (VALTREX) 500 MG tablet Take 1 tablet (500 mg total) by mouth 2 (two) times a week.    Allergies: Patient is allergic to prednisone and chlorhexidine gluconate. Family History: Patient family history includes Breast cancer (age of onset: 24) in her cousin; Colon polyps in her brother; Esophageal cancer (age of onset: 32) in her cousin; Healthy in her brother; Hip fracture in her mother; Other in her father. Social History:  Patient  reports that she has never smoked. She has never used smokeless tobacco. She reports that she does not drink alcohol and does not use drugs.  Review of Systems: Constitutional: Negative for fever malaise or anorexia Cardiovascular: negative for chest pain Respiratory: negative for SOB or persistent cough Gastrointestinal: negative for abdominal pain  Objective  Vitals: BP 110/60   Pulse 78   Temp 98.3 F (36.8 C)   Ht 5\' 5"  (1.651 m)   Wt 138 lb 3.2 oz (62.7 kg)   LMP  (LMP Unknown)   SpO2 98%   BMI 23.00 kg/m  General: no acute distress , A&Ox3 HEENT: PEERL, conjunctiva normal, neck is supple, TMs normal bilaterally, nasal mucosa with mild inflammation, oropharynx clear without erythema, no lymphadenopathy, supple neck Cardiovascular:  RRR without murmur or gallop.  Respiratory:  Good breath sounds bilaterally, CTAB with normal respiratory effort Skin:  Warm, no rashes  Commons side effects, risks, benefits, and alternatives for medications and treatment plan prescribed today were discussed, and the patient expressed understanding of the given instructions. Patient is instructed to call or message via MyChart if he/she has any questions or concerns regarding our treatment plan. No barriers to understanding were identified. We discussed Red Flag symptoms and signs in detail. Patient expressed understanding regarding what  to do in case of urgent or emergency type symptoms.  Medication list was reconciled, printed and provided to the patient in AVS. Patient instructions and summary information was reviewed with the patient as documented in the AVS. This note was prepared with assistance of Dragon voice recognition software. Occasional wrong-word or sound-a-like substitutions may have occurred due to the inherent limitations of voice recognition software

## 2023-05-11 ENCOUNTER — Ambulatory Visit (INDEPENDENT_AMBULATORY_CARE_PROVIDER_SITE_OTHER): Payer: Medicare Other | Admitting: Family Medicine

## 2023-05-11 ENCOUNTER — Encounter: Payer: Self-pay | Admitting: Family Medicine

## 2023-05-11 VITALS — BP 112/68 | HR 92 | Temp 98.3°F | Ht 65.0 in | Wt 139.0 lb

## 2023-05-11 DIAGNOSIS — J3489 Other specified disorders of nose and nasal sinuses: Secondary | ICD-10-CM | POA: Diagnosis not present

## 2023-05-11 DIAGNOSIS — R5383 Other fatigue: Secondary | ICD-10-CM

## 2023-05-11 DIAGNOSIS — R3129 Other microscopic hematuria: Secondary | ICD-10-CM

## 2023-05-11 LAB — POC URINALSYSI DIPSTICK (AUTOMATED)
Bilirubin, UA: NEGATIVE
Blood, UA: NEGATIVE
Glucose, UA: NEGATIVE
Ketones, UA: NEGATIVE
Leukocytes, UA: NEGATIVE
Nitrite, UA: NEGATIVE
Protein, UA: NEGATIVE
Spec Grav, UA: 1.015 (ref 1.010–1.025)
Urobilinogen, UA: 0.2 E.U./dL
pH, UA: 6.5 (ref 5.0–8.0)

## 2023-05-11 MED ORDER — CEFDINIR 300 MG PO CAPS: 300.0000 mg | ORAL_CAPSULE | Freq: Two times a day (BID) | ORAL | 0 refills | Status: DC

## 2023-05-11 NOTE — Patient Instructions (Signed)

## 2023-05-11 NOTE — Progress Notes (Signed)
Subjective:     Patient ID: Cindy Schwartz, female    DOB: 27-Nov-1933, 89 y.o.   MRN: [id number]  Chief Complaint  Patient presents with   Discuss fatigue    HPI  Fatigue - She complains of fatigue, blisters in left side of mouth(last wk), post nasal drip, and headaches starting Wednesday 8/14. She went to the dentist, who confirmed she only had blisters on the left side of her mouth. She states she doesn't feel like herself due to her fatigue. Headaches and post nasal drip worsen when lying down. Has an occasion dry cough and is often clearing her throat, which she attributes to her post nasal drip. She took a Covid test on Saturday 8/17, came out negative. She notes she is prone to sinus infections. Was previously prescribed amoxicillin with last sinus infection, did not finish her antibiotics. She reports a previous insect bite on left cubital fossa sev months ago which turned whole are red she is unsure if it could be related. No fevers, burning sensation with urination, or lower abd pain. But has had some microscopic hematuria.   Requesting UA and lyme's test  Health Maintenance Due  Topic Date Due   Zoster Vaccines- Shingrix (1 of 2) 12/16/1954   Pneumonia Vaccine 78+ Years old (2 of 2 - PCV) 11/05/2019   MAMMOGRAM  02/11/2023    Past Medical History:  Diagnosis Date   Aneurysm of ascending aorta (HCC)    a. 4.2-4.3cm in 2014.   Anxiety    Arthritis    Breast cancer (HCC) 2018   right mastectomy   Cancer (HCC) 1980   ovarian cancer   Compression fracture of L1 lumbar vertebra (HCC)    COMPRESSION FRACTURE, THORACIC VERTEBRA 07/24/2010   Qualifier: Diagnosis of  By: Lovell Sheehan MD, [name]    Elevated BP Transient 09/2014   Genetic testing 07/09/2017   Multi-Cancer panel (83 genes) @ [facility] - No pathogenic mutations detected   GERD (gastroesophageal reflux disease)    H/O bone density study    2015   H/O colonoscopy    History of ovarian cancer    November 1979   History of  ovarian cancer 1979   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   Insomnia    Irregular heart beat    Normal coronary arteries Cath - 09/2014   Osteoporosis    Raynaud's disease     Past Surgical History:  Procedure Laterality Date   ABDOMINAL HYSTERECTOMY  1979   APPENDECTOMY     BILATERAL SALPINGOOPHORECTOMY     BREAST LUMPECTOMY     COLONOSCOPY  02/2012   LEFT HEART CATHETERIZATION WITH CORONARY ANGIOGRAM N/A 10/17/2014   Procedure: LEFT HEART CATHETERIZATION WITH CORONARY ANGIOGRAM;  Surgeon: Lennette Bihari, MD;  Location: [facility] CATH LAB;  Service: Cardiovascular;  Laterality: N/A;   ovarian ca s/p taj/bso  1979   SIMPLE MASTECTOMY WITH AXILLARY SENTINEL NODE BIOPSY Right 05/05/2017   Procedure: RIGHT TOTAL MASTECTOMY WITH RIGHT  AXILLARY SENTINEL NODE BIOPSY;  Surgeon: Emelia Loron, MD;  Location: [facility] OR;  Service: General;  Laterality: Right;  PECTORAL BLOCK     Current Outpatient Medications:    Ascorbic Acid (VITAMIN C) 1000 MG tablet, Take 1,000 mg by mouth daily., Disp: , Rfl:    aspirin EC (ASPIRIN LOW DOSE) 81 MG tablet, TAKE 1 TABLET DAILY (SWALLOW WHOLE), Disp: 90 tablet, Rfl: 3   cefdinir (OMNICEF) 300 MG capsule, Take 1 capsule (300 mg total) by mouth 2 (two)  times daily., Disp: 14 capsule, Rfl: 0   Cholecalciferol 25 MCG (1000 UT) capsule, Take by mouth., Disp: , Rfl:    COLLAGEN PO, Take by mouth., Disp: , Rfl:    dicyclomine (BENTYL) 10 MG capsule, Take 1 capsule (10 mg total) by mouth 4 (four) times daily -  before meals and at bedtime., Disp: 120 capsule, Rfl: 11   esomeprazole (NEXIUM) 20 MG capsule, Take 1 capsule (20 mg total) by mouth daily., Disp: 90 capsule, Rfl: 3   fluticasone (FLONASE) 50 MCG/ACT nasal spray, Place 1 spray into both nostrils daily., Disp: 16 g, Rfl: 0   gabapentin (NEURONTIN) 100 MG capsule, Take 100 mg by mouth 2 (two) times daily., Disp: , Rfl:    MAGNESIUM PO, Take 300 mg by mouth., Disp: , Rfl:    nitroGLYCERIN  (NITROSTAT) 0.4 MG SL tablet, Place 1 tablet (0.4 mg total) under the tongue every 5 (five) minutes as needed for chest pain., Disp: 25 tablet, Rfl: 3   rosuvastatin (CRESTOR) 5 MG tablet, Take 1 tablet (5 mg total) by mouth daily. Take 1 tablet daily by mouth., Disp: 90 tablet, Rfl: 3   TART CHERRY PO, Take 1 Capful by mouth daily., Disp: , Rfl:    valACYclovir (VALTREX) 500 MG tablet, Take 1 tablet (500 mg total) by mouth 2 (two) times a week., Disp: 26 tablet, Rfl: 3  Allergies  Allergen Reactions   Prednisone     Fatigue and depressed mood on 7 day 40mg  x 3 days, 20 mg x 4 days from Dr. Durene Cal- intolerance   Chlorhexidine Gluconate Rash   ROS neg/noncontributory except as noted HPI/below     Objective:     BP 112/68 (BP Location: Left Arm, Patient Position: Sitting)   Pulse 92   Temp 98.3 F (36.8 C) (Temporal)   Ht 5\' 5"  (1.651 m)   Wt 139 lb (63 kg)   LMP  (LMP Unknown)   SpO2 97%   BMI 23.13 kg/m  Wt Readings from Last 3 Encounters:  05/11/23 139 lb (63 kg)  04/08/23 138 lb 3.2 oz (62.7 kg)  03/19/23 136 lb 12.8 oz (62.1 kg)    Physical Exam   Gen: WDWN NAD HEENT: NCAT, conjunctiva not injected, sclera nonicteric. TM WNL B, OP moist, no exudates. +Black coating on tongue.  NECK:  supple, no thyromegaly, no nodes, no carotid bruits CARDIAC: RRR, S1S2+, no murmur.  LUNGS: CTAB. No wheezes ABDOMEN:  BS+, soft, NTND, No HSM, no masses. No CVAT EXT:  no edema MSK: no gross abnormalities.  NEURO: A&O x3.  CN II-XII intact.  PSYCH: normal mood. Good eye contact  Results for orders placed or performed in visit on 05/11/23  POCT Urinalysis Dipstick (Automated)  Result Value Ref Range   Color, UA yellow    Clarity, UA clear    Glucose, UA Negative Negative   Bilirubin, UA Negative    Ketones, UA Negative    Spec Grav, UA 1.015 1.010 - 1.025   Blood, UA Negative    pH, UA 6.5 5.0 - 8.0   Protein, UA Negative Negative   Urobilinogen, UA 0.2 0.2 or 1.0 E.U./dL    Nitrite, UA Negative    Leukocytes, UA Negative Negative      Assessment & Plan:  Other fatigue -     Urine Culture -     POCT Urinalysis Dipstick (Automated) -     CBC with Differential/Platelet -     Comprehensive metabolic panel -  TSH -     Lyme Disease Serology w/Reflex  Sinus pressure  Microhematuria -     Urine Culture -     POCT Urinalysis Dipstick (Automated)  Other orders -     Cefdinir; Take 1 capsule (300 mg total) by mouth 2 (two) times daily.  Dispense: 14 capsule; Refill: 0   Fatigue-sudden onset.  ?viral, bacterial, other.  Had blisters in L mouth and now L face pain-?HSV(per pt DDS didn't think so), sinusitis, UTI, other.  Doubt Lyme but pt states large redness on L upper arm after something bit her.  Will check labs, Urine and cx, Lyme.  Worse, no change, f/u Sinus pressure-omnicef 300mg  bid Microhematuria-repeat ua and cx.   Return if symptoms worsen or fail to improve.  I,Rachel Rivera,acting as a scribe for Angelena Sole, MD.,have documented all relevant documentation on the behalf of Angelena Sole, MD,as directed by  Angelena Sole, MD while in the presence of Angelena Sole, MD.  I, Angelena Sole, MD, have reviewed all documentation for this visit. The documentation on 05/11/23 for the exam, diagnosis, procedures, and orders are all accurate and complete.   Angelena Sole, MD

## 2023-05-12 LAB — CBC WITH DIFFERENTIAL/PLATELET
Basophils Absolute: 0.1 10*3/uL (ref 0.0–0.1)
Basophils Relative: 0.8 % (ref 0.0–3.0)
Eosinophils Absolute: 0.1 10*3/uL (ref 0.0–0.7)
Eosinophils Relative: 1.8 % (ref 0.0–5.0)
HCT: 38.8 % (ref 36.0–46.0)
Hemoglobin: 12.6 g/dL (ref 12.0–15.0)
Lymphocytes Relative: 30 % (ref 12.0–46.0)
Lymphs Abs: 2.1 10*3/uL (ref 0.7–4.0)
MCHC: 32.6 g/dL (ref 30.0–36.0)
MCV: 85.6 fl (ref 78.0–100.0)
Monocytes Absolute: 0.6 10*3/uL (ref 0.1–1.0)
Monocytes Relative: 8.3 % (ref 3.0–12.0)
Neutro Abs: 4.1 10*3/uL (ref 1.4–7.7)
Neutrophils Relative %: 59.1 % (ref 43.0–77.0)
Platelets: 247 10*3/uL (ref 150.0–400.0)
RBC: 4.54 Mil/uL (ref 3.87–5.11)
RDW: 14.6 % (ref 11.5–15.5)
WBC: 7 10*3/uL (ref 4.0–10.5)

## 2023-05-12 LAB — COMPREHENSIVE METABOLIC PANEL
ALT: 11 U/L (ref 0–35)
AST: 18 U/L (ref 0–37)
Albumin: 4.1 g/dL (ref 3.5–5.2)
Alkaline Phosphatase: 76 U/L (ref 39–117)
BUN: 18 mg/dL (ref 6–23)
CO2: 29 mEq/L (ref 19–32)
Calcium: 9.9 mg/dL (ref 8.4–10.5)
Chloride: 96 meq/L (ref 96–112)
Creatinine, Ser: 0.73 mg/dL (ref 0.40–1.20)
GFR: 73.95 mL/min (ref >60.00)
Glucose, Bld: 98 mg/dL (ref 70–99)
Potassium: 4.9 meq/L (ref 3.5–5.1)
Sodium: 130 meq/L — ABNORMAL LOW (ref 135–145)
Total Bilirubin: 0.4 mg/dL (ref 0.2–1.2)
Total Protein: 7 g/dL (ref 6.0–8.3)

## 2023-05-12 LAB — TSH: TSH: 1.19 u[IU]/mL (ref 0.35–5.50)

## 2023-05-12 LAB — URINE CULTURE
MICRO NUMBER:: 15349311
Result:: NO GROWTH
SPECIMEN QUALITY:: ADEQUATE

## 2023-05-12 LAB — LYME DISEASE SEROLOGY W/REFLEX: Lyme Total Antibody EIA: NEGATIVE

## 2023-05-12 NOTE — Progress Notes (Signed)
Labs look great except sodium chronically a litle low

## 2023-05-19 ENCOUNTER — Ambulatory Visit (INDEPENDENT_AMBULATORY_CARE_PROVIDER_SITE_OTHER): Payer: Medicare Other | Admitting: Podiatry

## 2023-05-19 ENCOUNTER — Ambulatory Visit: Payer: Medicare Other | Admitting: Podiatry

## 2023-05-19 DIAGNOSIS — M79675 Pain in left toe(s): Secondary | ICD-10-CM

## 2023-05-19 DIAGNOSIS — M79674 Pain in right toe(s): Secondary | ICD-10-CM

## 2023-05-19 DIAGNOSIS — L84 Corns and callosities: Secondary | ICD-10-CM

## 2023-05-19 DIAGNOSIS — I739 Peripheral vascular disease, unspecified: Secondary | ICD-10-CM

## 2023-05-19 DIAGNOSIS — B351 Tinea unguium: Secondary | ICD-10-CM

## 2023-05-19 NOTE — Progress Notes (Unsigned)
    Subjective:  Patient ID: Cindy Schwartz, female    DOB: 1934/07/10,  MRN: [id number]  Cindy Schwartz presents to clinic today for:  Chief Complaint  Patient presents with   Nail Problem    RFC   Patient notes nails are thick and elongated, causing pain in shoe gear when ambulating.  She also has painful calluses on the ball of both feet she notes that she likes her great toenails cut back a lot  PCP is Shelva Majestic, MD. date last seen was 01/08/2023  Past Medical History:  Diagnosis Date   Aneurysm of ascending aorta (HCC)    a. 4.2-4.3cm in 2014.   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   Insomnia    Irregular heart beat    Normal coronary arteries Cath - 09/2014   Osteoporosis    Raynaud's disease     Allergies  Allergen Reactions   Prednisone     Fatigue and depressed mood on 7 day 40mg  x 3 days, 20 mg x 4 days from Dr. Durene Cal- intolerance   Chlorhexidine Gluconate Rash   Review of Systems: Negative except as noted in the HPI.  Objective:  There were no vitals filed for this visit.  Cindy Schwartz is a pleasant 89 y.o. female in NAD. AAO x 3.  Vascular Examination: Patient has palpable DP pulse, absent PT pulse bilateral.  Delayed capillary refill bilateral toes.  Sparse digital hair bilateral.  Proximal to distal cooling WNL bilateral.    Dermatological  Examination: Interspaces are clear with no open lesions noted bilateral.  Skin is shiny and atrophic bilateral.  Nails are 3-56mm thick, with yellowish/brown discoloration, subungual debris and distal onycholysis x10.  There is pain with compression of nails x10.  There are hyperkeratotic lesions noted submet 2 bilateral.  No ulceration seen  Patient qualifies for at-risk foot care because of PVD.  Assessment/Plan: 1. Pain due to onychomycosis of toenails of both feet   2. Callus of foot   3. PVD (peripheral vascular disease) (HCC)    Mycotic nails x10 were sharply debrided with sterile nail nippers and power debriding burr to decrease bulk and length.  Hyperkeratotic lesions x 2 were shaved with #312 blade.  Return in about 3 months (around 08/19/2023) for RFC.   Clerance Lav, DPM, FACFAS [facility]     [address] [place], [place] [address]                Office [phone]  Fax [phone]

## 2023-06-05 ENCOUNTER — Encounter: Payer: Self-pay | Admitting: Family Medicine

## 2023-06-05 ENCOUNTER — Ambulatory Visit (INDEPENDENT_AMBULATORY_CARE_PROVIDER_SITE_OTHER): Payer: Medicare Other | Admitting: Family Medicine

## 2023-06-05 VITALS — BP 126/70 | HR 70 | Temp 97.5°F | Ht 65.0 in | Wt 136.6 lb

## 2023-06-05 DIAGNOSIS — M48062 Spinal stenosis, lumbar region with neurogenic claudication: Secondary | ICD-10-CM

## 2023-06-05 DIAGNOSIS — E785 Hyperlipidemia, unspecified: Secondary | ICD-10-CM | POA: Diagnosis not present

## 2023-06-05 DIAGNOSIS — E871 Hypo-osmolality and hyponatremia: Secondary | ICD-10-CM | POA: Diagnosis not present

## 2023-06-05 DIAGNOSIS — R413 Other amnesia: Secondary | ICD-10-CM | POA: Diagnosis not present

## 2023-06-05 LAB — COMPREHENSIVE METABOLIC PANEL
ALT: 14 U/L (ref 0–35)
AST: 21 U/L (ref 0–37)
Albumin: 4 g/dL (ref 3.5–5.2)
Alkaline Phosphatase: 80 U/L (ref 39–117)
BUN: 16 mg/dL (ref 6–23)
CO2: 29 meq/L (ref 19–32)
Calcium: 9.6 mg/dL (ref 8.4–10.5)
Chloride: 98 meq/L (ref 96–112)
Creatinine, Ser: 0.65 mg/dL (ref 0.40–1.20)
GFR: 79.14 mL/min (ref >60.00)
Glucose, Bld: 81 mg/dL (ref 70–99)
Potassium: 4.2 meq/L (ref 3.5–5.1)
Sodium: 134 meq/L — ABNORMAL LOW (ref 135–145)
Total Bilirubin: 0.6 mg/dL (ref 0.2–1.2)
Total Protein: 7.1 g/dL (ref 6.0–8.3)

## 2023-06-05 LAB — LIPID PANEL
Cholesterol: 116 mg/dL (ref 0–200)
HDL: 61.1 mg/dL (ref >39.00)
LDL Cholesterol: 47 mg/dL (ref 0–99)
NonHDL: 55.03
Total CHOL/HDL Ratio: 2
Triglycerides: 38 mg/dL (ref 0.0–149.0)
VLDL: 7.6 mg/dL (ref 0.0–40.0)

## 2023-06-05 LAB — VITAMIN B12: Vitamin B-12: 422 pg/mL (ref 211–911)

## 2023-06-05 NOTE — Progress Notes (Signed)
Phone [phone] In person visit   Subjective:   Cindy Schwartz is a 89 y.o. year old very pleasant female patient who presents for/with See problem oriented charting Chief Complaint  Patient presents with   Medical Management of Chronic Issues   Hyperlipidemia   Joint Pain    Pt c/o joint pain in hands and back that's been there for a while that's getting worse. Got bitten by something a while ago and wonders if this could be the cause.   Fatigue    Pt c/o continued fatigue and tired with head fog and not feeling herself.    Past Medical History-  Patient Active Problem List   Diagnosis Date Noted   Lumbar stenosis 01/08/2023    Priority: High   History of cerebellar stroke 10/05/2020    Priority: High   Memory loss 10/05/2020    Priority: High   history of Breast cancer of lower-outer quadrant of right female breast  01/16/2016    Priority: High   Lower abdominal pain 06/28/2010    Priority: High   Aneurysm of thoracic aorta (HCC) 05/06/2007    Priority: High   History of ovarian cancer 09/22/1977    Priority: High   COPD (chronic obstructive pulmonary disease) (HCC) 03/17/2022    Priority: Medium    History of adenomatous polyp of colon 12/30/2019    Priority: Medium    Mild hyperlipidemia 06/01/2019    Priority: Medium    Chronic cough 01/21/2018    Priority: Medium    Normal coronary arteries 10/17/2014    Priority: Medium    Hx of herpes simplex infection 12/18/2011    Priority: Medium    Esophageal reflux 05/06/2007    Priority: Medium    Osteoporosis 03/18/2007    Priority: Medium    Pain due to onychomycosis of toenails of both feet 12/25/2020    Priority: Low   Chronic idiopathic constipation 03/01/2019    Priority: Low   Genetic testing 07/09/2017    Priority: Low   Palpitations 11/13/2014    Priority: Low   Varicose veins of lower extremities with other complications 03/09/2012    Priority: Low   Generalized anxiety disorder 10/13/2008     Priority: Low   Irritable bowel syndrome 10/13/2008    Priority: Low   Lung nodule 11/25/2007    Priority: Low   Raynaud's syndrome 07/01/2007    Priority: Low   Allergic rhinitis 05/06/2007    Priority: Low   Osteoarthritis of carpometacarpal (CMC) joints of both thumbs 09/26/2021    Priority: 1.   Osteoarthritis of both hands 08/29/2020    Priority: 1.   Bilateral carpal tunnel syndrome 08/29/2020    Priority: 1.   Trigger thumb of left hand 08/29/2020    Priority: 1.   Degenerative arthritis of knee, bilateral 04/08/2019    Priority: 1.   Low back pain 06/28/2010    Priority: 1.   DEGENERATIVE DISC DISEASE, CERVICAL SPINE 05/06/2007    Priority: 1.   Bloating 07/31/2022   Left sided abdominal pain 07/31/2022   Belching 07/31/2022    Medications- reviewed and updated Current Outpatient Medications  Medication Sig Dispense Refill   Ascorbic Acid (VITAMIN C) 1000 MG tablet Take 1,000 mg by mouth daily.     aspirin EC (ASPIRIN LOW DOSE) 81 MG tablet TAKE 1 TABLET DAILY (SWALLOW WHOLE) 90 tablet 3   cefdinir (OMNICEF) 300 MG capsule Take 1 capsule (300 mg total) by mouth 2 (two) times daily. 14 capsule 0  Cholecalciferol 25 MCG (1000 UT) capsule Take by mouth.     COLLAGEN PO Take by mouth.     dicyclomine (BENTYL) 10 MG capsule Take 1 capsule (10 mg total) by mouth 4 (four) times daily -  before meals and at bedtime. 120 capsule 11   esomeprazole (NEXIUM) 20 MG capsule Take 1 capsule (20 mg total) by mouth daily. 90 capsule 3   fluticasone (FLONASE) 50 MCG/ACT nasal spray Place 1 spray into both nostrils daily. 16 g 0   gabapentin (NEURONTIN) 100 MG capsule Take 100 mg by mouth 2 (two) times daily.     MAGNESIUM PO Take 300 mg by mouth.     nitroGLYCERIN (NITROSTAT) 0.4 MG SL tablet Place 1 tablet (0.4 mg total) under the tongue every 5 (five) minutes as needed for chest pain. 25 tablet 3   rosuvastatin (CRESTOR) 5 MG tablet Take 1 tablet (5 mg total) by mouth daily. Take 1  tablet daily by mouth. 90 tablet 3   TART CHERRY PO Take 1 Capful by mouth daily.     valACYclovir (VALTREX) 500 MG tablet Take 1 tablet (500 mg total) by mouth 2 (two) times a week. 26 tablet 3   No current facility-administered medications for this visit.     Objective:  BP 126/70   Pulse 70   Temp (!) 97.5 F (36.4 C)   Ht 5\' 5"  (1.651 m)   Wt 136 lb 9.6 oz (62 kg)   LMP  (LMP Unknown)   SpO2 96%   BMI 22.73 kg/m  Gen: NAD, resting comfortably Maxillary sinus tenderness on left, otherwise sinuses normal, tympanic membrane normal, oropharynx normal, nasal turbinates normal CV: RRR no murmurs rubs or gallops Lungs: CTAB no crackles, wheeze, rhonchi Ext: no edema Skin: warm, dry     Assessment and Plan   # Fatigue and memory changes S: Patient reports fatigue for about 3 months.  Also tends to have head fog with this.  Just does not quite feel herself  -Did have sinusitis treated with amoxicillin (reported to Dr. Ruthine Dose not finishing course) in mid July and had symptoms for 3 weeks before she presented.  She tested for COVID at home and negative - She was seen in mid August for fatigue by Dr. Casandra Doffing was low at 130 down from 133 previously, CBC was normal, Lyme testing was negative, urinalysis normal, TSH normal and no evidence of UTI on urine culture -Reportedly this came on rather suddenly check some blisters in her left mouth and some left facial pain.  Question of HSV but apparently dentist did not think likely.  Possibility of sinusitis or UTI mention as well.  Line was done due to some redness on her left upper arm though no obvious bites.  She was treated with Omnicef twice daily for concern of bacterial sinusitis again.  She did take a COVID test on 05/09/2023 which was negative.  She reports even between visits never really felt like herself. Head feels foggy. Eyes are heavy. Generalized head fullness. Has had long term sinus congestion- herbal from health food stores  is helpful but still feels congested. She reports today didn't even take the first antibiotic due to pill size. Felt slightly better with omnicef after 7 days but worsened again after stopping.   Has had some history of memory issues after prior stroke findings on MRI that was noted in 2021. Feels memory issues have worsened some though. Balance no worse than baseline. No fever or chills. Has  had some periods where felt like would get ill but home remedies helpful. Most pressure is in frontal and maxillary sinuses but also feels diffusely through her head. Feels memory not as clear. Feels like just wants to sleep. Sleep is not bad at night though.    A/P: 89 year old female with 3 months of fatigue and memory changes and sinus fullness as well as generalized head pressure.  Some mild memory changes at baseline after prior stroke noted on imaging in 2021. -With memory changes, sinus fullness, generalized head pressure we will get MRI of the brain.  Also with history of breast cancer so want to be cautious.we are holding on Sinus CT until that done-she would prefer CT of sinuses to confirm congestion before taking more antibiotics-she in general is very cautious about taking medicine.  Sinus exam is not overly impressive-some maxillary sinus tenderness on the left only - doesn't tolerate prednisone but we considered for sinuses/allergies -Memory labs- hold off on TSH, CBC as recently checked. Not at risk for syphils or HIV so we opted out of that.  MRI as above -Does have some hyponatremia on labs but has been chronic issue- slightly worse last time and will do further labs for that-she understands she may have to come back for further labs.  Workup in the past in September 2023 largely reassuring including cortisol level -We did not do formal memory testing but may need to do that at next visit.  Reassuring neurological exam today  #Back issues- ongoing issues with lumbar stenosis-has seen Dr. Denyse Amass in the  past as well as Dr. Christell Constant of orthopedics. She wants to hold off on epidural injection and see if chiropractic intervention may help-plans to see Dr. Ottis Stain  #History of cerebellar stroke with some balance and memory issues/hyperlipidemia S: Patient complained of issues with memory loss-on MRI of brain 09/17/2020-"Remote right cerebellar infarct."  We started aspirin 81 mg as well as rosuvastatin 5 mg daily with LDL goal less than 70  Memory and balance issues could be associated with prior stroke Lab Results  Component Value Date   CHOL 129 07/09/2022   HDL 65.20 07/09/2022   LDLCALC 57 07/09/2022   LDLDIRECT 64.0 01/08/2023   TRIG 34.0 07/09/2022   CHOLHDL 2 07/09/2022  A/P: LDL is at goal for history of stroke and remains on aspirin.  She would like to trial off rosuvastatin to see if this helps her memory but we wanted to confirm no subsequent strokes before making this change-MRI of the brain as above  Recommended follow up: Return in about 4 months (around 10/05/2023) for physical or sooner if needed.Schedule b4 you leave. Future Appointments  Date Time Provider Department Center  08/04/2023  9:45 AM Freddie Breech, DPM [facility]  10/06/2023  8:00 AM [facility] ANNUAL WELLNESS VISIT 1 [facility]   Lab/Order associations:   ICD-10-CM   1. Memory loss  R41.3 MR BRAIN WO CONTRAST    Vitamin B12    2. Spinal stenosis of lumbar region with neurogenic claudication  M48.062     3. Mild hyperlipidemia  E78.5 Lipid panel    4. Hyponatremia  E87.1 Sodium, urine, random    Osmolality, urine    Osmolality    Comprehensive metabolic panel      No orders of the defined types were placed in this encounter.   Return precautions advised.  Tana Conch, MD

## 2023-06-05 NOTE — Patient Instructions (Addendum)
Please stop by lab before you go If you have mychart- we will send your results within 3 business days of [place] receiving them.  If you do not have mychart- we will call you about results within 5 business days of [place] receiving them.  *please also note that you will see labs on mychart as soon as they post. I will later go in and write notes on them- will say "notes from Dr. Durene Cal"  We will call you within two weeks about your referral for MRI brain through [facility].  Their phone number is [phone].  Please call them if you have not heard in 1-2 weeks - new or worsening symptoms please let me know  Recommended follow up: Return in about 4 months (around 10/05/2023) for physical or sooner if needed.Schedule b4 you leave. BUT if you are not feeling better/we don't get to the root of this- please see me back sooner

## 2023-06-06 LAB — OSMOLALITY: Osmolality: 278 mosm/kg (ref 278–305)

## 2023-06-06 LAB — OSMOLALITY, URINE: Osmolality, Ur: 261 mosm/kg (ref 50–1200)

## 2023-06-06 LAB — SODIUM, URINE, RANDOM: Sodium, Ur: 36 mmol/L (ref 28–272)

## 2023-06-09 ENCOUNTER — Telehealth: Payer: Self-pay | Admitting: Family Medicine

## 2023-06-09 DIAGNOSIS — R6889 Other general symptoms and signs: Secondary | ICD-10-CM

## 2023-06-09 NOTE — Telephone Encounter (Signed)
Please give her info on how to get x-ray done  Please go to [place]  [facility] (updated 11/17/2019) - located [address] across the street from [place] - in the basement - Hours: 8:30-5:00 PM M-F (with lunch from 12:30- 1 PM). You do NOT need an appointment.

## 2023-06-09 NOTE — Telephone Encounter (Signed)
Patient states she was ordered an MRI BRAIN by pcp on 9/13 following OV. States after that visit, she had an appointment with Dr. Pincus Badder, chiropractor. Upon telling Dr. Ottis Stain about imaging, patient states Dr. Ottis Stain requested PCP add neck to the MRI order. Please Advise.

## 2023-06-09 NOTE — Telephone Encounter (Signed)
Spoke with DrOttis Stain. Likely hard to get MR cervical to start. He's ok with cervical spine films which I ordered- we may end up needing MRI down road though- we will see what we find on this initial exam

## 2023-06-09 NOTE — Telephone Encounter (Signed)
See below

## 2023-06-10 NOTE — Telephone Encounter (Signed)
Patient returned call. Requests to be called.

## 2023-06-10 NOTE — Telephone Encounter (Signed)
Called and lm for pt tcb.

## 2023-06-10 NOTE — Telephone Encounter (Signed)
Returned all and line rang busy, will try again tomorrow.

## 2023-06-11 NOTE — Telephone Encounter (Signed)
Patient returned call. Requests to be called.

## 2023-06-12 NOTE — Telephone Encounter (Signed)
Called and lm on pt vm tcb.

## 2023-06-28 ENCOUNTER — Ambulatory Visit
Admission: RE | Admit: 2023-06-28 | Discharge: 2023-06-28 | Disposition: A | Discharge status: Home / Self Care | Payer: Medicare Other | Source: Ambulatory Visit | Attending: Family Medicine | Admitting: Family Medicine

## 2023-06-28 DIAGNOSIS — R413 Other amnesia: Secondary | ICD-10-CM

## 2023-06-29 ENCOUNTER — Telehealth: Payer: Self-pay | Admitting: Family Medicine

## 2023-06-29 NOTE — Telephone Encounter (Signed)
Prescription Request  06/29/2023  LOV: 06/05/2023  What is the name of the medication or equipment? ROSUVASTATIN (CRESTOR) 5 MG TABLET   ASPIRIN EC (ASPIRIN LOW DOSE) 81 MG TABLET   VALACYCLOVIR (VALTREX) 500 MG TABLET   ESOMEPRAZOLE (NEXIUM) 20 MG CAPSULE   Have you contacted your pharmacy to request a refill? Yes   Which pharmacy would you like this sent to?   [facility] - [place], [place] - [address] [place] [address] Phone: [phone] Fax: [phone]    Patient notified that their request is being sent to the clinical staff for review and that they should receive a response within 2 business days.   Please advise at Mobile [phone] (mobile)

## 2023-06-30 MED ORDER — ESOMEPRAZOLE MAGNESIUM 20 MG PO CPDR: 20.0000 mg | DELAYED_RELEASE_CAPSULE | Freq: Every day | ORAL | 3 refills | Status: DC

## 2023-06-30 MED ORDER — VALACYCLOVIR HCL 500 MG PO TABS: 500.0000 mg | ORAL_TABLET | ORAL | 3 refills | Status: AC

## 2023-06-30 MED ORDER — ASPIRIN 81 MG PO TBEC: DELAYED_RELEASE_TABLET | ORAL | 3 refills | Status: AC

## 2023-06-30 MED ORDER — ROSUVASTATIN CALCIUM 5 MG PO TABS: 5.0000 mg | ORAL_TABLET | Freq: Every day | ORAL | 3 refills | Status: DC

## 2023-06-30 NOTE — Telephone Encounter (Signed)
Rx sent to requested pharmacy

## 2023-07-21 ENCOUNTER — Telehealth: Payer: Self-pay | Admitting: Family Medicine

## 2023-07-21 NOTE — Telephone Encounter (Signed)
Pt would like a call back with MRI results.

## 2023-07-22 NOTE — Telephone Encounter (Signed)
Called and lm on pt vm making her aware that results are in however Dr. Durene Cal is out of office until 11/01 and he will review and comment on them when he returns to office.

## 2023-07-29 ENCOUNTER — Ambulatory Visit: Payer: Medicare Other | Admitting: Cardiovascular Disease

## 2023-07-30 ENCOUNTER — Telehealth: Payer: Self-pay | Admitting: Family Medicine

## 2023-07-30 NOTE — Telephone Encounter (Signed)
Called and spoke with pt and results reviewed. 

## 2023-07-30 NOTE — Telephone Encounter (Signed)
Patient called back stating she has been waiting for results since Friday. I informed patient of PCP just coming back on Friday and needing some time to make it through some things to make sure she is getting the upmost care. I then informed pt that she was called by Ardine Bjork on 11/6 and left a VM. Pt stated she never received this. I informed patient I will pass on message to [facility]. Pt verbalized understanding and will be waiting for call back.

## 2023-08-04 ENCOUNTER — Encounter: Payer: Self-pay | Admitting: Podiatry

## 2023-08-04 ENCOUNTER — Ambulatory Visit (INDEPENDENT_AMBULATORY_CARE_PROVIDER_SITE_OTHER): Payer: Medicare Other | Admitting: Podiatry

## 2023-08-04 DIAGNOSIS — B351 Tinea unguium: Secondary | ICD-10-CM

## 2023-08-04 DIAGNOSIS — Q828 Other specified congenital malformations of skin: Secondary | ICD-10-CM | POA: Diagnosis not present

## 2023-08-04 DIAGNOSIS — M79672 Pain in left foot: Secondary | ICD-10-CM

## 2023-08-04 DIAGNOSIS — M79674 Pain in right toe(s): Secondary | ICD-10-CM | POA: Diagnosis not present

## 2023-08-04 DIAGNOSIS — M79675 Pain in left toe(s): Secondary | ICD-10-CM | POA: Diagnosis not present

## 2023-08-04 DIAGNOSIS — M79671 Pain in right foot: Secondary | ICD-10-CM

## 2023-08-06 ENCOUNTER — Telehealth: Payer: Self-pay | Admitting: Family Medicine

## 2023-08-06 DIAGNOSIS — M818 Other osteoporosis without current pathological fracture: Secondary | ICD-10-CM

## 2023-08-06 NOTE — Telephone Encounter (Signed)
You can order another one under osteoporosis and give her the following information plus connect her with the front desk to schedule  Schedule your bone density test at check out desk.  - located [address] across the street from [place] - in the basement - you DO NEED an appointment for the bone density tests.

## 2023-08-06 NOTE — Telephone Encounter (Signed)
Last bone density 07/2021.

## 2023-08-06 NOTE — Telephone Encounter (Signed)
Pt states he think she is due for a Bone Density scan. She would like a call back if yes or no. Please advise.

## 2023-08-07 NOTE — Progress Notes (Signed)
  Subjective:  Patient ID: Cindy Schwartz, female    DOB: [birth year],  MRN: [id number]  Cindy Schwartz presents to clinic today for painful porokeratotic lesion(s) of both feet and painful mycotic toenails that limit ambulation. Painful toenails interfere with ambulation. Aggravating factors include wearing enclosed shoe gear. Pain is relieved with periodic professional debridement. Painful porokeratotic lesions are aggravated when weightbearing with and without shoegear. Pain is relieved with periodic professional debridement.  Chief Complaint  Patient presents with   Routine Post Op    [facility] PATIENT STATES HER LAST PCP VISIT WAS IN SEPTEMBER    New problem(s): None.   PCP is Shelva Majestic, MD.  Allergies  Allergen Reactions   Prednisone     Fatigue and depressed mood on 7 day 40mg  x 3 days, 20 mg x 4 days from Dr. Durene Cal- intolerance   Chlorhexidine Gluconate Rash    Review of Systems: Negative except as noted in the HPI.  Objective: No changes noted in today's physical examination. There were no vitals filed for this visit. Cindy Schwartz is a pleasant 89 y.o. female WD, WN in NAD. AAO x 3.  Vascular Examination: CFT <3 seconds b/l LE. Palpable DP pulse(s) b/l LE. Palpable PT pulse(s) b/l LE. Pedal hair sparse. No pain with calf compression b/l. No edema noted b/l LE. Varicosities present b/l. No cyanosis or clubbing noted b/l LE.  Dermatological Examination: Pedal skin thin and atrophic b/l LE. No open wounds b/l LE. No interdigital macerations noted b/l LE.   Toenails 1-5 b/l elongated, discolored, dystrophic, thickened, crumbly with subungual debris and tenderness to dorsal palpation.   Porokeratotic lesion(s) submet head 2 b/l and plantarcentral heelpad left foot. No erythema, no edema, no drainage, no fluctuance.  Neurological Examination: Protective sensation intact with 10 gram monofilament b/l LE. Vibratory sensation intact b/l LE.   Musculoskeletal Examination: Muscle  strength 5/5 to all LE muscle groups b/l. Plantarflexed metatarsal(s) 2nd metatarsal head b/l lower extremities.  Assessment/Plan: 1. Pain due to onychomycosis of toenails of both feet   2. Porokeratosis   3. Pain in both feet    -Consent given for treatment as described below: -Examined patient. -Patient to continue soft, supportive shoe gear daily. -Toenails 1-5 b/l were debrided in length and girth with sterile nail nippers and dremel without iatrogenic bleeding.  -Porokeratotic lesion(s) left heel and submet head 2 b/l pared and enucleated with sterile currette without incident. Total number of lesions debrided=3. -Patient/POA to call should there be question/concern in the interim.   Return in about 3 months (around 11/04/2023).  Freddie Breech, DPM

## 2023-08-10 ENCOUNTER — Ambulatory Visit
Admission: RE | Admit: 2023-08-10 | Discharge: 2023-08-10 | Disposition: A | Discharge status: Home / Self Care | Payer: Medicare Other | Source: Ambulatory Visit | Attending: Family Medicine | Admitting: Family Medicine

## 2023-08-10 DIAGNOSIS — M818 Other osteoporosis without current pathological fracture: Secondary | ICD-10-CM

## 2023-08-10 NOTE — Telephone Encounter (Signed)
See below and schedule pt please.

## 2023-08-10 NOTE — Telephone Encounter (Signed)
LVM to schedule patient for DEXA scan.

## 2023-08-27 ENCOUNTER — Telehealth: Payer: Self-pay | Admitting: Family Medicine

## 2023-08-27 NOTE — Telephone Encounter (Signed)
Patient returned call re: Bone Density

## 2023-08-27 NOTE — Telephone Encounter (Signed)
Called and spoke with pt and results reviewed. 

## 2023-08-31 ENCOUNTER — Encounter: Payer: Self-pay | Admitting: Family

## 2023-08-31 ENCOUNTER — Ambulatory Visit: Payer: Medicare Other | Admitting: Family Medicine

## 2023-08-31 ENCOUNTER — Ambulatory Visit (INDEPENDENT_AMBULATORY_CARE_PROVIDER_SITE_OTHER): Payer: Medicare Other | Admitting: Family

## 2023-08-31 VITALS — BP 139/76 | HR 89 | Temp 97.3°F | Ht 65.0 in | Wt 140.0 lb

## 2023-08-31 DIAGNOSIS — R059 Cough, unspecified: Secondary | ICD-10-CM | POA: Diagnosis not present

## 2023-08-31 LAB — POCT INFLUENZA A/B
Influenza A, POC: NEGATIVE
Influenza B, POC: NEGATIVE

## 2023-08-31 LAB — POC COVID19 BINAXNOW: SARS Coronavirus 2 Ag: NEGATIVE

## 2023-08-31 MED ORDER — AZITHROMYCIN 250 MG PO TABS: ORAL_TABLET | ORAL | 0 refills | Status: AC

## 2023-08-31 NOTE — Progress Notes (Signed)
Patient ID: Cindy Schwartz, female    DOB: 06/23/1934, 89 y.o.   MRN: [id number]  Chief Complaint  Patient presents with   Cough    Pt states that she has a cough, since last Wednesday   Nasal Congestion    Pt has tried OTC meds that helped some, Pt has not tested for COVID or flu    Generalized Body Aches   Discussed the use of AI scribe software for clinical note transcription with the patient, who gave verbal consent to proceed.  History of Present Illness   The patient, with no known history of asthma, presents with a week-long history of cough and congestion. She describes the cough as productive with green mucus, and denies any associated runny nose. She has not experienced any fever. She has been self-medicating with over-the-counter cold medicine and a homemade remedy of onion and honey. She expresses concern about potential lung involvement due to her age. She has not received the flu shot this year but has had 2 pneumonia vaccines in the past. She also mentions a history of stomach sensitivity to certain medications.      Assessment & Plan:     Upper Respiratory Infection  - Lungs with mild rhonchi. Symptoms started last Wednesday with cough, congestion, and green sputum. No fever. Negative for flu and COVID. Patient has been using home remedies including honey and onion syrup.   -Prescribing Azithromycin, taking into account patient's previous stomach issues with certain antibiotics.   -Ok to continue OTC sinus/cough meds. -Advised patient to continue drinking plenty of water and to contact the office if symptoms persist after finishing the course of antibiotics.    Influenza Vaccination   Patient has not received the flu shot this year and is considering it.   -Recommend patient to return for the flu shot or she can get at her pharmacy once she is feeling better.    Subjective:    Outpatient Medications Prior to Visit  Medication Sig Dispense Refill   Ascorbic Acid (VITAMIN  C) 1000 MG tablet Take 1,000 mg by mouth daily.     aspirin EC (ASPIRIN LOW DOSE) 81 MG tablet TAKE 1 TABLET DAILY (SWALLOW WHOLE) 90 tablet 3   cefdinir (OMNICEF) 300 MG capsule Take 1 capsule (300 mg total) by mouth 2 (two) times daily. 14 capsule 0   Cholecalciferol 25 MCG (1000 UT) capsule Take by mouth.     COLLAGEN PO Take by mouth.     dicyclomine (BENTYL) 10 MG capsule Take 1 capsule (10 mg total) by mouth 4 (four) times daily -  before meals and at bedtime. 120 capsule 11   esomeprazole (NEXIUM) 20 MG capsule Take 1 capsule (20 mg total) by mouth daily. 90 capsule 3   fluticasone (FLONASE) 50 MCG/ACT nasal spray Place 1 spray into both nostrils daily. 16 g 0   gabapentin (NEURONTIN) 100 MG capsule Take 100 mg by mouth 2 (two) times daily.     MAGNESIUM PO Take 300 mg by mouth.     nitroGLYCERIN (NITROSTAT) 0.4 MG SL tablet Place 1 tablet (0.4 mg total) under the tongue every 5 (five) minutes as needed for chest pain. 25 tablet 3   rosuvastatin (CRESTOR) 5 MG tablet Take 1 tablet (5 mg total) by mouth daily. Take 1 tablet daily by mouth. 90 tablet 3   TART CHERRY PO Take 1 Capful by mouth daily.     valACYclovir (VALTREX) 500 MG tablet Take 1 tablet (500 mg total)  by mouth 2 (two) times a week. 26 tablet 3   No facility-administered medications prior to visit.   Past Medical History:  Diagnosis Date   Aneurysm of ascending aorta (HCC)    a. 4.2-4.3cm in 2014.   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   Insomnia    Irregular heart beat    Normal coronary arteries Cath - 09/2014   Osteoporosis    Raynaud's disease    Past Surgical History:  Procedure Laterality Date   ABDOMINAL HYSTERECTOMY  1979   APPENDECTOMY     BILATERAL SALPINGOOPHORECTOMY     BREAST LUMPECTOMY     COLONOSCOPY  02/2012   LEFT HEART CATHETERIZATION WITH CORONARY ANGIOGRAM N/A 10/17/2014   Procedure: LEFT HEART CATHETERIZATION WITH CORONARY ANGIOGRAM;  Surgeon: Lennette Bihari, MD;  Location: [facility] CATH LAB;  Service: Cardiovascular;  Laterality: N/A;   ovarian ca s/p taj/bso  1979   SIMPLE MASTECTOMY WITH AXILLARY SENTINEL NODE BIOPSY Right 05/05/2017   Procedure: RIGHT TOTAL MASTECTOMY WITH RIGHT  AXILLARY SENTINEL NODE BIOPSY;  Surgeon: Emelia Loron, MD;  Location: [facility] OR;  Service: General;  Laterality: Right;  PECTORAL BLOCK   Allergies  Allergen Reactions   Prednisone     Fatigue and depressed mood on 7 day 40mg  x 3 days, 20 mg x 4 days from Dr. Durene Cal- intolerance   Chlorhexidine Gluconate Rash      Objective:    Physical Exam Vitals and nursing note reviewed.  Constitutional:      Appearance: Normal appearance. She is not ill-appearing.     Interventions: Face mask in place.  HENT:     Right Ear: Tympanic membrane and ear canal normal.     Left Ear: Tympanic membrane and ear canal normal.     Nose:     Right Sinus: No frontal sinus tenderness.     Left Sinus: No frontal sinus tenderness.     Mouth/Throat:     Mouth: Mucous membranes are moist.     Pharynx: Posterior oropharyngeal erythema present. No pharyngeal swelling, oropharyngeal exudate or uvula swelling.     Tonsils: No tonsillar exudate or tonsillar abscesses.  Cardiovascular:     Rate and Rhythm: Normal rate and regular rhythm.  Pulmonary:     Effort: Pulmonary effort is normal.     Breath sounds: Examination of the  right-upper field reveals rhonchi. Examination of the left-upper field reveals rhonchi. Rhonchi present.  Musculoskeletal:        General: Normal range of motion.  Lymphadenopathy:     Head:     Right side of head: No preauricular or posterior auricular adenopathy.     Left side of head: No preauricular or posterior auricular adenopathy.     Cervical: No cervical adenopathy.  Skin:    General:  Skin is warm and dry.  Neurological:     Mental Status: She is alert.  Psychiatric:        Mood and Affect: Mood normal.        Behavior: Behavior normal.    BP 139/76   Pulse 89   Temp (!) 97.3 F (36.3 C)   Ht 5\' 5"  (1.651 m)   Wt 140 lb (63.5 kg)   LMP  (LMP Unknown)   SpO2 99%   BMI 23.30 kg/m  Wt Readings from Last 3 Encounters:  08/31/23 140 lb (63.5 kg)  06/05/23 136 lb 9.6 oz (62 kg)  05/11/23 139 lb (63 kg)       Dulce Sellar, NP

## 2023-09-02 ENCOUNTER — Ambulatory Visit: Payer: Medicare Other | Admitting: Family Medicine

## 2023-10-06 ENCOUNTER — Ambulatory Visit (INDEPENDENT_AMBULATORY_CARE_PROVIDER_SITE_OTHER): Payer: Medicare Other

## 2023-10-06 VITALS — Wt 140.0 lb

## 2023-10-06 DIAGNOSIS — Z0000 Encounter for general adult medical examination without abnormal findings: Secondary | ICD-10-CM

## 2023-10-06 NOTE — Progress Notes (Signed)
 Subjective:   Cindy Schwartz is a 89 y.o. female who presents for Medicare Annual (Subsequent) preventive examination.  Visit Complete: Virtual I connected with  [name] on 10/06/23 by a audio enabled telemedicine application and verified that I am speaking with the correct person using two identifiers.  Patient Location: Home  Provider Location: Office/Clinic  I discussed the limitations of evaluation and management by telemedicine. The patient expressed understanding and agreed to proceed.  Vital Signs: Because this visit was a virtual/telehealth visit, some criteria may be missing or patient reported. Any vitals not documented were not able to be obtained and vitals that have been documented are patient reported.   Cardiac Risk Factors include: advanced age (>72men, >78 women);dyslipidemia     Objective:    Today's Vitals   10/06/23 0802  Weight: 140 lb (63.5 kg)   Body mass index is 23.3 kg/m.     10/06/2023    8:12 AM 12/05/2022   10:21 AM 09/30/2022    8:08 AM 07/07/2022   10:36 AM 03/12/2022   12:21 PM 01/22/2022   10:26 AM 09/24/2021    8:09 AM  Advanced Directives  Does Patient Have a Medical Advance Directive? Yes Yes Yes Yes No Yes Yes  Type of Estate Agent of [place];Living will Living will Healthcare Power of [place];Living will Healthcare Power of [place];Living will  Living will;Healthcare Power of [employer] Power of Attorney  Does patient want to make changes to medical advance directive?      No - Patient declined   Copy of Healthcare Power of Attorney in Chart? No - copy requested  No - copy requested   No - copy requested No - copy requested  Would patient like information on creating a medical advance directive?     No - Patient declined      Current Medications (verified) Outpatient Encounter Medications as of 10/06/2023  Medication Sig   Ascorbic Acid (VITAMIN C) 1000 MG tablet Take 1,000 mg by mouth daily.   aspirin   EC (ASPIRIN  LOW DOSE) 81 MG tablet TAKE 1 TABLET DAILY (SWALLOW WHOLE)   Calcium  Carbonate (CALCIUM  500 PO) Take by mouth.   Cholecalciferol 25 MCG (1000 UT) capsule Take by mouth.   COLLAGEN PO Take by mouth.   esomeprazole  (NEXIUM ) 20 MG capsule Take 1 capsule (20 mg total) by mouth daily.   gabapentin  (NEURONTIN ) 100 MG capsule Take 100 mg by mouth 2 (two) times daily.   MAGNESIUM  PO Take 300 mg by mouth.   rosuvastatin  (CRESTOR ) 5 MG tablet Take 1 tablet (5 mg total) by mouth daily. Take 1 tablet daily by mouth.   TART CHERRY PO Take 1 Capful by mouth daily.   valACYclovir  (VALTREX ) 500 MG tablet Take 1 tablet (500 mg total) by mouth 2 (two) times a week.   nitroGLYCERIN  (NITROSTAT ) 0.4 MG SL tablet Place 1 tablet (0.4 mg total) under the tongue every 5 (five) minutes as needed for chest pain. (Patient not taking: Reported on 10/06/2023)   [DISCONTINUED] cefdinir  (OMNICEF ) 300 MG capsule Take 1 capsule (300 mg total) by mouth 2 (two) times daily.   [DISCONTINUED] dicyclomine  (BENTYL ) 10 MG capsule Take 1 capsule (10 mg total) by mouth 4 (four) times daily -  before meals and at bedtime.   [DISCONTINUED] fluticasone  (FLONASE ) 50 MCG/ACT nasal spray Place 1 spray into both nostrils daily.   No facility-administered encounter medications on file as of 10/06/2023.    Allergies (verified) Prednisone  and Chlorhexidine  gluconate  History: Past Medical History:  Diagnosis Date   Aneurysm of ascending aorta (HCC)    a. 4.2-4.3cm in 2014.   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   Insomnia    Irregular heart beat    Normal coronary arteries Cath - 09/2014   Osteoporosis    Raynaud's disease    Past Surgical History:  Procedure Laterality Date   ABDOMINAL HYSTERECTOMY  1979   APPENDECTOMY     BILATERAL SALPINGOOPHORECTOMY     BREAST LUMPECTOMY     COLONOSCOPY  02/2012   LEFT HEART CATHETERIZATION WITH CORONARY ANGIOGRAM N/A 10/17/2014   Procedure: LEFT HEART CATHETERIZATION WITH CORONARY ANGIOGRAM;  Surgeon: Debby DELENA Sor, MD;  Location: [facility] CATH LAB;  Service: Cardiovascular;  Laterality: N/A;   ovarian ca s/p taj/bso  1979   SIMPLE MASTECTOMY WITH AXILLARY SENTINEL NODE BIOPSY Right 05/05/2017   Procedure: RIGHT TOTAL MASTECTOMY WITH RIGHT  AXILLARY SENTINEL NODE BIOPSY;  Surgeon: Ebbie Cough, MD;  Location: [facility] OR;  Service: General;  Laterality: Right;  PECTORAL BLOCK   Family History  Problem Relation Age of Onset   Hip fracture Mother    Other Father        lived to 69   Healthy Brother    Colon polyps Brother    Breast cancer Cousin 80       mat first cousin related through uncle   Esophageal cancer Cousin 58       mat first cousin related through uncle   Colon cancer Neg Hx    Pancreatic cancer Neg Hx    Stomach cancer Neg Hx    Social History   Socioeconomic History   Marital status: Widowed    Spouse name: Firefighter   Number of children: 2   Years of education: Not on file   Highest education level: Not on file  Occupational History   Occupation: retired    Associate Professor: RETIRED  Tobacco Use   Smoking status: Never   Smokeless tobacco: Never  Vaping Use   Vaping status: Never Used  Substance and Sexual Activity   Alcohol use: No   Drug use: No   Sexual activity: Not on file  Other Topics Concern   Not on file  Social History Narrative   Widowed  aug 2021. She and her husband  from the [place] area of [place]. Children: Cindy Schwartz, lives in [place], [address], lives in [place], [place]. 4 grandkids. No greatgrandkids.       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    Social Drivers of Health   Financial Resource Strain: Low Risk  (10/06/2023)   Overall Financial Resource Strain (CARDIA)    Difficulty  of Paying Living Expenses: Not hard at all  Food Insecurity: No Food Insecurity (10/06/2023)   Hunger Vital Sign    Worried About Running Out of Food in the Last Year: Never true    Ran Out of Food in the Last Year: Never true  Transportation Needs: No Transportation Needs (10/06/2023)   PRAPARE - Administrator, Civil Service (Medical): No    Lack of Transportation (Non-Medical): No  Physical Activity: Insufficiently Active (10/06/2023)   Exercise Vital Sign    Days of Exercise per Week: 4 days    Minutes of Exercise per Session: 20 min  Stress: No Stress Concern Present (10/06/2023)   [employer] of Occupational Health - Occupational Stress Questionnaire    Feeling of Stress : Not at all  Social Connections: Moderately Isolated (10/06/2023)   Social Connection and Isolation Panel [NHANES]    Frequency of Communication with Friends and Family: More than three times a week    Frequency of Social Gatherings with Friends and Family: More than three times a week    Attends Religious Services: More than 4 times per year    Active Member of [employer] or Organizations: No    Attends Banker Meetings: Never    Marital Status: Widowed    Tobacco Counseling Counseling given: Not Answered   Clinical Intake:  Pre-visit preparation completed: Yes  Pain : No/denies pain     BMI - recorded: 23.3 Nutritional Status: BMI of 19-24  Normal Nutritional Risks: None Diabetes: No  How often do you need to have someone help  you when you read instructions, pamphlets, or other written materials from your doctor or pharmacy?: 1 - Never  Interpreter Needed?: No  Information entered by :: Ellouise Haws, LPN   Activities of Daily Living    10/06/2023    8:05 AM  In your present state of health, do you have any difficulty performing the following activities:  Hearing? 1  Comment slight loss  Vision? 0  Difficulty concentrating or making decisions? 0  Walking or climbing stairs? 0  Dressing or bathing? 0  Doing errands, shopping? 0  Preparing Food and eating ? N  Using the Toilet? N  In the past six months, have you accidently leaked urine? N  Do you have problems with loss of bowel control? Y  Comment at times accidents  Managing your Medications? N  Managing your Finances? N  Housekeeping or managing your Housekeeping? N    Patient Care Team: [name], MD as PCP - General (Family Medicine) Delford Maude BROCKS, MD as PCP - Cardiology (Cardiology) Donnald Charleston, MD as Consulting Physician (Gastroenterology) Honora Ellouise, PA-C as Physician Assistant (Physician Assistant) Maurilio Camellia PARAS, MD as Consulting Physician (Allergy  and Immunology) Rosan Credit, MD as Consulting Physician (Ophthalmology)  Indicate any recent Medical Services you may have received from other than [facility] providers in the past year (date may be approximate).     Assessment:   This is a routine wellness examination for Yuritza.  Hearing/Vision screen Hearing Screening - Comments:: Pt stated slight loss Vision Screening - Comments:: Pt follows up with [facility] ophthalmology    Goals Addressed             This Visit's Progress    Patient Stated       Stay focused on exercise        Depression Screen    10/06/2023    8:11 AM 04/08/2023    8:53 AM 01/08/2023  9:03 AM 11/06/2022   11:17 AM 09/30/2022    8:06 AM 05/09/2022    1:14 PM 09/24/2021    8:07 AM  PHQ 2/9 Scores  PHQ - 2 Score 0 0 0 0 0 0 0     Fall Risk    10/06/2023    8:13 AM 04/08/2023    8:53 AM 01/08/2023    9:03 AM 11/06/2022   11:17 AM 09/30/2022    8:09 AM  Fall Risk   Falls in the past year? 0 0 0 0 0  Number falls in past yr: 0 0 0 0 0  Injury with Fall? 0 0 0 0 0  Risk for fall due to : Impaired balance/gait No Fall Risks No Fall Risks No Fall Risks Impaired balance/gait;Impaired vision  Follow up Falls prevention discussed Falls evaluation completed Falls evaluation completed Falls evaluation completed Falls prevention discussed    MEDICARE RISK AT HOME: Medicare Risk at Home Any stairs in or around the home?: Yes If so, are there any without handrails?: No Home free of loose throw rugs in walkways, pet beds, electrical cords, etc?: Yes Adequate lighting in your home to reduce risk of falls?: Yes Life alert?: Yes Use of a cane, walker or w/c?: No Grab bars in the bathroom?: No Shower chair or bench in shower?: No Elevated toilet seat or a handicapped toilet?: No  TIMED UP AND GO:  Was the test performed?  No    Cognitive Function:    08/23/2020    9:51 AM  MMSE - Mini Mental State Exam  Orientation to time 5  Orientation to Place 5  Registration 3  Attention/ Calculation 2  Recall 2  Language- name 2 objects 1  Language- repeat 1  Language- follow 3 step command 3  Language- read & follow direction 1  Write a sentence 1  Copy design 1  Total score 25        10/06/2023    8:13 AM 09/30/2022    8:12 AM 09/24/2021    8:12 AM 09/10/2020    9:03 AM  6CIT Screen  What Year? 0 points 0 points 0 points 0 points  What month? 0 points 0 points 0 points 0 points  What time? 0 points 0 points 0 points   Count back from 20 0 points 0 points 0 points 0 points  Months in reverse 0 points 2 points 0 points 0 points  Repeat phrase 0 points 4 points 4 points 0 points  Total Score 0 points 6 points 4 points     Immunizations Immunization History  Administered Date(s) Administered   Fluad Quad(high Dose  65+) 06/01/2019, 06/11/2020, 06/26/2021, 07/09/2022   Influenza Split 06/26/2011, 07/07/2012   Influenza Whole 07/09/1999, 07/01/2007, 06/27/2008, 07/11/2009, 06/28/2010   Influenza, High Dose Seasonal PF 07/13/2013, 06/26/2015, 07/15/2017, 06/21/2018   Influenza,inj,Quad PF,6+ Mos 06/22/2014   Influenza,inj,quad, With Preservative 06/01/2019   Influenza-Unspecified 08/15/2016   PFIZER(Purple Top)SARS-COV-2 Vaccination 10/14/2019, 11/05/2019   Pneumococcal Polysaccharide-23 12/20/2012, 11/04/2018   Td 09/23/2003   Tdap 06/04/2020   Zoster, Live 01/19/2013    TDAP status: Up to date  Flu Vaccine status: Due, Education has been provided regarding the importance of this vaccine. Advised may receive this vaccine at local pharmacy or Health Dept. Aware to provide a copy of the vaccination record if obtained from local pharmacy or Health Dept. Verbalized acceptance and understanding.  Pneumococcal vaccine status: Up to date  Covid-19 vaccine status: Declined, Education has been provided regarding  the importance of this vaccine but patient still declined. Advised may receive this vaccine at local pharmacy or Health Dept.or vaccine clinic. Aware to provide a copy of the vaccination record if obtained from local pharmacy or Health Dept. Verbalized acceptance and understanding.  Qualifies for Shingles Vaccine? Yes   Zostavax completed No   Shingrix Completed?: No.    Education has been provided regarding the importance of this vaccine. Patient has been advised to call insurance company to determine out of pocket expense if they have not yet received this vaccine. Advised may also receive vaccine at local pharmacy or Health Dept. Verbalized acceptance and understanding.  Screening Tests Health Maintenance  Topic Date Due   Zoster Vaccines- Shingrix (1 of 2) 12/16/1952   INFLUENZA VACCINE  12/21/2023 (Originally 04/23/2023)   Pneumonia Vaccine 69+ Years old (2 of 2 - PCV) 06/04/2024 (Originally  11/05/2019)   MAMMOGRAM  01/14/2024   Medicare Annual Wellness (AWV)  10/05/2024   DTaP/Tdap/Td (3 - Td or Tdap) 06/04/2030   DEXA SCAN  Completed   HPV VACCINES  Aged Out   COVID-19 Vaccine  Discontinued    Health Maintenance  Health Maintenance Due  Topic Date Due   Zoster Vaccines- Shingrix (1 of 2) 12/16/1952    Colorectal cancer screening: No longer required.   Mammogram status: Completed 01/14/23. Repeat every year  Bone Density status: Completed 08/10/23. Results reflect: Bone density results: OSTEOPOROSIS. Repeat every 2 years.  Additional Screening:  Vision Screening: Recommended annual ophthalmology exams for early detection of glaucoma and other disorders of the eye. Is the patient up to date with their annual eye exam?  Yes  Who is the provider or what is the name of the office in which the patient attends annual eye exams? [facility] ophthalmology  If pt is not established with a provider, would they like to be referred to a provider to establish care? No .   Dental Screening: Recommended annual dental exams for proper oral hygiene   Community Resource Referral / Chronic Care Management: CRR required this visit?  No   CCM required this visit?  No     Plan:     I have personally reviewed and noted the following in the patient's chart:   Medical and social history Use of alcohol, tobacco or illicit drugs  Current medications and supplements including opioid prescriptions. Patient is not currently taking opioid prescriptions. Functional ability and status Nutritional status Physical activity Advanced directives List of other physicians Hospitalizations, surgeries, and ER visits in previous 12 months Vitals Screenings to include cognitive, depression, and falls Referrals and appointments  In addition, I have reviewed and discussed with patient certain preventive protocols, quality metrics, and best practice recommendations. A written personalized care  plan for preventive services as well as general preventive health recommendations were provided to patient.     Ellouise VEAR Haws, LPN   [id number]/[id number]   After Visit Summary: (Declined) Due to this being a telephonic visit, with patients personalized plan was offered to patient but patient Declined AVS at this time   Nurse Notes: This patient declined Interactive audio and acupuncturist. Therefore the visit was completed with audio only.

## 2023-10-06 NOTE — Patient Instructions (Addendum)
 Cindy Schwartz , Thank you for taking time to come for your Medicare Wellness Visit. I appreciate your ongoing commitment to your health goals. Please review the following plan we discussed and let me know if I can assist you in the future.   Referrals/Orders/Follow-Ups/Clinician Recommendations: stay focused on exercise   This is a list of the screening recommended for you and due dates:  Health Maintenance  Topic Date Due   Zoster (Shingles) Vaccine (1 of 2) 12/16/1952   Mammogram  02/11/2023   Flu Shot  04/23/2023   Pneumonia Vaccine (2 of 2 - PCV) 06/04/2024*   Medicare Annual Wellness Visit  10/05/2024   DTaP/Tdap/Td vaccine (3 - Td or Tdap) 06/04/2030   DEXA scan (bone density measurement)  Completed   HPV Vaccine  Aged Out   COVID-19 Vaccine  Discontinued  *Topic was postponed. The date shown is not the original due date.    Advanced directives: (Copy Requested) Please bring a copy of your health care power of attorney and living will to the office to be added to your chart at your convenience.  Next Medicare Annual Wellness Visit scheduled for next year: Yes  This patient declined Interactive audio and video telecommunications. Therefore the visit was completed with audio only.

## 2023-10-13 ENCOUNTER — Encounter: Payer: Self-pay | Admitting: Family Medicine

## 2023-10-13 ENCOUNTER — Ambulatory Visit (INDEPENDENT_AMBULATORY_CARE_PROVIDER_SITE_OTHER): Payer: Medicare Other | Admitting: Family Medicine

## 2023-10-13 VITALS — BP 130/72 | HR 60 | Temp 97.0°F | Ht 65.0 in | Wt 139.0 lb

## 2023-10-13 DIAGNOSIS — E785 Hyperlipidemia, unspecified: Secondary | ICD-10-CM

## 2023-10-13 DIAGNOSIS — I712 Thoracic aortic aneurysm, without rupture, unspecified: Secondary | ICD-10-CM

## 2023-10-13 DIAGNOSIS — Z0000 Encounter for general adult medical examination without abnormal findings: Secondary | ICD-10-CM | POA: Diagnosis not present

## 2023-10-13 DIAGNOSIS — M818 Other osteoporosis without current pathological fracture: Secondary | ICD-10-CM | POA: Diagnosis not present

## 2023-10-13 DIAGNOSIS — J449 Chronic obstructive pulmonary disease, unspecified: Secondary | ICD-10-CM | POA: Diagnosis not present

## 2023-10-13 LAB — CBC WITH DIFFERENTIAL/PLATELET
Basophils Absolute: 0 10*3/uL (ref 0.0–0.1)
Basophils Relative: 0.7 % (ref 0.0–3.0)
Eosinophils Absolute: 0.2 10*3/uL (ref 0.0–0.7)
Eosinophils Relative: 3.6 % (ref 0.0–5.0)
HCT: 39.2 % (ref 36.0–46.0)
Hemoglobin: 12.9 g/dL (ref 12.0–15.0)
Lymphocytes Relative: 31.7 % (ref 12.0–46.0)
Lymphs Abs: 1.8 10*3/uL (ref 0.7–4.0)
MCHC: 33 g/dL (ref 30.0–36.0)
MCV: 85.8 fL (ref 78.0–100.0)
Monocytes Absolute: 0.5 10*3/uL (ref 0.1–1.0)
Monocytes Relative: 9.3 % (ref 3.0–12.0)
Neutro Abs: 3.1 10*3/uL (ref 1.4–7.7)
Neutrophils Relative %: 54.7 % (ref 43.0–77.0)
Platelets: 229 10*3/uL (ref 150.0–400.0)
RBC: 4.57 Mil/uL (ref 3.87–5.11)
RDW: 14.9 % (ref 11.5–15.5)
WBC: 5.7 10*3/uL (ref 4.0–10.5)

## 2023-10-13 LAB — LIPID PANEL
Cholesterol: 129 mg/dL (ref 0–200)
HDL: 62 mg/dL (ref >39.00)
LDL Cholesterol: 59 mg/dL (ref 0–99)
NonHDL: 67.26
Total CHOL/HDL Ratio: 2
Triglycerides: 41 mg/dL (ref 0.0–149.0)
VLDL: 8.2 mg/dL (ref 0.0–40.0)

## 2023-10-13 LAB — COMPREHENSIVE METABOLIC PANEL
ALT: 15 U/L (ref 0–35)
AST: 23 U/L (ref 0–37)
Albumin: 4.2 g/dL (ref 3.5–5.2)
Alkaline Phosphatase: 70 U/L (ref 39–117)
BUN: 12 mg/dL (ref 6–23)
CO2: 29 meq/L (ref 19–32)
Calcium: 9.7 mg/dL (ref 8.4–10.5)
Chloride: 101 meq/L (ref 96–112)
Creatinine, Ser: 0.54 mg/dL (ref 0.40–1.20)
GFR: 82.55 mL/min (ref >60.00)
Glucose, Bld: 89 mg/dL (ref 70–99)
Potassium: 4 meq/L (ref 3.5–5.1)
Sodium: 137 meq/L (ref 135–145)
Total Bilirubin: 0.5 mg/dL (ref 0.2–1.2)
Total Protein: 7.1 g/dL (ref 6.0–8.3)

## 2023-10-13 LAB — VITAMIN D 25 HYDROXY (VIT D DEFICIENCY, FRACTURES): VITD: 64.69 ng/mL (ref 30.00–100.00)

## 2023-10-13 NOTE — Patient Instructions (Addendum)
Please stop by lab before you go If you have mychart- we will send your results within 3 business days of [place] receiving them.  If you do not have mychart- we will call you about results within 5 business days of [place] receiving them.  *please also note that you will see labs on mychart as soon as they post. I will later go in and write notes on them- will say "notes from Dr. Durene Cal"   No changes today unless labs lead [place] to make changes  Recommended follow up: Return in about 6 months (around 04/11/2024) for followup or sooner if needed.Schedule b4 you leave.

## 2023-10-13 NOTE — Progress Notes (Signed)
Phone [phone]   Subjective:  Patient presents today for their annual physical. Chief complaint-noted.   See problem oriented charting- ROS- full  review of systems was completed and negative except for: Pain into the back of her leg concerned about sciatic nerve pain, post nasal drip, eye itching with allergies, joint pain, back pain, cold intolerance, decreased concentration  The following were reviewed and entered/updated in epic: Past Medical History:  Diagnosis Date   Aneurysm of ascending aorta (HCC)    a. 4.2-4.3cm in 2014.   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   Insomnia    Irregular heart beat    Normal coronary arteries Cath - 09/2014   Osteoporosis    Raynaud's disease    Patient Active Problem List   Diagnosis Date Noted   Lumbar stenosis 01/08/2023    Priority: High   History of cerebellar stroke 10/05/2020    Priority: High   Memory loss 10/05/2020    Priority: High   history of Breast cancer of lower-outer quadrant of right female breast  01/16/2016    Priority: High   Lower abdominal pain 06/28/2010    Priority: High   Aneurysm of thoracic aorta (HCC) 05/06/2007    Priority: High   History of ovarian cancer 09/22/1977    Priority: High   COPD (chronic obstructive pulmonary disease) (HCC) 03/17/2022    Priority: Medium    History of adenomatous polyp of  colon 12/30/2019    Priority: Medium    Mild hyperlipidemia 06/01/2019    Priority: Medium    Chronic cough 01/21/2018    Priority: Medium    Normal coronary arteries 10/17/2014    Priority: Medium    Hx of herpes simplex infection 12/18/2011    Priority: Medium    Esophageal reflux 05/06/2007    Priority: Medium    Osteoporosis 03/18/2007    Priority: Medium    Pain due to onychomycosis of toenails of both feet 12/25/2020    Priority: Low   Chronic idiopathic constipation 03/01/2019    Priority: Low   Genetic testing 07/09/2017    Priority: Low   Palpitations 11/13/2014    Priority: Low   Varicose veins of bilateral lower extremities with other complications 03/09/2012    Priority: Low   Generalized anxiety disorder 10/13/2008    Priority: Low   Irritable bowel syndrome 10/13/2008    Priority: Low   Lung nodule 11/25/2007    Priority: Low   Raynaud's syndrome 07/01/2007    Priority: Low   Allergic rhinitis 05/06/2007    Priority: Low   Osteoarthritis of carpometacarpal (CMC) joints of both thumbs 09/26/2021    Priority: 1.   Osteoarthritis of both hands 08/29/2020    Priority: 1.   Bilateral carpal tunnel syndrome 08/29/2020    Priority: 1.  Trigger thumb of left hand 08/29/2020    Priority: 1.   Degenerative arthritis of knee, bilateral 04/08/2019    Priority: 1.   Low back pain 06/28/2010    Priority: 1.   DEGENERATIVE DISC DISEASE, CERVICAL SPINE 05/06/2007    Priority: 1.   Bloating 07/31/2022   Left sided abdominal pain 07/31/2022   Belching 07/31/2022   Past Surgical History:  Procedure Laterality Date   ABDOMINAL HYSTERECTOMY  1979   APPENDECTOMY     BILATERAL SALPINGOOPHORECTOMY     BREAST LUMPECTOMY     COLONOSCOPY  02/2012   LEFT HEART CATHETERIZATION WITH CORONARY ANGIOGRAM N/A 10/17/2014   Procedure: LEFT HEART CATHETERIZATION WITH CORONARY ANGIOGRAM;  Surgeon: Lennette Bihari, MD;  [facility] CATH LAB;  Service: Cardiovascular;  Laterality:  N/A;   ovarian ca s/p taj/bso  1979   SIMPLE MASTECTOMY WITH AXILLARY SENTINEL NODE BIOPSY Right 05/05/2017   Procedure: RIGHT TOTAL MASTECTOMY WITH RIGHT  AXILLARY SENTINEL NODE BIOPSY;  Surgeon: Emelia Loron, MD;  Location: [facility] OR;  Service: General;  Laterality: Right;  PECTORAL BLOCK    Family History  Problem Relation Age of Onset   Hip fracture Mother    Other Father        lived to 24   Healthy Brother    Colon polyps Brother    Breast cancer Cousin 80       mat first cousin related through uncle   Esophageal cancer Cousin 52       mat first cousin related through uncle   Colon cancer Neg Hx    Pancreatic cancer Neg Hx    Stomach cancer Neg Hx     Medications- reviewed and updated Current Outpatient Medications  Medication Sig Dispense Refill   Ascorbic Acid (VITAMIN C) 1000 MG tablet Take 1,000 mg by mouth daily.     aspirin EC (ASPIRIN LOW DOSE) 81 MG tablet TAKE 1 TABLET DAILY (SWALLOW WHOLE) 90 tablet 3   Calcium Carbonate (CALCIUM 500 PO) Take by mouth.     Cholecalciferol 25 MCG (1000 UT) capsule Take by mouth.     COLLAGEN PO Take by mouth.     esomeprazole (NEXIUM) 20 MG capsule Take 1 capsule (20 mg total) by mouth daily. 90 capsule 3   gabapentin (NEURONTIN) 100 MG capsule Take 100 mg by mouth 2 (two) times daily.     MAGNESIUM PO Take 300 mg by mouth.     nitroGLYCERIN (NITROSTAT) 0.4 MG SL tablet Place 1 tablet (0.4 mg total) under the tongue every 5 (five) minutes as needed for chest pain. 25 tablet 3   rosuvastatin (CRESTOR) 5 MG tablet Take 1 tablet (5 mg total) by mouth daily. Take 1 tablet daily by mouth. 90 tablet 3   TART CHERRY PO Take 1 Capful by mouth daily.     valACYclovir (VALTREX) 500 MG tablet Take 1 tablet (500 mg total) by mouth 2 (two) times a week. 26 tablet 3   No current facility-administered medications for this visit.    Allergies-reviewed and updated Allergies  Allergen Reactions   Prednisone     Fatigue and depressed mood on  7 day 40mg  x 3 days, 20 mg x 4 days from Dr. Durene Cal- intolerance   Chlorhexidine Gluconate Rash    Social History   Social History Narrative   Widowed aug 2021. She [place]  [place]. Children: Anayra Stalling, lives in [place], [address], lives  in [place], [place]. 4 grandkids. No greatgrandkids.       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    Objective  Objective:  BP 130/72   Pulse 60   Temp (!) 97 F (36.1 C)   Ht 5\' 5"  (1.651 m)   Wt 139 lb (63 kg)   LMP  (LMP Unknown)   SpO2 100%   BMI 23.13 kg/m  Gen: NAD, resting comfortably HEENT: Mucous membranes are moist. Oropharynx normal Neck: no thyromegaly CV: RRR no murmurs rubs or gallops Lungs: CTAB no crackles, wheeze, rhonchi Abdomen: soft/nontender/nondistended/normal bowel sounds. No rebound or guarding.  Ext: no edema Skin: warm, dry Neuro: grossly normal, moves all extremities, PERRLA   Assessment and Plan   89 y.o. female presenting for annual physical.  Health Maintenance counseling: 1. Anticipatory guidance: Patient counseled regarding regular dental exams -q6 months, eye exams - yearly,  avoiding smoking and second hand smoke , limiting alcohol to 1 beverage per day- doesn't drink , no illicit drugs .   2. Risk factor reduction:  Advised patient of need for regular exercise and diet rich and fruits and vegetables to reduce risk of heart attack and stroke.  Exercise- stretches for her back and trying to walk doinly 20 minutes- good for her osteoporosis.  Diet/weight management-weight within 1 pound of last year-tries eat reasonably healthy but does have a somewhat sensitive stomach.  Wt Readings from Last 3 Encounters:  10/13/23 139 lb (63 kg)  10/06/23 140 lb (63.5 kg)  08/31/23 140 lb (63.5 kg)  3. Immunizations/screenings/ancillary studies-declines Shingrix for now.  Also holding off on RSV  and COVID.  Offered flu shot and Prevnar 20 - may reconsider in future Immunization History  Administered Date(s) Administered   Fluad Quad(high Dose 65+) 06/01/2019, 06/11/2020, 06/26/2021, 07/09/2022   Influenza Split 06/26/2011, 07/07/2012   Influenza Whole 07/09/1999, 07/01/2007, 06/27/2008, 07/11/2009, 06/28/2010   Influenza, High Dose Seasonal PF 07/13/2013, 06/26/2015, 07/15/2017, 06/21/2018   Influenza,inj,Quad PF,6+ Mos 06/22/2014   Influenza,inj,quad, With Preservative 06/01/2019   Influenza-Unspecified 08/15/2016   PFIZER(Purple Top)SARS-COV-2 Vaccination 10/14/2019, 11/05/2019   Pneumococcal Polysaccharide-23 12/20/2012, 11/04/2018   Td 09/23/2003   Tdap 06/04/2020   Zoster, Live 01/19/2013   4. Cervical cancer screening- last completed in 2010-past age based screening recommendations.  No vaginal discharge or bleeding. No GYN. History of ovarian cancer  5. Breast cancer screening-history  breast cancer in 2018 on the right-follows with Dr. Dwain Sarna for breast exams yearly and most recent mammogram April 2024- will be due in 2025  6. Colon cancer screening - March 2021 Dr. Reubin [facility]- no repeat EGD or colonoscopy per his notes.  Later seen by Dr. Russella Dar  7. Skin cancer screening-sees Dr. Emily Filbert  . advised regular sunscreen use. Denies worrisome, changing, or new skin lesions 8. Birth control/STD check-widowed/not sexually active  9. Osteoporosis screening at 65- see below 10. Smoking associated screening -never smoker  Status of chronic or acute concerns   # Fatigue and memory concerns S: At September visit patient reported 3 months of fatigue and memory changes as well as sinus fullness.  Did have mild decline in memory since stroke neuroimaging in 2021 - MRI of the brain largely reassuring - Had considered sinus CT- ended up not doing this  -TSH and CBC largely reassuring and had opted out of syphilis and HIV as very low risk - Very mild hyponatremia but pointed  toward  lack of salt in diet.  Cortisol level September 2023 not suggestive of cortisol deficiency - Had considered formal MMSE testing- 6 cit of 0 in January 2025  A/P: ongoing memory issues but roughtly stable- continue to monitor  -fatigue is better    #History of cerebellar stroke with some balance and memory issues/hyperlipidemia S: Patient complained of issues with memory loss-on MRI of brain 09/17/2020-"Remote right cerebellar infarct."  We started aspirin 81 mg as well as rosuvastatin 5 mg daily with LDL goal less than 70  Memory and balance issues could be associated with prior stroke Lab Results  Component Value Date   CHOL 116 06/05/2023   HDL 61.10 06/05/2023   LDLCALC 47 06/05/2023   LDLDIRECT 64.0 01/08/2023   TRIG 38.0 06/05/2023   CHOLHDL 2 06/05/2023   A/P: well controlled continue current medications  - feels she has loosened diet some and wants to make sure stable- ordered lipid panel today  #lumbar stenosis- noted MRI September 2023 with DrDenyse Amass. Also saw Dr. Christell Constant orthopedics 10/17/22 most recently- considering input -gabapentin 100 mg twice daily per scalp issues- mainly just at night  -Last visit had wanted to try working with Dr. Ottis Stain a local chiropractor today she reports she tried twice and helped those times but as continued had issues with the adjustments so appropriately stopped.   - gets tight in front of her legs- she is considering massage -she may go back to Dr. Christell Constant to consider injections  #%- Aneurysm of thoracic aorta S: Gets MRI/MRI with Dr. Laneta Simmers every other year-most recently Feb 11 2023.  Blood pressure goal less than 130/80 -Patient with normal coronary arteries in the past but still follows with Dr. Eden Emms every 6 months- palpitations with PVCs/PACs in the past A/P: doing well/stable- continue to monitor     #% Esophageal reflux/history of chronic cough- has seen Dr. Marchelle Gearing in the past S: Compliant with Nexium 20mg  every day  -Pepcid in past-  now off A/P: reasonable control- continue current medications     #Osteoporosis S: Patient on calcium and vitamin D.  Took Fosamax for years.  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  DEXA October 2018 showed worst T score at -3.0 lumbar spine. On different machine in 07/2019 worst t score - 2.2 RFN and LFN -2020 declined Prolia -Nexium not ideal for osteoporosis  A/P: no statistically significant change in November 2024- wants to hold off on meds  Last vitamin D Lab Results  Component Value Date   VD25OH 59.86 06/26/2021  - check D today  # COPD noted on prior x-rays.  Occasional wheeze in past.  Never smoker but History of smoke exposure but largely asymptomatic- continue to monitor  Last vitamin D  Recommended follow up: Return in about 6 months (around 04/11/2024) for followup or sooner if needed.Schedule b4 you leave. Future Appointments  Date Time Provider Department Center  11/11/2023 10:15 AM Freddie Breech, DPM [facility]  10/06/2024  9:20 AM [facility] ANNUAL WELLNESS VISIT 1 [facility]   Lab/Order associations: fasting   ICD-10-CM   1. Preventative health care  Z00.00     2. Mild hyperlipidemia  E78.5 Comprehensive metabolic panel    CBC with Differential/Platelet    Lipid panel    3. Thoracic aortic aneurysm without rupture, unspecified part (HCC)  I71.20     4. Chronic obstructive pulmonary disease, unspecified COPD type (HCC) Chronic J44.9     5. Other  osteoporosis, unspecified pathological fracture presence  M81.8 VITAMIN D 25 Hydroxy (Vit-D Deficiency, Fractures)      No orders of the defined types were placed in this encounter.   Return precautions advised.  Tana Conch, MD

## 2023-10-29 ENCOUNTER — Telehealth: Payer: Self-pay

## 2023-10-29 DIAGNOSIS — K219 Gastro-esophageal reflux disease without esophagitis: Secondary | ICD-10-CM

## 2023-10-29 DIAGNOSIS — R197 Diarrhea, unspecified: Secondary | ICD-10-CM

## 2023-10-29 DIAGNOSIS — R1030 Lower abdominal pain, unspecified: Secondary | ICD-10-CM

## 2023-10-29 NOTE — Telephone Encounter (Signed)
 Referral has been placed, called and lm for pt tcb.  Copied from CRM [id number]. Topic: General - Other >> Oct 29, 2023 12:14 PM Deidre T wrote: Reason for CRM: patient is needing a referral to a gastro intestinal dr the one she was referred to has retired she would like a call back regarding this

## 2023-11-11 ENCOUNTER — Ambulatory Visit (INDEPENDENT_AMBULATORY_CARE_PROVIDER_SITE_OTHER): Payer: Medicare Other | Admitting: Podiatry

## 2023-11-11 ENCOUNTER — Encounter: Payer: Self-pay | Admitting: Podiatry

## 2023-11-11 VITALS — Ht 65.0 in | Wt 139.0 lb

## 2023-11-11 DIAGNOSIS — I739 Peripheral vascular disease, unspecified: Secondary | ICD-10-CM

## 2023-11-11 DIAGNOSIS — M79674 Pain in right toe(s): Secondary | ICD-10-CM | POA: Diagnosis not present

## 2023-11-11 DIAGNOSIS — B351 Tinea unguium: Secondary | ICD-10-CM | POA: Diagnosis not present

## 2023-11-11 DIAGNOSIS — Q828 Other specified congenital malformations of skin: Secondary | ICD-10-CM

## 2023-11-11 DIAGNOSIS — M79675 Pain in left toe(s): Secondary | ICD-10-CM | POA: Diagnosis not present

## 2023-11-11 NOTE — Progress Notes (Signed)
  Subjective:  Patient ID: Cindy Schwartz, female    DOB: [birth year],  MRN: [id number]  89 y.o. female presents painful porokeratotic lesion(s) b/l feet and painful mycotic toenails that limit ambulation. Painful toenails interfere with ambulation. Aggravating factors include wearing enclosed shoe gear. Pain is relieved with periodic professional debridement. Painful porokeratotic lesions are aggravated when weightbearing with and without shoegear. Pain is relieved with periodic professional debridement. Chief Complaint  Patient presents with   RFC    She is here for nail trim, PCP is Dr. Durene Cal and seen 6 months ago.     New problem(s): None   PCP is Shelva Majestic, MD.  Allergies  Allergen Reactions   Prednisone     Fatigue and depressed mood on 7 day 40mg  x 3 days, 20 mg x 4 days from Dr. Durene Cal- intolerance   Chlorhexidine Gluconate Rash    Review of Systems: Negative except as noted in the HPI.   Objective:  Cindy Schwartz is a pleasant 89 y.o. female WD, WN in NAD. AAO x 3.  Vascular Examination: Palpable pedal pulses. CFT immediate b/l. Pedal hair present. No edema. No pain with calf compression b/l. Skin temperature gradient WNL b/l. No varicosities noted. No cyanosis or clubbing noted.  Neurological Examination: Sensation grossly intact b/l with 10 gram monofilament. Vibratory sensation intact b/l.  Dermatological Examination: Pedal skin with normal turgor, texture and tone b/l. No open wounds nor interdigital macerations noted. Toenails 1-5 b/l thick, discolored, elongated with subungual debris and pain on dorsal palpation. Porokeratotic lesion(s) submet head 2 left foot and submet head 2 right foot. No erythema, no edema, no drainage, no fluctuance.  Musculoskeletal Examination: Muscle strength 5/5 to b/l LE.  No pain, crepitus noted b/l. No gross pedal deformities. Patient ambulates independently without assistive aids.   Radiographs: None  Last A1c:       No data to  display           Assessment:   1. Pain due to onychomycosis of toenails of both feet   2. Porokeratosis   3. PVD (peripheral vascular disease) (HCC)    Plan:  Patient was evaluated and treated. All patient's and/or POA's questions/concerns addressed on today's visit. Mycotic toenails 1-5 debrided in length and girth without incident. Porokeratotic lesion(s) submet head 2 b/l pared with sharp debridement without incident. Continue soft, supportive shoe gear daily. Report any pedal injuries to medical professional. Call office if there are any questions/concerns. -Patient/POA to call should there be question/concern in the interim.  Return in about 4 months (around 03/10/2024).  Freddie Breech, DPM      [place] LOCATION: [address] [place] [address]                   Office [phone]   [facility] LOCATION: [address] [place], [place] [address] Office [phone]

## 2023-12-03 ENCOUNTER — Ambulatory Visit (INDEPENDENT_AMBULATORY_CARE_PROVIDER_SITE_OTHER): Admitting: Gastroenterology

## 2023-12-03 ENCOUNTER — Encounter: Payer: Self-pay | Admitting: Gastroenterology

## 2023-12-03 VITALS — BP 130/78 | HR 78 | Ht 65.0 in | Wt 138.4 lb

## 2023-12-03 DIAGNOSIS — K219 Gastro-esophageal reflux disease without esophagitis: Secondary | ICD-10-CM

## 2023-12-03 DIAGNOSIS — R109 Unspecified abdominal pain: Secondary | ICD-10-CM | POA: Diagnosis not present

## 2023-12-03 DIAGNOSIS — R140 Abdominal distension (gaseous): Secondary | ICD-10-CM

## 2023-12-03 DIAGNOSIS — R142 Eructation: Secondary | ICD-10-CM

## 2023-12-03 NOTE — Progress Notes (Addendum)
 Chief Complaint:follow-up, bloating, constipation Primary GI Doctor: (previously Dr. Russella Dar) Dr. Doy Hutching  HPI:  This is an 89 year old female, previously known to Dr. Russella Dar, last seen in June 2024, with lower abdominal pain and LUQ pain for 3 weeks. Symptoms often increase after meals. Mild constipation persists.  She has had a mild change in bowel habits with occasional thinner stools and more frequent stools.  She recently started a magnesium supplement for constipation.  She relates significant fatigue over the past few weeks.  She had diarrhea illness in Feb 2024. Campylobacter was diagnosed and treated.  Her reflux symptoms are controlled. Denies weight loss, melena, hematochezia, nausea, vomiting, dysphagia, chest pain. Small number of RBCs on UA. Na is slightly low, which has been noted previously.Blood work otherwise normal.  03/18/23 CT AP IMPRESSION: Colonic diverticulosis, without radiographic evidence of diverticulitis or other acute findings.  Large stool burden noted; recommend clinical correlation for possible constipation. Aortic Atherosclerosis (ICD10-I70.0). --Recommended patient start Miralax qd to bid (not prn) titrate dose for a complete BM daily   Interval History Patient presents today with gastrointestinal issues she states she has had several years. Her first complaint is of burping, it will even wake her up at night. No carbonated drinks. No straws. She doe admit she eats quickly at times. She has history of GERD and takes Nexium 20 mg in the morning and OTC Tums as needed. This does not help with the burping. Patient denies dysphagia. Patient denies nausea, vomiting, or weight loss. Appetite good. No dark stools or blood in stool. She reports abdominal discomfort she describes as feeling something pass through her digestion. She notes a lot of bloating. She cannot pas gas. No known food triggers. Patient has 1-2 bowel movements per day. She is not requiring any laxatives,  but she does take OTC magnesium. History shows constipation, but she states she has not had constipation for quite some time. During exam I noted she wears tight spanx. She states this makes her feel better than without.  Wt Readings from Last 3 Encounters:  12/03/23 138 lb 6 oz (62.8 kg)  11/11/23 139 lb (63 kg)  10/13/23 139 lb (63 kg)     Past Medical History:  Diagnosis Date   Aneurysm of ascending aorta (HCC)    a. 4.2-4.3cm in 2014.   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   Insomnia    Irregular heart beat    Normal coronary arteries Cath - 09/2014   Osteoporosis    Raynaud's disease     Past Surgical History:  Procedure Laterality Date   ABDOMINAL HYSTERECTOMY  1979   APPENDECTOMY     BILATERAL SALPINGOOPHORECTOMY     BREAST LUMPECTOMY     COLONOSCOPY  02/2012   LEFT HEART CATHETERIZATION WITH CORONARY ANGIOGRAM  N/A 10/17/2014   Procedure: LEFT HEART CATHETERIZATION WITH CORONARY ANGIOGRAM;  Surgeon: Lennette Bihari, MD;  Location: [facility] CATH LAB;  Service: Cardiovascular;  Laterality: N/A;   ovarian ca s/p taj/bso  1979   SIMPLE MASTECTOMY WITH AXILLARY SENTINEL NODE BIOPSY Right 05/05/2017   Procedure: RIGHT TOTAL MASTECTOMY WITH RIGHT  AXILLARY SENTINEL NODE BIOPSY;  Surgeon: Emelia Loron, MD;  Location: [facility] OR;  Service: General;  Laterality: Right;  PECTORAL BLOCK     Current Outpatient Medications  Medication Sig Dispense Refill   Ascorbic Acid (VITAMIN C) 1000 MG tablet Take 1,000 mg by mouth daily.     aspirin EC (ASPIRIN LOW DOSE) 81 MG tablet TAKE 1 TABLET DAILY (SWALLOW WHOLE) 90 tablet 3   Calcium Carbonate (CALCIUM 500 PO) Take by mouth.     Cholecalciferol 25 MCG (1000 UT) capsule Take by mouth.     COLLAGEN PO Take by mouth.     esomeprazole (NEXIUM) 20 MG capsule Take 1 capsule (20 mg total) by mouth daily. 90 capsule 3   gabapentin (NEURONTIN) 100 MG capsule Take 100 mg by mouth 2 (two) times daily. (Patient not taking: Reported on 12/03/2023)     MAGNESIUM PO Take 300 mg by mouth.     nitroGLYCERIN (NITROSTAT) 0.4 MG SL tablet Place 1 tablet (0.4 mg total) under the tongue every 5 (five) minutes as needed for chest pain. 25 tablet 3   rosuvastatin (CRESTOR) 5 MG tablet Take 1 tablet (5 mg total) by mouth daily. Take 1 tablet daily by mouth. 90 tablet 3   TART CHERRY PO Take 1 Capful by mouth daily.     valACYclovir (VALTREX) 500 MG tablet Take 1 tablet (500 mg total) by mouth 2 (two) times a week. 26 tablet 3   No current facility-administered medications for this visit.    Allergies as of 12/03/2023 - Review Complete 12/03/2023  Allergen Reaction Noted   Prednisone  03/17/2022   Chlorhexidine gluconate Rash 02/08/2016    Family History  Problem Relation Age of Onset   Hip fracture Mother    Other Father        lived to 36   Healthy Brother    Colon polyps Brother    Breast cancer Cousin 80       mat first cousin related through uncle   Esophageal cancer Cousin 77       mat first cousin related through uncle   Colon cancer Neg Hx    Pancreatic cancer Neg Hx    Stomach cancer Neg Hx     Review of Systems:    Constitutional: No weight loss, fever, chills, weakness or fatigue HEENT: Eyes: No change in vision               Ears, Nose, Throat:  No change in hearing or congestion Skin: No rash or itching Cardiovascular: No  chest pain, chest pressure or palpitations   Respiratory: No SOB or cough Gastrointestinal: See HPI and otherwise negative Genitourinary: No dysuria or change in urinary frequency Neurological: No headache, dizziness or syncope Musculoskeletal: No new muscle or joint pain Hematologic: No bleeding or bruising Psychiatric: No history of depression or anxiety    Physical Exam:  Vital signs: BP 130/78 (BP Location: Left Arm, Patient Position: Sitting, Cuff Size: Normal)   Pulse 78   Ht 5\' 5"  (1.651 m)   Wt 138 lb 6 oz (62.8 kg)   LMP  (LMP Unknown)   SpO2 96%   BMI 23.03  kg/m   Constitutional:   Pleasant  female appears to be in NAD, Well developed, Well nourished, alert and cooperative Throat: Oral cavity and pharynx without inflammation, swelling or lesion.  Respiratory: Respirations even and unlabored. Lungs clear to auscultation bilaterally.   No wheezes, crackles, or rhonchi.  Cardiovascular: Normal S1, S2. Regular rate and rhythm. No peripheral edema, cyanosis or pallor.  Gastrointestinal:  Soft, nondistended, generalized tenderness with palpation. No rebound or guarding. Normal bowel sounds. No appreciable masses or hepatomegaly. Rectal:  Not performed.  Msk:  Symmetrical without gross deformities. Without edema, no deformity or joint abnormality.  Neurologic:  Alert and  oriented x4;  grossly normal neurologically.  Skin:   Dry and intact without significant lesions or rashes. Psychiatric: Oriented to person, place and time. Demonstrates good judgement and reason without abnormal affect or behaviors.  RELEVANT LABS AND IMAGING: CBC    Latest Ref Rng & Units 10/13/2023   10:44 AM 05/11/2023    3:14 PM 03/09/2023   10:15 AM  CBC  WBC 4.0 - 10.5 K/uL 5.7  7.0  7.1   Hemoglobin 12.0 - 15.0 g/dL 91.4  78.2  95.6   Hematocrit 36.0 - 46.0 % 39.2  38.8  39.9   Platelets 150.0 - 400.0 K/uL 229.0  247.0  227.0      CMP     Latest Ref Rng & Units 10/13/2023   10:44 AM 06/05/2023    10:09 AM 05/11/2023    3:14 PM  CMP  Glucose 70 - 99 mg/dL 89  81  98   BUN 6 - 23 mg/dL 12  16  18    Creatinine 0.40 - 1.20 mg/dL 2.13  0.86  5.78   Sodium 135 - 145 mEq/L 137  134  130   Potassium 3.5 - 5.1 mEq/L 4.0  4.2  4.9   Chloride 96 - 112 mEq/L 101  98  96   CO2 19 - 32 mEq/L 29  29  29    Calcium 8.4 - 10.5 mg/dL 9.7  9.6  9.9   Total Protein 6.0 - 8.3 g/dL 7.1  7.1  7.0   Total Bilirubin 0.2 - 1.2 mg/dL 0.5  0.6  0.4   Alkaline Phos 39 - 117 U/L 70  80  76   AST 0 - 37 U/L 23  21  18    ALT 0 - 35 U/L 15  14  11       Lab Results  Component Value Date   TSH 1.19 05/11/2023   02/24/23 echo- Left ventricular ejection fraction, by estimation, is 60 to 65%.  12/08/19 EGD/colonoscopy with Dr. Matthias Hughs Colonoscopy- Impression: Diverticulosis of the sigmoid colon. Three diminutive polyps in the cecum.  Biopsied. Two diminutive poylps in the transverse colon. Biopsied. The distal rectum and anal verge are normal on the retroflexion view. No definite pathology seen to account for patients abdominal pain. Path: Final microscopic diagnosis Small intestine, duodenum biopsy Focal incidental lymphangiectasia, otherwise bowel mucosa with no significant Pathologic changes.  Negative for features of celiac disease. Giardia Organisms not present. Stomach anntrum bx reactive gastropathy. H pylori organisms not present. large intestine-cecum poylp Tubular adenomas (2) polypoid colonic mucosa with lymphoid aggregate. negative for dysplasia. Large intestine- transverse colon, polyp. Tubular adenoma. sessile serrated adenoma/poylp. Assessment: Encounter Diagnoses  Name Primary?   Abdominal discomfort Yes   Bloating    Burping    Gastroesophageal reflux disease, unspecified whether esophagitis present    89 year old female patient who presents with  chronic issues of burping, bloating, and abdominal discomfort. She has not had any resolution in symptoms with dietary changes or  antiacids. We did discuss her eating habits and taking her time eating during meals. Recommend no straws or carbonated drinks. She will continue Nexium 20mg  po daily for her GERD. For the bloating and burping patient requesting to do Sibo breath test which is reasonable. I will also give her Low FODmap diet. I will provide her samples of IBgard as well. She had concerns about taking dicyclomine prescribed at previous visit due to potential side effects. She denies having constipation today, but does have a history.   Plan: - Order Sibo test  - Samples of IBgard provided, 2 capsule with meals - Recommend Low FODmap diet  - Continue Nexium 20mg  po daily for GERD - Follow-up with Dr. Doy Hutching in 3 mths per patient request  Thank you for the courtesy of this consult. Please call me with any questions or concerns.   Sierra Spargo, FNP-C [facility] 12/03/2023, 2:08 PM  Cc: Shelva Majestic, MD  I have reviewed the clinic note as outlined by Charmaine Downs, NP and agree with the assessment, plan and medical decision making.  Ms. Rosman presents for evaluation of burping, bloating and abdominal discomfort.  Is taking Nexium for symptoms of GERD without relief of belching and bloating.  Agree that it is reasonable to evaluate her for SIBO and trial a low FODMAP diet.  Denies that she is having constipation -CT scan in June 2024 showed large stool burden.  If symptoms persist can consider plain film to reevaluate for constipation.  Maren Beach, MD

## 2023-12-03 NOTE — Patient Instructions (Addendum)
 Take your time with eating at meals, chew food thoroughly. Avoid using straws. Avoid carbonated drinks. Continue Nexium 20mg  po daily  Our office will contact you about your follow up appointment.  We have given you samples of the following medication to take:  Ibgard: 2 capsules with meals  You have been given a testing kit to check for small intestine bacterial overgrowth (SIBO) which is completed by a company named [facility]. Make sure to return your test in the mail using the return mailing label given to you along with the kit. The test order, your demographic and insurance information have all already been sent to the company. [facility] will collect an upfront charge of $99.74 for commercial insurance plans and $209.74 if you are paying cash. Make sure to discuss with [facility] PRIOR to having the test to see if they have gotten information from your insurance company as to how much your testing will cost out of pocket, if any. Please contact [facility] at phone number [phone] to get instructions regarding how to perform the test as our office is unable to give specific testing instructions.  _______________________________________________________  If your blood pressure at your visit was 140/90 or greater, please contact your primary care physician to follow up on this.  _______________________________________________________  If you are age 47 or older, your body mass index should be between 23-30. Your Body mass index is 23.03 kg/m. If this is out of the aforementioned range listed, please consider follow up with your Primary Care Provider.  If you are age 66 or younger, your body mass index should be between 19-25. Your Body mass index is 23.03 kg/m. If this is out of the aformentioned range listed, please consider follow up with your Primary Care Provider.   ________________________________________________________  The [facility] providers would like  to encourage you to use [facility] to communicate with providers for non-urgent requests or questions.  Due to long hold times on the telephone, sending your provider a message by [facility] may be a faster and more efficient way to get a response.  Please allow 48 business hours for a response.  Please remember that this is for non-urgent requests.  _______________________________________________________ Thank you for trusting me with your gastrointestinal care!   Margarite Gouge May, NP

## 2023-12-09 ENCOUNTER — Encounter: Payer: Self-pay | Admitting: Pediatrics

## 2023-12-10 ENCOUNTER — Ambulatory Visit: Admitting: Orthopedic Surgery

## 2023-12-22 ENCOUNTER — Ambulatory Visit: Payer: Medicare Other | Admitting: Gastroenterology

## 2023-12-30 ENCOUNTER — Telehealth: Payer: Self-pay | Admitting: Gastroenterology

## 2023-12-30 ENCOUNTER — Other Ambulatory Visit: Payer: Self-pay | Admitting: Family Medicine

## 2023-12-30 DIAGNOSIS — Z1231 Encounter for screening mammogram for malignant neoplasm of breast: Secondary | ICD-10-CM

## 2023-12-30 NOTE — Telephone Encounter (Signed)
 Please see note below.

## 2023-12-30 NOTE — Telephone Encounter (Signed)
 Jillyn Hidden from [facility] called and stated that he wanted to bring your attention to this patient SIBO test results that he faxed over to [place]. Jillyn Hidden stated that although the test results specify that it was negative he want to redirect your attention over to where it states 14. Jillyn Hidden stated that it was one point away from 15 which would have resulted into positive. If you have any question please feel free to contact gary at [phone]. Please advise.

## 2023-12-31 ENCOUNTER — Other Ambulatory Visit: Payer: Self-pay | Admitting: Gastroenterology

## 2023-12-31 DIAGNOSIS — K580 Irritable bowel syndrome with diarrhea: Secondary | ICD-10-CM

## 2023-12-31 MED ORDER — RIFAXIMIN 550 MG PO TABS: 550.0000 mg | ORAL_TABLET | Freq: Three times a day (TID) | ORAL | 2 refills | Status: AC

## 2023-12-31 NOTE — Progress Notes (Signed)
 Spoke with pt to discuss Sibo results, borderline combined methane and hydrogen results. Discussed with Dr. Doy Hutching, will go ahead and treat given her symptoms with Xifaxan. Pt notified of above.

## 2024-01-12 ENCOUNTER — Other Ambulatory Visit (HOSPITAL_COMMUNITY): Payer: Self-pay

## 2024-01-12 ENCOUNTER — Telehealth: Payer: Self-pay

## 2024-01-12 ENCOUNTER — Telehealth: Payer: Self-pay | Admitting: Gastroenterology

## 2024-01-12 NOTE — Telephone Encounter (Signed)
 Patient returned call. States she will be home all afternoon.

## 2024-01-12 NOTE — Telephone Encounter (Signed)
 Pharmacy Patient Advocate Encounter   Received notification from Patient Pharmacy that prior authorization for Xifaxan  550MG  tablets is required/requested.   Insurance verification completed.   The patient is insured through [employer] .   Prior Authorization for Xifaxan  550MG  tablets has been APPROVED from 12-13-2023 to 01-26-2024   PA #/Case ID/Reference #: [id number] [id number]

## 2024-01-12 NOTE — Telephone Encounter (Signed)
 Patient called and stated that she had did a SIBO test and was prescribed some medication but she has ot been able to pick up the medication from the pharmacy. Patient stated that the pharmacy called her and stated that this medication was not covered by her insurance and would have to pay out of pocket. Patient also mentioned that her pharmacy was suppose to get in contact with us  to see what other medication she could take. Patient is requesting to speak to the nurse or  provider. Please advise.

## 2024-01-12 NOTE — Telephone Encounter (Signed)
 PA request has been Approved. New Encounter has been or will be created for follow up. For additional info see Pharmacy Prior Auth telephone encounter from 01-12-2024.

## 2024-01-12 NOTE — Telephone Encounter (Signed)
 Left message for pt to call back

## 2024-01-12 NOTE — Telephone Encounter (Signed)
 Pt stated that the medication that was prescribed for her is very expensive.  rifaximin  (XIFAXAN ) 550 MG TABS tablet  Pt was notified that this will be sent to our PA to to assist.  Please assist with PA if needed and hopefully this will decrease the price significantly.

## 2024-01-12 NOTE — Telephone Encounter (Signed)
 Pt made aware that the PA has been approved.  Pt was notified to notify our office if the medication is still expensive after the approval.  Pt verbalized understanding with all questions answered.

## 2024-01-13 ENCOUNTER — Ambulatory Visit: Payer: Self-pay

## 2024-01-13 NOTE — Telephone Encounter (Signed)
 Chief Complaint: cough  Symptoms: cough with blood Frequency: 3-4 Pertinent Negatives: Patient denies fever or sob Disposition: [] ED /[] Urgent Care (no appt availability in office) / [x] Appointment(In office/virtual)/ []  [facility]/ [] Home Care/ [] Refused Recommended Disposition /[] [facility]/ []  Follow-up with PCP Additional Notes: states that cough started about 4-5 days ago and noticed about 3 days ago she coughs up mucus in the morning and it has blood in it. States throat very irritated. States that it is rusty brown colored like old blood. States she doesn't want to be seen today due to her daughter in town.  Copied from CRM [id number]. Topic: Clinical - Red Word Triage >> Jan 13, 2024  8:19 AM Kita Perish H wrote: [facility] that prompted transfer to Nurse Triage: Coughing up blood in mornings. Throat a little irritated. Reason for Disposition  Coughing up rusty-colored sputum  Answer Assessment - Initial Assessment Questions 1. ONSET: "When did the cough begin?"      4-5 days ago 2. SEVERITY: "How bad is the cough today?" "Did the blood appear after a coughing spell?"      Not bad 3. SPUTUM: "Describe the color of your sputum" (none, dry cough; clear, white, yellow, green)     Clear-brown 4. HEMOPTYSIS: "How much blood?" (flecks, streaks, tablespoons, etc.)     Small brown flecks 5. DIFFICULTY BREATHING: "Are you having difficulty breathing?" If Yes, ask: "How bad is it?" (e.g., mild, moderate, severe)    - MILD: No SOB at rest, mild SOB with walking, speaks normally in sentences, can lie down, no retractions, pulse < 100.    - MODERATE: SOB at rest, SOB with minimal exertion and prefers to sit, cannot lie down flat, speaks in phrases, mild retractions, audible wheezing, pulse 100-120.    - SEVERE: Very SOB at rest, speaks in single words, struggling to breathe, sitting hunched forward, retractions, pulse > 120      no 6. FEVER: "Do you have a fever?" If Yes, ask:  "What is your temperature, how was it measured, and when did it start?"     no 7. CARDIAC HISTORY: "Do you have any history of heart disease?" (e.g., heart attack, congestive heart failure)      no 8. LUNG HISTORY: "Do you have any history of lung disease?"  (e.g., pulmonary embolus, asthma, emphysema)     no 9. PE RISK FACTORS: "Do you have a history of blood clots?" (or: recent major surgery, recent prolonged travel, bedridden)     no 10. OTHER SYMPTOMS: "Do you have any other symptoms?" (e.g., runny nose, wheezing, chest pain)       no  Protocols used: Coughing Up Blood-A-AH

## 2024-01-13 NOTE — Telephone Encounter (Signed)
Ov scheduled for today.

## 2024-01-14 ENCOUNTER — Encounter: Payer: Self-pay | Admitting: Family

## 2024-01-14 ENCOUNTER — Ambulatory Visit (INDEPENDENT_AMBULATORY_CARE_PROVIDER_SITE_OTHER): Admitting: Family

## 2024-01-14 VITALS — BP 133/76 | HR 69 | Temp 97.5°F | Ht 65.0 in | Wt 138.6 lb

## 2024-01-14 DIAGNOSIS — J302 Other seasonal allergic rhinitis: Secondary | ICD-10-CM | POA: Diagnosis not present

## 2024-01-14 DIAGNOSIS — R0982 Postnasal drip: Secondary | ICD-10-CM

## 2024-01-14 DIAGNOSIS — K219 Gastro-esophageal reflux disease without esophagitis: Secondary | ICD-10-CM | POA: Diagnosis not present

## 2024-01-14 NOTE — Progress Notes (Signed)
 Patient ID: Cindy Schwartz, female    DOB: 04-Sep-1934, 89 y.o.   MRN: [id number]  Chief Complaint  Patient presents with   Cough    Pt c/o slight Cough with specks of blood and itchy throat on left side, Present for 1 week.   Discussed the use of AI scribe software for clinical note transcription with the patient, who gave verbal consent to proceed.  History of Present Illness The patient, with a history of sinusitis and stomach issues, presents with throat clearing and states that yesterday she noticed reddish-brown drops in her mucus. She describes this as 'brownish' and 'like drops of blood.' The patient reports that the hemoptysis occurred 'several times' yesterday, but not today and not previously. She denies any other sinus symptoms, no cough, no fever. She also reports having a metallic taste in her mouth yesterday as well. The patient has been experiencing these symptoms for the past four to five days. She reports taking her Nexium  for GERD sx daily. She has noticed some of her pills are difficult to swallow and wonders if this could be irritating her throat. The patient also reports feeling weaker than usual, despite eating well. She has been taking Nexium  for her stomach issues and recently started taking Xifaxan . She also takes rosuvastatin  for cholesterol management and occasionally takes a baby aspirin .  Assessment & Plan Throat mucus with blood-tinged mucus Intermittent blood-tinged mucus likely due to mucosal irritation from throat clearing. No active bleeding, infection, or significant lung pathology. Lungs clear on exam. Nothing abnormal noted on throat exam. - Start OTC Benadryl (pt states it does not make her drowsy) or Claritin  (pt has at home) to reduce postnasal drip. - Monitor for recurrence of blood-tinged mucus, especially if mucus becomes more frequent, thicker, or cough develops and call the office. - Ensure adequate hydration before taking medications, cut in half, or take  with pudding or applesauce.  Allergic rhinitis Possible allergic rhinitis contributing to increased mucus production, likely related to current allergy  season. No active infection. - Consider Benadryl, Claritin  or Allegra once daily for symptom improvement. - Use saline nasal spray as needed 2-3x/day. - Report if blood-tinged mucus recurs.  Gastroesophageal reflux disease (GERD) GERD managed with Nexium . Occasional throat irritation possibly related to large pills. - Continue Nexium  as prescribed. - Break large pills or take with pudding/applesauce to prevent mucosal irritation. - Ensure adequate hydration before taking pills.    Subjective:    Outpatient Medications Prior to Visit  Medication Sig Dispense Refill   Ascorbic Acid (VITAMIN C) 1000 MG tablet Take 1,000 mg by mouth daily.     aspirin  EC (ASPIRIN  LOW DOSE) 81 MG tablet TAKE 1 TABLET DAILY (SWALLOW WHOLE) 90 tablet 3   Calcium  Carbonate (CALCIUM  500 PO) Take by mouth.     Cholecalciferol 25 MCG (1000 UT) capsule Take by mouth.     COLLAGEN PO Take by mouth.     esomeprazole  (NEXIUM ) 20 MG capsule Take 1 capsule (20 mg total) by mouth daily. 90 capsule 3   gabapentin  (NEURONTIN ) 100 MG capsule Take 100 mg by mouth 2 (two) times daily.     MAGNESIUM  PO Take 300 mg by mouth.     nitroGLYCERIN  (NITROSTAT ) 0.4 MG SL tablet Place 1 tablet (0.4 mg total) under the tongue every 5 (five) minutes as needed for chest pain. 25 tablet 3   rifaximin  (XIFAXAN ) 550 MG TABS tablet Take 1 tablet (550 mg total) by mouth 3 (three) times daily for 14  days. 42 tablet 2   rosuvastatin  (CRESTOR ) 5 MG tablet Take 1 tablet (5 mg total) by mouth daily. Take 1 tablet daily by mouth. 90 tablet 3   TART CHERRY PO Take 1 Capful by mouth daily.     valACYclovir  (VALTREX ) 500 MG tablet Take 1 tablet (500 mg total) by mouth 2 (two) times a week. 26 tablet 3   No facility-administered medications prior to visit.   Past Medical History:  Diagnosis Date    Aneurysm of ascending aorta (HCC)    a. 4.2-4.3cm in 2014.   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   Insomnia    Irregular heart beat    Normal coronary arteries Cath - 09/2014   Osteoporosis    Raynaud's disease    Past Surgical History:  Procedure Laterality Date   ABDOMINAL HYSTERECTOMY  1979   APPENDECTOMY     BILATERAL SALPINGOOPHORECTOMY     BREAST LUMPECTOMY     COLONOSCOPY  02/2012   LEFT HEART CATHETERIZATION WITH CORONARY ANGIOGRAM N/A 10/17/2014   Procedure: LEFT HEART CATHETERIZATION WITH CORONARY ANGIOGRAM;  Surgeon: Millicent Ally, MD;  Location: [facility] CATH LAB;  Service: Cardiovascular;  Laterality: N/A;   ovarian ca s/p taj/bso  1979   SIMPLE MASTECTOMY WITH AXILLARY SENTINEL NODE BIOPSY Right 05/05/2017   Procedure: RIGHT TOTAL MASTECTOMY WITH RIGHT  AXILLARY SENTINEL NODE BIOPSY;  Surgeon: Enid Harry, MD;  Location: [facility] OR;  Service: General;  Laterality: Right;  PECTORAL BLOCK   Allergies  Allergen Reactions   Prednisone      Fatigue and depressed mood on 7 day 40mg  x 3 days, 20 mg x 4 days from Dr. Arlene Ben- intolerance   Chlorhexidine  Gluconate Rash      Objective:    Physical Exam Vitals and nursing note reviewed.  Constitutional:      Appearance: Normal appearance.  HENT:     Right Ear: Tympanic membrane and ear canal normal.     Left Ear: Tympanic  membrane and ear canal normal.     Nose: No congestion or rhinorrhea.     Mouth/Throat:     Mouth: Mucous membranes are moist.     Pharynx: Postnasal drip (mild) present. No posterior oropharyngeal erythema or uvula swelling.     Tonsils: No tonsillar exudate or tonsillar abscesses.  Cardiovascular:     Rate and Rhythm: Normal rate and regular rhythm.  Pulmonary:     Effort: Pulmonary effort is normal.     Breath sounds: Normal breath sounds.  Musculoskeletal:        General: Normal range of motion.  Skin:    General: Skin is warm and dry.  Neurological:     Mental Status: She is alert.  Psychiatric:        Mood and Affect: Mood normal.  Behavior: Behavior normal.    BP 133/76 (BP Location: Left Arm, Patient Position: Sitting, Cuff Size: Normal)   Pulse 69   Temp (!) 97.5 F (36.4 C) (Temporal)   Ht 5\' 5"  (1.651 m)   Wt 138 lb 9.6 oz (62.9 kg)   LMP  (LMP Unknown)   SpO2 98%   BMI 23.06 kg/m  Wt Readings from Last 3 Encounters:  01/14/24 138 lb 9.6 oz (62.9 kg)  12/03/23 138 lb 6 oz (62.8 kg)  11/11/23 139 lb (63 kg)       Versa Gore, NP

## 2024-01-15 ENCOUNTER — Encounter: Payer: Self-pay | Admitting: Gastroenterology

## 2024-01-29 ENCOUNTER — Ambulatory Visit
Admission: RE | Admit: 2024-01-29 | Discharge: 2024-01-29 | Disposition: A | Discharge status: Home / Self Care | Source: Ambulatory Visit | Attending: Family Medicine | Admitting: Family Medicine

## 2024-01-29 DIAGNOSIS — Z1231 Encounter for screening mammogram for malignant neoplasm of breast: Secondary | ICD-10-CM

## 2024-02-01 ENCOUNTER — Encounter: Payer: Self-pay | Admitting: Family Medicine

## 2024-02-01 ENCOUNTER — Ambulatory Visit: Payer: Self-pay

## 2024-02-01 ENCOUNTER — Ambulatory Visit (INDEPENDENT_AMBULATORY_CARE_PROVIDER_SITE_OTHER): Payer: PRIVATE HEALTH INSURANCE | Admitting: Family Medicine

## 2024-02-01 ENCOUNTER — Ambulatory Visit (INDEPENDENT_AMBULATORY_CARE_PROVIDER_SITE_OTHER)

## 2024-02-01 VITALS — BP 120/68 | HR 85 | Temp 98.1°F | Ht 65.0 in | Wt 137.8 lb

## 2024-02-01 DIAGNOSIS — R042 Hemoptysis: Secondary | ICD-10-CM | POA: Diagnosis not present

## 2024-02-01 DIAGNOSIS — R0789 Other chest pain: Secondary | ICD-10-CM

## 2024-02-01 DIAGNOSIS — R5383 Other fatigue: Secondary | ICD-10-CM

## 2024-02-01 NOTE — Patient Instructions (Addendum)
 It was very nice to see you today!  Worse, ER Take nexium  2x/day for 1 wk  PLEASE NOTE:  If you had any lab tests please let us  know if you have not heard back within a few days. You may see your results on MyChart before we have a chance to review them but we will give you a call once they are reviewed by us . If we ordered any referrals today, please let us  know if you have not heard from their office within the next week.   Please try these tips to maintain a healthy lifestyle:  Eat most of your calories during the day when you are active. Eliminate processed foods including packaged sweets (pies, cakes, cookies), reduce intake of potatoes, white bread, white pasta, and white rice. Look for whole grain options, oat flour or almond flour.  Each meal should contain half fruits/vegetables, one quarter protein, and one quarter carbs (no bigger than a computer mouse).  Cut down on sweet beverages. This includes juice, soda, and sweet tea. Also watch fruit intake, though this is a healthier sweet option, it still contains natural sugar! Limit to 3 servings daily.  Drink at least 1 glass of water with each meal and aim for at least 8 glasses per day  Exercise at least 150 minutes every week.

## 2024-02-01 NOTE — Telephone Encounter (Signed)
 Copied from CRM [id number]. Topic: Clinical - Red Word Triage >> Feb 01, 2024  3:04 PM Melissa C wrote: Red Word that prompted transfer to Nurse Triage: patient has not been feeling well for over a week and has lost a significant amount of weight. She has been feeling kind of out of it as well and doesn't know what to do.  Chief Complaint: Generalized fatigue Symptoms: "Sluggish", low energy, mild SOB Frequency: 10 days Pertinent Negatives: Patient denies fever Disposition: [] ED /[] Urgent Care (no appt availability in office) / [x] Appointment(In office/virtual)/ []  [place] [facility]/ [] Home Care/ [] Refused Recommended Disposition /[] [place] [facility]/ []  Follow-up with PCP Additional Notes: Patient called in to report generalized fatigue and weakness. Patient stated her "whole body doesn't feel right". Patient stated she has been feeling this way for at 10 days. Patient stated she also experiences mild SOB at times. Patient described that she is feeling low energy and sluggish. Advised patient to be seen within 24 hours, per protocol. No availability with PCP. Scheduled with alternate provider in office. Provided care advice and instructed patient to call back if symptoms worsen. Patient complied.   Reason for Disposition  [1] MODERATE weakness (i.e., interferes with work, school, normal activities) AND [2] persists > 3 days  Answer Assessment - Initial Assessment Questions 1. DESCRIPTION: "Describe how you are feeling."     States she is feeling low energy and "just wants to sleep" 2. SEVERITY: "How bad is it?"  "Can you stand and walk?"   - MILD (0-3): Feels weak or tired, but does not interfere with work, school or normal activities.   - MODERATE (4-7): Able to stand and walk; weakness interferes with work, school, or normal activities.   - SEVERE (8-10): Unable to stand or walk; unable to do usual activities.     States she walks and performs ADLs normally, but pushes herself too  much 3. ONSET: "When did these symptoms begin?" (e.g., hours, days, weeks, months)     10 days 4. CAUSE: "What do you think is causing the weakness or fatigue?" (e.g., not drinking enough fluids, medical problem, trouble sleeping)     Not sure 5. NEW MEDICINES:  "Have you started on any new medicines recently?" (e.g., opioid pain medicines, benzodiazepines, muscle relaxants, antidepressants, antihistamines, neuroleptics, beta blockers)     Started taking Xifaxan  "550 MG x 3 daily" 6. OTHER SYMPTOMS: "Do you have any other symptoms?" (e.g., chest pain, fever, cough, SOB, vomiting, diarrhea, bleeding, other areas of pain)     Mentioned SOB- states she sometimes experiences SOB upon exertion, denies SOB at rest "Feels different in my chest"- states it's not a pain, "hard to explain" Denies fever, denies cough, denies abdominal pain, denies nausea Feels sluggish  Protocols used: Weakness (Generalized) and Fatigue-A-AH

## 2024-02-01 NOTE — Progress Notes (Signed)
 Subjective:     Patient ID: Cindy Schwartz, female    DOB: 12-13-1933, 89 y.o.   MRN: [id number]  Chief Complaint  Patient presents with   Fatigue    Pt in office c/o generalized fatigue; pt states been having the fatigue for 10 days; states completed XiFaxan  Rx and since finishing medicine been feeling this way,     HPI Discussed the use of AI scribe software for clinical note transcription with the patient, who gave verbal consent to proceed.  History of Present Illness Cindy Schwartz is an 89 year old female who presents with fatigue and chest discomfort.  She experiences significant fatigue, particularly in the mornings, which makes it difficult to get out of bed. The fatigue improves slightly during the day but returns later. She attributes this fatigue to a recent course of Xifaxan . There is no associated muscle weakness, and she is able to stand without difficulty.(In review of notes, fatigue long time, but worsened since Xifaxan )  She describes a sensation of "something in her chest" .  Can be a rest and once, occurred with rushing to get the door. No shortness of breath, coughing, or chest pressure is present. She has experienced an irregular heartbeat in the past but is not currently experiencing palpitations.  She has noticed an unintentional weight loss of about three to three and a half pounds, which she finds concerning as she is not trying to lose weight. There are no reports of dark tarry stools or other gastrointestinal symptoms.  She has a history of coughing up small flecks of blood, for which she consulted an ear, nose, and throat specialist. The specialist attributed this to a broken spider vein, and she has not noticed any further bleeding since then.  She is currently taking Nexium  once a day for reflux.    Health Maintenance Due  Topic Date Due   Zoster Vaccines- Shingrix (1 of 2) 12/16/1954   MAMMOGRAM  01/14/2024    Past Medical History:  Diagnosis Date    Aneurysm of ascending aorta (HCC)    a. 4.2-4.3cm in 2014.   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   Insomnia    Irregular heart beat    Normal coronary arteries Cath - 09/2014   Osteoporosis    Raynaud's disease     Past Surgical History:  Procedure Laterality Date   ABDOMINAL HYSTERECTOMY  1979   APPENDECTOMY     BILATERAL SALPINGOOPHORECTOMY     BREAST LUMPECTOMY     COLONOSCOPY  02/2012   LEFT HEART CATHETERIZATION WITH CORONARY ANGIOGRAM N/A 10/17/2014   Procedure: LEFT HEART CATHETERIZATION WITH CORONARY ANGIOGRAM;  Surgeon: Millicent Ally, MD;  Location[facility] CATH LAB;  Service: Cardiovascular;  Laterality: N/A;   MASTECTOMY Right 2018   no chemo no radiation  no tamoxifen   ovarian ca s/p taj/bso  1979   SIMPLE MASTECTOMY WITH AXILLARY SENTINEL NODE BIOPSY Right 05/05/2017   Procedure: RIGHT TOTAL MASTECTOMY WITH RIGHT  AXILLARY SENTINEL NODE BIOPSY;  Surgeon: Enid Harry, MD;  Location: [facility] OR;  Service: General;  Laterality: Right;  PECTORAL BLOCK     Current Outpatient Medications:    Ascorbic Acid (VITAMIN C) 1000 MG tablet, Take 1,000 mg by mouth daily., Disp: , Rfl:    aspirin  EC (ASPIRIN  LOW DOSE) 81 MG tablet, TAKE 1 TABLET DAILY (SWALLOW WHOLE), Disp: 90 tablet, Rfl: 3   Calcium  Carbonate (CALCIUM  500 PO), Take by mouth., Disp: , Rfl:    Cholecalciferol 25 MCG  (1000 UT) capsule, Take by mouth., Disp: , Rfl:    COLLAGEN PO, Take by mouth., Disp: , Rfl:    esomeprazole  (NEXIUM ) 20 MG capsule, Take 1 capsule (20 mg total) by mouth daily., Disp: 90 capsule, Rfl: 3   MAGNESIUM  PO, Take 300 mg by mouth., Disp: , Rfl:    nitroGLYCERIN  (NITROSTAT ) 0.4 MG SL tablet, Place 1 tablet (0.4 mg total) under the tongue every 5 (five) minutes as needed for chest pain., Disp: 25 tablet, Rfl: 3   rosuvastatin  (CRESTOR ) 5 MG tablet, Take 1 tablet (5 mg total) by mouth daily. Take 1 tablet daily by mouth., Disp: 90 tablet, Rfl: 3   TART CHERRY PO, Take 1 Capful by mouth daily., Disp: , Rfl:    valACYclovir  (VALTREX ) 500 MG tablet, Take 1 tablet (500 mg total) by mouth 2 (two) times a week., Disp: 26 tablet, Rfl: 3  Allergies  Allergen Reactions   Chlorhexidine  Dermatitis   Prednisone  Other (See Comments)    Fatigue and depressed mood on 7 day 40mg  x 3 days, 20 mg x 4 days from Dr. Arlene Ben- intolerance  prednisone    Chlorhexidine  Gluconate Rash   ROS neg/noncontributory except as noted HPI/below      Objective:      BP 120/68 (BP Location: Left Arm, Patient Position: Sitting, Cuff Size: Normal)   Pulse 85   Temp 98.1 F (36.7 C) (Temporal)   Ht 5\' 5"  (1.651 m)   Wt 137 lb 12.8 oz (62.5 kg)   LMP  (LMP Unknown)   SpO2 98%   BMI 22.93 kg/m  Wt Readings from Last 3 Encounters:  02/01/24 137 lb 12.8 oz (62.5 kg)  01/14/24 138 lb 9.6 oz (62.9 kg)  12/03/23 138 lb 6 oz (62.8 kg)    Physical Exam   Gen: WDWN NAD HEENT: NCAT, conjunctiva not injected, sclera nonicteric NECK:  supple, no thyromegaly, no nodes, no carotid bruits CARDIAC: RRR, S1S2+, ?1/6 murmur.  LUNGS: CTAB. No wheezes ABDOMEN:  BS+, soft, NTND, No HSM, no masses EXT:  no edema MSK: no gross abnormalities.  NEURO: A&O x3.  CN II-XII intact.  PSYCH: normal mood. Good eye contact  EKG BBB-no acute change.  Same as 1 yr ago     Assessment & Plan:  Other fatigue -     CBC with  Differential/Platelet -     Comprehensive metabolic panel with GFR -     TSH -     DG Chest 2 View; Future -     Sedimentation rate  Hemoptysis -     DG Chest 2 View; Future  Chest pressure -     DG Chest 2 View; Future -     EKG 12-Lead  Assessment and Plan Assessment & Plan Chest discomfort   She experiences  intermittent chest discomfort described as tightness, possibly related to recent Xifaxan  use. Differential includes GERD, cardiac causes, and other systemic issues. No shortness of breath or palpitations. She is on Nexium  once daily for GERD. Increase Nexium  to twice daily for one week to manage potential GERD symptoms. Order chest x-ray and EKG to evaluate for underlying causes and assess cardiac function.  Coughing up blood   She has been coughing up small flecks of blood, previously evaluated by ENT who identified a broken spider vein as the cause. No further episodes have occurred. A chest x-ray is indicated to rule out other causes of hemoptysis.  Fatigue   She reports fatigue, particularly severe in the morning, possibly related to recent Xifaxan  use. No associated muscle weakness or difficulty standing. Differential includes medication side effects and other systemic causes. Order blood work to evaluate for underlying causes of fatigue.  Weight loss   She has experienced unintentional weight loss of approximately 3-3.5 pounds with no attempts to lose weight. Differential includes medication side effects and other systemic causes.    Return if symptoms worsen or fail to improve.  Ellsworth Haas, MD

## 2024-02-02 ENCOUNTER — Ambulatory Visit: Payer: Self-pay | Admitting: Family Medicine

## 2024-02-02 LAB — COMPREHENSIVE METABOLIC PANEL WITH GFR
ALT: 14 U/L (ref 0–35)
AST: 22 U/L (ref 0–37)
Albumin: 4.1 g/dL (ref 3.5–5.2)
Alkaline Phosphatase: 57 U/L (ref 39–117)
BUN: 18 mg/dL (ref 6–23)
CO2: 28 meq/L (ref 19–32)
Calcium: 9.6 mg/dL (ref 8.4–10.5)
Chloride: 99 meq/L (ref 96–112)
Creatinine, Ser: 0.67 mg/dL (ref 0.40–1.20)
GFR: 78.2 mL/min (ref >60.00)
Glucose, Bld: 96 mg/dL (ref 70–99)
Potassium: 3.8 meq/L (ref 3.5–5.1)
Sodium: 133 meq/L — ABNORMAL LOW (ref 135–145)
Total Bilirubin: 0.4 mg/dL (ref 0.2–1.2)
Total Protein: 7 g/dL (ref 6.0–8.3)

## 2024-02-02 LAB — CBC WITH DIFFERENTIAL/PLATELET
Basophils Absolute: 0.1 10*3/uL (ref 0.0–0.1)
Basophils Relative: 1 % (ref 0.0–3.0)
Eosinophils Absolute: 0.4 10*3/uL (ref 0.0–0.7)
Eosinophils Relative: 5.4 % — ABNORMAL HIGH (ref 0.0–5.0)
HCT: 38.3 % (ref 36.0–46.0)
Hemoglobin: 12.7 g/dL (ref 12.0–15.0)
Lymphocytes Relative: 32 % (ref 12.0–46.0)
Lymphs Abs: 2.2 10*3/uL (ref 0.7–4.0)
MCHC: 33.1 g/dL (ref 30.0–36.0)
MCV: 85.3 fl (ref 78.0–100.0)
Monocytes Absolute: 0.6 10*3/uL (ref 0.1–1.0)
Monocytes Relative: 8.2 % (ref 3.0–12.0)
Neutro Abs: 3.7 10*3/uL (ref 1.4–7.7)
Neutrophils Relative %: 53.4 % (ref 43.0–77.0)
Platelets: 234 10*3/uL (ref 150.0–400.0)
RBC: 4.49 Mil/uL (ref 3.87–5.11)
RDW: 14.8 % (ref 11.5–15.5)
WBC: 6.9 10*3/uL (ref 4.0–10.5)

## 2024-02-02 LAB — TSH: TSH: 1.76 u[IU]/mL (ref 0.35–5.50)

## 2024-02-02 LAB — SEDIMENTATION RATE: Sed Rate: 6 mm/h (ref 0–30)

## 2024-02-02 NOTE — Progress Notes (Signed)
 Labs are unremarkable.  Chest x-ray-nothing acute.  May have some interstitial lung disease-refer to pulmonary.  Does she want to see Cardiology as well?

## 2024-02-09 ENCOUNTER — Telehealth: Payer: Self-pay

## 2024-02-09 NOTE — Telephone Encounter (Signed)
 See below, Is this something that you have in your office?  Copied from CRM [id number]. Topic: Clinical - Lab/Test Results >> Feb 09, 2024  8:11 AM Cindy Schwartz wrote: Reason for CRM: Patient needs a disc of the CT scan for appointment with specialist. Callback number is [phone]

## 2024-02-09 NOTE — Telephone Encounter (Signed)
 Know we do not have that available-she would need to get that from [facility] imaging directly-give her the number to call and see if they can set that up for her

## 2024-02-10 NOTE — Telephone Encounter (Signed)
 Called and spoke with pt, pt was not needed a disc of a CT scan. She was wanting the results from her xray to take with her to her pulmonary appt tomorrow. However, I informed pt that [facility] will be able to see the results in [facility]. Pt voiced understanding.

## 2024-02-12 ENCOUNTER — Ambulatory Visit: Admitting: Internal Medicine

## 2024-02-24 ENCOUNTER — Ambulatory Visit: Admitting: Internal Medicine

## 2024-02-24 VITALS — BP 126/70 | HR 73

## 2024-02-24 DIAGNOSIS — R9389 Abnormal findings on diagnostic imaging of other specified body structures: Secondary | ICD-10-CM

## 2024-02-24 DIAGNOSIS — R042 Hemoptysis: Secondary | ICD-10-CM

## 2024-02-24 DIAGNOSIS — R0789 Other chest pain: Secondary | ICD-10-CM | POA: Diagnosis not present

## 2024-02-24 LAB — CBC WITH DIFFERENTIAL/PLATELET
Basophils Absolute: 0 10*3/uL (ref 0.0–0.1)
Basophils Relative: 0.7 % (ref 0.0–3.0)
Eosinophils Absolute: 0.2 10*3/uL (ref 0.0–0.7)
Eosinophils Relative: 4.6 % (ref 0.0–5.0)
HCT: 39.1 % (ref 36.0–46.0)
Hemoglobin: 13 g/dL (ref 12.0–15.0)
Lymphocytes Relative: 27.6 % (ref 12.0–46.0)
Lymphs Abs: 1.4 10*3/uL (ref 0.7–4.0)
MCHC: 33.2 g/dL (ref 30.0–36.0)
MCV: 83.1 fl (ref 78.0–100.0)
Monocytes Absolute: 0.6 10*3/uL (ref 0.1–1.0)
Monocytes Relative: 11 % (ref 3.0–12.0)
Neutro Abs: 2.9 10*3/uL (ref 1.4–7.7)
Neutrophils Relative %: 56.1 % (ref 43.0–77.0)
Platelets: 214 10*3/uL (ref 150.0–400.0)
RBC: 4.71 Mil/uL (ref 3.87–5.11)
RDW: 15.2 % (ref 11.5–15.5)
WBC: 5.2 10*3/uL (ref 4.0–10.5)

## 2024-02-24 LAB — NITRIC OXIDE: Nitric Oxide: 12

## 2024-02-24 NOTE — Progress Notes (Signed)
 OV 02/24/2024  Subjective:  Patient ID: Cindy Schwartz, female , DOB: 1933/10/01 , age 89 y.o. , MRN: [id number] , ADDRESS: [address] [place] [facility] [id number] PCP Almira Jaeger, MD Patient Care Team: Almira Jaeger, MD as PCP - General (Family Medicine) Loyde Rule, MD as PCP - Cardiology (Cardiology) Lanita Pitman, MD as Consulting Physician (Gastroenterology) Brigitte Canard, PA-C as Physician Assistant (Physician Assistant) Jerelene Monday Rema Care, MD as Consulting Physician (Allergy  and Immunology) Oris Birmingham, MD as Consulting Physician (Ophthalmology)  This Provider for this visit: Treatment Team:  Attending Provider: Maire Scot, MD    02/24/2024 -   Chief Complaint  Patient presents with   Consult    Pt reports hemoptysis a few days ago and reports feeling "heavy" in chest, pt denies cough     HPI Cindy Schwartz 89 y.o. -weight of late patient Mr. Peter  ROS k who died from IPF and lung cancer.  This was 4 years ago.  She now lives alone.  She has a daughter living in [place] .  Her son Barney Libman lives in [place].  She has another son Veryl Gottron who is now moved from [place] to be living [place].  She has 2 great grandkids and 1 great grandchild on the way.  She has another grandson Barney Libman who is now 49 lives in [place]  and has suffered from colon cancer and is undergoing radiation.  She is still grieving the loss of her husband.  But she is managing.  She lives alone but her sons live close by.  She is functional and she walks her dog in the neighborhood.  She reports in the last week of April 3 to 4 times she spat blood early in the morning.  The blood was dark.  She was worried about it it had some mucus.  And then she saw Dr. Donalee Fruits ENT who did the throat evaluation and felt "throat blood vessel was broken".  After that she says the chest is felt heavier particularly in the mornings and when she is supine.  She saw her primary care and had a  chest x-ray that I personally visualized.  The official report is that there is some infiltrates but I personally think it is just hyperinflation and normal.  However she did have a CT scan angiogram of the aorta a year ago and in that there is some right upper lobe nodular density from a year ago.  She does not have any baseline shortness of breath or wheezing other than this heaviness.  She is known to have acid reflux for a long time.  In 2019 blood allergy  testing and IgE was normal but recently used to fill serve at folder cells per cubic millimeter.  Her exam nitric oxide  test today is:  NROMAL at 12ppbc  Recent blood work is normal.  She was short of breath PFT     Latest Ref Rng & Units 06/21/2018    9:34 AM  PFT Results  FVC-Pre L 2.61   FVC-Predicted Pre % 97   FVC-Post L 2.54   FVC-Predicted Post % 94   Pre FEV1/FVC % % 73   Post FEV1/FCV % % 78   FEV1-Pre L 1.91   FEV1-Predicted Pre % 95   FEV1-Post L 1.97   DLCO uncorrected ml/min/mmHg 22.59   DLCO UNC% % 85   DLCO corrected ml/min/mmHg 22.46   DLCO COR %Predicted % 85   DLVA Predicted % 100   TLC  L 5.16   TLC % Predicted % 97   RV % Predicted % 108          SIT STAND TEST - goal 15 times   02/24/2024    O2 used ra   PRobe - finter or forehead finger   Number sit and stand completed - goal 15 15   Time taken to complete 1 min and 9 sec   Resting Pulse Ox/HR/Dyspnea  100% and 74/min and dyspnea of 7/10    Peak measures 100 % and 97/min and dyspnea of 4/10   Final Pulse Ox/HR 100% and 73/min and dyspnea of 2/10   Desaturated </= 88% no   Desaturated <= 3% points no   Got Tachycardic >/= 90/min yes   Miscellaneous comments Mild dysnea      LAB RESULTS last 96 hours No results found.       has a past medical history of Aneurysm of ascending aorta (HCC), Anxiety, Arthritis, Breast cancer (HCC) (2018), Cancer (HCC) (1980), Compression fracture of L1 lumbar vertebra (HCC), COMPRESSION FRACTURE,  THORACIC VERTEBRA (07/24/2010), Elevated BP (Transient 09/2014), Genetic testing (07/09/2017), GERD (gastroesophageal reflux disease), H/O bone density study, H/O colonoscopy, History of ovarian cancer, History of ovarian cancer (1979), Insomnia, Irregular heart beat, Normal coronary arteries (Cath - 09/2014), Osteoporosis, and Raynaud's disease.   reports that she has never smoked. She has never used smokeless tobacco.  Past Surgical History:  Procedure Laterality Date   ABDOMINAL HYSTERECTOMY  1979   APPENDECTOMY     BILATERAL SALPINGOOPHORECTOMY     BREAST LUMPECTOMY     COLONOSCOPY  02/2012   LEFT HEART CATHETERIZATION WITH CORONARY ANGIOGRAM N/A 10/17/2014   Procedure: LEFT HEART CATHETERIZATION WITH CORONARY ANGIOGRAM;  Surgeon: Millicent Ally, MD;  Location: [facility] CATH LAB;  Service: Cardiovascular;  Laterality: N/A;   MASTECTOMY Right 2018   no chemo no radiation no tamoxifen   ovarian ca s/p taj/bso  1979   SIMPLE MASTECTOMY WITH AXILLARY SENTINEL NODE BIOPSY Right 05/05/2017   Procedure: RIGHT TOTAL MASTECTOMY WITH RIGHT  AXILLARY SENTINEL NODE BIOPSY;  Surgeon: Enid Harry, MD;  Location: [facility] OR;  Service: General;  Laterality: Right;  PECTORAL BLOCK    Allergies  Allergen Reactions   Chlorhexidine  Dermatitis   Prednisone  Other (See Comments)    Fatigue and depressed mood on 7 day 40mg  x 3 days, 20 mg x 4 days from Dr. Arlene Ben- intolerance  prednisone    Chlorhexidine  Gluconate Rash    Immunization History  Administered Date(s) Administered   Fluad Quad(high Dose 65+) 06/01/2019, 06/11/2020, 06/26/2021, 07/09/2022   Influenza Split 06/26/2011, 07/07/2012   Influenza Whole 07/09/1999, 07/01/2007, 06/27/2008, 07/11/2009, 06/28/2010   Influenza, High Dose Seasonal PF 07/13/2013, 06/26/2015, 07/15/2017, 06/21/2018   Influenza,inj,Quad PF,6+ Mos 06/22/2014   Influenza,inj,quad, With Preservative 06/01/2019   Influenza-Unspecified 08/15/2016   PFIZER(Purple Top)SARS-COV-2  Vaccination 10/14/2019, 11/05/2019   Pneumococcal Polysaccharide-23 12/20/2012, 11/04/2018   Td 09/23/2003   Tdap 06/04/2020   Zoster, Live 01/19/2013    Family History  Problem Relation Age of Onset   Hip fracture Mother    Other Father        lived to 60   Esophageal cancer Cousin 4       mat first cousin related through uncle   Healthy Brother    Colon polyps Brother    Colon cancer Neg Hx    Pancreatic cancer Neg Hx    Stomach cancer Neg Hx      Current Outpatient Medications:  Ascorbic Acid (VITAMIN C) 1000 MG tablet, Take 1,000 mg by mouth daily., Disp: , Rfl:    aspirin  EC (ASPIRIN  LOW DOSE) 81 MG tablet, TAKE 1 TABLET DAILY (SWALLOW WHOLE), Disp: 90 tablet, Rfl: 3   Calcium  Carbonate (CALCIUM  500 PO), Take by mouth., Disp: , Rfl:    Cholecalciferol 25 MCG (1000 UT) capsule, Take by mouth., Disp: , Rfl:    COLLAGEN PO, Take by mouth., Disp: , Rfl:    esomeprazole  (NEXIUM ) 20 MG capsule, Take 1 capsule (20 mg total) by mouth daily., Disp: 90 capsule, Rfl: 3   MAGNESIUM  PO, Take 300 mg by mouth., Disp: , Rfl:    nitroGLYCERIN  (NITROSTAT ) 0.4 MG SL tablet, Place 1 tablet (0.4 mg total) under the tongue every 5 (five) minutes as needed for chest pain., Disp: 25 tablet, Rfl: 3   rosuvastatin  (CRESTOR ) 5 MG tablet, Take 1 tablet (5 mg total) by mouth daily. Take 1 tablet daily by mouth., Disp: 90 tablet, Rfl: 3   TART CHERRY PO, Take 1 Capful by mouth daily., Disp: , Rfl:    valACYclovir  (VALTREX ) 500 MG tablet, Take 1 tablet (500 mg total) by mouth 2 (two) times a week., Disp: 26 tablet, Rfl: 3      Objective:   Vitals:   02/24/24 0851  BP: 126/70  Pulse: 73  SpO2: 98%    Estimated body mass index is 22.93 kg/m as calculated from the following:   Height as of 02/01/24: 5\' 5"  (1.651 m).   Weight as of 02/01/24: 137 lb 12.8 oz (62.5 kg).  @WEIGHTCHANGE @  There were no vitals filed for this visit.   Physical Exam   General: No distress. Looks wel O2 at rest:  n Cane present: no Sitting in wheel chair: no Frail: no Obese: no Neuro: Alert and Oriented x 3. GCS 15. Speech normal Psych: Pleasant Resp:  Barrel Chest - no.  Wheeze - no, Crackles - no. ! SQUEAK x 1 RT LL, No overt respiratory distress CVS: Normal heart sounds. Murmurs - n Ext: Stigmata of Connective Tissue Disease - no HEENT: Normal upper airway. PEERL +. No post nasal drip        Assessment:       ICD-10-CM   1. Hemoptysis  R04.2 D-Dimer, Quantitative    CBC w/Diff    IgE    CT Chest High Resolution    2. Abnormal CT of the chest  R93.89 D-Dimer, Quantitative    CBC w/Diff    IgE    CT Chest High Resolution    3. Chest heaviness  R07.89 D-Dimer, Quantitative    CBC w/Diff    IgE    CT Chest High Resolution         Plan:     Patient Instructions     ICD-10-CM   1. Hemoptysis  R04.2     2. Abnormal CT of the chest  R93.89     3. Chest heaviness  R07.89        Cause not known CXR 2025 looks clear to me but CT chest May 2024 shows RUL nodulr inflammation  Plan  - check cbc with diff, blood IgE (all normal in 2019) to see if any new allergies  - get blood d-dimer  - get HRCT supine and prone  - if d-dimer abnoral will get CT angio rule out PE   FOLLOWUP  - wil call with results - if you do not hear from us  call us    FOLLOWUP  Return for will calll wth results.    SIGNATURE    Dr. Maire Scot, M.D., F.C.C.P,  Pulmonary and Critical Care Medicine Staff Physician, [facility] Director - Interstitial Lung Disease  Program  Pulmonary Fibrosis [facility] at [facility] [place], [place], [address]  Pager: [phone], If no answer or between  15:00h - 7:00h: call [phone] Telephone: [phone]  9:48 AM 02/24/2024

## 2024-02-24 NOTE — Patient Instructions (Addendum)
 ICD-10-CM   1. Hemoptysis  R04.2     2. Abnormal CT of the chest  R93.89     3. Chest heaviness  R07.89        Cause not known CXR 2025 looks clear to me but CT chest May 2024 shows RUL nodulr inflammation  Plan  - check cbc with diff, blood IgE (all normal in 2019) to see if any new allergies  - get blood d-dimer  - get HRCT supine and prone  - if d-dimer abnoral will get CT angio rule out PE   FOLLOWUP  - wil call with results - if you do not hear from us  call us 

## 2024-02-25 ENCOUNTER — Ambulatory Visit: Payer: Self-pay | Admitting: Internal Medicine

## 2024-02-25 LAB — D-DIMER, QUANTITATIVE: D-Dimer, Quant: 0.35 ug{FEU}/mL (ref <0.50)

## 2024-02-25 LAB — IGE: IgE (Immunoglobulin E), Serum: 50 kU/L (ref <=114)

## 2024-02-25 NOTE — Progress Notes (Signed)
Spoke with pt and notified of results per Dr. Ramaswamy. Pt verbalized understanding and denied any questions.  

## 2024-03-02 ENCOUNTER — Ambulatory Visit (HOSPITAL_BASED_OUTPATIENT_CLINIC_OR_DEPARTMENT_OTHER)
Admission: RE | Admit: 2024-03-02 | Discharge: 2024-03-02 | Disposition: A | Discharge status: Home / Self Care | Source: Ambulatory Visit | Attending: Internal Medicine | Admitting: Internal Medicine

## 2024-03-02 DIAGNOSIS — R0789 Other chest pain: Secondary | ICD-10-CM | POA: Diagnosis present

## 2024-03-02 DIAGNOSIS — R9389 Abnormal findings on diagnostic imaging of other specified body structures: Secondary | ICD-10-CM | POA: Insufficient documentation

## 2024-03-02 DIAGNOSIS — R042 Hemoptysis: Secondary | ICD-10-CM | POA: Insufficient documentation

## 2024-03-09 ENCOUNTER — Encounter: Payer: Self-pay | Admitting: Gastroenterology

## 2024-03-09 ENCOUNTER — Ambulatory Visit: Payer: Self-pay | Admitting: Gastroenterology

## 2024-03-09 ENCOUNTER — Ambulatory Visit (INDEPENDENT_AMBULATORY_CARE_PROVIDER_SITE_OTHER)
Admission: RE | Admit: 2024-03-09 | Discharge: 2024-03-09 | Disposition: A | Discharge status: Home / Self Care | Source: Ambulatory Visit | Attending: Gastroenterology | Admitting: Gastroenterology

## 2024-03-09 ENCOUNTER — Ambulatory Visit (INDEPENDENT_AMBULATORY_CARE_PROVIDER_SITE_OTHER): Admitting: Gastroenterology

## 2024-03-09 VITALS — BP 110/70 | HR 77 | Ht 65.0 in | Wt 135.1 lb

## 2024-03-09 DIAGNOSIS — R142 Eructation: Secondary | ICD-10-CM

## 2024-03-09 DIAGNOSIS — R109 Unspecified abdominal pain: Secondary | ICD-10-CM

## 2024-03-09 DIAGNOSIS — K219 Gastro-esophageal reflux disease without esophagitis: Secondary | ICD-10-CM

## 2024-03-09 DIAGNOSIS — R140 Abdominal distension (gaseous): Secondary | ICD-10-CM | POA: Diagnosis not present

## 2024-03-09 MED ORDER — LANSOPRAZOLE 30 MG PO CPDR: 30.0000 mg | DELAYED_RELEASE_CAPSULE | Freq: Every day | ORAL | 3 refills | Status: DC

## 2024-03-09 MED ORDER — PANTOPRAZOLE SODIUM 20 MG PO TBEC: 20.0000 mg | DELAYED_RELEASE_TABLET | Freq: Every day | ORAL | 2 refills | Status: DC

## 2024-03-09 NOTE — Progress Notes (Signed)
 Chief Complaint:follow-up, bloating, constipation Primary GI Doctor: (previously Dr. Aneita) Dr. Suzann  HPI:  This is an 89 year old female, previously known to Dr. Aneita with history of chronic constipation and GERD with eso dysmotility. Last seen in GI office by myself on 12/03/23.  She had diarrhea illness in Feb 2024. Campylobacter was diagnosed and treated.  Her reflux symptoms are controlled. Denies weight loss, melena, hematochezia, nausea, vomiting, dysphagia, chest pain. Small number of RBCs on UA. Na is slightly low, which has been noted previously.Blood work otherwise normal.   03/18/23 CT AP IMPRESSION: Colonic diverticulosis, without radiographic evidence of diverticulitis or other acute findings.  Large stool burden noted; recommend clinical correlation for possible constipation. Aortic Atherosclerosis (ICD10-I70.0). --Recommended patient start Miralax qd to bid (not prn) titrate dose for a complete BM daily   Interval History    Patient presents today with main complaint abdominal discomfort and bloating. We recently tested her for Sibo and was borderline so treated her with Xifaxan . She did not note any improvement.     She has history of constipation but denies issues with constipation. She states the gas builds up and she burps. She had slight nausea with episode this past weekend, but it resolved. She reports she has bowel movement every day. She reports the only thing that seems to help with abdominal massage and tea.      She has history of GERD and takes Nexium  20 mg in the morning and OTC Tums as needed. This does not help with the burping. Patient denies dysphagia. She enquires if she can switch to a different antiacid because she has been on Nexium  for several years. She does note back in early Jumar Greenstreet she had throat irritation and small amount of blood in sputum. She was seen by ENT who noted small vessel that Makayla Lanter have burst and no further workup needed. She states she  has not had any more issues since.     She also  had CT chest with pulmonary for what she described during her visit as heaviness  in her chest. Pending results.     Wt Readings from Last 3 Encounters:  03/09/24 135 lb 2 oz (61.3 kg)  02/01/24 137 lb 12.8 oz (62.5 kg)  01/14/24 138 lb 9.6 oz (62.9 kg)    Past Medical History:  Diagnosis Date   Aneurysm of ascending aorta (HCC)    a. 4.2-4.3cm in 2014.   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   Insomnia    Irregular heart beat    Normal coronary arteries Cath - 09/2014   Osteoporosis    Raynaud's disease     Past Surgical History:  Procedure Laterality Date   ABDOMINAL HYSTERECTOMY  1979   APPENDECTOMY     BILATERAL SALPINGOOPHORECTOMY  BREAST LUMPECTOMY     COLONOSCOPY  02/2012   LEFT HEART CATHETERIZATION WITH CORONARY ANGIOGRAM N/A 10/17/2014   Procedure: LEFT HEART CATHETERIZATION WITH CORONARY ANGIOGRAM;  Surgeon: Debby DELENA Sor, MD;  Location: [facility] CATH LAB;  Service: Cardiovascular;  Laterality: N/A;   MASTECTOMY Right 2018   no chemo no radiation no tamoxifen   ovarian ca s/p taj/bso  1979   SIMPLE MASTECTOMY WITH AXILLARY SENTINEL NODE BIOPSY Right 05/05/2017   Procedure: RIGHT TOTAL MASTECTOMY WITH RIGHT  AXILLARY SENTINEL NODE BIOPSY;  Surgeon: Ebbie Cough, MD;  Location: [facility] OR;  Service: General;  Laterality:  Right;  PECTORAL BLOCK    Current Outpatient Medications  Medication Sig Dispense Refill   Ascorbic Acid (VITAMIN C) 1000 MG tablet Take 1,000 mg by mouth daily.     aspirin  EC (ASPIRIN  LOW DOSE) 81 MG tablet TAKE 1 TABLET DAILY (SWALLOW WHOLE) 90 tablet 3   Calcium  Carbonate (CALCIUM  500 PO) Take by mouth.     Cholecalciferol 25 MCG (1000 UT) capsule Take by mouth.     COLLAGEN PO Take by mouth.     esomeprazole  (NEXIUM ) 20 MG capsule Take 1 capsule (20 mg total) by mouth daily. 90 capsule 3   MAGNESIUM  PO Take 300 mg by mouth.     nitroGLYCERIN  (NITROSTAT ) 0.4 MG SL tablet Place 1 tablet (0.4 mg total) under the tongue every 5 (five) minutes as needed for chest pain. 25 tablet 3   TART CHERRY PO Take 1 Capful by mouth daily.     valACYclovir  (VALTREX ) 500 MG tablet Take 1 tablet (500 mg total) by mouth 2 (two) times a week. 26 tablet 3   rosuvastatin  (CRESTOR ) 5 MG tablet Take 1 tablet (5 mg total) by mouth daily. Take 1 tablet daily by mouth. (Patient not taking: Reported on 03/09/2024) 90 tablet 3   No current facility-administered medications for this visit.    Allergies as of 03/09/2024 - Review Complete 03/09/2024  Allergen Reaction Noted   Chlorhexidine  Dermatitis 02/01/2024   Prednisone  Other (See Comments) 03/17/2022   Chlorhexidine  gluconate Rash 02/08/2016    Family History  Problem Relation Age of Onset   Hip fracture Mother    Other Father        lived to 83   Esophageal cancer Cousin 62       mat first cousin related through uncle   Healthy Brother    Colon polyps Brother    Colon cancer Neg Hx    Pancreatic cancer Neg Hx    Stomach cancer Neg Hx     Review of Systems:    Constitutional: No weight loss, fever, chills, weakness or fatigue HEENT: Eyes: No change in vision               Ears, Nose, Throat:  No change in hearing or congestion Skin: No rash or itching Cardiovascular: No chest pain, chest pressure or palpitations   Respiratory: No SOB or  cough Gastrointestinal: See HPI and otherwise negative Genitourinary: No dysuria or change in urinary frequency Neurological: No headache, dizziness or syncope Musculoskeletal: No new muscle or joint pain Hematologic: No bleeding or bruising Psychiatric: No history of depression or anxiety    Physical Exam:  Vital signs: BP 110/70   Pulse 77   Ht 5' 5 (1.651 m)   Wt 135 lb 2 oz (61.3 kg)   LMP  (LMP Unknown)   SpO2 97%   BMI 22.49 kg/m   Constitutional:   Pleasant  female  appears to be in NAD, Well developed, Well nourished, alert and cooperative Throat: Oral cavity and pharynx without inflammation, swelling or lesion.  Respiratory: Respirations even and unlabored. Lungs clear to auscultation bilaterally.   No wheezes, crackles, or rhonchi.  Cardiovascular: Normal S1, S2. Regular rate and rhythm. No peripheral edema, cyanosis or pallor.  Gastrointestinal:  Soft, nondistended, generalized tenderness with palpation. No rebound or guarding. Normal bowel sounds. No appreciable masses or hepatomegaly. Rectal:  Not performed.  Msk:  Symmetrical without gross deformities. Without edema, no deformity or joint abnormality.  Neurologic:  Alert and  oriented x4;  grossly normal neurologically.  Skin:   Dry and intact without significant lesions or rashes. Psychiatric: Oriented to person, place and time. Demonstrates good judgement and reason without abnormal affect or behaviors.  RELEVANT LABS AND IMAGING: CBC    Latest Ref Rng & Units 02/24/2024    9:51 AM 02/01/2024    4:21 PM 10/13/2023   10:44 AM  CBC  WBC 4.0 - 10.5 K/uL 5.2  6.9  5.7   Hemoglobin 12.0 - 15.0 g/dL 86.9  87.2  87.0   Hematocrit 36.0 - 46.0 % 39.1  38.3  39.2   Platelets 150.0 - 400.0 K/uL 214.0  234.0  229.0      CMP     Latest Ref Rng & Units 02/01/2024    4:21 PM 10/13/2023   10:44 AM 06/05/2023   10:09 AM  CMP  Glucose 70 - 99 mg/dL 96  89  81   BUN 6 - 23 mg/dL 18  12  16    Creatinine 0.40 - 1.20 mg/dL  9.32  9.45  9.34   Sodium 135 - 145 mEq/L 133  137  134   Potassium 3.5 - 5.1 mEq/L 3.8  4.0  4.2   Chloride 96 - 112 mEq/L 99  101  98   CO2 19 - 32 mEq/L 28  29  29    Calcium  8.4 - 10.5 mg/dL 9.6  9.7  9.6   Total Protein 6.0 - 8.3 g/dL 7.0  7.1  7.1   Total Bilirubin 0.2 - 1.2 mg/dL 0.4  0.5  0.6   Alkaline Phos 39 - 117 U/L 57  70  80   AST 0 - 37 U/L 22  23  21    ALT 0 - 35 U/L 14  15  14       Lab Results  Component Value Date   TSH 1.76 02/01/2024   12/31/23 Sibo test with borderline combined methane and hydrogen results 02/24/23 echo- Left ventricular ejection fraction, by estimation, is 60 to 65%.  12/08/19 EGD/colonoscopy with Dr. Donnald Colonoscopy- Impression: Diverticulosis of the sigmoid colon. Three diminutive polyps in the cecum.  Biopsied. Two diminutive poylps in the transverse colon. Biopsied. The distal rectum and anal verge are normal on the retroflexion view. No definite pathology seen to account for patients abdominal pain. Path: Final microscopic diagnosis Small intestine, duodenum biopsy Focal incidental lymphangiectasia, otherwise bowel mucosa with no significant Pathologic changes.  Negative for features of celiac disease. Giardia Organisms not present. Stomach anntrum bx reactive gastropathy. H pylori organisms not present. large intestine-cecum poylp Tubular adenomas (2) polypoid colonic mucosa with lymphoid aggregate. negative for dysplasia. Large intestine- transverse colon, polyp. Tubular adenoma. sessile serrated adenoma/poylp. Assessment: Encounter Diagnoses  Name Primary?   Bloating Yes   Burping    Abdominal discomfort    Gastroesophageal reflux disease, unspecified whether esophagitis present      89 year old female  patient who presents with chronic issues of burping, bloating, and abdominal discomfort. She has not had any resolution in symptoms with dietary changes or antiacids. We tested her for Sibo which was borderline and so we went  ahead and treated her with Xifaxan  with no improvement in her symptoms. She enquired about switching her PPI therapy, has been on Nexium  long time. Reasonable to try something else, will switch to Lansoprazole.  Will also order abdominal xray to rule out constipation given her persistent symptoms of bloating and abd discomfort.    She also enquires about upper GI endoscopy, although she is a healthy 89 year old I did discuss with her there is higher risk of complications. I would like to pursue what we discussed initially and have her finish her workup with pulmonary before scheduled endoscopy.  Last EGD 3/21 at outside facility was unremarkable.  Plan: - Order Abdominal xray KUB 2 view  -Switch patient from Nexium  to Lansoprazole 30 mg po daily -Continue GERD diet, no late meals -avoid trigger foods  Thank you for the courtesy of this consult. Please call me with any questions or concerns.   Micheline Markes, FNP-C [facility] 03/09/2024, 11:17 AM  I have reviewed the clinic note as outlined by Cathryne Beal, NP and agree with the assessment, plan and medical decision making.  Ms. Lao presents for follow-up of eructation, bloating, abdominal discomfort.  No improvement after trial of Xifaxan .  Although she denies constipation there is moderate stool burden on her KUB and could benefit from additional fiber or trial of a low-dose laxative or Smooth Move tea..  Agree with changing PPI, GERD diet.  Consider low FODMAP diet if bloating persists in the future despite management of GERD and constipation.  No alarm features to warrant endoscopic evaluation at this time.  Inocente Hausen, MD

## 2024-03-09 NOTE — Patient Instructions (Addendum)
 Lansoprazole 30 mg po daily  Your provider has requested that you go to the basement level for X-RAY work before leaving today. Press B on the elevator. The lab is located straight across as you exit the elevator.  _______________________________________________________  If your blood pressure at your visit was 140/90 or greater, please contact your primary care physician to follow up on this.  _______________________________________________________  If you are age 82 or older, your body mass index should be between 23-30. Your Body mass index is 22.49 kg/m. If this is out of the aforementioned range listed, please consider follow up with your Primary Care Provider.  If you are age 42 or younger, your body mass index should be between 19-25. Your Body mass index is 22.49 kg/m. If this is out of the aformentioned range listed, please consider follow up with your Primary Care Provider.   ________________________________________________________  The [place] [facility] providers would like to encourage you to use MYCHART to communicate with providers for non-urgent requests or questions.  Due to long hold times on the telephone, sending your provider a message by [facility] may be a faster and more efficient way to get a response.  Please allow 48 business hours for a response.  Please remember that this is for non-urgent requests.  _______________________________________________________   Thank you for trusting me with your gastrointestinal care. Deanna May, RNP

## 2024-03-11 NOTE — Telephone Encounter (Signed)
-----   Message from Devin Foerster May sent at 03/09/2024  4:56 PM EDT ----- Tyra Galley Can you please let the patient know that her x-ray did show that she had a moderate amount of stool in the colon which may be causing some of her symptoms.  I would like for her to take  daily  miralax otc the get the stool cleared out.  Edsel Grace, NP ----- Message ----- From: Interface, Rad Results In Sent: 03/09/2024   2:19 PM EDT To: Devin Foerster May, NP

## 2024-03-15 ENCOUNTER — Telehealth: Payer: Self-pay | Admitting: Internal Medicine

## 2024-03-15 NOTE — Telephone Encounter (Signed)
   Afomia Cauthorn = Let her know of the NORMAL.  INCLUDING THE CT SCAN.  THE CT SCAN PARTICULARLY HAS NO FIBROSIS OR NO CANCER OR PNEUMONIA PNEUMONIA.  MILD ABNORMALITIES THAT SHE CAN TALK TO PRIMARY CARE PHYSICIAN ARE HIGHLIGHTED BELOW    IMPRESSION: 1. No evidence of interstitial lung disease. 2. 4.2 cm ascending aortic aneurysm, stable. Recommend annual imaging followup by CTA or MRA. This recommendation follows 2010 ACCF/AHA/AATS/ACR/ASA/SCA/SCAI/SIR/STS/SVM Guidelines for the Diagnosis and Management of Patients with Thoracic Aortic Disease. Circulation. 2010; 121: Z733-z630. Aortic aneurysm NOS (ICD10-I71.9).  Slight aortic aneurysm should talk to primary care 3. Multiple small splenic artery aneurysms.-Splenic aneurysms to talk to primary care [last stress test was 20 years ago] 4. Aortic atherosclerosis (ICD10-I70.0). Coronary artery calcification.  Coronary artery calcifications to talk to primary care 5. Enlarged pulmonic trunk, indicative of pulmonary arterial hypertension. -I do not think this is significant     Electronically Signed   By: Newell Eke M.D.   On: 03/10/2024 16:04     Latest Reference Range & Units 02/24/24 09:51 02/24/24 10:18  Nitric Oxide    12  WBC 4.0 - 10.5 K/uL 5.2   RBC 3.87 - 5.11 Mil/uL 4.71   Hemoglobin 12.0 - 15.0 g/dL 86.9   HCT 63.9 - 53.9 % 39.1   MCV 78.0 - 100.0 fl 83.1   MCHC 30.0 - 36.0 g/dL 66.7   RDW 88.4 - 84.4 % 15.2   Platelets 150.0 - 400.0 K/uL 214.0   Neutrophils 43.0 - 77.0 % 56.1   Lymphocytes 12.0 - 46.0 % 27.6   Monocytes Relative 3.0 - 12.0 % 11.0   Eosinophil 0.0 - 5.0 % 4.6   Basophil 0.0 - 3.0 % 0.7   NEUT# 1.4 - 7.7 K/uL 2.9   Lymphs Abs 0.7 - 4.0 K/uL 1.4   Monocyte # 0.1 - 1.0 K/uL 0.6   Eosinophils Absolute 0.0 - 0.7 K/uL 0.2   Basophils Absolute 0.0 - 0.1 K/uL 0.0   D-Dimer, Quant <0.50 mcg/mL FEU 0.35   IgE (Immunoglobulin E), Serum <OR=114 kU/L 50

## 2024-03-16 ENCOUNTER — Encounter: Payer: Self-pay | Admitting: Podiatry

## 2024-03-16 ENCOUNTER — Ambulatory Visit (INDEPENDENT_AMBULATORY_CARE_PROVIDER_SITE_OTHER): Payer: Medicare Other | Admitting: Podiatry

## 2024-03-16 DIAGNOSIS — I7300 Raynaud's syndrome without gangrene: Secondary | ICD-10-CM | POA: Diagnosis not present

## 2024-03-16 DIAGNOSIS — Q828 Other specified congenital malformations of skin: Secondary | ICD-10-CM | POA: Diagnosis not present

## 2024-03-16 DIAGNOSIS — I739 Peripheral vascular disease, unspecified: Secondary | ICD-10-CM

## 2024-03-16 DIAGNOSIS — B351 Tinea unguium: Secondary | ICD-10-CM

## 2024-03-16 NOTE — Progress Notes (Signed)
  Subjective:  Patient ID: Cindy Schwartz, female    DOB: [birth year],  MRN: [id number]  Cindy Schwartz presents to clinic today for at risk foot care. Patient has h/o Raynaud's disease and painful porokeratotic lesions b/l feet. Pain prevent(s) comfortable ambulation. Aggravating factor is weightbearing with and without shoegear.  Chief Complaint  Patient presents with   RFC    RM#16 RFC Patient has no concerns at this time just routine foot care.   New problem(s): None.   PCP is [name], MD.  Allergies  Allergen Reactions   Chlorhexidine  Dermatitis   Prednisone  Other (See Comments)    Fatigue and depressed mood on 7 day 40mg  x 3 days, 20 mg x 4 days from Dr. Katrinka- intolerance  prednisone    Chlorhexidine  Gluconate Rash    Review of Systems: Negative except as noted in the HPI.  Objective: No changes noted in today's physical examination. There were no vitals filed for this visit. Cindy Schwartz is a pleasant 89 y.o. female WD, WN in NAD. AAO x 3.  Vascular Examination: Palpable pedal pulses. CFT immediate b/l. Pedal hair present. No edema. No pain with calf compression b/l. Skin temperature gradient WNL b/l. No varicosities noted. No cyanosis or clubbing noted.  Neurological Examination: Sensation grossly intact b/l with 10 gram monofilament. Vibratory sensation intact b/l.  Dermatological Examination: Pedal skin with normal turgor, texture and tone b/l. No open wounds nor interdigital macerations noted.   Toenails 1-5 b/l well maintained with adequate length. No erythema, no edema, no drainage, no fluctuance.  Porokeratotic lesion(s) submet head 2 left foot and submet head 2 right foot. No erythema, no edema, no drainage, no fluctuance.  Musculoskeletal Examination: Muscle strength 5/5 to b/l LE.  No pain, crepitus noted b/l. No gross pedal deformities. Patient ambulates independently without assistive aids.   Radiographs: None  Assessment/Plan: 1. Porokeratosis    2. Raynaud's disease without gangrene     -Consent given for treatment as described below: -Examined patient. -Patient to continue soft, supportive shoe gear daily. -Porokeratotic lesion(s) submet head 2 left foot and submet head 2 right foot pared and enucleated with sterile currette without incident. Total number of lesions debrided=2. -Patient/POA to call should there be question/concern in the interim.   Return in about 3 months (around 06/16/2024).  Delon LITTIE Merlin, DPM      [facility] LOCATION: [address] [place] [address]                   Office [phone]   [facility] LOCATION: [address] [place], [place] [address] Office [phone]

## 2024-03-18 NOTE — Telephone Encounter (Signed)
 Spoke with pt and notified of results per [facility]. Pt verbalized understanding and denied any questions. Copy to PCP.

## 2024-03-21 ENCOUNTER — Ambulatory Visit: Payer: PRIVATE HEALTH INSURANCE | Attending: Nurse Practitioner | Admitting: Nurse Practitioner

## 2024-03-21 ENCOUNTER — Encounter: Payer: Self-pay | Admitting: Nurse Practitioner

## 2024-03-21 VITALS — BP 118/68 | HR 68 | Ht 65.0 in | Wt 137.0 lb

## 2024-03-21 DIAGNOSIS — R002 Palpitations: Secondary | ICD-10-CM | POA: Diagnosis not present

## 2024-03-21 DIAGNOSIS — I728 Aneurysm of other specified arteries: Secondary | ICD-10-CM

## 2024-03-21 DIAGNOSIS — I2510 Atherosclerotic heart disease of native coronary artery without angina pectoris: Secondary | ICD-10-CM

## 2024-03-21 DIAGNOSIS — E785 Hyperlipidemia, unspecified: Secondary | ICD-10-CM

## 2024-03-21 DIAGNOSIS — I493 Ventricular premature depolarization: Secondary | ICD-10-CM | POA: Diagnosis not present

## 2024-03-21 DIAGNOSIS — I491 Atrial premature depolarization: Secondary | ICD-10-CM

## 2024-03-21 DIAGNOSIS — I712 Thoracic aortic aneurysm, without rupture, unspecified: Secondary | ICD-10-CM

## 2024-03-21 NOTE — Progress Notes (Addendum)
 Office Visit    Patient Name: Cindy Schwartz Date of Encounter: 03/21/2024  Primary Care Provider:  [name], Schwartz Primary Cardiologist:  Cindy Emmer, Schwartz  Chief Complaint    89 year old female with a history of coronary artery calcification on CT, palpitations, PACs, PVCs, thoracic aortic aneurysm, splenic artery aneurysm, hyperlipidemia, and breast cancer who presents for follow-up related to palpitations.  Past Medical History    Past Medical History:  Diagnosis Date   Aneurysm of ascending aorta (HCC)    a. 4.2-4.3cm in 2014.   Anxiety    Arthritis    Breast cancer (HCC) 2018   right mastectomy   Cancer (HCC) 1980   ovarian cancer   Compression fracture of L1 lumbar vertebra (HCC)    COMPRESSION FRACTURE, THORACIC VERTEBRA 07/24/2010   Qualifier: Diagnosis of  By: Cindy Schwartz    Elevated BP Transient 09/2014   Genetic testing 07/09/2017   Multi-Cancer panel (83 genes) @ [facility] - No pathogenic mutations detected   GERD (gastroesophageal reflux disease)    H/O bone density study    2015   H/O colonoscopy    History of ovarian cancer    November 1979   History of ovarian cancer 1979   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   Insomnia    Irregular heart beat    Normal coronary arteries Cath - 09/2014   Osteoporosis    Raynaud's disease    Past Surgical History:  Procedure Laterality Date   ABDOMINAL HYSTERECTOMY  1979   APPENDECTOMY     BILATERAL SALPINGOOPHORECTOMY     BREAST LUMPECTOMY     COLONOSCOPY  02/2012   LEFT HEART CATHETERIZATION WITH CORONARY ANGIOGRAM N/A 10/17/2014   Procedure: LEFT HEART CATHETERIZATION WITH CORONARY ANGIOGRAM;  Surgeon: Cindy DELENA Sor, Schwartz;  Location: [facility] CATH LAB;  Service: Cardiovascular;  Laterality: N/A;   MASTECTOMY Right 2018   no chemo no radiation no tamoxifen   ovarian ca s/p taj/bso  1979   SIMPLE MASTECTOMY WITH AXILLARY SENTINEL NODE BIOPSY Right 05/05/2017   Procedure: RIGHT TOTAL  MASTECTOMY WITH RIGHT  AXILLARY SENTINEL NODE BIOPSY;  Surgeon: Cindy Schwartz;  Location: [facility] OR;  Service: General;  Laterality: Right;  PECTORAL BLOCK    Allergies  Allergies  Allergen Reactions   Chlorhexidine  Dermatitis   Prednisone  Other (See Comments)    Fatigue and depressed mood on 7 day 40mg  x 3 days, 20 mg x 4 days from Cindy Schwartz- intolerance  prednisone    Chlorhexidine  Gluconate Rash     Labs/Other Studies Reviewed    The following studies were reviewed today:  Cardiac Studies & Procedures   ______________________________________________________________________________________________   STRESS TESTS  MYOCARDIAL PERFUSION IMAGING 09/11/2004   ECHOCARDIOGRAM  ECHOCARDIOGRAM COMPLETE 02/24/2023  Narrative ECHOCARDIOGRAM REPORT    Patient Name:   Cindy Schwartz   Date of Exam: 02/24/2023 Medical Rec #:  [id number]     Height:       65.0 in Accession #:    [id number]    Weight:       138.4 lb Date of Birth:  06-11-1934     BSA:          1.692 m Patient Age:    89 years      BP:           102/62 mmHg Patient Gender: F             HR:  72 bpm. Exam Location:  [address]  Procedure: 2D Echo, Cardiac Doppler and Color Doppler  Indications:    R01.1 Murmur  History:        Patient has prior history of Echocardiogram examinations, most recent 01/25/2018. Ascending aortic aneurysm, Arrythmias:PVC and PAC, Signs/Symptoms:Murmur and H/o breast cancer; Risk Factors:Palpitations.  Sonographer:    Cindy Schwartz Referring Phys: Cindy Schwartz  IMPRESSIONS   1. Left ventricular ejection fraction, by estimation, is 60 to 65%. The left ventricle has normal function. The left ventricle has no regional wall motion abnormalities. Left ventricular diastolic parameters were normal. 2. Right ventricular systolic function is normal. The right ventricular size is normal. There is normal pulmonary artery systolic pressure. 3. Left atrial size was  mildly dilated. 4. The mitral valve was not well visualized. No evidence of mitral valve regurgitation. 5. Aortic valve regurgitation is not visualized. Aortic valve sclerosis/calcification is present, without any evidence of aortic stenosis. 6. The inferior vena cava is normal in size with greater than 50% respiratory variability, suggesting right atrial pressure of 3 mmHg.  FINDINGS Left Ventricle: Left ventricular ejection fraction, by estimation, is 60 to 65%. The left ventricle has normal function. The left ventricle has no regional wall motion abnormalities. The left ventricular internal cavity size was normal in size. There is no left ventricular hypertrophy. Left ventricular diastolic parameters were normal.  Right Ventricle: The right ventricular size is normal. Right ventricular systolic function is normal. There is normal pulmonary artery systolic pressure. The tricuspid regurgitant velocity is 2.42 m/s, and with an assumed right atrial pressure of 3 mmHg, the estimated right ventricular systolic pressure is 26.4 mmHg.  Left Atrium: Left atrial size was mildly dilated.  Right Atrium: Right atrial size was normal in size.  Pericardium: There is no evidence of pericardial effusion.  Mitral Valve: The mitral valve was not well visualized. No evidence of mitral valve regurgitation.  Tricuspid Valve: Tricuspid valve regurgitation is mild.  Aortic Valve: Aortic valve regurgitation is not visualized. Aortic valve sclerosis/calcification is present, without any evidence of aortic stenosis.  Pulmonic Valve: Pulmonic valve regurgitation is not visualized.  Aorta: The aortic root and ascending aorta are structurally normal, with no evidence of dilitation.  Venous: The inferior vena cava is normal in size with greater than 50% respiratory variability, suggesting right atrial pressure of 3 mmHg.  IAS/Shunts: No atrial level shunt detected by color flow Doppler.   LEFT VENTRICLE PLAX  2D LVIDd:         3.60 cm   Diastology LVIDs:         2.40 cm   LV Schwartz' medial:    9.03 cm/s LV PW:         0.90 cm   LV Schwartz/Schwartz' medial:  11.0 LV IVS:        0.90 cm   LV Schwartz' lateral:   8.49 cm/s LVOT diam:     2.20 cm   LV Schwartz/Schwartz' lateral: 11.7 LV SV:         85 LV SV Index:   50 LVOT Area:     3.80 cm   RIGHT VENTRICLE             IVC RV S prime:     12.90 cm/s  IVC diam: 1.30 cm TAPSE (M-mode): 1.5 cm RVSP:           26.4 mmHg  LEFT ATRIUM  Index        RIGHT ATRIUM           Index LA diam:        1.95 cm 1.15 cm/m   RA Pressure: 3.00 mmHg LA Vol (A2C):   44.3 ml 26.19 ml/m  RA Area:     12.60 cm LA Vol (A4C):   49.6 ml 29.32 ml/m  RA Volume:   33.00 ml  19.51 ml/m LA Biplane Vol: 48.3 ml 28.55 ml/m AORTIC VALVE LVOT Vmax:   105.00 cm/s LVOT Vmean:  67.500 cm/s LVOT VTI:    0.223 m  AORTA Ao Root diam: 3.25 cm Ao Asc diam:  3.75 cm  MITRAL VALVE               TRICUSPID VALVE MV Area (PHT): 3.07 cm    TR Peak grad:   23.4 mmHg MV Decel Time: 247 msec    TR Vmax:        242.00 cm/s MV Schwartz velocity: 99.60 cm/s  Estimated RAP:  3.00 mmHg MV A velocity: 90.10 cm/s  RVSP:           26.4 mmHg MV Schwartz/A ratio:  1.11 SHUNTS Systemic VTI:  0.22 m Systemic Diam: 2.20 cm  Ronal Ross Electronically signed by Ronal Ross Signature Date/Time: 02/24/2023/12:25:04 PM    Final          ______________________________________________________________________________________________     Recent Labs: 02/01/2024: ALT 14; BUN 18; Creatinine, Ser 0.67; Potassium 3.8; Sodium 133; TSH 1.76 02/24/2024: Hemoglobin 13.0; Platelets 214.0  Recent Lipid Panel    Component Value Date/Time   CHOL 129 10/13/2023 1044   TRIG 41.0 10/13/2023 1044   HDL 62.00 10/13/2023 1044   CHOLHDL 2 10/13/2023 1044   VLDL 8.2 10/13/2023 1044   LDLCALC 59 10/13/2023 1044   LDLCALC 93 06/11/2020 0932   LDLDIRECT 64.0 01/08/2023 0945    History of Present Illness   89 year old female with the  above past medical history including coronary artery calcification on CT, palpitations, PACs, PVCs, thoracic aortic aneurysm, splenic artery aneurysm, hyperlipidemia, and breast cancer.   Cardiac catheterization in 2016 showed normal coronary arteries.  Holter monitor in 2017 in the setting of palpitations revealed predominantly normal sinus rhythm, PACs, PVCs, short runs of SVT.  She has a history of thoracic aortic aneurysm.  She was last seen in the office on 03/19/2023 and was doing well from a cardiac standpoint. Most recent echocardiogram in 02/2023 showed EF 60 to 65%, normal LV function, no RWMA, normal RV systolic function, aortic valve sclerosis without evidence of aortic valve stenosis.  She was recently evaluated by pulmonology in the setting of hemoptysis. CT chest in 02/2024 was stable with stable 4.2 cm ascending aortic aneurysm.  She presents today for follow-up. Since her last visit she has been stable from a cardiac standpoint. She has had no further bleeding.  She reports mild chest congestion with mucus production, she will occasionally feel a heaviness in her chest in this setting. She denies any exertional chest pain, denies palpitations, dizziness, dyspnea, edema, PND, orthopnea, weight gain. She does report some generalized anxiety with regards to aging.  She does not want to lose her independence. She hopes to travel to [place] sometime in the next year. Overall, she reports feeling well.  Home Medications    Current Outpatient Medications  Medication Sig Dispense Refill   aspirin  EC (ASPIRIN  LOW DOSE) 81 MG tablet TAKE 1 TABLET DAILY (SWALLOW WHOLE) 90 tablet 3  Calcium  Carbonate (CALCIUM  500 PO) Take by mouth.     Cholecalciferol 25 MCG (1000 UT) capsule Take by mouth. (Patient taking differently: Take by mouth. Takes in the winter months)     COLLAGEN PO Take by mouth.     lansoprazole  (PREVACID ) 30 MG capsule Take 1 capsule (30 mg total) by mouth daily at 12 noon. 90 capsule 3    MAGNESIUM  PO Take 300 mg by mouth.     nitroGLYCERIN  (NITROSTAT ) 0.4 MG SL tablet Place 1 tablet (0.4 mg total) under the tongue every 5 (five) minutes as needed for chest pain. 25 tablet 3   rosuvastatin  (CRESTOR ) 5 MG tablet Take 1 tablet (5 mg total) by mouth daily. Take 1 tablet daily by mouth. 90 tablet 3   valACYclovir  (VALTREX ) 500 MG tablet Take 1 tablet (500 mg total) by mouth 2 (two) times a week. 26 tablet 3   Ascorbic Acid (VITAMIN C) 1000 MG tablet Take 1,000 mg by mouth daily. (Patient not taking: Reported on 03/21/2024)     TART CHERRY PO Take 1 Capful by mouth daily. (Patient not taking: Reported on 03/21/2024)     No current facility-administered medications for this visit.     Review of Systems    She denies chest pain, palpitations, dyspnea, pnd, orthopnea, n, v, dizziness, syncope, edema, weight gain, or early satiety. All other systems reviewed and are otherwise negative except as noted above.   Physical Exam    VS:  BP 118/68 (BP Location: Left Arm, Patient Position: Sitting, Cuff Size: Normal)   Pulse 68   Ht 5' 5 (1.651 m)   Wt 137 lb (62.1 kg)   LMP  (LMP Unknown)   SpO2 98%   BMI 22.80 kg/m  GEN: Well nourished, well developed, in no acute distress. HEENT: normal. Neck: Supple, no JVD, carotid bruits, or masses. Cardiac: RRR, no murmurs, rubs, or gallops. No clubbing, cyanosis, edema.  Radials/DP/PT 2+ and equal bilaterally.  Respiratory:  Respirations regular and unlabored, clear to auscultation bilaterally. GI: Soft, nontender, nondistended, BS + x 4. MS: no deformity or atrophy. Skin: warm and dry, no rash. Neuro:  Strength and sensation are intact. Psych: Normal affect.  Accessory Clinical Findings    ECG personally reviewed by me today -    - no EKG in office today.   Lab Results  Component Value Date   WBC 5.2 02/24/2024   HGB 13.0 02/24/2024   HCT 39.1 02/24/2024   MCV 83.1 02/24/2024   PLT 214.0 02/24/2024   Lab Results  Component  Value Date   CREATININE 0.67 02/01/2024   BUN 18 02/01/2024   NA 133 (L) 02/01/2024   K 3.8 02/01/2024   CL 99 02/01/2024   CO2 28 02/01/2024   Lab Results  Component Value Date   ALT 14 02/01/2024   AST 22 02/01/2024   ALKPHOS 57 02/01/2024   BILITOT 0.4 02/01/2024   Lab Results  Component Value Date   CHOL 129 10/13/2023   HDL 62.00 10/13/2023   LDLCALC 59 10/13/2023   LDLDIRECT 64.0 01/08/2023   TRIG 41.0 10/13/2023   CHOLHDL 2 10/13/2023    No results found for: HGBA1C  Assessment & Plan    1. Palpitations/PACs/PVCs: Holter monitor in 2017  revealed predominantly normal sinus rhythm, PACs, PVCs, short runs of SVT. She denies any recent palpitations.  2. Thoracic aortic aneurysm/splenic artery aneurysm: Recent CT of the chest in 02/2024 showed stable 4.2 cm ascending aortic aneurysm.  There was  also evidence of several small splenic artery aneurysms measuring less than 7 mm. Will likely continue to follow with annual imaging.  I will reach out to Dr. Nishan to ensure that no additional testing is needed at this time.  ADDENDUM 03/21/2024: Per Dr. Delford, no additional testing is needed at this time.  3. Coronary artery calcification seen on CT/hyperlipidemia: Most recent echocardiogram in 02/2023 showed EF 60 to 65%, normal LV function, no RWMA, normal RV systolic function, aortic valve sclerosis without evidence of aortic valve stenosis. Recent CT chest showed evidence of coronary artery calcification.  Stable with no anginal symptoms.  No indication for ischemic evaluation.  LDL was 59 in 09/2023.  Continue ASA, Crestor .  4. Disposition: Follow-up in 1 year, sooner if needed.       Damien JAYSON Braver, NP 03/21/2024, 8:42 AM

## 2024-03-21 NOTE — Patient Instructions (Signed)
  Follow-Up: At [facility], you and your health needs are our priority.  As part of our continuing mission to provide you with exceptional heart care, our providers are all part of one team.  This team includes your primary Cardiologist (physician) and Advanced Practice Providers or APPs (Physician Assistants and Nurse Practitioners) who all work together to provide you with the care you need, when you need it.  Your next appointment:   1 year(s)  Provider:   [name], MD    We recommend signing up for the patient portal called MyChart.  Sign up information is provided on this After Visit Summary.  MyChart is used to connect with patients for Virtual Visits (Telemedicine).  Patients are able to view lab/test results, encounter notes, upcoming appointments, etc.  Non-urgent messages can be sent to your provider as well.   To learn more about what you can do with MyChart, go to [email or web address].

## 2024-04-04 ENCOUNTER — Encounter: Payer: Self-pay | Admitting: Family Medicine

## 2024-04-04 ENCOUNTER — Ambulatory Visit (INDEPENDENT_AMBULATORY_CARE_PROVIDER_SITE_OTHER): Admitting: Family Medicine

## 2024-04-04 ENCOUNTER — Ambulatory Visit: Payer: Self-pay | Admitting: Family Medicine

## 2024-04-04 VITALS — BP 120/80 | HR 69 | Temp 97.2°F | Ht 65.0 in | Wt 131.6 lb

## 2024-04-04 DIAGNOSIS — R634 Abnormal weight loss: Secondary | ICD-10-CM | POA: Diagnosis not present

## 2024-04-04 DIAGNOSIS — R1030 Lower abdominal pain, unspecified: Secondary | ICD-10-CM

## 2024-04-04 DIAGNOSIS — E785 Hyperlipidemia, unspecified: Secondary | ICD-10-CM

## 2024-04-04 DIAGNOSIS — I712 Thoracic aortic aneurysm, without rupture, unspecified: Secondary | ICD-10-CM

## 2024-04-04 DIAGNOSIS — K5900 Constipation, unspecified: Secondary | ICD-10-CM | POA: Diagnosis not present

## 2024-04-04 DIAGNOSIS — K219 Gastro-esophageal reflux disease without esophagitis: Secondary | ICD-10-CM

## 2024-04-04 LAB — COMPREHENSIVE METABOLIC PANEL WITH GFR
ALT: 18 U/L (ref 0–35)
AST: 21 U/L (ref 0–37)
Albumin: 4.3 g/dL (ref 3.5–5.2)
Alkaline Phosphatase: 69 U/L (ref 39–117)
BUN: 13 mg/dL (ref 6–23)
CO2: 30 meq/L (ref 19–32)
Calcium: 10 mg/dL (ref 8.4–10.5)
Chloride: 97 meq/L (ref 96–112)
Creatinine, Ser: 0.67 mg/dL (ref 0.40–1.20)
GFR: 78.1 mL/min (ref >60.00)
Glucose, Bld: 86 mg/dL (ref 70–99)
Potassium: 4.4 meq/L (ref 3.5–5.1)
Sodium: 133 meq/L — ABNORMAL LOW (ref 135–145)
Total Bilirubin: 0.6 mg/dL (ref 0.2–1.2)
Total Protein: 7.2 g/dL (ref 6.0–8.3)

## 2024-04-04 LAB — CBC WITH DIFFERENTIAL/PLATELET
Basophils Absolute: 0 K/uL (ref 0.0–0.1)
Basophils Relative: 1 % (ref 0.0–3.0)
Eosinophils Absolute: 0.2 K/uL (ref 0.0–0.7)
Eosinophils Relative: 3.9 % (ref 0.0–5.0)
HCT: 39.6 % (ref 36.0–46.0)
Hemoglobin: 13 g/dL (ref 12.0–15.0)
Lymphocytes Relative: 33.8 % (ref 12.0–46.0)
Lymphs Abs: 1.6 K/uL (ref 0.7–4.0)
MCHC: 33 g/dL (ref 30.0–36.0)
MCV: 83.7 fl (ref 78.0–100.0)
Monocytes Absolute: 0.4 K/uL (ref 0.1–1.0)
Monocytes Relative: 9.5 % (ref 3.0–12.0)
Neutro Abs: 2.4 K/uL (ref 1.4–7.7)
Neutrophils Relative %: 51.8 % (ref 43.0–77.0)
Platelets: 224 K/uL (ref 150.0–400.0)
RBC: 4.73 Mil/uL (ref 3.87–5.11)
RDW: 14.8 % (ref 11.5–15.5)
WBC: 4.7 K/uL (ref 4.0–10.5)

## 2024-04-04 NOTE — Patient Instructions (Addendum)
 Trial 2 kiwi a day to see if helps with constipation Since you still have bentyl /dicyclomine  before bed since that's the most troublesome time- and even though it can cause constipation hopefully the kiwi will conunteract that. You can continue miralax as well Go ahead and schedule with gastroenterology at next available with Dr. Mcgreal If the visit with them is more than 6 weeks out- update me in a month and we can look at updating CT of abdomen in the meantime- plus we can discuss at next visit  Continue lansoprazole   Recommended follow up: Return for next already scheduled visit or sooner if needed.

## 2024-04-04 NOTE — Progress Notes (Signed)
 Phone [phone] In person visit   Subjective:   Cindy Schwartz is a 89 y.o. year old very pleasant female patient who presents for/with See problem oriented charting Chief Complaint  Patient presents with   discuss cardiology    Pt would like to discuss results from cardiologist   Knee Pain   Back Pain    Past Medical History-  Patient Active Problem List   Diagnosis Date Noted   Lumbar stenosis 01/08/2023    Priority: High   History of cerebellar stroke 10/05/2020    Priority: High   Memory loss 10/05/2020    Priority: High   history of Breast cancer of lower-outer quadrant of right female breast  01/16/2016    Priority: High   Lower abdominal pain 06/28/2010    Priority: High   Aneurysm of thoracic aorta (HCC) 05/06/2007    Priority: High   History of ovarian cancer 09/22/1977    Priority: High   COPD (chronic obstructive pulmonary disease) (HCC) 03/17/2022    Priority: Medium    History of adenomatous polyp of colon 12/30/2019    Priority: Medium    Mild hyperlipidemia 06/01/2019    Priority: Medium    Chronic cough 01/21/2018    Priority: Medium    Normal coronary arteries 10/17/2014    Priority: Medium    Hx of herpes simplex infection 12/18/2011    Priority: Medium    Esophageal reflux 05/06/2007    Priority: Medium    Osteoporosis 03/18/2007    Priority: Medium    Pain due to onychomycosis of toenails of both feet 12/25/2020    Priority: Low   Chronic idiopathic constipation 03/01/2019    Priority: Low   Genetic testing 07/09/2017    Priority: Low   Palpitations 11/13/2014    Priority: Low   Varicose veins of bilateral lower extremities with other complications 03/09/2012    Priority: Low   Generalized anxiety disorder 10/13/2008    Priority: Low   Irritable bowel syndrome 10/13/2008    Priority: Low   Lung nodule 11/25/2007    Priority: Low   Raynaud's syndrome 07/01/2007    Priority: Low   Allergic rhinitis 05/06/2007    Priority: Low    Osteoarthritis of carpometacarpal (CMC) joints of both thumbs 09/26/2021    Priority: 1.   Osteoarthritis of both hands 08/29/2020    Priority: 1.   Bilateral carpal tunnel syndrome 08/29/2020    Priority: 1.   Trigger thumb of left hand 08/29/2020    Priority: 1.   Degenerative arthritis of knee, bilateral 04/08/2019    Priority: 1.   Low back pain 06/28/2010    Priority: 1.   DEGENERATIVE DISC DISEASE, CERVICAL SPINE 05/06/2007    Priority: 1.   Bloating 07/31/2022   Left sided abdominal pain 07/31/2022   Belching 07/31/2022    Medications- reviewed and updated Current Outpatient Medications  Medication Sig Dispense Refill   Ascorbic Acid (VITAMIN C) 1000 MG tablet Take 1,000 mg by mouth daily.     aspirin  EC (ASPIRIN  LOW DOSE) 81 MG tablet TAKE 1 TABLET DAILY (SWALLOW WHOLE) 90 tablet 3   Calcium  Carbonate (CALCIUM  500 PO) Take by mouth.     COLLAGEN PO Take by mouth.     lansoprazole  (PREVACID ) 30 MG capsule Take 1 capsule (30 mg total) by mouth daily at 12 noon. 90 capsule 3   MAGNESIUM  PO Take 300 mg by mouth.     nitroGLYCERIN  (NITROSTAT ) 0.4 MG SL tablet Place 1 tablet (0.4  mg total) under the tongue every 5 (five) minutes as needed for chest pain. 25 tablet 3   rosuvastatin  (CRESTOR ) 5 MG tablet Take 1 tablet (5 mg total) by mouth daily. Take 1 tablet daily by mouth. 90 tablet 3   TART CHERRY PO Take 1 Capful by mouth daily.     valACYclovir  (VALTREX ) 500 MG tablet Take 1 tablet (500 mg total) by mouth 2 (two) times a week. 26 tablet 3   Cholecalciferol 25 MCG (1000 UT) capsule Take by mouth. (Patient taking differently: Take by mouth. Takes in the winter months)     No current facility-administered medications for this visit.     Objective:  BP 120/80   Pulse 69   Temp (!) 97.2 F (36.2 C)   Ht 5' 5 (1.651 m)   Wt 131 lb 9.6 oz (59.7 kg)   LMP  (LMP Unknown)   SpO2 96%   BMI 21.90 kg/m  Gen: NAD, resting comfortably CV: RRR no murmurs rubs or  gallops Lungs: CTAB no crackles, wheeze, rhonchi Abdomen: soft/nontender with no pain on exam today/nondistended/normal bowel sounds. No rebound or guarding.  Ext: trace edema Skin: warm, dry    Assessment and Plan   # Lower abdominal pain/constipation/unintentional weight loss S: Patient has had a pretty extensive workup for fatigue and upper chest discomfort-has seen pulmonology and cardiology and had CT scan of her chest.  D-dimer was not elevated.  Chest symptoms seem to be better but continues to have lower abdominal pain-though we had notes of this at least back to 4 years ago.  -Of note she does have a history of ovarian cancer at age 20-reassuring CT scan of abdomen pelvis March 18, 2023 for left-sided abdominal pain but she was having large stool burden associated with this. - Most recently had abdominal films 03/09/2024 which still showed moderate fecal retention throughout the colon consistent with constipation.  Patient has been taking MiraLAX regularly for this and is having good regular bowel movements but persistent pain. -She has also been treated in the past with Xifaxan  for borderline SIBO test but reports her symptoms actually worsened after this -Reports some unintentional weight loss-down 8 pounds from January with 6 of these pounds being in the last 2 months.  Her thyroid  was normal at that time -Has seen GI and was switched to lansoprazole  and has had less bloating since that time -She has some dicyclomine  Dr. Aneita gave her but has never tried this-was worried about side effects  She has an upcoming potential trip to [place] for a pilgrimage with her church and would even visit her home country in [place] but she is not sure she feels up to it  She reports she may have been taking too much turmeric and had read it can affect the liver-she would like to recheck that today A/P: Patient with lower abdominal pain issues over the last year and has seen GI multiple times-also has  ongoing PT issues.  History of ovarian cancer though resected at age 38 and we discussed possibly doing a CT scan of her abdomen but we have a follow-up in a month and we opted to monitor until that time-we are going to try natural solution of 2 kiwis a day to see if this makes any difference and see if we can stabilize weight loss-if not we will proceed with scan.  I also want her to schedule follow-up with GI with persistent symptoms-patient would like consideration of colonoscopy and/or endoscopy if  not improving which I certainly understand As she is concerned about side effects of dicyclomine  but may trial before bedtime-discussed risk of constipation but hoping this can be counted by kiwis plus ongoing MiraLAX   #History of cerebellar stroke with some balance and memory issues/hyperlipidemia S: Patient complained of issues with memory loss-on MRI of brain 09/17/2020-Remote right cerebellar infarct.   Medication: Rosuvastatin  5 mg daily, aspirin  81 mg Memory and balance issues could be associated with prior stroke-no recent worsening Lab Results  Component Value Date   CHOL 129 10/13/2023   HDL 62.00 10/13/2023   LDLCALC 59 10/13/2023   LDLDIRECT 64.0 01/08/2023   TRIG 41.0 10/13/2023   CHOLHDL 2 10/13/2023   A/P: Lipids have been very well-controlled in light of prior stroke-continue current medications.  Has also had some coronary artery calcifications and goal LDL would be under 70 anyway  #lumbar stenosis- noted MRI September 2023 with DrRONITA Joane. Also saw Dr. Georgina orthopedics 10/17/22.  Most recently -gabapentin  100 mg twice daily per scalp issues- but is willing to try 01/08/23 - now off in 2025 -She reports some worsening back pain and plans follow-up with Dr. Charls discussed there could be some referred pain from her back into her abdomen-also sometimes constipation can cause some back pain  #%- Aneurysm of thoracic aorta and splenic artery less than 7 mm  S: Gets MRI/MRI with  Dr. Lucas every other year-most recently March 02 2024 - blood pressure goal less than 130/80-looks great today -Patient with normal coronary arteries in the past in 2016 but still follows with Dr. Nishan every 6 months- palpitations with PVCs/PACs in the past on Holter monitor with short runs of SVT in past as well -Has seen cardiology she has had some upper chest discomfort but that is much improved A/P: Aneurysm of thoracic aorta thankfully stable and splenic artery aneurysms noted-continue scanning at least annually   #% Esophageal reflux/history of chronic cough- has seen Dr. Geronimo in the past S: Compliant with lansoprazole  30 mg daily  -Previously Nexium  20mg  every day - in past plus pepcid  per Dr. Aneita in 2023 A/P: This seems to be well-controlled-not having as much upper chest discomfort so could have been associated but still having a lot of lower abdominal pain as above   Recommended follow up: Return for next already scheduled visit or sooner if needed. Future Appointments  Date Time Provider Department Center  05/10/2024  8:20 AM [name], MD [facility]  06/29/2024 10:45 AM Gaynel Delon CROME, DPM [facility]  10/06/2024  9:20 AM [facility] ANNUAL WELLNESS VISIT 1 [facility]    Lab/Order associations:   ICD-10-CM   1. Lower abdominal pain  R10.30 Comprehensive metabolic panel with GFR    CBC with Differential/Platelet    2. Constipation, unspecified constipation type  K59.00     3. Unintentional weight loss  R63.4     4. Thoracic aortic aneurysm without rupture, unspecified part (HCC)  I71.20     5. Gastroesophageal reflux disease without esophagitis  K21.9     6. Mild hyperlipidemia  E78.5       No orders of the defined types were placed in this encounter.   Return precautions advised.  Garnette Katrinka, MD

## 2024-04-11 ENCOUNTER — Ambulatory Visit: Payer: Medicare Other | Admitting: Family Medicine

## 2024-04-19 ENCOUNTER — Ambulatory Visit: Payer: Self-pay

## 2024-04-19 NOTE — Telephone Encounter (Signed)
 FYI Only or Action Required?: Action required by provider: clinical question for provider.  Patient was last seen in primary care on 04/04/2024 by [name], MD.  Called Nurse Triage reporting Hand Problem.  Symptoms began several weeks ago.  Interventions attempted: OTC medications: advil .  Symptoms are: gradually worsening.  Triage Disposition: See HCP Within 4 Hours (Or PCP Triage)  Patient/caregiver understands and will follow disposition?: No, wishes to speak with PCPCopied from CRM #[id number]. Topic: Clinical - Red Word Triage >> Apr 19, 2024  9:29 AM Cindy Schwartz wrote: Red Word that prompted transfer to Nurse Triage: Patient called in wanted to know if she could get a medication for her hands are puffy and hurting and he back is hurting as well . In a lot of pain Reason for Disposition  [1] SEVERE pain (e.g., excruciating, unable to use hand at all) AND [2] not improved after 2 hours of pain medicine  Answer Assessment - Initial Assessment Questions 1. ONSET: When did the pain start?    2 weeks 2. LOCATION: Where is the pain located?     Both pans 3. PAIN: How bad is the pain? (Scale 1-10; or mild, moderate, severe)     8 4. WORK OR EXERCISE: Has there been any recent work or exercise that involved this part (i.e., hand or wrist) of the body?     na 5. CAUSE: What do you think is causing the pain?     Not sure 6. AGGRAVATING FACTORS: What makes the pain worse? (e.g., using computer)     movement 7. OTHER SYMPTOMS: Do you have any other symptoms? (e.g., fever, neck pain, numbness or tingling, rash, swelling)     Joint pain in  back and legs   Pt has been dealing with puffy hands for long while and getting worse. Hands hurt worse while using. Legs are stiff. Pt is taking advil for pain. Pt is sleepy more.  Pt is asking if inflammatory medication can be called in without appt. No appt made at this time. Pt is asking to leave voicemail, with plan,  if  she doesn't answer phone.Please advise  Protocols used: Hand Pain-A-AH

## 2024-04-19 NOTE — Telephone Encounter (Signed)
 Please see message and advise

## 2024-04-20 NOTE — Telephone Encounter (Signed)
 She has seen Dr. Joane before- could she get back into sports medicine while I'm out office this week to reevaluate her condition?

## 2024-04-20 NOTE — Telephone Encounter (Signed)
 Called and spoke with pt and below message given, she will reach out them.

## 2024-05-03 NOTE — Progress Notes (Signed)
 Ben Nicasio Barlowe D.CLEMENTEEN [facility] [address] [place] [address] Phone: [phone]   Assessment and Plan:     1. Chronic bilateral low back pain with bilateral sciatica 2. Spinal stenosis of lumbar region with neurogenic claudication  -Chronic with exacerbation, subsequent visit - Continued years of low back pain with several months of worsening back pain and worsening radicular symptoms, left > Right.  Consistent with flare of significant degenerative changes in lumbar spine as seen on prior lumbar MRI - Lumbar MRI 06/16/22 shows multifactorial severe spinal canal stenosis with cauda equina nerve root impingement at L3-L4, moderate left neural foraminal stenosis, impingement of left L5 nerve root at L4-L5, facet arthropathy at L5-S1 with potential irritation of S1 nerve root bilaterally, angled chronic compression deformities of T12 and L1 - Patient wishes to proceed with conservative therapy and is not interested in epidural CSI at this time - Start meloxicam  7.5 mg daily x2 weeks.  If still having pain after 2 weeks, complete 3rd-week of NSAID. May use remaining NSAID as needed once daily for pain control.  Do not to use additional over-the-counter NSAIDs (ibuprofen, naproxen, Advil, Aleve, etc.) while taking prescription NSAIDs.  May use Tylenol  [phone] mg 2 to 3 times a day for breakthrough pain.  -Start HEP and physical therapy for low back  15 additional minutes spent for educating Therapeutic Home Exercise Program.  This included exercises focusing on stretching, strengthening, with focus on eccentric aspects.   Long term goals include an improvement in range of motion, strength, endurance as well as avoiding reinjury. Patient's frequency would include in 1-2 times a day, 3-5 times a week for a duration of 6-12 weeks. Proper technique shown and discussed handout in great detail with ATC.  All questions were discussed and answered.    Pertinent  previous records reviewed include lumbar MRI 05/2522  Follow Up: 6 to 8 weeks for reevaluation.  If no significant improvement, would consider epidural CSI   Subjective:   I, Cindy Schwartz, am serving as a Neurosurgeon for Doctor Morene Mace  Chief Complaint: back pain   HPI:   05/04/2024 Patient is a 89 year old female with back pain for at least 2 years. Patient states that she has pain in lower back with B radiating pain and tingling down both legs L>R. Patient uses Advil and stretches to help relieve her pain.    Relevant Historical Information: COPD  Additional pertinent review of systems negative.   Current Outpatient Medications:    Ascorbic Acid (VITAMIN C) 1000 MG tablet, Take 1,000 mg by mouth daily., Disp: , Rfl:    aspirin  EC (ASPIRIN  LOW DOSE) 81 MG tablet, TAKE 1 TABLET DAILY (SWALLOW WHOLE), Disp: 90 tablet, Rfl: 3   Calcium  Carbonate (CALCIUM  500 PO), Take by mouth., Disp: , Rfl:    Cholecalciferol 25 MCG (1000 UT) capsule, Take by mouth. (Patient taking differently: Take by mouth. Takes in the winter months), Disp: , Rfl:    COLLAGEN PO, Take by mouth., Disp: , Rfl:    lansoprazole  (PREVACID ) 30 MG capsule, Take 1 capsule (30 mg total) by mouth daily at 12 noon., Disp: 90 capsule, Rfl: 3   MAGNESIUM  PO, Take 300 mg by mouth., Disp: , Rfl:    meloxicam  (MOBIC ) 7.5 MG tablet, Take 1 tablet (7.5 mg total) by mouth daily., Disp: 30 tablet, Rfl: 0   nitroGLYCERIN  (NITROSTAT ) 0.4 MG SL tablet, Place 1 tablet (0.4 mg total) under the tongue every 5 (  five) minutes as needed for chest pain., Disp: 25 tablet, Rfl: 3   rosuvastatin  (CRESTOR ) 5 MG tablet, Take 1 tablet (5 mg total) by mouth daily. Take 1 tablet daily by mouth., Disp: 90 tablet, Rfl: 3   TART CHERRY PO, Take 1 Capful by mouth daily., Disp: , Rfl:    valACYclovir  (VALTREX ) 500 MG tablet, Take 1 tablet (500 mg total) by mouth 2 (two) times a week., Disp: 26 tablet, Rfl: 3   Objective:     Vitals:   05/04/24 0812   BP: 108/64  Weight: 134 lb (60.8 kg)  Height: 5' 5 (1.651 m)      Body mass index is 22.3 kg/m.    Physical Exam:    Gen: Appears well, nad, nontoxic and pleasant Psych: Alert and oriented, appropriate mood and affect Neuro: Decreased sensation over distal lateral leg on left compared to right.  Otherwise sensation intact, strength is 5/5 in upper and lower extremities, muscle tone wnl Skin: no susupicious lesions or rashes  Back - Normal skin, Spine with normal alignment and no deformity.   No tenderness to vertebral process palpation.   Paraspinous muscles are not tender and without spasm NTTP gluteal musculature Straight leg raise positive left Trendelenberg positive left Piriformis Test negative Gait normal    Electronically signed by:  Odis Mace D.[facility] 8:43 AM 05/04/24

## 2024-05-04 ENCOUNTER — Ambulatory Visit: Admitting: Sports Medicine

## 2024-05-04 VITALS — BP 108/64 | Ht 65.0 in | Wt 134.0 lb

## 2024-05-04 DIAGNOSIS — G8929 Other chronic pain: Secondary | ICD-10-CM | POA: Diagnosis not present

## 2024-05-04 DIAGNOSIS — M48062 Spinal stenosis, lumbar region with neurogenic claudication: Secondary | ICD-10-CM | POA: Diagnosis not present

## 2024-05-04 DIAGNOSIS — M5442 Lumbago with sciatica, left side: Secondary | ICD-10-CM | POA: Diagnosis not present

## 2024-05-04 DIAGNOSIS — M5441 Lumbago with sciatica, right side: Secondary | ICD-10-CM

## 2024-05-04 MED ORDER — MELOXICAM 7.5 MG PO TABS: 7.5000 mg | ORAL_TABLET | Freq: Every day | ORAL | 0 refills | Status: AC

## 2024-05-04 NOTE — Patient Instructions (Addendum)
 Lower back HEP - Start meloxicam  7.5 mg daily x2 weeks.  If still having pain after 2 weeks, complete 3rd-week of NSAID. May use remaining NSAID as needed once daily for pain control.  Do not to use additional over-the-counter NSAIDs (ibuprofen, naproxen, Advil, Aleve, etc.) while taking prescription NSAIDs.  May use Tylenol  [phone] mg 2 to 3 times a day for breakthrough pain. PT referral [facility] See me again in 6-8 weeks

## 2024-05-10 ENCOUNTER — Encounter: Payer: Self-pay | Admitting: Family Medicine

## 2024-05-10 ENCOUNTER — Ambulatory Visit (INDEPENDENT_AMBULATORY_CARE_PROVIDER_SITE_OTHER): Payer: Medicare Other | Admitting: Family Medicine

## 2024-05-10 VITALS — BP 98/68 | HR 68 | Temp 97.5°F | Ht 65.0 in | Wt 134.4 lb

## 2024-05-10 DIAGNOSIS — R1030 Lower abdominal pain, unspecified: Secondary | ICD-10-CM | POA: Diagnosis not present

## 2024-05-10 DIAGNOSIS — K5900 Constipation, unspecified: Secondary | ICD-10-CM

## 2024-05-10 DIAGNOSIS — K219 Gastro-esophageal reflux disease without esophagitis: Secondary | ICD-10-CM | POA: Diagnosis not present

## 2024-05-10 DIAGNOSIS — E785 Hyperlipidemia, unspecified: Secondary | ICD-10-CM | POA: Diagnosis not present

## 2024-05-10 DIAGNOSIS — I712 Thoracic aortic aneurysm, without rupture, unspecified: Secondary | ICD-10-CM

## 2024-05-10 NOTE — Patient Instructions (Addendum)
 Glad you are doing better from gastroenterology perspective. Hoping the back/legs continue to improve- glad you are going to do physical therapy   Recommended follow up: Return in about 6 months (around 11/10/2024) for physical or sooner if needed.Schedule b4 you leave.

## 2024-05-10 NOTE — Progress Notes (Signed)
 Phone [phone] In person visit   Subjective:   Cindy Schwartz is a 89 y.o. year old very pleasant female patient who presents for/with See problem oriented charting Chief Complaint  Patient presents with   Hyperlipidemia   Medical Management of Chronic Issues    Pt states her lags and feet feel tired and heavy   Past Medical History-  Patient Active Problem List   Diagnosis Date Noted   Lumbar stenosis 01/08/2023    Priority: High   History of cerebellar stroke 10/05/2020    Priority: High   Memory loss 10/05/2020    Priority: High   history of Breast cancer of lower-outer quadrant of right female breast  01/16/2016    Priority: High   Lower abdominal pain 06/28/2010    Priority: High   Aneurysm of thoracic aorta (HCC) 05/06/2007    Priority: High   History of ovarian cancer 09/22/1977    Priority: High   COPD (chronic obstructive pulmonary disease) (HCC) 03/17/2022    Priority: Medium    History of adenomatous polyp of colon 12/30/2019    Priority: Medium    Mild hyperlipidemia 06/01/2019    Priority: Medium    Chronic cough 01/21/2018    Priority: Medium    Normal coronary arteries 10/17/2014    Priority: Medium    Hx of herpes simplex infection 12/18/2011    Priority: Medium    Esophageal reflux 05/06/2007    Priority: Medium    Osteoporosis 03/18/2007    Priority: Medium    Pain due to onychomycosis of toenails of both feet 12/25/2020    Priority: Low   Chronic idiopathic constipation 03/01/2019    Priority: Low   Genetic testing 07/09/2017    Priority: Low   Palpitations 11/13/2014    Priority: Low   Varicose veins of bilateral lower extremities with other complications 03/09/2012    Priority: Low   Generalized anxiety disorder 10/13/2008    Priority: Low   Irritable bowel syndrome 10/13/2008    Priority: Low   Lung nodule 11/25/2007    Priority: Low   Raynaud's syndrome 07/01/2007    Priority: Low   Allergic rhinitis 05/06/2007    Priority: Low    Osteoarthritis of carpometacarpal (CMC) joints of both thumbs 09/26/2021    Priority: 1.   Osteoarthritis of both hands 08/29/2020    Priority: 1.   Bilateral carpal tunnel syndrome 08/29/2020    Priority: 1.   Trigger thumb of left hand 08/29/2020    Priority: 1.   Degenerative arthritis of knee, bilateral 04/08/2019    Priority: 1.   Low back pain 06/28/2010    Priority: 1.   DEGENERATIVE DISC DISEASE, CERVICAL SPINE 05/06/2007    Priority: 1.   Bloating 07/31/2022   Left sided abdominal pain 07/31/2022   Belching 07/31/2022    Medications- reviewed and updated Current Outpatient Medications  Medication Sig Dispense Refill   Ascorbic Acid (VITAMIN C) 1000 MG tablet Take 1,000 mg by mouth daily.     aspirin  EC (ASPIRIN  LOW DOSE) 81 MG tablet TAKE 1 TABLET DAILY (SWALLOW WHOLE) 90 tablet 3   Calcium  Carbonate (CALCIUM  500 PO) Take by mouth.     Cholecalciferol 25 MCG (1000 UT) capsule Take by mouth. (Patient taking differently: Take by mouth. Takes in the winter months)     COLLAGEN PO Take by mouth.     lansoprazole  (PREVACID ) 30 MG capsule Take 1 capsule (30 mg total) by mouth daily at 12 noon. 90 capsule 3  MAGNESIUM  PO Take 300 mg by mouth.     meloxicam  (MOBIC ) 7.5 MG tablet Take 1 tablet (7.5 mg total) by mouth daily. 30 tablet 0   nitroGLYCERIN  (NITROSTAT ) 0.4 MG SL tablet Place 1 tablet (0.4 mg total) under the tongue every 5 (five) minutes as needed for chest pain. 25 tablet 3   rosuvastatin  (CRESTOR ) 5 MG tablet Take 1 tablet (5 mg total) by mouth daily. Take 1 tablet daily by mouth. 90 tablet 3   valACYclovir  (VALTREX ) 500 MG tablet Take 1 tablet (500 mg total) by mouth 2 (two) times a week. 26 tablet 3   No current facility-administered medications for this visit.     Objective:  BP 98/68 (BP Location: Left Arm, Patient Position: Sitting, Cuff Size: Normal)   Pulse 68   Temp (!) 97.5 F (36.4 C) (Temporal)   Ht 5' 5 (1.651 m)   Wt 134 lb 6.4 oz (61 kg)    LMP  (LMP Unknown)   SpO2 96%   BMI 22.37 kg/m  Gen: NAD, resting comfortably, appears younger than stated age CV: RRR no murmurs rubs or gallops Lungs: CTAB no crackles, wheeze, rhonchi Ext: no edema Skin: warm, dry     Assessment and Plan   #social update- traveling to [place] and close to her home country in November! 12 day journey- slightly more sad as by herself- with her husband   # Constipation/lower abdominal pain S: -Prior lower abdominal pain/constipation/unintentional weight loss at visit 04/04/2024.  Thankfully weight is up 3 pounds from that time.  Of note she has had some intermittent issues with lower abdominal pain back at least 4 years plus had seen GI within a year at that time with reassuring CT scan of abdomen pelvis March 18, 2023-did have large stool burden at that time though. -With some constipation issues we discussed to keep these per day-she did not report trying that today - reports 95% better on probiotics- went back on this instead of the kiwis -also made some tweaks to diet through grandson recommendations that have been helpful - We also wondered if she could have some referred pain from her back into her abdomen A/P: Patient reports her constipation and lower abdominal pain is about 95% better with probiotics and tweaking her diet-encouraged her to continue these changes as they have been so helpful   # Heaviness of breath S: Denies shortness of breath-feels like she can walk without issues per mile.  She does feel sometimes like she has heavy breathing or hard to get a deep breath but not exertional.  A/P:   She did have some potential pulmonary arterial hypertension on most recent CT chest high-resolution and does report snoring.  The CT itself did not show any cause for her shortness of breath-we discussed possible underlying sleep apnea especially with her prior fatigue but she reports that is better-offered pulmonary referral for further evaluation but she  declines for now.  She feels like her energy has increased from last visit.  She has also gained weight with changes in diet which she has been happy about   #History of cerebellar stroke with some balance and memory issues/hyperlipidemia S: Patient complained of issues with memory loss-on MRI of brain 09/17/2020-Remote right cerebellar infarct.  We started aspirin  81 mg as well as rosuvastatin  5 mg daily with LDL goal less than 70  Memory and balance issues could be associated with prior stroke Lab Results  Component Value Date   CHOL 129 10/13/2023  HDL 62.00 10/13/2023   LDLCALC 59 10/13/2023   LDLDIRECT 64.0 01/08/2023   TRIG 41.0 10/13/2023   CHOLHDL 2 10/13/2023    A/P: No worsening of memory or balance issues reported.  On appropriate treatment post cerebellar stroke with aspirin  and statin.  Lipids at goal with LDL under 70 on rosuvastatin  5 mg daily-continue current medication  #lumbar stenosis- noted MRI September 2023 with DrRONITA Joane. Also saw Dr. Georgina orthopedics 10/17/22.  Most recently saw Dr. Leonce and trial of meloxicam  S: Patient feels like her legs and feet feel tired and heavy.  No pain with walking.  She does have known lumbar stenosis based on MRI September 2023 and has also seen Dr. Georgina orthopedics 10/17/2022. -meloxicam  7.5 mg for 5 days through sports medicine Dr. Leonce and plans for 2 week trial -also plans on physical therapy for next month- wants to do from home A/P: Patient wanted to mention lumbar stenosis symptoms but already has plan in place and seems to be improving.  I agree with short course of meloxicam  as well as physical therapy.  We discussed risks of meloxicam  with cardiovascular risk in particular that she may want to limit her usage to just 2 weeks after we discussed instead of 4 weeks  #%- Aneurysm of thoracic aorta and splenic artery less than 7 mm  S: Gets MRI/MRI or CTA with Dr. Lucas every other year-most recently seen March 02 2024-appears next echo due 2026 A/P: Aneurysm has been stable-blood pressure well-controlled-continue current medication   #% Esophageal reflux/history of chronic cough- has seen Dr. Geronimo in the past S: Compliant with lansoprazole  30 mg daily  A/P: Reports good control-continue current medication   #Osteoporosis S: Patient on calcium  and vitamin D .  Took Fosamax  for years.  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  -No significant change on bone density November 2024 A/P: Osteoporosis noted-we discussed option of Prolia but she declines-prefers to minimize medicines  Recommended follow up: Return in about 6 months (around 11/10/2024) for physical or sooner if needed.Schedule b4 you leave. Future Appointments  Date Time Provider Department Center  05/24/2024  9:30 AM Johnston Delon LITTIE ALMETA [facility]  06/13/2024  8:30 AM McGreal, Inocente HERO, MD [facility]  06/15/2024 10:00 AM Leonce Katz, DO [facility] None  06/29/2024 10:45 AM Gaynel Delon LITTIE, DPM [facility]  10/06/2024  9:20 AM [facility] ANNUAL WELLNESS VISIT 1 [facility]  11/28/2024  9:00 AM [name], MD [facility]    Lab/Order associations:   ICD-10-CM   1. Lower abdominal pain  R10.30     2. Constipation, unspecified constipation type  K59.00     3. Mild hyperlipidemia  E78.5     4. Gastroesophageal reflux disease without esophagitis  K21.9     5. Thoracic aortic aneurysm without rupture, unspecified part (HCC)  I71.20       No orders of the defined types were placed in this encounter.   Return precautions advised.  Garnette Katrinka, MD

## 2024-05-13 ENCOUNTER — Ambulatory Visit (INDEPENDENT_AMBULATORY_CARE_PROVIDER_SITE_OTHER): Admitting: Podiatry

## 2024-05-13 ENCOUNTER — Encounter: Payer: Self-pay | Admitting: Podiatry

## 2024-05-13 VITALS — Ht 65.0 in | Wt 134.0 lb

## 2024-05-13 DIAGNOSIS — M79675 Pain in left toe(s): Secondary | ICD-10-CM | POA: Diagnosis not present

## 2024-05-13 DIAGNOSIS — L84 Corns and callosities: Secondary | ICD-10-CM | POA: Diagnosis not present

## 2024-05-13 DIAGNOSIS — M79674 Pain in right toe(s): Secondary | ICD-10-CM | POA: Diagnosis not present

## 2024-05-13 DIAGNOSIS — B351 Tinea unguium: Secondary | ICD-10-CM | POA: Diagnosis not present

## 2024-05-13 NOTE — Patient Instructions (Signed)
 Scrub the callus on the end of your toe with white vinegar daily using a toothbrush.  This make it easier for file down using a nail file or emery board.  You can also soak your foot in warm Epsom salts twice a day for 15 to 20 minutes and put Neosporin on the edge of the skin to help soften this area up if the area is getting painful and very irritated.

## 2024-05-13 NOTE — Progress Notes (Signed)
 Chief Complaint  Patient presents with   Ingrown Toenail    Had ingrown procedure previously R medial great toe nail and L lateral Great toe nail are sore. No redness.  Been a few months. Non diabetic no anti coag    HPI: 89 y.o. female presents today with some concern for painful hallux toenails right greater than left.  She typically comes in for routine footcare.  Currently notices some pain to the borders mainly the right medial and left lateral border which is relieved with trimming the nail.  She is considering ingrown toenail procedure but is unsure if she wants to proceed.  She does report Raynaud's disease.  Past Medical History:  Diagnosis Date   Aneurysm of ascending aorta (HCC)    a. 4.2-4.3cm in 2014.   Anxiety    Arthritis    Breast cancer (HCC) 2018   right mastectomy   Cancer (HCC) 1980   ovarian cancer   Compression fracture of L1 lumbar vertebra (HCC)    COMPRESSION FRACTURE, THORACIC VERTEBRA 07/24/2010   Qualifier: Diagnosis of  By: [name], [name]    Elevated BP Transient 09/2014   Genetic testing 07/09/2017   Multi-Cancer panel (83 genes) @ [facility] - No pathogenic mutations detected   GERD (gastroesophageal reflux disease)    H/O bone density study    2015   H/O colonoscopy    History of ovarian cancer    November 1979   History of ovarian cancer 1979   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   Insomnia    Irregular heart beat    Normal coronary arteries Cath - 09/2014   Osteoporosis    Raynaud's disease     Past Surgical History:  Procedure Laterality Date   ABDOMINAL HYSTERECTOMY  1979   APPENDECTOMY     BILATERAL SALPINGOOPHORECTOMY     BREAST LUMPECTOMY     COLONOSCOPY  02/2012   LEFT HEART CATHETERIZATION WITH CORONARY ANGIOGRAM N/A 10/17/2014   Procedure: LEFT HEART CATHETERIZATION WITH CORONARY ANGIOGRAM;  Surgeon: Debby DELENA Sor, MD;  Location: [facility] CATH LAB;  Service: Cardiovascular;  Laterality: N/A;    MASTECTOMY Right 2018   no chemo no radiation no tamoxifen   ovarian ca s/p taj/bso  1979   SIMPLE MASTECTOMY WITH AXILLARY SENTINEL NODE BIOPSY Right 05/05/2017   Procedure: RIGHT TOTAL MASTECTOMY WITH RIGHT  AXILLARY SENTINEL NODE BIOPSY;  Surgeon: Ebbie Cough, MD;  Location: [facility] OR;  Service: General;  Laterality: Right;  PECTORAL BLOCK    Allergies  Allergen Reactions   Chlorhexidine  Dermatitis   Prednisone  Other (See Comments)    Fatigue and depressed mood on 7 day 40mg  x 3 days, 20 mg x 4 days from Dr. Katrinka- intolerance  prednisone   Fatigue and depressed mood on 7 day 40mg  x 3 days, 20 mg x 4 days from Dr. Katrinka- intolerance  prednisone     prednisone    Chlorhexidine  Gluconate Rash    ROS    Physical Exam: There were no vitals filed for this visit.  General: The patient is alert and oriented x3 in no acute distress.  Dermatology: Skin is warm, dry and supple bilateral lower extremities. Interspaces are clear of maceration and debris.  Nailplates overall maintained.  Some mild tenderness on palpation of the right hallux medial border with some hyperkeratosis to the nail fold and to a lesser extent left lateral border.  Vascular: Palpable pedal pulses bilaterally. Capillary refill within normal limits.  No appreciable  edema.  No erythema or calor.  Telangiectasias present.  Neurological: Light touch sensation grossly intact bilateral feet.   Musculoskeletal Exam: Muscle strength 5/5 for all major muscles.  No pain or crepitus noted.  No gross pedal deformities.  Assessment/Plan of Care: 1. Pain due to onychomycosis of toenails of both feet   2. Callus of foot      No orders of the defined types were placed in this encounter.  None  Discussed clinical findings with patient today.  Today did perform slant back nail trims of the affected nail borders right medial and left lateral first toenails.  Did pare down associated callus as a courtesy without incident  using 15 blade.  Did offer PNA procedure for her for this as it does cause her problems recurrently.  She is unsure about this and would like some time to think about it.  Follow-up in 1 month for possible PNA procedure and RFC   Eligha Kmetz L. Lamount MAUL, AACFAS [facility]     [address] [place], [place] [address]                Office [phone]  Fax [phone]

## 2024-05-23 NOTE — Therapy (Unsigned)
 OUTPATIENT PHYSICAL THERAPY THORACOLUMBAR EVALUATION   Patient Name: Cindy Schwartz MRN: [id number] DOB:1934-05-10, 89 y.o., female Today's Date: 05/24/2024  END OF SESSION:  PT End of Session - 05/24/24 0939     Visit Number 1    Number of Visits 12    Date for PT Re-Evaluation 07/19/24   6-8 weeks   Authorization Type [employer] Medicare, Generic commercial    PT Start Time 0930    PT Stop Time 1023    PT Time Calculation (min) 53 min    Behavior During Therapy WFL for tasks assessed/performed          Past Medical History:  Diagnosis Date   Aneurysm of ascending aorta (HCC)    a. 4.2-4.3cm in 2014.   Anxiety    Arthritis    Breast cancer (HCC) 2018   right mastectomy   Cancer (HCC) 1980   ovarian cancer   Compression fracture of L1 lumbar vertebra (HCC)    COMPRESSION FRACTURE, THORACIC VERTEBRA 07/24/2010   Qualifier: Diagnosis of  By: [name], [name]    Elevated BP Transient 09/2014   Genetic testing 07/09/2017   Multi-Cancer panel (83 genes) @ [facility] - No pathogenic mutations detected   GERD (gastroesophageal reflux disease)    H/O bone density study    2015   H/O colonoscopy    History of ovarian cancer    November 1979   History of ovarian cancer 1979   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   Insomnia    Irregular heart beat    Normal coronary arteries Cath - 09/2014   Osteoporosis    Raynaud's disease    Past Surgical History:  Procedure Laterality Date   ABDOMINAL HYSTERECTOMY  1979   APPENDECTOMY     BILATERAL SALPINGOOPHORECTOMY     BREAST LUMPECTOMY     COLONOSCOPY  02/2012   LEFT HEART CATHETERIZATION WITH CORONARY ANGIOGRAM N/A 10/17/2014   Procedure: LEFT HEART CATHETERIZATION WITH CORONARY ANGIOGRAM;  Surgeon: Debby DELENA Sor, MD;  Location: [facility] CATH LAB;  Service: Cardiovascular;  Laterality: N/A;   MASTECTOMY Right 2018   no chemo no radiation no tamoxifen   ovarian ca s/p taj/bso  1979   SIMPLE MASTECTOMY WITH AXILLARY  SENTINEL NODE BIOPSY Right 05/05/2017   Procedure: RIGHT TOTAL MASTECTOMY WITH RIGHT  AXILLARY SENTINEL NODE BIOPSY;  Surgeon: Ebbie Cough, MD;  Location: [facility] OR;  Service: General;  Laterality: Right;  PECTORAL BLOCK   Patient Active Problem List   Diagnosis Date Noted   Lumbar stenosis 01/08/2023   Bloating 07/31/2022   Left sided abdominal pain 07/31/2022   Belching 07/31/2022   COPD (chronic obstructive pulmonary disease) (HCC) 03/17/2022   Osteoarthritis of carpometacarpal (CMC) joints of both thumbs 09/26/2021   Pain due to onychomycosis of toenails of both feet 12/25/2020   History of cerebellar stroke 10/05/2020   Memory loss 10/05/2020   Osteoarthritis of both hands 08/29/2020   Bilateral carpal tunnel syndrome 08/29/2020   Trigger thumb of left hand 08/29/2020   History of adenomatous polyp of colon 12/30/2019   Mild hyperlipidemia 06/01/2019   Degenerative arthritis of knee, bilateral 04/08/2019   Chronic idiopathic constipation 03/01/2019   Chronic cough 01/21/2018   Genetic testing 07/09/2017   history of Breast cancer of lower-outer quadrant of right female breast  01/16/2016   Palpitations 11/13/2014   Normal coronary arteries 10/17/2014   Varicose veins of bilateral lower extremities with other complications 03/09/2012   Hx of herpes simplex infection 12/18/2011  Low back pain 06/28/2010   Lower abdominal pain 06/28/2010   Generalized anxiety disorder 10/13/2008   Irritable bowel syndrome 10/13/2008   Lung nodule 11/25/2007   Raynaud's syndrome 07/01/2007   Aneurysm of thoracic aorta (HCC) 05/06/2007   Allergic rhinitis 05/06/2007   Esophageal reflux 05/06/2007   DEGENERATIVE DISC DISEASE, CERVICAL SPINE 05/06/2007   Osteoporosis 03/18/2007   History of ovarian cancer 09/22/1977    PCP: [name] MD   REFERRING PROVIDER: Leonce Katz, DO  REFERRING DIAG:  Diagnosis  M54.42,M54.41,G89.29 (ICD-10-CM) - Chronic bilateral low back pain  with bilateral sciatica  M48.062 (ICD-10-CM) - Spinal stenosis of lumbar region with neurogenic claudication    Rationale for Evaluation and Treatment: Rehabilitation  THERAPY DIAG:  Radiculopathy, lumbar region  Abnormal posture  Other low back pain  ONSET DATE: chronic  SUBJECTIVE:                                                                                                                                                                                           SUBJECTIVE STATEMENT: It started getting worse over the past 2-3 mos.  Pain in lumbar , SIJ into posterolateral legs.  She has tingling in both feet, L > R leg.  She does a stretching routine which she does daily, has helped her over the years.  She usually wakes with less pain and as the day goes on her pain gets severe.  She cannot sit too long and she cannot stand too long. She has difficulty with her home tasks, cannot stand back up off of the floor, crouching. I was always very active and now I'm worn down.  I need to do things and I cannot do them.  I like to be outside and it is hard to bend down to do things and stand back up. She feels like her posture is getting worse.  Reports generally weaker.  Heavy in her low back.   PERTINENT HISTORY:  MD note Dr. Leonce: Chronic with exacerbation, subsequent visit - Continued years of low back pain with several months of worsening back pain and worsening radicular symptoms, left > Right.  Consistent with flare of significant degenerative changes in lumbar spine as seen on prior lumbar MRI - Lumbar MRI 06/16/22 shows multifactorial severe spinal canal stenosis with cauda equina nerve root impingement at L3-L4, moderate left neural foraminal stenosis, impingement of left L5 nerve root at L4-L5, facet arthropathy at L5-S1 with potential irritation of S1 nerve root bilaterally, angled chronic compression deformities of T12 and L1 - Patient wishes to proceed with conservative therapy  and is not interested in epidural CSI  at this time - Start meloxicam  7.5 mg daily x2 weeks.  If still having pain after 2 weeks, complete 3rd-week of NSAID. May use remaining NSAID as needed once daily for pain control.  Do not to use additional over-the-counter NSAIDs (ibuprofen, naproxen, Advil, Aleve, etc.) while taking prescription NSAIDs.  May use Tylenol  [phone] mg 2 to 3 times a day for breakthrough pain.  -Start HEP and physical therapy for low back  PAIN:  Are you having pain? Yes: NPRS scale: yesterday was bad 6/10.  Today is OK but its early.  Pain usually 7/10-8/10 PM, has to recline during the day 1-2  Pain location: low back and legs  Pain description: heavy, aching , tingling  Aggravating factors: Activity,  Relieving factors: heat and massage   PRECAUTIONS: None  RED FLAGS: None   WEIGHT BEARING RESTRICTIONS: No  FALLS:  Has patient fallen in last 6 months? No Does feel like her balance has declined   LIVING ENVIRONMENT: Lives with: lives alone Lives in: House/apartment Stairs: Yes: Internal: 15 steps; on right going up3-4 STE Has following equipment at home: None  OCCUPATION: retired- Pensions consultant, Airline pilot, schools  widow 4 yrs ago  PLOF: Independent  PATIENT GOALS: Better posture , get up easier   NEXT MD VISIT: as needed   OBJECTIVE:  Note: Objective measures were completed at Evaluation unless otherwise noted.  DIAGNOSTIC FINDINGS:  MRI Lumbar 05/2022 IMPRESSION: 1. Multifactorial severe spinal canal stenosis with cauda equina nerve root impingement at L3-L4. Moderate left neural foraminal stenosis at this level. Additionally, there is edema in the left L4 posterior elements, likely degenerative/reactive in nature which may reflect a source of pain. 2. At L2-L3, there is mild-to-moderate spinal canal stenosis and moderate right neural foraminal stenosis with possible contact of the extraforaminal L2 nerve root. 3. Moderate spinal canal stenosis  at L4-L5 with left worse than right subarticular zone effacement and probable impingement of the traversing left L5 nerve root, and mild bilateral neural foraminal stenosis. 4. Moderate bilateral facet arthropathy with small effusions at L5-S1 contributes to bilateral subarticular zone narrowing and potential irritation of either traversing S1 nerve root and mild-to-moderate bilateral neural foraminal stenosis. 5. Unchanged chronic compression deformities of the T12 and L1 vertebral bodies. No evidence of acute injury.  PATIENT SURVEYS:  Modified Oswestry:  MODIFIED OSWESTRY DISABILITY SCALE  Date: 05/24/24 Score  Pain intensity 3 =  Pain medication provides me with moderate relief from pain.  2. Personal care (washing, dressing, etc.) 2 =  It is painful to take care of myself, and I am slow and careful.  3. Lifting 2 = Pain prevents me from lifting heavy weights off the floor, activities (eg. sports, dancing). but I can manage if the weights are conveniently positioned (3) Pain prevents me from going out very often. (eg, on a table).  4. Walking 1 = Pain prevents me from walking more than 1 mile.  5. Sitting 3 =  Pain prevents me from sitting more than  hour.  6. Standing 4 =  Pain prevents me from standing more than 10 minutes.  7. Sleeping 2 =  Even when I take pain medication, I sleep less than 6 hours  8. Social Life 2 = Pain prevents me from participating in more energetic activities (eg. sports, dancing).  9. Traveling 2 =  My pain restricts my travel over 2 hours.  10. Employment/ Homemaking 4 = Pain prevents me from doing even light duties.  Total 25/50  Interpretation of scores: Score Category Description  0-20% Minimal Disability The patient can cope with most living activities. Usually no treatment is indicated apart from advice on lifting, sitting and exercise  21-40% Moderate Disability The patient experiences more pain and difficulty with sitting, lifting and standing.  Travel and social life are more difficult and they may be disabled from work. Personal care, sexual activity and sleeping are not grossly affected, and the patient can usually be managed by conservative means  41-60% Severe Disability Pain remains the main problem in this group, but activities of daily living are affected. These patients require a detailed investigation  61-80% Crippled Back pain impinges on all aspects of the patient's life. Positive intervention is required  81-100% Bed-bound  These patients are either bed-bound or exaggerating their symptoms  Bluford FORBES Zoe DELENA Karon DELENA, et al. Surgery versus conservative management of stable thoracolumbar fracture: the PRESTO feasibility RCT. [place] ([place]): [employer]; 2021 Nov. [facility] [employer] Assessment, No. 25.62.) Appendix 3, Oswestry Disability Index category descriptors. Available from: [employer]el[employer]cz  Minimally Clinically Important Difference (MCID) = 12.8%  COGNITION: Overall cognitive status: Within functional limits for tasks assessed     SENSATION: WFL  MUSCLE LENGTH: Hamstrings:tight, passive SLR to 50 deg bilateral no back pain  Tight quads testing in prone   POSTURE: rounded shoulders, forward head, increased thoracic kyphosis, and flexed trunk   PALPATION: Grossly pain and sore low lumbar and across bilateral gluteals   LUMBAR ROM:   AROM eval  Flexion NT due to Osteoporosis  Extension 50% LE discomfort   Right lateral flexion 25%   Left lateral flexion 25%   Right rotation Pain Rt  Left rotation Stiff, less pain than Rt   (Blank rows = not tested)  LOWER EXTREMITY ROM:     Passive  Right eval Left eval  Hip flexion    Hip extension    Hip abduction    Hip adduction    Hip internal rotation Tight  tight  Hip external rotation    Knee flexion    Knee extension    Ankle dorsiflexion    Ankle plantarflexion    Ankle inversion    Ankle eversion      (Blank rows = not tested)  LOWER EXTREMITY MMT:    MMT Right eval Left eval  Hip flexion 4+ 4+  Hip extension 3+ 3+  Hip abduction 4+ 4+  Hip adduction    Hip internal rotation    Hip external rotation    Knee flexion 4+ 4+  Knee extension 4+ 4+  Ankle dorsiflexion 5 5  Ankle plantarflexion    Ankle inversion    Ankle eversion     (Blank rows = not tested)  LUMBAR SPECIAL TESTS:  Straight leg raise test: Negative  FUNCTIONAL TESTS:  NT   GAIT: Distance walked: 150 Assistive device utilized: None Level of assistance: Modified independence Comments: slow pace  TREATMENT DATE:   [facility] Adult PT Treatment:                                                DATE: 05/24/24 TSelf Care: POC, daily morning routine Flexion/rotation precautions and body mechanics Role of manual/modalities in PT recovery  PATIENT EDUCATION:  Education details: see above  Person educated: Patient Education method: Solicitor, and Handouts Education comprehension: verbalized understanding and returned demonstration  HOME EXERCISE PROGRAM: None yet, patient has lower body routine that appears adequate until more time allows   ASSESSMENT:  CLINICAL IMPRESSION: Patient is a 89 y.o. female who was seen today for physical therapy evaluation and treatment for low back pain with significant facet arthopathy and stenosis. She will complete her PT at [facility] location with this PT.  OBJECTIVE IMPAIRMENTS: decreased activity tolerance, decreased balance, decreased endurance, decreased mobility, difficulty walking, decreased ROM, decreased strength, hypomobility, increased fascial restrictions, impaired flexibility, impaired sensation, improper body mechanics, postural dysfunction, and pain.   ACTIVITY LIMITATIONS: carrying, lifting, bending, sitting, standing, squatting,  sleeping, stairs, and locomotion level  PARTICIPATION LIMITATIONS: meal prep, cleaning, laundry, shopping, community activity, and yard work  PERSONAL FACTORS: Time since onset of injury/illness/exacerbation and 3+ comorbidities: age, chronic back issues, osteoporosis, hx breast cancer are also affecting patient's functional outcome.   REHAB POTENTIAL: Good  CLINICAL DECISION MAKING: Stable/uncomplicated  EVALUATION COMPLEXITY: Low   GOALS: Goals reviewed with patient? Yes  SHORT TERM GOALS: Target date: 06/07/2024    Patient will be able to show independence for initial HEP to include posture, core and hip strength and stability.   Baseline: Goal status: INITIAL  2.  Pt will be shown proper hip hinge to begin using with home tasks, require no more than min cues  Baseline:  Goal status: INITIAL  LONG TERM GOALS: Target date: 07/19/2024    Pt will be I with final HEP upon discharge from PT for back, core, posture.  Baseline:  Goal status: INITIAL  2.  Pt will be able to stand for light home tasks with no more than min increase in back/leg pain for 10-15 min at a time.  Baseline:  Goal status: INITIAL  3.  Patient will be able to demonstrate proper posture and lifting techniques related to spine health and reduction of symptoms.   Baseline:  Goal status: INITIAL  4.  Pt will be able to report ability to kneel and get back up to stand for home tasks using furniture and improved confidence Baseline:  Goal status: INITIAL  5.  Further functional goals TBA  Baseline:  Goal status: INITIAL   PLAN:  PT FREQUENCY: 1-2x/week  PT DURATION: 8 weeks  PLANNED INTERVENTIONS: 97164- PT Re-evaluation, 97750- Physical Performance Testing, 97110-Therapeutic exercises, 97530- Therapeutic activity, [id number]- Neuromuscular re-education, 97535- Self Care, [id number]- Manual therapy, [phone]- Gait training, Patient/Family education, Balance training, Joint mobilization, Spinal mobilization,  Cryotherapy, and Moist heat.  PLAN FOR NEXT SESSION: HEP, hip hinging    Date of referral: 05/04/24 Referring provider: Leonce Katz, DO Referring diagnosis?  Diagnosis  M54.42,M54.41,G89.29 (ICD-10-CM) - Chronic bilateral low back pain with bilateral sciatica  M48.062 (ICD-10-CM) - Spinal stenosis of lumbar region with neurogenic claudication   Treatment diagnosis? (if different than referring diagnosis) lumbar radiculopathy, low back pain, difficulty walking, abnormal posture  What was this (referring dx) caused by? Ongoing Issue  Lysle of Condition: Chronic (continuous duration > 3 months)   Laterality: Both  Current Functional Measure Score: Back Index 25/50  Objective measurements identify impairments when they are compared to normal values, the uninvolved extremity, and prior level of function.  [x]  Yes  []  No  Objective assessment of functional ability: Moderate functional limitations   Briefly describe symptoms: Chronic , worsening symptoms > 2-3 mos ago.  Pain has become more severe  in PM hours with increasing difficulty coming back up from a standing or crouched position.  General weakness in trunk and LEs .  Pain prohibits her from doing her normal daily activities   How did symptoms start: Gradual over time   Average pain intensity:  Last 24 hours: 3/10-7/10  Past week: 3/10-8/10   How often does the pt experience symptoms? Frequently  How much have the symptoms interfered with usual daily activities? Moderately  How has condition changed since care began at this facility? NA - initial visit  In general, how is the patients overall health? Very Good   BACK PAIN (STarT Back Screening Tool) Has pain spread down the leg(s) at some time in the last 2 weeks? YES Has there been pain in the shoulder or neck at some time in the last 2 weeks? NO Has the pt only walked short distances because of back pain? YES Has patient dressed more slowly because of back  pain in the past 2 weeks? YES Does patient think it's not safe for a person with this condition to be physically active? NO Does patient have worrying thoughts a lot of the time? NO Does patient feel back pain is terrible and will never get any better? NO Has patient stopped enjoying things they usually enjoy? YES    Jovaughn Wojtaszek, PT 05/24/2024, 11:29 AM   Delon Norma, PT 05/24/24 1:48 PM Phone: [phone] Fax: [phone]

## 2024-05-24 ENCOUNTER — Encounter: Payer: Self-pay | Admitting: Physical Therapy

## 2024-05-24 ENCOUNTER — Ambulatory Visit: Attending: Sports Medicine | Admitting: Physical Therapy

## 2024-05-24 DIAGNOSIS — R262 Difficulty in walking, not elsewhere classified: Secondary | ICD-10-CM | POA: Diagnosis present

## 2024-05-24 DIAGNOSIS — R293 Abnormal posture: Secondary | ICD-10-CM | POA: Diagnosis present

## 2024-05-24 DIAGNOSIS — M5416 Radiculopathy, lumbar region: Secondary | ICD-10-CM | POA: Diagnosis present

## 2024-05-24 DIAGNOSIS — G8929 Other chronic pain: Secondary | ICD-10-CM | POA: Insufficient documentation

## 2024-05-24 DIAGNOSIS — M5459 Other low back pain: Secondary | ICD-10-CM | POA: Insufficient documentation

## 2024-05-24 DIAGNOSIS — M5442 Lumbago with sciatica, left side: Secondary | ICD-10-CM | POA: Insufficient documentation

## 2024-05-24 DIAGNOSIS — M48062 Spinal stenosis, lumbar region with neurogenic claudication: Secondary | ICD-10-CM | POA: Diagnosis not present

## 2024-05-24 DIAGNOSIS — M5441 Lumbago with sciatica, right side: Secondary | ICD-10-CM | POA: Diagnosis not present

## 2024-06-02 ENCOUNTER — Ambulatory Visit (INDEPENDENT_AMBULATORY_CARE_PROVIDER_SITE_OTHER): Admitting: Physical Therapy

## 2024-06-02 ENCOUNTER — Encounter: Payer: Self-pay | Admitting: Physical Therapy

## 2024-06-02 DIAGNOSIS — R293 Abnormal posture: Secondary | ICD-10-CM | POA: Diagnosis not present

## 2024-06-02 DIAGNOSIS — M5416 Radiculopathy, lumbar region: Secondary | ICD-10-CM | POA: Diagnosis not present

## 2024-06-02 DIAGNOSIS — M5459 Other low back pain: Secondary | ICD-10-CM

## 2024-06-02 DIAGNOSIS — R262 Difficulty in walking, not elsewhere classified: Secondary | ICD-10-CM

## 2024-06-02 NOTE — Therapy (Signed)
 OUTPATIENT PHYSICAL THERAPY NOTE   Patient Name: Cindy Schwartz MRN: [id number] DOB:May 02, 1934, 89 y.o., female Today's Date: 06/02/2024  END OF SESSION:  PT End of Session - 06/02/24 1319     Visit Number 2    Number of Visits 12    Date for PT Re-Evaluation 07/19/24    Authorization Type [employer] Medicare, Generic commercial    PT Start Time 1015    PT Stop Time 1100    PT Time Calculation (min) 45 min    Activity Tolerance Patient tolerated treatment well    Behavior During Therapy WFL for tasks assessed/performed           Past Medical History:  Diagnosis Date   Aneurysm of ascending aorta (HCC)    a. 4.2-4.3cm in 2014.   Anxiety    Arthritis    Breast cancer (HCC) 2018   right mastectomy   Cancer (HCC) 1980   ovarian cancer   Compression fracture of L1 lumbar vertebra (HCC)    COMPRESSION FRACTURE, THORACIC VERTEBRA 07/24/2010   Qualifier: Diagnosis of  By: [name], [name]    Elevated BP Transient 09/2014   Genetic testing 07/09/2017   Multi-Cancer panel (83 genes) @ [facility] - No pathogenic mutations detected   GERD (gastroesophageal reflux disease)    H/O bone density study    2015   H/O colonoscopy    History of ovarian cancer    Cindy 1979   History of ovarian cancer 1979   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   Insomnia    Irregular heart beat    Normal coronary arteries Cath - 09/2014   Osteoporosis    Raynaud's disease    Past Surgical History:  Procedure Laterality Date   ABDOMINAL HYSTERECTOMY  1979   APPENDECTOMY     BILATERAL SALPINGOOPHORECTOMY     BREAST LUMPECTOMY     COLONOSCOPY  02/2012   LEFT HEART CATHETERIZATION WITH CORONARY ANGIOGRAM N/A 10/17/2014   Procedure: LEFT HEART CATHETERIZATION WITH CORONARY ANGIOGRAM;  Surgeon: Debby DELENA Sor, MD;  Location: [facility] CATH LAB;  Service: Cardiovascular;  Laterality: N/A;   MASTECTOMY Right 2018   no chemo no radiation no tamoxifen   ovarian ca s/p taj/bso  1979    SIMPLE MASTECTOMY WITH AXILLARY SENTINEL NODE BIOPSY Right 05/05/2017   Procedure: RIGHT TOTAL MASTECTOMY WITH RIGHT  AXILLARY SENTINEL NODE BIOPSY;  Surgeon: Ebbie Cough, MD;  Location: [facility] OR;  Service: General;  Laterality: Right;  PECTORAL BLOCK   Patient Active Problem List   Diagnosis Date Noted   Lumbar stenosis 01/08/2023   Bloating 07/31/2022   Left sided abdominal pain 07/31/2022   Belching 07/31/2022   COPD (chronic obstructive pulmonary disease) (HCC) 03/17/2022   Osteoarthritis of carpometacarpal (CMC) joints of both thumbs 09/26/2021   Pain due to onychomycosis of toenails of both feet 12/25/2020   History of cerebellar stroke 10/05/2020   Memory loss 10/05/2020   Osteoarthritis of both hands 08/29/2020   Bilateral carpal tunnel syndrome 08/29/2020   Trigger thumb of left hand 08/29/2020   History of adenomatous polyp of colon 12/30/2019   Mild hyperlipidemia 06/01/2019   Degenerative arthritis of knee, bilateral 04/08/2019   Chronic idiopathic constipation 03/01/2019   Chronic cough 01/21/2018   Genetic testing 07/09/2017   history of Breast cancer of lower-outer quadrant of right female breast  01/16/2016   Palpitations 11/13/2014   Normal coronary arteries 10/17/2014   Varicose veins of bilateral lower extremities with other complications 03/09/2012  Hx of herpes simplex infection 12/18/2011   Low back pain 06/28/2010   Lower abdominal pain 06/28/2010   Generalized anxiety disorder 10/13/2008   Irritable bowel syndrome 10/13/2008   Lung nodule 11/25/2007   Raynaud's syndrome 07/01/2007   Aneurysm of thoracic aorta (HCC) 05/06/2007   Allergic rhinitis 05/06/2007   Esophageal reflux 05/06/2007   DEGENERATIVE DISC DISEASE, CERVICAL SPINE 05/06/2007   Osteoporosis 03/18/2007   History of ovarian cancer 09/22/1977    PCP: [name] MD   REFERRING PROVIDER: Leonce Katz, DO  REFERRING DIAG:  Diagnosis  M54.42,M54.41,G89.29 (ICD-10-CM) -  Chronic bilateral low back pain with bilateral sciatica  M48.062 (ICD-10-CM) - Spinal stenosis of lumbar region with neurogenic claudication    Rationale for Evaluation and Treatment: Rehabilitation  THERAPY DIAG:  Radiculopathy, lumbar region  Abnormal posture  Other low back pain  Difficulty in walking, not elsewhere classified  ONSET DATE: chronic  SUBJECTIVE:                                                                                                                                                                                           SUBJECTIVE STATEMENT: It started getting worse over the past 2-3 mos.  Pain in lumbar , SIJ into posterolateral legs.  She has tingling in both feet, L > R leg.  She does a stretching routine which she does daily, has helped her over the years.  She usually wakes with less pain and as the day goes on her pain gets severe.  She cannot sit too long and she cannot stand too long. She has difficulty with her home tasks, cannot stand back up off of the floor, crouching. I was always very active and now I'm worn down.  I need to do things and I cannot do them.  I like to be outside and it is hard to bend down to do things and stand back up. She feels like her posture is getting worse.  Reports generally weaker.  Heavy in her low back.   PERTINENT HISTORY:  MD note Dr. Leonce: Chronic with exacerbation, subsequent visit - Continued years of low back pain with several months of worsening back pain and worsening radicular symptoms, left > Right.  Consistent with flare of significant degenerative changes in lumbar spine as seen on prior lumbar MRI - Lumbar MRI 06/16/22 shows multifactorial severe spinal canal stenosis with cauda equina nerve root impingement at L3-L4, moderate left neural foraminal stenosis, impingement of left L5 nerve root at L4-L5, facet arthropathy at L5-S1 with potential irritation of S1 nerve root bilaterally, angled chronic compression  deformities of T12 and L1 -  Patient wishes to proceed with conservative therapy and is not interested in epidural CSI at this time - Start meloxicam  7.5 mg daily x2 weeks.  If still having pain after 2 weeks, complete 3rd-week of NSAID. May use remaining NSAID as needed once daily for pain control.  Do not to use additional over-the-counter NSAIDs (ibuprofen, naproxen, Advil, Aleve, etc.) while taking prescription NSAIDs.  May use Tylenol  [phone] mg 2 to 3 times a day for breakthrough pain.  -Start HEP and physical therapy for low back  PAIN:  Are you having pain? Yes: NPRS scale: yesterday was bad 6/10.  Today is OK but its early.  Pain usually 7/10-8/10 PM, has to recline during the day 1-2  Pain location: low back and legs  Pain description: heavy, aching , tingling  Aggravating factors: Activity,  Relieving factors: heat and massage   PRECAUTIONS: None  RED FLAGS: None   WEIGHT BEARING RESTRICTIONS: No  FALLS:  Has patient fallen in last 6 months? No Does feel like her balance has declined   LIVING ENVIRONMENT: Lives with: lives alone Lives in: House/apartment Stairs: Yes: Internal: 15 steps; on right going up3-4 STE Has following equipment at home: None  OCCUPATION: retired- Pensions consultant, Airline pilot, schools  widow 4 yrs ago  PLOF: Independent  PATIENT GOALS: Better posture , get up easier   NEXT MD VISIT: as needed   OBJECTIVE:  Note: Objective measures were completed at Evaluation unless otherwise noted.  DIAGNOSTIC FINDINGS:  MRI Lumbar 05/2022 IMPRESSION: 1. Multifactorial severe spinal canal stenosis with cauda equina nerve root impingement at L3-L4. Moderate left neural foraminal stenosis at this level. Additionally, there is edema in the left L4 posterior elements, likely degenerative/reactive in nature which may reflect a source of pain. 2. At L2-L3, there is mild-to-moderate spinal canal stenosis and moderate right neural foraminal stenosis with possible  contact of the extraforaminal L2 nerve root. 3. Moderate spinal canal stenosis at L4-L5 with left worse than right subarticular zone effacement and probable impingement of the traversing left L5 nerve root, and mild bilateral neural foraminal stenosis. 4. Moderate bilateral facet arthropathy with small effusions at L5-S1 contributes to bilateral subarticular zone narrowing and potential irritation of either traversing S1 nerve root and mild-to-moderate bilateral neural foraminal stenosis. 5. Unchanged chronic compression deformities of the T12 and L1 vertebral bodies. No evidence of acute injury.  PATIENT SURVEYS:  Modified Oswestry:  MODIFIED OSWESTRY DISABILITY SCALE  Date: 05/24/24 Score  Pain intensity 3 =  Pain medication provides me with moderate relief from pain.  2. Personal care (washing, dressing, etc.) 2 =  It is painful to take care of myself, and I am slow and careful.  3. Lifting 2 = Pain prevents me from lifting heavy weights off the floor, activities (eg. sports, dancing). but I can manage if the weights are conveniently positioned (3) Pain prevents me from going out very often. (eg, on a table).  4. Walking 1 = Pain prevents me from walking more than 1 mile.  5. Sitting 3 =  Pain prevents me from sitting more than  hour.  6. Standing 4 =  Pain prevents me from standing more than 10 minutes.  7. Sleeping 2 =  Even when I take pain medication, I sleep less than 6 hours  8. Social Life 2 = Pain prevents me from participating in more energetic activities (eg. sports, dancing).  9. Traveling 2 =  My pain restricts my travel over 2 hours.  10. Employment/ Homemaking 4 =  Pain prevents me from doing even light duties.  Total 25/50   Interpretation of scores: Score Category Description  0-20% Minimal Disability The patient can cope with most living activities. Usually no treatment is indicated apart from advice on lifting, sitting and exercise  21-40% Moderate Disability The  patient experiences more pain and difficulty with sitting, lifting and standing. Travel and social life are more difficult and they may be disabled from work. Personal care, sexual activity and sleeping are not grossly affected, and the patient can usually be managed by conservative means  41-60% Severe Disability Pain remains the main problem in this group, but activities of daily living are affected. These patients require a detailed investigation  61-80% Crippled Back pain impinges on all aspects of the patient's life. Positive intervention is required  81-100% Bed-bound  These patients are either bed-bound or exaggerating their symptoms  Bluford FORBES Zoe DELENA Karon DELENA, et al. Surgery versus conservative management of stable thoracolumbar fracture: the PRESTO feasibility RCT. [place] ([place]): [employer]; 2021 Nov. [facility] Technology Assessment, No. 25.62.) Appendix 3, Oswestry Disability Index category descriptors. Available from: FindJewel[employer].cz  Minimally Clinically Important Difference (MCID) = 12.8%  COGNITION: Overall cognitive status: Within functional limits for tasks assessed     SENSATION: WFL  MUSCLE LENGTH: Hamstrings:tight, passive SLR to 50 deg bilateral no back pain  Tight quads testing in prone   POSTURE: rounded shoulders, forward head, increased thoracic kyphosis, and flexed trunk   PALPATION: Grossly pain and sore low lumbar and across bilateral gluteals   LUMBAR ROM:   AROM eval  Flexion NT due to Osteoporosis  Extension 50% LE discomfort   Right lateral flexion 25%   Left lateral flexion 25%   Right rotation Pain Rt  Left rotation Stiff, less pain than Rt   (Blank rows = not tested)  LOWER EXTREMITY ROM:     Passive  Right eval Left eval  Hip flexion    Hip extension    Hip abduction    Hip adduction    Hip internal rotation Tight  tight  Hip external rotation    Knee flexion    Knee extension    Ankle  dorsiflexion    Ankle plantarflexion    Ankle inversion    Ankle eversion     (Blank rows = not tested)  LOWER EXTREMITY MMT:    MMT Right eval Left eval  Hip flexion 4+ 4+  Hip extension 3+ 3+  Hip abduction 4+ 4+  Hip adduction    Hip internal rotation    Hip external rotation    Knee flexion 4+ 4+  Knee extension 4+ 4+  Ankle dorsiflexion 5 5  Ankle plantarflexion    Ankle inversion    Ankle eversion     (Blank rows = not tested)  LUMBAR SPECIAL TESTS:  Straight leg raise test: Negative  FUNCTIONAL TESTS:  NT   06/02/24: 5 X STS= 16. 4 sec   30 sec STS= 9 sec   GAIT: Distance walked: 150 Assistive device utilized: None Level of assistance: Modified independence Comments: slow pace  TREATMENT DATE:    [facility] Adult PT Treatment:                                                DATE: 06/02/24 Therapeutic Exercise: Supine A/P tilt added breathing for core Knee to  chest double and single Hamstring 3 way  x 1 each LE  LTR x 10  Therapeutic Activity: Functional testing STS  Step up with hip flex 1 UE  SLS  trials each LE x 5 Hip abduction  10 lbs chest with high knees  Suitcase hold with march      [facility] Adult PT Treatment:                                                DATE: 05/24/24 TSelf Care: POC, daily morning routine Flexion/rotation precautions and body mechanics Role of manual/modalities in PT recovery                                                                                                                         PATIENT EDUCATION:  Education details: see above  Person educated: Patient Education method: Explanation, Demonstration, and Handouts Education comprehension: verbalized understanding and returned demonstration  HOME EXERCISE PROGRAM: Access Code: [facility] URL: [email or web address] Date: 06/02/2024 Prepared by: Delon Norma  Exercises - Supine Single Knee to Chest Stretch  - 1 x daily - 7 x weekly - 2 sets - 3-5  reps - 30 hold - Supine Lower Trunk Rotation  - 1 x daily - 7 x weekly - 2 sets - 10 reps - 10 hold - Supine Double Knee to Chest  - 1 x daily - 7 x weekly - 1 sets - 5 reps - 30 hold - Supine Hamstring Stretch with Strap  - 1 x daily - 7 x weekly - 1 sets - 3 reps - 30 hold - Standing Shoulder Horizontal Abduction with Resistance  - 1 x daily - 7 x weekly - 2 sets - 10 reps - 5 hold - Sit to Stand Without Arm Support  - 1 x daily - 7 x weekly - 2 sets - 10 reps ASSESSMENT:  CLINICAL IMPRESSION: Patient was given an HEP that reflects what she already does at home and what else she could work on for reucing back pain and improving hip strength, pelvic stability .   Rt LE is functionally weaker with SLS .  Patient will continue to benefit from skilled PT in order to return to PLOF and optimize functional mobility.       OBJECTIVE IMPAIRMENTS: decreased activity tolerance, decreased balance, decreased endurance, decreased mobility, difficulty walking, decreased ROM, decreased strength, hypomobility, increased fascial restrictions, impaired flexibility, impaired sensation, improper body mechanics, postural dysfunction, and pain.   ACTIVITY LIMITATIONS: carrying, lifting, bending, sitting, standing, squatting, sleeping, stairs, and locomotion level  PARTICIPATION LIMITATIONS: meal prep, cleaning, laundry, shopping, community activity, and yard work  PERSONAL FACTORS: Time since onset of injury/illness/exacerbation and 3+ comorbidities: age, chronic back issues, osteoporosis, hx breast cancer are also affecting patient's functional outcome.   REHAB POTENTIAL: Good  CLINICAL DECISION MAKING: Stable/uncomplicated  EVALUATION COMPLEXITY:  Low   GOALS: Goals reviewed with patient? Yes  SHORT TERM GOALS: Target date: 06/07/2024    Patient will be able to show independence for initial HEP to include posture, core and hip strength and stability.   Baseline: Goal status: INITIAL  2.  Pt will be  shown proper hip hinge to begin using with home tasks, require no more than min cues  Baseline:  Goal status: INITIAL  LONG TERM GOALS: Target date: 07/19/2024    Pt will be I with final HEP upon discharge from PT for back, core, posture.  Baseline:  Goal status: INITIAL  2.  Pt will be able to stand for light home tasks with no more than min increase in back/leg pain for 10-15 min at a time.  Baseline:  Goal status: INITIAL  3.  Patient will be able to demonstrate proper posture and lifting techniques related to spine health and reduction of symptoms.   Baseline:  Goal status: INITIAL  4.  Pt will be able to report ability to kneel and get back up to stand for home tasks using furniture and improved confidence Baseline:  Goal status: INITIAL  5.  Further functional goals TBA  Baseline:  Goal status: INITIAL   PLAN:  PT FREQUENCY: 1-2x/week  PT DURATION: 8 weeks  PLANNED INTERVENTIONS: [id number]- PT Re-evaluation, 97750- Physical Performance Testing, 97110-Therapeutic exercises, 97530- Therapeutic activity, [id number]- Neuromuscular re-education, 97535- Self Care, [id number]- Manual therapy, [phone]- Gait training, Patient/Family education, Balance training, Joint mobilization, Spinal mobilization, Cryotherapy, and Moist heat.  PLAN FOR NEXT SESSION: HEP, hip hinging    Date of referral: 05/04/24 Referring provider: Leonce Katz, DO Referring diagnosis?  Diagnosis  M54.42,M54.41,G89.29 (ICD-10-CM) - Chronic bilateral low back pain with bilateral sciatica  M48.062 (ICD-10-CM) - Spinal stenosis of lumbar region with neurogenic claudication   Treatment diagnosis? (if different than referring diagnosis) lumbar radiculopathy, low back pain, difficulty walking, abnormal posture  What was this (referring dx) caused by? Ongoing Issue  Lysle of Condition: Chronic (continuous duration > 3 months)   Laterality: Both  Current Functional Measure Score: Back Index 25/50  Objective  measurements identify impairments when they are compared to normal values, the uninvolved extremity, and prior level of function.  [x]  Yes  []  No  Objective assessment of functional ability: Moderate functional limitations   Briefly describe symptoms: Chronic , worsening symptoms > 2-3 mos ago.  Pain has become more severe in PM hours with increasing difficulty coming back up from a standing or crouched position.  General weakness in trunk and LEs .  Pain prohibits her from doing her normal daily activities   How did symptoms start: Gradual over time   Average pain intensity:  Last 24 hours: 3/10-7/10  Past week: 3/10-8/10   How often does the pt experience symptoms? Frequently  How much have the symptoms interfered with usual daily activities? Moderately  How has condition changed since care began at this facility? NA - initial visit  In general, how is the patients overall health? Very Good   BACK PAIN (STarT Back Screening Tool) Has pain spread down the leg(s) at some time in the last 2 weeks? YES Has there been pain in the shoulder or neck at some time in the last 2 weeks? NO Has the pt only walked short distances because of back pain? YES Has patient dressed more slowly because of back pain in the past 2 weeks? YES Does patient think it's not safe for a person with this condition to  be physically active? NO Does patient have worrying thoughts a lot of the time? NO Does patient feel back pain is terrible and will never get any better? NO Has patient stopped enjoying things they usually enjoy? YES    Eugean Arnott, PT 06/02/2024, 1:19 PM  Delon Norma, PT 06/02/24 1:24 PM Phone: [phone] Fax: [phone]

## 2024-06-03 NOTE — Therapy (Signed)
 OUTPATIENT PHYSICAL THERAPY NOTE   Patient Name: Cindy Schwartz MRN: [id number] DOB:15-Aug-1934, 89 y.o., female Today's Date: 06/06/2024  END OF SESSION:  PT End of Session - 06/06/24 0936     Visit Number 3    Number of Visits 12    Date for PT Re-Evaluation 07/19/24    Authorization Type [employer] Medicare, Generic commercial    PT Start Time [phone]    PT Stop Time 1015    PT Time Calculation (min) 41 min    Activity Tolerance Patient tolerated treatment well    Behavior During Therapy WFL for tasks assessed/performed            Past Medical History:  Diagnosis Date   Aneurysm of ascending aorta (HCC)    a. 4.2-4.3cm in 2014.   Anxiety    Arthritis    Breast cancer (HCC) 2018   right mastectomy   Cancer (HCC) 1980   ovarian cancer   Compression fracture of L1 lumbar vertebra (HCC)    COMPRESSION FRACTURE, THORACIC VERTEBRA 07/24/2010   Qualifier: Diagnosis of  By: [name], [name]    Elevated BP Transient 09/2014   Genetic testing 07/09/2017   Multi-Cancer panel (83 genes) @ [facility] - No pathogenic mutations detected   GERD (gastroesophageal reflux disease)    H/O bone density study    2015   H/O colonoscopy    History of ovarian cancer    November 1979   History of ovarian cancer 1979   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   Insomnia    Irregular heart beat    Normal coronary arteries Cath - 09/2014   Osteoporosis    Raynaud's disease    Past Surgical History:  Procedure Laterality Date   ABDOMINAL HYSTERECTOMY  1979   APPENDECTOMY     BILATERAL SALPINGOOPHORECTOMY     BREAST LUMPECTOMY     COLONOSCOPY  02/2012   LEFT HEART CATHETERIZATION WITH CORONARY ANGIOGRAM N/A 10/17/2014   Procedure: LEFT HEART CATHETERIZATION WITH CORONARY ANGIOGRAM;  Surgeon: Debby DELENA Sor, MD;  Location: [facility] CATH LAB;  Service: Cardiovascular;  Laterality: N/A;   MASTECTOMY Right 2018   no chemo no radiation no tamoxifen   ovarian ca s/p taj/bso  1979    SIMPLE MASTECTOMY WITH AXILLARY SENTINEL NODE BIOPSY Right 05/05/2017   Procedure: RIGHT TOTAL MASTECTOMY WITH RIGHT  AXILLARY SENTINEL NODE BIOPSY;  Surgeon: Ebbie Cough, MD;  Location: [facility] OR;  Service: General;  Laterality: Right;  PECTORAL BLOCK   Patient Active Problem List   Diagnosis Date Noted   Lumbar stenosis 01/08/2023   Bloating 07/31/2022   Left sided abdominal pain 07/31/2022   Belching 07/31/2022   COPD (chronic obstructive pulmonary disease) (HCC) 03/17/2022   Osteoarthritis of carpometacarpal (CMC) joints of both thumbs 09/26/2021   Pain due to onychomycosis of toenails of both feet 12/25/2020   History of cerebellar stroke 10/05/2020   Memory loss 10/05/2020   Osteoarthritis of both hands 08/29/2020   Bilateral carpal tunnel syndrome 08/29/2020   Trigger thumb of left hand 08/29/2020   History of adenomatous polyp of colon 12/30/2019   Mild hyperlipidemia 06/01/2019   Degenerative arthritis of knee, bilateral 04/08/2019   Chronic idiopathic constipation 03/01/2019   Chronic cough 01/21/2018   Genetic testing 07/09/2017   history of Breast cancer of lower-outer quadrant of right female breast  01/16/2016   Palpitations 11/13/2014   Normal coronary arteries 10/17/2014   Varicose veins of bilateral lower extremities with other complications 03/09/2012  Hx of herpes simplex infection 12/18/2011   Low back pain 06/28/2010   Lower abdominal pain 06/28/2010   Generalized anxiety disorder 10/13/2008   Irritable bowel syndrome 10/13/2008   Lung nodule 11/25/2007   Raynaud's syndrome 07/01/2007   Aneurysm of thoracic aorta (HCC) 05/06/2007   Allergic rhinitis 05/06/2007   Esophageal reflux 05/06/2007   DEGENERATIVE DISC DISEASE, CERVICAL SPINE 05/06/2007   Osteoporosis 03/18/2007   History of ovarian cancer 09/22/1977    PCP: [name] MD   REFERRING PROVIDER: Leonce Katz, DO  REFERRING DIAG:  Diagnosis  M54.42,M54.41,G89.29 (ICD-10-CM) -  Chronic bilateral low back pain with bilateral sciatica  M48.062 (ICD-10-CM) - Spinal stenosis of lumbar region with neurogenic claudication    Rationale for Evaluation and Treatment: Rehabilitation  THERAPY DIAG:  No diagnosis found.  ONSET DATE: chronic  SUBJECTIVE:                                                                                                                                                                                           SUBJECTIVE STATEMENT: Yesterday she had a lot of back and LE pain.   This am she has mod pain, tolerable.    PERTINENT HISTORY:  MD note Dr. Leonce: Chronic with exacerbation, subsequent visit - Continued years of low back pain with several months of worsening back pain and worsening radicular symptoms, left > Right.  Consistent with flare of significant degenerative changes in lumbar spine as seen on prior lumbar MRI - Lumbar MRI 06/16/22 shows multifactorial severe spinal canal stenosis with cauda equina nerve root impingement at L3-L4, moderate left neural foraminal stenosis, impingement of left L5 nerve root at L4-L5, facet arthropathy at L5-S1 with potential irritation of S1 nerve root bilaterally, angled chronic compression deformities of T12 and L1 - Patient wishes to proceed with conservative therapy and is not interested in epidural CSI at this time - Start meloxicam  7.5 mg daily x2 weeks.  If still having pain after 2 weeks, complete 3rd-week of NSAID. May use remaining NSAID as needed once daily for pain control.  Do not to use additional over-the-counter NSAIDs (ibuprofen, naproxen, Advil, Aleve, etc.) while taking prescription NSAIDs.  May use Tylenol  [phone] mg 2 to 3 times a day for breakthrough pain.  -Start HEP and physical therapy for low back  PAIN:  Are you having pain? Yes: NPRS scale: yesterday was bad 6/10.  Today is OK but its early.  Pain usually 7/10-8/10 PM, has to recline during the day 1-2  Pain location: low  back and legs  Pain description: heavy, aching , tingling  Aggravating factors: Activity,  Relieving factors:  heat and massage   PRECAUTIONS: None  RED FLAGS: None   WEIGHT BEARING RESTRICTIONS: No  FALLS:  Has patient fallen in last 6 months? No Does feel like her balance has declined   LIVING ENVIRONMENT: Lives with: lives alone Lives in: House/apartment Stairs: Yes: Internal: 15 steps; on right going up3-4 STE Has following equipment at home: None  OCCUPATION: retired- Pensions consultant, Airline pilot, schools  widow 4 yrs ago  PLOF: Independent  PATIENT GOALS: Better posture , get up easier   NEXT MD VISIT: as needed   OBJECTIVE:  Note: Objective measures were completed at Evaluation unless otherwise noted.  DIAGNOSTIC FINDINGS:  MRI Lumbar 05/2022 IMPRESSION: 1. Multifactorial severe spinal canal stenosis with cauda equina nerve root impingement at L3-L4. Moderate left neural foraminal stenosis at this level. Additionally, there is edema in the left L4 posterior elements, likely degenerative/reactive in nature which may reflect a source of pain. 2. At L2-L3, there is mild-to-moderate spinal canal stenosis and moderate right neural foraminal stenosis with possible contact of the extraforaminal L2 nerve root. 3. Moderate spinal canal stenosis at L4-L5 with left worse than right subarticular zone effacement and probable impingement of the traversing left L5 nerve root, and mild bilateral neural foraminal stenosis. 4. Moderate bilateral facet arthropathy with small effusions at L5-S1 contributes to bilateral subarticular zone narrowing and potential irritation of either traversing S1 nerve root and mild-to-moderate bilateral neural foraminal stenosis. 5. Unchanged chronic compression deformities of the T12 and L1 vertebral bodies. No evidence of acute injury.  PATIENT SURVEYS:  Modified Oswestry:  MODIFIED OSWESTRY DISABILITY SCALE  Date: 05/24/24 Score  Pain intensity  3 =  Pain medication provides me with moderate relief from pain.  2. Personal care (washing, dressing, etc.) 2 =  It is painful to take care of myself, and I am slow and careful.  3. Lifting 2 = Pain prevents me from lifting heavy weights off the floor, activities (eg. sports, dancing). but I can manage if the weights are conveniently positioned (3) Pain prevents me from going out very often. (eg, on a table).  4. Walking 1 = Pain prevents me from walking more than 1 mile.  5. Sitting 3 =  Pain prevents me from sitting more than  hour.  6. Standing 4 =  Pain prevents me from standing more than 10 minutes.  7. Sleeping 2 =  Even when I take pain medication, I sleep less than 6 hours  8. Social Life 2 = Pain prevents me from participating in more energetic activities (eg. sports, dancing).  9. Traveling 2 =  My pain restricts my travel over 2 hours.  10. Employment/ Homemaking 4 = Pain prevents me from doing even light duties.  Total 25/50   Interpretation of scores: Score Category Description  0-20% Minimal Disability The patient can cope with most living activities. Usually no treatment is indicated apart from advice on lifting, sitting and exercise  21-40% Moderate Disability The patient experiences more pain and difficulty with sitting, lifting and standing. Travel and social life are more difficult and they may be disabled from work. Personal care, sexual activity and sleeping are not grossly affected, and the patient can usually be managed by conservative means  41-60% Severe Disability Pain remains the main problem in this group, but activities of daily living are affected. These patients require a detailed investigation  61-80% Crippled Back pain impinges on all aspects of the patient's life. Positive intervention is required  81-100% Bed-bound  These patients  are either bed-bound or exaggerating their symptoms  Bluford FORBES Zoe DELENA Karon DELENA, et al. Surgery versus conservative management  of stable thoracolumbar fracture: the PRESTO feasibility RCT. [place] ([place]): [employer]; 2021 Nov. [place] [facility] Technology Assessment, No. 25.62.) Appendix 3, Oswestry Disability Index category descriptors. Available from: FindJewel[employer].cz  Minimally Clinically Important Difference (MCID) = 12.8%  COGNITION: Overall cognitive status: Within functional limits for tasks assessed     SENSATION: WFL  MUSCLE LENGTH: Hamstrings:tight, passive SLR to 50 deg bilateral no back pain  Tight quads testing in prone   POSTURE: rounded shoulders, forward head, increased thoracic kyphosis, and flexed trunk   PALPATION: Grossly pain and sore low lumbar and across bilateral gluteals   LUMBAR ROM:   AROM eval  Flexion NT due to Osteoporosis  Extension 50% LE discomfort   Right lateral flexion 25%   Left lateral flexion 25%   Right rotation Pain Rt  Left rotation Stiff, less pain than Rt   (Blank rows = not tested)  LOWER EXTREMITY ROM:     Passive  Right eval Left eval  Hip flexion    Hip extension    Hip abduction    Hip adduction    Hip internal rotation Tight  tight  Hip external rotation    Knee flexion    Knee extension    Ankle dorsiflexion    Ankle plantarflexion    Ankle inversion    Ankle eversion     (Blank rows = not tested)  LOWER EXTREMITY MMT:    MMT Right eval Left eval  Hip flexion 4+ 4+  Hip extension 3+ 3+  Hip abduction 4+ 4+  Hip adduction    Hip internal rotation    Hip external rotation    Knee flexion 4+ 4+  Knee extension 4+ 4+  Ankle dorsiflexion 5 5  Ankle plantarflexion    Ankle inversion    Ankle eversion     (Blank rows = not tested)  LUMBAR SPECIAL TESTS:  Straight leg raise test: Negative  FUNCTIONAL TESTS:  NT   06/02/24: 5 X STS= 16. 4 sec   30 sec STS= 9 sec   GAIT: Distance walked: 150 Assistive device utilized: None Level of assistance: Modified independence Comments: slow  pace  TREATMENT DATE:   [facility] Adult PT Treatment:                                                DATE: 06/06/24 Therapeutic Exercise: Recumbent bike 6 min level 1  Standing exercises at wall, cues for posture  Wall squat x 10  Wall sit Squat to chair x 10 , cues for hip flex/hinge Hip abduction red band  Sidelying clam red band  Supine bridge  Bridge with march   Single and double knee to chest   [facility] Adult PT Treatment:                                                DATE: 06/02/24 Therapeutic Exercise: Supine A/P tilt added breathing for core Knee to chest double and single Hamstring 3 way  x 1 each LE  LTR x 10  Therapeutic Activity: Functional testing STS  Step up with hip flex 1 UE  SLS  trials each LE x 5 Hip abduction  10 lbs chest with high knees  Suitcase hold with march      [facility] Adult PT Treatment:                                                DATE: 05/24/24 TSelf Care: POC, daily morning routine Flexion/rotation precautions and body mechanics Role of manual/modalities in PT recovery                                                                                                                         PATIENT EDUCATION:  Education details: see above  Person educated: Patient Education method: Explanation, Demonstration, and Handouts Education comprehension: verbalized understanding and returned demonstration  HOME EXERCISE PROGRAM: Access Code: [facility] URL: [email or web address] Date: 06/02/2024 Prepared by: Delon Norma  Exercises - Supine Single Knee to Chest Stretch  - 1 x daily - 7 x weekly - 2 sets - 3-5 reps - 30 hold - Supine Lower Trunk Rotation  - 1 x daily - 7 x weekly - 2 sets - 10 reps - 10 hold - Supine Double Knee to Chest  - 1 x daily - 7 x weekly - 1 sets - 5 reps - 30 hold - Supine Hamstring Stretch with Strap  - 1 x daily - 7 x weekly - 1 sets - 3 reps - 30 hold - Standing Shoulder Horizontal Abduction with Resistance  - 1  x daily - 7 x weekly - 2 sets - 10 reps - 5 hold - Sit to Stand Without Arm Support  - 1 x daily - 7 x weekly - 2 sets - 10 reps   ASSESSMENT:   CLINICAL IMPRESSION: Patient able to tolerate standing exercises well today with no increase in back pain.  Patient uses flexion in supine stretches to counteract any muscle soreness.  Patient will continue to benefit from skilled PT in order to return to PLOF and optimize functional mobility.       OBJECTIVE IMPAIRMENTS: decreased activity tolerance, decreased balance, decreased endurance, decreased mobility, difficulty walking, decreased ROM, decreased strength, hypomobility, increased fascial restrictions, impaired flexibility, impaired sensation, improper body mechanics, postural dysfunction, and pain.   ACTIVITY LIMITATIONS: carrying, lifting, bending, sitting, standing, squatting, sleeping, stairs, and locomotion level  PARTICIPATION LIMITATIONS: meal prep, cleaning, laundry, shopping, community activity, and yard work  PERSONAL FACTORS: Time since onset of injury/illness/exacerbation and 3+ comorbidities: age, chronic back issues, osteoporosis, hx breast cancer are also affecting patient's functional outcome.   REHAB POTENTIAL: Good  CLINICAL DECISION MAKING: Stable/uncomplicated  EVALUATION COMPLEXITY: Low   GOALS: Goals reviewed with patient? Yes  SHORT TERM GOALS: Target date: 06/07/2024    Patient will be able to show independence for initial HEP to include posture, core and hip strength and stability.   Baseline: Goal status:  ongoing   2.  Pt will be shown proper hip hinge to begin using with home tasks, require no more than min cues  Baseline:  Goal status: ongoing   LONG TERM GOALS: Target date: 07/19/2024    Pt will be I with final HEP upon discharge from PT for back, core, posture.  Baseline:  Goal status: INITIAL  2.  Pt will be able to stand for light home tasks with no more than min increase in back/leg pain  for 10-15 min at a time.  Baseline:  Goal status: INITIAL  3.  Patient will be able to demonstrate proper posture and lifting techniques related to spine health and reduction of symptoms.   Baseline:  Goal status: INITIAL  4.  Pt will be able to report ability to kneel and get back up to stand for home tasks using furniture and improved confidence Baseline:  Goal status: INITIAL  5.  Further functional goals TBA  Baseline:  Goal status: INITIAL   PLAN:  PT FREQUENCY: 1-2x/week  PT DURATION: 8 weeks  PLANNED INTERVENTIONS: [id number]- PT Re-evaluation, 97750- Physical Performance Testing, 97110-Therapeutic exercises, 97530- Therapeutic activity, [id number]- Neuromuscular re-education, 97535- Self Care, [id number]- Manual therapy, [id number]- Gait training, Patient/Family education, Balance training, Joint mobilization, Spinal mobilization, Cryotherapy, and Moist heat.  PLAN FOR NEXT SESSION: HEP, hip hinging   Keiri Solano, PT 06/06/2024, 9:37 AM  Delon Norma, PT 06/06/24 9:37 AM Phone: [phone] Fax: [phone]

## 2024-06-06 ENCOUNTER — Encounter: Payer: Self-pay | Admitting: Physical Therapy

## 2024-06-06 ENCOUNTER — Ambulatory Visit (INDEPENDENT_AMBULATORY_CARE_PROVIDER_SITE_OTHER): Admitting: Physical Therapy

## 2024-06-06 DIAGNOSIS — M5459 Other low back pain: Secondary | ICD-10-CM

## 2024-06-06 DIAGNOSIS — M6281 Muscle weakness (generalized): Secondary | ICD-10-CM

## 2024-06-06 DIAGNOSIS — M5416 Radiculopathy, lumbar region: Secondary | ICD-10-CM

## 2024-06-06 DIAGNOSIS — R262 Difficulty in walking, not elsewhere classified: Secondary | ICD-10-CM

## 2024-06-06 DIAGNOSIS — R293 Abnormal posture: Secondary | ICD-10-CM

## 2024-06-06 DIAGNOSIS — M79661 Pain in right lower leg: Secondary | ICD-10-CM

## 2024-06-08 ENCOUNTER — Encounter: Payer: Self-pay | Admitting: Physical Therapy

## 2024-06-08 ENCOUNTER — Ambulatory Visit (INDEPENDENT_AMBULATORY_CARE_PROVIDER_SITE_OTHER): Admitting: Physical Therapy

## 2024-06-08 DIAGNOSIS — M5459 Other low back pain: Secondary | ICD-10-CM | POA: Diagnosis not present

## 2024-06-08 DIAGNOSIS — R293 Abnormal posture: Secondary | ICD-10-CM | POA: Diagnosis not present

## 2024-06-08 DIAGNOSIS — M6281 Muscle weakness (generalized): Secondary | ICD-10-CM

## 2024-06-08 DIAGNOSIS — M5416 Radiculopathy, lumbar region: Secondary | ICD-10-CM | POA: Diagnosis not present

## 2024-06-08 DIAGNOSIS — R262 Difficulty in walking, not elsewhere classified: Secondary | ICD-10-CM | POA: Diagnosis not present

## 2024-06-08 NOTE — Therapy (Signed)
 OUTPATIENT PHYSICAL THERAPY NOTE   Patient Name: Cindy Schwartz MRN: [id number] DOB:1934-08-09, 89 y.o., female Today's Date: 06/08/2024  END OF SESSION:  PT End of Session - 06/08/24 1022     Visit Number 4    Number of Visits 12    Date for PT Re-Evaluation 07/19/24    Authorization Type [employer] Medicare, Generic commercial    PT Start Time 1019    PT Stop Time 1100    PT Time Calculation (min) 41 min    Activity Tolerance Patient tolerated treatment well    Behavior During Therapy WFL for tasks assessed/performed            Past Medical History:  Diagnosis Date   Aneurysm of ascending aorta (HCC)    a. 4.2-4.3cm in 2014.   Anxiety    Arthritis    Breast cancer (HCC) 2018   right mastectomy   Cancer (HCC) 1980   ovarian cancer   Compression fracture of L1 lumbar vertebra (HCC)    COMPRESSION FRACTURE, THORACIC VERTEBRA 07/24/2010   Qualifier: Diagnosis of  By: [name], [name]    Elevated BP Transient 09/2014   Genetic testing 07/09/2017   Multi-Cancer panel (83 genes) @ [facility] - No pathogenic mutations detected   GERD (gastroesophageal reflux disease)    H/O bone density study    2015   H/O colonoscopy    History of ovarian cancer    November 1979   History of ovarian cancer 1979   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   Insomnia    Irregular heart beat    Normal coronary arteries Cath - 09/2014   Osteoporosis    Raynaud's disease    Past Surgical History:  Procedure Laterality Date   ABDOMINAL HYSTERECTOMY  1979   APPENDECTOMY     BILATERAL SALPINGOOPHORECTOMY     BREAST LUMPECTOMY     COLONOSCOPY  02/2012   LEFT HEART CATHETERIZATION WITH CORONARY ANGIOGRAM N/A 10/17/2014   Procedure: LEFT HEART CATHETERIZATION WITH CORONARY ANGIOGRAM;  Surgeon: Debby DELENA Sor, MD;  Location: [facility] CATH LAB;  Service: Cardiovascular;  Laterality: N/A;   MASTECTOMY Right 2018   no chemo no radiation no tamoxifen   ovarian ca s/p taj/bso  1979    SIMPLE MASTECTOMY WITH AXILLARY SENTINEL NODE BIOPSY Right 05/05/2017   Procedure: RIGHT TOTAL MASTECTOMY WITH RIGHT  AXILLARY SENTINEL NODE BIOPSY;  Surgeon: Ebbie Cough, MD;  Location: [facility] OR;  Service: General;  Laterality: Right;  PECTORAL BLOCK   Patient Active Problem List   Diagnosis Date Noted   Lumbar stenosis 01/08/2023   Bloating 07/31/2022   Left sided abdominal pain 07/31/2022   Belching 07/31/2022   COPD (chronic obstructive pulmonary disease) (HCC) 03/17/2022   Osteoarthritis of carpometacarpal (CMC) joints of both thumbs 09/26/2021   Pain due to onychomycosis of toenails of both feet 12/25/2020   History of cerebellar stroke 10/05/2020   Memory loss 10/05/2020   Osteoarthritis of both hands 08/29/2020   Bilateral carpal tunnel syndrome 08/29/2020   Trigger thumb of left hand 08/29/2020   History of adenomatous polyp of colon 12/30/2019   Mild hyperlipidemia 06/01/2019   Degenerative arthritis of knee, bilateral 04/08/2019   Chronic idiopathic constipation 03/01/2019   Chronic cough 01/21/2018   Genetic testing 07/09/2017   history of Breast cancer of lower-outer quadrant of right female breast  01/16/2016   Palpitations 11/13/2014   Normal coronary arteries 10/17/2014   Varicose veins of bilateral lower extremities with other complications 03/09/2012  Hx of herpes simplex infection 12/18/2011   Low back pain 06/28/2010   Lower abdominal pain 06/28/2010   Generalized anxiety disorder 10/13/2008   Irritable bowel syndrome 10/13/2008   Lung nodule 11/25/2007   Raynaud's syndrome 07/01/2007   Aneurysm of thoracic aorta (HCC) 05/06/2007   Allergic rhinitis 05/06/2007   Esophageal reflux 05/06/2007   DEGENERATIVE DISC DISEASE, CERVICAL SPINE 05/06/2007   Osteoporosis 03/18/2007   History of ovarian cancer 09/22/1977    PCP: [name] MD   REFERRING PROVIDER: Leonce Katz, DO  REFERRING DIAG:  Diagnosis  M54.42,M54.41,G89.29 (ICD-10-CM) -  Chronic bilateral low back pain with bilateral sciatica  M48.062 (ICD-10-CM) - Spinal stenosis of lumbar region with neurogenic claudication    Rationale for Evaluation and Treatment: Rehabilitation  THERAPY DIAG:  Radiculopathy, lumbar region  Abnormal posture  Other low back pain  Difficulty in walking, not elsewhere classified  Muscle weakness (generalized)  ONSET DATE: chronic  SUBJECTIVE:                                                                                                                                                                                           SUBJECTIVE STATEMENT: Had so much pain yesterday in Rt LE and back, is sore right now, was stabbing.   PERTINENT HISTORY:  MD note Dr. Leonce: Chronic with exacerbation, subsequent visit - Continued years of low back pain with several months of worsening back pain and worsening radicular symptoms, left > Right.  Consistent with flare of significant degenerative changes in lumbar spine as seen on prior lumbar MRI - Lumbar MRI 06/16/22 shows multifactorial severe spinal canal stenosis with cauda equina nerve root impingement at L3-L4, moderate left neural foraminal stenosis, impingement of left L5 nerve root at L4-L5, facet arthropathy at L5-S1 with potential irritation of S1 nerve root bilaterally, angled chronic compression deformities of T12 and L1 - Patient wishes to proceed with conservative therapy and is not interested in epidural CSI at this time - Start meloxicam  7.5 mg daily x2 weeks.  If still having pain after 2 weeks, complete 3rd-week of NSAID. May use remaining NSAID as needed once daily for pain control.  Do not to use additional over-the-counter NSAIDs (ibuprofen, naproxen, Advil, Aleve, etc.) while taking prescription NSAIDs.  May use Tylenol  [phone] mg 2 to 3 times a day for breakthrough pain.  -Start HEP and physical therapy for low back  PAIN:  Are you having pain? Yes: NPRS scale: 7/10   Pain usually 7/10-8/10 PM, has to recline during the day 1-2, Rt LE   Pain location: low back and legs  Pain description: heavy, aching ,  tingling  Aggravating factors: Activity,  Relieving factors: heat and massage   PRECAUTIONS: None  RED FLAGS: None   WEIGHT BEARING RESTRICTIONS: No  FALLS:  Has patient fallen in last 6 months? No Does feel like her balance has declined   LIVING ENVIRONMENT: Lives with: lives alone Lives in: House/apartment Stairs: Yes: Internal: 15 steps; on right going up3-4 STE Has following equipment at home: None  OCCUPATION: retired- Pensions consultant, Airline pilot, schools  widow 4 yrs ago  PLOF: Independent  PATIENT GOALS: Better posture , get up easier   NEXT MD VISIT: as needed   OBJECTIVE:  Note: Objective measures were completed at Evaluation unless otherwise noted.  DIAGNOSTIC FINDINGS:  MRI Lumbar 05/2022 IMPRESSION: 1. Multifactorial severe spinal canal stenosis with cauda equina nerve root impingement at L3-L4. Moderate left neural foraminal stenosis at this level. Additionally, there is edema in the left L4 posterior elements, likely degenerative/reactive in nature which may reflect a source of pain. 2. At L2-L3, there is mild-to-moderate spinal canal stenosis and moderate right neural foraminal stenosis with possible contact of the extraforaminal L2 nerve root. 3. Moderate spinal canal stenosis at L4-L5 with left worse than right subarticular zone effacement and probable impingement of the traversing left L5 nerve root, and mild bilateral neural foraminal stenosis. 4. Moderate bilateral facet arthropathy with small effusions at L5-S1 contributes to bilateral subarticular zone narrowing and potential irritation of either traversing S1 nerve root and mild-to-moderate bilateral neural foraminal stenosis. 5. Unchanged chronic compression deformities of the T12 and L1 vertebral bodies. No evidence of acute injury.  PATIENT SURVEYS:   Modified Oswestry:  MODIFIED OSWESTRY DISABILITY SCALE  Date: 05/24/24 Score  Pain intensity 3 =  Pain medication provides me with moderate relief from pain.  2. Personal care (washing, dressing, etc.) 2 =  It is painful to take care of myself, and I am slow and careful.  3. Lifting 2 = Pain prevents me from lifting heavy weights off the floor, activities (eg. sports, dancing). but I can manage if the weights are conveniently positioned (3) Pain prevents me from going out very often. (eg, on a table).  4. Walking 1 = Pain prevents me from walking more than 1 mile.  5. Sitting 3 =  Pain prevents me from sitting more than  hour.  6. Standing 4 =  Pain prevents me from standing more than 10 minutes.  7. Sleeping 2 =  Even when I take pain medication, I sleep less than 6 hours  8. Social Life 2 = Pain prevents me from participating in more energetic activities (eg. sports, dancing).  9. Traveling 2 =  My pain restricts my travel over 2 hours.  10. Employment/ Homemaking 4 = Pain prevents me from doing even light duties.  Total 25/50   Interpretation of scores: Score Category Description  0-20% Minimal Disability The patient can cope with most living activities. Usually no treatment is indicated apart from advice on lifting, sitting and exercise  21-40% Moderate Disability The patient experiences more pain and difficulty with sitting, lifting and standing. Travel and social life are more difficult and they may be disabled from work. Personal care, sexual activity and sleeping are not grossly affected, and the patient can usually be managed by conservative means  41-60% Severe Disability Pain remains the main problem in this group, but activities of daily living are affected. These patients require a detailed investigation  61-80% Crippled Back pain impinges on all aspects of the patient's life. Positive intervention  is required  81-100% Bed-bound  These patients are either bed-bound or exaggerating  their symptoms  Bluford FORBES Zoe DELENA Karon DELENA, et al. Surgery versus conservative management of stable thoracolumbar fracture: the PRESTO feasibility RCT. [place] ([place]): [employer]; 2021 Nov. [facility] Technology Assessment, No. 25.62.) Appendix 3, Oswestry Disability Index category descriptors. Available from: FindJewel[employer]cz  Minimally Clinically Important Difference (MCID) = 12.8%  COGNITION: Overall cognitive status: Within functional limits for tasks assessed     SENSATION: WFL  MUSCLE LENGTH: Hamstrings:tight, passive SLR to 50 deg bilateral no back pain  Tight quads testing in prone   POSTURE: rounded shoulders, forward head, increased thoracic kyphosis, and flexed trunk   PALPATION: Grossly pain and sore low lumbar and across bilateral gluteals   LUMBAR ROM:   AROM eval  Flexion NT due to Osteoporosis  Extension 50% LE discomfort   Right lateral flexion 25%   Left lateral flexion 25%   Right rotation Pain Rt  Left rotation Stiff, less pain than Rt   (Blank rows = not tested)  LOWER EXTREMITY ROM:     Passive  Right eval Left eval  Hip flexion    Hip extension    Hip abduction    Hip adduction    Hip internal rotation Tight  tight  Hip external rotation    Knee flexion    Knee extension    Ankle dorsiflexion    Ankle plantarflexion    Ankle inversion    Ankle eversion     (Blank rows = not tested)  LOWER EXTREMITY MMT:    MMT Right eval Left eval  Hip flexion 4+ 4+  Hip extension 3+ 3+  Hip abduction 4+ 4+  Hip adduction    Hip internal rotation    Hip external rotation    Knee flexion 4+ 4+  Knee extension 4+ 4+  Ankle dorsiflexion 5 5  Ankle plantarflexion    Ankle inversion    Ankle eversion     (Blank rows = not tested)  LUMBAR SPECIAL TESTS:  Straight leg raise test: Negative  FUNCTIONAL TESTS:  NT   06/02/24: 5 X STS= 16. 4 sec   30 sec STS= 9 sec   GAIT: Distance walked:  150 Assistive device utilized: None Level of assistance: Modified independence Comments: slow pace  TREATMENT DATE:    [facility] Adult PT Treatment:                                                DATE: 06/08/24 Therapeutic Activity: Abdominal bracing with coordinated breath Shoulder ext GTB x 10  Added march x 5 each side Ball bridge knees bent x 10 HS curl with ball  LTR with ball   Knee to chest  Banded clam GTB  x 15  Hip abduction x 15  Hamstring/ITB strap x 3    [facility] Adult PT Treatment:                                                DATE: 06/06/24 Therapeutic Exercise: Recumbent bike 6 min level 1  Standing exercises at wall, cues for posture  Wall squat x 10  Wall sit Squat to chair x 10 , cues for hip flex/hinge  Hip abduction red band  Sidelying clam red band  Supine bridge  Bridge with march   Single and double knee to chest   [facility] Adult PT Treatment:                                                DATE: 06/02/24 Therapeutic Exercise: Supine A/P tilt added breathing for core Knee to chest double and single Hamstring 3 way  x 1 each LE  LTR x 10  Therapeutic Activity: Functional testing STS  Step up with hip flex 1 UE  SLS  trials each LE x 5 Hip abduction  10 lbs chest with high knees  Suitcase hold with march      [facility] Adult PT Treatment:                                                DATE: 05/24/24 TSelf Care: POC, daily morning routine Flexion/rotation precautions and body mechanics Role of manual/modalities in PT recovery                                                                                                                         PATIENT EDUCATION:  Education details: see above  Person educated: Patient Education method: Explanation, Demonstration, and Handouts Education comprehension: verbalized understanding and returned demonstration  HOME EXERCISE PROGRAM: Access Code: [facility] URL: [email or web address] Date:  06/02/2024 Prepared by: Delon Norma  Exercises - Supine Single Knee to Chest Stretch  - 1 x daily - 7 x weekly - 2 sets - 3-5 reps - 30 hold - Supine Lower Trunk Rotation  - 1 x daily - 7 x weekly - 2 sets - 10 reps - 10 hold - Supine Double Knee to Chest  - 1 x daily - 7 x weekly - 1 sets - 5 reps - 30 hold - Supine Hamstring Stretch with Strap  - 1 x daily - 7 x weekly - 1 sets - 3 reps - 30 hold - Standing Shoulder Horizontal Abduction with Resistance  - 1 x daily - 7 x weekly - 2 sets - 10 reps - 5 hold - Sit to Stand Without Arm Support  - 1 x daily - 7 x weekly - 2 sets - 10 reps   ASSESSMENT:   CLINICAL IMPRESSION:  Patient with increased pain in her back and Rt leg today.  Chose to keep her exercises to the mat and focus on core stability and control today. Patient will continue to benefit from skilled PT in order to return to PLOF and optimize functional mobility.       OBJECTIVE IMPAIRMENTS: decreased activity tolerance, decreased balance, decreased endurance, decreased mobility, difficulty walking, decreased ROM, decreased strength, hypomobility,  increased fascial restrictions, impaired flexibility, impaired sensation, improper body mechanics, postural dysfunction, and pain.   ACTIVITY LIMITATIONS: carrying, lifting, bending, sitting, standing, squatting, sleeping, stairs, and locomotion level  PARTICIPATION LIMITATIONS: meal prep, cleaning, laundry, shopping, community activity, and yard work  PERSONAL FACTORS: Time since onset of injury/illness/exacerbation and 3+ comorbidities: age, chronic back issues, osteoporosis, hx breast cancer are also affecting patient's functional outcome.   REHAB POTENTIAL: Good  CLINICAL DECISION MAKING: Stable/uncomplicated  EVALUATION COMPLEXITY: Low   GOALS: Goals reviewed with patient? Yes  SHORT TERM GOALS: Target date: 06/07/2024    Patient will be able to show independence for initial HEP to include posture, core and hip  strength and stability.   Baseline: Goal status: ongoing   2.  Pt will be shown proper hip hinge to begin using with home tasks, require no more than min cues  Baseline:  Goal status: ongoing   LONG TERM GOALS: Target date: 07/19/2024    Pt will be I with final HEP upon discharge from PT for back, core, posture.  Baseline:  Goal status: INITIAL  2.  Pt will be able to stand for light home tasks with no more than min increase in back/leg pain for 10-15 min at a time.  Baseline:  Goal status: INITIAL  3.  Patient will be able to demonstrate proper posture and lifting techniques related to spine health and reduction of symptoms.   Baseline:  Goal status: INITIAL  4.  Pt will be able to report ability to kneel and get back up to stand for home tasks using furniture and improved confidence Baseline:  Goal status: INITIAL  5.  Further functional goals TBA  Baseline:  Goal status: INITIAL   PLAN:  PT FREQUENCY: 1-2x/week  PT DURATION: 8 weeks  PLANNED INTERVENTIONS: [id number]- PT Re-evaluation, 97750- Physical Performance Testing, 97110-Therapeutic exercises, 97530- Therapeutic activity, [id number]- Neuromuscular re-education, 97535- Self Care, [id number]- Manual therapy, [phone]- Gait training, Patient/Family education, Balance training, Joint mobilization, Spinal mobilization, Cryotherapy, and Moist heat.  PLAN FOR NEXT SESSION: HEP, hip hinging   Forrest Demuro, PT 06/08/2024, 10:57 AM  Delon Norma, PT 06/08/24 10:57 AM Phone: [phone] Fax: [phone]

## 2024-06-11 NOTE — Progress Notes (Unsigned)
 [facility] Return Visit   Referring Provider [name], MD [address] [place],  [place] [address]  Primary Care Provider [name], MD  Patient Profile: Cindy Schwartz is a 89 y.o. female who returns to the [facility] for follow-up of the problem(s) noted below.  Problem List: Chronic constipation Colonic diverticulosis History of colon polyps-tubular adenoma and sessile serrated adenoma GERD Esophageal dysmotility Bloating-borderline test for SIBO   History of Present Illness   Cindy Schwartz was last seen in the GI office 03/09/2024 by Cathryne May, NP   Current GI Meds  Nexium  20 mg p.o. daily Probiotic   Interval History   Discussed the use of AI scribe software for clinical note transcription with the patient, who gave verbal consent to proceed.  History of Present Illness Cindy Schwartz is an 89 year old female with a past medical history noteworthy for aortic aneurysm, Raynaud's syndrome, COPD, carpal tunnel syndrome, breast cancer, ovarian cancer who returns to the gastroenterology office for follow-up of epigastric abdominal discomfort, bloating, eructation constipation and GERD  Epigastric abdominal discomfort, eructation, GERD - Persistent belching and gastrointestinal discomfort occurring almost daily, particularly when lying down, which delays sleep onset - Sensation of fullness and occasional nausea accompanying the discomfort - Abdominal massage provides relief and leads to burping, which alleviates symptoms - Occasional mild abdominal bulging and discomfort - No acidity experienced - No esophageal dysphagia  - Previously switched from Nexium  to Lansoprazole  without improvement, then returned to Nexium  20 mg once daily at midday - Previously used Nexium  40 mg -did not find this any more helpful than Nexium  20 mg orally daily - Persistent issues with belching despite acid suppression therapy - Previous trial of  FDgard did not yield benefit  - Concern about esophageal health - Barium swallow in 2022 and CT angiogram of the chest in 2024 showed no abnormalities  - No weight loss, nausea, vomiting or other alarm features  Constipation management and bloating - Manages constipation with Miralax and dietary adjustments - Considering adding chia seeds to her diet  - Uses a probiotic once daily before dinner - Recently resumed probiotic use, with significant improvement in symptoms - Despite improvement, still experiences occasional mild abdominal bulging and discomfort  - CTAP 2024 showed colonic diverticulosis without diverticulitis and moderate stool burden   GI Review of Symptoms Significant for epigastric abdominal discomfort, eructation. Otherwise negative.  General Review of Systems  Review of systems is significant for the pertinent positives and negatives as listed per the HPI.  Full ROS is otherwise negative.  Past Medical History   Past Medical History:  Diagnosis Date   Aneurysm of ascending aorta (HCC)    a. 4.2-4.3cm in 2014.   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   Insomnia  Irregular heart beat    Normal coronary arteries Cath - 09/2014   Osteoporosis    Raynaud's disease      Past Surgical History   Past Surgical History:  Procedure Laterality Date   ABDOMINAL  HYSTERECTOMY  1979   APPENDECTOMY     BILATERAL SALPINGOOPHORECTOMY     BREAST LUMPECTOMY     COLONOSCOPY  02/2012   LEFT HEART CATHETERIZATION WITH CORONARY ANGIOGRAM N/A 10/17/2014   Procedure: LEFT HEART CATHETERIZATION WITH CORONARY ANGIOGRAM;  Surgeon: Debby DELENA Sor, MD;  Location: [facility] CATH LAB;  Service: Cardiovascular;  Laterality: N/A;   MASTECTOMY Right 2018   no chemo no radiation no tamoxifen   ovarian ca s/p taj/bso  1979   SIMPLE MASTECTOMY WITH AXILLARY SENTINEL NODE BIOPSY Right 05/05/2017   Procedure: RIGHT TOTAL MASTECTOMY WITH RIGHT  AXILLARY SENTINEL NODE BIOPSY;  Surgeon: Ebbie Cough, MD;  Location: [facility] OR;  Service: General;  Laterality: Right;  PECTORAL BLOCK     Allergies and Medications   Allergies  Allergen Reactions   Chlorhexidine  Dermatitis   Prednisone  Other (See Comments)    Fatigue and depressed mood on 7 day 40mg  x 3 days, 20 mg x 4 days from Dr. Katrinka- intolerance  prednisone   Fatigue and depressed mood on 7 day 40mg  x 3 days, 20 mg x 4 days from Dr. Katrinka- intolerance  prednisone     prednisone    Chlorhexidine  Gluconate Rash   Current Meds  Medication Sig   Ascorbic Acid (VITAMIN C) 1000 MG tablet Take 1,000 mg by mouth daily.   aspirin  EC (ASPIRIN  LOW DOSE) 81 MG tablet TAKE 1 TABLET DAILY (SWALLOW WHOLE)   Calcium  Carbonate (CALCIUM  500 PO) Take by mouth.   Cholecalciferol 25 MCG (1000 UT) capsule Take by mouth. (Patient taking differently: Take by mouth. Takes in the winter months)   COLLAGEN PO Take by mouth.   gabapentin  (NEURONTIN ) 100 MG capsule Take 100 mg by mouth 2 (two) times daily.   ketoconazole (NIZORAL) 2 % cream APPLY TOPICALLY TO EYEBROWS TWICE DAILY   MAGNESIUM  PO Take 300 mg by mouth.   nitroGLYCERIN  (NITROSTAT ) 0.4 MG SL tablet Place 1 tablet (0.4 mg total) under the tongue every 5 (five) minutes as needed for chest pain.   Probiotic Product (PROBIOTIC PO) Take 1 capsule by mouth daily.   rosuvastatin  (CRESTOR ) 5  MG tablet Take 1 tablet (5 mg total) by mouth daily. Take 1 tablet daily by mouth.   valACYclovir  (VALTREX ) 500 MG tablet Take 1 tablet (500 mg total) by mouth 2 (two) times a week.   [DISCONTINUED] esomeprazole  (NEXIUM ) 20 MG capsule Take 1 capsule by mouth daily at 12 noon.    Family His   Family History  Problem Relation Age of Onset   Hip fracture Mother    Other Father        lived to 32   Esophageal cancer Cousin 9       mat first cousin related through uncle   Healthy Brother    Colon polyps Brother    Colon cancer Neg Hx    Pancreatic cancer Neg Hx    Stomach cancer Neg Hx     Social History   Social History   Tobacco Use   Smoking status: Never   Smokeless tobacco: Never  Vaping Use   Vaping status: Never Used  Substance Use Topics   Alcohol use: No   Drug use: No   Cindy Schwartz reports that she has never smoked. She has  never used smokeless tobacco. She reports that she does not drink alcohol and does not use drugs.  Vital Signs and Physical Examination   Vitals:   06/13/24 0841  BP: 102/60  Pulse: 70   Body mass index is 22.47 kg/m. Weight: 135 lb (61.2 kg)  General: Well developed, well nourished, no acute distress Head: Normocephalic and atraumatic Eyes: Sclerae anicteric, EOMI Lungs: Clear throughout to auscultation Heart: Regular rate and rhythm; No murmurs, rubs or bruits Abdomen: Soft, non tender and non distended. No masses, hepatosplenomegaly or hernias noted. Normal Bowel sounds Rectal: Deferred Musculoskeletal: Symmetrical with no gross deformities    Review of Data   The following data was reviewed at the time of this encounter:   Laboratory Studies      Latest Ref Rng & Units 04/04/2024    8:58 AM 02/24/2024    9:51 AM 02/01/2024    4:21 PM  CBC  WBC 4.0 - 10.5 K/uL 4.7  5.2  6.9   Hemoglobin 12.0 - 15.0 g/dL 86.9  86.9  87.2   Hematocrit 36.0 - 46.0 % 39.6  39.1  38.3   Platelets 150.0 - 400.0 K/uL 224.0  214.0  234.0     Lab  Results  Component Value Date   LIPASE 38.0 03/09/2023      Latest Ref Rng & Units 04/04/2024    8:58 AM 02/01/2024    4:21 PM 10/13/2023   10:44 AM  CMP  Glucose 70 - 99 mg/dL 86  96  89   BUN 6 - 23 mg/dL 13  18  12    Creatinine 0.40 - 1.20 mg/dL 9.32  9.32  9.45   Sodium 135 - 145 mEq/L 133  133  137   Potassium 3.5 - 5.1 mEq/L 4.4  3.8  4.0   Chloride 96 - 112 mEq/L 97  99  101   CO2 19 - 32 mEq/L 30  28  29    Calcium  8.4 - 10.5 mg/dL 89.9  9.6  9.7   Total Protein 6.0 - 8.3 g/dL 7.2  7.0  7.1   Total Bilirubin 0.2 - 1.2 mg/dL 0.6  0.4  0.5   Alkaline Phos 39 - 117 U/L 69  57  70   AST 0 - 37 U/L 21  22  23    ALT 0 - 35 U/L 18  14  15       Imaging Studies  KUB 03/09/2024 1. Moderate fecal retention throughout the colon consistent with constipation. No obstruction or ileus.  CT chest 03/02/2024 1. No evidence of interstitial lung disease. 2. 4.2 cm ascending aortic aneurysm, stable. Recommend annual imaging followup by CTA or MRA. This recommendation follows 2010 ACCF/AHA/AATS/ACR/ASA/SCA/SCAI/SIR/STS/SVM Guidelines for the Diagnosis and Management of Patients with Thoracic Aortic Disease. Circulation. 2010; 121: [id number]. Aortic aneurysm NOS (ICD10-I71.9). 3. Multiple small splenic artery aneurysms. 4. Aortic atherosclerosis (ICD10-I70.0). Coronary artery calcification. 5. Enlarged pulmonic trunk, indicative of pulmonary arterial hypertension.  CTAP 03/18/2023 Colonic diverticulosis, without radiographic evidence of diverticulitis or other acute findings.  Large stool burden noted; recommend clinical correlation for possible constipation.  GI Procedures and Studies   12/08/19 EGD/colonoscopy with Dr. Donnald Colonoscopy- Impression: Diverticulosis of the sigmoid colon. Three diminutive polyps in the cecum.  Biopsied. Two diminutive poylps in the transverse colon. Biopsied. The distal rectum and anal verge are normal on the retroflexion view. No definite  pathology seen to account for patients abdominal pain.  Path: Final microscopic diagnosis Small intestine, duodenum biopsy Focal incidental lymphangiectasia,  otherwise bowel mucosa with no significant Pathologic changes.  Negative for features of celiac disease. Giardia Organisms not present. Stomach anntrum bx reactive gastropathy. H pylori organisms not present. large intestine-cecum poylp Tubular adenomas (2) polypoid colonic mucosa with lymphoid aggregate. negative for dysplasia. Large intestine- transverse colon, polyp. Tubular adenoma. sessile serrated adenoma/poylp.   Clinical Impression  It is my clinical impression that Cindy Schwartz is a 89 y.o. female with;  Chronic constipation Colonic diverticulosis History of colon polyps-tubular adenoma and sessile serrated adenoma GERD Esophageal dysmotility Bloating-borderline test for SIBO  Cindy Schwartz returns to the gastroenterology office for follow-up of multiple gastrointestinal issues as outlined above.  In terms of her GERD, she did not note any substantial difference after changing from Nexium  to lansoprazole .  She therefore requests to continue on Nexium  20 mg orally daily.  She offers that even when she increased her dose of Nexium  to 40 mg orally daily she did not see a difference in terms of her eructation.  Previous trial of FDgard was not efficacious.  She is generally satisfied with her current regimen.  She does express concern regarding a sensation of discomfort in her epigastrium and feeling as though food is not digesting through her stomach.  No esophageal dysphagia.  She worries about the health of her esophagus and stomach.  We discussed that we could consider an upper GI series to evaluate the contour of her upper GI tract to rule out mass lesions, excetra.  At her current age she is high risk for anesthesia so would recommend deferring upper endoscopy unless it is absolutely necessary.  Constipation is currently  well-controlled with the use of as needed MiraLAX.  Previous SIBO test was borderline for bacterial overgrowth.  She did not find rifaximin  helpful.  She has had substantial improvement in bloating with a probiotic that she purchased at [employer].  She did not find benefit from low FODMAP diet in the past.  If bloating recurs would consider testing for celiac disease and possibly empiric gluten-free diet.  Plan  Continue OTC probiotic Continue MiraLAX as needed High-fiber diet for constipation-patient may consider adding in Chia seeds for dietary management of constipation Schedule upper GI series for further investigation of upper abdominal discomfort -if this yields findings that warrant upper endoscopy will reevaluate scheduling a procedure Continue Nexium  20 mg orally daily 20 to 30 minutes before meal -could consider adding an H2 blocker at night in the future if needed GERD diet and lifestyle modification No longer under colon polyp surveillance based upon age  Planned Follow Up 3-4 months  The patient or caregiver verbalized understanding of the material covered, with no barriers to understanding. All questions were answered. Patient or caregiver is agreeable with the plan outlined above.    It was a pleasure to see Cindy Schwartz.  If you have any questions or concerns regarding this evaluation, do not hesitate to contact me.  Inocente Hausen, MD [facility]   I spent total of 30 minutes in both face-to-face (15 minutes interview) and non-face-to-face (15 minutes chart review, care coordination, documentation)  activities, excluding procedures performed, for the visit on the date of this encounter.

## 2024-06-13 ENCOUNTER — Encounter: Payer: Self-pay | Admitting: Pediatrics

## 2024-06-13 ENCOUNTER — Ambulatory Visit (INDEPENDENT_AMBULATORY_CARE_PROVIDER_SITE_OTHER): Admitting: Pediatrics

## 2024-06-13 VITALS — BP 102/60 | HR 70 | Ht 65.0 in | Wt 135.0 lb

## 2024-06-13 DIAGNOSIS — K5900 Constipation, unspecified: Secondary | ICD-10-CM | POA: Diagnosis not present

## 2024-06-13 DIAGNOSIS — R140 Abdominal distension (gaseous): Secondary | ICD-10-CM

## 2024-06-13 DIAGNOSIS — K219 Gastro-esophageal reflux disease without esophagitis: Secondary | ICD-10-CM

## 2024-06-13 DIAGNOSIS — R142 Eructation: Secondary | ICD-10-CM

## 2024-06-13 DIAGNOSIS — Z860101 Personal history of adenomatous and serrated colon polyps: Secondary | ICD-10-CM | POA: Diagnosis not present

## 2024-06-13 DIAGNOSIS — R1013 Epigastric pain: Secondary | ICD-10-CM

## 2024-06-13 MED ORDER — ESOMEPRAZOLE MAGNESIUM 20 MG PO CPDR: 20.0000 mg | DELAYED_RELEASE_CAPSULE | Freq: Every day | ORAL | 3 refills | Status: DC

## 2024-06-13 NOTE — Therapy (Unsigned)
 OUTPATIENT PHYSICAL THERAPY NOTE Discharge   Patient Name: Cindy Schwartz MRN: [id number] DOB:16-Aug-1934, 89 y.o., female Today's Date: 06/14/2024    PHYSICAL THERAPY DISCHARGE SUMMARY  Visits from Start of Care: 5  Current functional level related to goals / functional outcomes: See below    Remaining deficits: Pain    Education / Equipment: HEP, body mechanics, posture, sx mgmt    Patient agrees to discharge. Patient goals were partially met. Patient is being discharged due to the patient's request.    END OF SESSION:  PT End of Session - 06/14/24 1027     Visit Number 5    Number of Visits 12    Date for Recertification  07/19/24    Authorization Type [employer] Medicare, Generic commercial    PT Start Time 1023    PT Stop Time 1053    PT Time Calculation (min) 30 min    Activity Tolerance Patient tolerated treatment well    Behavior During Therapy WFL for tasks assessed/performed             Past Medical History:  Diagnosis Date   Aneurysm of ascending aorta    a. 4.2-4.3cm in 2014.   Anxiety    Arthritis    Breast cancer (HCC) 2018   right mastectomy   Cancer (HCC) 1980   ovarian cancer   Compression fracture of L1 lumbar vertebra (HCC)    COMPRESSION FRACTURE, THORACIC VERTEBRA 07/24/2010   Qualifier: Diagnosis of  By: [name], [name]    Elevated BP Transient 09/2014   Genetic testing 07/09/2017   Multi-Cancer panel (83 genes) @ [facility] - No pathogenic mutations detected   GERD (gastroesophageal reflux disease)    H/O bone density study    2015   H/O colonoscopy    History of ovarian cancer    November 1979   History of ovarian cancer 1979   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   Insomnia    Irregular heart beat    Normal coronary arteries Cath - 09/2014   Osteoporosis    Raynaud's disease    Past Surgical History:  Procedure Laterality Date   ABDOMINAL HYSTERECTOMY  1979   APPENDECTOMY     BILATERAL  SALPINGOOPHORECTOMY     BREAST LUMPECTOMY     COLONOSCOPY  02/2012   LEFT HEART CATHETERIZATION WITH CORONARY ANGIOGRAM N/A 10/17/2014   Procedure: LEFT HEART CATHETERIZATION WITH CORONARY ANGIOGRAM;  Surgeon: Debby DELENA Sor, MD;  Location: [facility] CATH LAB;  Service: Cardiovascular;  Laterality: N/A;   MASTECTOMY Right 2018   no chemo no radiation no tamoxifen   ovarian ca s/p taj/bso  1979   SIMPLE MASTECTOMY WITH AXILLARY SENTINEL NODE BIOPSY Right 05/05/2017   Procedure: RIGHT TOTAL MASTECTOMY WITH RIGHT  AXILLARY SENTINEL NODE BIOPSY;  Surgeon: Ebbie Cough, MD;  Location: [facility] OR;  Service: General;  Laterality: Right;  PECTORAL BLOCK   Patient Active Problem List   Diagnosis Date Noted   Lumbar stenosis 01/08/2023   Bloating 07/31/2022   Left sided abdominal pain 07/31/2022   Belching 07/31/2022   COPD (chronic obstructive pulmonary disease) (HCC) 03/17/2022   Osteoarthritis of carpometacarpal (CMC) joints of both thumbs 09/26/2021   Pain due to onychomycosis of toenails of both feet 12/25/2020   History of cerebellar stroke 10/05/2020   Memory loss 10/05/2020   Osteoarthritis of both hands 08/29/2020   Bilateral carpal tunnel syndrome 08/29/2020   Trigger thumb of left hand 08/29/2020   History of adenomatous polyp of colon  12/30/2019   Mild hyperlipidemia 06/01/2019   Degenerative arthritis of knee, bilateral 04/08/2019   Chronic idiopathic constipation 03/01/2019   Chronic cough 01/21/2018   Genetic testing 07/09/2017   history of Breast cancer of lower-outer quadrant of right female breast  01/16/2016   Palpitations 11/13/2014   Normal coronary arteries 10/17/2014   Varicose veins of bilateral lower extremities with other complications 03/09/2012   Hx of herpes simplex infection 12/18/2011   Low back pain 06/28/2010   Lower abdominal pain 06/28/2010   Generalized anxiety disorder 10/13/2008   Irritable bowel syndrome 10/13/2008   Lung nodule 11/25/2007   Raynaud's  syndrome 07/01/2007   Aneurysm of thoracic aorta 05/06/2007   Allergic rhinitis 05/06/2007   Esophageal reflux 05/06/2007   DEGENERATIVE DISC DISEASE, CERVICAL SPINE 05/06/2007   Osteoporosis 03/18/2007   History of ovarian cancer 09/22/1977    PCP: [name] MD   REFERRING PROVIDER: Leonce Katz, DO  REFERRING DIAG:  Diagnosis  M54.42,M54.41,G89.29 (ICD-10-CM) - Chronic bilateral low back pain with bilateral sciatica  M48.062 (ICD-10-CM) - Spinal stenosis of lumbar region with neurogenic claudication    Rationale for Evaluation and Treatment: Rehabilitation  THERAPY DIAG:  Radiculopathy, lumbar region  Abnormal posture  Other low back pain  Difficulty in walking, not elsewhere classified  Muscle weakness (generalized)  Pain in right lower leg  ONSET DATE: chronic  SUBJECTIVE:                                                                                                                                                                                           SUBJECTIVE STATEMENT: Patient is now ready to   PERTINENT HISTORY:  MD note Dr. Leonce: Chronic with exacerbation, subsequent visit - Continued years of low back pain with several months of worsening back pain and worsening radicular symptoms, left > Right.  Consistent with flare of significant degenerative changes in lumbar spine as seen on prior lumbar MRI - Lumbar MRI 06/16/22 shows multifactorial severe spinal canal stenosis with cauda equina nerve root impingement at L3-L4, moderate left neural foraminal stenosis, impingement of left L5 nerve root at L4-L5, facet arthropathy at L5-S1 with potential irritation of S1 nerve root bilaterally, angled chronic compression deformities of T12 and L1 - Patient wishes to proceed with conservative therapy and is not interested in epidural CSI at this time - Start meloxicam  7.5 mg daily x2 weeks.  If still having pain after 2 weeks, complete 3rd-week of NSAID.  May use remaining NSAID as needed once daily for pain control.  Do not to use additional over-the-counter NSAIDs (ibuprofen, naproxen, Advil, Aleve, etc.) while taking prescription NSAIDs.  May use Tylenol  [id number] mg 2 to 3 times a day for breakthrough pain.  -Start HEP and physical therapy for low back  PAIN:  Are you having pain? Yes: NPRS scale: 7/10  Pain usually 7/10-8/10 PM, has to recline during the day 1-2, Rt LE   Pain location: low back  Pain description: heavy, aching , tingling  Aggravating factors: Activity,  Relieving factors: heat and massage  Legs no longer tingling.    PRECAUTIONS: None  RED FLAGS: None   WEIGHT BEARING RESTRICTIONS: No  FALLS:  Has patient fallen in last 6 months? No Does feel like her balance has declined   LIVING ENVIRONMENT: Lives with: lives alone Lives in: House/apartment Stairs: Yes: Internal: 15 steps; on right going up3-4 STE Has following equipment at home: None  OCCUPATION: retired- Pensions consultant, Airline pilot, schools  widow 4 yrs ago  PLOF: Independent  PATIENT GOALS: Better posture , get up easier   NEXT MD VISIT: as needed   OBJECTIVE:  Note: Objective measures were completed at Evaluation unless otherwise noted.  DIAGNOSTIC FINDINGS:  MRI Lumbar 05/2022 IMPRESSION: 1. Multifactorial severe spinal canal stenosis with cauda equina nerve root impingement at L3-L4. Moderate left neural foraminal stenosis at this level. Additionally, there is edema in the left L4 posterior elements, likely degenerative/reactive in nature which may reflect a source of pain. 2. At L2-L3, there is mild-to-moderate spinal canal stenosis and moderate right neural foraminal stenosis with possible contact of the extraforaminal L2 nerve root. 3. Moderate spinal canal stenosis at L4-L5 with left worse than right subarticular zone effacement and probable impingement of the traversing left L5 nerve root, and mild bilateral neural  foraminal stenosis. 4. Moderate bilateral facet arthropathy with small effusions at L5-S1 contributes to bilateral subarticular zone narrowing and potential irritation of either traversing S1 nerve root and mild-to-moderate bilateral neural foraminal stenosis. 5. Unchanged chronic compression deformities of the T12 and L1 vertebral bodies. No evidence of acute injury.  PATIENT SURVEYS:  Modified Oswestry:  Total (eval) 25/50  06/14/24: 22/50, 44%  Minimally Clinically Important Difference (MCID) = 12.8%  COGNITION: Overall cognitive status: Within functional limits for tasks assessed     SENSATION: WFL  MUSCLE LENGTH: Hamstrings:tight, passive SLR to 50 deg bilateral no back pain  Tight quads testing in prone   POSTURE: rounded shoulders, forward head, increased thoracic kyphosis, and flexed trunk   PALPATION: Grossly pain and sore low lumbar and across bilateral gluteals   LUMBAR ROM:   AROM eval  Flexion NT due to Osteoporosis  Extension 50% LE discomfort   Right lateral flexion 25%   Left lateral flexion 25%   Right rotation Pain Rt  Left rotation Stiff, less pain than Rt   (Blank rows = not tested)  LOWER EXTREMITY ROM:     Passive  Right eval Left eval  Hip flexion    Hip extension    Hip abduction    Hip adduction    Hip internal rotation Tight  tight  Hip external rotation    Knee flexion    Knee extension    Ankle dorsiflexion    Ankle plantarflexion    Ankle inversion    Ankle eversion     (Blank rows = not tested)  LOWER EXTREMITY MMT:    MMT Right eval Left eval  Hip flexion 4+ 4+  Hip extension 3+ 3+  Hip abduction 4+ 4+  Hip adduction    Hip internal rotation    Hip external rotation  Knee flexion 4+ 4+  Knee extension 4+ 4+  Ankle dorsiflexion 5 5  Ankle plantarflexion    Ankle inversion    Ankle eversion     (Blank rows = not tested)  LUMBAR SPECIAL TESTS:  Straight leg raise test: Negative  FUNCTIONAL TESTS:  NT    06/02/24: 5 X STS= 16. 4 sec   30 sec STS= 9 sec   GAIT: Distance walked: 150 Assistive device utilized: None Level of assistance: Modified independence Comments: slow pace  TREATMENT DATE:   [facility] Adult PT Treatment:                                                DATE: 06/14/24  Self Care: Discussed goals, POC and injections, MD folow up  Hip hinging for home tasks, body nmehcanics  Review of HEP with 3-4 reps done of each , more verbal than exercises performed     [facility] Adult PT Treatment:                                                DATE: 06/08/24 Therapeutic Activity: Abdominal bracing with coordinated breath Shoulder ext GTB x 10  Added march x 5 each side Ball bridge knees bent x 10 HS curl with ball  LTR with ball   Knee to chest  Banded clam GTB  x 15  Hip abduction x 15  Hamstring/ITB strap x 3    [facility] Adult PT Treatment:                                                DATE: 06/06/24 Therapeutic Exercise: Recumbent bike 6 min level 1  Standing exercises at wall, cues for posture  Wall squat x 10  Wall sit Squat to chair x 10 , cues for hip flex/hinge Hip abduction red band  Sidelying clam red band  Supine bridge  Bridge with march   Single and double knee to chest   [facility] Adult PT Treatment:                                                DATE: 06/02/24 Therapeutic Exercise: Supine A/P tilt added breathing for core Knee to chest double and single Hamstring 3 way  x 1 each LE  LTR x 10  Therapeutic Activity: Functional testing STS  Step up with hip flex 1 UE  SLS  trials each LE x 5 Hip abduction  10 lbs chest with high knees  Suitcase hold with march      [facility] Adult PT Treatment:                                                DATE: 05/24/24 TSelf Care: POC, daily morning routine Flexion/rotation precautions and body mechanics Role of manual/modalities in PT recovery  PATIENT EDUCATION:  Education details: see above  Person educated: Patient Education method: Explanation, Demonstration, and Handouts Education comprehension: verbalized understanding and returned demonstration  HOME EXERCISE PROGRAM: Access Code: [facility] URL: [email or web address] Date: 06/02/2024 Prepared by: Delon Norma  Exercises - Supine Single Knee to Chest Stretch  - 1 x daily - 7 x weekly - 2 sets - 3-5 reps - 30 hold - Supine Lower Trunk Rotation  - 1 x daily - 7 x weekly - 2 sets - 10 reps - 10 hold - Supine Double Knee to Chest  - 1 x daily - 7 x weekly - 1 sets - 5 reps - 30 hold - Supine Hamstring Stretch with Strap  - 1 x daily - 7 x weekly - 1 sets - 3 reps - 30 hold - Standing Shoulder Horizontal Abduction with Resistance  - 1 x daily - 7 x weekly - 2 sets - 10 reps - 5 hold - Sit to Stand Without Arm Support  - 1 x daily - 7 x weekly - 2 sets - 10 reps   ASSESSMENT:   CLINICAL IMPRESSION:  Patient with increased pain in her back and Rt leg today.  Chose to keep her exercises to the mat and focus on core stability and control today. Patient will continue to benefit from skilled PT in order to return to PLOF and optimize functional mobility.       OBJECTIVE IMPAIRMENTS: decreased activity tolerance, decreased balance, decreased endurance, decreased mobility, difficulty walking, decreased ROM, decreased strength, hypomobility, increased fascial restrictions, impaired flexibility, impaired sensation, improper body mechanics, postural dysfunction, and pain.   ACTIVITY LIMITATIONS: carrying, lifting, bending, sitting, standing, squatting, sleeping, stairs, and locomotion level  PARTICIPATION LIMITATIONS: meal prep, cleaning, laundry, shopping, community activity, and yard work  PERSONAL FACTORS: Time since onset of injury/illness/exacerbation and 3+ comorbidities: age, chronic back issues, osteoporosis, hx breast  cancer are also affecting patient's functional outcome.   REHAB POTENTIAL: Good  CLINICAL DECISION MAKING: Stable/uncomplicated  EVALUATION COMPLEXITY: Low   GOALS: Goals reviewed with patient? Yes  SHORT TERM GOALS: Target date: 06/07/2024    Patient will be able to show independence for initial HEP to include posture, core and hip strength and stability.   Baseline: Goal status: MET   2.  Pt will be shown proper hip hinge to begin using with home tasks, require no more than min cues  Baseline:  Goal status: MET   LONG TERM GOALS: Target date: 07/19/2024    Pt will be I with final HEP upon discharge from PT for back, core, posture.  Baseline:  Goal status: MET   2.  Pt will be able to stand for light home tasks with no more than min increase in back/leg pain for 10-15 min at a time.  Baseline:  Goal status:NOT MET   3.  Patient will be able to demonstrate proper posture and lifting techniques related to spine health and reduction of symptoms.   Baseline:  Goal status: MET   4.  Pt will be able to report ability to kneel and get back up to stand for home tasks using furniture and improved confidence Baseline:  Goal status: PARTIALLY MET   5.  Further functional goals TBA  Baseline:  Goal status: DC    PLAN:  PT FREQUENCY: 1-2x/week  PT DURATION: 8 weeks  PLANNED INTERVENTIONS: 97164- PT Re-evaluation, 97750- Physical Performance Testing, 97110-Therapeutic exercises, 97530- Therapeutic activity, [id number]- Neuromuscular re-education, 97535- Self Care, [id number]- Manual therapy, [id number]- Gait training,  Patient/Family education, Balance training, Joint mobilization, Spinal mobilization, Cryotherapy, and Moist heat.  PLAN FOR NEXT SESSION: NA, DC    Tyanna Hach, PT 06/14/2024, 11:03 AM  Delon Norma, PT 06/14/24 11:03 AM Phone: [phone] Fax: [phone]

## 2024-06-13 NOTE — Patient Instructions (Signed)
 We have sent the following medications to your pharmacy for you to pick up at your convenience: Nexium  20mg  once daily.  You have been scheduled for an Upper GI Series at [facility]. Your appointment is on Thursday, October 2 at 10:00 am. Please arrive 30 minutes prior to your test for registration. Make sure not to eat or drink anything after midnight on the night before your test. If you need to reschedule, please call radiology at [phone]. ________________________________________________________________ An upper GI series uses x rays to help diagnose problems of the upper GI tract, which includes the esophagus, stomach, and duodenum. The duodenum is the first part of the small intestine. An upper GI series is conducted by a radiology technologist or a radiologist--a doctor who specializes in x-ray imaging--at a hospital or outpatient center. While sitting or standing in front of an x-ray machine, the patient drinks barium liquid, which is often white and has a chalky consistency and taste. The barium liquid coats the lining of the upper GI tract and makes signs of disease show up more clearly on x rays. X-ray video, called fluoroscopy, is used to view the barium liquid moving through the esophagus, stomach, and duodenum. Additional x rays and fluoroscopy are performed while the patient lies on an x-ray table. To fully coat the upper GI tract with barium liquid, the technologist or radiologist may press on the abdomen or ask the patient to change position. Patients hold still in various positions, allowing the technologist or radiologist to take x rays of the upper GI tract at different angles. If a technologist conducts the upper GI series, a radiologist will later examine the images to look for problems.  This test typically takes about 1 hour to complete. __________________________________________________________________   Follow up in 3-4 months or sooner if needed.  Thank you  for entrusting me with your care and for choosing [facility], Dr. Inocente Hausen  _______________________________________________________  If your blood pressure at your visit was 140/90 or greater, please contact your primary care physician to follow up on this.  _______________________________________________________  If you are age 88 or older, your body mass index should be between 23-30. Your Body mass index is 22.47 kg/m. If this is out of the aforementioned range listed, please consider follow up with your Primary Care Provider.  If you are age 54 or younger, your body mass index should be between 19-25. Your Body mass index is 22.47 kg/m. If this is out of the aformentioned range listed, please consider follow up with your Primary Care Provider.   ________________________________________________________  The [place] [facility] providers would like to encourage you to use MYCHART to communicate with providers for non-urgent requests or questions.  Due to long hold times on the telephone, sending your provider a message by [facility] may be a faster and more efficient way to get a response.  Please allow 48 business hours for a response.  Please remember that this is for non-urgent requests.  _______________________________________________________  [facility] is using a team-based approach to care.  Your team is made up of your doctor and two to three APPS. Our APPS (Nurse Practitioners and Physician Assistants) work with your physician to ensure care continuity for you. They are fully qualified to address your health concerns and develop a treatment plan. They communicate directly with your gastroenterologist to care for you. Seeing the Advanced Practice Practitioners on your physician's team can help you by facilitating care more promptly, often allowing for earlier appointments, access to  diagnostic testing, procedures, and other specialty referrals.

## 2024-06-14 ENCOUNTER — Telehealth: Payer: Self-pay | Admitting: Physical Medicine and Rehabilitation

## 2024-06-14 ENCOUNTER — Encounter: Payer: Self-pay | Admitting: Physical Therapy

## 2024-06-14 ENCOUNTER — Telehealth: Payer: Self-pay

## 2024-06-14 ENCOUNTER — Ambulatory Visit (INDEPENDENT_AMBULATORY_CARE_PROVIDER_SITE_OTHER): Admitting: Physical Therapy

## 2024-06-14 DIAGNOSIS — M79661 Pain in right lower leg: Secondary | ICD-10-CM

## 2024-06-14 DIAGNOSIS — M5459 Other low back pain: Secondary | ICD-10-CM

## 2024-06-14 DIAGNOSIS — R293 Abnormal posture: Secondary | ICD-10-CM

## 2024-06-14 DIAGNOSIS — M5416 Radiculopathy, lumbar region: Secondary | ICD-10-CM | POA: Diagnosis not present

## 2024-06-14 DIAGNOSIS — M6281 Muscle weakness (generalized): Secondary | ICD-10-CM

## 2024-06-14 DIAGNOSIS — R262 Difficulty in walking, not elsewhere classified: Secondary | ICD-10-CM | POA: Diagnosis not present

## 2024-06-14 NOTE — Telephone Encounter (Signed)
 Patient would like an appointment with Dr. Eldonna for back injection.

## 2024-06-14 NOTE — Telephone Encounter (Signed)
 SABRA

## 2024-06-15 ENCOUNTER — Ambulatory Visit: Admitting: Sports Medicine

## 2024-06-18 ENCOUNTER — Emergency Department (HOSPITAL_BASED_OUTPATIENT_CLINIC_OR_DEPARTMENT_OTHER)
Admission: EM | Admit: 2024-06-18 | Discharge: 2024-06-18 | Disposition: A | Discharge status: Home / Self Care | Attending: Emergency Medicine | Admitting: Emergency Medicine

## 2024-06-18 ENCOUNTER — Other Ambulatory Visit: Payer: Self-pay

## 2024-06-18 ENCOUNTER — Encounter (HOSPITAL_BASED_OUTPATIENT_CLINIC_OR_DEPARTMENT_OTHER): Payer: Self-pay

## 2024-06-18 ENCOUNTER — Emergency Department (HOSPITAL_BASED_OUTPATIENT_CLINIC_OR_DEPARTMENT_OTHER)

## 2024-06-18 DIAGNOSIS — K5730 Diverticulosis of large intestine without perforation or abscess without bleeding: Secondary | ICD-10-CM | POA: Diagnosis not present

## 2024-06-18 DIAGNOSIS — Z7982 Long term (current) use of aspirin: Secondary | ICD-10-CM | POA: Insufficient documentation

## 2024-06-18 DIAGNOSIS — R0789 Other chest pain: Secondary | ICD-10-CM | POA: Diagnosis not present

## 2024-06-18 DIAGNOSIS — R079 Chest pain, unspecified: Secondary | ICD-10-CM | POA: Diagnosis present

## 2024-06-18 LAB — CBC
HCT: 39.6 % (ref 36.0–46.0)
Hemoglobin: 13.1 g/dL (ref 12.0–15.0)
MCH: 28.2 pg (ref 26.0–34.0)
MCHC: 33.1 g/dL (ref 30.0–36.0)
MCV: 85.2 fL (ref 80.0–100.0)
Platelets: 244 K/uL (ref 150–400)
RBC: 4.65 MIL/uL (ref 3.87–5.11)
RDW: 15 % (ref 11.5–15.5)
WBC: 6.2 K/uL (ref 4.0–10.5)
nRBC: 0 % (ref 0.0–0.2)

## 2024-06-18 LAB — TROPONIN T, HIGH SENSITIVITY
Troponin T High Sensitivity: <15 ng/L (ref 0–19)
Troponin T High Sensitivity: <15 ng/L (ref 0–19)

## 2024-06-18 LAB — BASIC METABOLIC PANEL WITH GFR
Anion gap: 11 (ref 5–15)
BUN: 13 mg/dL (ref 8–23)
CO2: 26 mmol/L (ref 22–32)
Calcium: 10 mg/dL (ref 8.9–10.3)
Chloride: 95 mmol/L — ABNORMAL LOW (ref 98–111)
Creatinine, Ser: 0.61 mg/dL (ref 0.44–1.00)
GFR, Estimated: >60 mL/min (ref >60)
Glucose, Bld: 95 mg/dL (ref 70–99)
Potassium: 3.8 mmol/L (ref 3.5–5.1)
Sodium: 131 mmol/L — ABNORMAL LOW (ref 135–145)

## 2024-06-18 MED ORDER — IOHEXOL 350 MG/ML SOLN
100.0000 mL | Freq: Once | INTRAVENOUS | Status: AC | PRN
  Administered 2024-06-18: 75 mL via INTRAVENOUS

## 2024-06-18 NOTE — ED Triage Notes (Signed)
 Pt reports sudden onset of L sided chest discomfort at bedtime last night, ongoing and worsening throughout the night into today. Denies radiation. Denies SHOB. Reports hx palpitations. Reports took 2 baby aspirin  last night with no relief.

## 2024-06-18 NOTE — ED Provider Notes (Signed)
 [place] EMERGENCY DEPARTMENT AT [facility] Provider Note   CSN: [id number] Arrival date & time: 06/18/24  1118     Patient presents with: Chest Discomfort   Cindy Schwartz is a 89 y.o. female.   Patient with a strange feeling in her chest that started yesterday and then kind of went in through the night.  Still there this morning no real pain according to patient not even really discomfort.  It is more left-sided it was there at bedtime patient is concerned that something is not right.  Also some questions of palpitations.  Patient took 2 baby aspirin  without any relief.  Patient followed by Dr. Admission from cardiology.  Patient has a known history of an aneurysm ascending aorta last imaged in June.  At that time was 4.2 cm.  Known gastroesophageal reflux disease had a cath in 2016 with normal coronary arteries.  History of ovarian cancer in 76.  History of an irregular heartbeat.  Patient is never used tobacco products.       Prior to Admission medications   Medication Sig Start Date End Date Taking? Authorizing Provider  Ascorbic Acid (VITAMIN C) 1000 MG tablet Take 1,000 mg by mouth daily.    [provider]  aspirin  EC (ASPIRIN  LOW DOSE) 81 MG tablet TAKE 1 TABLET DAILY (SWALLOW WHOLE) 06/30/23   [name], MD  Calcium  Carbonate (CALCIUM  500 PO) Take by mouth.    [provider]  Cholecalciferol 25 MCG (1000 UT) capsule Take by mouth. Patient taking differently: Take by mouth. Takes in the winter months    [provider]  COLLAGEN PO Take by mouth.    [provider]  esomeprazole  (NEXIUM ) 20 MG capsule Take 1 capsule (20 mg total) by mouth daily at 12 noon. 06/13/24   Suzann Inocente HERO, MD  gabapentin  (NEURONTIN ) 100 MG capsule Take 100 mg by mouth 2 (two) times daily. 04/29/24   [provider]  ketoconazole (NIZORAL) 2 % cream APPLY TOPICALLY TO EYEBROWS TWICE DAILY 02/26/24   [provider]  MAGNESIUM  PO Take  300 mg by mouth.    [provider]  meloxicam  (MOBIC ) 7.5 MG tablet Take 1 tablet (7.5 mg total) by mouth daily. Patient not taking: Reported on 05/24/2024 05/04/24   Leonce Katz, DO  nitroGLYCERIN  (NITROSTAT ) 0.4 MG SL tablet Place 1 tablet (0.4 mg total) under the tongue every 5 (five) minutes as needed for chest pain. 01/21/23   Lucien Orren SAILOR, PA-C  Probiotic Product (PROBIOTIC PO) Take 1 capsule by mouth daily.    [provider]  rosuvastatin  (CRESTOR ) 5 MG tablet Take 1 tablet (5 mg total) by mouth daily. Take 1 tablet daily by mouth. 06/30/23   [name], MD  valACYclovir  (VALTREX ) 500 MG tablet Take 1 tablet (500 mg total) by mouth 2 (two) times a week. 07/02/23   [name], MD    Allergies: Chlorhexidine , Prednisone , and Chlorhexidine  gluconate    Review of Systems  Constitutional:  Negative for chills and fever.  HENT:  Negative for ear pain and sore throat.   Eyes:  Negative for pain and visual disturbance.  Respiratory:  Negative for cough and shortness of breath.   Cardiovascular:  Positive for chest pain. Negative for palpitations.  Gastrointestinal:  Negative for abdominal pain and vomiting.  Genitourinary:  Negative for dysuria and hematuria.  Musculoskeletal:  Negative for arthralgias and back pain.  Skin:  Negative for color change and rash.  Neurological:  Negative for seizures and syncope.  All other systems reviewed and are negative.   Updated Vital Signs BP (!) 161/86   Pulse 67   Temp 98.6 F (37 C) (Oral)   Resp 16   Ht 1.651 m (5' 5)   Wt 61.2 kg   LMP  (LMP Unknown)   SpO2 96%   BMI 22.47 kg/m   Physical Exam Vitals and nursing note reviewed.  Constitutional:      General: She is not in acute distress.    Appearance: Normal appearance. She is well-developed. She is not ill-appearing.  HENT:     Head: Normocephalic and atraumatic.     Mouth/Throat:     Mouth: Mucous membranes are moist.  Eyes:      Extraocular Movements: Extraocular movements intact.     Conjunctiva/sclera: Conjunctivae normal.     Pupils: Pupils are equal, round, and reactive to light.  Cardiovascular:     Rate and Rhythm: Normal rate and regular rhythm.     Heart sounds: No murmur heard. Pulmonary:     Effort: Pulmonary effort is normal. No respiratory distress.     Breath sounds: Normal breath sounds. No wheezing, rhonchi or rales.  Abdominal:     Palpations: Abdomen is soft.     Tenderness: There is no abdominal tenderness.  Musculoskeletal:        General: No swelling.     Cervical back: Normal range of motion and neck supple. No rigidity.  Skin:    General: Skin is warm and dry.     Capillary Refill: Capillary refill takes less than 2 seconds.  Neurological:     General: No focal deficit present.     Mental Status: She is alert and oriented to person, place, and time.  Psychiatric:        Mood and Affect: Mood normal.     (all labs ordered are listed, but only abnormal results are displayed) Labs Reviewed  BASIC METABOLIC PANEL WITH GFR - Abnormal; Notable for the following components:      Result Value   Sodium 131 (*)    Chloride 95 (*)    All other components within normal limits  CBC  TROPONIN T, HIGH SENSITIVITY  TROPONIN T, HIGH SENSITIVITY    EKG: EKG Interpretation Date/Time:  Saturday June 18 2024 11:30:22 EDT Ventricular Rate:  77 PR Interval:  217 QRS Duration:  139 QT Interval:  441 QTC Calculation: 500 R Axis:   250  Text Interpretation: Sinus rhythm Borderline prolonged PR interval Probable left atrial enlargement RBBB and LAFB ST elevation, consider inferior injury Confirmed by Dulcy Sida [phone]) on 06/18/2024 11:36:01 AM  Radiology: [facility] Chest Port 1 View Result Date: 06/18/2024 EXAM: 1 VIEW(S) XRAY OF THE CHEST 06/18/2024 12:26:00 PM COMPARISON: 02/01/2024 CLINICAL HISTORY: C/o Lt sided chest pain began last night and worsened today. Hx Rt mastectomy/breast  cancer FINDINGS: LUNGS AND PLEURA: Hyperinflation. Stable biapical scarring. No focal pulmonary opacity. No pulmonary edema. No pleural effusion. No pneumothorax. HEART AND MEDIASTINUM: Aortic arch calcifications. No acute abnormality of the cardiac and mediastinal silhouettes. BONES AND SOFT TISSUES: Surgical clips in RIGHT axillary region. Osteopenia. No acute osseous abnormality. IMPRESSION: 1. No acute cardiopulmonary process. 2. Stable biapical scarring. Electronically signed by: Waddell Calk MD 06/18/2024 12:32 PM EDT RP Workstation: [id number]     Procedures   Medications Ordered in the ED - No data to display  Medical Decision Making Amount and/or Complexity of Data Reviewed Labs: ordered. Radiology: ordered.  Risk Prescription drug management.   Basic metabolic panel sodium 131 potassium 3.8 chloride 95 GFR greater than 60 creatinine 0.61.  CBC white count 6.2 hemoglobin 13.1 platelets 244 initial troponin less than 15 portable chest without any acute findings.  Patient does have a history of a ascending thoracic aortic aneurysm it was last screened in June and it was 4.2 cm at that time.  Based on this we will go ahead and do a dissection study.  Patient's renal function is good enough for that.  Patient will need delta troponin.  And will continue cardiac monitoring.   Heart monitoring without any arrhythmias.  Troponins x 2 were less than 15.  Basic metabolic panel sodium 131 but not real different than usual.  Renal function normal.  CBC white count 6.2 as mentioned above.  CT angio chest abdomen pelvis the ascending aneurysm still 4.2 cm no other acute findings.  Patient stable for discharge home close follow-up with cardiology   Final diagnoses:  Atypical chest pain    ED Discharge Orders     None          Geraldene Hamilton, MD 06/18/24 [phone]

## 2024-06-18 NOTE — ED Notes (Signed)
 Patient transported to CT

## 2024-06-18 NOTE — Discharge Instructions (Signed)
 Make an appointment to follow-up with cardiology.  Today's workup troponins negative and CT dissection study negative as well.  Return for any new or worse symptoms.

## 2024-06-20 ENCOUNTER — Telehealth: Payer: Self-pay | Admitting: Cardiovascular Disease

## 2024-06-20 DIAGNOSIS — R079 Chest pain, unspecified: Secondary | ICD-10-CM

## 2024-06-20 NOTE — Telephone Encounter (Signed)
 Called patient back. 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 Claiborne. She had to go to the ED over the weekend due to chest pain. ED physican would like patient to follow-up with cardiology. Dr. Claiborne first available is in November. Patient does not want to see APP. Patient would like Dr. Delford to review ED notes and let her know if she needs to be seen. Patient is going out of town next month for a special annual celebration and going out of country to [place] in November. Patient wants to make sure she is safe to travel, as well. Will forward to Dr. Nishan for advisement.

## 2024-06-20 NOTE — Telephone Encounter (Signed)
 Left message to call back.

## 2024-06-20 NOTE — Telephone Encounter (Signed)
 Pt requesting a c/b in regards to her hospital visit.

## 2024-06-20 NOTE — Telephone Encounter (Signed)
 Patient returned call

## 2024-06-21 NOTE — Telephone Encounter (Signed)
 Called patient. Informed patient Dr Delford  would like for her to have [facility]   Verbal instruction given, patient voiced understanding. Written instruction and information Cardiac Nuclear Scan have been mailed.  Direction of new address [address] .       Your doctor has scheduled you for a Myocardial Perfusion scan on to obtain information about the blood flow to your heart. The test consists of taking pictures of your heart in two phases: while resting and after a stress test.  The stress test may involve walking on a treadmill, or if you are unable to exercise adequately, you will be given a drug intended to have a similar effect on the heart to that of exercise.  The test will take approximately 3 to 4  hours to complete.   How to prepare for your test: Do not eat or drink 2 hours prior to your test Do not consume products containing caffeine 12 hours prior to your test (examples: coffee (regular OR decaf), chocolate, sodas, tea) Your doctor may need you to hold certain medications prior to the test.  If so, these are listed below and should not be taken for 24 hours prior to the test.  If not listed below, you may take your medications as normal.  You may resume taking held medications on your normal schedule once the test is complete.   Meds to hold:  none Do bring a list of your current medications with you.  If you have held any meds in preparation for the test, please bring them, as you may be required to take them once the test is completed. Do wear comfortable clothes and walking shoes.  Do not wear dresses or overalls. Do NOT wear cologne, perfume, aftershave, or fragranced lotions the day of your test (deodorants okay). If these instructions are not followed your test will have to be rescheduled.   A nuclear cardiologist will review your test, prepare a report and send it to your physician.   If you have questions or concerns about your appointment, you can call the  Nuclear Cardiology department at [phone]. If you cannot keep your appointment, please provide 48 hours notification to avoid a possible $50.00 charge to your account.   Please arrive 15 minutes prior to your appointment time for registration and insurance purposes

## 2024-06-21 NOTE — Telephone Encounter (Signed)
 He ECG and troponins looked fine in ER she has no documented clinical CAD. Can order a lexiscan myovue for this week or next.  Delford Maude BROCKS, MD 06/20/24  6:06 PM   .

## 2024-06-22 ENCOUNTER — Other Ambulatory Visit: Payer: Self-pay | Admitting: Cardiovascular Disease

## 2024-06-22 ENCOUNTER — Telehealth (HOSPITAL_COMMUNITY): Payer: Self-pay | Admitting: *Deleted

## 2024-06-22 DIAGNOSIS — R079 Chest pain, unspecified: Secondary | ICD-10-CM

## 2024-06-22 NOTE — Telephone Encounter (Signed)
 Patient given detailed instructions per Myocardial Perfusion Study Information Sheet for the test on 06/27/2024 at 10:45. Patient notified to arrive 15 minutes early and that it is imperative to arrive on time for appointment to keep from having the test rescheduled.  If you need to cancel or reschedule your appointment, please call the office within 24 hours of your appointment. . Patient verbalized understanding.Cindy Schwartz

## 2024-06-22 NOTE — Addendum Note (Signed)
 Addended by: GLADIS REENA GAILS on: 06/22/2024 06:19 PM   Modules accepted: Orders

## 2024-06-23 ENCOUNTER — Ambulatory Visit (HOSPITAL_COMMUNITY)

## 2024-06-23 NOTE — Addendum Note (Signed)
 Addended by: DELFORD MAUDE BROCKS on: 06/23/2024 03:44 PM   Modules accepted: Orders

## 2024-06-27 ENCOUNTER — Ambulatory Visit: Payer: Self-pay | Admitting: Cardiovascular Disease

## 2024-06-27 ENCOUNTER — Ambulatory Visit (HOSPITAL_COMMUNITY)
Admission: RE | Admit: 2024-06-27 | Discharge: 2024-06-27 | Disposition: A | Discharge status: Home / Self Care | Source: Ambulatory Visit | Attending: Internal Medicine | Admitting: Internal Medicine

## 2024-06-27 DIAGNOSIS — R079 Chest pain, unspecified: Secondary | ICD-10-CM | POA: Insufficient documentation

## 2024-06-27 LAB — MYOCARDIAL PERFUSION IMAGING
LV dias vol: 61 mL (ref 46–106)
LV sys vol: 7 mL (ref 3.8–5.2)
Nuc Stress EF: 89 %
Peak HR: 110 {beats}/min
Rest HR: 82 {beats}/min
Rest Nuclear Isotope Dose: 10.8 mCi
SDS: 0
SRS: 0
SSS: 0
ST Depression (mm): 0 mm
Stress Nuclear Isotope Dose: 31.2 mCi
TID: 1.03

## 2024-06-27 MED ORDER — TECHNETIUM TC 99M TETROFOSMIN IV KIT
10.8000 | PACK | Freq: Once | INTRAVENOUS | Status: AC | PRN
  Administered 2024-06-27: 10.8 via INTRAVENOUS

## 2024-06-27 MED ORDER — REGADENOSON 0.4 MG/5ML IV SOLN
0.4000 mg | Freq: Once | INTRAVENOUS | Status: AC
  Administered 2024-06-27: 0.4 mg via INTRAVENOUS

## 2024-06-27 MED ORDER — TECHNETIUM TC 99M TETROFOSMIN IV KIT
30.9000 | PACK | Freq: Once | INTRAVENOUS | Status: AC | PRN
  Administered 2024-06-27: 30.9 via INTRAVENOUS

## 2024-06-27 MED ORDER — REGADENOSON 0.4 MG/5ML IV SOLN
INTRAVENOUS | Status: AC
  Filled 2024-06-27: qty 5

## 2024-06-29 ENCOUNTER — Ambulatory Visit: Admitting: Podiatry

## 2024-06-29 ENCOUNTER — Encounter: Payer: Self-pay | Admitting: Podiatry

## 2024-06-29 DIAGNOSIS — M79674 Pain in right toe(s): Secondary | ICD-10-CM

## 2024-06-29 DIAGNOSIS — M79675 Pain in left toe(s): Secondary | ICD-10-CM | POA: Diagnosis not present

## 2024-06-29 DIAGNOSIS — Q828 Other specified congenital malformations of skin: Secondary | ICD-10-CM | POA: Diagnosis not present

## 2024-06-29 DIAGNOSIS — B351 Tinea unguium: Secondary | ICD-10-CM

## 2024-06-29 DIAGNOSIS — I739 Peripheral vascular disease, unspecified: Secondary | ICD-10-CM | POA: Diagnosis not present

## 2024-06-30 ENCOUNTER — Telehealth: Payer: Self-pay | Admitting: Pediatrics

## 2024-06-30 ENCOUNTER — Ambulatory Visit (HOSPITAL_COMMUNITY)
Admission: RE | Admit: 2024-06-30 | Discharge: 2024-06-30 | Disposition: A | Discharge status: Home / Self Care | Source: Ambulatory Visit | Attending: Pediatrics | Admitting: Pediatrics

## 2024-06-30 DIAGNOSIS — R140 Abdominal distension (gaseous): Secondary | ICD-10-CM | POA: Diagnosis present

## 2024-06-30 DIAGNOSIS — R1013 Epigastric pain: Secondary | ICD-10-CM | POA: Diagnosis present

## 2024-06-30 DIAGNOSIS — R142 Eructation: Secondary | ICD-10-CM | POA: Diagnosis present

## 2024-06-30 DIAGNOSIS — K219 Gastro-esophageal reflux disease without esophagitis: Secondary | ICD-10-CM | POA: Insufficient documentation

## 2024-06-30 NOTE — Telephone Encounter (Signed)
 Patient states she received a call from a provider in regards to scheduling for and egd?   Requesting f/u call to speak about next steps. Please advise.

## 2024-06-30 NOTE — Telephone Encounter (Signed)
 Returned patient call & she said she had UGI series done today, and received a call from our office. She was unsure who it was from. Says it was in regards to her results from today.

## 2024-07-01 ENCOUNTER — Other Ambulatory Visit: Payer: Self-pay

## 2024-07-01 DIAGNOSIS — T17908A Unspecified foreign body in respiratory tract, part unspecified causing other injury, initial encounter: Secondary | ICD-10-CM

## 2024-07-01 DIAGNOSIS — K224 Dyskinesia of esophagus: Secondary | ICD-10-CM

## 2024-07-01 NOTE — Telephone Encounter (Signed)
 Spoke with patient regarding MD recommendations. Speech referral placed. Her main concerns is with difficulty swallowing, feels that her reflux and abdominal cramping has improved since increasing nexium  to 20 mg BID & starting a probiotic.

## 2024-07-04 ENCOUNTER — Telehealth: Payer: Self-pay | Admitting: Pediatrics

## 2024-07-04 NOTE — Progress Notes (Signed)
  Subjective:  Patient ID: Cindy Schwartz, female    DOB: 08/03/1934,  MRN: [id number]  Cindy Schwartz presents to clinic today for at risk foot care. Patient has h/o Raynaud's disease and painful porokeratotic lesion(s) of both feet and painful mycotic toenails that limit ambulation. Painful toenails interfere with ambulation. Aggravating factors include wearing enclosed shoe gear. Pain is relieved with periodic professional debridement. Painful porokeratotic lesions are aggravated when weightbearing with and without shoegear. Pain is relieved with periodic professional debridement.  Chief Complaint  Patient presents with   RFC     RFC Non diabetic toenail trim. LOV with PCP 05/10/24.   New problem(s): None.   PCP is [name], MD.  Allergies  Allergen Reactions   Chlorhexidine  Dermatitis   Prednisone  Other (See Comments)    Fatigue and depressed mood on 7 day 40mg  x 3 days, 20 mg x 4 days from Dr. Katrinka- intolerance  prednisone   Fatigue and depressed mood on 7 day 40mg  x 3 days, 20 mg x 4 days from Dr. Katrinka- intolerance  prednisone     prednisone    Chlorhexidine  Gluconate Rash    Review of Systems: Negative except as noted in the HPI.  Objective: No changes noted in today's physical examination. There were no vitals filed for this visit. Cindy Schwartz is a pleasant 89 y.o. female WD, WN in NAD. AAO x 3.  Vascular Examination: Palpable pedal pulses. CFT immediate b/l. Pedal hair present. No edema. No pain with calf compression b/l. Skin temperature gradient WNL b/l. No varicosities noted. No cyanosis or clubbing noted.  Neurological Examination: Sensation grossly intact b/l with 10 gram monofilament. Vibratory sensation intact b/l.  Dermatological Examination: Pedal skin with normal turgor, texture and tone b/l. No open wounds nor interdigital macerations noted.   Toenails 1-5 b/l well maintained with adequate length. No erythema, no edema, no drainage, no  fluctuance.  Porokeratotic lesion(s) submet head 2 left foot and submet head 2 right foot. No erythema, no edema, no drainage, no fluctuance.  Musculoskeletal Examination: Muscle strength 5/5 to b/l LE.  No pain, crepitus noted b/l. No gross pedal deformities. Patient ambulates independently without assistive aids.   Radiographs: None  Assessment/Plan: 1. Pain due to onychomycosis of toenails of both feet   2. Porokeratosis   3. PVD (peripheral vascular disease)   Patient was evaluated and treated. All patient's and/or POA's questions/concerns addressed on today's visit. Toenails 1-5 debrided in length and girth without incident. Porokeratotic lesion(s) submet head 2 b/l pared with sharp debridement without incident. Continue soft, supportive shoe gear daily. Report any pedal injuries to medical professional. Call office if there are any questions/concerns.  Return in about 3 months (around 09/29/2024).  Cindy Schwartz, DPM      [place] LOCATION: [address], [place] [address]                   Office [phone]   [facility] LOCATION: [address] [place], [place] [address] Office [phone]

## 2024-07-04 NOTE — Telephone Encounter (Signed)
 I spoke on the telephone with Cindy Schwartz to review a recent finding of possible aspiration at the time of attempted upper GI series.  She states that the technician performing the test halted the exam early because of concern for aspiration.  She did not note any symptoms of choking or coughing.  Feels that her reflux symptoms with respect to burning and regurgitation are generally well-controlled on Nexium  20 mg daily.  Her primary concern is related to frequent belching and then a sensation of food digesting in her epigastric region.  No nausea or vomiting.  No epigastric pain.  She is not endorsing frank esophageal dysphagia.  Reviewed the results of her previous barium esophagram from 2022 which showed severe esophageal dysmotility and patulous esophagus but no stricture.  Also reviewed the results of her CT angiogram 06/18/2021 which did not show abnormalities of the esophagus or stomach from a structural standpoint -she was worried about malignancy and reassured her no findings were identified in that respect.  Recommended proceeding with SLP evaluation which has been scheduled for November.  Will also perform Diatherix H. pylori stool antigen to evaluate for H. pylori.  Pending that results further decisions will be made in her care.  Discussed that an EGD may be a consideration in the future but given her age and risks of anesthesia will attempt to evaluate symptoms noninvasively.

## 2024-07-05 ENCOUNTER — Other Ambulatory Visit: Payer: Self-pay

## 2024-07-05 DIAGNOSIS — R140 Abdominal distension (gaseous): Secondary | ICD-10-CM

## 2024-07-05 DIAGNOSIS — R142 Eructation: Secondary | ICD-10-CM

## 2024-07-05 DIAGNOSIS — R109 Unspecified abdominal pain: Secondary | ICD-10-CM

## 2024-07-05 NOTE — Telephone Encounter (Signed)
 Patient returned call, please advise.

## 2024-07-05 NOTE — Telephone Encounter (Signed)
 Pt made aware of Dr. Suzann recommendations: Stool kit prepared and placed on 2nd floor for pickup.  Pt made aware.  Pt was scheduled for 09/05/2024 at 8:30 AM with Dr. Suzann. Pt made aware. Pt verbalized understanding with all questions answered.

## 2024-07-05 NOTE — Telephone Encounter (Signed)
 Left message for pt to call back

## 2024-07-12 ENCOUNTER — Other Ambulatory Visit: Payer: Self-pay

## 2024-07-12 NOTE — Telephone Encounter (Signed)
Please see patient message and advise.

## 2024-07-12 NOTE — Telephone Encounter (Signed)
 What is she looking for treatment of potentially?-I do not see any recent UTIs

## 2024-07-12 NOTE — Telephone Encounter (Signed)
 Copied from CRM #[id number]. Topic: Clinical - Medication Question >> Jul 12, 2024 10:56 AM Rea BROCKS wrote: Reason for CRM: Patient will be leaving out the country to [place] and she is requesting if Dr. Katrinka can call in this antibiotic for her Cefdinir  or Omnicef  that she can have for just in case.   [id number] [place], [place] - [address] AT [facility] [address] [facility] [address] [place] [address] Phone: [phone] Fax: [phone] Hours: Not open 24 hours    [phone] (M)

## 2024-07-13 NOTE — Telephone Encounter (Signed)
 Spoke with patient and [place] will [place] going to [place] and she doesn't want to travel without abx incase she gets sick overseas.

## 2024-07-13 NOTE — Telephone Encounter (Signed)
 Left voicemail for patient regarding need for ABX

## 2024-07-14 ENCOUNTER — Other Ambulatory Visit (INDEPENDENT_AMBULATORY_CARE_PROVIDER_SITE_OTHER): Payer: Self-pay

## 2024-07-14 ENCOUNTER — Ambulatory Visit: Admitting: Orthopedic Surgery

## 2024-07-14 DIAGNOSIS — M5416 Radiculopathy, lumbar region: Secondary | ICD-10-CM

## 2024-07-14 NOTE — Progress Notes (Signed)
 Orthopedic Spine Surgery Office Note   Assessment: Patient is a 89 y.o. female with low back pain that radiates into her bilateral lower extremities. MRI shows lateral recess stenosis at L2/3, central and lateral recess stenosis from L3-S1.      Plan: -Patient has tried advil, PT, exercise program, gabapentin , muscle relaxer -Talked about Lyrica, injection, and surgical decompression as potential next treatment options.  After this discussion, patient wanted to try an injection.  Referral provided to her today -Patient should return to office on an as-needed basis     Patient expressed understanding of the plan and all questions were answered to the patient's satisfaction.    ___________________________________________________________________________     History:   Patient is a 89 y.o. female who presents today for follow up on her lumbar spine.  Patient has had continued low back pain that radiates into her bilateral lower extremities.  She said the leg pain is worse than the back pain.  She feels the pain going down the lateral aspect of the thighs and legs to the level of the ankle.  She still finds that the pain is worse if she is seated for too long.  However, she also notes that when she is standing or walking.  She has been trying multiple medications and at home exercise program since she was last in the office but has not noticed any significant improvement.   Treatments tried: advil, PT, exercise program, gabapentin , muscle relaxer     Physical Exam:   General: no acute distress, appears stated age Neurologic: alert, answering questions appropriately, following commands Respiratory: unlabored breathing on room air, symmetric chest rise Psychiatric: appropriate affect, normal cadence to speech     MSK (spine):   -Strength exam                                                   Left                  Right EHL                              5/5                  5/5 TA                                  5/5                  5/5 GSC                             5/5                  5/5 Knee extension            5/5                  5/5 Hip flexion                    5/5                  5/5   -Sensory exam  Sensation intact to light touch in L3-S1 nerve distributions of bilateral lower extremities    Imaging: XRs of the lumbar spine from 07/14/2024 were independently reviewed and interpreted, showing disc height loss at L2/3.  Chronic appearing compression fracture at L1.  No new fracture seen.  No evidence of instability on flexion/extension views.   MRI of the lumbar spine from 06/16/2022 was previously independently reviewed and interpreted, showing old compression fracture at L1. Lateral recess stenosis at L2/3. Central and lateral recess stenosis at L3/4, L4/5, and L5/S1.      Patient name: Cindy Schwartz Patient MRN: [id number] Date of visit: 07/14/24

## 2024-07-14 NOTE — Progress Notes (Signed)
Xr l

## 2024-07-14 NOTE — Telephone Encounter (Signed)
 Team informs me patient is concerned about sinus infections-Augmentin  should be adequate for this but she should only take if symptoms over 10 days or double sickening  I pended the order- if e2c2 can send this in that would be great (or team you can send same thing under orders only encounter with same instructions) - was not opened under our office so I cannot prescribe anything- this is a recurrent issue we have given feedback on multiple times to [facility]

## 2024-07-14 NOTE — Addendum Note (Signed)
 Addended by: [name] on: 07/14/2024 08:37 AM   Modules accepted: Orders

## 2024-07-20 ENCOUNTER — Telehealth: Payer: Self-pay | Admitting: Family Medicine

## 2024-07-20 NOTE — Telephone Encounter (Signed)
 Please see patient message and advise.    Copied from CRM #[id number]. Topic: Clinical - Medical Advice >> Jul 20, 2024  4:12 PM Cindy Schwartz wrote: Reason for CRM: Patient is scheduled for a steroid injection with Dr Eldonna and is concerned about a possible reaction, would like to discuss with Dr Katrinka- [phone]

## 2024-07-21 MED ORDER — AMOXICILLIN-POT CLAVULANATE 875-125 MG PO TABS: 1.0000 | ORAL_TABLET | Freq: Two times a day (BID) | ORAL | 0 refills | Status: AC

## 2024-07-21 NOTE — Telephone Encounter (Signed)
 She did not do well with prednisone  but this is a different formulation and focused on a joint- I'm hoping she does well with it and we want to see if she feels better with it

## 2024-07-21 NOTE — Telephone Encounter (Signed)
 Patient aware of provider remarks and recommendations. No further questions at this time and verbalized understanding.

## 2024-07-25 ENCOUNTER — Telehealth: Payer: Self-pay | Admitting: Physical Medicine and Rehabilitation

## 2024-07-25 NOTE — Telephone Encounter (Signed)
 Patient called and needs to cancel her appointment because she is going out of the country. CB# [phone]

## 2024-07-27 ENCOUNTER — Encounter: Admitting: Physical Medicine and Rehabilitation

## 2024-07-27 ENCOUNTER — Telehealth: Payer: Self-pay | Admitting: Pediatrics

## 2024-07-27 MED ORDER — HYDROCORTISONE ACETATE 25 MG RE SUPP: 25.0000 mg | Freq: Two times a day (BID) | RECTAL | 1 refills | Status: DC

## 2024-07-27 NOTE — Telephone Encounter (Signed)
 Called & informed patient that we have sent in a prescription for Anusol suppositories. I explained to patient that she will insert suppository rectally BID. Patient will only be gone for 13 days. Patient states that she has not forgotten about the stool test. Patient will stop by the office to pick up stool kit when she gets back in town. Patient had no other concerns at the end of the call.   Anusol suppository prescription sent to [facility] pharmacy on file.

## 2024-07-27 NOTE — Telephone Encounter (Signed)
 Called & spoke with patient. Patient reports that she has been experiencing rectal discomfort & burning since last week. On Sunday patient started Preparation H cream BID and has continued with no relief. Patient states that she has been having some incontinence. Patient has continued Miralax due to history of constipation and she denies any straining or sitting on commode for long periods of time. Patient denies any rectal bleeding. Patient states that it feels like pressure at her rectum, like she needs to have a BM. Patient will be traveling out of the country on Monday and seeking treatment options. Please advise, thanks.

## 2024-07-27 NOTE — Telephone Encounter (Signed)
 Inbound call from patient stating that she is having Discomfort is in her rectum th tat is like a burning feeling. She states she is going out of the country on Monday morning and is requesting a call to discuss to see if she can get medicine for it. Please advise.

## 2024-07-28 ENCOUNTER — Telehealth: Payer: Self-pay | Admitting: Family Medicine

## 2024-07-28 ENCOUNTER — Other Ambulatory Visit: Payer: Self-pay

## 2024-07-28 MED ORDER — AMOXICILLIN-POT CLAVULANATE 875-125 MG PO TABS: 1.0000 | ORAL_TABLET | Freq: Two times a day (BID) | ORAL | 0 refills | Status: DC

## 2024-07-28 NOTE — Telephone Encounter (Signed)
 Patient called stating that the suppositories that were sent to her pharmacy will cost her 850 dollars and is requesting a call back to discuss if there is anything else she can use. Please advise.

## 2024-07-28 NOTE — Telephone Encounter (Signed)
 Noted. This has already been taken care of.

## 2024-07-28 NOTE — Telephone Encounter (Signed)
 Pt states pharmacy is telling her they have not received her RX for : amoxicillin -clavulanate (AUGMENTIN ) 875-125 MG tablet

## 2024-07-28 NOTE — Telephone Encounter (Signed)
 Called [facility]. Generic Hydrocortisone suppositories are still not covered and will be the same price.   Called patient. Left a detailed vm letting her know that the generic suppositories are not covered. Advised patient to purchase Calmol rectal suppositories as use over the counter BID as needed. Advised patient to call back with any questions.

## 2024-07-28 NOTE — Addendum Note (Signed)
 Addended by: Folasade Mooty N on: 07/28/2024 12:15 PM   Modules accepted: Orders

## 2024-07-28 NOTE — Telephone Encounter (Signed)
 Script sent to pharmacy requested.    Copied from CRM [id number]. Topic: Clinical - Medication Question >> Jul 28, 2024  1:01 PM Harlene ORN wrote: Reason for CRM: Patient called to follow up on antibiotic that was sent to the pharmacy on 07/21/2024. Informed the practice that the patient is having trouble picking up the medicine, asked the patient to wait 30 minutes before calling the pharmacy back to check if the pharmacy is ready for prescription to be picked up.

## 2024-08-05 ENCOUNTER — Other Ambulatory Visit: Payer: Self-pay | Admitting: Family Medicine

## 2024-08-16 ENCOUNTER — Ambulatory Visit

## 2024-08-17 ENCOUNTER — Ambulatory Visit (INDEPENDENT_AMBULATORY_CARE_PROVIDER_SITE_OTHER): Admitting: Family Medicine

## 2024-08-17 ENCOUNTER — Encounter: Payer: Self-pay | Admitting: Family Medicine

## 2024-08-17 VITALS — BP 168/88 | HR 71 | Temp 98.2°F | Ht 65.0 in | Wt 135.0 lb

## 2024-08-17 DIAGNOSIS — R030 Elevated blood-pressure reading, without diagnosis of hypertension: Secondary | ICD-10-CM | POA: Diagnosis not present

## 2024-08-17 DIAGNOSIS — R051 Acute cough: Secondary | ICD-10-CM

## 2024-08-17 DIAGNOSIS — U071 COVID-19: Secondary | ICD-10-CM | POA: Diagnosis not present

## 2024-08-17 LAB — POC COVID19 BINAXNOW: SARS Coronavirus 2 Ag: POSITIVE — AB

## 2024-08-17 NOTE — Progress Notes (Signed)
 Subjective  CC:  Chief Complaint  Patient presents with   Cough    Pt stated that she traveled outside the country and came back with a cough that has been going on for the past 4 days but it has gotten better    Same day acute visit; PCP not available. New pt to me. Chart reviewed.   HPI: Cindy Schwartz is a 89 y.o. female who presents to the office today to address the problems listed above in the chief complaint. Discussed the use of AI scribe software for clinical note transcription with the patient, who gave verbal consent to proceed.  History of Present Illness Cindy Schwartz is an 89 year old female who presents with COVID-19 symptoms.  Covid-19 infection and constitutional symptoms - Tested positive for COVID-19 - Symptom onset five days ago after returning from a trip on Saturday - Symptoms fluctuate, with periods of feeling well alternating with times requiring rest - Fatigue present, but able to walk and manage daily activities - Generalized body aches - No prior history of lung disease - History of two prior COVID-19 infections  Respiratory symptoms - Initial cough, now loosened and primarily upper respiratory in nature - Slight heaviness with breathing - No chest tightness - No wheezing - No shortness of breath  Supplement use - Currently taking vitamin C and zinc supplements  Elevated BP in office today w/o cp or palpations or headache  Recent travel - Recently returned from a physically demanding pilgrimage to [place] and other parts of [place]   Assessment  1. COVID-19   2. Acute cough   3. Elevated blood pressure reading without diagnosis of hypertension      Plan  Assessment and Plan Assessment & Plan COVID-19 infection with acute cough COVID-19 infection confirmed by positive test. Symptoms include upper respiratory symptoms with cough, fatigue, and mild wheezing. No alarming symptoms such as severe respiratory distress or hypoxia. Oxygen levels are  good, and lung examination reveals mild wheezing without significant findings. Symptoms are typical for COVID-19 and are expected to improve with supportive care. No need for antiviral treatment or chest x-ray at this stage. - Advised rest and hydration. - Recommended symptomatic treatment with cough medicine, decongestant, nasal saline, Flonase , or allergy  pill as needed. - Suggested Advil for body aches. - Encouraged intake of vitamin C and zinc. - Instructed to monitor symptoms and seek further evaluation if symptoms worsen.   Monitor bp once over acute illness.  Follow up: prn Orders Placed This Encounter  Procedures   POC COVID-19   No orders of the defined types were placed in this encounter.    I reviewed the patients updated PMH, FH, and SocHx.  Patient Active Problem List   Diagnosis Date Noted   Lumbar stenosis 01/08/2023   Bloating 07/31/2022   Left sided abdominal pain 07/31/2022   Belching 07/31/2022   COPD (chronic obstructive pulmonary disease) (HCC) 03/17/2022   Osteoarthritis of carpometacarpal (CMC) joints of both thumbs 09/26/2021   Pain due to onychomycosis of toenails of both feet 12/25/2020   History of cerebellar stroke 10/05/2020   Memory loss 10/05/2020   Osteoarthritis of both hands 08/29/2020   Bilateral carpal tunnel syndrome 08/29/2020   Trigger thumb of left hand 08/29/2020   History of adenomatous polyp of colon 12/30/2019   Mild hyperlipidemia 06/01/2019   Degenerative arthritis of knee, bilateral 04/08/2019   Chronic idiopathic constipation 03/01/2019   Chronic cough 01/21/2018   Genetic testing 07/09/2017   history of Breast  cancer of lower-outer quadrant of right female breast  01/16/2016   Palpitations 11/13/2014   Normal coronary arteries 10/17/2014   Varicose veins of bilateral lower extremities with other complications 03/09/2012   Hx of herpes simplex infection 12/18/2011   Low back pain 06/28/2010   Lower abdominal pain 06/28/2010    Generalized anxiety disorder 10/13/2008   Irritable bowel syndrome 10/13/2008   Lung nodule 11/25/2007   Raynaud's syndrome 07/01/2007   Aneurysm of thoracic aorta 05/06/2007   Allergic rhinitis 05/06/2007   Esophageal reflux 05/06/2007   DEGENERATIVE DISC DISEASE, CERVICAL SPINE 05/06/2007   Osteoporosis 03/18/2007   History of ovarian cancer 09/22/1977   Current Meds  Medication Sig   Ascorbic Acid (VITAMIN C) 1000 MG tablet Take 1,000 mg by mouth daily.   aspirin  EC (ASPIRIN  LOW DOSE) 81 MG tablet TAKE 1 TABLET DAILY (SWALLOW WHOLE)   Calcium  Carbonate (CALCIUM  500 PO) Take by mouth.   Cholecalciferol 25 MCG (1000 UT) capsule Take by mouth. (Patient taking differently: Take by mouth. Takes in the winter months)   COLLAGEN PO Take by mouth.   esomeprazole  (NEXIUM ) 20 MG capsule Take 1 capsule (20 mg total) by mouth daily at 12 noon.   gabapentin  (NEURONTIN ) 100 MG capsule Take 100 mg by mouth 2 (two) times daily.   ketoconazole (NIZORAL) 2 % cream APPLY TOPICALLY TO EYEBROWS TWICE DAILY   MAGNESIUM  PO Take 300 mg by mouth.   meloxicam  (MOBIC ) 7.5 MG tablet Take 1 tablet (7.5 mg total) by mouth daily.   nitroGLYCERIN  (NITROSTAT ) 0.4 MG SL tablet Place 1 tablet (0.4 mg total) under the tongue every 5 (five) minutes as needed for chest pain.   Probiotic Product (PROBIOTIC PO) Take 1 capsule by mouth daily.   rosuvastatin  (CRESTOR ) 5 MG tablet TAKE 1 TABLET DAILY   valACYclovir  (VALTREX ) 500 MG tablet Take 1 tablet (500 mg total) by mouth 2 (two) times a week.   [DISCONTINUED] amoxicillin -clavulanate (AUGMENTIN ) 875-125 MG tablet Take 1 tablet by mouth 2 (two) times daily. 1 tabet by mough 2 times daily for 7 days. Only take if sinusitis symptoms over 10 days or double sickening.   Allergies: Patient is allergic to chlorhexidine , prednisone , and chlorhexidine  gluconate. Family History: Patient family history includes Colon polyps in her brother; Esophageal cancer (age of onset: 88)  in her cousin; Healthy in her brother; Hip fracture in her mother; Other in her father. Social History:  Patient  reports that she has never smoked. She has never used smokeless tobacco. She reports that she does not drink alcohol and does not use drugs.  Review of Systems: Constitutional: Negative for fever malaise or anorexia Cardiovascular: negative for chest pain Respiratory: negative for SOB or persistent cough Gastrointestinal: negative for abdominal pain  Objective  Vitals: BP (!) 168/88   Pulse 71   Temp 98.2 F (36.8 C)   Ht 5' 5 (1.651 m)   Wt 135 lb (61.2 kg)   LMP  (LMP Unknown)   SpO2 98%   BMI 22.47 kg/m  General: no acute distress , A&Ox3, appears well w/o respiratory distress, normal speech HEENT: PEERL, conjunctiva normal, neck is supple Cardiovascular:  RRR without murmur or gallop.  Respiratory:  Good breath sounds bilaterally, CTAB with normal respiratory effort except occ wheeze right upper chest. Skin:  Warm, no rashes  Office Visit on 08/17/2024  Component Date Value Ref Range Status   SARS Coronavirus 2 Ag 08/17/2024 Positive (A)  Negative Final    Commons side effects,  risks, benefits, and alternatives for medications and treatment plan prescribed today were discussed, and the patient expressed understanding of the given instructions. Patient is instructed to call or message via MyChart if he/she has any questions or concerns regarding our treatment plan. No barriers to understanding were identified. We discussed Red Flag symptoms and signs in detail. Patient expressed understanding regarding what to do in case of urgent or emergency type symptoms.  Medication list was reconciled, printed and provided to the patient in AVS. Patient instructions and summary information was reviewed with the patient as documented in the AVS. This note was prepared with assistance of Dragon voice recognition software. Occasional wrong-word or sound-a-like substitutions may  have occurred due to the inherent limitations of voice recognition software

## 2024-09-04 NOTE — Progress Notes (Unsigned)
 [facility] Return Visit   Referring Provider [name], MD [address] [place],  [place] [address]  Primary Care Provider [name], MD  Cindy Schwartz Profile: Cindy Schwartz is a 89 y.o. female who returns to the [facility] for follow-up of the problem(s) noted below.  Problem List: Chronic constipation Colonic diverticulosis History of colon polyps-tubular adenoma and sessile serrated adenoma GERD Esophageal dysmotility Bloating-borderline test for SIBO   History of Present Illness   Cindy Schwartz was last seen in the GI office 03/09/2024 by Cathryne May, NP   Current GI Meds  Nexium  20 mg p.o. daily Probiotic   Interval History   Discussed the use of AI scribe software for clinical note transcription with the Cindy Schwartz, who gave verbal consent to proceed.  History of Present Illness Cindy Schwartz is an 89 year old female with a past medical history noteworthy for aortic aneurysm, Raynaud's syndrome, COPD, carpal tunnel syndrome, breast cancer, ovarian cancer who returns to the gastroenterology office for follow-up of epigastric abdominal discomfort, bloating, eructation constipation and GERD  Epigastric abdominal discomfort, eructation, GERD - Previously reported  persistent belching and gastrointestinal discomfort occurring almost daily, particularly when lying down, which delays sleep onset - Sensation of fullness and occasional nausea accompanying the discomfort - Abdominal massage provides relief and leads to burping, which alleviates symptoms  - Barium swallow in 2022 and CT angiogram of the chest in 2025 showed no abnormalities - Attempted upper GI series 06/2024 which was halted for possible aspiration -Cindy Schwartz denies swallowing difficulties - Previously discussed possibility of SLP evaluation given concern for aspiration, however, given absence of symptoms we will defer this  - Marked improvement in reflux symptoms during  a recent 12-day trip to [place], without use of Nexium  20 mg and no heartburn experienced. - Since returning home, restarted Nexium   20 mg but can skip doses at times without recurrence of symptoms. - Monitors diet and avoids foods that trigger reflux  - Previously discussed evaluating Cindy Schwartz for H. pylori but she did not submit stool specimen yet - Records suggest a remote history of positive H. pylori serology in the past -she is agreeable to H. pylori testing   Constipation management and bloating - Started a new probiotic the name of which she cannot recall that has been helpful in regulating her bowel movements - Has incorporated more vegetables into her diet and remains physically active which has been beneficial - Currently having 2-3 formed bowel movements a day without any blood or mucus - No longer utilizing Chia seeds or MiraLAX; has MiraLAX available for breakthrough symptoms  - CTAP 2024 showed colonic diverticulosis without diverticulitis and moderate stool burden - CT angiogram-colonic diverticulosis without diverticulitis; no other abnormalities in the chest or abdomen; stable 4.2 cm ascending thoracic aneurysm - SIBO breath test 10/26/2023 did not suggest bacterial overgrowth; there was isolated increase in methane  GI Review of Symptoms Significant for none otherwise negative.  General Review of Systems  Review of systems is significant for the pertinent positives and negatives as listed per the HPI.  Full ROS is otherwise negative.  Past Medical History   Past Medical History:  Diagnosis Date   Aneurysm of ascending aorta    a. 4.2-4.3cm in 2014.   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   Insomnia    Irregular heart beat    Normal coronary arteries Cath - 09/2014   Osteoporosis    Raynaud's disease      Past Surgical History   Past Surgical History:  Procedure Laterality Date   ABDOMINAL HYSTERECTOMY  1979   APPENDECTOMY     BILATERAL SALPINGOOPHORECTOMY     BREAST LUMPECTOMY     COLONOSCOPY  02/2012   LEFT HEART CATHETERIZATION WITH CORONARY ANGIOGRAM N/A 10/17/2014   Procedure: LEFT HEART CATHETERIZATION WITH CORONARY ANGIOGRAM;  Surgeon: Debby DELENA Sor, MD;  Location: [facility] CATH LAB;  Service: Cardiovascular;  Laterality: N/A;   MASTECTOMY Right 2018   no chemo no radiation no tamoxifen   ovarian ca s/p taj/bso  1979   SIMPLE MASTECTOMY WITH AXILLARY SENTINEL NODE BIOPSY Right 05/05/2017   Procedure: RIGHT TOTAL MASTECTOMY WITH RIGHT  AXILLARY SENTINEL NODE BIOPSY;  Surgeon: Ebbie Cough, MD;  Location: [facility] OR;  Service: General;  Laterality: Right;  PECTORAL BLOCK     Allergies and Medications   Allergies  Allergen Reactions   Chlorhexidine  Dermatitis   Prednisone  Other (See Comments)    Fatigue and depressed mood on 7 day 40mg  x 3 days, 20 mg x 4 days from Dr. Katrinka- intolerance  prednisone   Fatigue and depressed mood on 7 day 40mg  x 3 days, 20 mg x 4 days from Dr. Katrinka- intolerance  prednisone     prednisone    Chlorhexidine  Gluconate Rash   Current Meds  Medication Sig   Ascorbic Acid (VITAMIN C) 1000 MG tablet Take 1,000 mg by mouth daily.   aspirin  EC (ASPIRIN  LOW DOSE) 81 MG tablet TAKE 1 TABLET DAILY (SWALLOW WHOLE)   Calcium  Carbonate (CALCIUM  500 PO) Take by mouth.   Cholecalciferol 25 MCG (1000 UT) capsule  Take by mouth. (Cindy Schwartz taking differently: Take by mouth. Takes in the winter months)   COLLAGEN PO Take by mouth.   gabapentin  (NEURONTIN ) 100 MG capsule Take 100 mg by mouth 2 (two) times daily.   ketoconazole (NIZORAL) 2 % cream APPLY TOPICALLY TO EYEBROWS TWICE DAILY   MAGNESIUM  PO Take 300 mg by mouth.   nitroGLYCERIN  (NITROSTAT ) 0.4 MG SL tablet Place 1 tablet (0.4 mg total) under the tongue every 5 (five) minutes as needed for chest pain.   Probiotic Product (PROBIOTIC PO) Take 1 capsule by mouth daily.   rosuvastatin  (CRESTOR ) 5 MG tablet TAKE 1 TABLET DAILY   valACYclovir  (VALTREX ) 500 MG tablet Take 1 tablet (500 mg total) by mouth 2 (two) times a week.   [DISCONTINUED] esomeprazole  (NEXIUM ) 20 MG capsule Take 1 capsule (20 mg total) by mouth daily at 12 noon.    Family His   Family History  Problem Relation Age of Onset   Hip fracture Mother    Other Father        lived to 57   Esophageal cancer Cousin 80       mat first cousin related through uncle   Healthy  Brother    Colon polyps Brother    Colon cancer Neg Hx    Pancreatic cancer Neg Hx    Stomach cancer Neg Hx     Social History   Social History   Tobacco Use   Smoking status: Never   Smokeless tobacco: Never  Vaping Use   Vaping status: Never Used  Substance Use Topics   Alcohol use: No   Drug use: No   Allure reports that she has never smoked. She has never used smokeless tobacco. She reports that she does not drink alcohol and does not use drugs.  Vital Signs and Physical Examination   Vitals:   09/05/24 0841  BP: 118/64  Pulse: (!) 54    Body mass index is 22.71 kg/m. Weight: 136 lb 8 oz (61.9 kg)  General: Well developed, well nourished, no acute distress Head: Normocephalic and atraumatic Eyes: Sclerae anicteric, EOMI Lungs: Clear throughout to auscultation Heart: Regular rate and rhythm; No murmurs, rubs or bruits Abdomen: Soft, non tender and non distended. No masses,  hepatosplenomegaly or hernias noted. Normal Bowel sounds Rectal: Deferred Musculoskeletal: Symmetrical with no gross deformities    Review of Data   The following data was reviewed at the time of this encounter:   Laboratory Studies      Latest Ref Rng & Units 06/18/2024   11:31 AM 04/04/2024    8:58 AM 02/24/2024    9:51 AM  CBC  WBC 4.0 - 10.5 K/uL 6.2  4.7  5.2   Hemoglobin 12.0 - 15.0 g/dL 86.8  86.9  86.9   Hematocrit 36.0 - 46.0 % 39.6  39.6  39.1   Platelets 150 - 400 K/uL 244  224.0  214.0     Lab Results  Component Value Date   LIPASE 38.0 03/09/2023      Latest Ref Rng & Units 06/18/2024   11:31 AM 04/04/2024    8:58 AM 02/01/2024    4:21 PM  CMP  Glucose 70 - 99 mg/dL 95  86  96   BUN 8 - 23 mg/dL 13  13  18    Creatinine 0.44 - 1.00 mg/dL 9.38  9.32  9.32   Sodium 135 - 145 mmol/L 131  133  133   Potassium 3.5 - 5.1 mmol/L 3.8  4.4  3.8   Chloride 98 - 111 mmol/L 95  97  99   CO2 22 - 32 mmol/L 26  30  28    Calcium  8.9 - 10.3 mg/dL 89.9  89.9  9.6   Total Protein 6.0 - 8.3 g/dL  7.2  7.0   Total Bilirubin 0.2 - 1.2 mg/dL  0.6  0.4   Alkaline Phos 39 - 117 U/L  69  57   AST 0 - 37 U/L  21  22   ALT 0 - 35 U/L  18  14      Imaging Studies  Upper GI series 06/30/2024 FINDINGS: Study was begun with drinking effervescent crystals and high-density barium. Small amount of aspiration was noted after initiating this study, and study was stopped promptly after visualizing the aspiration, with no further barium intake.   Esophagus: Limited evaluation due to aspiration. Grossly normal appearance from limited evaluation.   Esophageal motility: Limited evaluation due to aspiration. Grossly normal appearance from limited evaluation   Gastroesophageal reflux: Not assessed   Stomach: Not directly assessed.  No obvious hiatal hernia   Gastric emptying: Not assessed.   Duodenum: Not assessed   Other:  None.   IMPRESSION: Limited study due to aspiration of  barium. Grossly normal esophageal appearance. Recommend speech pathology consultation.  CT angiogram 06/18/2024 Vascular:   1. Stable 4.2 cm ascending thoracic aortic aneurysm. No evidence of dissection. Recommend annual imaging followup by CTA or MRA. This recommendation follows 2010 ACCF/AHA/AATS/ACR/ASA/SCA/SCAI/SIR/STS/SVM Guidelines for the Diagnosis and Management of Patients with Thoracic Aortic Disease. Circulation. 2010; 121: Z733-z630. Aortic aneurysm NOS (ICD10-I71.9) 2. Atherosclerosis of the thoracoabdominal aorta. 3. No evidence of pulmonary embolus.   Nonvascular:   1. Distal colonic diverticulosis without diverticulitis. 2. Otherwise no acute intrathoracic, intra-abdominal, or intrapelvic process.  KUB 03/09/2024 1. Moderate fecal retention throughout the colon consistent with constipation. No obstruction or ileus.  CT chest 03/02/2024 1. No evidence of interstitial lung disease. 2. 4.2 cm ascending aortic aneurysm, stable. Recommend annual imaging followup by CTA or MRA. This recommendation follows 2010 ACCF/AHA/AATS/ACR/ASA/SCA/SCAI/SIR/STS/SVM Guidelines for the Diagnosis and Management of Patients with Thoracic Aortic Disease. Circulation. 2010; 121: Z733-z630. Aortic aneurysm NOS (ICD10-I71.9). 3. Multiple small splenic artery aneurysms. 4. Aortic atherosclerosis (ICD10-I70.0). Coronary artery calcification. 5. Enlarged pulmonic trunk, indicative of pulmonary arterial hypertension.  CTAP 03/18/2023 Colonic diverticulosis, without radiographic evidence of diverticulitis or other acute findings.  Large stool burden noted; recommend clinical correlation for possible constipation.  GI Procedures and Studies   12/08/19 EGD/colonoscopy with Dr. Donnald Colonoscopy- Impression: Diverticulosis of the sigmoid colon. Three diminutive polyps in the cecum.  Biopsied. Two diminutive poylps in the transverse colon. Biopsied. The distal rectum and anal verge are  normal on the retroflexion view. No definite pathology seen to account for patients abdominal pain.  Path: Final microscopic diagnosis Small intestine, duodenum biopsy Focal incidental lymphangiectasia, otherwise bowel mucosa with no significant Pathologic changes.  Negative for features of celiac disease. Giardia Organisms not present. Stomach anntrum bx reactive gastropathy. H pylori organisms not present. large intestine-cecum poylp Tubular adenomas (2) polypoid colonic mucosa with lymphoid aggregate. negative for dysplasia. Large intestine- transverse colon, polyp. Tubular adenoma. sessile serrated adenoma/poylp.   Clinical Impression  It is my clinical impression that Cindy Schwartz is a 89 y.o. female with;  Chronic constipation Colonic diverticulosis History of colon polyps-tubular adenoma and sessile serrated adenoma GERD Esophageal dysmotility Bloating-borderline test for SIBO  Cindy Schwartz returns to the gastroenterology office for follow-up of multiple gastrointestinal issues as outlined above.  At the time of her last visit, I ordered an upper GI series to evaluate her history of reflux and abdominal discomfort.  This was aborted early due to concern for Cindy Schwartz aspiration although she denied active symptoms of this.  We discussed SLP evaluation and submitting an H. pylori stool specimen neither of which occurred.  She returns to the office today reporting that her symptoms are dramatically improved since a 12-day trip to [place].  Reports that during her trip to [place] she did not need to use Nexium  on a regular basis.  [place] returning to the [place]  she has now returned to taking Nexium  which is beneficial for managing her reflux.  The previous abdominal discomfort that she endorsed is also resolved.  Given that she is not having overt symptoms of aspiration we discussed deferring SLP evaluation.  Her records suggest she may have had a positive H. pylori serology in the past and we will  proceed with H. pylori stool testing.  From a lower GI perspective, she is no longer using MiraLAX or Chia seeds but has trialed a new probiotic that she thinks is helping her bowel movements.  She is  also incorporating vegetables into her diet and staying physically active.  SIBO breath test 11/2023 was negative.  She did not find benefit from a low FODMAP diet in the past.  If bloating recurs would consider testing for celiac disease and possibly empiric gluten-free diet if celiac testing is negative.   Plan  Continue OTC probiotic Continue MiraLAX as needed High-fiber diet for constipation-Cindy Schwartz may consider adding in Chia seeds for dietary management of constipation Consider celiac disease testing if bloating recurs. Obtain [employer] H. pylori stool test Continue Nexium  20 mg orally daily 20 to 30 minutes before meal -could consider adding an H2 blocker at night in the future if needed GERD diet and lifestyle modification No longer under colon polyp surveillance based upon age  Planned Follow Up 12 months  The Cindy Schwartz or caregiver verbalized understanding of the material covered, with no barriers to understanding. All questions were answered. Cindy Schwartz or caregiver is agreeable with the plan outlined above.    It was a pleasure to see Cindy Schwartz.  If you have any questions or concerns regarding this evaluation, do not hesitate to contact me.  Inocente Hausen, MD [facility]   I spent total of 30 minutes in both face-to-face (15 minutes interview) and non-face-to-face (15 minutes chart review, care coordination, documentation)  activities, excluding procedures performed, for the visit on the date of this encounter.

## 2024-09-05 ENCOUNTER — Encounter: Payer: Self-pay | Admitting: Pediatrics

## 2024-09-05 ENCOUNTER — Ambulatory Visit (INDEPENDENT_AMBULATORY_CARE_PROVIDER_SITE_OTHER): Admitting: Pediatrics

## 2024-09-05 VITALS — BP 118/64 | HR 54 | Ht 65.0 in | Wt 136.5 lb

## 2024-09-05 DIAGNOSIS — Z860101 Personal history of adenomatous and serrated colon polyps: Secondary | ICD-10-CM

## 2024-09-05 DIAGNOSIS — R140 Abdominal distension (gaseous): Secondary | ICD-10-CM | POA: Diagnosis not present

## 2024-09-05 DIAGNOSIS — K219 Gastro-esophageal reflux disease without esophagitis: Secondary | ICD-10-CM

## 2024-09-05 DIAGNOSIS — K5909 Other constipation: Secondary | ICD-10-CM

## 2024-09-05 DIAGNOSIS — K5900 Constipation, unspecified: Secondary | ICD-10-CM

## 2024-09-05 DIAGNOSIS — K224 Dyskinesia of esophagus: Secondary | ICD-10-CM | POA: Diagnosis not present

## 2024-09-05 DIAGNOSIS — K5730 Diverticulosis of large intestine without perforation or abscess without bleeding: Secondary | ICD-10-CM | POA: Diagnosis not present

## 2024-09-05 MED ORDER — ESOMEPRAZOLE MAGNESIUM 20 MG PO CPDR: 20.0000 mg | DELAYED_RELEASE_CAPSULE | Freq: Every day | ORAL | 3 refills | Status: DC

## 2024-09-05 NOTE — Patient Instructions (Signed)
 We have sent the following medications to your pharmacy for you to pick up at your convenience: Nexium  20 mg once daily.  Your provider has ordered [employer] stool testing for you. You have received a kit from our office today containing all necessary supplies to complete this test. Please carefully read the stool collection instructions provided in the kit before opening the accompanying materials. In addition, be sure there is a label providing your full name and date of birth on the puritan opti-swab tube that is supplied in the kit (if you do not see a label with this information on your test tube, please make us  aware before test collection!). After completing the test, you should secure the purtian tube into the specimen biohazard bag. The [facility] E-Req sheet (including date and time of specimen collection) should be placed into the outside pocket of the specimen biohazard bag and returned to the [facility] (basement floor of [facility]) within 3 days of collection. Please make sure to give the specimen to a staff member at the lab. DO NOT leave the specimen on the counter.   If the specimen date and time (can be found in the upper right boxed portion of the sheet) are not filled out on the E-Req sheet, the test will NOT be performed.    Thank you for entrusting me with your care and for choosing [facility], Dr. Inocente Hausen  _______________________________________________________  If your blood pressure at your visit was 140/90 or greater, please contact your primary care physician to follow up on this.  _______________________________________________________  If you are age 31 or older, your body mass index should be between 23-30. Your Body mass index is 22.71 kg/m. If this is out of the aforementioned range listed, please consider follow up with your Primary Care Provider.  If you are age 27 or younger, your body mass index should be  between 19-25. Your Body mass index is 22.71 kg/m. If this is out of the aformentioned range listed, please consider follow up with your Primary Care Provider.   ________________________________________________________  The [facility] providers would like to encourage you to use MYCHART to communicate with providers for non-urgent requests or questions.  Due to long hold times on the telephone, sending your provider a message by [facility] may be a faster and more efficient way to get a response.  Please allow 48 business hours for a response.  Please remember that this is for non-urgent requests.  _______________________________________________________  [facility] is using a team-based approach to care.  Your team is made up of your doctor and two to three APPS. Our APPS (Nurse Practitioners and Physician Assistants) work with your physician to ensure care continuity for you. They are fully qualified to address your health concerns and develop a treatment plan. They communicate directly with your gastroenterologist to care for you. Seeing the Advanced Practice Practitioners on your physician's team can help you by facilitating care more promptly, often allowing for earlier appointments, access to diagnostic testing, procedures, and other specialty referrals.

## 2024-09-05 NOTE — Progress Notes (Signed)
 "    Evaluation Performed:  Follow-up visit  Date:  09/19/2024   ID:  Cindy Schwartz, DOB 26-Mar-1934, MRN [id number]  PCP:  [name], MD  Cardiologist:  [name], MD   Electrophysiologist:  None   Chief Complaint:  Palpitations/ Dizziness  History of Present Illness:    89 y.o. with history of HTN, fusiform aortic root dilatation 4.1 cm.  Chest pain January 2016 with normal cardiac catheterization. Palpitations with monitor only showing PACs;/PVC;s  Breast cancer with mastectomy August 2018. Husband diagnosed with lung cancer at same time.   Has two grandson's one Lonni married living in [place] and one Audery has been in [place] and [place] cooking with wife Daughter in [place]     Having some chronic back pain Dr Jodie primary ordered plan films 07/31/20 showed chronic stable L1 compression deformity  Echo 02/24/23 EF 60-65% no significant valve dx CTA 06/18/24 stable aneurysm 4.2 cm  Myovue 06/27/24 normal no ischemia EF 89%   Husband of 63 years finally passes of ILD/Lung Cancer and pneumonia 05/14/20 She has been even more depressed   She has severe multifactorial spinal canal stenosis with cauda equina nerve root impingement L34 She had joint injection 07/07/22  Primary issue is arthritis in left thumb and back pain Having good luck with PT/OT  Had a nice trip to the [place] with her church in the fall.  Past Medical History:  Diagnosis Date   Altered bowel function 09/07/2024   Aneurysm of ascending aorta    a. 4.2-4.3cm in 2014.   Anxiety    Arthritis    Arthritis of both knees 09/10/2020   Breast cancer (HCC) 2018   right mastectomy   Cancer (HCC) 1980   ovarian cancer   Change in stool caliber 09/07/2024   Compression fracture of L1 lumbar vertebra (HCC)    COMPRESSION FRACTURE, THORACIC VERTEBRA 07/24/2010   Qualifier: Diagnosis of  By: [name], [name]    Constipation 09/07/2024   Diaphragmatic hernia 09/07/2024   Disorder of breast 09/07/2024    Dyspepsia 09/07/2024   Elevated BP Transient 09/2014   Encounter for long-term (current) use of medications 09/07/2024   Epidermal nevus of face 09/07/2024   Genetic testing 07/09/2017   Multi-Cancer panel (83 genes) @ [facility] - No pathogenic mutations detected   GERD (gastroesophageal reflux disease)    H/O bone density study    2015   H/O colonoscopy    History of left mastectomy 09/07/2024   History of ovarian cancer    November 1979   History of ovarian cancer 1979   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   History of right mastectomy 09/07/2024   Insomnia    Irregular heart beat    Laryngopharyngeal reflux 09/07/2024   Magnesium  deficiency 09/07/2024   Malignant neoplasm of ovary (HCC) 09/07/2024   Nevus of left upper arm 09/07/2024   Nevus of right upper arm 09/07/2024   Normal coronary arteries Cath - 09/2014   Osteoporosis    Raynaud's disease    Rosacea 09/07/2024   Weight loss 09/07/2024   Past Surgical History:  Procedure Laterality Date   ABDOMINAL HYSTERECTOMY  1979   APPENDECTOMY     BILATERAL SALPINGOOPHORECTOMY     BREAST LUMPECTOMY     COLONOSCOPY  02/2012   LEFT HEART CATHETERIZATION WITH CORONARY ANGIOGRAM N/A 10/17/2014   Procedure: LEFT HEART CATHETERIZATION WITH CORONARY ANGIOGRAM;  Surgeon: Debby DELENA Sor, MD;  Location: [facility] CATH LAB;  Service: Cardiovascular;  Laterality:  N/A;   MASTECTOMY Right 2018   no chemo no radiation no tamoxifen   ovarian ca s/p taj/bso  1979   SIMPLE MASTECTOMY WITH AXILLARY SENTINEL NODE BIOPSY Right 05/05/2017   Procedure: RIGHT TOTAL MASTECTOMY WITH RIGHT  AXILLARY SENTINEL NODE BIOPSY;  Surgeon: Ebbie Cough, MD;  Location: [facility] OR;  Service: General;  Laterality: Right;  PECTORAL BLOCK     Current Meds  Medication Sig   Ascorbic Acid (VITAMIN C) 1000 MG tablet Take 1,000 mg by mouth daily.   aspirin  EC (ASPIRIN  LOW DOSE) 81 MG tablet TAKE 1 TABLET DAILY (SWALLOW WHOLE)   Calcium   Carbonate (CALCIUM  500 PO) Take by mouth.   Cholecalciferol 25 MCG (1000 UT) capsule Take by mouth. (Patient taking differently: Take by mouth. Takes in the winter months)   COLLAGEN PO Take by mouth.   esomeprazole  (NEXIUM ) 20 MG capsule TAKE 1 CAPSULE DAILY   gabapentin  (NEURONTIN ) 100 MG capsule Take 100 mg by mouth 2 (two) times daily.   ketoconazole (NIZORAL) 2 % cream APPLY TOPICALLY TO EYEBROWS TWICE DAILY   MAGNESIUM  PO Take 300 mg by mouth.   meloxicam  (MOBIC ) 7.5 MG tablet Take 1 tablet (7.5 mg total) by mouth daily.   Probiotic Product (PROBIOTIC PO) Take 1 capsule by mouth daily.   rosuvastatin  (CRESTOR ) 5 MG tablet TAKE 1 TABLET DAILY   triamcinolone  cream (KENALOG ) 0.1 % Apply thin layer to affected area(s) left breast Externally Up to twice daily as needed; Duration: 30 days   valACYclovir  (VALTREX ) 500 MG tablet Take 1 tablet (500 mg total) by mouth 2 (two) times a week.     Allergies:   Chlorhexidine , Prednisone , and Chlorhexidine  gluconate   Social History   Tobacco Use   Smoking status: Never   Smokeless tobacco: Never  Vaping Use   Vaping status: Never Used  Substance Use Topics   Alcohol use: No   Drug use: No     Family Hx: The patient's family history includes Colon polyps in her brother; Esophageal cancer (age of onset: 85) in her cousin; Healthy in her brother; Hip fracture in her mother; Other in her father. There is no history of Colon cancer, Pancreatic cancer, or Stomach cancer.  ROS:   Please see the history of present illness.     All other systems reviewed and are negative.   Prior CV studies:   The following studies were reviewed today:  Echo 02/24/23  CTA 02/11/23   Labs/Other Tests and Data Reviewed:    EKG  SR RBBB no acute changes   Recent Labs: 02/01/2024: TSH 1.76 04/04/2024: ALT 18 06/18/2024: BUN 13; Creatinine, Ser 0.61; Hemoglobin 13.1; Platelets 244; Potassium 3.8; Sodium 131   Recent Lipid Panel Lab Results  Component Value  Date/Time   CHOL 129 10/13/2023 10:44 AM   TRIG 41.0 10/13/2023 10:44 AM   HDL 62.00 10/13/2023 10:44 AM   CHOLHDL 2 10/13/2023 10:44 AM   LDLCALC 59 10/13/2023 10:44 AM   LDLCALC 93 06/11/2020 09:32 AM   LDLDIRECT 64.0 01/08/2023 09:45 AM    Wt Readings from Last 3 Encounters:  09/19/24 135 lb 6.4 oz (61.4 kg)  09/07/24 137 lb 2 oz (62.2 kg)  09/05/24 136 lb 8 oz (61.9 kg)     Objective:    Vital Signs:  BP 134/68 (BP Location: Left Arm, Patient Position: Sitting)   Pulse 74   Ht 5' 6 (1.676 m)   Wt 135 lb 6.4 oz (61.4 kg)   LMP  (  LMP Unknown)   SpO2 97%   BMI 21.85 kg/m    Depressed  Well nourished, well developed female in no acute distress. Skin warm and dry No tachypnea Post right mastectomy No JVP elevation No edema 2/6 SEM  Plus 3 PT/DP  ASSESSMENT & PLAN:      1. Dizziness/ Lightheadedness:  -non cardiac benign holter 09/25/15 normal exam and no CAD at cath 2016    2. H/o PVCs/PAC:  -benign PRN Inderal   - Normal Myovue 06/27/24 with hyperdynamic EF 89%    3. Breast CA:   -Post right mastectomy Ebbie 05/05/17 F/U oncology    4. Stress:  -depression related to husbands death consoled f/u primary    5. Thoracic aortic aneurysm:  -followed by Dr. Lucas. 4.2 cm stable by CTA 06/18/24    6. Murmur: - no change on exam AV sclerosis    7. Cholesterol:   -LDL 56 labs done 12/11/20  continue low dose crestor     8. Cough:  - Cyclic sees pulmonary continue flonase  and anti reflux measures atrovent  was added Suspect pollen and allergies making things worse currently F/U Dr Katrinka  9. Lumbago:  spinal stenosis L34 post injection 07/07/22 f/u   Medication Adjustments/Labs and Tests Ordered: Current medicines are reviewed at length with the patient today.  Concerns regarding medicines are outlined above.  Tests Ordered:  None   Medication Changes: No orders of the defined types were placed in this encounter.   Disposition:  Follow up in a year    Signed, [name], MD  09/19/2024 9:19 AM    [facility] "

## 2024-09-07 ENCOUNTER — Ambulatory Visit: Admitting: Family

## 2024-09-07 ENCOUNTER — Telehealth: Payer: Self-pay

## 2024-09-07 ENCOUNTER — Encounter: Payer: Self-pay | Admitting: Family

## 2024-09-07 VITALS — BP 134/72 | HR 73 | Temp 97.9°F | Ht 65.0 in | Wt 137.1 lb

## 2024-09-07 DIAGNOSIS — R634 Abnormal weight loss: Secondary | ICD-10-CM | POA: Insufficient documentation

## 2024-09-07 DIAGNOSIS — R198 Other specified symptoms and signs involving the digestive system and abdomen: Secondary | ICD-10-CM | POA: Insufficient documentation

## 2024-09-07 DIAGNOSIS — R195 Other fecal abnormalities: Secondary | ICD-10-CM | POA: Insufficient documentation

## 2024-09-07 DIAGNOSIS — R1013 Epigastric pain: Secondary | ICD-10-CM | POA: Insufficient documentation

## 2024-09-07 DIAGNOSIS — E612 Magnesium deficiency: Secondary | ICD-10-CM | POA: Insufficient documentation

## 2024-09-07 DIAGNOSIS — N649 Disorder of breast, unspecified: Secondary | ICD-10-CM | POA: Insufficient documentation

## 2024-09-07 DIAGNOSIS — R053 Chronic cough: Secondary | ICD-10-CM | POA: Diagnosis not present

## 2024-09-07 DIAGNOSIS — K5900 Constipation, unspecified: Secondary | ICD-10-CM | POA: Insufficient documentation

## 2024-09-07 DIAGNOSIS — K219 Gastro-esophageal reflux disease without esophagitis: Secondary | ICD-10-CM | POA: Insufficient documentation

## 2024-09-07 DIAGNOSIS — C569 Malignant neoplasm of unspecified ovary: Secondary | ICD-10-CM | POA: Insufficient documentation

## 2024-09-07 DIAGNOSIS — J0110 Acute frontal sinusitis, unspecified: Secondary | ICD-10-CM

## 2024-09-07 DIAGNOSIS — U099 Post covid-19 condition, unspecified: Secondary | ICD-10-CM

## 2024-09-07 DIAGNOSIS — Z79899 Other long term (current) drug therapy: Secondary | ICD-10-CM | POA: Insufficient documentation

## 2024-09-07 DIAGNOSIS — D2261 Melanocytic nevi of right upper limb, including shoulder: Secondary | ICD-10-CM | POA: Insufficient documentation

## 2024-09-07 DIAGNOSIS — D2330 Other benign neoplasm of skin of unspecified part of face: Secondary | ICD-10-CM | POA: Insufficient documentation

## 2024-09-07 DIAGNOSIS — Z9012 Acquired absence of left breast and nipple: Secondary | ICD-10-CM | POA: Insufficient documentation

## 2024-09-07 DIAGNOSIS — L719 Rosacea, unspecified: Secondary | ICD-10-CM | POA: Insufficient documentation

## 2024-09-07 DIAGNOSIS — D2262 Melanocytic nevi of left upper limb, including shoulder: Secondary | ICD-10-CM | POA: Insufficient documentation

## 2024-09-07 DIAGNOSIS — K449 Diaphragmatic hernia without obstruction or gangrene: Secondary | ICD-10-CM | POA: Insufficient documentation

## 2024-09-07 DIAGNOSIS — Z9011 Acquired absence of right breast and nipple: Secondary | ICD-10-CM | POA: Insufficient documentation

## 2024-09-07 MED ORDER — DM-GUAIFENESIN ER 30-600 MG PO TB12: 1.0000 | ORAL_TABLET | Freq: Two times a day (BID) | ORAL | 0 refills | Status: DC

## 2024-09-07 MED ORDER — CEFDINIR 300 MG PO CAPS: 300.0000 mg | ORAL_CAPSULE | Freq: Two times a day (BID) | ORAL | 0 refills | Status: DC

## 2024-09-07 NOTE — Telephone Encounter (Unsigned)
 Copied from CRM #[id number]. Topic: Clinical - Prescription Issue >> Sep 07, 2024  3:23 PM Alexandria E wrote: Reason for CRM: Patient was seen today and provider sent in two medications for her. Patient stated the pharmacy did not have the order for dextromethorphan-guaiFENesin  (MUCINEX  DM) 30-600 MG 12hr tablet, so patient would like this clarified as she stated she will buy it over the counter.

## 2024-09-07 NOTE — Progress Notes (Signed)
 Patient ID: Cindy Schwartz, female    DOB: 02/06/1934, 89 y.o.   MRN: [id number]  Chief Complaint  Patient presents with   Nasal Congestion    Pt c/o nasal/chest congestion and post -nasal drip, present for 2 weeks.  Discussed the use of AI scribe software for clinical note transcription with the patient, who gave verbal consent to proceed.  History of Present Illness Cindy Schwartz is an 89 year old female who presents with persistent sinus congestion and chest heaviness.  For about a month she has had persistent sinus pressure with tearing in her eyes, worse when lying down, and pain in her upper teeth in the sinus area, along with chest heaviness described as a need to clear something from her chest. Saline spray, honey, humidifier, and nasal steroids (Flonase , Nasacort ) have not helped. She recently had COVID-19 and thinks this may have contributed. Her nasal mucus is clear and she denies ear pain or pressure. She has had similar symptoms in the past that responded to cefdinir . She has also tolerated azithromycin  previously. She has not yet tried Mucinex  for chest congestion and is worried about her lung health at her age.  Assessment & Plan Acute sinusitis Persistent sinus pressure and tearing for one month, exacerbated when lying down. Previous treatment with azithromycin  was ineffective for post-COVID cough symptoms. Nasal steroids like Flonase  or Nasacort  have been ineffective. Symptoms may be related to post-COVID effects. - Prescribed cefdinir  300mg  bid for potential bacterial sinusitis. - Prescribed generic Mucinex  DM bid to help loosen chest congestion. - Advised use of a humidifier at night to moisturize airways. - Discussed potential viral etiology and prolonged recovery time.  Post covid cough Persistent cough following COVID-19 infection, lasting over a month. No wheezing or congestion on examination. Symptoms may be residual effects of COVID-19, which can take 10 days to two weeks  to resolve. - Discussed potential prolonged recovery time for post-COVID symptoms. - Cefdinir  can provide some benefit if viral infection turning into bacterial. - Continue OTC treatments. - Increase water intake to 2 liters daily.   Subjective:    Outpatient Medications Prior to Visit  Medication Sig Dispense Refill   Ascorbic Acid (VITAMIN C) 1000 MG tablet Take 1,000 mg by mouth daily.     aspirin  EC (ASPIRIN  LOW DOSE) 81 MG tablet TAKE 1 TABLET DAILY (SWALLOW WHOLE) 90 tablet 3   Calcium  Carbonate (CALCIUM  500 PO) Take by mouth.     Cholecalciferol 25 MCG (1000 UT) capsule Take by mouth. (Patient taking differently: Take by mouth. Takes in the winter months)     COLLAGEN PO Take by mouth.     esomeprazole  (NEXIUM ) 20 MG capsule Take 1 capsule (20 mg total) by mouth daily at 12 noon. 90 capsule 3   gabapentin  (NEURONTIN ) 100 MG capsule Take 100 mg by mouth 2 (two) times daily.     ketoconazole (NIZORAL) 2 % cream APPLY TOPICALLY TO EYEBROWS TWICE DAILY     MAGNESIUM  PO Take 300 mg by mouth.     nitroGLYCERIN  (NITROSTAT ) 0.4 MG SL tablet Place 1 tablet (0.4 mg total) under the tongue every 5 (five) minutes as needed for chest pain. 25 tablet 3   Probiotic Product (PROBIOTIC PO) Take 1 capsule by mouth daily.     rosuvastatin  (CRESTOR ) 5 MG tablet TAKE 1 TABLET DAILY 90 tablet 3   triamcinolone  cream (KENALOG ) 0.1 % Apply thin layer to affected area(s) left breast Externally Up to twice daily as needed; Duration: 30 days  valACYclovir  (VALTREX ) 500 MG tablet Take 1 tablet (500 mg total) by mouth 2 (two) times a week. 26 tablet 3   meloxicam  (MOBIC ) 7.5 MG tablet Take 1 tablet (7.5 mg total) by mouth daily. (Patient not taking: Reported on 09/07/2024) 30 tablet 0   No facility-administered medications prior to visit.   Past Medical History:  Diagnosis Date   Altered bowel function 09/07/2024   Aneurysm of ascending aorta    a. 4.2-4.3cm in 2014.   Anxiety    Arthritis     Arthritis of both knees 09/10/2020   Breast cancer (HCC) 2018   right mastectomy   Cancer (HCC) 1980   ovarian cancer   Change in stool caliber 09/07/2024   Compression fracture of L1 lumbar vertebra (HCC)    COMPRESSION FRACTURE, THORACIC VERTEBRA 07/24/2010   Qualifier: Diagnosis of  By: [name], [name]    Constipation 09/07/2024   Diaphragmatic hernia 09/07/2024   Disorder of breast 09/07/2024   Dyspepsia 09/07/2024   Elevated BP Transient 09/2014   Encounter for long-term (current) use of medications 09/07/2024   Epidermal nevus of face 09/07/2024   Genetic testing 07/09/2017   Multi-Cancer panel (83 genes) @ [facility] - No pathogenic mutations detected   GERD (gastroesophageal reflux disease)    H/O bone density study    2015   H/O colonoscopy    History of left mastectomy 09/07/2024   History of ovarian cancer    November 1979   History of ovarian cancer 1979   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   History of right mastectomy 09/07/2024   Insomnia    Irregular heart beat    Laryngopharyngeal reflux 09/07/2024   Magnesium  deficiency 09/07/2024   Malignant neoplasm of ovary (HCC) 09/07/2024   Nevus of left upper arm 09/07/2024   Nevus of right upper arm 09/07/2024   Normal coronary arteries Cath - 09/2014   Osteoporosis    Raynaud's disease    Rosacea 09/07/2024   Weight loss 09/07/2024   Past Surgical History:  Procedure Laterality Date   ABDOMINAL HYSTERECTOMY  1979   APPENDECTOMY     BILATERAL SALPINGOOPHORECTOMY     BREAST LUMPECTOMY     COLONOSCOPY  02/2012   LEFT HEART CATHETERIZATION WITH CORONARY ANGIOGRAM N/A 10/17/2014   Procedure: LEFT HEART CATHETERIZATION WITH CORONARY ANGIOGRAM;  Surgeon: Debby DELENA Sor, MD;  Location: [facility] CATH LAB;  Service: Cardiovascular;  Laterality: N/A;   MASTECTOMY Right 2018   no chemo no radiation no tamoxifen   ovarian ca s/p taj/bso  1979   SIMPLE MASTECTOMY WITH AXILLARY SENTINEL NODE BIOPSY  Right 05/05/2017   Procedure: RIGHT TOTAL MASTECTOMY WITH RIGHT  AXILLARY SENTINEL NODE BIOPSY;  Surgeon: Ebbie Cough, MD;  Location: [facility] OR;  Service: General;  Laterality: Right;  PECTORAL BLOCK   Allergies[1]    Objective:    Physical Exam Vitals and nursing note reviewed.  Constitutional:      Appearance: Normal appearance. She is ill-appearing.     Interventions: Face mask in place.  HENT:     Right Ear: Tympanic membrane and ear canal normal.     Left Ear: Tympanic membrane and ear canal normal.     Nose:     Right Sinus: Frontal sinus tenderness present.     Left Sinus: Frontal sinus tenderness present.     Mouth/Throat:     Mouth: Mucous membranes are moist.     Pharynx: Posterior oropharyngeal erythema present. No pharyngeal swelling, oropharyngeal exudate or  uvula swelling.     Tonsils: No tonsillar exudate or tonsillar abscesses.  Cardiovascular:     Rate and Rhythm: Normal rate and regular rhythm.  Pulmonary:     Effort: Pulmonary effort is normal.     Breath sounds: Normal breath sounds.  Musculoskeletal:        General: Normal range of motion.  Lymphadenopathy:     Head:     Right side of head: No preauricular or posterior auricular adenopathy.     Left side of head: No preauricular or posterior auricular adenopathy.     Cervical: No cervical adenopathy.  Skin:    General: Skin is warm and dry.  Neurological:     Mental Status: She is alert.  Psychiatric:        Mood and Affect: Mood normal.        Behavior: Behavior normal.    BP 134/72   Pulse 73   Temp 97.9 F (36.6 C) (Temporal)   Ht 5' 5 (1.651 m)   Wt 137 lb 2 oz (62.2 kg)   LMP  (LMP Unknown)   SpO2 99%   BMI 22.82 kg/m  Wt Readings from Last 3 Encounters:  09/07/24 137 lb 2 oz (62.2 kg)  09/05/24 136 lb 8 oz (61.9 kg)  08/17/24 135 lb (61.2 kg)      Colisha Redler, NP     [1]  Allergies Allergen Reactions   Chlorhexidine  Dermatitis   Prednisone  Other (See Comments)     Fatigue and depressed mood on 7 day 40mg  x 3 days, 20 mg x 4 days from Dr. Katrinka- intolerance  prednisone   Fatigue and depressed mood on 7 day 40mg  x 3 days, 20 mg x 4 days from Dr. Katrinka- intolerance  prednisone     prednisone    Chlorhexidine  Gluconate Rash

## 2024-09-12 NOTE — Telephone Encounter (Signed)
 I returned patients call to clarify RX OTC, Patient verbalized understanding.

## 2024-09-17 ENCOUNTER — Other Ambulatory Visit: Payer: Self-pay | Admitting: Family Medicine

## 2024-09-19 ENCOUNTER — Ambulatory Visit: Attending: Cardiovascular Disease | Admitting: Cardiovascular Disease

## 2024-09-19 ENCOUNTER — Ambulatory Visit: Admitting: Physical Medicine and Rehabilitation

## 2024-09-19 VITALS — BP 134/68 | HR 74 | Ht 66.0 in | Wt 135.4 lb

## 2024-09-19 DIAGNOSIS — E785 Hyperlipidemia, unspecified: Secondary | ICD-10-CM

## 2024-09-19 DIAGNOSIS — R011 Cardiac murmur, unspecified: Secondary | ICD-10-CM

## 2024-09-19 DIAGNOSIS — I712 Thoracic aortic aneurysm, without rupture, unspecified: Secondary | ICD-10-CM | POA: Diagnosis not present

## 2024-09-19 DIAGNOSIS — R079 Chest pain, unspecified: Secondary | ICD-10-CM

## 2024-09-19 NOTE — Patient Instructions (Signed)
 Medication Instructions:  Your physician recommends that you continue on your current medications as directed. Please refer to the Current Medication list given to you today.  *If you need a refill on your cardiac medications before your next appointment, please call your pharmacy*  Lab Work: none If you have labs (blood work) drawn today and your tests are completely normal, you will receive your results only by: MyChart Message (if you have MyChart) OR A paper copy in the mail If you have any lab test that is abnormal or we need to change your treatment, we will call you to review the results.  Testing/Procedures: none  Follow-Up: At [facility], you and your health needs are our priority.  As part of our continuing mission to provide you with exceptional heart care, our providers are all part of one team.  This team includes your primary Cardiologist (physician) and Advanced Practice Providers or APPs (Physician Assistants and Nurse Practitioners) who all work together to provide you with the care you need, when you need it.  Your next appointment:   1 year  Provider:   Dr. Delford  We recommend signing up for the patient portal called MyChart.  Sign up information is provided on this After Visit Summary.  MyChart is used to connect with patients for Virtual Visits (Telemedicine).  Patients are able to view lab/test results, encounter notes, upcoming appointments, etc.  Non-urgent messages can be sent to your provider as well.   To learn more about what you can do with MyChart, go to [email or web address].   Other Instructions none

## 2024-09-28 ENCOUNTER — Telehealth: Payer: Self-pay

## 2024-09-28 NOTE — Telephone Encounter (Signed)
-----   Message from Inocente Hausen, MD sent at 09/26/2024 12:52 PM EST ----- Regarding: Negative H. pylori test Ms. Epple does not have a MyChart account to release results to.  Please contact her to let her know her H. pylori stool test was negative.  No further follow-up is needed regarding H. pylori at this time.  Thanks,  Inocente

## 2024-09-28 NOTE — Telephone Encounter (Signed)
 Left detailed message on pts voicemail regarding results per Dr. Suzann.

## 2024-09-29 ENCOUNTER — Telehealth: Payer: Self-pay | Admitting: Physical Medicine and Rehabilitation

## 2024-09-29 NOTE — Telephone Encounter (Signed)
 Pt called wanting to make an apt for an injection.Call back number is [phone]

## 2024-09-29 NOTE — Telephone Encounter (Signed)
 Patient advising she is still very undecided, will call when ready

## 2024-10-03 ENCOUNTER — Encounter: Payer: Self-pay | Admitting: Pediatrics

## 2024-10-04 ENCOUNTER — Ambulatory Visit: Admitting: Podiatry

## 2024-10-04 ENCOUNTER — Encounter: Payer: Self-pay | Admitting: Podiatry

## 2024-10-04 DIAGNOSIS — M79674 Pain in right toe(s): Secondary | ICD-10-CM | POA: Diagnosis not present

## 2024-10-04 DIAGNOSIS — M79675 Pain in left toe(s): Secondary | ICD-10-CM

## 2024-10-04 DIAGNOSIS — B351 Tinea unguium: Secondary | ICD-10-CM | POA: Diagnosis not present

## 2024-10-04 DIAGNOSIS — I7300 Raynaud's syndrome without gangrene: Secondary | ICD-10-CM

## 2024-10-06 ENCOUNTER — Ambulatory Visit: Payer: Medicare Other

## 2024-10-06 VITALS — Ht 65.0 in | Wt 135.0 lb

## 2024-10-06 DIAGNOSIS — Z0000 Encounter for general adult medical examination without abnormal findings: Secondary | ICD-10-CM | POA: Diagnosis not present

## 2024-10-06 NOTE — Progress Notes (Signed)
 "  Chief Complaint  Patient presents with   Medicare Wellness     Subjective:   Cindy Schwartz is a 89 y.o. female who presents for a Medicare Annual Wellness Visit.  Visit info / Clinical Intake: Medicare Wellness Visit Type:: Subsequent Annual Wellness Visit Persons participating in visit and providing information:: patient Medicare Wellness Visit Mode:: Telephone If telephone:: video declined Since this visit was completed virtually, some vitals may be partially provided or unavailable. Missing vitals are due to the limitations of the virtual format.: Unable to obtain vitals - no equipment If Telephone or Video please confirm:: I connected with patient using audio/video enable telemedicine. I verified patient identity with two identifiers, discussed telehealth limitations, and patient agreed to proceed. Patient Location:: home Provider Location:: office Interpreter Needed?: No Pre-visit prep was completed: yes AWV questionnaire completed by patient prior to visit?: no Living arrangements:: (!) lives alone Patient's Overall Health Status Rating: good Typical amount of pain: some Does pain affect daily life?: no Are you currently prescribed opioids?: no  Dietary Habits and Nutritional Risks How many meals a day?: 3 Eats fruit and vegetables daily?: yes Most meals are obtained by: preparing own meals In the last 2 weeks, have you had any of the following?: none Diabetic:: no  Functional Status Activities of Daily Living (to include ambulation/medication): Independent Ambulation: Independent with device- listed below Home Assistive Devices/Equipment: Eyeglasses Medication Administration: Independent Home Management (perform basic housework or laundry): Independent Manage your own finances?: yes Primary transportation is: driving Concerns about vision?: no *vision screening is required for WTM* Concerns about hearing?: (!) yes (slight HOH) Uses hearing aids?: no Hear whispered  voice?: yes  Fall Screening Falls in the past year?: 0 Number of falls in past year: 0 Was there an injury with Fall?: 0 Fall Risk Category Calculator: 0 Patient Fall Risk Level: Low Fall Risk  Fall Risk Patient at Risk for Falls Due to: Impaired balance/gait Fall risk Follow up: Falls evaluation completed  Home and Transportation Safety: All rugs have non-skid backing?: N/A, no rugs All stairs or steps have railings?: yes Grab bars in the bathtub or shower?: (!) no Have non-skid surface in bathtub or shower?: (!) no Good home lighting?: yes Regular seat belt use?: yes Hospital stays in the last year:: no  Cognitive Assessment Difficulty concentrating, remembering, or making decisions? : no Will 6CIT or Mini Cog be Completed: yes What year is it?: 0 points What month is it?: 0 points Give patient an address phrase to remember (5 components): [address] [place]  About what time is it?: 0 points Count backwards from 20 to 1: 0 points Say the months of the year in reverse: 2 points Repeat the address phrase from earlier: 0 points 6 CIT Score: 2 points  Advance Directives (For Healthcare) Does Patient Have a Medical Advance Directive?: Yes Does patient want to make changes to medical advance directive?: No - Patient declined Type of Advance Directive: Healthcare Power of Attorney Copy of Healthcare Power of Attorney in Chart?: No - copy requested Copy of Living Will in Chart?: No - copy requested  Reviewed/Updated  Reviewed/Updated: Reviewed All (Medical, Surgical, Family, Medications, Allergies, Care Teams, Patient Goals)    Allergies (verified) Chlorhexidine , Prednisone , and Chlorhexidine  gluconate   Current Medications (verified) Outpatient Encounter Medications as of 10/06/2024  Medication Sig   Ascorbic Acid (VITAMIN C) 1000 MG tablet Take 1,000 mg by mouth daily.   aspirin  EC (ASPIRIN  LOW DOSE) 81 MG tablet TAKE 1 TABLET  DAILY (SWALLOW WHOLE)   Calcium   Carbonate (CALCIUM  500 PO) Take by mouth.   Cholecalciferol 25 MCG (1000 UT) capsule Take by mouth. (Patient taking differently: Take by mouth. Takes in the winter months)   esomeprazole  (NEXIUM ) 20 MG capsule TAKE 1 CAPSULE DAILY   gabapentin  (NEURONTIN ) 100 MG capsule Take 100 mg by mouth 2 (two) times daily.   MAGNESIUM  PO Take 300 mg by mouth.   meloxicam  (MOBIC ) 7.5 MG tablet Take 1 tablet (7.5 mg total) by mouth daily.   nitroGLYCERIN  (NITROSTAT ) 0.4 MG SL tablet Place 1 tablet (0.4 mg total) under the tongue every 5 (five) minutes as needed for chest pain.   Probiotic Product (PROBIOTIC PO) Take 1 capsule by mouth daily.   rosuvastatin  (CRESTOR ) 5 MG tablet TAKE 1 TABLET DAILY   triamcinolone  cream (KENALOG ) 0.1 % Apply thin layer to affected area(s) left breast Externally Up to twice daily as needed; Duration: 30 days   valACYclovir  (VALTREX ) 500 MG tablet Take 1 tablet (500 mg total) by mouth 2 (two) times a week.   [DISCONTINUED] cefdinir  (OMNICEF ) 300 MG capsule Take 1 capsule (300 mg total) by mouth 2 (two) times daily after a meal.   [DISCONTINUED] COLLAGEN PO Take by mouth.   [DISCONTINUED] dextromethorphan-guaiFENesin  (MUCINEX  DM) 30-600 MG 12hr tablet Take 1 tablet by mouth 2 (two) times daily.   [DISCONTINUED] ketoconazole (NIZORAL) 2 % cream APPLY TOPICALLY TO EYEBROWS TWICE DAILY   No facility-administered encounter medications on file as of 10/06/2024.    History: Past Medical History:  Diagnosis Date   Altered bowel function 09/07/2024   Aneurysm of ascending aorta    a. 4.2-4.3cm in 2014.   Anxiety    Arthritis    Arthritis of both knees 09/10/2020   Breast cancer (HCC) 2018   right mastectomy   Cancer (HCC) 1980   ovarian cancer   Change in stool caliber 09/07/2024   Compression fracture of L1 lumbar vertebra (HCC)    COMPRESSION FRACTURE, THORACIC VERTEBRA 07/24/2010   Qualifier: Diagnosis of  By: [name], [name]    Constipation 09/07/2024    Diaphragmatic hernia 09/07/2024   Disorder of breast 09/07/2024   Dyspepsia 09/07/2024   Elevated BP Transient 09/2014   Encounter for long-term (current) use of medications 09/07/2024   Epidermal nevus of face 09/07/2024   Genetic testing 07/09/2017   Multi-Cancer panel (83 genes) @ [facility] - No pathogenic mutations detected   GERD (gastroesophageal reflux disease)    H/O bone density study    2015   H/O colonoscopy    History of left mastectomy 09/07/2024   History of ovarian cancer    November 1979   History of ovarian cancer 1979   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   History of right mastectomy 09/07/2024   Insomnia    Irregular heart beat    Laryngopharyngeal reflux 09/07/2024   Magnesium  deficiency 09/07/2024   Malignant neoplasm of ovary (HCC) 09/07/2024   Nevus of left upper arm 09/07/2024   Nevus of right upper arm 09/07/2024   Normal coronary arteries Cath - 09/2014   Osteoporosis    Raynaud's disease    Rosacea 09/07/2024   Weight loss 09/07/2024   Past Surgical History:  Procedure Laterality Date   ABDOMINAL HYSTERECTOMY  1979   APPENDECTOMY     BILATERAL SALPINGOOPHORECTOMY     BREAST LUMPECTOMY     COLONOSCOPY  02/2012   LEFT HEART CATHETERIZATION WITH CORONARY ANGIOGRAM N/A 10/17/2014   Procedure: LEFT  HEART CATHETERIZATION WITH CORONARY ANGIOGRAM;  Surgeon: Debby DELENA Sor, MD;  Location: [facility] CATH LAB;  Service: Cardiovascular;  Laterality: N/A;   MASTECTOMY Right 2018   no chemo no radiation no tamoxifen   ovarian ca s/p taj/bso  1979   SIMPLE MASTECTOMY WITH AXILLARY SENTINEL NODE BIOPSY Right 05/05/2017   Procedure: RIGHT TOTAL MASTECTOMY WITH RIGHT  AXILLARY SENTINEL NODE BIOPSY;  Surgeon: Ebbie Cough, MD;  Location: [facility] OR;  Service: General;  Laterality: Right;  PECTORAL BLOCK   Family History  Problem Relation Age of Onset   Hip fracture Mother    Other Father        lived to 71   Esophageal cancer Cousin 46        mat first cousin related through uncle   Healthy Brother    Colon polyps Brother    Colon cancer Neg Hx    Pancreatic cancer Neg Hx    Stomach cancer Neg Hx    Social History   Occupational History   Occupation: retired    Associate Professor: RETIRED  Tobacco Use   Smoking status: Never   Smokeless tobacco: Never  Vaping Use   Vaping status: Never Used  Substance and Sexual Activity   Alcohol use: No   Drug use: No   Sexual activity: Not on file   Tobacco Counseling Counseling given: Not Answered  SDOH Screenings   Food Insecurity: No Food Insecurity (10/06/2024)  Housing: Low Risk (10/06/2024)  Transportation Needs: No Transportation Needs (10/06/2024)  Utilities: Not At Risk (10/06/2024)  Alcohol Screen: Low Risk (10/06/2024)  Depression (PHQ2-9): Low Risk (10/06/2024)  Financial Resource Strain: Low Risk (10/06/2023)  Physical Activity: Insufficiently Active (10/06/2024)  Social Connections: Moderately Integrated (10/06/2024)  Stress: No Stress Concern Present (10/06/2024)  Tobacco Use: Low Risk (10/06/2024)  Health Literacy: Adequate Health Literacy (10/06/2024)   See flowsheets for full screening details  Depression Screen PHQ 2 & 9 Depression Scale- Over the past 2 weeks, how often have you been bothered by any of the following problems? Little interest or pleasure in doing things: 0 Feeling down, depressed, or hopeless (PHQ Adolescent also includes...irritable): 0 PHQ-2 Total Score: 0 Trouble falling or staying asleep, or sleeping too much: 0 Feeling tired or having little energy: 0 Poor appetite or overeating (PHQ Adolescent also includes...weight loss): 0 Feeling bad about yourself - or that you are a failure or have let yourself or your family down: 0 Trouble concentrating on things, such as reading the newspaper or watching television (PHQ Adolescent also includes...like school work): 0 Moving or speaking so slowly that other people could have noticed. Or the opposite -  being so fidgety or restless that you have been moving around a lot more than usual: 0 Thoughts that you would be better off dead, or of hurting yourself in some way: 0 PHQ-9 Total Score: 0 If you checked off any problems, how difficult have these problems made it for you to do your work, take care of things at home, or get along with other people?: Not difficult at all     Goals Addressed               This Visit's Progress     get stronger and exercise more (pt-stated)        Get stronger and exercise more              Objective:    Today's Vitals   10/06/24 0926  Weight: 135 lb (61.2 kg)  Height: 5' 5 (1.651 m)   Body mass index is 22.47 kg/m.  Hearing/Vision screen Hearing Screening - Comments:: Pt stated slight HO no hearing aids  Vision Screening - Comments:: Wears rx glasses - up to date with routine eye exams with [facility] ophthalmology  Immunizations and Health Maintenance Health Maintenance  Topic Date Due   Zoster Vaccines- Shingrix (1 of 2) 12/06/2024 (Originally 12/16/1952)   Influenza Vaccine  12/20/2024 (Originally 04/22/2024)   Pneumococcal Vaccine: 50+ Years (2 of 2 - PCV) 09/07/2025 (Originally 11/05/2019)   Mammogram  01/28/2025   Medicare Annual Wellness (AWV)  10/06/2025   DTaP/Tdap/Td (3 - Td or Tdap) 06/04/2030   Bone Density Scan  Completed   Meningococcal B Vaccine  Aged Out   COVID-19 Vaccine  Discontinued        Assessment/Plan:  This is a routine wellness examination for Toby.  Patient Care Team: [name], MD as PCP - General (Family Medicine) Delford Maude BROCKS, MD as PCP - Cardiology (Cardiology) Donnald Charleston, MD as Consulting Physician (Gastroenterology) [place], PA-C as Physician Assistant (Physician Assistant) Maurilio Camellia PARAS, MD as Consulting Physician (Allergy  and Immunology) Rosan Credit, MD as Consulting Physician (Ophthalmology)  I have personally reviewed and noted the following in the patients  chart:   Medical and social history Use of alcohol, tobacco or illicit drugs  Current medications and supplements including opioid prescriptions. Functional ability and status Nutritional status Physical activity Advanced directives List of other physicians Hospitalizations, surgeries, and ER visits in previous 12 months Vitals Screenings to include cognitive, depression, and falls Referrals and appointments  No orders of the defined types were placed in this encounter.  In addition, I have reviewed and discussed with patient certain preventive protocols, quality metrics, and best practice recommendations. A written personalized care plan for preventive services as well as general preventive health recommendations were provided to patient.   [place] VEAR Haws, LPN   8/84/7973   Return in about 1 year (around 10/09/2025).  After Visit Summary: (Declined) Due to this being a telephonic visit, with patients personalized plan was offered to patient but patient Declined AVS at this time   Nurse Notes: HM Addressed: Vaccines Due: and discussed   "

## 2024-10-06 NOTE — Patient Instructions (Signed)
 Cindy Schwartz,  Thank you for taking the time for your Medicare Wellness Visit. I appreciate your continued commitment to your health goals. Please review the care plan we discussed, and feel free to reach out if I can assist you further.  Please note that Annual Wellness Visits do not include a physical exam. Some assessments may be limited, especially if the visit was conducted virtually. If needed, we may recommend an in-person follow-up with your provider.  Ongoing Care Seeing your primary care provider every 3 to 6 months helps us  monitor your health and provide consistent, personalized care.   Referrals If a referral was made during today's visit and you haven't received any updates within two weeks, please contact the referred provider directly to check on the status.  Recommended Screenings:  Health Maintenance  Topic Date Due   Zoster (Shingles) Vaccine (1 of 2) 12/06/2024*   Flu Shot  12/20/2024*   Pneumococcal Vaccine for age over 44 (2 of 2 - PCV) 09/07/2025*   Breast Cancer Screening  01/28/2025   Medicare Annual Wellness Visit  10/06/2025   DTaP/Tdap/Td vaccine (3 - Td or Tdap) 06/04/2030   Osteoporosis screening with Bone Density Scan  Completed   Meningitis B Vaccine  Aged Out   COVID-19 Vaccine  Discontinued  *Topic was postponed. The date shown is not the original due date.       10/06/2024    9:31 AM  Advanced Directives  Does Patient Have a Medical Advance Directive? Yes  Type of Advance Directive Healthcare Power of Attorney  Copy of Healthcare Power of Attorney in Chart? No - copy requested    Vision: Annual vision screenings are recommended for early detection of glaucoma, cataracts, and diabetic retinopathy. These exams can also reveal signs of chronic conditions such as diabetes and high blood pressure.  Dental: Annual dental screenings help detect early signs of oral cancer, gum disease, and other conditions linked to overall health, including heart disease  and diabetes.  Please see the attached documents for additional preventive care recommendations.

## 2024-10-10 ENCOUNTER — Encounter: Payer: Self-pay | Admitting: Podiatry

## 2024-10-10 ENCOUNTER — Telehealth: Payer: Self-pay | Admitting: Physical Medicine and Rehabilitation

## 2024-10-10 NOTE — Telephone Encounter (Signed)
 Pt called stating waiting for call for referral with [facility]. Sent 08/24/24. Please call pt at [phone].

## 2024-10-10 NOTE — Progress Notes (Signed)
 "  Subjective:  Patient ID: Cindy Schwartz, female    DOB: 17-Jul-1934,  MRN: [id number]  Cindy Schwartz presents to clinic today for at risk foot care. Patient has h/o Raynaud's disease and painful thick toenails that are difficult to trim. Pain interferes with ambulation. Aggravating factors include wearing enclosed shoe gear. Pain is relieved with periodic professional debridement.  Chief Complaint  Patient presents with   RFC     RFC Non diabetic toenail trim. LOV with PCP 05/10/24.   New problem(s): None.   PCP is [name], MD.  Allergies[1]  Review of Systems: Negative except as noted in the HPI.  Objective: No changes noted in today's physical examination. There were no vitals filed for this visit. Cindy Schwartz is a pleasant 89 y.o. female WD, WN in NAD. AAO x 3.  Vascular Examination: Palpable pedal pulses. CFT immediate b/l. Pedal hair present. No edema. No pain with calf compression b/l. Skin temperature gradient WNL b/l. No varicosities noted. No cyanosis or clubbing noted.  Neurological Examination: Sensation grossly intact b/l with 10 gram monofilament. Vibratory sensation intact b/l.  Dermatological Examination: Pedal skin with normal turgor, texture and tone b/l. No open wounds nor interdigital macerations noted.   Toenails 2-5 b/l well maintained with adequate length. No erythema, no edema, no drainage, no fluctuance. Incurvated nailplate both borders of left hallux and both borders of right hallux.  Nail border hypertrophy absent. There is tenderness to palpation. Sign(s) of infection: no clinical signs of infection noted on examination today..  Minimal HKT submet head 2 left foot and submet head 2 right foot. No erythema, no edema, no drainage, no fluctuance.  Musculoskeletal Examination: Muscle strength 5/5 to b/l LE.  No pain, crepitus noted b/l. No gross pedal deformities. Patient ambulates independently without assistive aids.   Radiographs:  None  Assessment/Plan: 1. Pain due to onychomycosis of toenails of both feet   2. Raynaud's disease without gangrene   Consent given for treatment. Patient examined. All patient's and/or POA's questions/concerns addressed on today's visit. Mycotic toenails 2-5 b/l debrided in length and girth without incident.   -No invasive procedure(s) performed. Offending nail border debrided and curretaged both borders of left hallux and both borders of right hallux utilizing sterile nail nipper and currette. Border(s) cleansed with alcohol and triple antibiotic ointment applied. Patient/POA/Caregiver/Facility instructed to apply Neosporin Cream  to bilateral great toes once daily for 7 days. Call office if there are any concerns. -Patient/POA to call should there be question/concern in the interim.  Return in about 3 months (around 01/02/2025).  Delon LITTIE Merlin, DPM      [facility] [place] LOCATION: [address], [place] [address]                   Office [phone]   [place] LOCATION: [address] [place], [place] [address] Office [phone]     [1]  Allergies Allergen Reactions   Chlorhexidine  Dermatitis   Prednisone  Other (See Comments)    Fatigue and depressed mood on 7 day 40mg  x 3 days, 20 mg x 4 days from Dr. Katrinka- intolerance  prednisone   Fatigue and depressed mood on 7 day 40mg  x 3 days, 20 mg  x 4 days from Dr. Katrinka- intolerance  prednisone     prednisone    Chlorhexidine  Gluconate Rash   "

## 2024-10-11 ENCOUNTER — Telehealth: Payer: Self-pay

## 2024-10-11 ENCOUNTER — Other Ambulatory Visit: Payer: Self-pay | Admitting: Physical Medicine and Rehabilitation

## 2024-10-11 MED ORDER — DIAZEPAM 5 MG PO TABS: ORAL_TABLET | ORAL | 0 refills | Status: AC

## 2024-10-11 NOTE — Telephone Encounter (Signed)
 Pre Procedural Valium ( [facility] [employer])

## 2024-10-21 ENCOUNTER — Telehealth: Payer: Self-pay | Admitting: Pediatrics

## 2024-10-21 NOTE — Telephone Encounter (Signed)
 Spoke with patient. Patient had been having regular BMs until this week.  Having a hard time going. Discussed increasing fluids to at least 48-64 ounces of water per day. Add chia seeds back to her diet as she was doing before. Ok to restart probiotic. Increase activity as able. Instructed how to do bowel purge-6-8 capfuls of MiraLax in 16 ounces liquid. Drink plenty of liquids after MIralax. Expect results in 1-6 hours. Advised to seek emergent care if she has increased abdominal pain, bloating, nausea/vomiting. Patient verbalized understanding.

## 2024-10-21 NOTE — Telephone Encounter (Signed)
 Patient called stating she has been constipated for about a week. States she has tried different methods but nothing has worked. Patient is requesting a call to discuss other recommendations. Please advise, thank you

## 2024-10-27 ENCOUNTER — Other Ambulatory Visit: Payer: Self-pay

## 2024-10-27 ENCOUNTER — Ambulatory Visit: Admitting: Physical Medicine and Rehabilitation

## 2024-10-27 VITALS — BP 154/87 | HR 75

## 2024-10-27 DIAGNOSIS — M5416 Radiculopathy, lumbar region: Secondary | ICD-10-CM

## 2024-10-27 MED ORDER — METHYLPREDNISOLONE ACETATE 40 MG/ML IJ SUSP
40.0000 mg | Freq: Once | INTRAMUSCULAR | Status: AC
  Administered 2024-10-27: 40 mg

## 2024-10-27 NOTE — Progress Notes (Unsigned)
 Pain Scale   Average Pain 5 Patient advising she has chronic lower back pain that radiates bilaterally to legs pain comes and goes when walking and sitting.        +Driver, -BT, -Dye Allergies.

## 2024-11-28 ENCOUNTER — Encounter: Admitting: Family Medicine

## 2025-01-17 ENCOUNTER — Ambulatory Visit: Admitting: Podiatry

## 2025-10-10 ENCOUNTER — Ambulatory Visit
